# Patient Record
Sex: Female | Born: 1950 | Race: White | Hispanic: No | Smoking: Former smoker
Health system: Southern US, Community
[De-identification: ages and names within clinical notes are randomized; demographics above are authoritative.]

## PROBLEM LIST (undated history)

## (undated) DIAGNOSIS — I951 Orthostatic hypotension: Secondary | ICD-10-CM

## (undated) DIAGNOSIS — F32A Depression, unspecified: Secondary | ICD-10-CM

## (undated) DIAGNOSIS — E785 Hyperlipidemia, unspecified: Secondary | ICD-10-CM

## (undated) DIAGNOSIS — M810 Age-related osteoporosis without current pathological fracture: Secondary | ICD-10-CM

## (undated) DIAGNOSIS — K219 Gastro-esophageal reflux disease without esophagitis: Secondary | ICD-10-CM

## (undated) DIAGNOSIS — I700 Atherosclerosis of aorta: Secondary | ICD-10-CM

## (undated) DIAGNOSIS — D649 Anemia, unspecified: Secondary | ICD-10-CM

## (undated) DIAGNOSIS — F329 Major depressive disorder, single episode, unspecified: Secondary | ICD-10-CM

## (undated) DIAGNOSIS — I779 Disorder of arteries and arterioles, unspecified: Secondary | ICD-10-CM

## (undated) DIAGNOSIS — I10 Essential (primary) hypertension: Secondary | ICD-10-CM

## (undated) DIAGNOSIS — F419 Anxiety disorder, unspecified: Secondary | ICD-10-CM

## (undated) HISTORY — DX: Disorder of arteries and arterioles, unspecified: I77.9

## (undated) HISTORY — DX: Major depressive disorder, single episode, unspecified: F32.9

## (undated) HISTORY — DX: Anxiety disorder, unspecified: F41.9

## (undated) HISTORY — DX: Gastro-esophageal reflux disease without esophagitis: K21.9

## (undated) HISTORY — DX: Essential (primary) hypertension: I10

## (undated) HISTORY — PX: FOOT SURGERY: SHX648

## (undated) HISTORY — PX: CHOLECYSTECTOMY: SHX55

## (undated) HISTORY — DX: Age-related osteoporosis without current pathological fracture: M81.0

## (undated) HISTORY — DX: Atherosclerosis of aorta: I70.0

## (undated) HISTORY — PX: ABDOMINAL HYSTERECTOMY: SHX81

## (undated) HISTORY — DX: Anemia, unspecified: D64.9

## (undated) HISTORY — DX: Hyperlipidemia, unspecified: E78.5

## (undated) HISTORY — DX: Depression, unspecified: F32.A

## (undated) HISTORY — PX: BACK SURGERY: SHX140

## (undated) HISTORY — DX: Orthostatic hypotension: I95.1

---

## 1980-12-20 HISTORY — PX: TRACHEOSTOMY TUBE PLACEMENT: SHX814

## 1998-02-17 ENCOUNTER — Encounter: Admission: RE | Admit: 1998-02-17 | Discharge: 1998-05-18 | Payer: Self-pay | Admitting: Podiatry

## 1998-03-20 ENCOUNTER — Encounter: Admission: RE | Admit: 1998-03-20 | Discharge: 1998-06-18 | Payer: Self-pay | Admitting: Podiatry

## 1999-11-24 ENCOUNTER — Encounter: Payer: Self-pay | Admitting: Gynecology

## 1999-11-24 ENCOUNTER — Encounter: Admission: RE | Admit: 1999-11-24 | Discharge: 1999-11-24 | Payer: Self-pay | Admitting: Gynecology

## 2000-10-18 ENCOUNTER — Other Ambulatory Visit: Admission: RE | Admit: 2000-10-18 | Discharge: 2000-10-18 | Payer: Self-pay | Admitting: Gynecology

## 2001-01-06 ENCOUNTER — Encounter: Admission: RE | Admit: 2001-01-06 | Discharge: 2001-03-09 | Payer: Self-pay | Admitting: Family Medicine

## 2001-10-09 ENCOUNTER — Encounter: Admission: RE | Admit: 2001-10-09 | Discharge: 2001-10-09 | Payer: Self-pay | Admitting: Gynecology

## 2001-10-09 ENCOUNTER — Encounter: Payer: Self-pay | Admitting: Gynecology

## 2001-12-01 ENCOUNTER — Other Ambulatory Visit: Admission: RE | Admit: 2001-12-01 | Discharge: 2001-12-01 | Payer: Self-pay | Admitting: Gynecology

## 2003-05-08 ENCOUNTER — Other Ambulatory Visit: Admission: RE | Admit: 2003-05-08 | Discharge: 2003-05-08 | Payer: Self-pay | Admitting: Gynecology

## 2004-05-20 ENCOUNTER — Other Ambulatory Visit: Admission: RE | Admit: 2004-05-20 | Discharge: 2004-05-20 | Payer: Self-pay | Admitting: Gynecology

## 2004-10-22 ENCOUNTER — Ambulatory Visit: Payer: Self-pay | Admitting: Family Medicine

## 2005-03-31 ENCOUNTER — Ambulatory Visit: Payer: Self-pay | Admitting: Family Medicine

## 2005-04-28 ENCOUNTER — Ambulatory Visit: Payer: Self-pay | Admitting: Family Medicine

## 2005-04-29 ENCOUNTER — Ambulatory Visit (HOSPITAL_COMMUNITY): Admission: RE | Admit: 2005-04-29 | Discharge: 2005-04-29 | Payer: Self-pay | Admitting: Family Medicine

## 2005-05-12 ENCOUNTER — Ambulatory Visit (HOSPITAL_COMMUNITY): Admission: RE | Admit: 2005-05-12 | Discharge: 2005-05-12 | Payer: Self-pay | Admitting: Neurosurgery

## 2005-06-14 ENCOUNTER — Ambulatory Visit: Payer: Self-pay | Admitting: Family Medicine

## 2005-06-24 ENCOUNTER — Inpatient Hospital Stay (HOSPITAL_COMMUNITY): Admission: RE | Admit: 2005-06-24 | Discharge: 2005-06-25 | Payer: Self-pay | Admitting: Neurosurgery

## 2005-10-05 ENCOUNTER — Other Ambulatory Visit: Admission: RE | Admit: 2005-10-05 | Discharge: 2005-10-05 | Payer: Self-pay | Admitting: Gynecology

## 2006-05-30 ENCOUNTER — Ambulatory Visit: Payer: Self-pay | Admitting: Family Medicine

## 2006-08-04 ENCOUNTER — Ambulatory Visit: Payer: Self-pay | Admitting: Family Medicine

## 2006-12-05 ENCOUNTER — Ambulatory Visit: Payer: Self-pay | Admitting: Family Medicine

## 2007-02-10 ENCOUNTER — Other Ambulatory Visit: Admission: RE | Admit: 2007-02-10 | Discharge: 2007-02-10 | Payer: Self-pay | Admitting: Gynecology

## 2010-05-26 ENCOUNTER — Encounter: Admission: RE | Admit: 2010-05-26 | Discharge: 2010-05-26 | Payer: Self-pay | Admitting: Gynecology

## 2010-07-30 ENCOUNTER — Ambulatory Visit (HOSPITAL_BASED_OUTPATIENT_CLINIC_OR_DEPARTMENT_OTHER): Admission: RE | Admit: 2010-07-30 | Discharge: 2010-07-31 | Payer: Self-pay | Admitting: Gynecology

## 2011-03-05 LAB — POCT I-STAT 4, (NA,K, GLUC, HGB,HCT)
Glucose, Bld: 94 mg/dL (ref 70–99)
HCT: 44 % (ref 36.0–46.0)
Hemoglobin: 15 g/dL (ref 12.0–15.0)
Potassium: 3.8 mEq/L (ref 3.5–5.1)
Sodium: 142 mEq/L (ref 135–145)

## 2011-05-07 NOTE — Op Note (Signed)
NAMETONANTZIN, MIMNAUGH               ACCOUNT NO.:  [id number]   MEDICAL RECORD NO.:  [id number]          PATIENT TYPE:  INP   LOCATION:  2899                         FACILITY:  [facility]   PHYSICIAN:  Clydene Fake, M.D.  DATE OF BIRTH:  02-Jan-1951   DATE OF PROCEDURE:  06/24/2005  DATE OF DISCHARGE:                                 OPERATIVE REPORT   PREOPERATIVE DIAGNOSIS:  Herniated nucleus pulposus and spondylosis at C5-6  and C6-7, with left-sided radiculopathy.   POSTOPERATIVE DIAGNOSIS:  Herniated nucleus pulposus and spondylosis at C5-6  and C6-7, with left-sided radiculopathy.   PROCEDURE:  Anterior cervical decompression and diskectomy with fusion at C5-  6 and C6-7 with LifeNet allograft bone at C6-7, a PEEK interbody spacer at  C5-6 with autograft and DBX putty, Eagle anterior cervical plate.   SURGEON:  Clydene Fake, M.D.   ASSISTANT:  Stefani Dama, M.D.   General endotracheal tube anesthesia.   ESTIMATED BLOOD LOSS:  Minimal.   BLOOD GIVEN:  None.   DRAINS:  None.   COMPLICATIONS:  None.   REASON FOR PROCEDURE:  The patient is a 60 year old woman who has been  having neck and left arm pain and numbness.  An MRI was done showing  spondylitic changes with disk bulging and spurs causing foraminal narrowing  and root compression on the left side at both C5-6 and C6-7.  Patient  brought in for decompression and fusion.   PROCEDURE IN DETAIL:  The patient was brought in the operating room and  general anesthesia induced.  The patient was placed in 10 pounds halter  traction and prepped and draped in a sterile fashion.  The site of incision  was made from the midline to the anterior border of the sternocleidomastoid  muscle on the left side, the neck incision taken down to the platysma and  hemostasis obtained with Bovie cauterization.  The platysma was incised with  the Bovie and blunt dissection was taken from the anterior cervical fascia  to the anterior  cervical spine.  A needle was placed in the interspace.  An  x-ray was obtained showing this was the C5-6 interspace.  The disk space was  incised with a 15 blade.  Partial diskectomy was performed with pituitary  rongeurs, and the longus colli muscle was reflected laterally on each side  using the Bovie.  The self-retaining retractor was placed so we could see  the C5-6 and C6-7 interspaces.  The disk space was then again incised with a  15 blade and diskectomy performed with pituitary rongeurs and adenoid  curettes.  Distraction pins were placed in the C5 and C7 interspaces and  distracted.  The diskectomy was then continued with pituitary rongeurs and  curettes.  The microscope was brought for microdissection at this point.  The 1 and then 2 mm Kerrison punches were used to remove posterior disk and  ligament and osteophytes, decompressing the central canal, and then  performing bilaterally foraminotomies both at C5-6 and C6-7.  When we were  finished, we had good central decompression and lateral decompression with  the nerve roots well-decompressed and the nerve root going up the foramen  without any compression.  Hemostasis obtained with Gelfoam and thrombin.  We  measured the height of the interspaces to be 6 mm at C5-6, 5 mm at C6-7.  We  roughed up the cartilaginous end plate with curettes and then tapped in a 5  mm LifeNet allograft bone into the C6-7 space, counter sinking about a  millimeter and checking posterior to the graft, showing there was plenty of  room between bone and dura.  The __________ did not have a 6 mm LifeNet bone  graft, so an [employer] PEEK interbody spacer was used, 6 mm size.  It was  packed with autograft bone that was harvested locally along with DBX putty.  The spacer was then tapped into the interspace at C5-6.  We checked the  posterior graft.  There was plenty of room between dura and cage.  The  distraction pins were removed, hemostasis obtained with  Gelfoam and  thrombin.  The weight was removed from the traction and the interbody  spacers, bone and cage were checked and they were firmly in place.  An Eagle  anterior cervical plate was placed over the anterior cervical spine and two  screws placed into C5, two into C6, two into C7, and these were tightened  down.  Retractors were removed, hemostasis obtained with Gelfoam and  thrombin and bipolar cauterization.  Gelfoam was irrigated out.  We had good  hemostasis, and then platysma was closed with 3-0 Vicryl interrupted suture,  the subcutaneous tissue closed with the same, the skin closed with Benzoin  and Steri-Strips.  Dressing was placed.  The patient was awoken from  anesthesia, was placed in a soft collar and transferred to the recovery  room.  Prior to closing, a lateral x-ray was taken showing good position of  interbody spacers, plate and screws at the C5-6 and C6-7 levels.       JRH/MEDQ  D:  06/24/2005  T:  06/24/2005  Job:  [id number]

## 2011-06-21 ENCOUNTER — Other Ambulatory Visit: Payer: Self-pay | Admitting: Gynecology

## 2011-06-21 DIAGNOSIS — Z1231 Encounter for screening mammogram for malignant neoplasm of breast: Secondary | ICD-10-CM

## 2011-06-25 ENCOUNTER — Ambulatory Visit: Payer: Self-pay

## 2011-07-01 ENCOUNTER — Ambulatory Visit
Admission: RE | Admit: 2011-07-01 | Discharge: 2011-07-01 | Disposition: A | Discharge status: Home / Self Care | Payer: PRIVATE HEALTH INSURANCE | Source: Ambulatory Visit | Attending: Gynecology | Admitting: Gynecology

## 2011-07-01 DIAGNOSIS — Z1231 Encounter for screening mammogram for malignant neoplasm of breast: Secondary | ICD-10-CM

## 2011-07-06 ENCOUNTER — Other Ambulatory Visit: Payer: Self-pay | Admitting: Gynecology

## 2011-07-06 DIAGNOSIS — R928 Other abnormal and inconclusive findings on diagnostic imaging of breast: Secondary | ICD-10-CM

## 2011-07-12 ENCOUNTER — Ambulatory Visit
Admission: RE | Admit: 2011-07-12 | Discharge: 2011-07-12 | Disposition: A | Discharge status: Home / Self Care | Payer: PRIVATE HEALTH INSURANCE | Source: Ambulatory Visit | Attending: Gynecology | Admitting: Gynecology

## 2011-07-12 DIAGNOSIS — R928 Other abnormal and inconclusive findings on diagnostic imaging of breast: Secondary | ICD-10-CM

## 2013-09-28 ENCOUNTER — Other Ambulatory Visit: Payer: Self-pay

## 2013-09-28 ENCOUNTER — Other Ambulatory Visit: Payer: Self-pay | Admitting: Obstetrics and Gynecology

## 2013-09-28 DIAGNOSIS — Z1231 Encounter for screening mammogram for malignant neoplasm of breast: Secondary | ICD-10-CM

## 2013-10-05 ENCOUNTER — Ambulatory Visit
Admission: RE | Admit: 2013-10-05 | Discharge: 2013-10-05 | Disposition: A | Discharge status: Home / Self Care | Payer: 59 | Source: Ambulatory Visit | Attending: Obstetrics and Gynecology | Admitting: Obstetrics and Gynecology

## 2013-10-05 DIAGNOSIS — Z1231 Encounter for screening mammogram for malignant neoplasm of breast: Secondary | ICD-10-CM

## 2014-03-18 ENCOUNTER — Ambulatory Visit (INDEPENDENT_AMBULATORY_CARE_PROVIDER_SITE_OTHER): Payer: No Typology Code available for payment source | Admitting: Family Medicine

## 2014-03-18 ENCOUNTER — Telehealth: Payer: Self-pay | Admitting: Nurse Practitioner

## 2014-03-18 ENCOUNTER — Encounter (INDEPENDENT_AMBULATORY_CARE_PROVIDER_SITE_OTHER): Payer: Self-pay

## 2014-03-18 ENCOUNTER — Encounter: Payer: Self-pay | Admitting: Family Medicine

## 2014-03-18 ENCOUNTER — Encounter: Payer: Self-pay | Admitting: *Deleted

## 2014-03-18 ENCOUNTER — Ambulatory Visit (INDEPENDENT_AMBULATORY_CARE_PROVIDER_SITE_OTHER): Payer: No Typology Code available for payment source

## 2014-03-18 VITALS — BP 149/84 | HR 58 | Temp 98.1°F | Ht 66.5 in | Wt 166.0 lb

## 2014-03-18 DIAGNOSIS — R062 Wheezing: Secondary | ICD-10-CM

## 2014-03-18 DIAGNOSIS — R059 Cough, unspecified: Secondary | ICD-10-CM

## 2014-03-18 DIAGNOSIS — I10 Essential (primary) hypertension: Secondary | ICD-10-CM | POA: Insufficient documentation

## 2014-03-18 DIAGNOSIS — R05 Cough: Secondary | ICD-10-CM

## 2014-03-18 DIAGNOSIS — J209 Acute bronchitis, unspecified: Secondary | ICD-10-CM

## 2014-03-18 DIAGNOSIS — F17200 Nicotine dependence, unspecified, uncomplicated: Secondary | ICD-10-CM

## 2014-03-18 DIAGNOSIS — Z720 Tobacco use: Secondary | ICD-10-CM

## 2014-03-18 DIAGNOSIS — F4323 Adjustment disorder with mixed anxiety and depressed mood: Secondary | ICD-10-CM | POA: Insufficient documentation

## 2014-03-18 LAB — POCT CBC
Granulocyte percent: 66.8 %G (ref 37–80)
HCT, POC: 41.7 % (ref 37.7–47.9)
Hemoglobin: 13.3 g/dL (ref 12.2–16.2)
Lymph, poc: 2.1 (ref 0.6–3.4)
MCH, POC: 28.9 pg (ref 27–31.2)
MCHC: 31.8 g/dL (ref 31.8–35.4)
MCV: 90.7 fL (ref 80–97)
MPV: 7.7 fL (ref 0–99.8)
POC Granulocyte: 5.3 (ref 2–6.9)
POC LYMPH PERCENT: 27.2 %L (ref 10–50)
Platelet Count, POC: 290 10*3/uL (ref 142–424)
RBC: 4.6 M/uL (ref 4.04–5.48)
RDW, POC: 15.1 %
WBC: 7.9 10*3/uL (ref 4.6–10.2)

## 2014-03-18 LAB — POCT INFLUENZA A/B
Influenza A, POC: NEGATIVE
Influenza B, POC: NEGATIVE

## 2014-03-18 LAB — POCT RAPID STREP A (OFFICE): Rapid Strep A Screen: NEGATIVE

## 2014-03-18 MED ORDER — HYDROCOD POLST-CHLORPHEN POLST 10-8 MG/5ML PO LQCR: 5.0000 mL | Freq: Every evening | ORAL | Status: DC | PRN

## 2014-03-18 MED ORDER — ALBUTEROL SULFATE HFA 108 (90 BASE) MCG/ACT IN AERS: 2.0000 | INHALATION_SPRAY | Freq: Four times a day (QID) | RESPIRATORY_TRACT | Status: DC | PRN

## 2014-03-18 MED ORDER — METHYLPREDNISOLONE ACETATE 80 MG/ML IJ SUSP
60.0000 mg | Freq: Once | INTRAMUSCULAR | Status: AC
  Administered 2014-03-18: 60 mg via INTRAMUSCULAR

## 2014-03-18 MED ORDER — AZITHROMYCIN 250 MG PO TABS: ORAL_TABLET | ORAL | Status: DC

## 2014-03-18 MED ORDER — PREDNISONE 10 MG PO TABS
ORAL_TABLET | ORAL | Status: DC
Start: 2014-03-18 — End: 2014-04-11

## 2014-03-18 NOTE — Patient Instructions (Signed)
Drink plenty of fluids Take Tylenol for aches pains and fever Use Mucinex maximum strength, over-the-counter, blue and white in color, with a large glass of water twice daily Reserve codeine cough medicine for bedtime use only Take other medications as directed We will schedule you for a visit with a clinical pharmacist for smoking cessation We will call you with the results of the chest x-ray result as soon as possible

## 2014-03-18 NOTE — Progress Notes (Signed)
Subjective:    Patient ID: Jody Wilson, female    DOB: 02-28-1951, 63 y.o.   MRN: [id number]  HPI Patient here today for cough, congestion, and body aches.       There are no active problems to display for this patient.  Outpatient Encounter Prescriptions as of 03/18/2014  Medication Sig  . ALPRAZolam (XANAX) 0.5 MG tablet Take 0.5 mg by mouth 2 (two) times daily as needed for anxiety.  Marland Kitchen olmesartan-hydrochlorothiazide (BENICAR HCT) 40-25 MG per tablet Take 1 tablet by mouth daily.  Marland Kitchen oxybutynin (DITROPAN-XL) 5 MG 24 hr tablet Take 5 mg by mouth at bedtime.  . sertraline (ZOLOFT) 50 MG tablet Take 50 mg by mouth daily.    Review of Systems  Constitutional: Positive for fever (mostly last week) and chills.  HENT: Positive for congestion. Sore throat: burning throat and chest.   Eyes: Negative.   Respiratory: Positive for cough, chest tightness and shortness of breath.   Cardiovascular: Negative.   Gastrointestinal: Positive for diarrhea.  Endocrine: Negative.   Genitourinary: Negative.   Musculoskeletal: Positive for myalgias.  Skin: Negative.   Allergic/Immunologic: Negative.   Neurological: Positive for light-headedness and headaches.  Hematological: Negative.   Psychiatric/Behavioral: Negative.        Objective:   Physical Exam  Nursing note and vitals reviewed. Constitutional: She is oriented to person, place, and time. She appears well-developed and well-nourished. No distress.  HENT:  Head: Normocephalic and atraumatic.  Right Ear: External ear normal.  Left Ear: External ear normal.  Nose: Nose normal.  Mouth/Throat: Oropharynx is clear and moist.  Eyes: Conjunctivae and EOM are normal. Pupils are equal, round, and reactive to light. Right eye exhibits no discharge. Left eye exhibits no discharge. No scleral icterus.  Neck: Normal range of motion. Neck supple. No thyromegaly present.  Cardiovascular: Normal rate, regular rhythm, normal heart sounds and  intact distal pulses.  Exam reveals no gallop and no friction rub.   No murmur heard. Pulmonary/Chest: Effort normal. No respiratory distress. She has wheezes. She has no rales. She exhibits no tenderness.  Increased coughing with inhalation. Wheezes and rhonchi bilaterally  Abdominal: Soft. Bowel sounds are normal. She exhibits no mass. There is tenderness (slight epigastric tenderness). There is no rebound and no guarding.  Musculoskeletal: Normal range of motion. She exhibits no edema and no tenderness.  Lymphadenopathy:    She has no cervical adenopathy.  Neurological: She is alert and oriented to person, place, and time.  Skin: Skin is warm and dry. No rash noted.  Psychiatric: She has a normal mood and affect. Her behavior is normal. Judgment and thought content normal.   BP 149/84  Pulse 58  Temp(Src) 98.1 F (36.7 C) (Oral)  Ht 5' 6.5" (1.689 m)  Wt 166 lb (75.297 kg)  BMI 26.39 kg/m2 Results for orders placed in visit on 03/18/14  POCT CBC      Result Value Ref Range   WBC 7.9  4.6 - 10.2 K/uL   Lymph, poc 2.1  0.6 - 3.4   POC LYMPH PERCENT 27.2  10 - 50 %L   MID (cbc)    0 - 0.9   POC MID %    0 - 12 %M   POC Granulocyte 5.3  2 - 6.9   Granulocyte percent 66.8  37 - 80 %G   RBC 4.6  4.04 - 5.48 M/uL   Hemoglobin 13.3  12.2 - 16.2 g/dL   HCT, POC 41.7  37.7 -  47.9 %   MCV 90.7  80 - 97 fL   MCH, POC 28.9  27 - 31.2 pg   MCHC 31.8  31.8 - 35.4 g/dL   RDW, POC 15.1     Platelet Count, POC 290.0  142 - 424 K/uL   MPV 7.7  0 - 99.8 fL  POCT INFLUENZA A/B      Result Value Ref Range   Influenza A, POC Negative     Influenza B, POC Negative    POCT RAPID STREP A (OFFICE)      Result Value Ref Range   Rapid Strep A Screen Negative  Negative   [facility] reading (PRIMARY) by  Dr. Brunilda Payor x-ray--no active disease                                          Assessment & Plan:  1. Cough - POCT CBC - DG Chest 2 View; Future - POCT Influenza A/B - POCT rapid strep  A - albuterol (PROVENTIL HFA;VENTOLIN HFA) 108 (90 BASE) MCG/ACT inhaler; Inhale 2 puffs into the lungs every 6 (six) hours as needed for wheezing or shortness of breath.  Dispense: 1 Inhaler; Refill: 2 - chlorpheniramine-HYDROcodone (TUSSIONEX PENNKINETIC ER) 10-8 MG/5ML LQCR; Take 5 mLs by mouth at bedtime as needed for cough.  Dispense: 120 mL; Refill: 0 - predniSONE (DELTASONE) 10 MG tablet; 1 tablet 4 times a day for 2 days,  1 tablet 3 times a day for 2 days,  1 tablet 2 times a day for 2 days, 1 tablet daily for 2 days  Dispense: 20 tablet; Refill: 0 - methylPREDNISolone acetate (DEPO-MEDROL) injection 60 mg; Inject 0.75 mLs (60 mg total) into the muscle once.  2. Adjustment disorder with mixed anxiety and depressed mood  3. Hypertension  4. Acute bronchitis - azithromycin (ZITHROMAX) 250 MG tablet; 2 tabs the first day then one daily for infection until completed  Dispense: 6 tablet; Refill: 0 - predniSONE (DELTASONE) 10 MG tablet; 1 tablet 4 times a day for 2 days,  1 tablet 3 times a day for 2 days,  1 tablet 2 times a day for 2 days, 1 tablet daily for 2 days  Dispense: 20 tablet; Refill: 0  5. Wheezing - albuterol (PROVENTIL HFA;VENTOLIN HFA) 108 (90 BASE) MCG/ACT inhaler; Inhale 2 puffs into the lungs every 6 (six) hours as needed for wheezing or shortness of breath.  Dispense: 1 Inhaler; Refill: 2 - predniSONE (DELTASONE) 10 MG tablet; 1 tablet 4 times a day for 2 days,  1 tablet 3 times a day for 2 days,  1 tablet 2 times a day for 2 days, 1 tablet daily for 2 days  Dispense: 20 tablet; Refill: 0 - methylPREDNISolone acetate (DEPO-MEDROL) injection 60 mg; Inject 0.75 mLs (60 mg total) into the muscle once.  6. Nicotine abuse -Appointment with the clinical pharmacist  Patient was told to remain out of work for at least the next 3 days in order to start feeling better. Patient Instructions  Drink plenty of fluids Take Tylenol for aches pains and fever Use Mucinex  maximum strength, over-the-counter, blue and white in color, with a large glass of water twice daily Reserve codeine cough medicine for bedtime use only Take other medications as directed We will schedule you for a visit with a clinical pharmacist for smoking cessation We will call you with the results of the  chest x-ray result as soon as possible   Arrie Senate MD

## 2014-03-18 NOTE — Telephone Encounter (Signed)
appt at 10:30 with moore

## 2014-03-30 ENCOUNTER — Encounter: Payer: Self-pay | Admitting: Nurse Practitioner

## 2014-03-30 ENCOUNTER — Ambulatory Visit (INDEPENDENT_AMBULATORY_CARE_PROVIDER_SITE_OTHER): Payer: No Typology Code available for payment source | Admitting: Nurse Practitioner

## 2014-03-30 VITALS — BP 141/76 | HR 79 | Temp 98.4°F | Ht 66.5 in | Wt 166.2 lb

## 2014-03-30 DIAGNOSIS — R300 Dysuria: Secondary | ICD-10-CM

## 2014-03-30 DIAGNOSIS — R309 Painful micturition, unspecified: Secondary | ICD-10-CM

## 2014-03-30 DIAGNOSIS — N390 Urinary tract infection, site not specified: Secondary | ICD-10-CM

## 2014-03-30 DIAGNOSIS — B370 Candidal stomatitis: Secondary | ICD-10-CM

## 2014-03-30 LAB — POCT URINALYSIS DIPSTICK
Bilirubin, UA: NEGATIVE
Glucose, UA: NEGATIVE
Ketones, UA: NEGATIVE
Nitrite, UA: NEGATIVE
Spec Grav, UA: 1.02
Urobilinogen, UA: NEGATIVE
pH, UA: 6

## 2014-03-30 LAB — POCT UA - MICROSCOPIC ONLY
Casts, Ur, LPF, POC: NEGATIVE
Crystals, Ur, HPF, POC: NEGATIVE
Yeast, UA: NEGATIVE

## 2014-03-30 MED ORDER — NITROFURANTOIN MONOHYD MACRO 100 MG PO CAPS: 100.0000 mg | ORAL_CAPSULE | Freq: Two times a day (BID) | ORAL | Status: DC

## 2014-03-30 MED ORDER — NYSTATIN 100000 UNIT/ML MT SUSP: 5.0000 mL | Freq: Four times a day (QID) | OROMUCOSAL | Status: DC

## 2014-03-30 NOTE — Patient Instructions (Signed)
Thrush, Adult  Thrush, also called oral candidiasis, is a fungal infection that develops in the mouth and throat and on the tongue. It causes white patches to form on the mouth and tongue. Thrush is most common in older adults, but it can occur at any age.  Many cases of thrush are mild, but this infection can also be more serious. Thrush can be a recurring problem for people who have chronic illnesses or who take medicines that limit the body's ability to fight infection. Because these people have difficulty fighting infections, the fungus that causes thrush can spread throughout the body. This can cause life-threatening blood or organ infections. CAUSES  Thrush is usually caused by a yeast called Candida albicans. This fungus is normally present in small amounts in the mouth and on other mucous membranes. It usually causes no harm. However, when conditions are present that allow the fungus to grow uncontrolled, it invades surrounding tissues and becomes an infection. Less often, other Candida species can also lead to thrush.  RISK FACTORS Thrush is more likely to develop in the following people:  People with an impaired ability to fight infection (weakened immune system).   Older adults.   People with HIV.   People with diabetes.   People with dry mouth (xerostomia).   Pregnant women.   People with poor dental care, especially those who have false teeth.   People who use antibiotic medicines.  SIGNS AND SYMPTOMS  Thrush can be a mild infection that causes no symptoms. If symptoms develop, they may include:   A burning feeling in the mouth and throat. This can occur at the start of a thrush infection.   White patches that adhere to the mouth and tongue. The tissue around the patches may be red, raw, and painful. If rubbed (during tooth brushing, for example), the patches and the tissue of the mouth may bleed easily.   A bad taste in the mouth or difficulty tasting foods.    Cottony feeling in the mouth.   Pain during eating and swallowing. DIAGNOSIS  Your health care provider can usually diagnose thrush by looking in your mouth and asking you questions about your health.  TREATMENT  Medicines that help prevent the growth of fungi (antifungals) are the standard treatment for thrush. These medicines are either applied directly to the affected area (topical) or swallowed (oral). The treatment will depend on the severity of the condition.  Mild Thrush Mild cases of thrush may clear up with the use of an antifungal mouth rinse or lozenges. Treatment usually lasts about 14 days.  Moderate to Severe Thrush  More severe thrush infections that have spread to the esophagus are treated with an oral antifungal medicine. A topical antifungal medicine may also be used.   For some severe infections, a treatment period longer than 14 days may be needed.   Oral antifungal medicines are almost never used during pregnancy because the fetus may be harmed. However, if a pregnant woman has a rare, severe thrush infection that has spread to her blood, oral antifungal medicines may be used. In this case, the risk of harm to the mother and fetus from the severe thrush infection may be greater than the risk posed by the use of antifungal medicines.  Persistent or Recurrent Thrush For cases of thrush that do not go away or keep coming back, treatment may involve the following:   Treatment may be needed twice as long as the symptoms last.   Treatment will   include both oral and topical antifungal medicines.   People with weakened immune systems can take an antifungal medicine on a continuous basis to prevent thrush infections.  It is important to treat conditions that make you more likely to get thrush, such as diabetes or HIV.  HOME CARE INSTRUCTIONS   Only take over-the-counter or prescription medicine as directed by your health care provider. Talk to your health care  provider about an over-the-counter medicine called gentian violet, which kills bacteria and fungi.   Eat plain, unflavored yogurt as directed by your health care provider. Check the label to make sure the yogurt contains live cultures. This yogurt can help healthy bacteria grow in the mouth that can stop the growth of the fungus that causes thrush.   Try these measures to help reduce the discomfort of thrush:   Drink cold liquids such as water or iced tea.   Try flavored ice treats or frozen juices.   Eat foods that are easy to swallow, such as gelatin, ice cream, or custard.   If the patches in your mouth are painful, try drinking from a straw.   Rinse your mouth several times a day with a warm saltwater rinse. You can make the saltwater mixture with 1 tsp (6 g) of salt in 8 fl oz (0.2 L) of warm water.   If you wear dentures, remove the dentures before going to bed, brush them vigorously, and soak them in a cleaning solution as directed by your health care provider.   Women who are breastfeeding should clean their nipples with an antifungal medicine as directed by their health care provider. Dry the nipples after breastfeeding. Applying lanolin-containing body lotion may help relieve nipple soreness.  SEEK MEDICAL CARE IF:  Your symptoms are getting worse or are not improving within 7 days of starting treatment.   You have symptoms of spreading infection, such as white patches on the skin outside of the mouth.   You are nursing and you have redness, burning, or pain in the nipples that is not relieved with treatment.  MAKE SURE YOU:  Understand these instructions.  Will watch your condition.  Will get help right away if you are not doing well or get worse. Document Released: 08/31/2004 Document Revised: 09/26/2013 Document Reviewed: 07/09/2013 [employer] Patient Information 2014 [employer].  

## 2014-03-30 NOTE — Progress Notes (Signed)
   Subjective:    Patient ID: Jody Wilson, female    DOB: [birth year], 63 y.o.   MRN: [id number]  HPI Patient in c/o dysuria and urinary frequency with scant amounts- started 2 days ago. * Patient was treated for bronchitis with steroid and antibiotic and over the last week she has a raw tongue with metallic taste in mouth.    Review of Systems  Constitutional: Negative for fever.  HENT: Negative.   Respiratory: Negative.   Cardiovascular: Negative.   Genitourinary: Positive for dysuria, urgency and frequency.  Neurological: Negative.   Psychiatric/Behavioral: Negative.   All other systems reviewed and are negative.      Objective:   Physical Exam  Constitutional: She is oriented to person, place, and time. She appears well-developed and well-nourished.  HENT:  White film on tongue  Eyes: Pupils are equal, round, and reactive to light.  Neck: Normal range of motion. Neck supple.  Cardiovascular: Normal rate and normal heart sounds.   Pulmonary/Chest: Effort normal and breath sounds normal.  Abdominal: Soft. There is tenderness (suprapubic pain on palpation).  Genitourinary:  NO CVA tenderness  Neurological: She is alert and oriented to person, place, and time.  Skin: Skin is warm and dry.  Psychiatric: She has a normal mood and affect. Her behavior is normal. Judgment and thought content normal.   BP 141/76  Pulse 79  Temp(Src) 98.4 F (36.9 C) (Oral)  Ht 5' 6.5" (1.689 m)  Wt 166 lb 3.2 oz (75.388 kg)  BMI 26.43 kg/m2        Assessment & Plan:   1. Painful urination   2. UTI (urinary tract infection)   3. Oral pharyngeal candidiasis    Orders Placed This Encounter  Procedures  . Urine culture  . POCT urinalysis dipstick  . POCT UA - Microscopic Only    Meds ordered this encounter  Medications  . nitrofurantoin, macrocrystal-monohydrate, (MACROBID) 100 MG capsule    Sig: Take 1 capsule (100 mg total) by mouth 2 (two) times daily.    Dispense:  14  capsule    Refill:  0    Order Specific Question:  Supervising Provider    Answer:  Chipper Herb [[id number]]  . nystatin (MYCOSTATIN) 100000 UNIT/ML suspension    Sig: Take 5 mLs (500,000 Units total) by mouth 4 (four) times daily.    Dispense:  120 mL    Refill:  0    Order Specific Question:  Supervising Provider    Answer:  Chipper Herb [[id number]]   Force fluids AZO over the counter X2 days RTO prn Culture pending Eat yogurt  Mary-Margaret Hassell Done, FNP

## 2014-04-02 ENCOUNTER — Telehealth: Payer: Self-pay | Admitting: Nurse Practitioner

## 2014-04-03 LAB — URINE CULTURE

## 2014-04-03 NOTE — Telephone Encounter (Signed)
Culture was sent out on Monday 04/01/14 and it will take several days before the results are available.  Patient made aware that we will call when the results are available.  She asked that we leave the results on her voicemail if she isn't available.

## 2014-04-04 ENCOUNTER — Telehealth: Payer: Self-pay | Admitting: *Deleted

## 2014-04-04 ENCOUNTER — Telehealth: Payer: Self-pay | Admitting: Nurse Practitioner

## 2014-04-04 NOTE — Telephone Encounter (Signed)
Message copied by Shelbie Ammons on Thu Apr 04, 2014 10:11 AM ------      Message from: Chevis Pretty      Created: Wed Apr 03, 2014  3:06 PM       Patient given macrobid- good coverage      Proper hygiene!! ------

## 2014-04-04 NOTE — Telephone Encounter (Signed)
Left details on vm.  Call with question.

## 2014-04-04 NOTE — Telephone Encounter (Signed)
Pt aware and verbalized understanding.  

## 2014-04-10 ENCOUNTER — Telehealth: Payer: Self-pay | Admitting: Nurse Practitioner

## 2014-04-10 NOTE — Telephone Encounter (Signed)
Finished Macrobid on 04/06/14.  She developed diffuse rash on 04/08/14. Minimal pruritis.  On 4/21 she had difficulty urinating first thing in the morning and noticed gross hematuria the first two episodes. She also had lower abd pressure. It has since resolved.   I wouldn't think the rash would be a reaction to the Macrobid since it started 3 days after finishing the medication. Appt scheduled for evaluation tomorrow afternoon. Patient agreed to plan.

## 2014-04-11 ENCOUNTER — Ambulatory Visit (INDEPENDENT_AMBULATORY_CARE_PROVIDER_SITE_OTHER): Payer: No Typology Code available for payment source | Admitting: Nurse Practitioner

## 2014-04-11 ENCOUNTER — Encounter: Payer: Self-pay | Admitting: Nurse Practitioner

## 2014-04-11 VITALS — BP 140/78 | HR 68 | Temp 97.9°F | Ht 66.5 in | Wt 165.0 lb

## 2014-04-11 DIAGNOSIS — L500 Allergic urticaria: Secondary | ICD-10-CM

## 2014-04-11 DIAGNOSIS — R350 Frequency of micturition: Secondary | ICD-10-CM

## 2014-04-11 DIAGNOSIS — I10 Essential (primary) hypertension: Secondary | ICD-10-CM

## 2014-04-11 LAB — POCT UA - MICROSCOPIC ONLY
Bacteria, U Microscopic: NEGATIVE
Casts, Ur, LPF, POC: NEGATIVE
Crystals, Ur, HPF, POC: NEGATIVE
Mucus, UA: NEGATIVE
RBC, urine, microscopic: NEGATIVE
Yeast, UA: NEGATIVE

## 2014-04-11 LAB — POCT URINALYSIS DIPSTICK
Bilirubin, UA: NEGATIVE
Blood, UA: NEGATIVE
Glucose, UA: NEGATIVE
Ketones, UA: NEGATIVE
Leukocytes, UA: NEGATIVE
Nitrite, UA: NEGATIVE
Protein, UA: NEGATIVE
Spec Grav, UA: 1.015
Urobilinogen, UA: NEGATIVE
pH, UA: 6

## 2014-04-11 MED ORDER — NYSTATIN 100000 UNIT/ML MT SUSP: 5.0000 mL | Freq: Four times a day (QID) | OROMUCOSAL | Status: DC

## 2014-04-11 MED ORDER — METHYLPREDNISOLONE ACETATE 80 MG/ML IJ SUSP
80.0000 mg | Freq: Once | INTRAMUSCULAR | Status: AC
  Administered 2014-04-11: 80 mg via INTRAMUSCULAR

## 2014-04-11 MED ORDER — OLMESARTAN MEDOXOMIL-HCTZ 20-12.5 MG PO TABS: 1.0000 | ORAL_TABLET | Freq: Every day | ORAL | Status: DC

## 2014-04-11 NOTE — Progress Notes (Signed)
   Subjective:    Patient ID: Jody Wilson, female    DOB: Aug 30, 1951, 63 y.o.   MRN: [id number]  HPI Patient said that she was passing blood in her urine on Tuesday with some pressure- she was on antibiotic over 1 week ago for UTI- now symptoms are back.  * Tongue feels like it has a film on it. * GYN started her on hyzaar because insurance will not pay for benicar- since starting hyzaar she is breaking out in a rash and feet swelling.    Review of Systems  Constitutional: Negative.   HENT: Negative.   Respiratory: Negative.   Cardiovascular: Negative.   Gastrointestinal: Negative.   Genitourinary: Positive for dysuria, frequency and hematuria.  Neurological: Negative.   Hematological: Negative.   Psychiatric/Behavioral: Negative.   All other systems reviewed and are negative.      Objective:   Physical Exam  Constitutional: She appears well-developed and well-nourished.  HENT:  White film on tongue  Cardiovascular: Normal rate, regular rhythm and normal heart sounds.   Pulmonary/Chest: Effort normal and breath sounds normal.  Skin:  Fine erythematous rash on bil legs and feet with slight swelling of feet.  Psychiatric: She has a normal mood and affect. Her behavior is normal. Judgment and thought content normal.   Results for orders placed in visit on 04/11/14  POCT URINALYSIS DIPSTICK      Result Value Ref Range   Color, UA yellow     Clarity, UA cloudy     Glucose, UA neg     Bilirubin, UA neg     Ketones, UA neg     Spec Grav, UA 1.015     Blood, UA neg     pH, UA 6.0     Protein, UA neg     Urobilinogen, UA negative     Nitrite, UA neg     Leukocytes, UA Negative    POCT UA - MICROSCOPIC ONLY      Result Value Ref Range   WBC, Ur, HPF, POC 15-20     RBC, urine, microscopic neg     Bacteria, U Microscopic neg     Mucus, UA neg     Epithelial cells, urine per micros occ     Crystals, Ur, HPF, POC neg     Casts, Ur, LPF, POC neg     Yeast, UA neg             Assessment & Plan:  1. Frequent urination Urine clear - POCT urinalysis dipstick - POCT UA - Microscopic Only  2. Hypertension benicar 20.12.5 1 po qd- stay on that until samples get close to running out- then may try diovan.  3. Allergic urticaria Benadryl if itching  - methylPREDNISolone acetate (DEPO-MEDROL) injection 80 mg; Inject 1 mL (80 mg total) into the muscle once.  4. Thrush Nystatin swich an swallow  Mary-Margaret Hassell Done, FNP

## 2014-04-11 NOTE — Patient Instructions (Signed)
Thrush, Adult  Thrush, also called oral candidiasis, is a fungal infection that develops in the mouth and throat and on the tongue. It causes white patches to form on the mouth and tongue. Thrush is most common in older adults, but it can occur at any age.  Many cases of thrush are mild, but this infection can also be more serious. Thrush can be a recurring problem for people who have chronic illnesses or who take medicines that limit the body's ability to fight infection. Because these people have difficulty fighting infections, the fungus that causes thrush can spread throughout the body. This can cause life-threatening blood or organ infections. CAUSES  Thrush is usually caused by a yeast called Candida albicans. This fungus is normally present in small amounts in the mouth and on other mucous membranes. It usually causes no harm. However, when conditions are present that allow the fungus to grow uncontrolled, it invades surrounding tissues and becomes an infection. Less often, other Candida species can also lead to thrush.  RISK FACTORS Thrush is more likely to develop in the following people:  People with an impaired ability to fight infection (weakened immune system).   Older adults.   People with HIV.   People with diabetes.   People with dry mouth (xerostomia).   Pregnant women.   People with poor dental care, especially those who have false teeth.   People who use antibiotic medicines.  SIGNS AND SYMPTOMS  Thrush can be a mild infection that causes no symptoms. If symptoms develop, they may include:   A burning feeling in the mouth and throat. This can occur at the start of a thrush infection.   White patches that adhere to the mouth and tongue. The tissue around the patches may be red, raw, and painful. If rubbed (during tooth brushing, for example), the patches and the tissue of the mouth may bleed easily.   A bad taste in the mouth or difficulty tasting foods.    Cottony feeling in the mouth.   Pain during eating and swallowing. DIAGNOSIS  Your health care provider can usually diagnose thrush by looking in your mouth and asking you questions about your health.  TREATMENT  Medicines that help prevent the growth of fungi (antifungals) are the standard treatment for thrush. These medicines are either applied directly to the affected area (topical) or swallowed (oral). The treatment will depend on the severity of the condition.  Mild Thrush Mild cases of thrush may clear up with the use of an antifungal mouth rinse or lozenges. Treatment usually lasts about 14 days.  Moderate to Severe Thrush  More severe thrush infections that have spread to the esophagus are treated with an oral antifungal medicine. A topical antifungal medicine may also be used.   For some severe infections, a treatment period longer than 14 days may be needed.   Oral antifungal medicines are almost never used during pregnancy because the fetus may be harmed. However, if a pregnant woman has a rare, severe thrush infection that has spread to her blood, oral antifungal medicines may be used. In this case, the risk of harm to the mother and fetus from the severe thrush infection may be greater than the risk posed by the use of antifungal medicines.  Persistent or Recurrent Thrush For cases of thrush that do not go away or keep coming back, treatment may involve the following:   Treatment may be needed twice as long as the symptoms last.   Treatment will   include both oral and topical antifungal medicines.   People with weakened immune systems can take an antifungal medicine on a continuous basis to prevent thrush infections.  It is important to treat conditions that make you more likely to get thrush, such as diabetes or HIV.  HOME CARE INSTRUCTIONS   Only take over-the-counter or prescription medicine as directed by your health care provider. Talk to your health care  provider about an over-the-counter medicine called gentian violet, which kills bacteria and fungi.   Eat plain, unflavored yogurt as directed by your health care provider. Check the label to make sure the yogurt contains live cultures. This yogurt can help healthy bacteria grow in the mouth that can stop the growth of the fungus that causes thrush.   Try these measures to help reduce the discomfort of thrush:   Drink cold liquids such as water or iced tea.   Try flavored ice treats or frozen juices.   Eat foods that are easy to swallow, such as gelatin, ice cream, or custard.   If the patches in your mouth are painful, try drinking from a straw.   Rinse your mouth several times a day with a warm saltwater rinse. You can make the saltwater mixture with 1 tsp (6 g) of salt in 8 fl oz (0.2 L) of warm water.   If you wear dentures, remove the dentures before going to bed, brush them vigorously, and soak them in a cleaning solution as directed by your health care provider.   Women who are breastfeeding should clean their nipples with an antifungal medicine as directed by their health care provider. Dry the nipples after breastfeeding. Applying lanolin-containing body lotion may help relieve nipple soreness.  SEEK MEDICAL CARE IF:  Your symptoms are getting worse or are not improving within 7 days of starting treatment.   You have symptoms of spreading infection, such as white patches on the skin outside of the mouth.   You are nursing and you have redness, burning, or pain in the nipples that is not relieved with treatment.  MAKE SURE YOU:  Understand these instructions.  Will watch your condition.  Will get help right away if you are not doing well or get worse. Document Released: 08/31/2004 Document Revised: 09/26/2013 Document Reviewed: 07/09/2013 [employer] Patient Information 2014 [employer].  

## 2014-04-18 ENCOUNTER — Ambulatory Visit: Payer: No Typology Code available for payment source

## 2014-10-15 ENCOUNTER — Telehealth: Payer: Self-pay | Admitting: Nurse Practitioner

## 2014-10-15 NOTE — Telephone Encounter (Signed)
Appointment given for Thursday with Ronnald Collum

## 2014-10-17 ENCOUNTER — Ambulatory Visit (INDEPENDENT_AMBULATORY_CARE_PROVIDER_SITE_OTHER): Payer: No Typology Code available for payment source | Admitting: Nurse Practitioner

## 2014-10-17 ENCOUNTER — Encounter (INDEPENDENT_AMBULATORY_CARE_PROVIDER_SITE_OTHER): Payer: Self-pay

## 2014-10-17 ENCOUNTER — Encounter: Payer: Self-pay | Admitting: Nurse Practitioner

## 2014-10-17 VITALS — BP 173/86 | HR 63 | Temp 99.1°F | Ht 66.5 in | Wt 168.6 lb

## 2014-10-17 DIAGNOSIS — I10 Essential (primary) hypertension: Secondary | ICD-10-CM

## 2014-10-17 DIAGNOSIS — F4323 Adjustment disorder with mixed anxiety and depressed mood: Secondary | ICD-10-CM

## 2014-10-17 MED ORDER — OLMESARTAN MEDOXOMIL-HCTZ 20-12.5 MG PO TABS: 1.0000 | ORAL_TABLET | Freq: Every day | ORAL | Status: DC

## 2014-10-17 NOTE — Progress Notes (Signed)
   Subjective:    Patient ID: Jody Wilson, female    DOB: [birth year], 63 y.o.   MRN: [id number]  Patient is here for chronic visit follow up. She reports she has not taken her benicar for over 2 weeks.   Hypertension This is a chronic problem. The current episode started more than 1 year ago. There are no associated agents to hypertension. Risk factors for coronary artery disease include post-menopausal state and smoking/tobacco exposure. Past treatments include diuretics and angiotensin blockers. The current treatment provides mild (She has stop taking her medicine. ) improvement. There are no compliance problems.   ANXIETY:  Taking xanax and zoloft and  is doing well.  GERD: Taking omeprazole OTC.   Review of Systems  All other systems reviewed and are negative.      Objective:   Physical Exam  Constitutional: She is oriented to person, place, and time. She appears well-developed and well-nourished.  HENT:  Head: Normocephalic.  Eyes: Pupils are equal, round, and reactive to light.  Neck: Normal range of motion.  Cardiovascular: Normal rate.   Pulmonary/Chest: Effort normal and breath sounds normal.  Musculoskeletal: Normal range of motion.  Neurological: She is alert and oriented to person, place, and time.  Skin: Skin is warm.  Psychiatric: She has a normal mood and affect. Her behavior is normal.    BP 173/86  Pulse 63  Temp(Src) 99.1 F (37.3 C) (Oral)  Ht 5' 6.5" (1.689 m)  Wt 168 lb 9.6 oz (76.476 kg)  BMI 26.81 kg/m2       Assessment & Plan:  1. Essential hypertension Low Na+ diet - NMR, lipoprofile - CMP14+EGFR - olmesartan-hydrochlorothiazide (BENICAR HCT) 20-12.5 MG per tablet; Take 1 tablet by mouth daily.  Dispense: 30 tablet; Refill: 5  2. Adjustment disorder with mixed anxiety and depressed mood Doing well on zoloft and xanax    Labs pending Health maintenance reviewed Diet and exercise encouraged Continue all meds Follow up  In 3 months    [place], FNP

## 2014-10-17 NOTE — Patient Instructions (Signed)
Stress and Stress Management Stress is a normal reaction to life events. It is what you feel when life demands more than you are used to or more than you can handle. Some stress can be useful. For example, the stress reaction can help you catch the last bus of the day, study for a test, or meet a deadline at work. But stress that occurs too often or for too long can cause problems. It can affect your emotional health and interfere with relationships and normal daily activities. Too much stress can weaken your immune system and increase your risk for physical illness. If you already have a medical problem, stress can make it worse. CAUSES  All sorts of life events may cause stress. An event that causes stress for one person may not be stressful for another person. Major life events commonly cause stress. These may be positive or negative. Examples include losing your job, moving into a new home, getting married, having a baby, or losing a loved one. Less obvious life events may also cause stress, especially if they occur day after day or in combination. Examples include working long hours, driving in traffic, caring for children, being in debt, or being in a difficult relationship. SIGNS AND SYMPTOMS Stress may cause emotional symptoms including, the following:  Anxiety. This is feeling worried, afraid, on edge, overwhelmed, or out of control.  Anger. This is feeling irritated or impatient.  Depression. This is feeling sad, down, helpless, or guilty.  Difficulty focusing, remembering, or making decisions. Stress may cause physical symptoms, including the following:   Aches and pains. These may affect your head, neck, back, stomach, or other areas of your body.  Tight muscles or clenched jaw.  Low energy or trouble sleeping. Stress may cause unhealthy behaviors, including the following:   Eating to feel better (overeating) or skipping meals.  Sleeping too little, too much, or both.  Working  too much or putting off tasks (procrastination).  Smoking, drinking alcohol, or using drugs to feel better. DIAGNOSIS  Stress is diagnosed through an assessment by your health care provider. Your health care provider will ask questions about your symptoms and any stressful life events.Your health care provider will also ask about your medical history and may order blood tests or other tests. Certain medical conditions and medicine can cause physical symptoms similar to stress. Mental illness can cause emotional symptoms and unhealthy behaviors similar to stress. Your health care provider may refer you to a mental health professional for further evaluation.  TREATMENT  Stress management is the recommended treatment for stress.The goals of stress management are reducing stressful life events and coping with stress in healthy ways.  Techniques for reducing stressful life events include the following:  Stress identification. Self-monitor for stress and identify what causes stress for you. These skills may help you to avoid some stressful events.  Time management. Set your priorities, keep a calendar of events, and learn to say "no." These tools can help you avoid making too many commitments. Techniques for coping with stress include the following:  Rethinking the problem. Try to think realistically about stressful events rather than ignoring them or overreacting. Try to find the positives in a stressful situation rather than focusing on the negatives.  Exercise. Physical exercise can release both physical and emotional tension. The key is to find a form of exercise you enjoy and do it regularly.  Relaxation techniques. These relax the body and mind. Examples include yoga, meditation, tai chi, biofeedback, deep  breathing, progressive muscle relaxation, listening to music, being out in nature, journaling, and other hobbies. Again, the key is to find one or more that you enjoy and can do  regularly.  Healthy lifestyle. Eat a balanced diet, get plenty of sleep, and do not smoke. Avoid using alcohol or drugs to relax.  Strong support network. Spend time with family, friends, or other people you enjoy being around.Express your feelings and talk things over with someone you trust. Counseling or talktherapy with a mental health professional may be helpful if you are having difficulty managing stress on your own. Medicine is typically not recommended for the treatment of stress.Talk to your health care provider if you think you need medicine for symptoms of stress. HOME CARE INSTRUCTIONS  Keep all follow-up visits as directed by your health care provider.  Take all medicines as directed by your health care provider. SEEK MEDICAL CARE IF:  Your symptoms get worse or you start having new symptoms.  You feel overwhelmed by your problems and can no longer manage them on your own. SEEK IMMEDIATE MEDICAL CARE IF:  You feel like hurting yourself or someone else. Document Released: 06/01/2001 Document Revised: 04/22/2014 Document Reviewed: 07/31/2013 Exit[employer] Patient Information 2015 [employer]. This information is not intended to replace advice given to you by your health care provider. Make sure you discuss any questions you have with your health care provider.  

## 2014-10-18 LAB — NMR, LIPOPROFILE
Cholesterol: 211 mg/dL — ABNORMAL HIGH (ref 100–199)
HDL Cholesterol by NMR: 47 mg/dL (ref >39)
HDL Particle Number: 30.2 umol/L — ABNORMAL LOW (ref >=30.5)
LDL Particle Number: 1689 nmol/L — ABNORMAL HIGH (ref <1000)
LDL Size: 20.8 nm (ref >20.5)
LDL-C: 125 mg/dL — ABNORMAL HIGH (ref 0–99)
LP-IR Score: 46 — ABNORMAL HIGH (ref <=45)
Small LDL Particle Number: 707 nmol/L — ABNORMAL HIGH (ref <=527)
Triglycerides by NMR: 193 mg/dL — ABNORMAL HIGH (ref 0–149)

## 2014-10-18 LAB — CMP14+EGFR
ALT: 11 IU/L (ref 0–32)
AST: 14 IU/L (ref 0–40)
Albumin/Globulin Ratio: 1.6 (ref 1.1–2.5)
Albumin: 4.4 g/dL (ref 3.6–4.8)
Alkaline Phosphatase: 103 IU/L (ref 39–117)
BUN/Creatinine Ratio: 29 — ABNORMAL HIGH (ref 11–26)
BUN: 20 mg/dL (ref 8–27)
CO2: 25 mmol/L (ref 18–29)
Calcium: 9.7 mg/dL (ref 8.7–10.3)
Chloride: 100 mmol/L (ref 97–108)
Creatinine, Ser: 0.69 mg/dL (ref 0.57–1.00)
GFR calc Af Amer: 108 mL/min/{1.73_m2} (ref >59)
GFR calc non Af Amer: 94 mL/min/{1.73_m2} (ref >59)
Globulin, Total: 2.7 g/dL (ref 1.5–4.5)
Glucose: 96 mg/dL (ref 65–99)
Potassium: 4.1 mmol/L (ref 3.5–5.2)
Sodium: 140 mmol/L (ref 134–144)
Total Bilirubin: 0.4 mg/dL (ref 0.0–1.2)
Total Protein: 7.1 g/dL (ref 6.0–8.5)

## 2014-10-21 ENCOUNTER — Other Ambulatory Visit: Payer: Self-pay | Admitting: Nurse Practitioner

## 2014-10-21 DIAGNOSIS — I10 Essential (primary) hypertension: Secondary | ICD-10-CM

## 2014-10-21 MED ORDER — OLMESARTAN MEDOXOMIL-HCTZ 20-12.5 MG PO TABS: 1.0000 | ORAL_TABLET | Freq: Every day | ORAL | Status: DC

## 2014-10-21 MED ORDER — ATORVASTATIN CALCIUM 40 MG PO TABS: 40.0000 mg | ORAL_TABLET | Freq: Every day | ORAL | Status: DC

## 2014-11-26 ENCOUNTER — Other Ambulatory Visit (HOSPITAL_COMMUNITY): Payer: Self-pay | Admitting: Obstetrics and Gynecology

## 2014-11-26 DIAGNOSIS — Z87891 Personal history of nicotine dependence: Secondary | ICD-10-CM

## 2014-11-27 ENCOUNTER — Ambulatory Visit (HOSPITAL_COMMUNITY)
Admission: RE | Admit: 2014-11-27 | Discharge: 2014-11-27 | Disposition: A | Discharge status: Home / Self Care | Payer: No Typology Code available for payment source | Source: Ambulatory Visit | Attending: Obstetrics and Gynecology | Admitting: Obstetrics and Gynecology

## 2014-12-19 ENCOUNTER — Telehealth: Payer: Self-pay | Admitting: Nurse Practitioner

## 2014-12-19 ENCOUNTER — Other Ambulatory Visit: Payer: Self-pay | Admitting: *Deleted

## 2014-12-19 DIAGNOSIS — I10 Essential (primary) hypertension: Secondary | ICD-10-CM

## 2014-12-19 MED ORDER — OLMESARTAN MEDOXOMIL-HCTZ 20-12.5 MG PO TABS: 1.0000 | ORAL_TABLET | Freq: Every day | ORAL | Status: DC

## 2014-12-19 NOTE — Telephone Encounter (Signed)
Nurse found samples for her. She will call back next week when mmm is here.

## 2015-01-02 ENCOUNTER — Telehealth: Payer: Self-pay | Admitting: Nurse Practitioner

## 2015-01-02 NOTE — Telephone Encounter (Signed)
Stp she wanted appt with MMM, offered earlier appt with Dietrich Pates but pt declined stating she only wants to see MMM. Pt given appt with MMM 1/21 at 2:45.

## 2015-01-09 ENCOUNTER — Encounter: Payer: Self-pay | Admitting: Nurse Practitioner

## 2015-01-09 ENCOUNTER — Ambulatory Visit (INDEPENDENT_AMBULATORY_CARE_PROVIDER_SITE_OTHER): Payer: No Typology Code available for payment source | Admitting: Nurse Practitioner

## 2015-01-09 VITALS — BP 128/76 | HR 69 | Temp 97.3°F | Ht 66.0 in | Wt 165.0 lb

## 2015-01-09 DIAGNOSIS — B370 Candidal stomatitis: Secondary | ICD-10-CM

## 2015-01-09 MED ORDER — OLMESARTAN MEDOXOMIL 40 MG PO TABS: 40.0000 mg | ORAL_TABLET | Freq: Every day | ORAL | Status: DC

## 2015-01-09 MED ORDER — MAGIC MOUTHWASH: 5.0000 mL | Freq: Four times a day (QID) | ORAL | Status: DC

## 2015-01-09 NOTE — Patient Instructions (Signed)
Thrush, Adult  Thrush, also called oral candidiasis, is a fungal infection that develops in the mouth and throat and on the tongue. It causes white patches to form on the mouth and tongue. Thrush is most common in older adults, but it can occur at any age.  Many cases of thrush are mild, but this infection can also be more serious. Thrush can be a recurring problem for people who have chronic illnesses or who take medicines that limit the body's ability to fight infection. Because these people have difficulty fighting infections, the fungus that causes thrush can spread throughout the body. This can cause life-threatening blood or organ infections. CAUSES  Thrush is usually caused by a yeast called Candida albicans. This fungus is normally present in small amounts in the mouth and on other mucous membranes. It usually causes no harm. However, when conditions are present that allow the fungus to grow uncontrolled, it invades surrounding tissues and becomes an infection. Less often, other Candida species can also lead to thrush.  RISK FACTORS Thrush is more likely to develop in the following people:  People with an impaired ability to fight infection (weakened immune system).   Older adults.   People with HIV.   People with diabetes.   People with dry mouth (xerostomia).   Pregnant women.   People with poor dental care, especially those who have false teeth.   People who use antibiotic medicines.  SIGNS AND SYMPTOMS  Thrush can be a mild infection that causes no symptoms. If symptoms develop, they may include:   A burning feeling in the mouth and throat. This can occur at the start of a thrush infection.   White patches that adhere to the mouth and tongue. The tissue around the patches may be red, raw, and painful. If rubbed (during tooth brushing, for example), the patches and the tissue of the mouth may bleed easily.   A bad taste in the mouth or difficulty tasting foods.    Cottony feeling in the mouth.   Pain during eating and swallowing. DIAGNOSIS  Your health care provider can usually diagnose thrush by looking in your mouth and asking you questions about your health.  TREATMENT  Medicines that help prevent the growth of fungi (antifungals) are the standard treatment for thrush. These medicines are either applied directly to the affected area (topical) or swallowed (oral). The treatment will depend on the severity of the condition.  Mild Thrush Mild cases of thrush may clear up with the use of an antifungal mouth rinse or lozenges. Treatment usually lasts about 14 days.  Moderate to Severe Thrush  More severe thrush infections that have spread to the esophagus are treated with an oral antifungal medicine. A topical antifungal medicine may also be used.   For some severe infections, a treatment period longer than 14 days may be needed.   Oral antifungal medicines are almost never used during pregnancy because the fetus may be harmed. However, if a pregnant woman has a rare, severe thrush infection that has spread to her blood, oral antifungal medicines may be used. In this case, the risk of harm to the mother and fetus from the severe thrush infection may be greater than the risk posed by the use of antifungal medicines.  Persistent or Recurrent Thrush For cases of thrush that do not go away or keep coming back, treatment may involve the following:   Treatment may be needed twice as long as the symptoms last.   Treatment will   include both oral and topical antifungal medicines.   People with weakened immune systems can take an antifungal medicine on a continuous basis to prevent thrush infections.  It is important to treat conditions that make you more likely to get thrush, such as diabetes or HIV.  HOME CARE INSTRUCTIONS   Only take over-the-counter or prescription medicine as directed by your health care provider. Talk to your health care  provider about an over-the-counter medicine called gentian violet, which kills bacteria and fungi.   Eat plain, unflavored yogurt as directed by your health care provider. Check the label to make sure the yogurt contains live cultures. This yogurt can help healthy bacteria grow in the mouth that can stop the growth of the fungus that causes thrush.   Try these measures to help reduce the discomfort of thrush:   Drink cold liquids such as water or iced tea.   Try flavored ice treats or frozen juices.   Eat foods that are easy to swallow, such as gelatin, ice cream, or custard.   If the patches in your mouth are painful, try drinking from a straw.   Rinse your mouth several times a day with a warm saltwater rinse. You can make the saltwater mixture with 1 tsp (6 g) of salt in 8 fl oz (0.2 L) of warm water.   If you wear dentures, remove the dentures before going to bed, brush them vigorously, and soak them in a cleaning solution as directed by your health care provider.   Women who are breastfeeding should clean their nipples with an antifungal medicine as directed by their health care provider. Dry the nipples after breastfeeding. Applying lanolin-containing body lotion may help relieve nipple soreness.  SEEK MEDICAL CARE IF:  Your symptoms are getting worse or are not improving within 7 days of starting treatment.   You have symptoms of spreading infection, such as white patches on the skin outside of the mouth.   You are nursing and you have redness, burning, or pain in the nipples that is not relieved with treatment.  MAKE SURE YOU:  Understand these instructions.  Will watch your condition.  Will get help right away if you are not doing well or get worse. Document Released: 08/31/2004 Document Revised: 09/26/2013 Document Reviewed: 07/09/2013 ExitCare Patient Information 2015 [employer]. This information is not intended to replace advice given to you by your  health care provider. Make sure you discuss any questions you have with your health care provider.  

## 2015-01-09 NOTE — Progress Notes (Signed)
   Subjective:    Patient ID: Jody Wilson, female    DOB: 16-Sep-1951, 64 y.o.   MRN: [id number]  HPI Patient in c/o dry nouth with a white coating on tongue- Metallic taste in mouth al the time- started about 1 month ago but has gotten worse the last week. Mouth feels like it is burning sometimes .    Review of Systems  Constitutional: Negative.   HENT: Negative.   Respiratory: Negative.   Cardiovascular: Negative.   Genitourinary: Negative.   Neurological: Negative.   Psychiatric/Behavioral: Negative.   All other systems reviewed and are negative.      Objective:   Physical Exam  Constitutional: She is oriented to person, place, and time. She appears well-developed and well-nourished.  HENT:  White flim on tongue and post oral pharynx  Cardiovascular: Normal rate, regular rhythm and normal heart sounds.   Pulmonary/Chest: Effort normal and breath sounds normal.  Neurological: She is alert and oriented to person, place, and time.  Skin: Skin is warm and dry.  Psychiatric: She has a normal mood and affect. Her behavior is normal. Judgment and thought content normal.   BP 128/76 mmHg  Pulse 69  Temp(Src) 97.3 F (36.3 C) (Oral)  Ht 5\' 6"  (1.676 m)  Wt 165 lb (74.844 kg)  BMI 26.64 kg/m2        Assessment & Plan:   1. Oral pharyngeal candidiasis    Meds ordered this encounter  Medications  . olmesartan (BENICAR) 40 MG tablet    Sig: Take 1 tablet (40 mg total) by mouth daily.    Dispense:  30 tablet    Refill:  5    Order Specific Question:  Supervising Provider    Answer:  Chipper Herb [[id number]]  . Alum & Mag Hydroxide-Simeth (MAGIC MOUTHWASH) SOLN    Sig: Take 5 mLs by mouth 4 (four) times daily.    Dispense:  250 mL    Refill:  0    Order Specific Question:  Supervising Provider    Answer:  Chipper Herb [[id number]]   Keep dentures clean Follow up in 1 week if no improvement  Mary-Margaret Hassell Done, FNP

## 2015-01-21 ENCOUNTER — Telehealth: Payer: Self-pay | Admitting: *Deleted

## 2015-01-21 MED ORDER — TELMISARTAN 80 MG PO TABS: 80.0000 mg | ORAL_TABLET | Freq: Every day | ORAL | Status: DC

## 2015-01-21 NOTE — Telephone Encounter (Signed)
MM ins co says Jody Wilson has to take in addition  To losartan(which she has taken) she must also take one of these candesartan, telmisartan ,irbesartan before they will cover benicar.

## 2015-01-21 NOTE — Telephone Encounter (Signed)
Let patient know must try telmarsartin before insurance will pay for benicar

## 2015-01-22 ENCOUNTER — Telehealth: Payer: Self-pay | Admitting: *Deleted

## 2015-01-22 NOTE — Telephone Encounter (Signed)
Pt notified of RX change Verbalizes understanding 

## 2015-01-22 NOTE — Telephone Encounter (Signed)
Mm, I talked with Jody Wilson and told her of the medicine change and she said she had an allergic reaction to the losartan that her Gyn put  Her on, she said she saw you back in oct for this. She has 7 days of samples left, i told her we would be back in touch before her pills run out,  Thanks for your help

## 2015-01-22 NOTE — Telephone Encounter (Signed)
Med changed to is in the same class- benicar is in that class also

## 2015-01-22 NOTE — Telephone Encounter (Signed)
Please contact patient

## 2015-01-29 LAB — HM MAMMOGRAPHY: HM Mammogram: NEGATIVE

## 2015-02-07 NOTE — Telephone Encounter (Signed)
Talked with Pam is having swelling in hands and feet on telmesartan will try to stay on until comes for her appt with MM on 02-19-15 if it gets worse before then she will let us know.

## 2015-02-07 NOTE — Telephone Encounter (Signed)
ok 

## 2015-02-19 ENCOUNTER — Ambulatory Visit (INDEPENDENT_AMBULATORY_CARE_PROVIDER_SITE_OTHER): Payer: No Typology Code available for payment source | Admitting: Nurse Practitioner

## 2015-02-19 ENCOUNTER — Encounter: Payer: Self-pay | Admitting: Nurse Practitioner

## 2015-02-19 VITALS — BP 160/86 | HR 58 | Temp 97.1°F | Ht 66.0 in | Wt 165.0 lb

## 2015-02-19 DIAGNOSIS — R5382 Chronic fatigue, unspecified: Secondary | ICD-10-CM | POA: Diagnosis not present

## 2015-02-19 DIAGNOSIS — I10 Essential (primary) hypertension: Secondary | ICD-10-CM

## 2015-02-19 DIAGNOSIS — E785 Hyperlipidemia, unspecified: Secondary | ICD-10-CM | POA: Insufficient documentation

## 2015-02-19 DIAGNOSIS — F4323 Adjustment disorder with mixed anxiety and depressed mood: Secondary | ICD-10-CM | POA: Diagnosis not present

## 2015-02-19 MED ORDER — OLMESARTAN MEDOXOMIL 40 MG PO TABS: 40.0000 mg | ORAL_TABLET | Freq: Every day | ORAL | Status: DC

## 2015-02-19 MED ORDER — HYDROCHLOROTHIAZIDE 12.5 MG PO CAPS
12.5000 mg | ORAL_CAPSULE | Freq: Every day | ORAL | Status: DC
Start: 2015-02-19 — End: 2015-10-03

## 2015-02-19 NOTE — Progress Notes (Signed)
   Subjective:    Patient ID: Jody Wilson, female    DOB: [birth year], 64 y.o.   MRN: [id number]  Patient is here for chronic visit follow up. She reports she has not taken her benicar for over 2 weeks.   Hypertension This is a chronic problem. The current episode started more than 1 year ago. The problem has been waxing and waning since onset. The problem is resistant. Pertinent negatives include no headaches, orthopnea, palpitations or peripheral edema. Risk factors for coronary artery disease include dyslipidemia and post-menopausal state. Past treatments include calcium channel blockers. The current treatment provides moderate improvement. Compliance problems include diet and exercise.   ANXIETY:  Taking xanax and zoloft and  is doing well.  GERD: Taking omeprazole OTC.   Review of Systems  Cardiovascular: Negative for palpitations and orthopnea.  Neurological: Negative for headaches.  All other systems reviewed and are negative.      Objective:   Physical Exam  Constitutional: She is oriented to person, place, and time. She appears well-developed and well-nourished.  HENT:  Head: Normocephalic.  Eyes: Pupils are equal, round, and reactive to light.  Neck: Normal range of motion.  Cardiovascular: Normal rate.   Pulmonary/Chest: Effort normal and breath sounds normal.  Musculoskeletal: Normal range of motion.  Neurological: She is alert and oriented to person, place, and time.  Skin: Skin is warm.  Psychiatric: She has a normal mood and affect. Her behavior is normal.    BP 160/86 mmHg  Pulse 58  Temp(Src) 97.1 F (36.2 C) (Oral)  Ht $R'5\' 6"'ZC$  (1.676 m)  Wt 165 lb (74.844 kg)  BMI 26.64 kg/m2        Assessment & Plan:   1. Essential hypertension Do not add salt to diet Keep diary of blood pressures at home - olmesartan (BENICAR) 40 MG tablet; Take 1 tablet (40 mg total) by mouth daily.  Dispense: 28 tablet; Refill: 0 - hydrochlorothiazide (MICROZIDE) 12.5 MG  capsule; Take 1 capsule (12.5 mg total) by mouth daily.  Dispense: 30 capsule; Refill: 5 - CMP14+EGFR  2. Adjustment disorder with mixed anxiety and depressed mood Continue zoloft an dxanax  3. Hyperlipidemia with target LDL less than 100 Low fat diet - NMR, lipoprofile  4. Chronic fatigue - Anemia Profile B    Labs pending Health maintenance reviewed Diet and exercise encouraged Continue all meds Follow up  In 3 months   [place], FNP

## 2015-02-19 NOTE — Patient Instructions (Signed)
Stress and Stress Management Stress is a normal reaction to life events. It is what you feel when life demands more than you are used to or more than you can handle. Some stress can be useful. For example, the stress reaction can help you catch the last bus of the day, study for a test, or meet a deadline at work. But stress that occurs too often or for too long can cause problems. It can affect your emotional health and interfere with relationships and normal daily activities. Too much stress can weaken your immune system and increase your risk for physical illness. If you already have a medical problem, stress can make it worse. CAUSES  All sorts of life events may cause stress. An event that causes stress for one person may not be stressful for another person. Major life events commonly cause stress. These may be positive or negative. Examples include losing your job, moving into a new home, getting married, having a baby, or losing a loved one. Less obvious life events may also cause stress, especially if they occur day after day or in combination. Examples include working long hours, driving in traffic, caring for children, being in debt, or being in a difficult relationship. SIGNS AND SYMPTOMS Stress may cause emotional symptoms including, the following:  Anxiety. This is feeling worried, afraid, on edge, overwhelmed, or out of control.  Anger. This is feeling irritated or impatient.  Depression. This is feeling sad, down, helpless, or guilty.  Difficulty focusing, remembering, or making decisions. Stress may cause physical symptoms, including the following:   Aches and pains. These may affect your head, neck, back, stomach, or other areas of your body.  Tight muscles or clenched jaw.  Low energy or trouble sleeping. Stress may cause unhealthy behaviors, including the following:   Eating to feel better (overeating) or skipping meals.  Sleeping too little, too much, or both.  Working  too much or putting off tasks (procrastination).  Smoking, drinking alcohol, or using drugs to feel better. DIAGNOSIS  Stress is diagnosed through an assessment by your health care provider. Your health care provider will ask questions about your symptoms and any stressful life events.Your health care provider will also ask about your medical history and may order blood tests or other tests. Certain medical conditions and medicine can cause physical symptoms similar to stress. Mental illness can cause emotional symptoms and unhealthy behaviors similar to stress. Your health care provider may refer you to a mental health professional for further evaluation.  TREATMENT  Stress management is the recommended treatment for stress.The goals of stress management are reducing stressful life events and coping with stress in healthy ways.  Techniques for reducing stressful life events include the following:  Stress identification. Self-monitor for stress and identify what causes stress for you. These skills may help you to avoid some stressful events.  Time management. Set your priorities, keep a calendar of events, and learn to say "no." These tools can help you avoid making too many commitments. Techniques for coping with stress include the following:  Rethinking the problem. Try to think realistically about stressful events rather than ignoring them or overreacting. Try to find the positives in a stressful situation rather than focusing on the negatives.  Exercise. Physical exercise can release both physical and emotional tension. The key is to find a form of exercise you enjoy and do it regularly.  Relaxation techniques. These relax the body and mind. Examples include yoga, meditation, tai chi, biofeedback, deep  breathing, progressive muscle relaxation, listening to music, being out in nature, journaling, and other hobbies. Again, the key is to find one or more that you enjoy and can do  regularly.  Healthy lifestyle. Eat a balanced diet, get plenty of sleep, and do not smoke. Avoid using alcohol or drugs to relax.  Strong support network. Spend time with family, friends, or other people you enjoy being around.Express your feelings and talk things over with someone you trust. Counseling or talktherapy with a mental health professional may be helpful if you are having difficulty managing stress on your own. Medicine is typically not recommended for the treatment of stress.Talk to your health care provider if you think you need medicine for symptoms of stress. HOME CARE INSTRUCTIONS  Keep all follow-up visits as directed by your health care provider.  Take all medicines as directed by your health care provider. SEEK MEDICAL CARE IF:  Your symptoms get worse or you start having new symptoms.  You feel overwhelmed by your problems and can no longer manage them on your own. SEEK IMMEDIATE MEDICAL CARE IF:  You feel like hurting yourself or someone else. Document Released: 06/01/2001 Document Revised: 04/22/2014 Document Reviewed: 07/31/2013 Exit[employer] Patient Information 2015 [employer]. This information is not intended to replace advice given to you by your health care provider. Make sure you discuss any questions you have with your health care provider.  

## 2015-04-29 ENCOUNTER — Ambulatory Visit (INDEPENDENT_AMBULATORY_CARE_PROVIDER_SITE_OTHER): Payer: No Typology Code available for payment source | Admitting: Nurse Practitioner

## 2015-04-29 ENCOUNTER — Encounter: Payer: Self-pay | Admitting: Nurse Practitioner

## 2015-04-29 VITALS — BP 128/75 | HR 71 | Temp 97.6°F | Ht 66.0 in | Wt 163.0 lb

## 2015-04-29 DIAGNOSIS — B370 Candidal stomatitis: Secondary | ICD-10-CM

## 2015-04-29 MED ORDER — MICONAZOLE 50 MG BU TABS
1.0000 | ORAL_TABLET | Freq: Two times a day (BID) | BUCCAL | Status: DC
Start: 2015-04-29 — End: 2015-09-05

## 2015-04-29 NOTE — Progress Notes (Signed)
   Subjective:    Patient ID: Jody Wilson, female    DOB: July 07, 1951, 64 y.o.   MRN: [id number]  HPI Patient in today c/o film in mouth. Started about yesterday- mouth feels raw.    Review of Systems  Constitutional: Negative.   HENT: Negative.   Respiratory: Negative.   Cardiovascular: Negative.   Genitourinary: Negative.   Neurological: Negative.   All other systems reviewed and are negative.      Objective:   Physical Exam  Constitutional: She is oriented to person, place, and time. She appears well-developed and well-nourished.  HENT:  White coating on tongue and buccal mucosa.  Cardiovascular: Normal rate, regular rhythm and normal heart sounds.   Pulmonary/Chest: Effort normal and breath sounds normal.  Abdominal: Soft. Bowel sounds are normal.  Neurological: She is alert and oriented to person, place, and time.  Skin: Skin is warm and dry.  Psychiatric: She has a normal mood and affect. Her behavior is normal. Judgment and thought content normal.   BP 128/75 mmHg  Pulse 71  Temp(Src) 97.6 F (36.4 C) (Oral)  Ht 5\' 6"  (1.676 m)  Wt 163 lb (73.936 kg)  BMI 26.32 kg/m2        Assessment & Plan:   1. Thrush    Meds ordered this encounter  Medications  . Miconazole (ORAVIG) 50 MG TABS    Sig: Place 1 tablet (50 mg total) inside cheek 2 (two) times daily.    Dispense:  14 tablet    Refill:  1    Order Specific Question:  Supervising Provider    Answer:  Chipper Herb [[id number]]   Rinse mouth out daily RTO prn  Mary-Margaret Hassell Done, FNP

## 2015-04-29 NOTE — Patient Instructions (Signed)
Thrush, Adult  Thrush is an infection that can happen on the mouth, throat, tongue, or other areas. It causes white patches to form on the mouth and tongue. HOME CARE  Only take medicine as told by your doctor. You may be given medicine to swallow or to apply right on the area.  Eat plain yogurt that contains live cultures (check the label).  Rinse your mouth many times a day with a warm saltwater rinse. To make the rinse, mix 1 teaspoon (6 g) of salt in 8 ounces (0.2 L) of warm water. To reduce pain:  Drink cold liquids such as water or iced tea.  Eat frozen ice pops or frozen juices.  Eat foods that are easy to swallow, such as gelatin or ice cream.  Drink from a straw if the patches are painful. If you are breastfeeding:  Clean your nipples with an antifungal medicine.  Dry your nipples after breastfeeding.  Use an ointment called lanolin to help relieve nipple soreness. If you wear dentures:  Take out your dentures before going to bed.  Brush them thoroughly.  Soak them in a denture cleaner. GET HELP IF:   Your problems are getting worse.  Your problems are not improving within 7 days of starting treatment.  Your infection is spreading. This may show as white patches on the skin outside of your mouth.  You are nursing and have redness and pain in the nipples. MAKE SURE YOU:  Understand these instructions.  Will watch your condition.  Will get help right away if you are not doing well or get worse. Document Released: 03/02/2010 Document Revised: 09/26/2013 Document Reviewed: 07/09/2013 Exit[employer] Patient Information 2015 [employer]. This information is not intended to replace advice given to you by your health care provider. Make sure you discuss any questions you have with your health care provider.  

## 2015-05-15 ENCOUNTER — Telehealth: Payer: Self-pay | Admitting: Nurse Practitioner

## 2015-05-15 NOTE — Telephone Encounter (Signed)
Give benicar samples until i can figure out what to do.

## 2015-05-15 NOTE — Telephone Encounter (Signed)
Please review and advise.

## 2015-05-16 NOTE — Telephone Encounter (Signed)
We do not have any samples.  Please advise. 

## 2015-05-17 NOTE — Telephone Encounter (Signed)
Patient called again on Saturday asking what she can do about meds. She has enough to last one week. We don't have any samples. Please advise

## 2015-05-20 ENCOUNTER — Telehealth: Payer: Self-pay

## 2015-05-20 NOTE — Telephone Encounter (Signed)
Jody Wilson she cannot tolerate others in class- this is the only one that works for her- ace causes a cough. What can we do.

## 2015-05-20 NOTE — Telephone Encounter (Signed)
She has failed losartan and irbesartan

## 2015-05-20 NOTE — Telephone Encounter (Signed)
Didn't know about the ARB's only that ACE's gave her a cough  So now I will have to do an appeal letter

## 2015-05-20 NOTE — Telephone Encounter (Signed)
Insurance denied Jody Wilson stating that following criteria has to be met: Non Formulary ARB's are covered (Jody Wilson) after documented failure of TWO ARB's including losartan, telmisartan, candesartan or irbesartan

## 2015-05-27 ENCOUNTER — Telehealth: Payer: Self-pay

## 2015-05-27 NOTE — Telephone Encounter (Signed)
Insurance approved appeal for Benicar from 05/23/15 to 05/22/18 Authorization # [id number]

## 2015-07-29 ENCOUNTER — Ambulatory Visit (INDEPENDENT_AMBULATORY_CARE_PROVIDER_SITE_OTHER): Payer: No Typology Code available for payment source

## 2015-07-29 ENCOUNTER — Ambulatory Visit (INDEPENDENT_AMBULATORY_CARE_PROVIDER_SITE_OTHER): Payer: No Typology Code available for payment source | Admitting: Nurse Practitioner

## 2015-07-29 ENCOUNTER — Encounter: Payer: Self-pay | Admitting: Nurse Practitioner

## 2015-07-29 VITALS — BP 127/80 | HR 67 | Temp 97.4°F | Ht 66.0 in | Wt 165.0 lb

## 2015-07-29 DIAGNOSIS — R0781 Pleurodynia: Secondary | ICD-10-CM

## 2015-07-29 DIAGNOSIS — K219 Gastro-esophageal reflux disease without esophagitis: Secondary | ICD-10-CM

## 2015-07-29 DIAGNOSIS — S20212A Contusion of left front wall of thorax, initial encounter: Secondary | ICD-10-CM

## 2015-07-29 MED ORDER — HYDROCODONE-ACETAMINOPHEN 5-325 MG PO TABS: 1.0000 | ORAL_TABLET | Freq: Four times a day (QID) | ORAL | Status: DC | PRN

## 2015-07-29 MED ORDER — PANTOPRAZOLE SODIUM 40 MG PO TBEC: 40.0000 mg | DELAYED_RELEASE_TABLET | Freq: Every day | ORAL | Status: DC

## 2015-07-29 NOTE — Patient Instructions (Signed)
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
## 2015-09-05 ENCOUNTER — Ambulatory Visit (INDEPENDENT_AMBULATORY_CARE_PROVIDER_SITE_OTHER): Payer: No Typology Code available for payment source | Admitting: Family Medicine

## 2015-09-05 ENCOUNTER — Encounter (INDEPENDENT_AMBULATORY_CARE_PROVIDER_SITE_OTHER): Payer: Self-pay

## 2015-09-05 ENCOUNTER — Encounter: Payer: Self-pay | Admitting: Family Medicine

## 2015-09-05 DIAGNOSIS — S161XXA Strain of muscle, fascia and tendon at neck level, initial encounter: Secondary | ICD-10-CM | POA: Diagnosis not present

## 2015-09-05 MED ORDER — TIZANIDINE HCL 4 MG PO TABS: 4.0000 mg | ORAL_TABLET | Freq: Four times a day (QID) | ORAL | Status: DC | PRN
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
## 2015-09-05 NOTE — Patient Instructions (Signed)

## 2015-09-29 ENCOUNTER — Ambulatory Visit (INDEPENDENT_AMBULATORY_CARE_PROVIDER_SITE_OTHER): Payer: No Typology Code available for payment source | Admitting: Family Medicine

## 2015-09-29 ENCOUNTER — Encounter: Payer: Self-pay | Admitting: Family Medicine

## 2015-09-29 VITALS — BP 164/90 | HR 67 | Temp 97.4°F | Ht 66.0 in | Wt 169.6 lb

## 2015-09-29 DIAGNOSIS — M542 Cervicalgia: Secondary | ICD-10-CM | POA: Diagnosis not present

## 2015-09-29 DIAGNOSIS — M25519 Pain in unspecified shoulder: Secondary | ICD-10-CM

## 2015-09-29 DIAGNOSIS — S134XXA Sprain of ligaments of cervical spine, initial encounter: Secondary | ICD-10-CM | POA: Insufficient documentation

## 2015-09-29 DIAGNOSIS — S134XXD Sprain of ligaments of cervical spine, subsequent encounter: Secondary | ICD-10-CM

## 2015-09-29 MED ORDER — MELOXICAM 7.5 MG PO TABS: 7.5000 mg | ORAL_TABLET | Freq: Every day | ORAL | Status: DC

## 2015-09-29 MED ORDER — TIZANIDINE HCL 4 MG PO TABS: 4.0000 mg | ORAL_TABLET | Freq: Four times a day (QID) | ORAL | Status: DC | PRN
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
## 2015-10-03 ENCOUNTER — Other Ambulatory Visit: Payer: Self-pay | Admitting: Nurse Practitioner

## 2015-11-26 ENCOUNTER — Encounter: Payer: Self-pay | Admitting: Family Medicine

## 2015-11-26 ENCOUNTER — Ambulatory Visit (INDEPENDENT_AMBULATORY_CARE_PROVIDER_SITE_OTHER): Payer: 59 | Admitting: Family Medicine

## 2015-11-26 VITALS — BP 148/85 | HR 66 | Temp 98.5°F | Ht 66.0 in | Wt 166.0 lb

## 2015-11-26 DIAGNOSIS — F4323 Adjustment disorder with mixed anxiety and depressed mood: Secondary | ICD-10-CM

## 2015-11-26 DIAGNOSIS — K219 Gastro-esophageal reflux disease without esophagitis: Secondary | ICD-10-CM | POA: Diagnosis not present

## 2015-11-26 DIAGNOSIS — I10 Essential (primary) hypertension: Secondary | ICD-10-CM

## 2015-11-26 DIAGNOSIS — S134XXD Sprain of ligaments of cervical spine, subsequent encounter: Secondary | ICD-10-CM | POA: Diagnosis not present

## 2015-11-26 DIAGNOSIS — E785 Hyperlipidemia, unspecified: Secondary | ICD-10-CM

## 2015-11-26 MED ORDER — ALPRAZOLAM 0.5 MG PO TABS: 0.5000 mg | ORAL_TABLET | ORAL | Status: DC | PRN

## 2015-11-26 MED ORDER — HYDROCHLOROTHIAZIDE 12.5 MG PO CAPS: 12.5000 mg | ORAL_CAPSULE | Freq: Every day | ORAL | Status: DC

## 2015-11-26 MED ORDER — PANTOPRAZOLE SODIUM 40 MG PO TBEC: 40.0000 mg | DELAYED_RELEASE_TABLET | Freq: Every day | ORAL | Status: DC

## 2015-11-26 MED ORDER — SERTRALINE HCL 50 MG PO TABS: 50.0000 mg | ORAL_TABLET | Freq: Every day | ORAL | Status: DC

## 2015-11-26 MED ORDER — MELOXICAM 7.5 MG PO TABS: 7.5000 mg | ORAL_TABLET | Freq: Every day | ORAL | Status: DC

## 2015-11-26 MED ORDER — OLMESARTAN MEDOXOMIL 40 MG PO TABS: 40.0000 mg | ORAL_TABLET | Freq: Every day | ORAL | Status: DC

## 2015-11-26 MED ORDER — TIZANIDINE HCL 4 MG PO TABS: 4.0000 mg | ORAL_TABLET | Freq: Four times a day (QID) | ORAL | Status: DC | PRN
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
## 2015-11-28 ENCOUNTER — Ambulatory Visit: Payer: No Typology Code available for payment source | Admitting: Family Medicine

## 2015-11-29 ENCOUNTER — Other Ambulatory Visit: Payer: 59

## 2015-11-29 DIAGNOSIS — F4323 Adjustment disorder with mixed anxiety and depressed mood: Secondary | ICD-10-CM

## 2015-11-29 DIAGNOSIS — E785 Hyperlipidemia, unspecified: Secondary | ICD-10-CM

## 2015-11-29 DIAGNOSIS — I10 Essential (primary) hypertension: Secondary | ICD-10-CM

## 2015-11-30 LAB — CMP14+EGFR
ALT: 8 IU/L (ref 0–32)
AST: 14 IU/L (ref 0–40)
Albumin/Globulin Ratio: 1.5 (ref 1.1–2.5)
Albumin: 3.9 g/dL (ref 3.6–4.8)
Alkaline Phosphatase: 113 IU/L (ref 39–117)
BUN/Creatinine Ratio: 27 — ABNORMAL HIGH (ref 11–26)
BUN: 16 mg/dL (ref 8–27)
Bilirubin Total: 0.3 mg/dL (ref 0.0–1.2)
CO2: 25 mmol/L (ref 18–29)
Calcium: 9.3 mg/dL (ref 8.7–10.3)
Chloride: 106 mmol/L (ref 97–106)
Creatinine, Ser: 0.59 mg/dL (ref 0.57–1.00)
GFR calc Af Amer: 113 mL/min/{1.73_m2} (ref >59)
GFR calc non Af Amer: 98 mL/min/{1.73_m2} (ref >59)
Globulin, Total: 2.6 g/dL (ref 1.5–4.5)
Glucose: 89 mg/dL (ref 65–99)
Potassium: 4.8 mmol/L (ref 3.5–5.2)
Sodium: 142 mmol/L (ref 136–144)
Total Protein: 6.5 g/dL (ref 6.0–8.5)

## 2015-11-30 LAB — LIPID PANEL
Chol/HDL Ratio: 4.9 ratio units — ABNORMAL HIGH (ref 0.0–4.4)
Cholesterol, Total: 190 mg/dL (ref 100–199)
HDL: 39 mg/dL — ABNORMAL LOW (ref >39)
LDL Calculated: 112 mg/dL — ABNORMAL HIGH (ref 0–99)
Triglycerides: 195 mg/dL — ABNORMAL HIGH (ref 0–149)
VLDL Cholesterol Cal: 39 mg/dL (ref 5–40)

## 2015-11-30 LAB — TSH: TSH: 0.495 u[IU]/mL (ref 0.450–4.500)

## 2015-12-01 ENCOUNTER — Telehealth: Payer: Self-pay | Admitting: Family Medicine

## 2015-12-01 NOTE — Telephone Encounter (Signed)
Patient notified of results.

## 2015-12-31 ENCOUNTER — Ambulatory Visit (INDEPENDENT_AMBULATORY_CARE_PROVIDER_SITE_OTHER): Payer: 59 | Admitting: Family Medicine

## 2015-12-31 ENCOUNTER — Encounter: Payer: Self-pay | Admitting: Family Medicine

## 2015-12-31 VITALS — BP 127/86 | HR 82 | Temp 97.7°F | Ht 66.0 in | Wt 166.2 lb

## 2015-12-31 DIAGNOSIS — S134XXD Sprain of ligaments of cervical spine, subsequent encounter: Secondary | ICD-10-CM
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
## 2016-01-02 ENCOUNTER — Other Ambulatory Visit: Payer: Self-pay | Admitting: Nurse Practitioner

## 2016-01-28 ENCOUNTER — Telehealth: Payer: Self-pay | Admitting: Family Medicine

## 2016-01-28 NOTE — Telephone Encounter (Signed)
GOES [facility]IC[facility]

## 2016-02-06 ENCOUNTER — Encounter: Payer: Self-pay | Admitting: *Deleted

## 2016-03-11 ENCOUNTER — Ambulatory Visit (INDEPENDENT_AMBULATORY_CARE_PROVIDER_SITE_OTHER): Payer: 59 | Admitting: Family Medicine

## 2016-03-11 ENCOUNTER — Encounter: Payer: Self-pay | Admitting: Family Medicine

## 2016-03-11 VITALS — BP 172/87 | HR 60 | Temp 98.2°F | Ht 66.0 in | Wt 163.0 lb

## 2016-03-11 DIAGNOSIS — K219 Gastro-esophageal reflux disease without esophagitis: Secondary | ICD-10-CM | POA: Diagnosis not present

## 2016-03-11 DIAGNOSIS — B370 Candidal stomatitis: Secondary | ICD-10-CM | POA: Diagnosis not present

## 2016-03-11 DIAGNOSIS — J441 Chronic obstructive pulmonary disease with (acute) exacerbation: Secondary | ICD-10-CM | POA: Insufficient documentation

## 2016-03-11 MED ORDER — ESOMEPRAZOLE MAGNESIUM 40 MG PO CPDR: 40.0000 mg | DELAYED_RELEASE_CAPSULE | Freq: Every day | ORAL | Status: DC

## 2016-03-11 MED ORDER — AZITHROMYCIN 250 MG PO TABS: ORAL_TABLET | ORAL | Status: DC

## 2016-03-11 MED ORDER — FLUCONAZOLE 150 MG PO TABS: 150.0000 mg | ORAL_TABLET | Freq: Every day | ORAL | Status: DC

## 2016-03-11 MED ORDER — PREDNISONE 20 MG PO TABS: ORAL_TABLET | ORAL | Status: DC

## 2016-03-14 NOTE — Progress Notes (Signed)
BP 172/87 mmHg  Pulse 60  Temp(Src) 98.2 F (36.8 C) (Oral)  Ht 5\' 6"  (1.676 m)  Wt 163 lb (73.936 kg)  BMI 26.32 kg/m2   Subjective:    Patient ID: Jody Wilson, female    DOB: [birth year], 65 y.o.   MRN: [id number]  HPI: Jody Wilson is a 65 y.o. female presenting on 03/11/2016 for Thrush; Muscle Pain; and Shortness of Breath   HPI Mouth sores Patient has been having mouth sores for the past few days and then she has had thrush. She does use oral steroids inhaled and tries to make sure she rinses after every time but has gotten thrush before. She has some sores on the back of her tongue and white thick discharge especially in her Norvasc that she wears at work.  Shortness of breath and muscle aches Patient has been having shortness of breath and muscle aches that have been increasing over the past couple days. She denies any fevers or chills. She has been having increasing shortness of breath. She continues to smoke and says she is going to try to quit but is not ready just yet.'s also been having increasing cough and increasing heartburn. She feels like the omeprazole is not working as well like to switch back to something else. Acid is causing indigestion and then she has a metallic taste in her mouth and sometimes food gets stuck isn't going down her throat.  Relevant past medical, surgical, family and social history reviewed and updated as indicated. Interim medical history since our last visit reviewed. Allergies and medications reviewed and updated.  Review of Systems  Constitutional: Negative for fever and chills.  HENT: Positive for congestion, mouth sores, postnasal drip, rhinorrhea, sinus pressure, sneezing and sore throat. Negative for ear discharge and ear pain.   Eyes: Negative for pain, redness and visual disturbance.  Respiratory: Positive for cough, shortness of breath and wheezing. Negative for chest tightness.   Cardiovascular: Negative for chest pain and leg  swelling.  Genitourinary: Negative for dysuria and difficulty urinating.  Musculoskeletal: Positive for myalgias. Negative for back pain and gait problem.  Skin: Negative for rash.  Neurological: Negative for light-headedness and headaches.  Psychiatric/Behavioral: Negative for behavioral problems and agitation.  All other systems reviewed and are negative.   Per HPI unless specifically indicated above     Medication List       This list is accurate as of: 03/11/16 11:59 PM.  Always use your most recent med list.               albuterol 108 (90 Base) MCG/ACT inhaler  Commonly known as:  PROVENTIL HFA;VENTOLIN HFA  Inhale 2 puffs into the lungs every 6 (six) hours as needed for wheezing or shortness of breath.     ALPRAZolam 0.5 MG tablet  Commonly known as:  XANAX  Take 1 tablet (0.5 mg total) by mouth as needed for anxiety.     atorvastatin 40 MG tablet  Commonly known as:  LIPITOR  Take 1 tablet (40 mg total) by mouth daily.     azithromycin 250 MG tablet  Commonly known as:  ZITHROMAX  Take 2 the first day and then one each day after.     esomeprazole 40 MG capsule  Commonly known as:  NEXIUM  Take 1 capsule (40 mg total) by mouth daily.     fluconazole 150 MG tablet  Commonly known as:  DIFLUCAN  Take 1 tablet (150 mg total) by  mouth daily.     hydrochlorothiazide 12.5 MG capsule  Commonly known as:  MICROZIDE  Take 1 capsule (12.5 mg total) by mouth daily.     meloxicam 7.5 MG tablet  Commonly known as:  MOBIC  Take 1 tablet (7.5 mg total) by mouth daily.     olmesartan 40 MG tablet  Commonly known as:  BENICAR  Take 1 tablet (40 mg total) by mouth daily.     BENICAR 40 MG tablet  Generic drug:  olmesartan  TAKE 1 TABLET DAILY     oxybutynin 5 MG 24 hr tablet  Commonly known as:  DITROPAN-XL  Take 5 mg by mouth at bedtime.     pantoprazole 40 MG tablet  Commonly known as:  PROTONIX  Take 1 tablet (40 mg total) by mouth daily.     predniSONE 20  MG tablet  Commonly known as:  DELTASONE  2 po at same time daily for 5 days     sertraline 50 MG tablet  Commonly known as:  ZOLOFT  Take 1 tablet (50 mg total) by mouth daily.     tiZANidine 4 MG tablet  Commonly known as:  ZANAFLEX  Take 1 tablet (4 mg total) by mouth every 6 (six) hours as needed for muscle spasms.           Objective:    BP 172/87 mmHg  Pulse 60  Temp(Src) 98.2 F (36.8 C) (Oral)  Ht 5\' 6"  (1.676 m)  Wt 163 lb (73.936 kg)  BMI 26.32 kg/m2  Wt Readings from Last 3 Encounters:  03/11/16 163 lb (73.936 kg)  12/31/15 166 lb 3.2 oz (75.388 kg)  11/26/15 166 lb (75.297 kg)    Physical Exam  Constitutional: She is oriented to person, place, and time. She appears well-developed and well-nourished. No distress.  HENT:  Right Ear: Tympanic membrane, external ear and ear canal normal.  Left Ear: Tympanic membrane, external ear and ear canal normal.  Nose: Mucosal edema and rhinorrhea present. No epistaxis. Right sinus exhibits no maxillary sinus tenderness and no frontal sinus tenderness. Left sinus exhibits no maxillary sinus tenderness and no frontal sinus tenderness.  Mouth/Throat: Uvula is midline and mucous membranes are normal. Posterior oropharyngeal edema and posterior oropharyngeal erythema present. No oropharyngeal exudate or tonsillar abscesses.  Patient has inflamed papilla on the posterior tongue. She also has a small amount of white discharge on the lateral aspects of her mouth.  Eyes: Conjunctivae and EOM are normal.  Neck: Neck supple. No thyromegaly present.  Cardiovascular: Normal rate, regular rhythm, normal heart sounds and intact distal pulses.   No murmur heard. Pulmonary/Chest: Effort normal and breath sounds normal. No respiratory distress. She has no wheezes.  Musculoskeletal: Normal range of motion. She exhibits no edema or tenderness.  Lymphadenopathy:    She has no cervical adenopathy.  Neurological: She is alert and oriented to  person, place, and time. Coordination normal.  Skin: Skin is warm and dry. No rash noted. She is not diaphoretic.  Psychiatric: She has a normal mood and affect. Her behavior is normal.  Vitals reviewed.   Results for orders placed or performed in visit on 02/06/16  [facility] MAMMOGRAPHY  Result Value Ref Range   HM Mammogram negative       Assessment & Plan:   Problem List Items Addressed This Visit      Respiratory   COPD exacerbation ([place])   Relevant Medications   azithromycin (ZITHROMAX) 250 MG tablet   predniSONE (DELTASONE)  20 MG tablet     Digestive   GERD (gastroesophageal reflux disease)   Relevant Medications   esomeprazole (NEXIUM) 40 MG capsule    Other Visit Diagnoses    Thrush, oral    -  Primary    Relevant Medications    azithromycin (ZITHROMAX) 250 MG tablet    fluconazole (DIFLUCAN) 150 MG tablet        Follow up plan: Return if symptoms worsen or fail to improve.  Counseling provided for all of the vaccine components No orders of the defined types were placed in this encounter.    [name], MD [facility] 03/11/2016, 4:12 PM

## 2016-04-05 ENCOUNTER — Encounter: Payer: Self-pay | Admitting: Family Medicine

## 2016-04-05 ENCOUNTER — Ambulatory Visit (INDEPENDENT_AMBULATORY_CARE_PROVIDER_SITE_OTHER): Payer: 59 | Admitting: Family Medicine

## 2016-04-05 VITALS — BP 172/91 | HR 67 | Temp 98.3°F | Ht 66.0 in | Wt 167.8 lb

## 2016-04-05 DIAGNOSIS — M791 Myalgia, unspecified site: Secondary | ICD-10-CM

## 2016-04-05 MED ORDER — PREDNISONE 20 MG PO TABS: ORAL_TABLET | ORAL | Status: DC

## 2016-04-05 NOTE — Progress Notes (Signed)
BP 172/91 mmHg  Pulse 67  Temp(Src) 98.3 F (36.8 C) (Oral)  Ht 5\' 6"  (1.676 m)  Wt 167 lb 12.8 oz (76.114 kg)  BMI 27.10 kg/m2   Subjective:    Patient ID: Jody Wilson, female    DOB: [birth year], 65 y.o.   MRN: [id number]  HPI: DONIS AMADO is a 65 y.o. female presenting on 04/05/2016 for Elbow Pain   HPI Muscle pain Patient has bilateral upper extremity muscular pain. The pain is similar to her lower leg cramping that she was having before but the cramping in her lower legs resolved after the prednisone and now it's up in her arms. She even feels like she has lost some grip strength in her upper arms as well. She denies any fevers or chills or rashes. She denies any shortness of breath or wheezing. She denies any neck pain or pain with neck movement.  Relevant past medical, surgical, family and social history reviewed and updated as indicated. Interim medical history since our last visit reviewed. Allergies and medications reviewed and updated.  Review of Systems  Constitutional: Negative for fever and chills.  HENT: Negative for congestion, ear discharge and ear pain.   Eyes: Negative for redness and visual disturbance.  Respiratory: Negative for chest tightness and shortness of breath.   Cardiovascular: Negative for chest pain and leg swelling.  Genitourinary: Negative for dysuria and difficulty urinating.  Musculoskeletal: Positive for myalgias. Negative for back pain, joint swelling, arthralgias, gait problem, neck pain and neck stiffness.  Skin: Negative for color change and rash.  Neurological: Negative for light-headedness and headaches.  Psychiatric/Behavioral: Negative for behavioral problems and agitation.  All other systems reviewed and are negative.   Per HPI unless specifically indicated above     Medication List       This list is accurate as of: 04/05/16  1:13 PM.  Always use your most recent med list.               albuterol 108 (90 Base)  MCG/ACT inhaler  Commonly known as:  PROVENTIL HFA;VENTOLIN HFA  Inhale 2 puffs into the lungs every 6 (six) hours as needed for wheezing or shortness of breath.     esomeprazole 40 MG capsule  Commonly known as:  NEXIUM  Take 1 capsule (40 mg total) by mouth daily.     hydrochlorothiazide 12.5 MG capsule  Commonly known as:  MICROZIDE  Take 1 capsule (12.5 mg total) by mouth daily.     olmesartan 40 MG tablet  Commonly known as:  BENICAR  Take 1 tablet (40 mg total) by mouth daily.     BENICAR 40 MG tablet  Generic drug:  olmesartan  TAKE 1 TABLET DAILY     pantoprazole 40 MG tablet  Commonly known as:  PROTONIX  Take 1 tablet (40 mg total) by mouth daily.     predniSONE 20 MG tablet  Commonly known as:  DELTASONE  2 po at same time daily for 5 days     sertraline 50 MG tablet  Commonly known as:  ZOLOFT  Take 1 tablet (50 mg total) by mouth daily.     tiZANidine 4 MG tablet  Commonly known as:  ZANAFLEX  Take 1 tablet (4 mg total) by mouth every 6 (six) hours as needed for muscle spasms.           Objective:    BP 172/91 mmHg  Pulse 67  Temp(Src) 98.3 F (36.8 C) (Oral)  Ht 5\' 6"  (1.676 m)  Wt 167 lb 12.8 oz (76.114 kg)  BMI 27.10 kg/m2  Wt Readings from Last 3 Encounters:  04/05/16 167 lb 12.8 oz (76.114 kg)  03/11/16 163 lb (73.936 kg)  12/31/15 166 lb 3.2 oz (75.388 kg)    Physical Exam  Constitutional: She is oriented to person, place, and time. She appears well-developed and well-nourished. No distress.  Eyes: Conjunctivae and EOM are normal. Pupils are equal, round, and reactive to light.  Neck: Neck supple. No thyromegaly present.  Cardiovascular: Normal rate, regular rhythm, normal heart sounds and intact distal pulses.   No murmur heard. Pulmonary/Chest: Effort normal and breath sounds normal. No respiratory distress. She has no wheezes.  Musculoskeletal: Normal range of motion. She exhibits no edema or tenderness.  Pain in the muscles from the  subscapularis all of the way way down her arms bilaterally. No pain with movement on passive range of motion of joints. No weakness noted on exam but patient has subjective grip strength loss.  Lymphadenopathy:    She has no cervical adenopathy.  Neurological: She is alert and oriented to person, place, and time. Coordination normal.  Skin: Skin is warm and dry. No rash noted. She is not diaphoretic. No erythema.  Psychiatric: She has a normal mood and affect. Her behavior is normal.  Nursing note and vitals reviewed.     Assessment & Plan:   Problem List Items Addressed This Visit    None    Visit Diagnoses    Myalgia    -  Primary    Bilateral upper extremity myalgias, will check labs and give muscle relaxer    Relevant Medications    predniSONE (DELTASONE) 20 MG tablet    Other Relevant Orders    CK    ANA    C-reactive protein    Sedimentation rate    TSH    CBC with Differential/Platelet       Follow up plan: Return if symptoms worsen or fail to improve.  Counseling provided for all of the vaccine components Orders Placed This Encounter  Procedures  . CK  . ANA  . C-reactive protein  . Sedimentation rate  . TSH  . CBC with Differential/Platelet    [name], MD [facility] 04/05/2016, 1:13 PM

## 2016-04-06 LAB — CBC WITH DIFFERENTIAL/PLATELET
Basophils Absolute: 0 10*3/uL (ref 0.0–0.2)
Basos: 0 %
EOS (ABSOLUTE): 0.2 10*3/uL (ref 0.0–0.4)
Eos: 3 %
Hematocrit: 36.4 % (ref 34.0–46.6)
Hemoglobin: 11.8 g/dL (ref 11.1–15.9)
Immature Grans (Abs): 0 10*3/uL (ref 0.0–0.1)
Immature Granulocytes: 0 %
Lymphocytes Absolute: 2.6 10*3/uL (ref 0.7–3.1)
Lymphs: 33 %
MCH: 26.6 pg (ref 26.6–33.0)
MCHC: 32.4 g/dL (ref 31.5–35.7)
MCV: 82 fL (ref 79–97)
Monocytes Absolute: 0.6 10*3/uL (ref 0.1–0.9)
Monocytes: 8 %
Neutrophils Absolute: 4.5 10*3/uL (ref 1.4–7.0)
Neutrophils: 56 %
Platelets: 510 10*3/uL — ABNORMAL HIGH (ref 150–379)
RBC: 4.44 x10E6/uL (ref 3.77–5.28)
RDW: 16.4 % — ABNORMAL HIGH (ref 12.3–15.4)
WBC: 8 10*3/uL (ref 3.4–10.8)

## 2016-04-06 LAB — C-REACTIVE PROTEIN: CRP: 0.9 mg/L (ref 0.0–4.9)

## 2016-04-06 LAB — CK: Total CK: 86 U/L (ref 24–173)

## 2016-04-06 LAB — TSH: TSH: 1.4 u[IU]/mL (ref 0.450–4.500)

## 2016-04-06 LAB — ANA: Anti Nuclear Antibody(ANA): NEGATIVE

## 2016-04-06 LAB — SEDIMENTATION RATE: Sed Rate: 16 mm/hr (ref 0–40)

## 2016-04-15 ENCOUNTER — Encounter: Payer: Self-pay | Admitting: Family Medicine

## 2016-04-20 ENCOUNTER — Ambulatory Visit (INDEPENDENT_AMBULATORY_CARE_PROVIDER_SITE_OTHER): Payer: 59

## 2016-04-20 ENCOUNTER — Encounter: Payer: Self-pay | Admitting: Family Medicine

## 2016-04-20 ENCOUNTER — Ambulatory Visit (INDEPENDENT_AMBULATORY_CARE_PROVIDER_SITE_OTHER): Payer: 59 | Admitting: Family Medicine

## 2016-04-20 VITALS — BP 161/85 | HR 63 | Temp 98.7°F | Ht 66.0 in | Wt 168.8 lb

## 2016-04-20 DIAGNOSIS — M542 Cervicalgia: Secondary | ICD-10-CM | POA: Diagnosis not present

## 2016-04-20 DIAGNOSIS — M792 Neuralgia and neuritis, unspecified: Secondary | ICD-10-CM

## 2016-04-20 DIAGNOSIS — M791 Myalgia, unspecified site: Secondary | ICD-10-CM

## 2016-04-20 MED ORDER — PREDNISONE 20 MG PO TABS: ORAL_TABLET | ORAL | Status: DC

## 2016-04-20 NOTE — Progress Notes (Signed)
BP 161/85 mmHg  Pulse 63  Temp(Src) 98.7 F (37.1 C) (Oral)  Ht 5\' 6"  (1.676 m)  Wt 168 lb 12.8 oz (76.567 kg)  BMI 27.26 kg/m2   Subjective:    Patient ID: Jody Wilson, female    DOB: May 19, 1951, 65 y.o.   MRN: [id number]  HPI: Jody Wilson is a 65 y.o. female presenting on 04/20/2016 for Bilateral upper extremity pain   HPI Arm cramps and pain Patient has been having bilateral arm cramps and pain in both arms that improved with the prednisone and Flexeril and then came back quickly right after. She says she started to have some more cramping in both arms and some weakness in grip strength and in her arms. She denies any tingling or numbness in either arm. She has had previously cervical hardware placed and has not seen an orthopedic for checkup on that in quite some years as this was a while ago. She is also having muscle cramps and spasming in both arms especially near her biceps and forearm. She denies any fevers or chills. She denies any rashes anywhere.  Relevant past medical, surgical, family and social history reviewed and updated as indicated. Interim medical history since our last visit reviewed. Allergies and medications reviewed and updated.  Review of Systems  Constitutional: Negative for fever and chills.  HENT: Negative for congestion, ear discharge and ear pain.   Eyes: Negative for redness and visual disturbance.  Respiratory: Negative for chest tightness and shortness of breath.   Cardiovascular: Negative for chest pain and leg swelling.  Genitourinary: Negative for dysuria and difficulty urinating.  Musculoskeletal: Positive for myalgias and neck pain. Negative for back pain, joint swelling, arthralgias, gait problem and neck stiffness.  Skin: Negative for color change and rash.  Neurological: Positive for weakness. Negative for dizziness, light-headedness, numbness and headaches.  Psychiatric/Behavioral: Negative for behavioral problems and agitation.    All other systems reviewed and are negative.   Per HPI unless specifically indicated above     Medication List       This list is accurate as of: 04/20/16 11:22 AM.  Always use your most recent med list.               albuterol 108 (90 Base) MCG/ACT inhaler  Commonly known as:  PROVENTIL HFA;VENTOLIN HFA  Inhale 2 puffs into the lungs every 6 (six) hours as needed for wheezing or shortness of breath.     esomeprazole 40 MG capsule  Commonly known as:  NEXIUM  Take 1 capsule (40 mg total) by mouth daily.     hydrochlorothiazide 12.5 MG capsule  Commonly known as:  MICROZIDE  Take 1 capsule (12.5 mg total) by mouth daily.     olmesartan 40 MG tablet  Commonly known as:  BENICAR  Take 1 tablet (40 mg total) by mouth daily.     BENICAR 40 MG tablet  Generic drug:  olmesartan  TAKE 1 TABLET DAILY     pantoprazole 40 MG tablet  Commonly known as:  PROTONIX  Take 1 tablet (40 mg total) by mouth daily.     predniSONE 20 MG tablet  Commonly known as:  DELTASONE  2 po at same time daily for 5 days     sertraline 50 MG tablet  Commonly known as:  ZOLOFT  Take 1 tablet (50 mg total) by mouth daily.           Objective:    BP 161/85 mmHg  Pulse 63  Temp(Src) 98.7 F (37.1 C) (Oral)  Ht 5\' 6"  (1.676 m)  Wt 168 lb 12.8 oz (76.567 kg)  BMI 27.26 kg/m2  Wt Readings from Last 3 Encounters:  04/20/16 168 lb 12.8 oz (76.567 kg)  04/05/16 167 lb 12.8 oz (76.114 kg)  03/11/16 163 lb (73.936 kg)    Physical Exam  Constitutional: She is oriented to person, place, and time. She appears well-developed and well-nourished. No distress.  Eyes: Conjunctivae and EOM are normal. Pupils are equal, round, and reactive to light.  Neck: Neck supple. No thyromegaly present.  Cardiovascular: Normal rate, regular rhythm, normal heart sounds and intact distal pulses.   No murmur heard. Pulmonary/Chest: Effort normal and breath sounds normal. No respiratory distress. She has no  wheezes.  Musculoskeletal: Normal range of motion. She exhibits no edema.       Cervical back: She exhibits tenderness (Tenderness and cervical spine causes shooting pains going down both arms but worse on left on palpation.) and spasm. She exhibits normal range of motion, no bony tenderness, no deformity and no laceration.  Lymphadenopathy:    She has no cervical adenopathy.  Neurological: She is alert and oriented to person, place, and time. She displays no atrophy and no tremor. No cranial nerve deficit or sensory deficit. She exhibits normal muscle tone. Coordination normal.  4/5 grip strength bilaterally, 5 out of 5 strength in all other muscle groups.  Skin: Skin is warm and dry. No rash noted. She is not diaphoretic.  Psychiatric: She has a normal mood and affect. Her behavior is normal.  Nursing note and vitals reviewed.   Cervical spine x-ray: Await final read by radiologist    Assessment & Plan:   Problem List Items Addressed This Visit    None    Visit Diagnoses    Cervical pain (neck)    -  Primary    Relevant Orders    Ambulatory referral to Orthopedic Surgery    DG Cervical Spine Complete    Radicular pain in right arm        Relevant Orders    Ambulatory referral to Orthopedic Surgery    DG Cervical Spine Complete    Radicular pain in left arm        Relevant Orders    Ambulatory referral to Orthopedic Surgery    DG Cervical Spine Complete    Myalgia        Relevant Orders    Ambulatory referral to Rheumatology        Follow up plan: Return if symptoms worsen or fail to improve.  Counseling provided for all of the vaccine components Orders Placed This Encounter  Procedures  . DG Cervical Spine Complete  . Ambulatory referral to Orthopedic Surgery  . Ambulatory referral to [facility] Shelisa Fern, MD [facility] 04/20/2016, 11:22 AM

## 2016-04-20 NOTE — Progress Notes (Signed)
Patient aware.

## 2016-05-07 ENCOUNTER — Other Ambulatory Visit: Payer: Self-pay | Admitting: Rehabilitation

## 2016-05-07 DIAGNOSIS — M4802 Spinal stenosis, cervical region: Secondary | ICD-10-CM

## 2016-05-08 ENCOUNTER — Ambulatory Visit
Admission: RE | Admit: 2016-05-08 | Discharge: 2016-05-08 | Disposition: A | Discharge status: Home / Self Care | Payer: 59 | Source: Ambulatory Visit | Attending: Rehabilitation | Admitting: Rehabilitation

## 2016-05-08 DIAGNOSIS — M4802 Spinal stenosis, cervical region: Secondary | ICD-10-CM

## 2016-05-08 MED ORDER — GADOBENATE DIMEGLUMINE 529 MG/ML IV SOLN
15.0000 mL | Freq: Once | INTRAVENOUS | Status: AC | PRN
  Administered 2016-05-08: 15 mL via INTRAVENOUS

## 2016-05-12 ENCOUNTER — Other Ambulatory Visit: Payer: Self-pay | Admitting: Rehabilitation

## 2016-05-12 DIAGNOSIS — M4802 Spinal stenosis, cervical region: Secondary | ICD-10-CM

## 2016-06-16 ENCOUNTER — Other Ambulatory Visit: Payer: Self-pay | Admitting: Family Medicine

## 2016-09-16 ENCOUNTER — Ambulatory Visit (INDEPENDENT_AMBULATORY_CARE_PROVIDER_SITE_OTHER): Payer: 59 | Admitting: Family Medicine

## 2016-09-16 ENCOUNTER — Encounter: Payer: Self-pay | Admitting: Family Medicine

## 2016-09-16 VITALS — BP 127/69 | HR 66 | Temp 98.9°F | Ht 66.0 in | Wt 162.1 lb

## 2016-09-16 DIAGNOSIS — E785 Hyperlipidemia, unspecified: Secondary | ICD-10-CM | POA: Diagnosis not present

## 2016-09-16 DIAGNOSIS — K219 Gastro-esophageal reflux disease without esophagitis: Secondary | ICD-10-CM | POA: Diagnosis not present

## 2016-09-16 DIAGNOSIS — B001 Herpesviral vesicular dermatitis: Secondary | ICD-10-CM | POA: Diagnosis not present

## 2016-09-16 DIAGNOSIS — N6459 Other signs and symptoms in breast: Secondary | ICD-10-CM | POA: Diagnosis not present

## 2016-09-16 DIAGNOSIS — I10 Essential (primary) hypertension: Secondary | ICD-10-CM

## 2016-09-16 DIAGNOSIS — F4323 Adjustment disorder with mixed anxiety and depressed mood: Secondary | ICD-10-CM

## 2016-09-16 MED ORDER — VALACYCLOVIR HCL 1 G PO TABS: 1000.0000 mg | ORAL_TABLET | Freq: Two times a day (BID) | ORAL | 2 refills | Status: DC

## 2016-09-16 MED ORDER — OLMESARTAN MEDOXOMIL 40 MG PO TABS: 40.0000 mg | ORAL_TABLET | Freq: Every day | ORAL | 5 refills | Status: DC

## 2016-09-16 MED ORDER — PANTOPRAZOLE SODIUM 40 MG PO TBEC: 40.0000 mg | DELAYED_RELEASE_TABLET | Freq: Every day | ORAL | 6 refills | Status: DC

## 2016-09-16 MED ORDER — HYDROCHLOROTHIAZIDE 12.5 MG PO CAPS: ORAL_CAPSULE | ORAL | 5 refills | Status: DC

## 2016-09-16 MED ORDER — ALPRAZOLAM 0.5 MG PO TABS: 0.5000 mg | ORAL_TABLET | Freq: Two times a day (BID) | ORAL | 0 refills | Status: DC | PRN

## 2016-09-16 MED ORDER — SERTRALINE HCL 50 MG PO TABS: 50.0000 mg | ORAL_TABLET | Freq: Every day | ORAL | 6 refills | Status: DC

## 2016-09-16 NOTE — Progress Notes (Signed)
BP 127/69   Pulse 66   Temp 98.9 F (37.2 C) (Oral)   Ht 5' 6"  (1.676 m)   Wt 162 lb 2 oz (73.5 kg)   BMI 26.17 kg/m    Subjective:    Patient ID: Jody Wilson, female    DOB: [birth year], 65 y.o.   MRN: [id number]  HPI: TEJASVI BRISSETT is a 65 y.o. female presenting on 09/16/2016 for Hyperlipidemia (6 month followup); Hypertension; and Mouth Lesions (patient would like to be prescribed Valtrex for outbreaks)   HPI  Hypertension recheck Patient is coming in for hypertension recheck today. Her blood pressure is 127/69. She is currently on hydrochlorothiazide and olmesartan. Patient denies headaches, blurred vision, chest pains, shortness of breath, or weakness. Denies any side effects from medication and is content with current medication.   Hyperlipidemia Patient is coming in for recheck of her cholesterol today. She is currently diet controlled for this. She will come back and do the labs fasting as she is not fasting today.  Inverted nipples Patient comes in today with complaints of new inverted nipples. She said initially started on her right nipple about 3 or 4 months ago and then it started on her left nipple. She did have a mammogram earlier this year about 6 or 7 months ago before she started developing a inverted nipples. She has not noticed any mass or discharge out of her nipples. She has not noticed any redness or warmth. She has not had any fevers or chills.  Medication refill for GERD and anxiety, patient is doing well on these medications and just needs a refill of them.  Relevant past medical, surgical, family and social history reviewed and updated as indicated. Interim medical history since our last visit reviewed. Allergies and medications reviewed and updated.  Review of Systems  Constitutional: Negative for chills and fever.  HENT: Negative for congestion, ear discharge and ear pain.   Eyes: Negative for redness and visual disturbance.  Respiratory:  Negative for chest tightness and shortness of breath.   Cardiovascular: Negative for chest pain and leg swelling.  Genitourinary: Negative for difficulty urinating and dysuria.  Musculoskeletal: Negative for back pain and gait problem.  Skin: Negative for rash.  Neurological: Negative for light-headedness and headaches.  Psychiatric/Behavioral: Negative for agitation and behavioral problems.  All other systems reviewed and are negative.   Per HPI unless specifically indicated above     Medication List       Accurate as of 09/16/16  4:11 PM. Always use your most recent med list.          albuterol 108 (90 Base) MCG/ACT inhaler Commonly known as:  PROVENTIL HFA;VENTOLIN HFA Inhale 2 puffs into the lungs every 6 (six) hours as needed for wheezing or shortness of breath.   ALPRAZolam 0.5 MG tablet Commonly known as:  XANAX Take 1 tablet (0.5 mg total) by mouth 2 (two) times daily as needed for anxiety.   hydrochlorothiazide 12.5 MG capsule Commonly known as:  MICROZIDE TAKE (1) CAPSULE DAILY   olmesartan 40 MG tablet Commonly known as:  BENICAR Take 1 tablet (40 mg total) by mouth daily.   pantoprazole 40 MG tablet Commonly known as:  PROTONIX Take 1 tablet (40 mg total) by mouth daily.   sertraline 50 MG tablet Commonly known as:  ZOLOFT Take 1 tablet (50 mg total) by mouth daily.   valACYclovir 1000 MG tablet Commonly known as:  VALTREX Take 1 tablet (1,000 mg total) by mouth  2 (two) times daily.          Objective:    BP 127/69   Pulse 66   Temp 98.9 F (37.2 C) (Oral)   Ht 5' 6"  (1.676 m)   Wt 162 lb 2 oz (73.5 kg)   BMI 26.17 kg/m   Wt Readings from Last 3 Encounters:  09/16/16 162 lb 2 oz (73.5 kg)  04/20/16 168 lb 12.8 oz (76.6 kg)  04/05/16 167 lb 12.8 oz (76.1 kg)    Physical Exam  Constitutional: She is oriented to person, place, and time. She appears well-developed and well-nourished. No distress.  Eyes: Conjunctivae are normal.    Cardiovascular: Normal rate, regular rhythm, normal heart sounds and intact distal pulses.   No murmur heard. Pulmonary/Chest: Effort normal and breath sounds normal. No respiratory distress. She has no wheezes. She exhibits no mass, no tenderness and no deformity. Right breast exhibits inverted nipple. Right breast exhibits no mass, no nipple discharge, no skin change and no tenderness. Left breast exhibits inverted nipple. Left breast exhibits no mass, no nipple discharge, no skin change and no tenderness. Breasts are symmetrical.  Musculoskeletal: Normal range of motion. She exhibits no edema or tenderness.  Neurological: She is alert and oriented to person, place, and time. Coordination normal.  Skin: Skin is warm and dry. No rash noted. She is not diaphoretic.  Psychiatric: She has a normal mood and affect. Her behavior is normal.  Nursing note and vitals reviewed.     Assessment & Plan:   Problem List Items Addressed This Visit      Cardiovascular and Mediastinum   Hypertension - Primary   Relevant Medications   olmesartan (BENICAR) 40 MG tablet   hydrochlorothiazide (MICROZIDE) 12.5 MG capsule   Other Relevant Orders   CMP14+EGFR     Digestive   GERD (gastroesophageal reflux disease)   Relevant Medications   pantoprazole (PROTONIX) 40 MG tablet     Other   Adjustment disorder with mixed anxiety and depressed mood   Relevant Medications   sertraline (ZOLOFT) 50 MG tablet   Hyperlipidemia with target LDL less than 100   Relevant Medications   olmesartan (BENICAR) 40 MG tablet   hydrochlorothiazide (MICROZIDE) 12.5 MG capsule   Other Relevant Orders   Lipid panel    Other Visit Diagnoses    Inverted nipple       Already had a mammogram this year, inverted nipples are new, we'll schedule for another mammogram   Relevant Orders   MM Digital Diagnostic Bilat   Cold sore       Relevant Medications   valACYclovir (VALTREX) 1000 MG tablet       Follow up plan: Return  in about 3 months (around 12/16/2016), or if symptoms worsen or fail to improve, for Recheck hypertension.  Counseling provided for all of the vaccine components Orders Placed This Encounter  Procedures  . MM Digital Diagnostic Bilat  . CMP14+EGFR  . Lipid panel    [name], MD [facility] 09/16/2016, 4:11 PM

## 2016-10-02 ENCOUNTER — Other Ambulatory Visit (INDEPENDENT_AMBULATORY_CARE_PROVIDER_SITE_OTHER): Payer: 59

## 2016-10-02 DIAGNOSIS — I10 Essential (primary) hypertension: Secondary | ICD-10-CM

## 2016-10-02 DIAGNOSIS — E785 Hyperlipidemia, unspecified: Secondary | ICD-10-CM

## 2016-10-03 LAB — CMP14+EGFR
ALT: 10 IU/L (ref 0–32)
AST: 12 IU/L (ref 0–40)
Albumin/Globulin Ratio: 1.6 (ref 1.2–2.2)
Albumin: 4.1 g/dL (ref 3.6–4.8)
Alkaline Phosphatase: 114 IU/L (ref 39–117)
BUN/Creatinine Ratio: 20 (ref 12–28)
BUN: 15 mg/dL (ref 8–27)
Bilirubin Total: 0.4 mg/dL (ref 0.0–1.2)
CO2: 26 mmol/L (ref 18–29)
Calcium: 9.4 mg/dL (ref 8.7–10.3)
Chloride: 105 mmol/L (ref 96–106)
Creatinine, Ser: 0.75 mg/dL (ref 0.57–1.00)
GFR calc Af Amer: 97 mL/min/{1.73_m2} (ref >59)
GFR calc non Af Amer: 85 mL/min/{1.73_m2} (ref >59)
Globulin, Total: 2.6 g/dL (ref 1.5–4.5)
Glucose: 106 mg/dL — ABNORMAL HIGH (ref 65–99)
Potassium: 4.6 mmol/L (ref 3.5–5.2)
Sodium: 145 mmol/L — ABNORMAL HIGH (ref 134–144)
Total Protein: 6.7 g/dL (ref 6.0–8.5)

## 2016-10-03 LAB — LIPID PANEL
Chol/HDL Ratio: 4.2 ratio units (ref 0.0–4.4)
Cholesterol, Total: 191 mg/dL (ref 100–199)
HDL: 45 mg/dL (ref >39)
LDL Calculated: 106 mg/dL — ABNORMAL HIGH (ref 0–99)
Triglycerides: 202 mg/dL — ABNORMAL HIGH (ref 0–149)
VLDL Cholesterol Cal: 40 mg/dL (ref 5–40)

## 2016-10-06 ENCOUNTER — Telehealth: Payer: Self-pay

## 2016-10-06 DIAGNOSIS — Z1231 Encounter for screening mammogram for malignant neoplasm of breast: Secondary | ICD-10-CM

## 2016-10-06 NOTE — Telephone Encounter (Signed)
-----   Message from Worthy Rancher, MD sent at 10/06/2016 10:09 AM EDT ----- Regarding: FW: Breast Imaging Orders Contact: [phone] Jan can you order these for her    ----- Message ----- From: Sandre Kitty Sent: 10/05/2016   2:50 PM To: Fransisca Kaufmann Dettinger, MD Subject: Breast Imaging Orders                          [facility] performs all diagnostic mammograms with TOMO (3D), and all breast u/s as LTD.   Please update the orders in Epic to reflect the following:  [id number] Bil Diag Tomo Mammogram  [id number]  Lt Breast LTD U/S  [id number]  Rt Breast LTD U/S   Once the orders have been updated, the patient will be contacted.   Please call with any questions.  Thank you.

## 2016-11-12 ENCOUNTER — Ambulatory Visit (INDEPENDENT_AMBULATORY_CARE_PROVIDER_SITE_OTHER): Payer: 59 | Admitting: Family Medicine

## 2016-11-12 ENCOUNTER — Other Ambulatory Visit: Payer: Self-pay | Admitting: Family Medicine

## 2016-11-12 ENCOUNTER — Encounter: Payer: Self-pay | Admitting: Family Medicine

## 2016-11-12 ENCOUNTER — Ambulatory Visit (INDEPENDENT_AMBULATORY_CARE_PROVIDER_SITE_OTHER): Payer: 59

## 2016-11-12 VITALS — BP 189/87 | HR 70 | Temp 98.0°F | Ht 67.0 in | Wt 161.0 lb

## 2016-11-12 DIAGNOSIS — M4850XA Collapsed vertebra, not elsewhere classified, site unspecified, initial encounter for fracture: Secondary | ICD-10-CM | POA: Diagnosis not present

## 2016-11-12 DIAGNOSIS — M545 Low back pain, unspecified: Secondary | ICD-10-CM

## 2016-11-12 DIAGNOSIS — IMO0001 Reserved for inherently not codable concepts without codable children: Secondary | ICD-10-CM

## 2016-11-12 NOTE — Progress Notes (Signed)
BP (!) 189/87   Pulse 70   Temp 98 F (36.7 C) (Oral)   Ht 5\' 7"  (1.702 m)   Wt 161 lb (73 kg)   BMI 25.22 kg/m    Subjective:    Patient ID: Jody Wilson, female    DOB: 12-18-1951, 65 y.o.   MRN: [id number]  HPI: [name] is a 65 y.o. female presenting on 11/12/2016 for Back Pain   HPI Back pain Patient has back pain starting in the lower thoracic going down to the right side of her lower back. This is been going on for the past week. She does not report call a fall or any specific incident that brought this on. She had a colleague prescribe her some Vicodin and steroids and she has been using muscle relaxers without much success. She normally works on her feet all day and she just cannot stand right now. She denies any numbness or weakness in either leg or any pain radiating down either leg. No loss of bowel or bladder  Relevant past medical, surgical, family and social history reviewed and updated as indicated. Interim medical history since our last visit reviewed. Allergies and medications reviewed and updated.  Review of Systems  Constitutional: Negative for chills and fever.  HENT: Negative for congestion, ear discharge and ear pain.   Eyes: Negative for redness and visual disturbance.  Respiratory: Negative for chest tightness and shortness of breath.   Cardiovascular: Negative for chest pain and leg swelling.  Genitourinary: Negative for difficulty urinating and dysuria.  Musculoskeletal: Positive for back pain and myalgias. Negative for gait problem.  Skin: Negative for color change and rash.  Neurological: Negative for light-headedness and headaches.  Psychiatric/Behavioral: Negative for agitation and behavioral problems.  All other systems reviewed and are negative.   Per HPI unless specifically indicated above      Objective:    BP (!) 189/87   Pulse 70   Temp 98 F (36.7 C) (Oral)   Ht 5\' 7"  (1.702 m)   Wt 161 lb (73 kg)   BMI 25.22 kg/m     Wt Readings from Last 3 Encounters:  11/12/16 161 lb (73 kg)  09/16/16 162 lb 2 oz (73.5 kg)  04/20/16 168 lb 12.8 oz (76.6 kg)    Physical Exam  Constitutional: She is oriented to person, place, and time. She appears well-developed and well-nourished.  Eyes: Conjunctivae are normal.  Musculoskeletal: Normal range of motion. She exhibits no edema.       Lumbar back: She exhibits tenderness and bony tenderness. She exhibits normal range of motion, no swelling and normal pulse.       Back:  Neurological: She is alert and oriented to person, place, and time. Coordination normal.  Skin: Skin is warm and dry. No rash noted. She is not diaphoretic.  Psychiatric: She has a normal mood and affect. Her behavior is normal.  Nursing note and vitals reviewed.   Lumbar x-ray: 1. Mild compression fracture deformity of the T11 vertebral body, of uncertain age, but new compared to a lateral chest x-ray of 03/18/2014. Any pain at the thoracolumbar junction region? Consider CT or MRI for further characterization. 2. No acute findings within the lumbar spine. 3. Levoscoliosis. 4. Degenerative changes of the lumbar spine, as described above. 5. Aortic atherosclerosis. These results will be called to the ordering clinician or representative by the Radiologist Assistant, and communication documented in the PACS or zVision Dashboard    Assessment & Plan:  Problem List Items Addressed This Visit    None    Visit Diagnoses    Vertebral compression fracture, initial encounter ([place])    -  Primary   T11 compression fracture, pain coincides, patient wants to wait a week for insurance to change after the first of the month, CT scan and referral to radiology    Acute midline low back pain without sciatica           Follow up plan: Return if symptoms worsen or fail to improve.  Counseling provided for all of the vaccine components No orders of the defined types were placed in this  encounter.   [name], MD [facility] 11/12/2016, 6:13 PM

## 2016-11-17 ENCOUNTER — Telehealth: Payer: Self-pay

## 2016-11-17 ENCOUNTER — Telehealth: Payer: Self-pay | Admitting: Family Medicine

## 2016-11-17 ENCOUNTER — Other Ambulatory Visit: Payer: Self-pay

## 2016-11-17 MED ORDER — HYDROCODONE-ACETAMINOPHEN 5-325 MG PO TABS: 1.0000 | ORAL_TABLET | Freq: Four times a day (QID) | ORAL | 0 refills | Status: DC | PRN

## 2016-11-17 MED ORDER — TIZANIDINE HCL 4 MG PO TABS: 4.0000 mg | ORAL_TABLET | Freq: Four times a day (QID) | ORAL | 0 refills | Status: DC | PRN

## 2016-11-17 NOTE — Telephone Encounter (Signed)
Patient aware, script is ready. 

## 2016-11-17 NOTE — Telephone Encounter (Signed)
Ahead and give her 30 of the hydrocodone and a refill of the Zanaflex

## 2016-11-17 NOTE — Telephone Encounter (Signed)
Based on where it is I think the MRI would be a thoracic MRI or a thoracolumbar MRI, [facility] may be able to help with this

## 2016-11-17 NOTE — Telephone Encounter (Signed)
Patient would like for [place] to go ahead and schedule an MRI for her after January 1st

## 2016-11-17 NOTE — Telephone Encounter (Signed)
Patient seen Jody Wilson 11/24 for a vertebral compression fracture. Patient states that she told Dr. Warrick Parisian that she had left over zanaflex 4mg  and hydrocodone 5-325 and was told if she ran out to let him know and he would fill the prescriptions. Patient states that she only has one pill left. Please advise and send back to the pools.

## 2016-11-19 ENCOUNTER — Ambulatory Visit (INDEPENDENT_AMBULATORY_CARE_PROVIDER_SITE_OTHER): Payer: Medicare Other | Admitting: Family Medicine

## 2016-11-19 ENCOUNTER — Encounter: Payer: Self-pay | Admitting: Family Medicine

## 2016-11-19 VITALS — BP 176/90 | HR 65 | Temp 97.5°F | Ht 67.0 in | Wt 163.4 lb

## 2016-11-19 DIAGNOSIS — M4850XS Collapsed vertebra, not elsewhere classified, site unspecified, sequela of fracture: Secondary | ICD-10-CM

## 2016-11-19 DIAGNOSIS — IMO0001 Reserved for inherently not codable concepts without codable children: Secondary | ICD-10-CM

## 2016-11-19 MED ORDER — ALPRAZOLAM 0.5 MG PO TABS: 0.5000 mg | ORAL_TABLET | Freq: Two times a day (BID) | ORAL | 2 refills | Status: DC | PRN

## 2016-11-19 NOTE — Progress Notes (Signed)
BP (!) 181/93   Pulse 65   Temp 97.5 F (36.4 C) (Oral)   Ht 5\' 7"  (1.702 m)   Wt 163 lb 6 oz (74.1 kg)   BMI 25.59 kg/m    Subjective:    Patient ID: Jody Wilson, female    DOB: 25-Aug-1951, 65 y.o.   MRN: [id number]  HPI: Jody Wilson is a 65 y.o. female presenting on 11/19/2016 for Referral for MRI (wants to have at [facility] on [facility]); Back Pain (pain in back is no better); and Edema in feet and ankles   HPI Back pain Patient is coming in for revisit on her back pain because it has not improved. On her last visit they found that she had a compression fracture, likely acute and she has continued to have significant pain despite medications. She has changed her insurance as of today and wants to go ahead and do MRI and whatever else needs to be done. She denies any numbness or weakness in either leg. She has been less mobile than she usually is and has started to get some swelling in her right leg but she does get up every hour to 2 hours because she can't stay in one place. She denies any pain in that leg but just the swelling.  Relevant past medical, surgical, family and social history reviewed and updated as indicated. Interim medical history since our last visit reviewed. Allergies and medications reviewed and updated.  Review of Systems  Constitutional: Negative for chills and fever.  HENT: Negative for congestion, ear discharge and ear pain.   Eyes: Negative for redness and visual disturbance.  Respiratory: Negative for chest tightness and shortness of breath.   Cardiovascular: Positive for leg swelling. Negative for chest pain.  Genitourinary: Negative for difficulty urinating and dysuria.  Musculoskeletal: Positive for back pain. Negative for gait problem.  Skin: Negative for rash.  Neurological: Negative for light-headedness and headaches.  Psychiatric/Behavioral: Negative for agitation and behavioral problems.  All other systems reviewed and  are negative.   Per HPI unless specifically indicated above     Objective:    BP (!) 181/93   Pulse 65   Temp 97.5 F (36.4 C) (Oral)   Ht 5\' 7"  (1.702 m)   Wt 163 lb 6 oz (74.1 kg)   BMI 25.59 kg/m   Wt Readings from Last 3 Encounters:  11/19/16 163 lb 6 oz (74.1 kg)  11/12/16 161 lb (73 kg)  09/16/16 162 lb 2 oz (73.5 kg)    Physical Exam  Constitutional: She is oriented to person, place, and time. She appears well-developed and well-nourished. No distress.  Eyes: Conjunctivae are normal.  Musculoskeletal: Normal range of motion. She exhibits edema (Trace right lower extremity edema) and tenderness (Midline low back tenderness).  Neurological: She is alert and oriented to person, place, and time. Coordination normal.  Skin: Skin is warm and dry. No rash noted. She is not diaphoretic.  Psychiatric: She has a normal mood and affect. Her behavior is normal.  Nursing note and vitals reviewed.     Assessment & Plan:   Problem List Items Addressed This Visit    None    Visit Diagnoses    Vertebral compression fracture, sequela    -  Primary   Got her insurance change and is ready to go do MRI.   Relevant Orders   MR Thoracic Spine Wo Contrast   Ambulatory referral to Interventional Radiology  Follow up plan: Return if symptoms worsen or fail to improve.  Counseling provided for all of the vaccine components Orders Placed This Encounter  Procedures  . MR Thoracic Spine Wo Contrast  . Ambulatory referral to Interventional Radiology    [name], MD [facility] 11/19/2016, 11:57 AM

## 2016-11-24 ENCOUNTER — Ambulatory Visit
Admission: RE | Admit: 2016-11-24 | Discharge: 2016-11-24 | Disposition: A | Discharge status: Home / Self Care | Payer: Medicare Other | Source: Ambulatory Visit | Attending: Family Medicine | Admitting: Family Medicine

## 2016-11-24 DIAGNOSIS — S22088A Other fracture of T11-T12 vertebra, initial encounter for closed fracture: Secondary | ICD-10-CM | POA: Diagnosis not present

## 2016-11-24 DIAGNOSIS — M4850XS Collapsed vertebra, not elsewhere classified, site unspecified, sequela of fracture: Principal | ICD-10-CM

## 2016-11-24 DIAGNOSIS — IMO0001 Reserved for inherently not codable concepts without codable children: Secondary | ICD-10-CM

## 2016-11-26 ENCOUNTER — Other Ambulatory Visit: Payer: Self-pay

## 2016-11-26 MED ORDER — TIZANIDINE HCL 4 MG PO TABS: 4.0000 mg | ORAL_TABLET | Freq: Four times a day (QID) | ORAL | 0 refills | Status: DC | PRN

## 2016-11-26 MED ORDER — HYDROCODONE-ACETAMINOPHEN 5-325 MG PO TABS: 1.0000 | ORAL_TABLET | Freq: Four times a day (QID) | ORAL | 0 refills | Status: DC | PRN

## 2016-11-26 NOTE — Telephone Encounter (Signed)
Dr D - I called the pt and she states she also needs the Zanaflex refilled as well. - it will go to [facility] - thx

## 2016-11-26 NOTE — Telephone Encounter (Signed)
Pt states she is out of pain medicine and would like a refill.  Her MRI is being reviewed by Dr. Estanislado Pandy and she is waiting on an appointment date.time for kyphoplasty

## 2016-11-30 ENCOUNTER — Telehealth: Payer: Self-pay | Admitting: Family Medicine

## 2016-11-30 ENCOUNTER — Other Ambulatory Visit: Payer: Self-pay | Admitting: Family Medicine

## 2016-11-30 DIAGNOSIS — N6453 Retraction of nipple: Secondary | ICD-10-CM

## 2016-11-30 NOTE — Telephone Encounter (Signed)
Please review and advise.

## 2016-11-30 NOTE — Telephone Encounter (Signed)
Spoke with patient and she does not have an appointment yet, unsure of when it will be.  Note given extending leave and placed at front for pick up.

## 2016-11-30 NOTE — Telephone Encounter (Signed)
Yes go ahead and do the note until she can get in with them.

## 2016-12-02 ENCOUNTER — Telehealth: Payer: Self-pay | Admitting: Family Medicine

## 2016-12-03 NOTE — Telephone Encounter (Signed)
Spoke with pt. She is aware that she will be hearing from Interventional Radiology at [facility].They are waiting on Dr. Estanislado Pandy to review MRI

## 2016-12-06 ENCOUNTER — Other Ambulatory Visit (HOSPITAL_COMMUNITY): Payer: Self-pay | Admitting: Interventional Radiology

## 2016-12-06 DIAGNOSIS — S22000A Wedge compression fracture of unspecified thoracic vertebra, initial encounter for closed fracture: Secondary | ICD-10-CM

## 2016-12-06 IMAGING — CR DG CERVICAL SPINE COMPLETE 4+V
5 series · 5 of 5 positions shown · non-contrast
Comparison: None.

CLINICAL DATA: Bilateral arm pain

EXAM:
CERVICAL SPINE - COMPLETE 4+ VIEW

[view not recorded (1 of 5)]
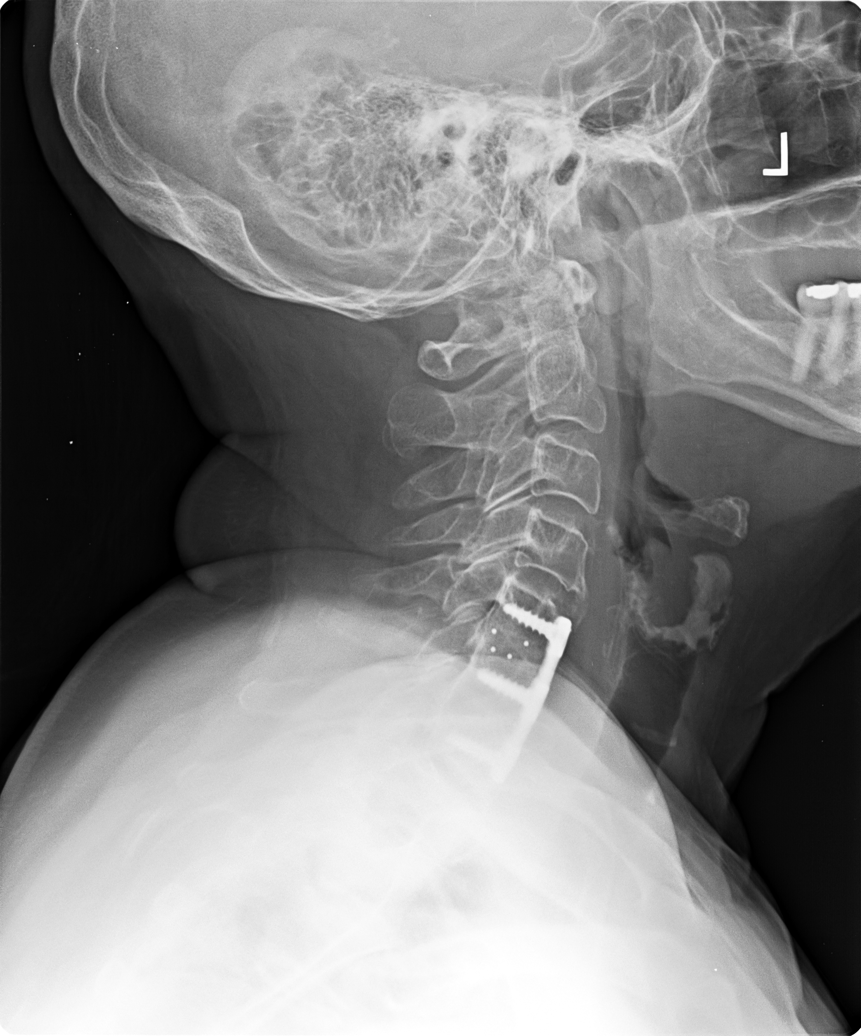

[view not recorded (2 of 5)]
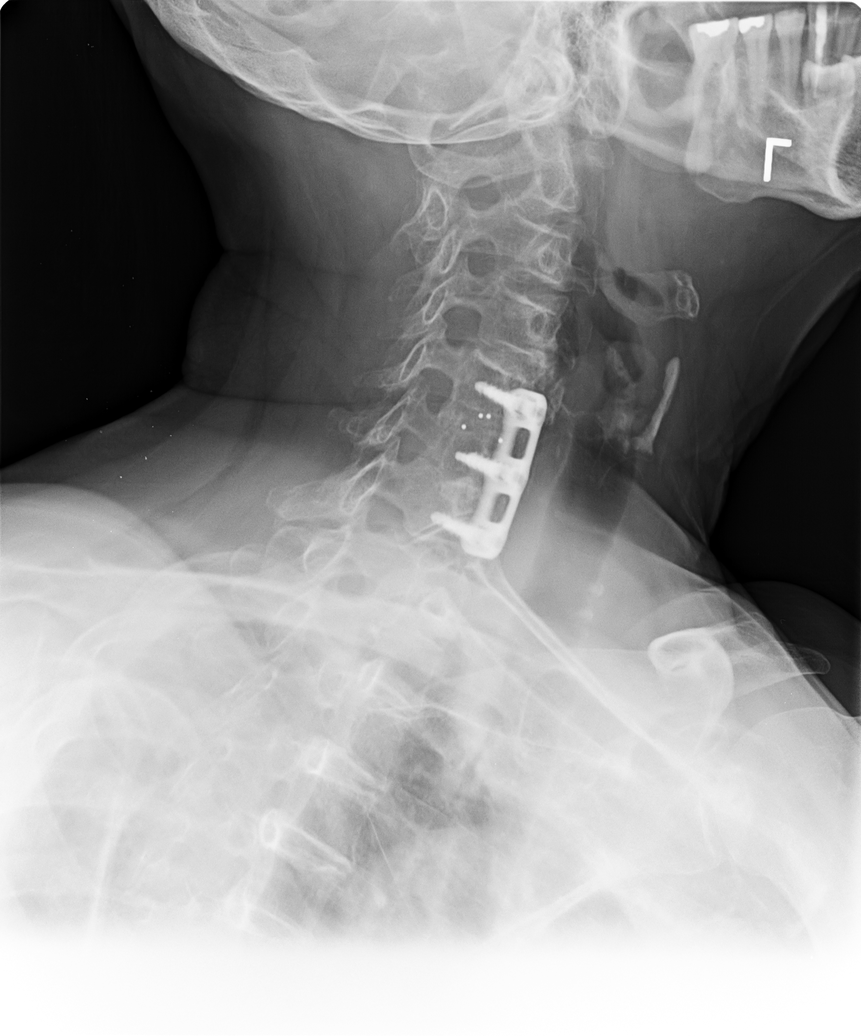

[view not recorded (3 of 5)]
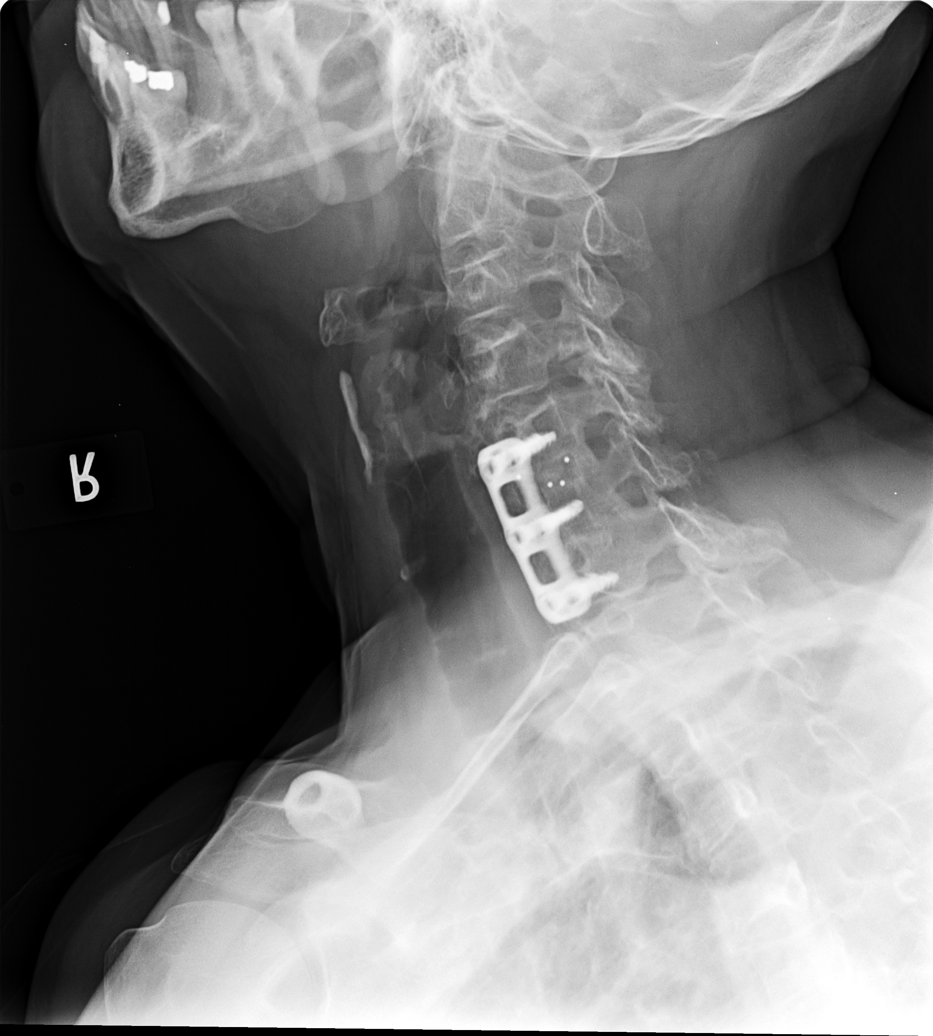

[view not recorded (4 of 5)]
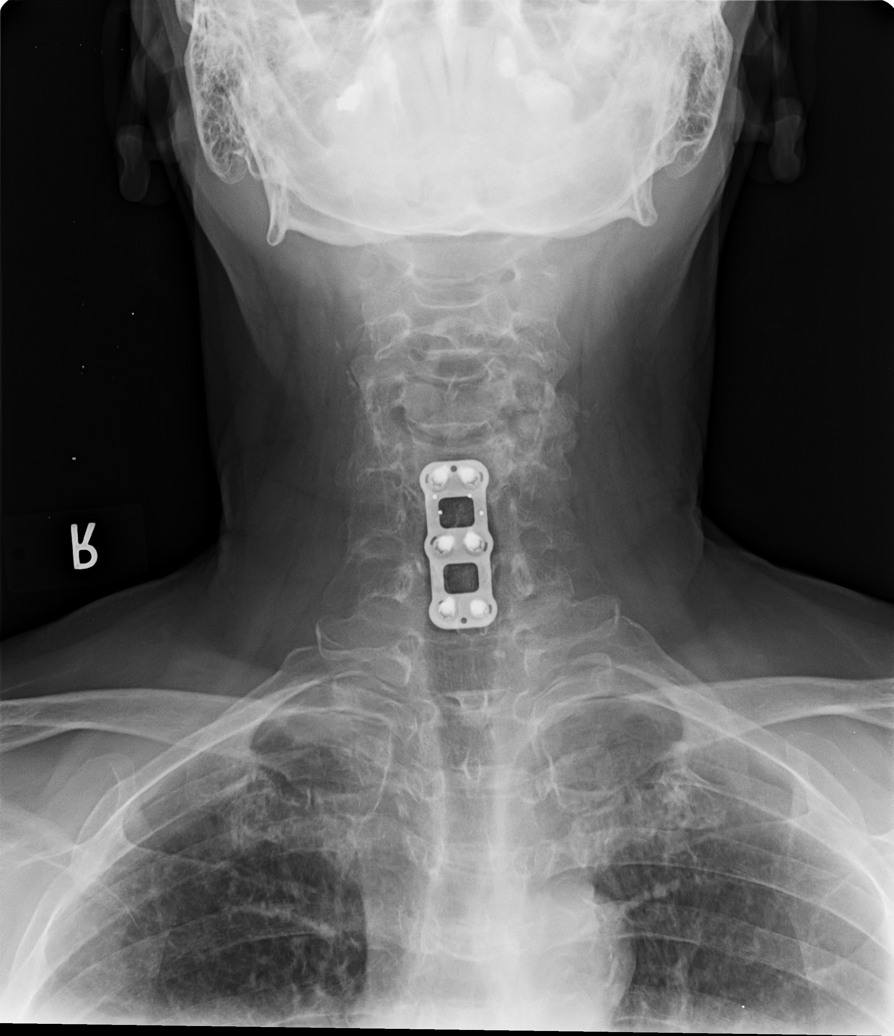

[view not recorded (5 of 5)]
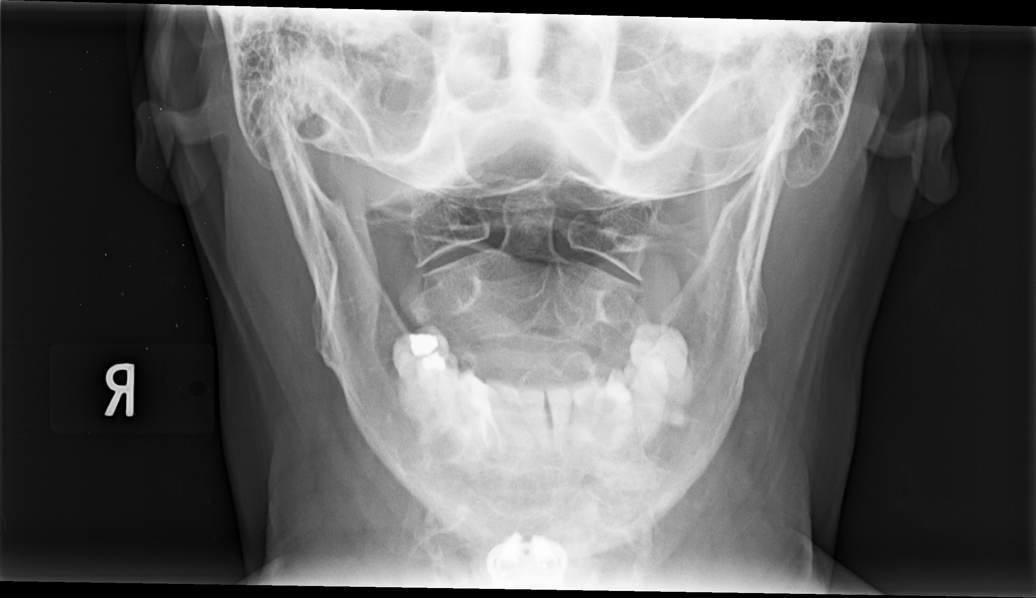

[5 of 5 positions shown; findings below may reference images not displayed]

FINDINGS: Prior cervical fusion is noted from C5-C7. The neural foramina are
widely patent on the right. Narrowing is noted on the left at C4-5
and C5-6. Facet hypertrophic changes are noted. No abnormality of
the odontoid is seen. No soft tissue changes are seen. Osteophytic
changes are noted at C4-5 anteriorly.
IMPRESSION: Degenerative changes and postsurgical changes. No acute abnormality
noted.

## 2016-12-07 ENCOUNTER — Other Ambulatory Visit: Payer: Self-pay | Admitting: Radiology

## 2016-12-08 ENCOUNTER — Other Ambulatory Visit: Payer: Self-pay | Admitting: Family Medicine

## 2016-12-08 ENCOUNTER — Ambulatory Visit
Admission: RE | Admit: 2016-12-08 | Discharge: 2016-12-08 | Disposition: A | Discharge status: Home / Self Care | Payer: Medicare Other | Source: Ambulatory Visit | Attending: Family Medicine | Admitting: Family Medicine

## 2016-12-08 ENCOUNTER — Other Ambulatory Visit: Payer: Self-pay | Admitting: Radiology

## 2016-12-08 DIAGNOSIS — N6489 Other specified disorders of breast: Secondary | ICD-10-CM | POA: Diagnosis not present

## 2016-12-08 DIAGNOSIS — R922 Inconclusive mammogram: Secondary | ICD-10-CM | POA: Diagnosis not present

## 2016-12-08 DIAGNOSIS — N6453 Retraction of nipple: Secondary | ICD-10-CM

## 2016-12-09 ENCOUNTER — Ambulatory Visit (HOSPITAL_COMMUNITY)
Admission: RE | Admit: 2016-12-09 | Discharge: 2016-12-09 | Disposition: A | Discharge status: Home / Self Care | Payer: Medicare Other | Source: Ambulatory Visit | Attending: Interventional Radiology | Admitting: Interventional Radiology

## 2016-12-09 DIAGNOSIS — S22000A Wedge compression fracture of unspecified thoracic vertebra, initial encounter for closed fracture: Secondary | ICD-10-CM

## 2016-12-09 DIAGNOSIS — M545 Low back pain: Secondary | ICD-10-CM | POA: Diagnosis not present

## 2016-12-09 DIAGNOSIS — M4854XA Collapsed vertebra, not elsewhere classified, thoracic region, initial encounter for fracture: Secondary | ICD-10-CM | POA: Diagnosis not present

## 2016-12-09 HISTORY — PX: IR GENERIC HISTORICAL: IMG1180011

## 2016-12-10 ENCOUNTER — Encounter (HOSPITAL_COMMUNITY): Payer: Self-pay | Admitting: Interventional Radiology

## 2016-12-10 ENCOUNTER — Ambulatory Visit (HOSPITAL_COMMUNITY)
Admission: RE | Admit: 2016-12-10 | Discharge: 2016-12-10 | Disposition: A | Discharge status: Home / Self Care | Payer: Medicare Other | Source: Ambulatory Visit | Attending: Interventional Radiology | Admitting: Interventional Radiology

## 2016-12-10 DIAGNOSIS — I10 Essential (primary) hypertension: Secondary | ICD-10-CM | POA: Insufficient documentation

## 2016-12-10 DIAGNOSIS — M545 Low back pain: Secondary | ICD-10-CM | POA: Diagnosis not present

## 2016-12-10 DIAGNOSIS — S22000A Wedge compression fracture of unspecified thoracic vertebra, initial encounter for closed fracture: Secondary | ICD-10-CM

## 2016-12-10 DIAGNOSIS — M4854XA Collapsed vertebra, not elsewhere classified, thoracic region, initial encounter for fracture: Secondary | ICD-10-CM | POA: Insufficient documentation

## 2016-12-10 DIAGNOSIS — F329 Major depressive disorder, single episode, unspecified: Secondary | ICD-10-CM | POA: Diagnosis not present

## 2016-12-10 HISTORY — PX: IR GENERIC HISTORICAL: IMG1180011

## 2016-12-10 LAB — BASIC METABOLIC PANEL
Anion gap: 9 (ref 5–15)
BUN: 18 mg/dL (ref 6–20)
CO2: 26 mmol/L (ref 22–32)
Calcium: 9.2 mg/dL (ref 8.9–10.3)
Chloride: 107 mmol/L (ref 101–111)
Creatinine, Ser: 0.77 mg/dL (ref 0.44–1.00)
GFR calc Af Amer: >60 mL/min (ref >60)
GFR calc non Af Amer: >60 mL/min (ref >60)
Glucose, Bld: 107 mg/dL — ABNORMAL HIGH (ref 65–99)
Potassium: 3.1 mmol/L — ABNORMAL LOW (ref 3.5–5.1)
Sodium: 142 mmol/L (ref 135–145)

## 2016-12-10 LAB — CBC
HCT: 33.4 % — ABNORMAL LOW (ref 36.0–46.0)
Hemoglobin: 10.1 g/dL — ABNORMAL LOW (ref 12.0–15.0)
MCH: 25.1 pg — ABNORMAL LOW (ref 26.0–34.0)
MCHC: 30.2 g/dL (ref 30.0–36.0)
MCV: 82.9 fL (ref 78.0–100.0)
Platelets: 488 10*3/uL — ABNORMAL HIGH (ref 150–400)
RBC: 4.03 MIL/uL (ref 3.87–5.11)
RDW: 15.8 % — ABNORMAL HIGH (ref 11.5–15.5)
WBC: 6.9 10*3/uL (ref 4.0–10.5)

## 2016-12-10 LAB — PROTIME-INR
INR: 1.02
Prothrombin Time: 13.4 seconds (ref 11.4–15.2)

## 2016-12-10 LAB — APTT: aPTT: 27 seconds (ref 24–36)

## 2016-12-10 MED ORDER — TOBRAMYCIN SULFATE 1.2 G IJ SOLR
INTRAMUSCULAR | Status: AC | PRN
  Administered 2016-12-10: 1.2 g via TOPICAL

## 2016-12-10 MED ORDER — POTASSIUM CHLORIDE CRYS ER 20 MEQ PO TBCR
EXTENDED_RELEASE_TABLET | ORAL | Status: AC
  Filled 2016-12-10: qty 2

## 2016-12-10 MED ORDER — POTASSIUM CHLORIDE CRYS ER 20 MEQ PO TBCR
40.0000 meq | EXTENDED_RELEASE_TABLET | Freq: Once | ORAL | Status: AC
  Administered 2016-12-10: 40 meq via ORAL

## 2016-12-10 MED ORDER — FENTANYL CITRATE (PF) 100 MCG/2ML IJ SOLN
INTRAMUSCULAR | Status: AC | PRN
  Administered 2016-12-10 (×3): 25 ug via INTRAVENOUS

## 2016-12-10 MED ORDER — MIDAZOLAM HCL 2 MG/2ML IJ SOLN
INTRAMUSCULAR | Status: AC | PRN
  Administered 2016-12-10 (×2): 1 mg via INTRAVENOUS

## 2016-12-10 MED ORDER — SODIUM CHLORIDE 0.9 % IV SOLN: INTRAVENOUS | Status: DC

## 2016-12-10 MED ORDER — BUPIVACAINE HCL 0.25 % IJ SOLN
INTRAMUSCULAR | Status: AC | PRN
  Administered 2016-12-10: 15 mL

## 2016-12-10 MED ORDER — IOPAMIDOL (ISOVUE-300) INJECTION 61%
INTRAVENOUS | Status: AC
  Administered 2016-12-10: 10 mL
  Filled 2016-12-10: qty 50

## 2016-12-10 MED ORDER — MIDAZOLAM HCL 2 MG/2ML IJ SOLN
INTRAMUSCULAR | Status: AC
  Filled 2016-12-10: qty 4

## 2016-12-10 MED ORDER — HYDROCODONE-ACETAMINOPHEN 5-325 MG PO TABS
1.0000 | ORAL_TABLET | ORAL | Status: DC | PRN
  Administered 2016-12-10: 1 via ORAL

## 2016-12-10 MED ORDER — CEFAZOLIN SODIUM-DEXTROSE 2-4 GM/100ML-% IV SOLN
2.0000 g | INTRAVENOUS | Status: AC
  Administered 2016-12-10: 2 g via INTRAVENOUS

## 2016-12-10 MED ORDER — HYDROMORPHONE HCL 1 MG/ML IJ SOLN
INTRAMUSCULAR | Status: AC
  Filled 2016-12-10: qty 1

## 2016-12-10 MED ORDER — CEFAZOLIN SODIUM-DEXTROSE 2-4 GM/100ML-% IV SOLN
INTRAVENOUS | Status: AC
  Filled 2016-12-10: qty 100

## 2016-12-10 MED ORDER — BUPIVACAINE HCL (PF) 0.25 % IJ SOLN
INTRAMUSCULAR | Status: AC
  Filled 2016-12-10: qty 30

## 2016-12-10 MED ORDER — TOBRAMYCIN SULFATE 1.2 G IJ SOLR
INTRAMUSCULAR | Status: AC
  Filled 2016-12-10: qty 1.2

## 2016-12-10 MED ORDER — FENTANYL CITRATE (PF) 100 MCG/2ML IJ SOLN
INTRAMUSCULAR | Status: AC
  Filled 2016-12-10: qty 4

## 2016-12-10 MED ORDER — SODIUM CHLORIDE 0.9 % IV SOLN: INTRAVENOUS | Status: AC

## 2016-12-10 MED ORDER — SODIUM CHLORIDE 0.9 % IV SOLN
INTRAVENOUS | Status: AC | PRN
  Administered 2016-12-10: 10 mL/h via INTRAVENOUS

## 2016-12-10 MED ORDER — HYDROCODONE-ACETAMINOPHEN 5-325 MG PO TABS
ORAL_TABLET | ORAL | Status: AC
  Filled 2016-12-10: qty 1

## 2016-12-10 NOTE — Sedation Documentation (Signed)
Called to give report. Nurse unavailable. Will call back 

## 2016-12-10 NOTE — Discharge Instructions (Signed)
KYPHOPLASTY/VERTEBROPLASTY DISCHARGE INSTRUCTIONS  Medications: (check all that apply)     Resume all home medications as before procedure.                        Continue your pain medications as prescribed as needed.  Over the next 3-5 days, decrease your pain medication as tolerated.  Over the counter medications (i.e. Tylenol, ibuprofen, and aleve) may be substituted once severe/moderate pain symptoms have subsided.   Wound Care: - Bandages may be removed the day following your procedure.  You may get your incision wet once bandages are removed.  Bandaids may be used to cover the incisions until scab formation.  Topical ointments are optional.  - If you develop a fever greater than 101 degrees, have increased skin redness at the incision sites or pus-like oozing from incisions occurring within 1 week of the procedure, contact radiology at [phone] or [phone].  - Ice pack to back for 15-20 minutes 2-3 time per day for first 2-3 days post procedure.  The ice will expedite muscle healing and help with the pain from the incisions.   Activity: - Bedrest today with limited activity for 24 hours post procedure.  - No driving for 48 hours.  - Increase your activity as tolerated after bedrest (with assistance if necessary).  - Refrain from any strenuous activity or heavy lifting (greater than 10 lbs.).   Follow up: - Contact radiology at [phone] or [phone] if any questions/concerns.  - A physician assistant from radiology will contact you in approximately 1 week.  - If a biopsy was performed at the time of your procedure, your referring physician should receive the results in usually 2-3 days.        1.No stooping,bending,lifting more than 10 lbs for 2 weeks 2.Use walker to ambulate for 2 weeks. 3.RTC in 2 weeks.PRN

## 2016-12-10 NOTE — H&P (Signed)
Referring Physician(s): Dettinger,J  Supervising Physician: Luanne Bras  Patient Status:  [facility] OP  Chief Complaint:  Back pain, T11 fracture  Subjective: Pt familiar to Jody Wilson service from consultation with Dr. Dr. Estanislado Pandy on 12/09/16 regarding treatment options for her newly diagnosed T11 compression fracture. See consultation note for additional details. She was deemed an appropriate candidate for T11 vertebral body augmentation and presents today for the procedure. She denies any recent chills, fevers , dysuria, hematuria, polyuria or bowel or bladder dysfunction. She does have mid to low back pain. Past Medical History:  Diagnosis Date  . Depression   . Hypertension    Past Surgical History:  Procedure Laterality Date  . ABDOMINAL HYSTERECTOMY    . CHOLECYSTECTOMY    . FOOT SURGERY     7 in the past  . TRACHEOSTOMY TUBE PLACEMENT  1982      Allergies: Tetracyclines & related  Medications: Prior to Admission medications   Medication Sig Start Date End Date Taking? Authorizing Provider  ALPRAZolam Duanne Moron) 0.5 MG tablet Take 1 tablet (0.5 mg total) by mouth 2 (two) times daily as needed for anxiety. 11/19/16  Yes Fransisca Kaufmann Dettinger, MD  hydrochlorothiazide (MICROZIDE) 12.5 MG capsule TAKE (1) CAPSULE DAILY 09/16/16  Yes Fransisca Kaufmann Dettinger, MD  HYDROcodone-acetaminophen (NORCO) 5-325 MG tablet Take 1 tablet by mouth every 6 (six) hours as needed for moderate pain. 11/26/16  Yes Fransisca Kaufmann Dettinger, MD  L-Lysine 500 MG TABS Take 1 tablet by mouth daily.   Yes Historical Provider, MD  olmesartan (BENICAR) 40 MG tablet Take 1 tablet (40 mg total) by mouth daily. Patient taking differently: Take 20 mg by mouth daily.  09/16/16  Yes Fransisca Kaufmann Dettinger, MD  pantoprazole (PROTONIX) 40 MG tablet Take 1 tablet (40 mg total) by mouth daily. 09/16/16  Yes Fransisca Kaufmann Dettinger, MD  sertraline (ZOLOFT) 50 MG tablet Take 1 tablet (50 mg total) by mouth daily. 09/16/16  Yes Fransisca Kaufmann  Dettinger, MD  tiZANidine (ZANAFLEX) 4 MG tablet Take 1 tablet (4 mg total) by mouth every 6 (six) hours as needed for muscle spasms. 11/26/16  Yes Fransisca Kaufmann Dettinger, MD  albuterol (PROVENTIL HFA;VENTOLIN HFA) 108 (90 BASE) MCG/ACT inhaler Inhale 2 puffs into the lungs every 6 (six) hours as needed for wheezing or shortness of breath. 03/18/14   Chipper Herb, MD  valACYclovir (VALTREX) 1000 MG tablet Take 1 tablet (1,000 mg total) by mouth 2 (two) times daily. Patient taking differently: Take 1,000 mg by mouth 2 (two) times daily as needed (Cold sores).  09/16/16   Fransisca Kaufmann Dettinger, MD     Vital Signs: BP (!) 167/85   Pulse 62   Temp 98 F (36.7 C) (Oral)   Resp 18   Ht 5\' 6"  (1.676 m)   Wt 160 lb (72.6 kg)   SpO2 96%   BMI 25.82 kg/m   Physical Exam awake, alert. Chest clear to auscultation bilaterally. Heart with regular rate and rhythm. Abdomen soft, positive bowel sounds, nontender. Mild to moderate paravertebral tenderness at T11 region with radiation to left of midline. Lower extremities with no edema.  Imaging: US Breast Ltd Uni Left Inc Axilla  Result Date: 12/08/2016 CLINICAL DATA:  Bilateral nipple inversion, permanent on the right and intermittent on the left, noted by the patient in the last few months. EXAM: 2D DIGITAL DIAGNOSTIC BILATERAL MAMMOGRAM WITH CAD AND ADJUNCT TOMO ULTRASOUND BILATERAL BREAST COMPARISON:  Previous exam(s). ACR Breast Density Category c: The breast  tissue is heterogeneously dense, which may obscure small masses. FINDINGS: Mammographically, there are no suspicious masses, areas of architectural distortion or microcalcifications in either breast. Complete nipple inversion is seen on the right and partial nipple inversion is seen on the left. Mammographic images were processed with CAD. On physical exam, no suspicious masses of found. Targeted ultrasound is performed, showing no suspicious masses or shadowing lesions. There is a left breast 2 o'clock 5  cm from the nipple benign-appearing cyst measuring 0.8 x 0.5 x 1.0 cm. IMPRESSION: No suspicious masses seen mammographically or sonographically. However, there is a notable nipple inversion, particularly of the right breast. Therefore, further evaluation with breast MRI is recommended. RECOMMENDATION: Contrast-enhanced breast MRI for a bilateral nipple inversion. I have discussed the findings and recommendations with the patient. Results were also provided in writing at the conclusion of the visit. If applicable, a reminder letter will be sent to the patient regarding the next appointment. BI-RADS CATEGORY  0: Incomplete. Need additional imaging evaluation and/or prior mammograms for comparison. Electronically Signed   By: Fidela Salisbury M.D.   On: 12/08/2016 11:21   US Breast Ltd Uni Right Inc Axilla  Result Date: 12/08/2016 CLINICAL DATA:  Bilateral nipple inversion, permanent on the right and intermittent on the left, noted by the patient in the last few months. EXAM: 2D DIGITAL DIAGNOSTIC BILATERAL MAMMOGRAM WITH CAD AND ADJUNCT TOMO ULTRASOUND BILATERAL BREAST COMPARISON:  Previous exam(s). ACR Breast Density Category c: The breast tissue is heterogeneously dense, which may obscure small masses. FINDINGS: Mammographically, there are no suspicious masses, areas of architectural distortion or microcalcifications in either breast. Complete nipple inversion is seen on the right and partial nipple inversion is seen on the left. Mammographic images were processed with CAD. On physical exam, no suspicious masses of found. Targeted ultrasound is performed, showing no suspicious masses or shadowing lesions. There is a left breast 2 o'clock 5 cm from the nipple benign-appearing cyst measuring 0.8 x 0.5 x 1.0 cm. IMPRESSION: No suspicious masses seen mammographically or sonographically. However, there is a notable nipple inversion, particularly of the right breast. Therefore, further evaluation with breast MRI  is recommended. RECOMMENDATION: Contrast-enhanced breast MRI for a bilateral nipple inversion. I have discussed the findings and recommendations with the patient. Results were also provided in writing at the conclusion of the visit. If applicable, a reminder letter will be sent to the patient regarding the next appointment. BI-RADS CATEGORY  0: Incomplete. Need additional imaging evaluation and/or prior mammograms for comparison. Electronically Signed   By: Fidela Salisbury M.D.   On: 12/08/2016 11:21   Mm Diag Breast Tomo Bilateral  Result Date: 12/08/2016 CLINICAL DATA:  Bilateral nipple inversion, permanent on the right and intermittent on the left, noted by the patient in the last few months. EXAM: 2D DIGITAL DIAGNOSTIC BILATERAL MAMMOGRAM WITH CAD AND ADJUNCT TOMO ULTRASOUND BILATERAL BREAST COMPARISON:  Previous exam(s). ACR Breast Density Category c: The breast tissue is heterogeneously dense, which may obscure small masses. FINDINGS: Mammographically, there are no suspicious masses, areas of architectural distortion or microcalcifications in either breast. Complete nipple inversion is seen on the right and partial nipple inversion is seen on the left. Mammographic images were processed with CAD. On physical exam, no suspicious masses of found. Targeted ultrasound is performed, showing no suspicious masses or shadowing lesions. There is a left breast 2 o'clock 5 cm from the nipple benign-appearing cyst measuring 0.8 x 0.5 x 1.0 cm. IMPRESSION: No suspicious masses seen mammographically or sonographically.  However, there is a notable nipple inversion, particularly of the right breast. Therefore, further evaluation with breast MRI is recommended. RECOMMENDATION: Contrast-enhanced breast MRI for a bilateral nipple inversion. I have discussed the findings and recommendations with the patient. Results were also provided in writing at the conclusion of the visit. If applicable, a reminder letter will be  sent to the patient regarding the next appointment. BI-RADS CATEGORY  0: Incomplete. Need additional imaging evaluation and/or prior mammograms for comparison. Electronically Signed   By: Fidela Salisbury M.D.   On: 12/08/2016 11:21    Labs:  CBC:  Recent Labs  04/05/16 1312 12/10/16 0644  WBC 8.0 6.9  HGB  --  10.1*  HCT 36.4 33.4*  PLT 510* 488*    COAGS:  Recent Labs  12/10/16 0644  INR 1.02  APTT 27    BMP:  Recent Labs  10/02/16 0937 12/10/16 0644  NA 145* 142  K 4.6 3.1*  CL 105 107  CO2 26 26  GLUCOSE 106* 107*  BUN 15 18  CALCIUM 9.4 9.2  CREATININE 0.75 0.77  GFRNONAA 85 >60  GFRAA 97 >60    LIVER FUNCTION TESTS:  Recent Labs  10/02/16 0937  BILITOT 0.4  AST 12  ALT 10  ALKPHOS 114  PROT 6.7  ALBUMIN 4.1    Assessment and Plan: Patient with history of symptomatic T11 compression fracture. Seen recently in consultation by Dr. Dr. Estanislado Pandy and deemed an appropriate candidate for T 11 vertebral body augmentation. She presents today for the procedure.Risks and benefits discussed with the patient including, but not limited to education regarding the natural healing process of compression fractures without intervention, bleeding, infection, cement migration which may cause spinal cord damage, paralysis, pulmonary embolism or even death. All of the patient's questions were answered, patient is agreeable to proceed. Consent signed and in chart. K today 3.1- will give oral dose of KDur.      Electronically Signed: D. Rowe Robert 12/10/2016, 7:57 AM   I spent a total of 25 minutes at the the patient's bedside AND on the patient's hospital floor or unit, greater than 50% of which was counseling/coordinating care for T11 vertebral body augmentation

## 2016-12-10 NOTE — Procedures (Signed)
S/P T 11 VP 

## 2016-12-11 ENCOUNTER — Encounter (HOSPITAL_COMMUNITY): Payer: Self-pay | Admitting: Interventional Radiology

## 2016-12-14 ENCOUNTER — Telehealth (HOSPITAL_COMMUNITY): Payer: Self-pay

## 2016-12-14 NOTE — Telephone Encounter (Signed)
Pt does not want to schedule f/u at this time.  She will call if things change. She feels sore but not in any pain. AW

## 2016-12-18 ENCOUNTER — Other Ambulatory Visit: Payer: Medicare Other

## 2016-12-23 ENCOUNTER — Telehealth (HOSPITAL_COMMUNITY): Payer: Self-pay

## 2016-12-23 NOTE — Telephone Encounter (Signed)
Returned pt's phone call. AW 

## 2016-12-25 ENCOUNTER — Ambulatory Visit
Admission: RE | Admit: 2016-12-25 | Discharge: 2016-12-25 | Disposition: A | Discharge status: Home / Self Care | Payer: Medicare Other | Source: Ambulatory Visit | Attending: Family Medicine | Admitting: Family Medicine

## 2016-12-25 DIAGNOSIS — N6453 Retraction of nipple: Secondary | ICD-10-CM

## 2016-12-25 DIAGNOSIS — N6489 Other specified disorders of breast: Secondary | ICD-10-CM | POA: Diagnosis not present

## 2016-12-25 MED ORDER — GADOBENATE DIMEGLUMINE 529 MG/ML IV SOLN
15.0000 mL | Freq: Once | INTRAVENOUS | Status: AC | PRN
  Administered 2016-12-25: 14 mL via INTRAVENOUS

## 2016-12-28 ENCOUNTER — Other Ambulatory Visit: Payer: Self-pay | Admitting: *Deleted

## 2016-12-28 DIAGNOSIS — N6459 Other signs and symptoms in breast: Secondary | ICD-10-CM

## 2016-12-29 DIAGNOSIS — S22080A Wedge compression fracture of T11-T12 vertebra, initial encounter for closed fracture: Secondary | ICD-10-CM | POA: Diagnosis not present

## 2016-12-29 DIAGNOSIS — Z6827 Body mass index (BMI) 27.0-27.9, adult: Secondary | ICD-10-CM | POA: Diagnosis not present

## 2016-12-29 DIAGNOSIS — Z4689 Encounter for fitting and adjustment of other specified devices: Secondary | ICD-10-CM | POA: Diagnosis not present

## 2016-12-29 DIAGNOSIS — M549 Dorsalgia, unspecified: Secondary | ICD-10-CM | POA: Diagnosis not present

## 2017-01-07 ENCOUNTER — Encounter: Payer: Self-pay | Admitting: Family Medicine

## 2017-01-07 ENCOUNTER — Ambulatory Visit (INDEPENDENT_AMBULATORY_CARE_PROVIDER_SITE_OTHER): Payer: Medicare Other | Admitting: Family Medicine

## 2017-01-07 VITALS — BP 136/83 | HR 66 | Temp 98.0°F | Ht 67.0 in | Wt 156.4 lb

## 2017-01-07 DIAGNOSIS — J441 Chronic obstructive pulmonary disease with (acute) exacerbation: Secondary | ICD-10-CM

## 2017-01-07 DIAGNOSIS — M4850XS Collapsed vertebra, not elsewhere classified, site unspecified, sequela of fracture: Secondary | ICD-10-CM

## 2017-01-07 DIAGNOSIS — IMO0001 Reserved for inherently not codable concepts without codable children: Secondary | ICD-10-CM

## 2017-01-07 MED ORDER — PREDNISONE 20 MG PO TABS: ORAL_TABLET | ORAL | 0 refills | Status: DC

## 2017-01-07 MED ORDER — BACLOFEN 10 MG PO TABS: 10.0000 mg | ORAL_TABLET | Freq: Three times a day (TID) | ORAL | 2 refills | Status: DC

## 2017-01-07 MED ORDER — ALBUTEROL SULFATE HFA 108 (90 BASE) MCG/ACT IN AERS: 2.0000 | INHALATION_SPRAY | Freq: Four times a day (QID) | RESPIRATORY_TRACT | 2 refills | Status: DC | PRN

## 2017-01-07 MED ORDER — AZITHROMYCIN 250 MG PO TABS: ORAL_TABLET | ORAL | 0 refills | Status: DC

## 2017-01-07 NOTE — Progress Notes (Signed)
BP 136/83   Pulse 66   Temp 98 F (36.7 C) (Oral)   Ht 5\' 7"  (1.702 m)   Wt 156 lb 6.4 oz (70.9 kg)   BMI 24.50 kg/m    Subjective:    Patient ID: Jody Wilson, female    DOB: 02-17-1951, 66 y.o.   MRN: [id number]  HPI: Jody Wilson is a 66 y.o. female presenting on 01/07/2017 for Back Pain (zanaflex wonders if it is causing facial tremors that she is having when she lays down at night, ibuprofen & zanaflex only, no pain meds); Breast Cancer (inverted nipples); Dizziness; Shortness of Breath (for the last week); and Cough (all night long, last day has been productive, no known fever, chills & sweats)   HPI Back pain recheck. Patient is coming in for back pain recheck. She is currently using ibuprofen and Zanaflex and has been doing mostly well on that except for that she feels the Zanaflex is causing her to have facial tremors when she lays down at night and she has twitches in that side of her face. She is wondering whether there is another option that she could try relevant and Zanaflex that she feels like it is causing her to have some side effects. She also understands that the facial twitching could be from anxiety that she's been having especially when she has been dealing with breast cancer possibility from inverted nipples. She has also had a lot of increased stress from other health issues that she's been dealing with as well.  Cough and wheezing and sinus pressure and dizziness Patient has been having a cough and congestion and wheezing and dizziness has been going on for the past week. She also has sinus pressure and sinus headache. She denies any shortness of breath but has had the wheezing going on. Her cough has been mostly dry and nonproductive.  Relevant past medical, surgical, family and social history reviewed and updated as indicated. Interim medical history since our last visit reviewed. Allergies and medications reviewed and updated.  Review of Systems    Constitutional: Negative for chills and fever.  HENT: Positive for congestion, postnasal drip, rhinorrhea, sinus pressure, sneezing and sore throat. Negative for ear discharge and ear pain.   Eyes: Negative for pain, redness and visual disturbance.  Respiratory: Positive for cough and wheezing. Negative for chest tightness and shortness of breath.   Cardiovascular: Negative for chest pain and leg swelling.  Genitourinary: Negative for difficulty urinating and dysuria.  Musculoskeletal: Positive for back pain and myalgias. Negative for gait problem.  Skin: Negative for rash.  Neurological: Negative for light-headedness and headaches.  Psychiatric/Behavioral: Negative for agitation and behavioral problems.  All other systems reviewed and are negative.   Per HPI unless specifically indicated above     Objective:    BP 136/83   Pulse 66   Temp 98 F (36.7 C) (Oral)   Ht 5\' 7"  (1.702 m)   Wt 156 lb 6.4 oz (70.9 kg)   BMI 24.50 kg/m   Wt Readings from Last 3 Encounters:  01/07/17 156 lb 6.4 oz (70.9 kg)  12/10/16 160 lb (72.6 kg)  11/19/16 163 lb 6 oz (74.1 kg)    Physical Exam  Constitutional: She is oriented to person, place, and time. She appears well-developed and well-nourished. No distress.  HENT:  Right Ear: Tympanic membrane, external ear and ear canal normal.  Left Ear: Tympanic membrane, external ear and ear canal normal.  Nose: Mucosal edema and rhinorrhea  present. No epistaxis. Right sinus exhibits no maxillary sinus tenderness and no frontal sinus tenderness. Left sinus exhibits no maxillary sinus tenderness and no frontal sinus tenderness.  Mouth/Throat: Uvula is midline and mucous membranes are normal. Posterior oropharyngeal edema and posterior oropharyngeal erythema present. No oropharyngeal exudate or tonsillar abscesses.  Eyes: Conjunctivae and EOM are normal.  Neck: Neck supple. No thyromegaly present.  Cardiovascular: Normal rate, regular rhythm, normal heart  sounds and intact distal pulses.   No murmur heard. Pulmonary/Chest: Effort normal and breath sounds normal. No respiratory distress. She has no wheezes. She has no rales.  Musculoskeletal: Normal range of motion. She exhibits tenderness (Low back bilateral tenderness, negative straight leg raise bilaterally). She exhibits no edema.  Neurological: She is alert and oriented to person, place, and time. Coordination normal.  Skin: Skin is warm and dry. No rash noted. She is not diaphoretic.  Psychiatric: She has a normal mood and affect. Her behavior is normal.  Nursing note and vitals reviewed.     Assessment & Plan:   Problem List Items Addressed This Visit      Respiratory   COPD exacerbation ([place])   Relevant Medications   predniSONE (DELTASONE) 20 MG tablet   azithromycin (ZITHROMAX) 250 MG tablet   albuterol (PROVENTIL HFA;VENTOLIN HFA) 108 (90 Base) MCG/ACT inhaler   Other Relevant Orders   DG Bone Density    Other Visit Diagnoses    Vertebral compression fracture, sequela    -  Primary   Relevant Medications   baclofen (LIORESAL) 10 MG tablet       Follow up plan: Return in about 3 months (around 04/07/2017), or if symptoms worsen or fail to improve, for Patient needs an appointment with [facility] for osteoporosis after DEXA scan.  Counseling provided for all of the vaccine components Orders Placed This Encounter  Procedures  . DG Bone Density    [name], MD [facility] 01/07/2017, 10:48 AM

## 2017-01-12 DIAGNOSIS — N6459 Other signs and symptoms in breast: Secondary | ICD-10-CM | POA: Diagnosis not present

## 2017-01-26 DIAGNOSIS — M546 Pain in thoracic spine: Secondary | ICD-10-CM | POA: Diagnosis not present

## 2017-01-26 DIAGNOSIS — Z6825 Body mass index (BMI) 25.0-25.9, adult: Secondary | ICD-10-CM | POA: Diagnosis not present

## 2017-01-26 DIAGNOSIS — S22080A Wedge compression fracture of T11-T12 vertebra, initial encounter for closed fracture: Secondary | ICD-10-CM | POA: Diagnosis not present

## 2017-01-31 ENCOUNTER — Ambulatory Visit (INDEPENDENT_AMBULATORY_CARE_PROVIDER_SITE_OTHER): Payer: Medicare Other

## 2017-01-31 DIAGNOSIS — J441 Chronic obstructive pulmonary disease with (acute) exacerbation: Secondary | ICD-10-CM

## 2017-01-31 DIAGNOSIS — Z780 Asymptomatic menopausal state: Secondary | ICD-10-CM | POA: Diagnosis not present

## 2017-02-01 ENCOUNTER — Ambulatory Visit: Payer: Medicare Other | Attending: Orthopaedic Surgery | Admitting: Physical Therapy

## 2017-02-01 DIAGNOSIS — M6281 Muscle weakness (generalized): Secondary | ICD-10-CM | POA: Diagnosis not present

## 2017-02-01 DIAGNOSIS — M546 Pain in thoracic spine: Secondary | ICD-10-CM | POA: Insufficient documentation

## 2017-02-01 NOTE — Therapy (Signed)
[facility] [place], [place], [address] Phone: [phone]   Fax:  [phone]  Physical Therapy Evaluation  Patient Details  Name: Jody Wilson MRN: [id number] Date of Birth: 1951-07-18 Referring Provider: Rennis Harding MD  Encounter Date: 02/01/2017      PT End of Session - 02/01/17 1527    Visit Number 1   Number of Visits 8   Date for PT Re-Evaluation 03/15/17   PT Start Time [id number]   PT Stop Time 1536   PT Time Calculation (min) 55 min   Activity Tolerance Patient tolerated treatment well   Behavior During Therapy [facility] for tasks assessed/performed      Past Medical History:  Diagnosis Date  . Depression   . Hypertension     Past Surgical History:  Procedure Laterality Date  . ABDOMINAL HYSTERECTOMY    . CHOLECYSTECTOMY    . FOOT SURGERY     7 in the past  . IR GENERIC HISTORICAL  12/09/2016   IR RADIOLOGIST EVAL & MGMT 12/09/2016 [facility]INTER[facility]  . IR GENERIC HISTORICAL  12/10/2016   IR VERTEBROPLASTY CERV/THOR BX INC UNI/BIL INC/INJECT/IMAGING 12/10/2016 [facility]  . TRACHEOSTOMY TUBE PLACEMENT  1982    There were no vitals filed for this visit.       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 On Dec 1st patient had xray which showed compression fx. 12/10/16 she had vertebralplasty. She had bone density test 01/31/17 and showed -2.9 which is osteoporotic. Patient states she can do some chores and then has to put on brace. This feels muscular.  When she overdoes it it feels like her back is going to break.   Pertinent History Osteoporosis, COPD, HTN   How long can you sit comfortably? 1.5 hours   How long can you walk comfortably? 20 min   Diagnostic tests xrays   Patient Stated Goals to get back to work   Currently in Pain? Yes   Pain Score 5    Pain Location Back   Pain Orientation Mid   Pain Descriptors / Indicators  Aching;Dull   Pain Type Acute pain   Pain Onset More than a month ago   Pain Frequency Constant   Aggravating Factors  prolonged activity   Pain Relieving Factors rest   Effect of Pain on Daily Activities sleeps only 3-4 hours intermittently            [facility] PT Assessment - 02/01/17 0001      Assessment   Medical Diagnosis T11/12 compression fx   Referring Provider Rennis Harding MD   Onset Date/Surgical Date 11/05/17   Next MD Visit 02/14/17     Precautions   Precautions None   Required Braces or Orthoses --  weaning from back  brace     Balance Screen   Has the patient fallen in the past 6 months No   Has the patient had a decrease in activity level because of a fear of falling?  No   Is the patient reluctant to leave their home because of a fear of falling?  No     Prior Function   Level of Independence Independent   Vocation Full time employment   Vocation Requirements standing and retracting with arms     Observation/Other Assessments   Focus on Therapeutic Outcomes (FOTO)  60% limited     Posture/Postural Control   Posture  Comments forward head rounded shoulders, increased thoracic kyphosis, decreased lumbar lordosisi     ROM / Strength   AROM / PROM / Strength Strength;AROM     AROM   Overall AROM Comments lumbar ROM WFL; pain with end range ext     Strength   Overall Strength Comments B shoulders flex/abd/ext 5/5 no pain; hip flex R 4/5, L 5/5; B knee ext/flex 5/5     Flexibility   Soft Tissue Assessment /Muscle Length yes   Hamstrings B   Quadriceps R   Piriformis R   Quadratus Lumborum R                   [facility] Adult PT Treatment/Exercise - 02/01/17 0001      Exercises   Exercises Lumbar     Lumbar Exercises: Prone   Other Prone Lumbar Exercises pelvic press series; knee bends unilateral and B; SLR; mule kick all x 5 ea   Other Prone Lumbar Exercises chin tuck with head shoulder raise x 5     Modalities   Modalities Electrical  Stimulation;Moist Heat     Moist Heat Therapy   Number Minutes Moist Heat 15 Minutes   Moist Heat Location Other (comment)  thoracic spine     Electrical Stimulation   Electrical Stimulation Location T11/12   Electrical Stimulation Action premod   Electrical Stimulation Parameters 80-150 Hz x 15 min   Electrical Stimulation Goals Pain                PT Education - 02/01/17 1527    Education provided Yes   Education Details HEP   Person(s) Educated Patient   Methods Explanation;Demonstration;Handout   Comprehension Verbalized understanding;Returned demonstration             PT Long Term Goals - 02/01/17 1536      PT LONG TERM GOAL #1   Title I with HEP   Time 6   Period Weeks   Status New     PT LONG TERM GOAL #2   Title Patient able to peform ADLS with <= [id number] pain in the back.   Time 6   Period Weeks   Status New     PT LONG TERM GOAL #3   Title Patient able to sleep without waking from pain.   Time 6   Period Weeks   Status New     PT LONG TERM GOAL #4   Title Patient to demo 4+/5 R hip flexion to ease ADLS.   Time 6   Period Weeks   Status New               Plan - 02/01/17 1527    Clinical Impression Statement Patient presents s/p T11/12 compression fx in Nov 2017 and vertebroplasty 12/10/16. She was diagnosed with OP 01/31/17. She is wearing a back brace which she has worn for a month and is in the process of weaning herself from. 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 She has muscle tightness in the RLE and strength deficits as well.    Rehab Potential Excellent   PT Frequency 2x / week   PT Duration 6 weeks   PT Treatment/Interventions ADLs/Self Care Home Management;Electrical Stimulation;Moist Heat;Therapeutic activities;Neuromuscular re-education;Patient/family education;Manual techniques;Dry needling   PT Next Visit Plan Review HEP; gentle STW to lumbar/low thoracic paraspinals  and R QL. Issue stretches for RLE (sktc, piriformis, quad); progress prone thoracic ext  exercises as tolerated. Core strengthening; modalities for pain prn.   PT Home Exercise Plan pelvic press series including cerv retraction; bridge.   Consulted and Agree with Plan of Care Patient      Patient will benefit from skilled therapeutic intervention in order to improve the following deficits and impairments:  Pain, Postural dysfunction, Impaired flexibility, Decreased strength  Visit Diagnosis: Pain in thoracic spine - Plan: PT plan of care cert/re-cert  Muscle weakness (generalized) - Plan: PT plan of care cert/re-cert      G-Codes - [id number]    Functional Assessment Tool Used FOTO 60% LIMITED   Functional Limitation Other PT primary   Other PT Primary Current Status [id number]) At least 60 percent but less than 80 percent impaired, limited or restricted   Other PT Primary Goal Status [id number]) At least 40 percent but less than 60 percent impaired, limited or restricted       Problem List Patient Active Problem List   Diagnosis Date Noted  . GERD (gastroesophageal reflux disease) 03/11/2016  . COPD exacerbation ([place]) 03/11/2016  . Hyperlipidemia with target LDL less than 100 02/19/2015  . Adjustment disorder with mixed anxiety and depressed mood 03/18/2014  . Hypertension 03/18/2014   Jeancarlos Marchena PT 02/01/2017, 3:47 PM  [facility] [address] [place], [place], [address] Phone: [phone]   Fax:  [phone]  Name: ZEPHYRA GRISMORE MRN: [id number] Date of Birth: 11/24/1951

## 2017-02-01 NOTE — Patient Instructions (Signed)
Pelvic Press  tewstubg   Place hands under belly between navel and pubic bone, palms up. Feel pressure on hands. Increase pressure on hands by pressing pelvis down. This is NOT a pelvic tilt. Hold __5_ seconds. Relax. Repeat _10__ times. Once a day.  KNEE: Flexion - Prone   Hold pelvic press. Bend knee. Raise heel toward buttocks. Repeat on opposite leg. Do not raise hips. _10__ reps per set. When this is mastered, pull both heels up at same time, x 10 reps.  Once a day   Hip Extension (Prone)  Hold pelvic press  Lift left leg _3___ inches from floor, keeping knee locked. Repeat __10__ times per set. Do _1___ sets per session. Do _10___ sessions per day.  HIP: Extension / KNEE: Flexion - Prone    Hold pelvic press. Bend knee, squeeze glutes. Raise leg up  10___ reps per set, _2__ sets per day, _5__ days per week  Axial Extension- Upper body sequence * always start with pelvic press    Lie on stomach with forehead resting on floor and arms at sides. Tuck chin in and raise head and shoulders from floor without bending it up or down. Repeat ___10_ times per set. Do __1__ sets per session. Do _1___ sessions per day.  Progression:  Arms at side Arms in T shape Arms in W shape  Arms in M shape Arms in Y shape    [employer] small of back into mat, maintain pelvic tilt, roll up one vertebrae at a time. Focus on engaging posterior hip muscles. Hold for _5-10 seconds. Repeat _20_-30__ times. 1-2 times per day.  Madelyn Flavors, PT 02/01/17 3:15 PM. [facility] [place], [place], [address] Phone: [phone]   Fax:  [phone]

## 2017-02-04 ENCOUNTER — Ambulatory Visit: Payer: Medicare Other | Admitting: Physical Therapy

## 2017-02-08 ENCOUNTER — Ambulatory Visit (INDEPENDENT_AMBULATORY_CARE_PROVIDER_SITE_OTHER): Payer: Medicare Other | Admitting: Pharmacist

## 2017-02-08 ENCOUNTER — Ambulatory Visit: Payer: Medicare Other | Admitting: Physical Therapy

## 2017-02-08 VITALS — Ht 65.0 in | Wt 153.5 lb

## 2017-02-08 DIAGNOSIS — M8000XG Age-related osteoporosis with current pathological fracture, unspecified site, subsequent encounter for fracture with delayed healing: Secondary | ICD-10-CM | POA: Diagnosis not present

## 2017-02-08 DIAGNOSIS — M6281 Muscle weakness (generalized): Secondary | ICD-10-CM

## 2017-02-08 DIAGNOSIS — M546 Pain in thoracic spine: Secondary | ICD-10-CM | POA: Diagnosis not present

## 2017-02-08 DIAGNOSIS — M810 Age-related osteoporosis without current pathological fracture: Secondary | ICD-10-CM | POA: Diagnosis not present

## 2017-02-08 NOTE — Patient Instructions (Signed)
Calcium & Vitamin D: The Facts  Why is calcium and vitamin D consumption important? Calcium: . Most Americans do not consume adequate amounts of calcium! Calcium is required for proper muscle function, nerve communication, bone support, and many other functions in the body.  . The body uses bones as a source of calcium. Bones 'remodel' themselves continuously - the body constantly breaks bone down to release calcium and rebuilds bones by replacing calcium in the bone later.  . As we get older, the rate of bone breakdown occurs faster than bone rebuilding which could lead to osteopenia, osteoporosis, and possible fractures.   Vitamin D: . People naturally make vitamin D in the body when sunlight hits the skin and triggers a process that leads to vitamin D production. This natural vitamin D production requires about 10-15 minutes of sun exposure on the hands, arms, and face at least 2-3 times per week. However, due to decreased sun exposure and the use of sunscreen, most people will need to get additional vitamin D from foods or supplements. Your doctor can measure your body's vitamin D level through a simple blood test to determine your daily vitamin D needs.  . Vitamin D is used to help the body absorb calcium, maintain bone health, help the immune system, and reduce inflammation. It also plays a role in muscle performance, balance and risk of falling.  . Vitamin D deficiency can lead to osteomalacia or softening of the bones, bone pain, and muscle weakness.   The recommended daily allowance of Calcium and Vitamin D varies for different age groups. Age group Calcium (mg) Vitamin D (IU)  Females and Males: Age 55-50 1000 mg 600 IU  Females: Age 28- 86 1200 mg 600 IU  Males: Age 30-70 1000 mg 600 IU  Females and Males: Age 54+ 1200 mg 800 IU  Pregnant/lactating Females age 82-50 1000 mg 600 IU   How much Calcium do you get in your diet? Calcium Intake # of servings per day  Total calcium (mg)   Skim milk, 2% milk (1 cup) _________ x 300 mg   Yogurt (1 small container) _________ x 200 mg   Cheese (1oz) _________ x 200 mg   Cottage Cheese (1 cup)             ________ x 150 mg   Almond milk (1 cup) _________ x 450 mg   Fortified Orange Juice (1 cup) _________ x 300 mg   Broccoli or spinach ( 1 cup) _________ x 100 mg   Salmon (3 oz) _________ x 150 mg    Almonds (1/4 cup) _______ x 90 mg      How do we get Calcium and Vitamin D in our diet? Calcium: . Obtaining calcium from the diet is the most preferred way to reach the recommended daily goal. If this goal is not reached through diet, calcium supplements are available.  . Calcium is found in many foods including: dairy products, dark leafy vegetables (like broccoli, kale, and spinach), fish, and fortified products like juices and cereals.  . The food label will have a %DV (percent daily value) listed showing the amount of calcium per serving. To determine the total mg per serving, simply replace the % with zero (0).  For example, Almond Breeze almond milk contains 45% DV of calcium or 4107m per 1 cup.  . You can increase the amount of calcium in your diet by using more calcium products in your daily meals. Use yogurt and fruit to  make smoothies or use yogurt to top baked potatoes or make whipped potatoes. Sprinkle low fat cheese onto salads or into egg white omelets. You can even add non-fat dry milk powder (300mg calcium per 1/3 cup) to hot cereals, meat loaf, soups, or potatoes.  . Calcium supplements come in many forms including tablets, chewables, and gummies. Be sure to read the label to determine the correct number of tablets per serving and whether or not to take the supplement with food.  . Calcium carbonate products (Oscal, Caltrate, and Viactiv) are generally better absorbed when taken with food while calcium citrate products like Citracal can be taken with or without food.  . The body can only absorb about 600 mg of calcium  at one time. It is recommended to take calcium supplements in small amounts several times per day.  However, taking it all at once is better than not taking it at all. . Increasing your intake of calcium is essential for bone health, but may also lead to some side effects like constipation, increased gas, bloating or abdominal cramping. To help reduce these side effects, start with 1 tablet per day and slowly increase your intake of the supplement to the recommended doses. It is also recommended that you drink plenty of water each day. Vitamin D: . Very few foods naturally contain vitamin D. However, it is found in saltwater fish (like tuna, salmon and mackerel), beef liver, egg yolks, cheese and vitamin D fortified foods (like yogurt, cereals, orange juice and milk) . The amount of vitamin D in each food or product is listed as %DV on the product label. To determine the total amount of vitamin D per serving, drop the % sign and multiply the number by 4. For example, 1 cup of Almond Breeze almond milk contains 25% DV vitamin D or 100 IU per serving (25 x 4 =100). . Vitamin D is also found in multivitamins and supplements and may be listed as ergocalciferol (vitamin D2) or cholecalciferol (vitamin D3). Each of these forms of vitamin D are equivalent and the daily recommended intake will vary based on your age and the vitamin D levels in your body. Follow your doctor's recommendation for vitamin D intake.                    Exercise for Strong Bones  Exercise is important to build and maintain strong bones / bone density.  There are 2 types of exercises that are important to building and maintaining strong bones:  Weight- bearing and muscle-stregthening.  Weight-bearing Exercises  These exercises include activities that make you move against gravity while staying upright. Weight-bearing exercises can be high-impact or low-impact.  High-impact weight-bearing exercises help build bones and keep them  strong. If you have broken a bone due to osteoporosis or are at risk of breaking a bone, you may need to avoid high-impact exercises. If you're not sure, you should check with your healthcare provider.  Examples of high-impact weight-bearing exercises are: Dancing  Doing high-impact aerobics  Hiking  Jogging/running  Jumping Rope  Stair climbing  Tennis  Low-impact weight-bearing exercises can also help keep bones strong and are a safe alternative if you cannot do high-impact exercises.   Examples of low-impact weight-bearing exercises are: Using elliptical training machines  Doing low-impact aerobics  Using stair-step machines  Fast walking on a treadmill or outside   Muscle-Strengthening Exercises These exercises include activities where you move your body, a weight or some other resistance   against gravity. They are also known as resistance exercises and include: Lifting weights  Using elastic exercise bands  Using weight machines  Lifting your own body weight  Functional movements, such as standing and rising up on your toes  Yoga and Pilates can also improve strength, balance and flexibility. However, certain positions may not be safe for people with osteoporosis or those at increased risk of broken bones. For example, exercises that have you bend forward may increase the chance of breaking a bone in the spine.   Non-Impact Exercises There are other types of exercises that can help prevent falls.  Non-impact exercises can help you to improve balance, posture and how well you move in everyday activities. Some of these exercises include: Balance exercises that strengthen your legs and test your balance, such as Tai Chi, can decrease your risk of falls.  Posture exercises that improve your posture and reduce rounded or "sloping" shoulders can help you decrease the chance of breaking a bone, especially in the spine.  Functional exercises that improve how well you move can help you  with everyday activities and decrease your chance of falling and breaking a bone. For example, if you have trouble getting up from a chair or climbing stairs, you should do these activities as exercises.   **A physical therapist can teach you balance, posture and functional exercises. He/she can also help you learn which exercises are safe and appropriate for you.  [facility] has a physical therapy office in [place] in front of our office and referrals can be made for assessments and treatment as needed and strength and balance training.  If you would like to have an assessment with [facility] and our physical therapy team please let a nurse or provider know.   Fall Prevention in the Home Falls can cause injuries and can affect people from all age groups. There are many simple things that you can do to make your home safe and to help prevent falls. What can I do on the outside of my home?  Regularly repair the edges of walkways and driveways and fix any cracks.  Remove high doorway thresholds.  Trim any shrubbery on the main path into your home.  Use bright outdoor lighting.  Clear walkways of debris and clutter, including tools and rocks.  Regularly check that handrails are securely fastened and in good repair. Both sides of any steps should have handrails.  Install guardrails along the edges of any raised decks or porches.  Have leaves, snow, and ice cleared regularly.  Use sand or salt on walkways during winter months.  In the garage, clean up any spills right away, including grease or oil spills. What can I do in the bathroom?  Use night lights.  Install grab bars by the toilet and in the tub and shower. Do not use towel bars as grab bars.  Use non-skid mats or decals on the floor of the tub or shower.  If you need to sit down while you are in the shower, use a plastic, non-slip stool.  Keep the floor dry. Immediately clean up any water that spills on the floor.  Remove soap  buildup in the tub or shower on a regular basis.  Attach bath mats securely with double-sided non-slip rug tape.  Remove throw rugs and other tripping hazards from the floor. What can I do in the bedroom?  Use night lights.  Make sure that a bedside light is easy to reach.  Do not use  oversized bedding that drapes onto the floor.  Have a firm chair that has side arms to use for getting dressed.  Remove throw rugs and other tripping hazards from the floor. What can I do in the kitchen?  Clean up any spills right away.  Avoid walking on wet floors.  Place frequently used items in easy-to-reach places.  If you need to reach for something above you, use a sturdy step stool that has a grab bar.  Keep electrical cables out of the way.  Do not use floor polish or wax that makes floors slippery. If you have to use wax, make sure that it is non-skid floor wax.  Remove throw rugs and other tripping hazards from the floor. What can I do in the stairways?  Do not leave any items on the stairs.  Make sure that there are handrails on both sides of the stairs. Fix handrails that are broken or loose. Make sure that handrails are as long as the stairways.  Check any carpeting to make sure that it is firmly attached to the stairs. Fix any carpet that is loose or worn.  Avoid having throw rugs at the top or bottom of stairways, or secure the rugs with carpet tape to prevent them from moving.  Make sure that you have a light switch at the top of the stairs and the bottom of the stairs. If you do not have them, have them installed. What are some other fall prevention tips?  Wear closed-toe shoes that fit well and support your feet. Wear shoes that have rubber soles or low heels.  When you use a stepladder, make sure that it is completely opened and that the sides are firmly locked. Have someone hold the ladder while you are using it. Do not climb a closed stepladder.  Add color or contrast  paint or tape to grab bars and handrails in your home. Place contrasting color strips on the first and last steps.  Use mobility aids as needed, such as canes, walkers, scooters, and crutches.  Turn on lights if it is dark. Replace any light bulbs that burn out.  Set up furniture so that there are clear paths. Keep the furniture in the same spot.  Fix any uneven floor surfaces.  Choose a carpet design that does not hide the edge of steps of a stairway.  Be aware of any and all pets.  Review your medicines with your healthcare provider. Some medicines can cause dizziness or changes in blood pressure, which increase your risk of falling. Talk with your health care provider about other ways that you can decrease your risk of falls. This may include working with a physical therapist or trainer to improve your strength, balance, and endurance. This information is not intended to replace advice given to you by your health care provider. Make sure you discuss any questions you have with your health care provider. Document Released: 11/26/2002 Document Revised: 05/04/2016 Document Reviewed: 01/10/2015 Elsevier Interactive Patient Education  2017 Elsevier Inc.  

## 2017-02-08 NOTE — Patient Instructions (Signed)
Knee to Chest (Flexion)    Pull knee toward chest. Feel stretch in lower back or buttock area. Breathing deeply, Hold __30__ seconds. Repeat with other knee. Repeat __3__ times. Do _1-2___ sessions per day.  http://gt2.exer.us/226   Copyright  VHI. All rights reserved.   Piriformis Stretch    Lying on back, pull right knee toward opposite shoulder. Hold __30__ seconds.  Repeat with other leg. Repeat _3___ times. Do _1-2___ sessions per day.  http://gt2.exer.us/258   Copyright  VHI. All rights reserved.   KNEE: Quadriceps - Prone    Place strap around ankle. Bring ankle toward buttocks. Press hip into surface. Hold _30__ seconds.  Repeat with other leg.  __3_ reps per set, _1-2__ sets per day, _7__ days per week   Copyright  VHI. All rights reserved.

## 2017-02-08 NOTE — Progress Notes (Signed)
    HPI: Jody Wilson is here today to review DEXA from 01/31/2017.  This is her second DEXA but first was not performed at our office and previous DEXA is not avaialable for comparison.   Jody Wilson was diagnosed with vertebral fracture 11/19/2016.  She then has vertebraplasty 12/10/2016.   Pain continued and she is seeing Dr Ree Edman and receiving physical therapy.          Med(s) for Osteoporosis/Osteopenia:  None currently Med(s) previously tried for Osteoporosis/Osteopenia:  Tried both oral fosamax and actonel.  Experienced shoulder and chest pain with both                                                             PMH: Age at menopause:  66yo / surgical Hysterectomy?  Yes Oophorectomy?  Yes HRT? Yes - Former.  Type/duration:  patient cannot recall Steroid Use?  No Thyroid med?  No History of cancer?  No History of digestive disorders (ie Crohn's)?  No Current or previous eating disorders?  No Last Vitamin D Result:  Not checked in last year Last GFR Result:  Over 60 (12/10/2016)   FH/SH: Family history of osteoporosis?  No Parent with history of hip fracture?  No Family history of breast cancer?  No Exercise?  Yes just started physical therapy Smoking?  Yes - not ready to stop Alcohol?  No    Calcium Assessment Calcium Intake  # of servings/day  Calcium mg  Milk (8 oz) 0  x  300  = 0  Yogurt (4 oz) 0 x  200 = 0  Cheese (1 oz) 1 x  200 = 200mg   Other Calcium sources   250mg   Ca supplement 0 = 0   Estimated calcium intake per day 450mg     DEXA Results Date of Test T-Score for AP Spine L2-L4 T-Score for Total Right Hip  01/31/2017 -1.6  (not reliable measure due to degenerative changes in spine per radiologist) -2.9                 Assessment: osteoporosis  Recommendations: 1.  Discussed 3 treatment options given her medical history and medication history - Reclast, Forteo or Prolia.  Patient to have labs will then make decision. 2.  recommend calcium  1200mg  daily through supplementation or diet.  3.  recommend weight bearing exercise - 30 minutes at least 4 days per week.   4.  Counseled and educated about fall risk and prevention.  Orders Placed This Encounter  Procedures  . PTH, Intact and Calcium  . VITAMIN D 25 Hydroxy (Vit-D Deficiency, Fractures)  . Phosphorus  . Magnesium  . Calcium  . Specimen status report     Recheck DEXA:  2 years  Time spent counseling patient:  40 minutes

## 2017-02-08 NOTE — Therapy (Signed)
[facility] [place], [place], [address] Phone: [phone]   Fax:  [phone]  Physical Therapy Treatment  Patient Details  Name: Jody Wilson MRN: [id number] Date of Birth: 1951-04-11 Referring Provider: Rennis Harding MD  Encounter Date: 02/08/2017      PT End of Session - 02/08/17 1208    Visit Number 2   Number of Visits 8   Date for PT Re-Evaluation 03/15/17   PT Start Time 1115   PT Stop Time 1212   PT Time Calculation (min) 57 min   Activity Tolerance Patient tolerated treatment well   Behavior During Therapy [facility] for tasks assessed/performed      Past Medical History:  Diagnosis Date  . Depression   . Hypertension     Past Surgical History:  Procedure Laterality Date  . ABDOMINAL HYSTERECTOMY    . CHOLECYSTECTOMY    . FOOT SURGERY     7 in the past  . IR GENERIC HISTORICAL  12/09/2016   IR RADIOLOGIST EVAL & MGMT 12/09/2016 [facility]  . IR GENERIC HISTORICAL  12/10/2016   IR VERTEBROPLASTY CERV/THOR BX INC UNI/BIL INC/INJECT/IMAGING 12/10/2016 [facility]  . TRACHEOSTOMY TUBE PLACEMENT  1982    There were no vitals filed for this visit.      Subjective Assessment - 02/08/17 1118    Subjective back has been sore from doing exercises.  otherwise doing well.   Pertinent History Osteoporosis, COPD, HTN   Patient Stated Goals to get back to work   Currently in Pain? Yes   Pain Score 4    Pain Location Back   Pain Orientation Mid   Pain Descriptors / Indicators Aching;Dull;Sore   Pain Type Acute pain;Chronic pain   Pain Onset More than a month ago   Pain Frequency Constant   Aggravating Factors  prolonged activity   Pain Relieving Factors rest                         [facility] Adult PT Treatment/Exercise - 02/08/17 1121      Lumbar Exercises: Stretches   Single Knee to Chest Stretch 3 reps;30 seconds   Single Knee to Chest Stretch Limitations bil   Quad Stretch 3 reps;30  seconds   Quad Stretch Limitations bil; prone with strap   Piriformis Stretch 3 reps;30 seconds   Piriformis Stretch Limitations bil     Lumbar Exercises: Aerobic   Stationary Bike NuStep L5 x 10 min     Lumbar Exercises: Supine   Bridge 10 reps;5 seconds     Modalities   Modalities Electrical Stimulation;Moist Heat     Moist Heat Therapy   Number Minutes Moist Heat 15 Minutes   Moist Heat Location Other (comment)  thoracic spine     Electrical Stimulation   Electrical Stimulation Location T11/12   Electrical Stimulation Action pre mod   Electrical Stimulation Parameters to tolerance x 15 min   Electrical Stimulation Goals Pain                PT Education - 02/08/17 1207    Education provided Yes   Education Details added stretching to HEP   Person(s) Educated Patient   Methods Explanation;Demonstration;Handout   Comprehension Returned demonstration;Verbalized understanding;Need further instruction             PT Long Term Goals - 02/01/17 1536      PT LONG TERM GOAL #1   Title I with HEP  Time 6   Period Weeks   Status New     PT LONG TERM GOAL #2   Title Patient able to peform ADLS with <= [id number] pain in the back.   Time 6   Period Weeks   Status New     PT LONG TERM GOAL #3   Title Patient able to sleep without waking from pain.   Time 6   Period Weeks   Status New     PT LONG TERM GOAL #4   Title Patient to demo 4+/5 R hip flexion to ease ADLS.   Time 6   Period Weeks   Status New               Plan - 02/08/17 1209    Clinical Impression Statement Pt tolerated stretching exercises well, needing some increased time for instruction.  Will continue to benefit from PT to maximize function.   PT Treatment/Interventions ADLs/Self Care Home Management;Electrical Stimulation;Moist Heat;Therapeutic activities;Neuromuscular re-education;Patient/family education;Manual techniques;Dry needling   PT Next Visit Plan Review HEP; gentle STW  to lumbar/low thoracic paraspinals and R QL. progress prone thoracic ext exercises as tolerated. Core strengthening; modalities for pain prn.   Consulted and Agree with Plan of Care Patient      Patient will benefit from skilled therapeutic intervention in order to improve the following deficits and impairments:  Pain, Postural dysfunction, Impaired flexibility, Decreased strength  Visit Diagnosis: Pain in thoracic spine  Muscle weakness (generalized)     Problem List Patient Active Problem List   Diagnosis Date Noted  . GERD (gastroesophageal reflux disease) 03/11/2016  . COPD exacerbation ([place]) 03/11/2016  . Hyperlipidemia with target LDL less than 100 02/19/2015  . Adjustment disorder with mixed anxiety and depressed mood 03/18/2014  . Hypertension 03/18/2014      Laureen Abrahams, PT, DPT 02/08/17 12:11 PM   [facility] [address] [place], [place], [address] Phone: [phone]   Fax:  [phone]  Name: BAILY KNOPS MRN: [id number] Date of Birth: 1951-05-20

## 2017-02-09 ENCOUNTER — Encounter: Payer: Self-pay | Admitting: Pharmacist

## 2017-02-09 DIAGNOSIS — M810 Age-related osteoporosis without current pathological fracture: Secondary | ICD-10-CM | POA: Insufficient documentation

## 2017-02-09 DIAGNOSIS — Z8781 Personal history of (healed) traumatic fracture: Secondary | ICD-10-CM | POA: Insufficient documentation

## 2017-02-09 LAB — VITAMIN D 25 HYDROXY (VIT D DEFICIENCY, FRACTURES): Vit D, 25-Hydroxy: 18 ng/mL — ABNORMAL LOW (ref 30.0–100.0)

## 2017-02-09 LAB — MAGNESIUM: Magnesium: 1.9 mg/dL (ref 1.6–2.3)

## 2017-02-09 LAB — SPECIMEN STATUS REPORT

## 2017-02-09 LAB — PTH, INTACT AND CALCIUM: PTH: 27 pg/mL (ref 15–65)

## 2017-02-09 LAB — PHOSPHORUS: Phosphorus: 3.6 mg/dL (ref 2.5–4.5)

## 2017-02-09 LAB — CALCIUM: Calcium: 9.7 mg/dL (ref 8.7–10.3)

## 2017-02-11 ENCOUNTER — Ambulatory Visit: Payer: Medicare Other | Admitting: Physical Therapy

## 2017-02-11 DIAGNOSIS — M6281 Muscle weakness (generalized): Secondary | ICD-10-CM

## 2017-02-11 DIAGNOSIS — M546 Pain in thoracic spine: Secondary | ICD-10-CM

## 2017-02-11 NOTE — Therapy (Signed)
[facility] [place], [place], [address] Phone: [phone]   Fax:  [phone]  Physical Therapy Treatment  Patient Details  Name: Jody Wilson MRN: [id number] Date of Birth: 01-14-1951 Referring Provider: Rennis Harding MD  Encounter Date: 02/11/2017      PT End of Session - 02/11/17 0807    Visit Number 3   Number of Visits 8   Date for PT Re-Evaluation 03/15/17   PT Start Time 0730   PT Stop Time 0820   PT Time Calculation (min) 50 min   Activity Tolerance Patient tolerated treatment well   Behavior During Therapy [facility] for tasks assessed/performed      Past Medical History:  Diagnosis Date  . Depression   . Hypertension     Past Surgical History:  Procedure Laterality Date  . ABDOMINAL HYSTERECTOMY    . CHOLECYSTECTOMY    . FOOT SURGERY     7 in the past  . IR GENERIC HISTORICAL  12/09/2016   IR RADIOLOGIST EVAL & MGMT 12/09/2016 [facility]  . IR GENERIC HISTORICAL  12/10/2016   IR VERTEBROPLASTY CERV/THOR BX INC UNI/BIL INC/INJECT/IMAGING 12/10/2016 [facility]  . TRACHEOSTOMY TUBE PLACEMENT  1982    There were no vitals filed for this visit.      Subjective Assessment - 02/11/17 0734    Subjective had a back ache last night, had to take 800mg  Ibuprofen and muscle relaxer.  feels like she's doing more during the day but still having same pain at night (intensity is the same)   Pertinent History Osteoporosis, COPD, HTN   Patient Stated Goals to get back to work   Currently in Pain? Yes   Pain Score 2   last night 5/10   Pain Location Back   Pain Orientation Mid   Pain Descriptors / Indicators Aching;Dull;Sore   Pain Type Acute pain;Chronic pain   Pain Onset More than a month ago   Pain Frequency Constant   Aggravating Factors  prolonged activity   Pain Relieving Factors rest                         [facility] Adult PT Treatment/Exercise - 02/11/17 0739      Lumbar Exercises:  Stretches   Passive Hamstring Stretch 3 reps;30 seconds   Passive Hamstring Stretch Limitations bil, supine with strap   Single Knee to Chest Stretch 3 reps;30 seconds   Single Knee to Chest Stretch Limitations bil   Piriformis Stretch 3 reps;30 seconds   Piriformis Stretch Limitations bil     Lumbar Exercises: Aerobic   Stationary Bike NuStep L6 x 10 min     Lumbar Exercises: Standing   Scapular Retraction Both;20 reps;Theraband   Theraband Level (Scapular Retraction) Level 2 (Red)   Scapular Retraction Limitations 5 sec hold   Shoulder Extension Both;20 reps;Theraband   Theraband Level (Shoulder Extension) Level 2 (Red)   Shoulder Extension Limitations 5 sec hold     Modalities   Modalities Electrical Stimulation;Moist Heat     Moist Heat Therapy   Number Minutes Moist Heat 15 Minutes   Moist Heat Location Other (comment)  thoracic spine     Electrical Stimulation   Electrical Stimulation Location T11/12   Electrical Stimulation Action premod   Electrical Stimulation Parameters to tolerance   Electrical Stimulation Goals Pain                     PT Long  Term Goals - 02/01/17 1536      PT LONG TERM GOAL #1   Title I with HEP   Time 6   Period Weeks   Status New     PT LONG TERM GOAL #2   Title Patient able to peform ADLS with <= [id number] pain in the back.   Time 6   Period Weeks   Status New     PT LONG TERM GOAL #3   Title Patient able to sleep without waking from pain.   Time 6   Period Weeks   Status New     PT LONG TERM GOAL #4   Title Patient to demo 4+/5 R hip flexion to ease ADLS.   Time 6   Period Weeks   Status New               Plan - 02/11/17 [id number]    Clinical Impression Statement Pt continues to tolerate sessions well without increase in pain.  Reports continued pain in evenings but not getting worse and increasing activity at home.  Will continue to benefit from PT to maximize function.   PT Treatment/Interventions  ADLs/Self Care Home Management;Electrical Stimulation;Moist Heat;Therapeutic activities;Neuromuscular re-education;Patient/family education;Manual techniques;Dry needling   PT Next Visit Plan Review HEP; gentle STW to lumbar/low thoracic paraspinals and R QL. progress prone thoracic ext exercises as tolerated. Core strengthening; modalities for pain prn.   Consulted and Agree with Plan of Care Patient      Patient will benefit from skilled therapeutic intervention in order to improve the following deficits and impairments:  Pain, Postural dysfunction, Impaired flexibility, Decreased strength  Visit Diagnosis: Pain in thoracic spine  Muscle weakness (generalized)     Problem List Patient Active Problem List   Diagnosis Date Noted  . Osteoporosis 02/09/2017  . History of vertebral fracture 02/09/2017  . GERD (gastroesophageal reflux disease) 03/11/2016  . COPD exacerbation ([place]) 03/11/2016  . Hyperlipidemia with target LDL less than 100 02/19/2015  . Adjustment disorder with mixed anxiety and depressed mood 03/18/2014  . Hypertension 03/18/2014      Laureen Abrahams, PT, DPT 02/11/17 8:10 AM    [facility] [address] [place], [place], [address] Phone: [phone]   Fax:  [phone]  Name: Jody Wilson MRN: [id number] Date of Birth: 08-27-1951

## 2017-02-14 DIAGNOSIS — S22080A Wedge compression fracture of T11-T12 vertebra, initial encounter for closed fracture: Secondary | ICD-10-CM | POA: Diagnosis not present

## 2017-02-14 DIAGNOSIS — M546 Pain in thoracic spine: Secondary | ICD-10-CM | POA: Diagnosis not present

## 2017-02-15 ENCOUNTER — Other Ambulatory Visit: Payer: Self-pay | Admitting: Pharmacist

## 2017-02-15 ENCOUNTER — Encounter: Payer: Self-pay | Admitting: Physical Therapy

## 2017-02-15 ENCOUNTER — Ambulatory Visit: Payer: Medicare Other | Admitting: Physical Therapy

## 2017-02-15 DIAGNOSIS — M6281 Muscle weakness (generalized): Secondary | ICD-10-CM | POA: Diagnosis not present

## 2017-02-15 DIAGNOSIS — M546 Pain in thoracic spine: Secondary | ICD-10-CM | POA: Diagnosis not present

## 2017-02-15 MED ORDER — TERIPARATIDE (RECOMBINANT) 600 MCG/2.4ML ~~LOC~~ SOLN: 20.0000 ug | Freq: Every day | SUBCUTANEOUS | 2 refills | Status: DC

## 2017-02-15 MED ORDER — VITAMIN D (ERGOCALCIFEROL) 1.25 MG (50000 UNIT) PO CAPS: 50000.0000 [IU] | ORAL_CAPSULE | ORAL | 0 refills | Status: DC

## 2017-02-15 NOTE — Patient Instructions (Signed)
   Hold each stretch 30 seconds perform 3 times on each side alternating sides for comfort.

## 2017-02-15 NOTE — Therapy (Signed)
[facility] [place], [place], [address] Phone: [phone]   Fax:  [phone]  Physical Therapy Treatment  Patient Details  Name: [name] MRN: [id number] Date of Birth: [birth year] Referring Provider: Rennis Harding MD  Encounter Date: 02/15/2017      PT End of Session - 02/15/17 1240    Visit Number 4   Number of Visits 8   Date for PT Re-Evaluation 03/15/17   PT Start Time 1230   PT Stop Time 1300   PT Time Calculation (min) 30 min   Activity Tolerance Patient tolerated treatment well   Behavior During Therapy [facility] for tasks assessed/performed      Past Medical History:  Diagnosis Date  . Depression   . Hypertension     Past Surgical History:  Procedure Laterality Date  . ABDOMINAL HYSTERECTOMY    . CHOLECYSTECTOMY    . FOOT SURGERY     7 in the past  . IR GENERIC HISTORICAL  12/09/2016   IR RADIOLOGIST EVAL & MGMT 12/09/2016 [facility]  . IR GENERIC HISTORICAL  12/10/2016   IR VERTEBROPLASTY CERV/THOR BX INC UNI/BIL INC/INJECT/IMAGING 12/10/2016 [facility]  . TRACHEOSTOMY TUBE PLACEMENT  1982    There were no vitals filed for this visit.      Subjective Assessment - 02/15/17 1234    Subjective Pt reporting she has been taking care of her husband with gout. Since her husband's illness she has been doing all the grocery shopping and now her back pain is 8/10.    Pertinent History Osteoporosis, COPD, HTN   How long can you sit comfortably? 1.5 hours   How long can you walk comfortably? 20 min   Diagnostic tests xrays   Patient Stated Goals to get back to work   Currently in Pain? Yes   Pain Score 8    Pain Location Back   Pain Orientation Mid;Lower   Pain Descriptors / Indicators Aching   Pain Type Acute pain   Pain Onset More than a month ago   Pain Frequency Constant   Aggravating Factors  prolonged activity   Pain Relieving Factors rest, heat pad   Effect of Pain on Daily Activities  Difficutly with household chores, ADL's and difficutly sleeping at night                          [facility] Adult PT Treatment/Exercise - 02/15/17 0001      Lumbar Exercises: Stretches   Passive Hamstring Stretch 3 reps;30 seconds   Passive Hamstring Stretch Limitations bil, supine with strap   Single Knee to Chest Stretch 3 reps;30 seconds   Single Knee to Chest Stretch Limitations bilateral   Lower Trunk Rotation 3 reps;20 seconds   Piriformis Stretch 3 reps;30 seconds   Piriformis Stretch Limitations bilateral     Lumbar Exercises: Aerobic   Stationary Bike Nustep L5 x 10 minutes     Lumbar Exercises: Supine   Bridge 10 reps;5 seconds     Modalities   Modalities Electrical Stimulation     Moist Heat Therapy   Number Minutes Moist Heat 15 Minutes   Moist Heat Location Lumbar Spine;Other (comment)     Emergency planning/management officer Action premod   Electrical Stimulation Parameters to tolerance   Electrical Stimulation Goals Pain                PT Education -  02/15/17 1238    Education provided Yes   Education Details Sleeping positions and posture   Person(s) Educated Patient   Methods Explanation   Comprehension Verbalized understanding;Returned demonstration             PT Long Term Goals - 02/15/17 1417      PT LONG TERM GOAL #1   Title I with HEP   Time 6   Period Weeks   Status On-going     PT LONG TERM GOAL #2   Title Patient able to peform ADLS with <= [id number] pain in the back.   Time 6   Period Weeks   Status New     PT LONG TERM GOAL #3   Title Patient able to sleep without waking from pain.   Time 6   Period Weeks   Status New     PT LONG TERM GOAL #4   Title Patient to demo 4+/5 R hip flexion to ease ADLS.   Time 6   Period Weeks   Status New               Plan - 02/15/17 1413    Clinical Impression Statement Pt continues to toleate session  well without increasing pain during session. We discussed proper positioning during sleep to prevent trunk rotation using pillows for support. Also discussed posture correction and reviewed gentle stretching and core strengthening. Skilled PT needed to address pt's below impairments and progress functional mobility.    Rehab Potential Excellent   PT Frequency 2x / week   PT Duration 6 weeks   PT Next Visit Plan Print copy of piriformis stretch for pt and review, progress prone thoracic ext exercises as tolerated,  Core strengthening; modalities for pain prn.   PT Home Exercise Plan pelvic press series including cerv retraction; bridge piriformis stretch   Consulted and Agree with Plan of Care Patient      Patient will benefit from skilled therapeutic intervention in order to improve the following deficits and impairments:  Pain, Postural dysfunction, Impaired flexibility, Decreased strength, Decreased activity tolerance, Difficulty walking, Impaired perceived functional ability  Visit Diagnosis: Pain in thoracic spine  Muscle weakness (generalized)     Problem List Patient Active Problem List   Diagnosis Date Noted  . Osteoporosis 02/09/2017  . History of vertebral fracture 02/09/2017  . GERD (gastroesophageal reflux disease) 03/11/2016  . COPD exacerbation ([place]) 03/11/2016  . Hyperlipidemia with target LDL less than 100 02/19/2015  . Adjustment disorder with mixed anxiety and depressed mood 03/18/2014  . Hypertension 03/18/2014    Oretha Caprice, MPT 02/15/2017, 2:24 PM  [facility] [place], [place], [address] Phone: [phone]   Fax:  [phone]  Name: Jody Wilson MRN: [id number] Date of Birth: 1951/11/18

## 2017-02-17 ENCOUNTER — Ambulatory Visit (INDEPENDENT_AMBULATORY_CARE_PROVIDER_SITE_OTHER): Payer: Medicare Other | Admitting: Pharmacist

## 2017-02-17 ENCOUNTER — Encounter: Payer: Self-pay | Admitting: Physical Therapy

## 2017-02-17 ENCOUNTER — Ambulatory Visit: Payer: Medicare Other | Attending: Orthopaedic Surgery | Admitting: Physical Therapy

## 2017-02-17 DIAGNOSIS — M6281 Muscle weakness (generalized): Secondary | ICD-10-CM | POA: Diagnosis not present

## 2017-02-17 DIAGNOSIS — M8000XG Age-related osteoporosis with current pathological fracture, unspecified site, subsequent encounter for fracture with delayed healing: Secondary | ICD-10-CM | POA: Diagnosis not present

## 2017-02-17 DIAGNOSIS — M546 Pain in thoracic spine: Secondary | ICD-10-CM

## 2017-02-17 DIAGNOSIS — M4850XS Collapsed vertebra, not elsewhere classified, site unspecified, sequela of fracture: Secondary | ICD-10-CM | POA: Diagnosis not present

## 2017-02-17 DIAGNOSIS — IMO0001 Reserved for inherently not codable concepts without codable children: Secondary | ICD-10-CM

## 2017-02-17 MED ORDER — PEN NEEDLES 32G X 4 MM MISC: 1.0000 | Freq: Every day | 1 refills | Status: DC

## 2017-02-17 NOTE — Progress Notes (Signed)
Patient ID: Jody Wilson, female   DOB: 09-25-1951, 66 y.o.   MRN: [id number]     HPI: Jody Wilson is here today for education on how to administer Forteo.  She did check with her pharmacy and cost of Forteo was $1000/month.   Patient has history os vertebral fracture and vertebraplasty         Med(s) for Osteoporosis/Osteopenia:  None currently Med(s) previously tried for Osteoporosis/Osteopenia:  Tried both oral fosamax and actonel.  Experienced shoulder and chest pain with both                                                              Last Vitamin D Result: 18.0 (02/20/218) - started vitamin D supplement this week  Last GFR Result:  Over 60 (12/10/2016) PTH was WNL 02/08/2017 Calcium and magnesium and phosphorus were also all WNL    DEXA Results Date of Test T-Score for AP Spine L2-L4 T-Score for Total Right Hip  01/31/2017 -1.6  (not reliable measure due to degenerative changes in spine per radiologist) -2.9                 Assessment: osteoporosis  Recommendations: 1.  Start Forteo - 53mcg SQ daily.  Patient is taught how to inject, site selections and proper storage.       Patient is given #3 months of samples.  She was also given a discount card that can be used for up to $9000.  This should allow for patient to use Forteo for at least 1 year.  We might be able to repeat samples and use card again for second year. 2.  continue calcium 1200mg  daily through supplementation or diet. Also continue weekly vitamin D - will recheck level in 3 months    Recheck DEXA:  1 year  Time spent counseling patient:  20 minutes

## 2017-02-17 NOTE — Therapy (Signed)
[facility] [place], [place], [address] Phone: [phone]   Fax:  [phone]  Physical Therapy Treatment  Patient Details  Name: Jody Wilson MRN: [id number] Date of Birth: 06-05-1951 Referring Provider: Rennis Harding MD  Encounter Date: 02/17/2017      PT End of Session - 02/17/17 0810    Visit Number 5   Number of Visits 12  12 per MD order   Date for PT Re-Evaluation 03/15/17   PT Start Time 0733   PT Stop Time 0828   PT Time Calculation (min) 55 min   Activity Tolerance Patient tolerated treatment well   Behavior During Therapy [facility] for tasks assessed/performed      Past Medical History:  Diagnosis Date  . Depression   . Hypertension     Past Surgical History:  Procedure Laterality Date  . ABDOMINAL HYSTERECTOMY    . CHOLECYSTECTOMY    . FOOT SURGERY     7 in the past  . IR GENERIC HISTORICAL  12/09/2016   IR RADIOLOGIST EVAL & MGMT 12/09/2016 [facility]  . IR GENERIC HISTORICAL  12/10/2016   IR VERTEBROPLASTY CERV/THOR BX INC UNI/BIL INC/INJECT/IMAGING 12/10/2016 [facility]  . TRACHEOSTOMY TUBE PLACEMENT  1982    There were no vitals filed for this visit.      Subjective Assessment - 02/17/17 0736    Subjective Patient reported doing better overall and has really improved after last that therapist recommended sleeping with pillows between knees for comfort.    Pertinent History Osteoporosis, COPD, HTN   How long can you sit comfortably? 1.5 hours   How long can you walk comfortably? 20 min   Diagnostic tests xrays   Patient Stated Goals to get back to work   Currently in Pain? Yes   Pain Score 3    Pain Location Back   Pain Orientation Mid;Lower   Pain Descriptors / Indicators Aching   Pain Type Acute pain   Pain Onset More than a month ago   Pain Frequency Constant   Aggravating Factors  prolong activity   Pain Relieving Factors at rest and heat                          [facility] Adult PT Treatment/Exercise - 02/17/17 0001      Self-Care   Self-Care Lifting;ADL's;Posture   ADL's all techniques with home and work   Lifting correct technique   Posture all positions     Exercises   Exercises Knee/Hip;Lumbar     Lumbar Exercises: Aerobic   Stationary Bike Nustep L5 x 10 minutes     Lumbar Exercises: Standing   Scapular Retraction Both;20 reps;Theraband   Theraband Level (Scapular Retraction) Other (comment)  pink XTS   Shoulder Extension Both;20 reps;Theraband   Theraband Level (Shoulder Extension) Other (comment)  pink XTS   Other Standing Lumbar Exercises wall push ups 2x10     Lumbar Exercises: Supine   Bridge Other (comment)  2x10 with green t-band for hip abd hold   Straight Leg Raise 10 reps;3 seconds  draw in holds     Knee/Hip Exercises: Standing   Lateral Step Up 2 sets;10 reps;Step Height: 6"   Forward Step Up 2 sets;Both;10 reps;Step Height: 6"   Rocker Board 3 minutes     Moist Heat Therapy   Number Minutes Moist Heat 15 Minutes   Moist Heat Location Lumbar Spine;Other (comment)  thoracic  Product manager Parameters 80-150hz  x28min   Electrical Stimulation Goals Pain                     PT Long Term Goals - 02/17/17 0811      PT LONG TERM GOAL #1   Title I with HEP   Time 6   Period Weeks   Status On-going     PT LONG TERM GOAL #2   Title Patient able to peform ADLS with <= [id number] pain in the back.   Time 6   Period Weeks   Status On-going     PT LONG TERM GOAL #3   Title Patient able to sleep without waking from pain.   Time 6   Period Weeks   Status On-going     PT LONG TERM GOAL #4   Title Patient to demo 4+/5 R hip flexion to ease ADLS.   Time 6   Period Weeks   Status On-going               Plan - 02/17/17 [id number]    Clinical  Impression Statement Patient tolerated treatment very well today and has reported less pain overall. Educated patient on posture awareness techniques in all positions and with work/home activities to protect back and prevent injury also educated patient on osteoporosis and posture and ADL's /overall protection and techniques to avoid further injury. Patient able to progress overall strengthening and core activation exercises with good technique. Patient current goals ongoing due to pain deficts.    Rehab Potential Excellent   PT Frequency 2x / week   PT Duration 6 weeks   PT Treatment/Interventions ADLs/Self Care Home Management;Electrical Stimulation;Moist Heat;Therapeutic activities;Neuromuscular re-education;Patient/family education;Manual techniques;Dry needling   PT Next Visit Plan cont with POC per MPT to progress prone thoracic ext exercises as tolerated,  Core strengthening; modalities for pain prn.   Consulted and Agree with Plan of Care Patient      Patient will benefit from skilled therapeutic intervention in order to improve the following deficits and impairments:  Pain, Postural dysfunction, Impaired flexibility, Decreased strength, Decreased activity tolerance, Difficulty walking, Impaired perceived functional ability  Visit Diagnosis: Pain in thoracic spine  Muscle weakness (generalized)     Problem List Patient Active Problem List   Diagnosis Date Noted  . Osteoporosis 02/09/2017  . History of vertebral fracture 02/09/2017  . GERD (gastroesophageal reflux disease) 03/11/2016  . COPD exacerbation ([place]) 03/11/2016  . Hyperlipidemia with target LDL less than 100 02/19/2015  . Adjustment disorder with mixed anxiety and depressed mood 03/18/2014  . Hypertension 03/18/2014    Phillips Climes, PTA 02/17/2017, 8:32 AM  [facility] [place], [place], [address] Phone: [phone]   Fax:  [phone]  Name:  Jody Wilson MRN: [id number] Date of Birth: December 31, 1950

## 2017-02-22 ENCOUNTER — Ambulatory Visit: Payer: Medicare Other | Admitting: *Deleted

## 2017-02-22 DIAGNOSIS — M546 Pain in thoracic spine: Secondary | ICD-10-CM

## 2017-02-22 DIAGNOSIS — M6281 Muscle weakness (generalized): Secondary | ICD-10-CM | POA: Diagnosis not present

## 2017-02-22 NOTE — Therapy (Signed)
[facility] [place], [place], [address] Phone: [phone]   Fax:  [phone]  Physical Therapy Treatment  Patient Details  Name: Jody Wilson MRN: [id number] Date of Birth: 1951/05/03 Referring Provider: Rennis Harding MD  Encounter Date: 02/22/2017      PT End of Session - 02/22/17 0823    Visit Number 6   Number of Visits 12   Date for PT Re-Evaluation 03/15/17   PT Start Time 0815   PT Stop Time 0904   PT Time Calculation (min) 49 min      Past Medical History:  Diagnosis Date  . Depression   . Hypertension     Past Surgical History:  Procedure Laterality Date  . ABDOMINAL HYSTERECTOMY    . CHOLECYSTECTOMY    . FOOT SURGERY     7 in the past  . IR GENERIC HISTORICAL  12/09/2016   IR RADIOLOGIST EVAL & MGMT 12/09/2016 [facility]  . IR GENERIC HISTORICAL  12/10/2016   IR VERTEBROPLASTY CERV/THOR BX INC UNI/BIL INC/INJECT/IMAGING 12/10/2016 [facility]  . TRACHEOSTOMY TUBE PLACEMENT  1982    There were no vitals filed for this visit.      Subjective Assessment - 02/22/17 0817    Subjective Patient reported doing better overall and has really improved after last that therapist recommended sleeping with pillows between knees for comfort.  {Pillows are helping   Pertinent History Osteoporosis, COPD, HTN   How long can you sit comfortably? 1.5 hours   How long can you walk comfortably? 20 min   Diagnostic tests xrays   Patient Stated Goals to get back to work   Currently in Pain? Yes   Pain Score 1    Pain Location Back   Pain Orientation Mid;Lower   Pain Descriptors / Indicators Aching   Pain Type Acute pain   Pain Onset More than a month ago                         [facility] Adult PT Treatment/Exercise - 02/22/17 0001      Exercises   Exercises Knee/Hip;Lumbar     Lumbar Exercises: Aerobic   UBE (Upper Arm Bike) 120 RPMs x 5 mins     Lumbar Exercises: Standing   Scapular  Retraction Both;20 reps;Theraband   Theraband Level (Scapular Retraction) Other (comment)  pink XTS   Shoulder Extension Both;20 reps;Theraband   Theraband Level (Shoulder Extension) Other (comment)  pink XTS   Other Standing Lumbar Exercises wall push ups 2x10     Lumbar Exercises: Supine   Bridge Other (comment)  2x10 with green t-band for hip abd hold   Straight Leg Raise 10 reps;3 seconds  draw in holds     Knee/Hip Exercises: Standing   Lateral Step Up 2 sets;10 reps;Step Height: 6"   Forward Step Up 2 sets;Both;10 reps;Step Height: 6"   Rocker Board 3 minutes     Modalities   Modalities Electrical Stimulation     Moist Heat Therapy   Number Minutes Moist Heat 15 Minutes   Moist Heat Location Lumbar Spine;Other (comment)     Electrical Stimulation   Electrical Stimulation Location T11/12 premod 80-150 hz x 15 mins   Electrical Stimulation Goals Pain                     PT Long Term Goals - 02/17/17 0811      PT LONG TERM GOAL #1  Title I with HEP   Time 6   Period Weeks   Status On-going     PT LONG TERM GOAL #2   Title Patient able to peform ADLS with <= [id number] pain in the back.   Time 6   Period Weeks   Status On-going     PT LONG TERM GOAL #3   Title Patient able to sleep without waking from pain.   Time 6   Period Weeks   Status On-going     PT LONG TERM GOAL #4   Title Patient to demo 4+/5 R hip flexion to ease ADLS.   Time 6   Period Weeks   Status On-going               Plan - 02/22/17 [id number]    Clinical Impression Statement Pt arrived to clinic today doing better with less pain in mid- back. She was able to complete all therex today with minimal discomfort, but prone Exs were put on hold due to increased back pain. Pt feels that her posture corrections have really helped with ADLs   Rehab Potential Excellent   PT Frequency 2x / week   PT Duration 6 weeks   PT Treatment/Interventions ADLs/Self Care Home  Management;Electrical Stimulation;Moist Heat;Therapeutic activities;Neuromuscular re-education;Patient/family education;Manual techniques;Dry needling   PT Next Visit Plan cont with POC per MPT to progress prone thoracic ext exercises as tolerated,  Core strengthening; modalities for pain prn.   PT Home Exercise Plan pelvic press series including cerv retraction; bridge piriformis stretch   Consulted and Agree with Plan of Care Patient      Patient will benefit from skilled therapeutic intervention in order to improve the following deficits and impairments:  Pain, Postural dysfunction, Impaired flexibility, Decreased strength, Decreased activity tolerance, Difficulty walking, Impaired perceived functional ability  Visit Diagnosis: Pain in thoracic spine  Muscle weakness (generalized)     Problem List Patient Active Problem List   Diagnosis Date Noted  . Osteoporosis 02/09/2017  . History of vertebral fracture 02/09/2017  . GERD (gastroesophageal reflux disease) 03/11/2016  . COPD exacerbation ([place]) 03/11/2016  . Hyperlipidemia with target LDL less than 100 02/19/2015  . Adjustment disorder with mixed anxiety and depressed mood 03/18/2014  . Hypertension 03/18/2014    Jody Wilson,CHRIS, PTA 02/22/2017, 9:12 AM  [facility] [place], [place], [address] Phone: [phone]   Fax:  [phone]  Name: Jody Wilson MRN: [id number] Date of Birth: 06/05/1951

## 2017-02-24 ENCOUNTER — Encounter: Payer: Self-pay | Admitting: Physical Therapy

## 2017-02-24 ENCOUNTER — Ambulatory Visit: Payer: Medicare Other | Admitting: Physical Therapy

## 2017-02-24 DIAGNOSIS — M546 Pain in thoracic spine: Secondary | ICD-10-CM | POA: Diagnosis not present

## 2017-02-24 DIAGNOSIS — M6281 Muscle weakness (generalized): Secondary | ICD-10-CM | POA: Diagnosis not present

## 2017-02-24 NOTE — Therapy (Signed)
[facility] [place], [place], [address] Phone: [phone]   Fax:  [phone]  Physical Therapy Treatment  Patient Details  Name: Jody Wilson MRN: [id number] Date of Birth: [birth year] Referring Provider: Rennis Harding MD  Encounter Date: 02/24/2017      PT End of Session - 02/24/17 1255    Visit Number 7   Number of Visits 12   Date for PT Re-Evaluation 03/15/17   PT Start Time 1229   PT Stop Time 1329   PT Time Calculation (min) 60 min   Activity Tolerance Patient tolerated treatment well   Behavior During Therapy [facility] for tasks assessed/performed      Past Medical History:  Diagnosis Date  . Depression   . Hypertension     Past Surgical History:  Procedure Laterality Date  . ABDOMINAL HYSTERECTOMY    . CHOLECYSTECTOMY    . FOOT SURGERY     7 in the past  . IR GENERIC HISTORICAL  12/09/2016   IR RADIOLOGIST EVAL & MGMT 12/09/2016 [facility]  . IR GENERIC HISTORICAL  12/10/2016   IR VERTEBROPLASTY CERV/THOR BX INC UNI/BIL INC/INJECT/IMAGING 12/10/2016 [facility]  . TRACHEOSTOMY TUBE PLACEMENT  1982    There were no vitals filed for this visit.      Subjective Assessment - 02/24/17 1248    Subjective Patient feels 85%-95% better   Pertinent History Osteoporosis, COPD, HTN   How long can you sit comfortably? 1.5 hours   How long can you walk comfortably? 20 min   Diagnostic tests xrays   Patient Stated Goals to get back to work   Currently in Pain? Yes   Pain Score 1    Pain Orientation Mid;Lower   Pain Descriptors / Indicators Aching   Pain Type Acute pain   Pain Onset More than a month ago   Pain Frequency Constant   Aggravating Factors  prolong activity   Pain Relieving Factors at rest                         [facility] Adult PT Treatment/Exercise - 02/24/17 0001      Lumbar Exercises: Aerobic   Stationary Bike Nustep L5 x 10 minutes   UBE (Upper Arm Bike) 120 RPMs x 5  mins     Lumbar Exercises: Standing   Scapular Retraction Both;20 reps;Theraband   Theraband Level (Scapular Retraction) Other (comment)  pink XTS   Shoulder Extension Both;20 reps;Theraband   Theraband Level (Shoulder Extension) Other (comment)  pink XTS   Other Standing Lumbar Exercises 3# hip flexion with knee bend /ext x10 each     Lumbar Exercises: Supine   Bridge 20 reps  with green t-band for hip abd     Knee/Hip Exercises: Standing   Lateral Step Up 10 reps;Step Height: 6";3 sets   Forward Step Up Both;10 reps;Step Height: 6";3 sets     Moist Heat Therapy   Number Minutes Moist Heat 15 Minutes   Moist Heat Location Lumbar Spine;Other (comment)     Electrical Stimulation   Electrical Stimulation Location T11/12 premod 80-150 hz x 15 mins   Electrical Stimulation Goals Pain                     PT Long Term Goals - 02/24/17 1257      PT LONG TERM GOAL #1   Title I with HEP   Time 6   Period Weeks  Status Achieved     PT LONG TERM GOAL #2   Title Patient able to peform ADLS with <= 2-7/61 pain in the back.   Time 6   Period Weeks   Status Achieved  1/10 pain with ADL's 02/24/17     PT LONG TERM GOAL #3   Title Patient able to sleep without waking from pain.   Time 6   Period Weeks   Status Achieved     PT LONG TERM GOAL #4   Title Patient to demo 4+/5 R hip flexion to ease ADLS.   Time 6   Period Weeks   Status On-going               Plan - 02/24/17 1319    Clinical Impression Statement Patient arrived today and has reported feeling little pain and much improvement overall. Patient is able to sleep undisturbed and is able to perform ADL's with no more than 1/10 pain. Patient is independent with HEP's and knows when to rest and good posture technique. Patient has met LTG's #1, #2 and #3 today, only goal remaining due to slight strength defict in Right LE.   Rehab Potential Excellent   PT Frequency 2x / week   PT Duration 6 weeks    PT Treatment/Interventions ADLs/Self Care Home Management;Electrical Stimulation;Moist Heat;Therapeutic activities;Neuromuscular re-education;Patient/family education;Manual techniques;Dry needling   PT Next Visit Plan cont with POC per MPT to progress prone thoracic ext exercises as tolerated,  Core strengthening; modalities for pain prn.   Consulted and Agree with Plan of Care Patient      Patient will benefit from skilled therapeutic intervention in order to improve the following deficits and impairments:  Pain, Postural dysfunction, Impaired flexibility, Decreased strength, Decreased activity tolerance, Difficulty walking, Impaired perceived functional ability  Visit Diagnosis: Pain in thoracic spine  Muscle weakness (generalized)     Problem List Patient Active Problem List   Diagnosis Date Noted  . Osteoporosis 02/09/2017  . History of vertebral fracture 02/09/2017  . GERD (gastroesophageal reflux disease) 03/11/2016  . COPD exacerbation ([place]) 03/11/2016  . Hyperlipidemia with target LDL less than 100 02/19/2015  . Adjustment disorder with mixed anxiety and depressed mood 03/18/2014  . Hypertension 03/18/2014    Jody Wilson, PTA 02/24/2017, 1:30 PM  [facility] [address] [place], [place], [address] Phone: [phone]   Fax:  [phone]  Name: Jody Wilson MRN: [id number] Date of Birth: 1951/03/15

## 2017-02-28 ENCOUNTER — Encounter: Payer: Medicare Other | Admitting: Physical Therapy

## 2017-03-02 ENCOUNTER — Encounter: Payer: Medicare Other | Admitting: Physical Therapy

## 2017-03-02 ENCOUNTER — Other Ambulatory Visit: Payer: Self-pay | Admitting: Family Medicine

## 2017-03-04 ENCOUNTER — Ambulatory Visit: Payer: Medicare Other | Admitting: *Deleted

## 2017-03-04 DIAGNOSIS — M6281 Muscle weakness (generalized): Secondary | ICD-10-CM

## 2017-03-04 DIAGNOSIS — M546 Pain in thoracic spine: Secondary | ICD-10-CM | POA: Diagnosis not present

## 2017-03-04 NOTE — Therapy (Signed)
[facility] [place], [place], [address] Phone: [phone]   Fax:  [phone]  Physical Therapy Treatment  Patient Details  Name: Jody Wilson MRN: [id number] Date of Birth: 10/08/1951 Referring Provider: Rennis Harding MD  Encounter Date: 03/04/2017      PT End of Session - 03/04/17 0821    Visit Number 8   Number of Visits 12   Date for PT Re-Evaluation 03/15/17   PT Start Time 0815   PT Stop Time 0907   PT Time Calculation (min) 52 min      Past Medical History:  Diagnosis Date  . Depression   . Hypertension     Past Surgical History:  Procedure Laterality Date  . ABDOMINAL HYSTERECTOMY    . CHOLECYSTECTOMY    . FOOT SURGERY     7 in the past  . IR GENERIC HISTORICAL  12/09/2016   IR RADIOLOGIST EVAL & MGMT 12/09/2016 [facility]  . IR GENERIC HISTORICAL  12/10/2016   IR VERTEBROPLASTY CERV/THOR BX INC UNI/BIL INC/INJECT/IMAGING 12/10/2016 [facility]  . TRACHEOSTOMY TUBE PLACEMENT  1982    There were no vitals filed for this visit.      Subjective Assessment - 03/04/17 0826    Subjective Patient feels 85%-95% better   Pertinent History Osteoporosis, COPD, HTN   How long can you sit comfortably? 1.5 hours   How long can you walk comfortably? 20 min   Diagnostic tests xrays   Patient Stated Goals to get back to work   Currently in Pain? Yes   Pain Location Back   Pain Orientation Mid;Lower   Pain Descriptors / Indicators Aching   Pain Type Acute pain   Pain Onset More than a month ago   Pain Frequency Constant                         [facility] Adult PT Treatment/Exercise - 03/04/17 0001      Lumbar Exercises: Aerobic   Stationary Bike Nustep L5 x 10 minutes   UBE (Upper Arm Bike) 120 RPMs x 5 mins     Lumbar Exercises: Standing   Scapular Retraction Both;20 reps;Theraband   Theraband Level (Scapular Retraction) Other (comment)  pink XTS   Shoulder Extension Both;20  reps;Theraband   Theraband Level (Shoulder Extension) Other (comment)  pink XTS     Knee/Hip Exercises: Standing   Hip Abduction Stengthening;Both;3 sets;10 reps   Lateral Step Up 10 reps;Step Height: 6";3 sets   Forward Step Up Both;10 reps;Step Height: 6";3 sets   Step Down 2 sets;10 reps     Modalities   Modalities Electrical Stimulation     Moist Heat Therapy   Number Minutes Moist Heat 15 Minutes   Moist Heat Location Lumbar Spine;Other (comment)     Electrical Stimulation   Electrical Stimulation Location T11/12 premod 80-150 hz x 15 mins   Electrical Stimulation Goals Pain                     PT Long Term Goals - 02/24/17 1257      PT LONG TERM GOAL #1   Title I with HEP   Time 6   Period Weeks   Status Achieved     PT LONG TERM GOAL #2   Title Patient able to peform ADLS with <= 1-7/40 pain in the back.   Time 6   Period Weeks   Status Achieved  1/10 pain with  ADL's 02/24/17     PT LONG TERM GOAL #3   Title Patient able to sleep without waking from pain.   Time 6   Period Weeks   Status Achieved     PT LONG TERM GOAL #4   Title Patient to demo 4+/5 R hip flexion to ease ADLS.   Time 6   Period Weeks   Status On-going               Plan - 03/04/17 7331    Clinical Impression Statement Pt arrived today feeling better still with low pain levels and reports ADLs are getting easier and she doesn't have to lay on the heating pad after performing house work.  She has met LTG's 1,2, and 3. LTG for strength is ongoing due to RT LE strength deficit   Rehab Potential Excellent   PT Frequency 2x / week   PT Duration 6 weeks   PT Treatment/Interventions ADLs/Self Care Home Management;Electrical Stimulation;Moist Heat;Therapeutic activities;Neuromuscular re-education;Patient/family education;Manual techniques;Dry needling   PT Next Visit Plan cont with POC per MPT to progress prone thoracic ext exercises as tolerated,  Core strengthening;  modalities for pain prn.   PT Home Exercise Plan pelvic press series including cerv retraction; bridge piriformis stretch   Consulted and Agree with Plan of Care Patient      Patient will benefit from skilled therapeutic intervention in order to improve the following deficits and impairments:  Pain, Postural dysfunction, Impaired flexibility, Decreased strength, Decreased activity tolerance, Difficulty walking, Impaired perceived functional ability  Visit Diagnosis: Pain in thoracic spine  Muscle weakness (generalized)     Problem List Patient Active Problem List   Diagnosis Date Noted  . Osteoporosis 02/09/2017  . History of vertebral fracture 02/09/2017  . GERD (gastroesophageal reflux disease) 03/11/2016  . COPD exacerbation ([place]) 03/11/2016  . Hyperlipidemia with target LDL less than 100 02/19/2015  . Adjustment disorder with mixed anxiety and depressed mood 03/18/2014  . Hypertension 03/18/2014    Braxton Weisbecker,CHRIS, PTA 03/04/2017, 9:12 AM  [facility] [place], [place], [address] Phone: [phone]   Fax:  [phone]  Name: ALANIS CLIFT MRN: [id number] Date of Birth: 03/20/1951

## 2017-03-08 ENCOUNTER — Ambulatory Visit: Payer: Medicare Other | Admitting: *Deleted

## 2017-03-08 DIAGNOSIS — M546 Pain in thoracic spine: Secondary | ICD-10-CM

## 2017-03-08 DIAGNOSIS — M6281 Muscle weakness (generalized): Secondary | ICD-10-CM

## 2017-03-08 NOTE — Therapy (Signed)
[facility] [place], [place], [address] Phone: [phone]   Fax:  [phone]  Physical Therapy Treatment  Patient Details  Name: Jody Wilson MRN: [id number] Date of Birth: [birth year] Referring Provider: Rennis Harding MD  Encounter Date: 03/08/2017      PT End of Session - 03/08/17 0841    Visit Number 9   Number of Visits 12   Date for PT Re-Evaluation 03/15/17   PT Start Time 0815   PT Stop Time 0906   PT Time Calculation (min) 51 min      Past Medical History:  Diagnosis Date  . Depression   . Hypertension     Past Surgical History:  Procedure Laterality Date  . ABDOMINAL HYSTERECTOMY    . CHOLECYSTECTOMY    . FOOT SURGERY     7 in the past  . IR GENERIC HISTORICAL  12/09/2016   IR RADIOLOGIST EVAL & MGMT 12/09/2016 [facility]  . IR GENERIC HISTORICAL  12/10/2016   IR VERTEBROPLASTY CERV/THOR BX INC UNI/BIL INC/INJECT/IMAGING 12/10/2016 [facility]  . TRACHEOSTOMY TUBE PLACEMENT  1982    There were no vitals filed for this visit.      Subjective Assessment - 03/08/17 0829    Subjective Had a flare-up over the weekend from doing housework   Pertinent History Osteoporosis, COPD, HTN   How long can you sit comfortably? 1.5 hours   How long can you walk comfortably? 20 min   Patient Stated Goals to get back to work   Currently in Pain? Yes   Pain Score 2    Pain Location Back   Pain Orientation Mid   Pain Descriptors / Indicators Aching   Pain Type Acute pain   Pain Onset More than a month ago   Pain Frequency Constant                         [facility] Adult PT Treatment/Exercise - 03/08/17 0001      Lumbar Exercises: Aerobic   Stationary Bike Nustep L5 x 10 minutes   UBE (Upper Arm Bike) 120 RPMs x 5 mins     Lumbar Exercises: Standing   Scapular Retraction Both;20 reps;Theraband   Theraband Level (Scapular Retraction) Other (comment)  pink XTS   Shoulder Extension  Both;20 reps;Theraband   Theraband Level (Shoulder Extension) Other (comment)  pink XTS     Knee/Hip Exercises: Standing   Lateral Step Up 10 reps;Step Height: 6";3 sets   Forward Step Up Both;10 reps;Step Height: 6";3 sets   Step Down 2 sets;10 reps   Rocker Board 3 minutes     Modalities   Modalities Electrical Stimulation     Moist Heat Therapy   Number Minutes Moist Heat 15 Minutes   Moist Heat Location Lumbar Spine;Other (comment)     Electrical Stimulation   Electrical Stimulation Location T11/12 premod 80-150 hz x 15 mins   Electrical Stimulation Goals Pain                     PT Long Term Goals - 02/24/17 1257      PT LONG TERM GOAL #1   Title I with HEP   Time 6   Period Weeks   Status Achieved     PT LONG TERM GOAL #2   Title Patient able to peform ADLS with <= 1-5/40 pain in the back.   Time 6   Period Weeks   Status  Achieved  1/10 pain with ADL's 02/24/17     PT LONG TERM GOAL #3   Title Patient able to sleep without waking from pain.   Time 6   Period Weeks   Status Achieved     PT LONG TERM GOAL #4   Title Patient to demo 4+/5 R hip flexion to ease ADLS.   Time 6   Period Weeks   Status On-going               Plan - 03/08/17 0847    Clinical Impression Statement Pt  arrived today doing ok, but had a flare-up over the weekend in LBP after performing house work and attending two funerals. She did well with therex today with minimal increase in pain. Normal response with modalities.   Rehab Potential Excellent   PT Frequency 2x / week   PT Duration 6 weeks   PT Treatment/Interventions ADLs/Self Care Home Management;Electrical Stimulation;Moist Heat;Therapeutic activities;Neuromuscular re-education;Patient/family education;Manual techniques;Dry needling   PT Next Visit Plan cont with POC per MPT to progress prone thoracic ext exercises as tolerated,  Core strengthening; modalities for pain prn.   PT Home Exercise Plan pelvic press  series including cerv retraction; bridge piriformis stretch   Consulted and Agree with Plan of Care Patient      Patient will benefit from skilled therapeutic intervention in order to improve the following deficits and impairments:  Pain, Postural dysfunction, Impaired flexibility, Decreased strength, Decreased activity tolerance, Difficulty walking, Impaired perceived functional ability  Visit Diagnosis: Pain in thoracic spine  Muscle weakness (generalized)     Problem List Patient Active Problem List   Diagnosis Date Noted  . Osteoporosis 02/09/2017  . History of vertebral fracture 02/09/2017  . GERD (gastroesophageal reflux disease) 03/11/2016  . COPD exacerbation ([place]) 03/11/2016  . Hyperlipidemia with target LDL less than 100 02/19/2015  . Adjustment disorder with mixed anxiety and depressed mood 03/18/2014  . Hypertension 03/18/2014    Naimah Yingst,CHRIS, PTA 03/08/2017, 9:16 AM  [facility] [address] [place], [place], [address] Phone: [phone]   Fax:  [phone]  Name: RITA VIALPANDO MRN: [id number] Date of Birth: 1951/02/17

## 2017-03-10 ENCOUNTER — Ambulatory Visit: Payer: Medicare Other | Admitting: *Deleted

## 2017-03-10 DIAGNOSIS — M6281 Muscle weakness (generalized): Secondary | ICD-10-CM

## 2017-03-10 DIAGNOSIS — M546 Pain in thoracic spine: Secondary | ICD-10-CM | POA: Diagnosis not present

## 2017-03-10 NOTE — Therapy (Addendum)
[facility] [place], [place], [address] Phone: [phone]   Fax:  [phone]  Physical Therapy Treatment  Patient Details  Name: Jody Wilson MRN: [id number] Date of Birth: Nov 28, 1951 Referring Provider: Rennis Harding MD  Encounter Date: 03/10/2017      PT End of Session - 03/10/17 0830    Visit Number 10   Number of Visits 12   Date for PT Re-Evaluation 03/15/17   PT Start Time 0818   PT Stop Time 0912   PT Time Calculation (min) 54 min      Past Medical History:  Diagnosis Date  . Depression   . Hypertension     Past Surgical History:  Procedure Laterality Date  . ABDOMINAL HYSTERECTOMY    . CHOLECYSTECTOMY    . FOOT SURGERY     7 in the past  . IR GENERIC HISTORICAL  12/09/2016   IR RADIOLOGIST EVAL & MGMT 12/09/2016 [facility]  . IR GENERIC HISTORICAL  12/10/2016   IR VERTEBROPLASTY CERV/THOR BX INC UNI/BIL INC/INJECT/IMAGING 12/10/2016 [facility]  . TRACHEOSTOMY TUBE PLACEMENT  1982    There were no vitals filed for this visit.      Subjective Assessment - 03/10/17 0828    Subjective Doing better now. 1/10 pain. 90% better than 1st visit   Pertinent History Osteoporosis, COPD, HTN   How long can you sit comfortably? 4-6 hrs   How long can you walk comfortably? 1hr   Diagnostic tests xrays   Patient Stated Goals to get back to work   Currently in Pain? Yes   Pain Score 1    Pain Location Back   Pain Orientation Mid                         [facility] Adult PT Treatment/Exercise - 03/10/17 0001      Lumbar Exercises: Aerobic   Stationary Bike Nustep L5 x 15 minutes   UBE (Upper Arm Bike) 120 RPMs x 5 mins     Lumbar Exercises: Standing   Scapular Retraction Both;20 reps;Theraband   Theraband Level (Scapular Retraction) Other (comment)  pink XTS   Shoulder Extension Both;20 reps;Theraband   Theraband Level (Shoulder Extension) Other (comment)  pink XTS     Knee/Hip  Exercises: Standing   Lateral Step Up 10 reps;Step Height: 6";3 sets   Forward Step Up Both;10 reps;Step Height: 6";3 sets   Rocker Board 3 minutes     Modalities   Modalities Electrical Stimulation     Moist Heat Therapy   Number Minutes Moist Heat 15 Minutes   Moist Heat Location Lumbar Spine;Other (comment)     Electrical Stimulation   Electrical Stimulation Location T11/12 premod 80-150 hz x 15 mins   Electrical Stimulation Goals Pain                     PT Long Term Goals - 02/24/17 1257      PT LONG TERM GOAL #1   Title I with HEP   Time 6   Period Weeks   Status Achieved     PT LONG TERM GOAL #2   Title Patient able to peform ADLS with <= 1-3/24 pain in the back.   Time 6   Period Weeks   Status Achieved  1/10 pain with ADL's 02/24/17     PT LONG TERM GOAL #3   Title Patient able to sleep without waking from pain.  Time 6   Period Weeks   Status Achieved     PT LONG TERM GOAL #4   Title Patient to demo 4+/5 R hip flexion to ease ADLS.   Time 6   Period Weeks   Status On-going               Plan - 2017-04-09 0830    Clinical Impression Statement Pt arrived to clinic today feeling better with 1/10 LBP. Overall feels 90% better. FOTO 44%  limitation is . Pt was able to perform all strengthening and postural act.'s with minimal pain increase .   Rehab Potential Excellent   PT Frequency 2x / week   PT Duration 6 weeks   PT Treatment/Interventions ADLs/Self Care Home Management;Electrical Stimulation;Moist Heat;Therapeutic activities;Neuromuscular re-education;Patient/family education;Manual techniques;Dry needling   PT Next Visit Plan cont with POC per MPT to progress prone thoracic ext exercises as tolerated,  Core strengthening; modalities for pain prn.   PT Home Exercise Plan pelvic press series including cerv retraction; bridge piriformis stretch   Consulted and Agree with Plan of Care Patient      Patient will benefit from skilled  therapeutic intervention in order to improve the following deficits and impairments:  Pain, Postural dysfunction, Impaired flexibility, Decreased strength, Decreased activity tolerance, Difficulty walking, Impaired perceived functional ability  Visit Diagnosis: Pain in thoracic spine  Muscle weakness (generalized)       G-Codes - 09-Apr-2017 1540    Functional Assessment Tool Used (Outpatient Only) FOTO 4% LIMITED...10th visit.   Functional Limitation Other PT primary   Other PT Primary Current Status (K7425) At least 40 percent but less than 60 percent impaired, limited or restricted   Other PT Primary Goal Status (Z5638) At least 40 percent but less than 60 percent impaired, limited or restricted      Problem List Patient Active Problem List   Diagnosis Date Noted  . Osteoporosis 02/09/2017  . History of vertebral fracture 02/09/2017  . GERD (gastroesophageal reflux disease) 03/11/2016  . COPD exacerbation ([place]) 03/11/2016  . Hyperlipidemia with target LDL less than 100 02/19/2015  . Adjustment disorder with mixed anxiety and depressed mood 03/18/2014  . Hypertension 03/18/2014    APPLEGATE, [place], PTA April 09, 2017, 3:41 PM [name] MPT [facility] [address] [place], [place], [address] Phone: [phone]   Fax:  [phone]  Name: Jody Wilson MRN: [id number] Date of Birth: 06/01/1951

## 2017-03-11 ENCOUNTER — Encounter: Payer: Medicare Other | Admitting: *Deleted

## 2017-03-14 DIAGNOSIS — M546 Pain in thoracic spine: Secondary | ICD-10-CM | POA: Diagnosis not present

## 2017-03-14 DIAGNOSIS — S22080A Wedge compression fracture of T11-T12 vertebra, initial encounter for closed fracture: Secondary | ICD-10-CM | POA: Diagnosis not present

## 2017-03-15 ENCOUNTER — Ambulatory Visit: Payer: Medicare Other | Admitting: Physical Therapy

## 2017-03-15 ENCOUNTER — Encounter: Payer: Self-pay | Admitting: Physical Therapy

## 2017-03-15 DIAGNOSIS — M6281 Muscle weakness (generalized): Secondary | ICD-10-CM

## 2017-03-15 DIAGNOSIS — M546 Pain in thoracic spine: Secondary | ICD-10-CM | POA: Diagnosis not present

## 2017-03-15 NOTE — Therapy (Addendum)
[facility] [place], [place], [address] Phone: [phone]   Fax:  [phone]  Physical Therapy Treatment  Patient Details  Name: Jody Wilson MRN: [id number] Date of Birth: 1951-10-25 Referring Provider: Rennis Harding MD  Encounter Date: 03/15/2017      PT End of Session - 03/15/17 1441    Visit Number 11   Number of Visits 12   Date for PT Re-Evaluation 03/15/17   PT Start Time 1436   PT Stop Time 1517   PT Time Calculation (min) 41 min   Activity Tolerance Patient tolerated treatment well   Behavior During Therapy [facility] for tasks assessed/performed      Past Medical History:  Diagnosis Date  . Depression   . Hypertension     Past Surgical History:  Procedure Laterality Date  . ABDOMINAL HYSTERECTOMY    . CHOLECYSTECTOMY    . FOOT SURGERY     7 in the past  . IR GENERIC HISTORICAL  12/09/2016   IR RADIOLOGIST EVAL & MGMT 12/09/2016 [facility]INTER[facility]  . IR GENERIC HISTORICAL  12/10/2016   IR VERTEBROPLASTY CERV/THOR BX INC UNI/BIL INC/INJECT/IMAGING 12/10/2016 [facility]  . TRACHEOSTOMY TUBE PLACEMENT  1982    There were no vitals filed for this visit.      Subjective Assessment - 03/15/17 1436    Subjective Reports that she is having a bad day and did a lot of driving yesterday. Reports that she is to return to work part time 03/22/2017 and was warned that she may have some back aches and may need to take muscle relaxers for alittle bit after work.   Pertinent History Osteoporosis, COPD, HTN   How long can you sit comfortably? 4-6 hrs   How long can you walk comfortably? 1hr   Diagnostic tests xrays   Patient Stated Goals to get back to work   Currently in Pain? Yes   Pain Score 3    Pain Location Back   Pain Orientation Right;Mid   Pain Type Acute pain   Pain Onset More than a month ago            [facility] PT Assessment - 03/15/17 0001      Assessment   Medical Diagnosis T11/12 compression fx    Onset Date/Surgical Date 11/05/17   Next MD Visit PRN     Precautions   Precautions None                     [facility] Adult PT Treatment/Exercise - 03/15/17 0001      Lumbar Exercises: Aerobic   Stationary Bike Nustep L5 x 16 minutes   UBE (Upper Arm Bike) 120 RPMs x 5 mins     Lumbar Exercises: Standing   Scapular Retraction Both;20 reps;Theraband   Scapular Retraction Limitations B split stance with pink XTS   Other Standing Lumbar Exercises B shoulder horizontal abduction red theraband with core activation x20 reps     Lumbar Exercises: Supine   Bent Knee Raise 15 reps;3 seconds   Bridge 20 reps;3 seconds   Bridge Limitations with red theraband across pelvic for resistance   Straight Leg Raise 15 reps;2 seconds     Knee/Hip Exercises: Standing   Lateral Step Up Both;3 sets;10 reps;Hand Hold: 2;Step Height: 6"   Forward Step Up Both;3 sets;10 reps;Hand Hold: 2;Step Height: 6"                     PT  Long Term Goals - 02/24/17 1257      PT LONG TERM GOAL #1   Title I with HEP   Time 6   Period Weeks   Status Achieved     PT LONG TERM GOAL #2   Title Patient able to peform ADLS with <= 1-6/57 pain in the back.   Time 6   Period Weeks   Status Achieved  1/10 pain with ADL's 02/24/17     PT LONG TERM GOAL #3   Title Patient able to sleep without waking from pain.   Time 6   Period Weeks   Status Achieved     PT LONG TERM GOAL #4   Title Patient to demo 4+/5 R hip flexion to ease ADLS.   Time 6   Period Weeks   Status On-going               Plan - 03/15/17 1518    Clinical Impression Statement Patient arrived to treatment with 3/10 pain but reported following warm up that pain was reducing and she felt better. Patient guided through spine stabilization and core strengthening exercises with VCs throughout treatment for core activation. Patient denied modalities today and had no complaints with any increased resistance. Patient had  no negetive complaints following today's treatment.   Rehab Potential Excellent   PT Frequency 2x / week   PT Duration 6 weeks   PT Treatment/Interventions ADLs/Self Care Home Management;Electrical Stimulation;Moist Heat;Therapeutic activities;Neuromuscular re-education;Patient/family education;Manual techniques;Dry needling   PT Next Visit Plan D/C summary required next treatment. Patient to RTW 03/22/2017.   PT Home Exercise Plan pelvic press series including cerv retraction; bridge piriformis stretch   Consulted and Agree with Plan of Care Patient      Patient will benefit from skilled therapeutic intervention in order to improve the following deficits and impairments:  Pain, Postural dysfunction, Impaired flexibility, Decreased strength, Decreased activity tolerance, Difficulty walking, Impaired perceived functional ability  Visit Diagnosis: Pain in thoracic spine  Muscle weakness (generalized)     Problem List Patient Active Problem List   Diagnosis Date Noted  . Osteoporosis 02/09/2017  . History of vertebral fracture 02/09/2017  . GERD (gastroesophageal reflux disease) 03/11/2016  . COPD exacerbation ([place]) 03/11/2016  . Hyperlipidemia with target LDL less than 100 02/19/2015  . Adjustment disorder with mixed anxiety and depressed mood 03/18/2014  . Hypertension 03/18/2014    Wynelle Fanny, PTA 03/15/2017, 3:22 PM  [facility] [address] [place], [place], [address] Phone: [phone]   Fax:  [phone]  Name: Jody Wilson MRN: [id number] Date of Birth: 22-Feb-1951  PHYSICAL THERAPY DISCHARGE SUMMARY  Visits from Start of Care: 11.  Current functional level related to goals / functional outcomes: See above.   Remaining deficits: Goals essentially met.   Education / Equipment: HEP. Plan: Patient agrees to discharge.  Patient goals were met. Patient is being discharged due to being pleased with the current  functional level.  ?????         [name] MPT

## 2017-03-17 ENCOUNTER — Encounter: Payer: Medicare Other | Admitting: *Deleted

## 2017-04-01 ENCOUNTER — Encounter: Payer: Self-pay | Admitting: Family Medicine

## 2017-04-01 ENCOUNTER — Ambulatory Visit (INDEPENDENT_AMBULATORY_CARE_PROVIDER_SITE_OTHER): Payer: Medicare Other | Admitting: Family Medicine

## 2017-04-01 VITALS — BP 121/78 | HR 69 | Temp 99.6°F | Ht 65.0 in | Wt 151.0 lb

## 2017-04-01 DIAGNOSIS — H60332 Swimmer's ear, left ear: Secondary | ICD-10-CM

## 2017-04-01 DIAGNOSIS — R252 Cramp and spasm: Secondary | ICD-10-CM

## 2017-04-01 MED ORDER — NEOMYCIN-POLYMYXIN-HC 3.5-10000-1 OT SOLN: 3.0000 [drp] | Freq: Four times a day (QID) | OTIC | 0 refills | Status: DC

## 2017-04-01 MED ORDER — FLUOCINOLONE ACETONIDE 0.01 % OT OIL: 5.0000 [drp] | TOPICAL_OIL | Freq: Two times a day (BID) | OTIC | 2 refills | Status: DC

## 2017-04-01 NOTE — Progress Notes (Signed)
BP 121/78   Pulse 69   Temp 99.6 F (37.6 C) (Oral)   Ht 5\' 5"  (1.651 m)   Wt 151 lb (68.5 kg)   BMI 25.13 kg/m    Subjective:    Patient ID: Jody Wilson, female    DOB: August 30, 1951, 66 y.o.   MRN: [id number]  HPI: Jody Wilson is a 66 y.o. female presenting on 04/01/2017 for Left ear pain (swelling behind ear, draining; began 2 weeks ago)   HPI Left ear pain and drainage and swelling Patient has been having left ear pain and drainage has been going on for the past 2 weeks. 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 She is using topical steroid will drop in her ear but she ran out of and has not been using it for some time. She does have some ear pain and some dizziness associated with it. She denies any fevers or chills. She does have some congestion but she likely attributes that to the seasonal allergies that she gets frequently. The drainage has been thicker and more crusty and she's finding on her pill at night. The pain is mild to moderate in the ear but she does feel swollen behind the ear as well.  Relevant past medical, surgical, family and social history reviewed and updated as indicated. Interim medical history since our last visit reviewed. Allergies and medications reviewed and updated.  Review of Systems  Constitutional: Negative for chills and fever.  HENT: Positive for congestion, ear discharge and ear pain. Negative for rhinorrhea, sinus pain, sinus pressure and sneezing.   Eyes: Negative for redness and visual disturbance.  Respiratory: Negative for cough, chest tightness and shortness of breath.   Cardiovascular: Negative for chest pain and leg swelling.  Genitourinary: Negative for difficulty urinating and dysuria.  Musculoskeletal: Negative for back pain and gait problem.  Skin: Negative for rash.  Neurological: Negative for light-headedness and headaches.  Psychiatric/Behavioral:  Negative for agitation and behavioral problems.  All other systems reviewed and are negative.   Per HPI unless specifically indicated above        Objective:    BP 121/78   Pulse 69   Temp 99.6 F (37.6 C) (Oral)   Ht 5\' 5"  (1.651 m)   Wt 151 lb (68.5 kg)   BMI 25.13 kg/m   Wt Readings from Last 3 Encounters:  04/01/17 151 lb (68.5 kg)  02/08/17 153 lb 8 oz (69.6 kg)  01/07/17 156 lb 6.4 oz (70.9 kg)    Physical Exam  Constitutional: She is oriented to person, place, and time. She appears well-developed and well-nourished. No distress.  HENT:  Right Ear: Hearing, tympanic membrane and ear canal normal.  Left Ear: Hearing and tympanic membrane normal. There is drainage, swelling and tenderness. Tympanic membrane is not injected. No hemotympanum.  Nose: Nose normal.  Mouth/Throat: Uvula is midline, oropharynx is clear and moist and mucous membranes are normal.  Eyes: Conjunctivae are normal.  Cardiovascular: Normal rate, regular rhythm, normal heart sounds and intact distal pulses.   No murmur heard. Pulmonary/Chest: Effort normal and breath sounds normal. No respiratory distress. She has no wheezes. She has no rales.  Musculoskeletal: Normal range of motion.  Neurological: She is alert and oriented to person, place, and time. Coordination normal.  Skin: Skin is warm and dry. No rash noted. She is not diaphoretic.  Psychiatric: She has a normal mood and  affect. Her behavior is normal.  Nursing note and vitals reviewed.     Assessment & Plan:   Problem List Items Addressed This Visit    None    Visit Diagnoses    Acute swimmer's ear of left side    -  Primary   Relevant Medications   neomycin-polymyxin-hydrocortisone (CORTISPORIN) otic solution   Fluocinolone Acetonide 0.01 % OIL       Follow up plan: Return if symptoms worsen or fail to improve.  Counseling provided for all of the vaccine components No orders of the defined types were placed in this  encounter.   [name], MD [facility] 04/01/2017, 10:56 AM

## 2017-04-05 ENCOUNTER — Telehealth: Payer: Self-pay

## 2017-04-06 MED ORDER — DEXAMETHASONE SODIUM PHOSPHATE 0.1 % OP SOLN: OPHTHALMIC | 0 refills | Status: DC

## 2017-04-06 NOTE — Telephone Encounter (Signed)
I do not see any dexamethasone otic drops, I do not see them in the computer or looking him up online. If we can call the pharmacy and they have these then I would be more than happy to send them. Patient also says that she used to be on betamethasone eardrops but I could not find those in our system either. We likely need to call the pharmacy and see what type of steroidal eardrops they have

## 2017-04-06 NOTE — Telephone Encounter (Signed)
[facility] to see which ear drops to prescribe patient.  Per pharmacist patient can use dexamthasone eye drop in the ear.  Sent new Rx to [facility] per Dr. Warrick Parisian.   Left message to let patient know ear drops have been sent to the pharmacy and to call the office with any questions or concerns.

## 2017-05-06 ENCOUNTER — Other Ambulatory Visit: Payer: Self-pay | Admitting: Family Medicine

## 2017-05-06 DIAGNOSIS — I10 Essential (primary) hypertension: Secondary | ICD-10-CM

## 2017-05-12 ENCOUNTER — Telehealth: Payer: Self-pay | Admitting: Family Medicine

## 2017-05-13 NOTE — Addendum Note (Signed)
Addended by: Cherre Robins B on: 05/13/2017 12:00 PM   Modules accepted: Orders

## 2017-05-13 NOTE — Telephone Encounter (Signed)
Patient was unable to use copay card due to Medicare coverage.  I am going to leave 2 more months of samples for Forteo and then will switch to bisphosphonate.  Patient also states that she is having leg pain from time to time - somewhat like cramping but different.  She is to come in to have BMET / calcium checked.

## 2017-05-21 ENCOUNTER — Other Ambulatory Visit: Payer: Medicare Other

## 2017-05-21 DIAGNOSIS — R252 Cramp and spasm: Secondary | ICD-10-CM | POA: Diagnosis not present

## 2017-05-22 LAB — BMP8+EGFR
BUN/Creatinine Ratio: 24 (ref 12–28)
BUN: 21 mg/dL (ref 8–27)
CO2: 22 mmol/L (ref 18–29)
Calcium: 9.3 mg/dL (ref 8.7–10.3)
Chloride: 104 mmol/L (ref 96–106)
Creatinine, Ser: 0.87 mg/dL (ref 0.57–1.00)
GFR calc Af Amer: 81 mL/min/{1.73_m2} (ref >59)
GFR calc non Af Amer: 70 mL/min/{1.73_m2} (ref >59)
Glucose: 78 mg/dL (ref 65–99)
Potassium: 4.8 mmol/L (ref 3.5–5.2)
Sodium: 141 mmol/L (ref 134–144)

## 2017-06-16 DIAGNOSIS — Z6825 Body mass index (BMI) 25.0-25.9, adult: Secondary | ICD-10-CM | POA: Diagnosis not present

## 2017-06-16 DIAGNOSIS — Z01419 Encounter for gynecological examination (general) (routine) without abnormal findings: Secondary | ICD-10-CM | POA: Diagnosis not present

## 2017-06-24 ENCOUNTER — Other Ambulatory Visit: Payer: Self-pay | Admitting: Family Medicine

## 2017-06-24 DIAGNOSIS — F4323 Adjustment disorder with mixed anxiety and depressed mood: Secondary | ICD-10-CM

## 2017-07-14 ENCOUNTER — Ambulatory Visit (INDEPENDENT_AMBULATORY_CARE_PROVIDER_SITE_OTHER): Payer: Medicare Other | Admitting: Family Medicine

## 2017-07-14 ENCOUNTER — Encounter: Payer: Self-pay | Admitting: Family Medicine

## 2017-07-14 ENCOUNTER — Other Ambulatory Visit: Payer: Self-pay | Admitting: Surgery

## 2017-07-14 DIAGNOSIS — B001 Herpesviral vesicular dermatitis: Secondary | ICD-10-CM

## 2017-07-14 DIAGNOSIS — F4323 Adjustment disorder with mixed anxiety and depressed mood: Secondary | ICD-10-CM

## 2017-07-14 DIAGNOSIS — I10 Essential (primary) hypertension: Secondary | ICD-10-CM | POA: Diagnosis not present

## 2017-07-14 DIAGNOSIS — K219 Gastro-esophageal reflux disease without esophagitis: Secondary | ICD-10-CM | POA: Diagnosis not present

## 2017-07-14 DIAGNOSIS — N6459 Other signs and symptoms in breast: Secondary | ICD-10-CM

## 2017-07-14 MED ORDER — CLOBETASOL PROPIONATE 0.05 % EX LIQD: Freq: Two times a day (BID) | CUTANEOUS | 11 refills | Status: DC

## 2017-07-14 MED ORDER — ALPRAZOLAM 0.5 MG PO TABS: 0.5000 mg | ORAL_TABLET | Freq: Two times a day (BID) | ORAL | 3 refills | Status: DC | PRN

## 2017-07-14 MED ORDER — SERTRALINE HCL 50 MG PO TABS: 50.0000 mg | ORAL_TABLET | Freq: Every day | ORAL | 5 refills | Status: DC

## 2017-07-14 MED ORDER — VALACYCLOVIR HCL 1 G PO TABS: 1000.0000 mg | ORAL_TABLET | Freq: Two times a day (BID) | ORAL | 3 refills | Status: DC

## 2017-07-14 MED ORDER — PANTOPRAZOLE SODIUM 40 MG PO TBEC
40.0000 mg | DELAYED_RELEASE_TABLET | Freq: Every day | ORAL | 6 refills | Status: DC
Start: 2017-07-14 — End: 2018-05-18

## 2017-07-14 NOTE — Progress Notes (Signed)
BP 109/61   Pulse 71   Temp 98.4 F (36.9 C) (Oral)   Ht 5' 5"  (1.651 m)   Wt 149 lb (67.6 kg)   BMI 24.79 kg/m    Subjective:    Patient ID: Jody Wilson, female    DOB: 09/10/1951, 66 y.o.   MRN: [id number]  HPI: [name] is a 65 y.o. female presenting on 07/14/2017 for Hyperlipidemia (6 mo followup); Hypertension; and Medication refills (Xanax, Hctz and Zanaflex)   HPI Hypertension Patient is currently on Hydrochlorothiazide 12.5 mg and Benicar 20 mg, and their blood pressure today is 109/61. Patient denies any lightheadedness or dizziness. Patient denies headaches, blurred vision, chest pains, shortness of breath, or weakness. Denies any side effects from medication and is content with current medication.   Anxiety and depression and adjustment Patient is coming in for recheck of anxiety and depression and says she is doing very well on her current medication. She uses Zoloft and occasional alprazolam. She said she only uses it every 2-3 days when her anxiety builds up significantly but when she does use that she uses a couple of the pills during that day. She denies any suicidal ideations about herself. She is quite happy with where she is at with her medications for this.  Cold sore medication refill Patient needs a medication refill for cold sore that she has had in past. She does not have one currently does not have any issues currently.  GERD recheck Patient is currently taking protonix 40 mg and says she is doing very well on that and denies any issues or symptoms from acid reflux. She denies any blood in her stool. She denies any heartburn. Just needs a refill today.  Relevant past medical, surgical, family and social history reviewed and updated as indicated. Interim medical history since our last visit reviewed. Allergies and medications reviewed and updated.  Review of Systems  Constitutional: Negative for chills and fever.  Eyes: Negative for visual  disturbance.  Respiratory: Negative for chest tightness and shortness of breath.   Cardiovascular: Negative for chest pain and leg swelling.  Gastrointestinal: Negative for abdominal pain, blood in stool, constipation, diarrhea, nausea and vomiting.  Musculoskeletal: Negative for back pain and gait problem.  Skin: Negative for rash.  Neurological: Negative for light-headedness and headaches.  Psychiatric/Behavioral: Positive for dysphoric mood. Negative for agitation, behavioral problems, decreased concentration, self-injury, sleep disturbance and suicidal ideas. The patient is nervous/anxious.   All other systems reviewed and are negative.   Per HPI unless specifically indicated above        Objective:    BP 109/61   Pulse 71   Temp 98.4 F (36.9 C) (Oral)   Ht 5' 5"  (1.651 m)   Wt 149 lb (67.6 kg)   BMI 24.79 kg/m   Wt Readings from Last 3 Encounters:  07/14/17 149 lb (67.6 kg)  04/01/17 151 lb (68.5 kg)  02/08/17 153 lb 8 oz (69.6 kg)    Physical Exam  Constitutional: She is oriented to person, place, and time. She appears well-developed and well-nourished. No distress.  Eyes: Conjunctivae are normal.  Neck: Neck supple. No thyromegaly present.  Cardiovascular: Normal rate, regular rhythm, normal heart sounds and intact distal pulses.   No murmur heard. Pulmonary/Chest: Effort normal and breath sounds normal. No respiratory distress. She has no wheezes. She has no rales.  Abdominal: Soft. Bowel sounds are normal. She exhibits no distension. There is no tenderness. There is no rebound.  Musculoskeletal: Normal range of motion. She exhibits no edema or tenderness.  Lymphadenopathy:    She has no cervical adenopathy.  Neurological: She is alert and oriented to person, place, and time. Coordination normal.  Skin: Skin is warm and dry. No rash noted. She is not diaphoretic.  Psychiatric: She has a normal mood and affect. Her behavior is normal.  Nursing note and vitals  reviewed.   Results for orders placed or performed in visit on 05/21/17  [facility]  Result Value Ref Range   Glucose 78 65 - 99 mg/dL   BUN 21 8 - 27 mg/dL   Creatinine, Ser 0.87 0.57 - 1.00 mg/dL   GFR calc non Af Amer 70 >59 mL/min/1.73   GFR calc Af Amer 81 >59 mL/min/1.73   BUN/Creatinine Ratio 24 12 - 28   Sodium 141 134 - 144 mmol/L   Potassium 4.8 3.5 - 5.2 mmol/L   Chloride 104 96 - 106 mmol/L   CO2 22 18 - 29 mmol/L   Calcium 9.3 8.7 - 10.3 mg/dL      Assessment & Plan:   Problem List Items Addressed This Visit      Cardiovascular and Mediastinum   Hypertension     Digestive   GERD (gastroesophageal reflux disease)   Relevant Medications   pantoprazole (PROTONIX) 40 MG tablet     Other   Adjustment disorder with mixed anxiety and depressed mood   Relevant Medications   sertraline (ZOLOFT) 50 MG tablet   ALPRAZolam (XANAX) 0.5 MG tablet    Other Visit Diagnoses    Cold sore       Relevant Medications   valACYclovir (VALTREX) 1000 MG tablet       Follow up plan: Return in about 6 months (around 01/14/2018), or if symptoms worsen or fail to improve, for Fasting labs and recheck hypertension and anxiety.  Counseling provided for all of the vaccine components No orders of the defined types were placed in this encounter.   Jody Pina, MD [facility] 07/14/2017, 5:32 PM

## 2017-07-15 ENCOUNTER — Telehealth: Payer: Self-pay

## 2017-07-15 NOTE — Telephone Encounter (Signed)
Insurance denied prior auth for Clobetasol   Need to try Mometasone topical cream triamcinolone acetonide topical cream and fluocinolone topical cream

## 2017-07-18 MED ORDER — CLOBETASOL PROPIONATE 0.05 % EX SOLN: 1.0000 "application " | Freq: Two times a day (BID) | CUTANEOUS | 3 refills | Status: DC

## 2017-07-18 NOTE — Telephone Encounter (Signed)
I don't know what to do necessarily about this, patient was looking for a topical liquid steroid that we could use as drops in the ear. Maybe we should give the pharmacy a call and see if there is anything that they carry that would be covered for her

## 2017-07-18 NOTE — Addendum Note (Signed)
Addended by: Marin Olp on: 07/18/2017 12:03 PM   Modules accepted: Orders

## 2017-07-18 NOTE — Telephone Encounter (Signed)
RX changed to lotion per pharmacy recommendation Okayed per Dr Warrick Parisian

## 2017-08-06 ENCOUNTER — Encounter: Payer: Self-pay | Admitting: Family Medicine

## 2017-08-06 ENCOUNTER — Ambulatory Visit (INDEPENDENT_AMBULATORY_CARE_PROVIDER_SITE_OTHER): Payer: Medicare Other | Admitting: Family Medicine

## 2017-08-06 VITALS — BP 139/81 | HR 61 | Temp 98.6°F | Ht 65.0 in | Wt 147.1 lb

## 2017-08-06 DIAGNOSIS — T148XXA Other injury of unspecified body region, initial encounter: Secondary | ICD-10-CM

## 2017-08-06 DIAGNOSIS — R5383 Other fatigue: Secondary | ICD-10-CM

## 2017-08-06 DIAGNOSIS — R233 Spontaneous ecchymoses: Secondary | ICD-10-CM

## 2017-08-06 DIAGNOSIS — R6889 Other general symptoms and signs: Secondary | ICD-10-CM | POA: Diagnosis not present

## 2017-08-06 NOTE — Progress Notes (Signed)
BP 139/81   Pulse 61   Temp 98.6 F (37 C) (Oral)   Ht 5\' 5"  (1.651 m)   Wt 147 lb 2 oz (66.7 kg)   BMI 24.48 kg/m    Subjective:    Patient ID: Jody Wilson, female    DOB: [birth year], 66 y.o.   MRN: [id number]  HPI: Jody Wilson is a 66 y.o. female presenting on 08/06/2017 for Fatigue, weight loss, easily bruising, pain in bones in legs (would like labwork today; 16 pound weight loss since December, symptoms began several months ago but seem to be worsening)   HPI Decreased energy and weight loss and bruising Patient is coming in with complaints of decreased energy and weight loss and bruising that has been increasing over the past 3 or 4 months. She also complains of a 16 pound weight loss since December and she has not been trying to lose weight. She does admit that she is a smoker and has been smoking but otherwise keeps up on cancer screening and has not had any signs of cervical issues or colon issues. She has not had a chest x-ray or CT chest anytime recently. She does complain of easy bruising and fatigue and weight loss that has been going on over this past time as well. She says a lot of this started since she started treatment for osteoporosis with the Forteo. She does complain of hair loss and troubles with feeling hot most of the time. She denies any cough or congestion or shortness of breath or wheezing or abdominal complaints or dysuria or bowel complaints. The bruising that she has is mostly on her lower legs but she does not remember hitting them anywhere that would've caused the bruising.  Relevant past medical, surgical, family and social history reviewed and updated as indicated. Interim medical history since our last visit reviewed. Allergies and medications reviewed and updated.  Review of Systems  Constitutional: Positive for fatigue and unexpected weight change. Negative for activity change, appetite change, chills and fever.  HENT: Negative for congestion,  ear discharge, ear pain, sinus pressure and sore throat.   Eyes: Negative for redness and visual disturbance.  Respiratory: Negative for chest tightness, shortness of breath and wheezing.   Cardiovascular: Negative for chest pain, palpitations and leg swelling.  Gastrointestinal: Negative for abdominal pain, constipation, diarrhea, nausea and vomiting.  Genitourinary: Negative for difficulty urinating and dysuria.  Musculoskeletal: Negative for back pain and gait problem.  Skin: Negative for color change and rash.  Neurological: Negative for dizziness, light-headedness and headaches.  Psychiatric/Behavioral: Negative for agitation and behavioral problems.  All other systems reviewed and are negative.   Per HPI unless specifically indicated above    Objective:    BP 139/81   Pulse 61   Temp 98.6 F (37 C) (Oral)   Ht 5\' 5"  (1.651 m)   Wt 147 lb 2 oz (66.7 kg)   BMI 24.48 kg/m   Wt Readings from Last 3 Encounters:  08/06/17 147 lb 2 oz (66.7 kg)  07/14/17 149 lb (67.6 kg)  04/01/17 151 lb (68.5 kg)    Physical Exam  Constitutional: She is oriented to person, place, and time. She appears well-developed and well-nourished. No distress.  HENT:  Nose: Nose normal.  Mouth/Throat: Oropharynx is clear and moist.  Eyes: Conjunctivae are normal.  Neck: Neck supple. No thyromegaly present.  Cardiovascular: Normal rate, regular rhythm, normal heart sounds and intact distal pulses.   No murmur heard. Pulmonary/Chest: Effort  normal and breath sounds normal. No respiratory distress. She has no wheezes. She has no rales.  Abdominal: Soft. Bowel sounds are normal. She exhibits no distension. There is no tenderness. There is no rebound and no guarding.  Musculoskeletal: Normal range of motion. She exhibits no edema or tenderness.  Lymphadenopathy:    She has no cervical adenopathy.  Neurological: She is alert and oriented to person, place, and time. Coordination normal.  Skin: Skin is warm  and dry. Bruising (Faint bruises spread on lower legs) noted. No rash noted. She is not diaphoretic.  Psychiatric: She has a normal mood and affect. Her behavior is normal. Thought content normal.  Nursing note and vitals reviewed.     Assessment & Plan:   Problem List Items Addressed This Visit    None    Visit Diagnoses    Decreased energy    -  Primary   Relevant Orders   Anemia Profile B   Thyroid Panel With TSH   Bruising       Relevant Orders   Anemia Profile B   Thyroid Panel With TSH       Follow up plan: Return in about 4 weeks (around 09/03/2017), or if symptoms worsen or fail to improve, for Recheck energy and weight loss, consider chest x-ray.  Counseling provided for all of the vaccine components Orders Placed This Encounter  Procedures  . Anemia Profile B  . Thyroid Panel With TSH    [name], MD [facility] 08/06/2017, 10:12 AM

## 2017-08-07 LAB — ANEMIA PROFILE B
Basophils Absolute: 0 10*3/uL (ref 0.0–0.2)
Basos: 1 %
EOS (ABSOLUTE): 0.1 10*3/uL (ref 0.0–0.4)
Eos: 1 %
Ferritin: 7 ng/mL — ABNORMAL LOW (ref 15–150)
Folate: 3.1 ng/mL (ref >3.0)
Hematocrit: 32.9 % — ABNORMAL LOW (ref 34.0–46.6)
Hemoglobin: 9.6 g/dL — ABNORMAL LOW (ref 11.1–15.9)
Immature Grans (Abs): 0 10*3/uL (ref 0.0–0.1)
Immature Granulocytes: 0 %
Iron Saturation: 6 % — CL (ref 15–55)
Iron: 26 ug/dL — ABNORMAL LOW (ref 27–139)
Lymphocytes Absolute: 1.8 10*3/uL (ref 0.7–3.1)
Lymphs: 26 %
MCH: 23.7 pg — ABNORMAL LOW (ref 26.6–33.0)
MCHC: 29.2 g/dL — ABNORMAL LOW (ref 31.5–35.7)
MCV: 81 fL (ref 79–97)
Monocytes Absolute: 0.5 10*3/uL (ref 0.1–0.9)
Monocytes: 7 %
Neutrophils Absolute: 4.5 10*3/uL (ref 1.4–7.0)
Neutrophils: 65 %
Platelets: 393 10*3/uL — ABNORMAL HIGH (ref 150–379)
RBC: 4.05 x10E6/uL (ref 3.77–5.28)
RDW: 17.4 % — ABNORMAL HIGH (ref 12.3–15.4)
Retic Ct Pct: 1.4 % (ref 0.6–2.6)
Total Iron Binding Capacity: 465 ug/dL — ABNORMAL HIGH (ref 250–450)
UIBC: 439 ug/dL — ABNORMAL HIGH (ref 118–369)
Vitamin B-12: 204 pg/mL — ABNORMAL LOW (ref 232–1245)
WBC: 6.9 10*3/uL (ref 3.4–10.8)

## 2017-08-07 LAB — THYROID PANEL WITH TSH
Free Thyroxine Index: 2.1 (ref 1.2–4.9)
T3 Uptake Ratio: 33 % (ref 24–39)
T4, Total: 6.4 ug/dL (ref 4.5–12.0)
TSH: 0.826 u[IU]/mL (ref 0.450–4.500)

## 2017-08-08 ENCOUNTER — Other Ambulatory Visit: Payer: Self-pay | Admitting: *Deleted

## 2017-08-08 MED ORDER — FERROUS SULFATE 325 (65 FE) MG PO TABS: 325.0000 mg | ORAL_TABLET | Freq: Every day | ORAL | 3 refills | Status: DC

## 2017-10-31 ENCOUNTER — Other Ambulatory Visit: Payer: Self-pay | Admitting: Family Medicine

## 2017-10-31 DIAGNOSIS — I10 Essential (primary) hypertension: Secondary | ICD-10-CM

## 2017-12-12 ENCOUNTER — Other Ambulatory Visit: Payer: Self-pay | Admitting: Surgery

## 2017-12-12 DIAGNOSIS — N6459 Other signs and symptoms in breast: Secondary | ICD-10-CM

## 2017-12-15 ENCOUNTER — Inpatient Hospital Stay: Admission: RE | Admit: 2017-12-15 | Payer: Medicare Other | Source: Ambulatory Visit

## 2017-12-15 ENCOUNTER — Other Ambulatory Visit: Payer: Medicare Other

## 2017-12-17 ENCOUNTER — Other Ambulatory Visit: Payer: Self-pay | Admitting: Family Medicine

## 2017-12-17 DIAGNOSIS — B001 Herpesviral vesicular dermatitis: Secondary | ICD-10-CM

## 2017-12-27 ENCOUNTER — Ambulatory Visit: Payer: Medicare Other | Admitting: Family Medicine

## 2018-01-09 DIAGNOSIS — H2513 Age-related nuclear cataract, bilateral: Secondary | ICD-10-CM | POA: Diagnosis not present

## 2018-01-09 DIAGNOSIS — H43811 Vitreous degeneration, right eye: Secondary | ICD-10-CM | POA: Diagnosis not present

## 2018-01-09 DIAGNOSIS — H52223 Regular astigmatism, bilateral: Secondary | ICD-10-CM | POA: Diagnosis not present

## 2018-01-09 DIAGNOSIS — H524 Presbyopia: Secondary | ICD-10-CM | POA: Diagnosis not present

## 2018-02-13 ENCOUNTER — Other Ambulatory Visit: Payer: Self-pay | Admitting: Family Medicine

## 2018-02-13 DIAGNOSIS — I10 Essential (primary) hypertension: Secondary | ICD-10-CM

## 2018-03-07 ENCOUNTER — Telehealth: Payer: Self-pay | Admitting: Family Medicine

## 2018-03-07 NOTE — Telephone Encounter (Signed)
ERROR

## 2018-03-15 ENCOUNTER — Encounter: Payer: Self-pay | Admitting: Family Medicine

## 2018-03-15 ENCOUNTER — Ambulatory Visit (INDEPENDENT_AMBULATORY_CARE_PROVIDER_SITE_OTHER): Payer: Medicare Other | Admitting: Family Medicine

## 2018-03-15 VITALS — BP 133/88 | HR 62 | Temp 97.6°F | Ht 65.0 in | Wt 149.4 lb

## 2018-03-15 DIAGNOSIS — B001 Herpesviral vesicular dermatitis: Secondary | ICD-10-CM | POA: Diagnosis not present

## 2018-03-15 DIAGNOSIS — Z23 Encounter for immunization: Secondary | ICD-10-CM | POA: Diagnosis not present

## 2018-03-15 DIAGNOSIS — I10 Essential (primary) hypertension: Secondary | ICD-10-CM | POA: Diagnosis not present

## 2018-03-15 DIAGNOSIS — E785 Hyperlipidemia, unspecified: Secondary | ICD-10-CM | POA: Diagnosis not present

## 2018-03-15 DIAGNOSIS — Z716 Tobacco abuse counseling: Secondary | ICD-10-CM

## 2018-03-15 DIAGNOSIS — F4323 Adjustment disorder with mixed anxiety and depressed mood: Secondary | ICD-10-CM

## 2018-03-15 DIAGNOSIS — K219 Gastro-esophageal reflux disease without esophagitis: Secondary | ICD-10-CM | POA: Diagnosis not present

## 2018-03-15 MED ORDER — TERIPARATIDE (RECOMBINANT) 600 MCG/2.4ML ~~LOC~~ SOLN: 20.0000 ug | Freq: Every day | SUBCUTANEOUS | 11 refills | Status: DC

## 2018-03-15 MED ORDER — CLOBETASOL PROPIONATE 0.05 % EX SOLN: 1.0000 "application " | Freq: Two times a day (BID) | CUTANEOUS | 3 refills | Status: DC

## 2018-03-15 MED ORDER — ALPRAZOLAM 0.5 MG PO TABS: 0.5000 mg | ORAL_TABLET | Freq: Two times a day (BID) | ORAL | 3 refills | Status: DC | PRN

## 2018-03-15 MED ORDER — CITALOPRAM HYDROBROMIDE 20 MG PO TABS: 20.0000 mg | ORAL_TABLET | Freq: Every day | ORAL | 1 refills | Status: DC

## 2018-03-15 MED ORDER — VALACYCLOVIR HCL 1 G PO TABS: 1000.0000 mg | ORAL_TABLET | Freq: Two times a day (BID) | ORAL | 3 refills | Status: DC

## 2018-03-15 NOTE — Progress Notes (Signed)
BP 133/88   Pulse 62   Temp 97.6 F (36.4 C) (Oral)   Ht _0  (1.651 m)   Wt 149 lb 6 oz (67.8 kg)   BMI 24.86 kg/m    Subjective:    Patient ID: Jody Wilson, female    DOB: 08/19/1951, 67 y.o.   MRN: [id number]  HPI: Jody Wilson is a 67 y.o. female presenting on 03/15/2018 for Hypertension; Hyperlipidemia; Tremors (ongoing for years, happens sporadically, thinks it may possibly be nerves, has been on Zoloft for 24 years); and Medication Refill (Xanax)   HPI Hyperlipidemia Patient is coming in for recheck of his hyperlipidemia. The patient is currently taking no medication currently but we are just monitoring. They deny any issues with myalgias or history of liver damage from it. They deny any focal numbness or weakness or chest pain.   Hypertension Patient is currently on hydrochlorothiazide and Benicar, and their blood pressure today is 133/88. Patient denies any lightheadedness or dizziness. Patient denies headaches, blurred vision, chest pains, shortness of breath, or weakness. Denies any side effects from medication and is content with current medication.   GERD Patient is currently on Protonix.  She denies any major symptoms or abdominal pain or belching or burping. She denies any blood in her stool or lightheadedness or dizziness.   Refill for cold sore medication, she does not currently have one  Anxiety and depression and adjustment disorder Patient is coming in for recheck on anxiety and depression and adjustment disorder.  She feels like the Zoloft is just not working for her anymore and would like to try something different.  She said the Zoloft did not work great for her and is just not currently working.  She denies any suicidal ideations or thoughts of hurting himself but she is having more anxiety and feeling more depressed than she was previously Depression screen [facility] 2/9 03/15/2018 08/06/2017 07/14/2017 04/01/2017 01/07/2017  Decreased Interest 0 0 0 0 2    Down, Depressed, Hopeless 1 0 0 0 2  PHQ - 2 Score 1 0 0 0 4  Altered sleeping - - - - -  Tired, decreased energy - - - - -  Change in appetite - - - - -  Feeling bad or failure about yourself  - - - - -  Trouble concentrating - - - - -  Moving slowly or fidgety/restless - - - - -  Suicidal thoughts - - - - -  PHQ-9 Score - - - - -     Relevant past medical, surgical, family and social history reviewed and updated as indicated. Interim medical history since our last visit reviewed. Allergies and medications reviewed and updated.  Review of Systems  Constitutional: Negative for chills and fever.  Eyes: Negative for visual disturbance.  Respiratory: Negative for chest tightness and shortness of breath.   Cardiovascular: Negative for chest pain and leg swelling.  Genitourinary: Negative for difficulty urinating and dysuria.  Musculoskeletal: Negative for back pain and gait problem.  Skin: Negative for rash.  Neurological: Negative for light-headedness and headaches.  Psychiatric/Behavioral: Positive for dysphoric mood. Negative for agitation, behavioral problems, sleep disturbance and suicidal ideas. The patient is nervous/anxious.   All other systems reviewed and are negative.   Per HPI unless specifically indicated above   Allergies as of 03/15/2018      Reactions   Tetracyclines & Related Itching, Swelling      Medication List  Accurate as of 03/15/18  9:51 AM. Always use your most recent med list.          albuterol 108 (90 Base) MCG/ACT inhaler Commonly known as:  PROVENTIL HFA;VENTOLIN HFA Inhale 2 puffs into the lungs every 6 (six) hours as needed for wheezing or shortness of breath.   ALPRAZolam 0.5 MG tablet Commonly known as:  XANAX Take 1 tablet (0.5 mg total) by mouth 2 (two) times daily as needed for anxiety.   citalopram 20 MG tablet Commonly known as:  CELEXA Take 1 tablet (20 mg total) by mouth daily.   clobetasol 0.05 % external  solution Commonly known as:  TEMOVATE Apply 1 application topically 2 (two) times daily.   ferrous sulfate 325 (65 FE) MG tablet Take 1 tablet (325 mg total) by mouth daily with breakfast.   hydrochlorothiazide 12.5 MG capsule Commonly known as:  MICROZIDE TAKE (1) CAPSULE DAILY   L-Lysine 500 MG Tabs Take 1 tablet by mouth daily.   olmesartan 40 MG tablet Commonly known as:  BENICAR TAKE 1 TABLET DAILY   pantoprazole 40 MG tablet Commonly known as:  PROTONIX Take 1 tablet (40 mg total) by mouth daily.   Teriparatide (Recombinant) 600 MCG/2.4ML Soln Inject 0.08 mLs (20 mcg total) into the skin daily.   tiZANidine 4 MG capsule Commonly known as:  ZANAFLEX Take 4 mg by mouth 3 (three) times daily.   valACYclovir 1000 MG tablet Commonly known as:  VALTREX Take 1 tablet (1,000 mg total) by mouth 2 (two) times daily.          Objective:    BP 133/88   Pulse 62   Temp 97.6 F (36.4 C) (Oral)   Ht _0  (1.651 m)   Wt 149 lb 6 oz (67.8 kg)   BMI 24.86 kg/m   Wt Readings from Last 3 Encounters:  03/15/18 149 lb 6 oz (67.8 kg)  08/06/17 147 lb 2 oz (66.7 kg)  07/14/17 149 lb (67.6 kg)    Physical Exam  Constitutional: She is oriented to person, place, and time. She appears well-developed and well-nourished. No distress.  Eyes: Conjunctivae are normal.  Neck: Neck supple. No thyromegaly present.  Cardiovascular: Normal rate, regular rhythm, normal heart sounds and intact distal pulses.  No murmur heard. Pulmonary/Chest: Effort normal and breath sounds normal. No respiratory distress. She has no wheezes. She has no rales.  Musculoskeletal: Normal range of motion. She exhibits no edema.  Lymphadenopathy:    She has no cervical adenopathy.  Neurological: She is alert and oriented to person, place, and time. Coordination normal.  Skin: Skin is warm and dry. No rash noted. She is not diaphoretic.  Psychiatric: Her behavior is normal. Her mood appears anxious. She  exhibits a depressed mood. She expresses no suicidal ideation. She expresses no suicidal plans.  Nursing note and vitals reviewed.       Assessment & Plan:   Problem List Items Addressed This Visit      Cardiovascular and Mediastinum   Hypertension - Primary   Relevant Orders   CMP14+EGFR     Digestive   GERD (gastroesophageal reflux disease)   Relevant Orders   CBC with Differential/Platelet     Other   Adjustment disorder with mixed anxiety and depressed mood   Relevant Medications   ALPRAZolam (XANAX) 0.5 MG tablet   citalopram (CELEXA) 20 MG tablet   Hyperlipidemia with target LDL less than 100   Relevant Orders   Lipid panel  Other Visit Diagnoses    Cold sore       Relevant Medications   valACYclovir (VALTREX) 1000 MG tablet   Tobacco abuse counseling           Follow up plan: Return in about 1 month (around 04/12/2018), or if symptoms worsen or fail to improve, for Anxiety recheck.  Counseling provided for all of the vaccine components Orders Placed This Encounter  Procedures  . Pneumococcal conjugate vaccine 13-valent IM  . CBC with Differential/Platelet  . CMP14+EGFR  . Lipid panel    [name], MD [facility] 03/15/2018, 9:51 AM

## 2018-03-22 ENCOUNTER — Ambulatory Visit: Payer: Medicare Other | Admitting: *Deleted

## 2018-03-29 ENCOUNTER — Ambulatory Visit: Payer: Medicare Other | Admitting: *Deleted

## 2018-03-31 ENCOUNTER — Ambulatory Visit (INDEPENDENT_AMBULATORY_CARE_PROVIDER_SITE_OTHER): Payer: Medicare Other | Admitting: Family Medicine

## 2018-03-31 ENCOUNTER — Encounter: Payer: Self-pay | Admitting: Family Medicine

## 2018-03-31 DIAGNOSIS — J441 Chronic obstructive pulmonary disease with (acute) exacerbation: Secondary | ICD-10-CM | POA: Diagnosis not present

## 2018-03-31 MED ORDER — CEFDINIR 300 MG PO CAPS: 300.0000 mg | ORAL_CAPSULE | Freq: Two times a day (BID) | ORAL | 0 refills | Status: DC

## 2018-03-31 MED ORDER — HYDROCODONE-HOMATROPINE 5-1.5 MG/5ML PO SYRP: 5.0000 mL | ORAL_SOLUTION | Freq: Four times a day (QID) | ORAL | 0 refills | Status: DC | PRN

## 2018-03-31 MED ORDER — ALBUTEROL SULFATE HFA 108 (90 BASE) MCG/ACT IN AERS
2.0000 | INHALATION_SPRAY | Freq: Four times a day (QID) | RESPIRATORY_TRACT | 2 refills | Status: DC | PRN
Start: 2018-03-31 — End: 2020-06-24

## 2018-03-31 MED ORDER — METHYLPREDNISOLONE ACETATE 80 MG/ML IJ SUSP
80.0000 mg | Freq: Once | INTRAMUSCULAR | Status: AC
  Administered 2018-03-31: 80 mg via INTRAMUSCULAR

## 2018-03-31 MED ORDER — PREDNISONE 20 MG PO TABS: ORAL_TABLET | ORAL | 0 refills | Status: DC

## 2018-03-31 NOTE — Addendum Note (Signed)
Addended by: [name] on: 03/31/2018 11:02 AM   Modules accepted: Orders

## 2018-03-31 NOTE — Progress Notes (Signed)
BP 132/84   Pulse 78   Temp 99.4 F (37.4 C) (Oral)   Ht 5\' 5"  (1.651 m)   Wt 146 lb (66.2 kg)   BMI 24.30 kg/m    Subjective:    Patient ID: Jody Wilson, female    DOB: 31-Dec-1950, 67 y.o.   MRN: [id number]  HPI: Jody Wilson is a 67 y.o. female presenting on 03/31/2018 for Nasal and chest congestion, cough, pain in back when coughin (x 2 days, out of albuterol inhaler)   HPI Cough and chest congestion and wheezing Patient comes in today complaining of cough and chest congestion and wheezing that is been going on for the past 2 days.  He does state that she quit smoking 1 week ago and this has really been bothering her over the past couple days.  She denies any fevers or chills but has felt short of breath and chest tightness going along with this.  She has been using her albuterol inhaler but she has been out over the past few days.  Relevant past medical, surgical, family and social history reviewed and updated as indicated. Interim medical history since our last visit reviewed. Allergies and medications reviewed and updated.  Review of Systems  Constitutional: Negative for chills and fever.  HENT: Positive for congestion, postnasal drip, rhinorrhea, sinus pressure, sneezing and sore throat. Negative for ear discharge and ear pain.   Eyes: Negative for pain, redness and visual disturbance.  Respiratory: Positive for cough, chest tightness, shortness of breath and wheezing.   Cardiovascular: Negative for chest pain and leg swelling.  Genitourinary: Negative for difficulty urinating and dysuria.  Musculoskeletal: Negative for back pain and gait problem.  Skin: Negative for rash.  Neurological: Negative for light-headedness and headaches.  Psychiatric/Behavioral: Negative for agitation and behavioral problems.  All other systems reviewed and are negative.   Per HPI unless specifically indicated above   Allergies as of 03/31/2018      Reactions   Tetracyclines &  Related Itching, Swelling      Medication List        Accurate as of 03/31/18 10:41 AM. Always use your most recent med list.          albuterol 108 (90 Base) MCG/ACT inhaler Commonly known as:  PROVENTIL HFA;VENTOLIN HFA Inhale 2 puffs into the lungs every 6 (six) hours as needed for wheezing or shortness of breath.   ALPRAZolam 0.5 MG tablet Commonly known as:  XANAX Take 1 tablet (0.5 mg total) by mouth 2 (two) times daily as needed for anxiety.   cefdinir 300 MG capsule Commonly known as:  OMNICEF Take 1 capsule (300 mg total) by mouth 2 (two) times daily. 1 po BID   citalopram 20 MG tablet Commonly known as:  CELEXA Take 1 tablet (20 mg total) by mouth daily.   clobetasol 0.05 % external solution Commonly known as:  TEMOVATE Apply 1 application topically 2 (two) times daily.   ferrous sulfate 325 (65 FE) MG tablet Take 1 tablet (325 mg total) by mouth daily with breakfast.   hydrochlorothiazide 12.5 MG capsule Commonly known as:  MICROZIDE TAKE (1) CAPSULE DAILY   L-Lysine 500 MG Tabs Take 1 tablet by mouth daily.   olmesartan 40 MG tablet Commonly known as:  BENICAR TAKE 1 TABLET DAILY   pantoprazole 40 MG tablet Commonly known as:  PROTONIX Take 1 tablet (40 mg total) by mouth daily.   predniSONE 20 MG tablet Commonly known as:  DELTASONE 2  po at same time daily for 5 days   tiZANidine 4 MG capsule Commonly known as:  ZANAFLEX Take 4 mg by mouth 3 (three) times daily.   valACYclovir 1000 MG tablet Commonly known as:  VALTREX Take 1 tablet (1,000 mg total) by mouth 2 (two) times daily.          Objective:    BP 132/84   Pulse 78   Temp 99.4 F (37.4 C) (Oral)   Ht 5\' 5"  (1.651 m)   Wt 146 lb (66.2 kg)   BMI 24.30 kg/m   Wt Readings from Last 3 Encounters:  03/31/18 146 lb (66.2 kg)  03/15/18 149 lb 6 oz (67.8 kg)  08/06/17 147 lb 2 oz (66.7 kg)    Physical Exam  Constitutional: She is oriented to person, place, and time. She  appears well-developed and well-nourished. No distress.  HENT:  Right Ear: Tympanic membrane, external ear and ear canal normal.  Left Ear: Tympanic membrane, external ear and ear canal normal.  Nose: Mucosal edema and rhinorrhea present. No epistaxis. Right sinus exhibits no maxillary sinus tenderness and no frontal sinus tenderness. Left sinus exhibits no maxillary sinus tenderness and no frontal sinus tenderness.  Mouth/Throat: Uvula is midline and mucous membranes are normal. Posterior oropharyngeal edema and posterior oropharyngeal erythema present. No oropharyngeal exudate or tonsillar abscesses.  Eyes: Conjunctivae and EOM are normal.  Cardiovascular: Normal rate, regular rhythm, normal heart sounds and intact distal pulses.  No murmur heard. Pulmonary/Chest: Effort normal. No respiratory distress. She has wheezes.  Musculoskeletal: Normal range of motion. She exhibits no edema or tenderness.  Neurological: She is alert and oriented to person, place, and time. Coordination normal.  Skin: Skin is warm and dry. No rash noted. She is not diaphoretic.  Psychiatric: She has a normal mood and affect. Her behavior is normal.  Vitals reviewed.       Assessment & Plan:   Problem List Items Addressed This Visit      Respiratory   COPD exacerbation ([place])   Relevant Medications   methylPREDNISolone acetate (DEPO-MEDROL) injection 80 mg (Start on 03/31/2018 10:45 AM)   cefdinir (OMNICEF) 300 MG capsule   predniSONE (DELTASONE) 20 MG tablet   albuterol (PROVENTIL HFA;VENTOLIN HFA) 108 (90 Base) MCG/ACT inhaler       Follow up plan: Return if symptoms worsen or fail to improve.  Counseling provided for all of the vaccine components No orders of the defined types were placed in this encounter.   [name], MD [facility] 03/31/2018, 10:41 AM

## 2018-04-03 ENCOUNTER — Ambulatory Visit: Payer: Medicare Other | Admitting: *Deleted

## 2018-04-14 ENCOUNTER — Other Ambulatory Visit: Payer: Self-pay | Admitting: Family Medicine

## 2018-04-14 DIAGNOSIS — I10 Essential (primary) hypertension: Secondary | ICD-10-CM

## 2018-04-20 ENCOUNTER — Ambulatory Visit: Payer: Medicare Other | Admitting: Family Medicine

## 2018-05-04 DIAGNOSIS — R601 Generalized edema: Secondary | ICD-10-CM | POA: Diagnosis not present

## 2018-05-04 DIAGNOSIS — M79672 Pain in left foot: Secondary | ICD-10-CM | POA: Diagnosis not present

## 2018-05-04 DIAGNOSIS — S92315A Nondisplaced fracture of first metatarsal bone, left foot, initial encounter for closed fracture: Secondary | ICD-10-CM | POA: Diagnosis not present

## 2018-05-11 DIAGNOSIS — S92315D Nondisplaced fracture of first metatarsal bone, left foot, subsequent encounter for fracture with routine healing: Secondary | ICD-10-CM | POA: Diagnosis not present

## 2018-05-17 DIAGNOSIS — S93325A Dislocation of tarsometatarsal joint of left foot, initial encounter: Secondary | ICD-10-CM | POA: Diagnosis not present

## 2018-05-17 DIAGNOSIS — S92312A Displaced fracture of first metatarsal bone, left foot, initial encounter for closed fracture: Secondary | ICD-10-CM | POA: Diagnosis not present

## 2018-05-17 DIAGNOSIS — M7742 Metatarsalgia, left foot: Secondary | ICD-10-CM | POA: Diagnosis not present

## 2018-05-18 ENCOUNTER — Encounter: Payer: Self-pay | Admitting: Family Medicine

## 2018-05-18 ENCOUNTER — Ambulatory Visit (INDEPENDENT_AMBULATORY_CARE_PROVIDER_SITE_OTHER): Payer: Medicare Other | Admitting: Family Medicine

## 2018-05-18 VITALS — BP 135/80 | HR 61 | Temp 98.3°F | Ht 65.0 in | Wt 140.0 lb

## 2018-05-18 DIAGNOSIS — R6889 Other general symptoms and signs: Secondary | ICD-10-CM | POA: Diagnosis not present

## 2018-05-18 DIAGNOSIS — K219 Gastro-esophageal reflux disease without esophagitis: Secondary | ICD-10-CM | POA: Diagnosis not present

## 2018-05-18 DIAGNOSIS — D6489 Other specified anemias: Secondary | ICD-10-CM | POA: Diagnosis not present

## 2018-05-18 DIAGNOSIS — B001 Herpesviral vesicular dermatitis: Secondary | ICD-10-CM

## 2018-05-18 DIAGNOSIS — E785 Hyperlipidemia, unspecified: Secondary | ICD-10-CM | POA: Diagnosis not present

## 2018-05-18 DIAGNOSIS — R799 Abnormal finding of blood chemistry, unspecified: Secondary | ICD-10-CM | POA: Diagnosis not present

## 2018-05-18 DIAGNOSIS — I10 Essential (primary) hypertension: Secondary | ICD-10-CM | POA: Diagnosis not present

## 2018-05-18 DIAGNOSIS — Z1159 Encounter for screening for other viral diseases: Secondary | ICD-10-CM

## 2018-05-18 DIAGNOSIS — Z13220 Encounter for screening for lipoid disorders: Secondary | ICD-10-CM

## 2018-05-18 DIAGNOSIS — F4323 Adjustment disorder with mixed anxiety and depressed mood: Secondary | ICD-10-CM

## 2018-05-18 DIAGNOSIS — D649 Anemia, unspecified: Secondary | ICD-10-CM | POA: Insufficient documentation

## 2018-05-18 MED ORDER — PANTOPRAZOLE SODIUM 40 MG PO TBEC: 40.0000 mg | DELAYED_RELEASE_TABLET | Freq: Every day | ORAL | 6 refills | Status: DC

## 2018-05-18 MED ORDER — VALACYCLOVIR HCL 1 G PO TABS: 1000.0000 mg | ORAL_TABLET | Freq: Two times a day (BID) | ORAL | 3 refills | Status: DC

## 2018-05-18 MED ORDER — CITALOPRAM HYDROBROMIDE 40 MG PO TABS
40.0000 mg | ORAL_TABLET | Freq: Every day | ORAL | 6 refills | Status: DC
Start: 2018-05-18 — End: 2018-11-24

## 2018-05-18 NOTE — Progress Notes (Signed)
BP 135/80   Pulse 61   Temp 98.3 F (36.8 C) (Oral)   Ht 5' 5"  (1.651 m)   Wt 140 lb (63.5 kg)   BMI 23.30 kg/m    Subjective:    Patient ID: Jody Wilson, female    DOB: 03-Oct-1951, 67 y.o.   MRN: [id number]  HPI: Jody Wilson is a 67 y.o. female presenting on 05/18/2018 for One month followup since starting Celexa   HPI Adjustment disorder and depression recheck Patient is coming in for adjustment disorder and depression recheck today.  She has been taking Celexa 20 mg which was a change from her previous Zoloft and feels like it is helping but may be just not enough.  She denies any major side effects from it and would like to try and increase it.  She denies any suicidal ideations or thoughts of hurting herself.  She is a little down emotionally right now because she just fractured her foot but other than that she feels like she is doing very well. Depression screen [facility] 2/9 05/18/2018 03/31/2018 03/15/2018 08/06/2017 07/14/2017  Decreased Interest 0 0 0 0 0  Down, Depressed, Hopeless 0 0 1 0 0  PHQ - 2 Score 0 0 1 0 0  Altered sleeping - - - - -  Tired, decreased energy - - - - -  Change in appetite - - - - -  Feeling bad or failure about yourself  - - - - -  Trouble concentrating - - - - -  Moving slowly or fidgety/restless - - - - -  Suicidal thoughts - - - - -  PHQ-9 Score - - - - -    GERD Patient is currently on Protonix.  She denies any major symptoms or abdominal pain or belching or burping. She denies any blood in her stool or lightheadedness or dizziness.   Patient had anemia since her last surgery last year, we will recheck it today.  Her hemoglobin then was 9.6.  She denies any lightheadedness or dizziness or issues with it.  Patient wants a refill for medications for her cold sores  Relevant past medical, surgical, family and social history reviewed and updated as indicated. Interim medical history since our last visit reviewed. Allergies and medications  reviewed and updated.  Review of Systems  Constitutional: Negative for chills and fever.  Eyes: Negative for visual disturbance.  Respiratory: Negative for chest tightness and shortness of breath.   Cardiovascular: Negative for chest pain and leg swelling.  Musculoskeletal: Negative for back pain and gait problem.  Skin: Negative for rash.  Neurological: Negative for light-headedness and headaches.  Psychiatric/Behavioral: Positive for dysphoric mood. Negative for agitation, behavioral problems, self-injury, sleep disturbance and suicidal ideas. The patient is nervous/anxious.   All other systems reviewed and are negative.   Per HPI unless specifically indicated above   Allergies as of 05/18/2018      Reactions   Tetracyclines & Related Itching, Swelling      Medication List        Accurate as of 05/18/18  8:44 AM. Always use your most recent med list.          albuterol 108 (90 Base) MCG/ACT inhaler Commonly known as:  PROVENTIL HFA;VENTOLIN HFA Inhale 2 puffs into the lungs every 6 (six) hours as needed for wheezing or shortness of breath.   ALPRAZolam 0.5 MG tablet Commonly known as:  XANAX Take 1 tablet (0.5 mg total) by mouth 2 (two) times  daily as needed for anxiety.   cefdinir 300 MG capsule Commonly known as:  OMNICEF Take 1 capsule (300 mg total) by mouth 2 (two) times daily. 1 po BID   citalopram 40 MG tablet Commonly known as:  CELEXA Take 1 tablet (40 mg total) by mouth daily.   clobetasol 0.05 % external solution Commonly known as:  TEMOVATE Apply 1 application topically 2 (two) times daily.   hydrochlorothiazide 12.5 MG capsule Commonly known as:  MICROZIDE TAKE (1) CAPSULE DAILY   L-Lysine 500 MG Tabs Take 1 tablet by mouth daily.   olmesartan 40 MG tablet Commonly known as:  BENICAR TAKE 1 TABLET DAILY   pantoprazole 40 MG tablet Commonly known as:  PROTONIX Take 1 tablet (40 mg total) by mouth daily.   predniSONE 20 MG tablet Commonly  known as:  DELTASONE 2 po at same time daily for 5 days   tiZANidine 4 MG capsule Commonly known as:  ZANAFLEX Take 4 mg by mouth 3 (three) times daily.   valACYclovir 1000 MG tablet Commonly known as:  VALTREX Take 1 tablet (1,000 mg total) by mouth 2 (two) times daily.          Objective:    BP 135/80   Pulse 61   Temp 98.3 F (36.8 C) (Oral)   Ht 5' 5"  (1.651 m)   Wt 140 lb (63.5 kg)   BMI 23.30 kg/m   Wt Readings from Last 3 Encounters:  05/18/18 140 lb (63.5 kg)  03/31/18 146 lb (66.2 kg)  03/15/18 149 lb 6 oz (67.8 kg)    Physical Exam  Constitutional: She is oriented to person, place, and time. She appears well-developed and well-nourished. No distress.  Eyes: Conjunctivae are normal.  Cardiovascular: Normal rate, regular rhythm, normal heart sounds and intact distal pulses.  No murmur heard. Pulmonary/Chest: Effort normal and breath sounds normal. No respiratory distress. She has no wheezes.  Musculoskeletal: Normal range of motion. She exhibits no edema.  Neurological: She is alert and oriented to person, place, and time. Coordination normal.  Skin: Skin is warm and dry. No rash noted. She is not diaphoretic.  Psychiatric: She has a normal mood and affect. Her behavior is normal.  Nursing note and vitals reviewed.      Assessment & Plan:   Problem List Items Addressed This Visit      Digestive   GERD (gastroesophageal reflux disease)   Relevant Medications   pantoprazole (PROTONIX) 40 MG tablet   Other Relevant Orders   Anemia Profile B   CMP14+EGFR     Other   Adjustment disorder with mixed anxiety and depressed mood - Primary   Relevant Medications   citalopram (CELEXA) 40 MG tablet   Anemia   Relevant Orders   Anemia Profile B    Other Visit Diagnoses    Cold sore       Relevant Medications   valACYclovir (VALTREX) 1000 MG tablet   Lipid screening       Relevant Orders   Lipid panel   Need for hepatitis C screening test        Relevant Orders   Hepatitis C antibody       Follow up plan: Return in about 6 months (around 11/18/2018), or if symptoms worsen or fail to improve, for Recheck anxiety and anemia and GERD.  Counseling provided for all of the vaccine components Orders Placed This Encounter  Procedures  . Anemia Profile B  . Lipid panel  . CMP14+EGFR  .  Hepatitis C antibody    [name], MD [facility] 05/18/2018, 8:44 AM

## 2018-05-19 LAB — ANEMIA PROFILE B
Basophils Absolute: 0 10*3/uL (ref 0.0–0.2)
Basos: 0 %
EOS (ABSOLUTE): 0.2 10*3/uL (ref 0.0–0.4)
Eos: 3 %
Ferritin: 24 ng/mL (ref 15–150)
Folate: 7.4 ng/mL (ref >3.0)
Hematocrit: 45.1 % (ref 34.0–46.6)
Hemoglobin: 14.7 g/dL (ref 11.1–15.9)
Immature Grans (Abs): 0 10*3/uL (ref 0.0–0.1)
Immature Granulocytes: 0 %
Iron Saturation: 37 % (ref 15–55)
Iron: 131 ug/dL (ref 27–139)
Lymphocytes Absolute: 2.1 10*3/uL (ref 0.7–3.1)
Lymphs: 35 %
MCH: 33.1 pg — ABNORMAL HIGH (ref 26.6–33.0)
MCHC: 32.6 g/dL (ref 31.5–35.7)
MCV: 102 fL — ABNORMAL HIGH (ref 79–97)
Monocytes Absolute: 0.5 10*3/uL (ref 0.1–0.9)
Monocytes: 8 %
Neutrophils Absolute: 3.2 10*3/uL (ref 1.4–7.0)
Neutrophils: 54 %
Platelets: 307 10*3/uL (ref 150–450)
RBC: 4.44 x10E6/uL (ref 3.77–5.28)
RDW: 15 % (ref 12.3–15.4)
Retic Ct Pct: 1.4 % (ref 0.6–2.6)
Total Iron Binding Capacity: 350 ug/dL (ref 250–450)
UIBC: 219 ug/dL (ref 118–369)
Vitamin B-12: 225 pg/mL — ABNORMAL LOW (ref 232–1245)
WBC: 5.9 10*3/uL (ref 3.4–10.8)

## 2018-05-19 LAB — HEPATITIS C ANTIBODY: Hep C Virus Ab: <0.1 s/co ratio (ref 0.0–0.9)

## 2018-05-19 LAB — LIPID PANEL
Chol/HDL Ratio: 2.9 ratio (ref 0.0–4.4)
Cholesterol, Total: 203 mg/dL — ABNORMAL HIGH (ref 100–199)
HDL: 70 mg/dL (ref >39)
LDL Calculated: 110 mg/dL — ABNORMAL HIGH (ref 0–99)
Triglycerides: 115 mg/dL (ref 0–149)
VLDL Cholesterol Cal: 23 mg/dL (ref 5–40)

## 2018-05-19 LAB — CMP14+EGFR
ALT: 22 IU/L (ref 0–32)
AST: 18 IU/L (ref 0–40)
Albumin/Globulin Ratio: 1.7 (ref 1.2–2.2)
Albumin: 4.2 g/dL (ref 3.6–4.8)
Alkaline Phosphatase: 103 IU/L (ref 39–117)
BUN/Creatinine Ratio: 27 (ref 12–28)
BUN: 21 mg/dL (ref 8–27)
Bilirubin Total: 0.6 mg/dL (ref 0.0–1.2)
CO2: 25 mmol/L (ref 20–29)
Calcium: 9.7 mg/dL (ref 8.7–10.3)
Chloride: 99 mmol/L (ref 96–106)
Creatinine, Ser: 0.78 mg/dL (ref 0.57–1.00)
GFR calc Af Amer: 92 mL/min/{1.73_m2} (ref >59)
GFR calc non Af Amer: 79 mL/min/{1.73_m2} (ref >59)
Globulin, Total: 2.5 g/dL (ref 1.5–4.5)
Glucose: 90 mg/dL (ref 65–99)
Potassium: 4.5 mmol/L (ref 3.5–5.2)
Sodium: 139 mmol/L (ref 134–144)
Total Protein: 6.7 g/dL (ref 6.0–8.5)

## 2018-05-22 DIAGNOSIS — S93325A Dislocation of tarsometatarsal joint of left foot, initial encounter: Secondary | ICD-10-CM | POA: Diagnosis not present

## 2018-05-22 DIAGNOSIS — S92315A Nondisplaced fracture of first metatarsal bone, left foot, initial encounter for closed fracture: Secondary | ICD-10-CM | POA: Diagnosis not present

## 2018-05-23 DIAGNOSIS — S92315D Nondisplaced fracture of first metatarsal bone, left foot, subsequent encounter for fracture with routine healing: Secondary | ICD-10-CM | POA: Diagnosis not present

## 2018-05-23 DIAGNOSIS — M810 Age-related osteoporosis without current pathological fracture: Secondary | ICD-10-CM | POA: Diagnosis not present

## 2018-06-17 ENCOUNTER — Other Ambulatory Visit: Payer: Self-pay | Admitting: Family Medicine

## 2018-06-17 DIAGNOSIS — I10 Essential (primary) hypertension: Secondary | ICD-10-CM

## 2018-06-26 DIAGNOSIS — S92315D Nondisplaced fracture of first metatarsal bone, left foot, subsequent encounter for fracture with routine healing: Secondary | ICD-10-CM | POA: Diagnosis not present

## 2018-07-19 DIAGNOSIS — S92315D Nondisplaced fracture of first metatarsal bone, left foot, subsequent encounter for fracture with routine healing: Secondary | ICD-10-CM | POA: Diagnosis not present

## 2018-07-26 ENCOUNTER — Telehealth: Payer: Self-pay | Admitting: Family Medicine

## 2018-07-26 ENCOUNTER — Ambulatory Visit (INDEPENDENT_AMBULATORY_CARE_PROVIDER_SITE_OTHER): Payer: Medicare Other | Admitting: Family Medicine

## 2018-07-26 ENCOUNTER — Telehealth: Payer: Self-pay

## 2018-07-26 ENCOUNTER — Encounter: Payer: Self-pay | Admitting: Family Medicine

## 2018-07-26 ENCOUNTER — Ambulatory Visit (INDEPENDENT_AMBULATORY_CARE_PROVIDER_SITE_OTHER): Payer: Medicare Other

## 2018-07-26 VITALS — BP 123/73 | HR 66 | Temp 97.6°F | Ht 65.0 in | Wt 147.0 lb

## 2018-07-26 DIAGNOSIS — M8000XD Age-related osteoporosis with current pathological fracture, unspecified site, subsequent encounter for fracture with routine healing: Secondary | ICD-10-CM

## 2018-07-26 DIAGNOSIS — M25521 Pain in right elbow: Secondary | ICD-10-CM

## 2018-07-26 DIAGNOSIS — W19XXXA Unspecified fall, initial encounter: Secondary | ICD-10-CM

## 2018-07-26 DIAGNOSIS — M79601 Pain in right arm: Secondary | ICD-10-CM | POA: Diagnosis not present

## 2018-07-26 DIAGNOSIS — M25531 Pain in right wrist: Secondary | ICD-10-CM

## 2018-07-26 DIAGNOSIS — F4323 Adjustment disorder with mixed anxiety and depressed mood: Secondary | ICD-10-CM | POA: Diagnosis not present

## 2018-07-26 DIAGNOSIS — S60211A Contusion of right wrist, initial encounter: Secondary | ICD-10-CM | POA: Diagnosis not present

## 2018-07-26 DIAGNOSIS — S52124A Nondisplaced fracture of head of right radius, initial encounter for closed fracture: Secondary | ICD-10-CM

## 2018-07-26 DIAGNOSIS — S59901A Unspecified injury of right elbow, initial encounter: Secondary | ICD-10-CM | POA: Diagnosis not present

## 2018-07-26 MED ORDER — DENOSUMAB 60 MG/ML ~~LOC~~ SOSY: 60.0000 mg | PREFILLED_SYRINGE | SUBCUTANEOUS | 0 refills | Status: DC

## 2018-07-26 MED ORDER — TIZANIDINE HCL 4 MG PO CAPS: 4.0000 mg | ORAL_CAPSULE | Freq: Three times a day (TID) | ORAL | 2 refills | Status: DC

## 2018-07-26 MED ORDER — ALPRAZOLAM 0.5 MG PO TABS: 0.5000 mg | ORAL_TABLET | Freq: Two times a day (BID) | ORAL | 3 refills | Status: DC | PRN

## 2018-07-26 NOTE — Telephone Encounter (Signed)
Addendum by radiologist was read as possible fracture, instructed patient to get a sling and did orthopedic referral.

## 2018-07-26 NOTE — Telephone Encounter (Signed)
FYI- Starting patient on Prollia.

## 2018-07-26 NOTE — Progress Notes (Signed)
BP 123/73   Pulse 66   Temp 97.6 F (36.4 C) (Oral)   Ht 5\' 5"  (1.651 m)   Wt 147 lb (66.7 kg)   BMI 24.46 kg/m    Subjective:    Patient ID: Jody Wilson, female    DOB: 1951-05-07, 67 y.o.   MRN: [id number]  HPI: Jody Wilson is a 67 y.o. female presenting on 07/26/2018 for Arm Pain (Patient states she had a fall Monday and is c/o right arm pain- wrist to elbow ) and Anxiety (requesting refill of xanax)   HPI Fall and right arm Patient comes in complaining of a fall and landed on her right wrist and elbow, she is having significant pain in her right forearm and swelling there as well.  She said she fell 2 days ago and it has continued to hurt and swell more over the past couple days.  She denies any fevers or chills or shortness of breath or wheezing.  She has had the diagnosis of osteoporosis and has had fractures related to that previously and that is what concerned her most today.  She denies any loss of range of motion or loss of grip strength or sensation but just has a lot of swelling and pain  Anxiety and depression and mood disorder recheck Patient is currently on Celexa and uses alprazolam as needed.  She is coming in today for refill of alprazolam.  She says she is doing well about the Lexa and alprazolam.  She is a little bit down from where she is because she is dealing with some health issues but aside from that she is doing well. Depression screen [facility] 2/9 07/26/2018 05/18/2018 03/31/2018 03/15/2018 08/06/2017  Decreased Interest 1 0 0 0 0  Down, Depressed, Hopeless 2 0 0 1 0  PHQ - 2 Score 3 0 0 1 0  Altered sleeping 3 - - - -  Tired, decreased energy 3 - - - -  Change in appetite 3 - - - -  Feeling bad or failure about yourself  1 - - - -  Trouble concentrating 0 - - - -  Moving slowly or fidgety/restless 0 - - - -  Suicidal thoughts 0 - - - -  PHQ-9 Score 13 - - - -     Relevant past medical, surgical, family and social history reviewed and updated as  indicated. Interim medical history since our last visit reviewed. Allergies and medications reviewed and updated.  Review of Systems  Constitutional: Negative for chills and fever.  Eyes: Negative for visual disturbance.  Respiratory: Negative for chest tightness and shortness of breath.   Cardiovascular: Negative for chest pain and leg swelling.  Musculoskeletal: Positive for arthralgias and joint swelling. Negative for back pain and gait problem.  Skin: Negative for rash.  Neurological: Negative for light-headedness and headaches.  Psychiatric/Behavioral: Positive for dysphoric mood. Negative for agitation, behavioral problems, self-injury, sleep disturbance and suicidal ideas. The patient is nervous/anxious.   All other systems reviewed and are negative.   Per HPI unless specifically indicated above   Allergies as of 07/26/2018      Reactions   Tetracyclines & Related Itching, Swelling      Medication List        Accurate as of 07/26/18  1:31 PM. Always use your most recent med list.          albuterol 108 (90 Base) MCG/ACT inhaler Commonly known as:  PROVENTIL HFA;VENTOLIN HFA Inhale 2 puffs into  the lungs every 6 (six) hours as needed for wheezing or shortness of breath.   ALPRAZolam 0.5 MG tablet Commonly known as:  XANAX Take 1 tablet (0.5 mg total) by mouth 2 (two) times daily as needed for anxiety.   calcium-vitamin D 500-200 MG-UNIT tablet Commonly known as:  OSCAL WITH D Take 1 tablet by mouth.   citalopram 40 MG tablet Commonly known as:  CELEXA Take 1 tablet (40 mg total) by mouth daily.   clobetasol 0.05 % external solution Commonly known as:  TEMOVATE Apply 1 application topically 2 (two) times daily.   denosumab 60 MG/ML Sosy injection Commonly known as:  PROLIA Inject 60 mg into the skin every 6 (six) months.   hydrochlorothiazide 12.5 MG capsule Commonly known as:  MICROZIDE TAKE (1) CAPSULE DAILY   L-Lysine 500 MG Tabs Take 1 tablet by mouth  daily.   olmesartan 40 MG tablet Commonly known as:  BENICAR TAKE 1 TABLET DAILY   pantoprazole 40 MG tablet Commonly known as:  PROTONIX Take 1 tablet (40 mg total) by mouth daily.   tiZANidine 4 MG capsule Commonly known as:  ZANAFLEX Take 4 mg by mouth 3 (three) times daily.   valACYclovir 1000 MG tablet Commonly known as:  VALTREX Take 1 tablet (1,000 mg total) by mouth 2 (two) times daily.          Objective:    BP 123/73   Pulse 66   Temp 97.6 F (36.4 C) (Oral)   Ht 5\' 5"  (1.651 m)   Wt 147 lb (66.7 kg)   BMI 24.46 kg/m   Wt Readings from Last 3 Encounters:  07/26/18 147 lb (66.7 kg)  05/18/18 140 lb (63.5 kg)  03/31/18 146 lb (66.2 kg)    Physical Exam  Constitutional: She is oriented to person, place, and time. She appears well-developed and well-nourished. No distress.  Eyes: Conjunctivae are normal.  Neck: Neck supple. No thyromegaly present.  Cardiovascular: Normal rate, regular rhythm, normal heart sounds and intact distal pulses.  No murmur heard. Pulmonary/Chest: Effort normal and breath sounds normal. No respiratory distress. She has no wheezes.  Musculoskeletal: Normal range of motion. She exhibits no edema.       Right elbow: She exhibits swelling. She exhibits normal range of motion and no effusion. Tenderness found. Radial head tenderness noted.  Lymphadenopathy:    She has no cervical adenopathy.  Neurological: She is alert and oriented to person, place, and time. Coordination normal.  Skin: Skin is warm and dry. No rash noted. She is not diaphoretic.  Psychiatric: She has a normal mood and affect. Her behavior is normal.  Nursing note and vitals reviewed.   Radiologist addended x-ray that showed possible radial head fracture, will place patient in a sling and refer to orthopedic    Assessment & Plan:   Problem List Items Addressed This Visit      Musculoskeletal and Integument   Osteoporosis   Relevant Medications   calcium-vitamin  D (OSCAL WITH D) 500-200 MG-UNIT tablet   denosumab (PROLIA) 60 MG/ML SOSY injection     Other   Adjustment disorder with mixed anxiety and depressed mood   Relevant Medications   ALPRAZolam (XANAX) 0.5 MG tablet    Other Visit Diagnoses    Fall, initial encounter    -  Primary   Relevant Orders   DG Elbow 2 Views Right (Completed)   DG Wrist Complete Right (Completed)       Follow up plan: Return  in about 3 months (around 10/26/2018), or if symptoms worsen or fail to improve, for Anxiety recheck.  Counseling provided for all of the vaccine components Orders Placed This Encounter  Procedures  . DG Elbow 2 Views Right  . DG Wrist Complete Right    [name], MD [facility] 07/26/2018, 1:31 PM

## 2018-07-26 NOTE — Telephone Encounter (Signed)
Patient aware and has came in for sling

## 2018-07-27 ENCOUNTER — Telehealth: Payer: Self-pay | Admitting: *Deleted

## 2018-07-27 NOTE — Telephone Encounter (Signed)
prolia was sent in to pharmacy before insurance verification was completed.  The cost to the patient at the pharmacy was $1040.  Insurance verification done today and received instant reply.  No cost to patient if purchased as "buy and bill" and administered in the office.  Patient notified. She has a current fracture and is seeing ortho on Tuesday. She will see what they recommend as far as timeframe for starting Prolia and give [place] a call back.

## 2018-07-27 NOTE — Telephone Encounter (Signed)
New encounter started with Prolia information.

## 2018-07-31 DIAGNOSIS — M25521 Pain in right elbow: Secondary | ICD-10-CM | POA: Diagnosis not present

## 2018-07-31 DIAGNOSIS — Z720 Tobacco use: Secondary | ICD-10-CM | POA: Diagnosis not present

## 2018-08-01 NOTE — Telephone Encounter (Signed)
Patient saw ortho today and does not have a fracture. She is ok to go ahead and start prolia. Scheduled her for tomorrow morning and put her name on one of the stock boxes.

## 2018-08-02 ENCOUNTER — Ambulatory Visit (INDEPENDENT_AMBULATORY_CARE_PROVIDER_SITE_OTHER): Payer: Medicare Other | Admitting: *Deleted

## 2018-08-02 ENCOUNTER — Other Ambulatory Visit: Payer: Self-pay | Admitting: Family Medicine

## 2018-08-02 DIAGNOSIS — M8000XD Age-related osteoporosis with current pathological fracture, unspecified site, subsequent encounter for fracture with routine healing: Secondary | ICD-10-CM

## 2018-08-02 MED ORDER — HYDROCODONE-ACETAMINOPHEN 5-325 MG PO TABS: 1.0000 | ORAL_TABLET | Freq: Four times a day (QID) | ORAL | 0 refills | Status: DC | PRN

## 2018-08-02 MED ORDER — DENOSUMAB 60 MG/ML ~~LOC~~ SOSY
60.0000 mg | PREFILLED_SYRINGE | Freq: Once | SUBCUTANEOUS | Status: AC
Start: 2018-08-02 — End: 2018-08-02
  Administered 2018-08-02: 60 mg via SUBCUTANEOUS

## 2018-08-02 NOTE — Progress Notes (Signed)
Pt given Prolia inj Tolerated well Pt had recent fracture Cleared by Dr Creig Hines to get inj Buy and bill

## 2018-08-02 NOTE — Progress Notes (Signed)
Attempted to resend [employer]

## 2018-08-02 NOTE — Progress Notes (Signed)
Sent in a little bit of pain management for her right arm pain

## 2018-08-02 NOTE — Patient Instructions (Signed)

## 2018-08-29 ENCOUNTER — Other Ambulatory Visit: Payer: Self-pay | Admitting: Family Medicine

## 2018-08-29 DIAGNOSIS — I10 Essential (primary) hypertension: Secondary | ICD-10-CM

## 2018-09-25 DIAGNOSIS — Z01419 Encounter for gynecological examination (general) (routine) without abnormal findings: Secondary | ICD-10-CM | POA: Diagnosis not present

## 2018-09-25 DIAGNOSIS — Z6825 Body mass index (BMI) 25.0-25.9, adult: Secondary | ICD-10-CM | POA: Diagnosis not present

## 2018-09-25 DIAGNOSIS — Z1231 Encounter for screening mammogram for malignant neoplasm of breast: Secondary | ICD-10-CM | POA: Diagnosis not present

## 2018-10-03 ENCOUNTER — Other Ambulatory Visit (HOSPITAL_COMMUNITY): Payer: Self-pay | Admitting: Obstetrics and Gynecology

## 2018-10-03 DIAGNOSIS — Z87891 Personal history of nicotine dependence: Secondary | ICD-10-CM

## 2018-10-04 ENCOUNTER — Other Ambulatory Visit (HOSPITAL_COMMUNITY): Payer: Self-pay | Admitting: Obstetrics and Gynecology

## 2018-10-04 DIAGNOSIS — F17210 Nicotine dependence, cigarettes, uncomplicated: Secondary | ICD-10-CM

## 2018-10-04 DIAGNOSIS — F17200 Nicotine dependence, unspecified, uncomplicated: Secondary | ICD-10-CM

## 2018-10-24 ENCOUNTER — Other Ambulatory Visit: Payer: Self-pay | Admitting: Family Medicine

## 2018-10-24 DIAGNOSIS — I10 Essential (primary) hypertension: Secondary | ICD-10-CM

## 2018-11-21 ENCOUNTER — Other Ambulatory Visit: Payer: Self-pay | Admitting: Family Medicine

## 2018-11-21 DIAGNOSIS — I10 Essential (primary) hypertension: Secondary | ICD-10-CM

## 2018-11-22 MED ORDER — HYDROCHLOROTHIAZIDE 12.5 MG PO CAPS: 12.5000 mg | ORAL_CAPSULE | Freq: Every day | ORAL | 0 refills | Status: DC

## 2018-11-22 NOTE — Telephone Encounter (Signed)
Dettinger. NTBS. 30 days given 10/25/18 

## 2018-11-22 NOTE — Addendum Note (Signed)
Addended by: Marylin Crosby on: 11/22/2018 10:28 AM   Modules accepted: Orders

## 2018-11-22 NOTE — Telephone Encounter (Signed)
Pt scheduled appt 11/24/18 with Dr Warrick Parisian and will run out of medicine tomorrow. Rx for 30 days sent to pharmacy and advised pt she would get further refills at appt on Friday with Dr Dettinger.

## 2018-11-24 ENCOUNTER — Telehealth: Payer: Self-pay | Admitting: Family Medicine

## 2018-11-24 ENCOUNTER — Ambulatory Visit: Payer: Medicare Other

## 2018-11-24 ENCOUNTER — Ambulatory Visit (INDEPENDENT_AMBULATORY_CARE_PROVIDER_SITE_OTHER): Payer: Medicare Other | Admitting: Family Medicine

## 2018-11-24 ENCOUNTER — Encounter: Payer: Self-pay | Admitting: Family Medicine

## 2018-11-24 VITALS — BP 124/73 | HR 61 | Temp 97.1°F | Ht 65.0 in | Wt 145.4 lb

## 2018-11-24 DIAGNOSIS — F4323 Adjustment disorder with mixed anxiety and depressed mood: Secondary | ICD-10-CM | POA: Diagnosis not present

## 2018-11-24 DIAGNOSIS — I10 Essential (primary) hypertension: Secondary | ICD-10-CM

## 2018-11-24 DIAGNOSIS — K219 Gastro-esophageal reflux disease without esophagitis: Secondary | ICD-10-CM

## 2018-11-24 DIAGNOSIS — R5383 Other fatigue: Secondary | ICD-10-CM

## 2018-11-24 DIAGNOSIS — B001 Herpesviral vesicular dermatitis: Secondary | ICD-10-CM

## 2018-11-24 MED ORDER — MUPIROCIN 2 % EX OINT: 1.0000 "application " | TOPICAL_OINTMENT | Freq: Two times a day (BID) | CUTANEOUS | 0 refills | Status: DC

## 2018-11-24 MED ORDER — CITALOPRAM HYDROBROMIDE 40 MG PO TABS: 40.0000 mg | ORAL_TABLET | Freq: Every day | ORAL | 1 refills | Status: DC

## 2018-11-24 MED ORDER — VALACYCLOVIR HCL 1 G PO TABS: 1000.0000 mg | ORAL_TABLET | Freq: Two times a day (BID) | ORAL | 3 refills | Status: DC

## 2018-11-24 MED ORDER — PANTOPRAZOLE SODIUM 40 MG PO TBEC: 40.0000 mg | DELAYED_RELEASE_TABLET | Freq: Every day | ORAL | 3 refills | Status: DC

## 2018-11-24 MED ORDER — ALPRAZOLAM 0.5 MG PO TABS: 0.5000 mg | ORAL_TABLET | Freq: Two times a day (BID) | ORAL | 3 refills | Status: DC | PRN

## 2018-11-24 MED ORDER — OLMESARTAN MEDOXOMIL 40 MG PO TABS: 40.0000 mg | ORAL_TABLET | Freq: Every day | ORAL | 3 refills | Status: DC

## 2018-11-24 MED ORDER — MIRTAZAPINE 15 MG PO TABS: 15.0000 mg | ORAL_TABLET | Freq: Every day | ORAL | 1 refills | Status: DC

## 2018-11-24 MED ORDER — HYDROCHLOROTHIAZIDE 12.5 MG PO CAPS: 12.5000 mg | ORAL_CAPSULE | Freq: Every day | ORAL | 3 refills | Status: DC

## 2018-11-24 NOTE — Progress Notes (Signed)
BP 124/73   Pulse 61   Temp (!) 97.1 F (36.2 C) (Oral)   Ht 5' 5"  (1.651 m)   Wt 145 lb 6.4 oz (66 kg)   BMI 24.20 kg/m    Subjective:    Patient ID: Jody Wilson, female    DOB: Dec 02, 1951, 67 y.o.   MRN: [id number]  HPI: Jody Wilson is a 67 y.o. female presenting on 11/24/2018 for Hypertension (6 month follow up); Hyperlipidemia; and Fatigue (Patient states she has no energy since she got her prolia shot Aug 14th. Also states that she is easy to bruise now and has not been eating the same.)   HPI Fatigue Patient says she has been having some fatigue and difficulty sleeping but has been doing better recently.  She thinks it may possibly be because of the Prolia she got for the first time on August 14 of 2019 4 months ago.  She also feels like she has been having difficulty sleeping since that time and she may have had some stomach upset and this more since that time and she also thinks that she may be more easy to bruise now than what she was before and that she does not have the appetite that she had before.  Anxiety and depression Patient is coming in for recheck of anxiety and depression.  She is currently on Celexa and alprazolam and Remeron and says they are working well for her.  Patient denies any suicidal ideations or thoughts of hurting herself. Depression screen [facility] 2/9 11/30/2018 11/24/2018 07/26/2018 05/18/2018 03/31/2018  Decreased Interest 1 0 1 0 0  Down, Depressed, Hopeless 0 0 2 0 0  PHQ - 2 Score 1 0 3 0 0  Altered sleeping - - 3 - -  Tired, decreased energy - - 3 - -  Change in appetite - - 3 - -  Feeling bad or failure about yourself  - - 1 - -  Trouble concentrating - - 0 - -  Moving slowly or fidgety/restless - - 0 - -  Suicidal thoughts - - 0 - -  PHQ-9 Score - - 13 - -     Hypertension Patient is currently on olmesartan and hydrochlorothiazide, and their blood pressure today is 124/73. Patient denies any lightheadedness or dizziness. Patient denies  headaches, blurred vision, chest pains, shortness of breath, or weakness. Denies any side effects from medication and is content with current medication.   GERD Patient is currently on Protonix.  She denies any major symptoms or abdominal pain or belching or burping. She denies any blood in her stool or lightheadedness or dizziness.   Refill medication for cold sores  Relevant past medical, surgical, family and social history reviewed and updated as indicated. Interim medical history since our last visit reviewed. Allergies and medications reviewed and updated.  Review of Systems  Constitutional: Positive for appetite change and fatigue. Negative for chills and fever.  HENT: Negative for congestion, ear discharge and ear pain.   Eyes: Negative for redness and visual disturbance.  Respiratory: Negative for chest tightness and shortness of breath.   Cardiovascular: Negative for chest pain and leg swelling.  Gastrointestinal: Negative for abdominal pain, constipation, diarrhea, nausea and vomiting.  Genitourinary: Negative for difficulty urinating and dysuria.  Musculoskeletal: Negative for back pain and gait problem.  Skin: Negative for rash.  Neurological: Negative for light-headedness and headaches.  Psychiatric/Behavioral: Positive for sleep disturbance. Negative for agitation, behavioral problems, decreased concentration, dysphoric mood, self-injury and suicidal  ideas. The patient is not nervous/anxious.   All other systems reviewed and are negative.   Per HPI unless specifically indicated above   Allergies as of 11/24/2018      Reactions   Tetracyclines & Related Itching, Swelling      Medication List        Accurate as of 11/24/18  9:05 AM. Always use your most recent med list.          albuterol 108 (90 Base) MCG/ACT inhaler Commonly known as:  PROVENTIL HFA;VENTOLIN HFA Inhale 2 puffs into the lungs every 6 (six) hours as needed for wheezing or shortness of breath.     ALPRAZolam 0.5 MG tablet Commonly known as:  XANAX Take 1 tablet (0.5 mg total) by mouth 2 (two) times daily as needed for anxiety.   calcium-vitamin D 500-200 MG-UNIT tablet Commonly known as:  OSCAL WITH D Take 1 tablet by mouth.   citalopram 40 MG tablet Commonly known as:  CELEXA Take 1 tablet (40 mg total) by mouth daily.   clobetasol 0.05 % external solution Commonly known as:  TEMOVATE Apply 1 application topically 2 (two) times daily.   denosumab 60 MG/ML Sosy injection Commonly known as:  PROLIA Inject 60 mg into the skin every 6 (six) months.   hydrochlorothiazide 12.5 MG capsule Commonly known as:  MICROZIDE Take 1 capsule (12.5 mg total) by mouth daily.   HYDROcodone-acetaminophen 5-325 MG tablet Commonly known as:  NORCO/VICODIN Take 1 tablet by mouth every 6 (six) hours as needed for moderate pain.   L-Lysine 500 MG Tabs Take 1 tablet by mouth daily.   mirtazapine 15 MG tablet Commonly known as:  REMERON Take 1 tablet (15 mg total) by mouth at bedtime.   olmesartan 40 MG tablet Commonly known as:  BENICAR Take 1 tablet (40 mg total) by mouth daily.   pantoprazole 40 MG tablet Commonly known as:  PROTONIX Take 1 tablet (40 mg total) by mouth daily.   tiZANidine 4 MG capsule Commonly known as:  ZANAFLEX Take 1 capsule (4 mg total) by mouth 3 (three) times daily.   valACYclovir 1000 MG tablet Commonly known as:  VALTREX Take 1 tablet (1,000 mg total) by mouth 2 (two) times daily.          Objective:    BP 124/73   Pulse 61   Temp (!) 97.1 F (36.2 C) (Oral)   Ht 5' 5"  (1.651 m)   Wt 145 lb 6.4 oz (66 kg)   BMI 24.20 kg/m   Wt Readings from Last 3 Encounters:  11/24/18 145 lb 6.4 oz (66 kg)  07/26/18 147 lb (66.7 kg)  05/18/18 140 lb (63.5 kg)    Physical Exam Vitals signs and nursing note reviewed.  Constitutional:      General: She is not in acute distress.    Appearance: She is well-developed. She is not diaphoretic.  HENT:      Mouth/Throat:     Mouth: Mucous membranes are moist.     Pharynx: Oropharynx is clear.  Eyes:     Extraocular Movements: Extraocular movements intact.     Conjunctiva/sclera: Conjunctivae normal.     Pupils: Pupils are equal, round, and reactive to light.  Cardiovascular:     Rate and Rhythm: Normal rate and regular rhythm.     Heart sounds: Normal heart sounds. No murmur.  Pulmonary:     Effort: Pulmonary effort is normal. No respiratory distress.     Breath sounds: Normal breath  sounds. No wheezing.  Musculoskeletal: Normal range of motion.        General: No tenderness.  Skin:    General: Skin is warm and dry.     Findings: No rash.  Neurological:     Mental Status: She is alert and oriented to person, place, and time.     Cranial Nerves: No cranial nerve deficit.     Sensory: No sensory deficit.     Motor: No weakness.     Coordination: Coordination normal.     Gait: Gait normal.  Psychiatric:        Behavior: Behavior normal.         Assessment & Plan:   Problem List Items Addressed This Visit      Cardiovascular and Mediastinum   Hypertension   Relevant Medications   hydrochlorothiazide (MICROZIDE) 12.5 MG capsule   olmesartan (BENICAR) 40 MG tablet   Other Relevant Orders   CMP14+EGFR (Completed)     Digestive   GERD (gastroesophageal reflux disease)   Relevant Medications   pantoprazole (PROTONIX) 40 MG tablet   Other Relevant Orders   CBC with Differential/Platelet (Completed)   TSH (Completed)     Other   Adjustment disorder with mixed anxiety and depressed mood - Primary   Relevant Medications   citalopram (CELEXA) 40 MG tablet   ALPRAZolam (XANAX) 0.5 MG tablet   mirtazapine (REMERON) 15 MG tablet   Other Relevant Orders   CBC with Differential/Platelet (Completed)   TSH (Completed)    Other Visit Diagnoses    Cold sore       Relevant Medications   valACYclovir (VALTREX) 1000 MG tablet   Other fatigue       Relevant Orders   CBC with  Differential/Platelet (Completed)   TSH (Completed)    Will check blood work and continue anxiety medication.  Continue olmesartan and hydrochlorothiazide for blood pressure.  Check blood work for fatigue but consider sleep study in the future  Follow up plan: Return in about 3 months (around 02/23/2019), or if symptoms worsen or fail to improve, for Anxiety depression and hypertension recheck.  Counseling provided for all of the vaccine components Orders Placed This Encounter  Procedures  . CMP14+EGFR  . CBC with Differential/Platelet  . TSH    [name], MD [facility] 11/24/2018, 9:05 AM

## 2018-11-24 NOTE — Telephone Encounter (Signed)
I sent in [place] for her

## 2018-11-24 NOTE — Telephone Encounter (Signed)
Pt aware.

## 2018-11-25 LAB — TSH: TSH: 0.676 u[IU]/mL (ref 0.450–4.500)

## 2018-11-25 LAB — CMP14+EGFR
ALT: 35 IU/L — ABNORMAL HIGH (ref 0–32)
AST: 42 IU/L — ABNORMAL HIGH (ref 0–40)
Albumin/Globulin Ratio: 1.5 (ref 1.2–2.2)
Albumin: 4.1 g/dL (ref 3.6–4.8)
Alkaline Phosphatase: 73 IU/L (ref 39–117)
BUN/Creatinine Ratio: 19 (ref 12–28)
BUN: 15 mg/dL (ref 8–27)
Bilirubin Total: 0.4 mg/dL (ref 0.0–1.2)
CO2: 24 mmol/L (ref 20–29)
Calcium: 9.2 mg/dL (ref 8.7–10.3)
Chloride: 100 mmol/L (ref 96–106)
Creatinine, Ser: 0.78 mg/dL (ref 0.57–1.00)
GFR calc Af Amer: 92 mL/min/{1.73_m2} (ref >59)
GFR calc non Af Amer: 79 mL/min/{1.73_m2} (ref >59)
Globulin, Total: 2.7 g/dL (ref 1.5–4.5)
Glucose: 96 mg/dL (ref 65–99)
Potassium: 4.2 mmol/L (ref 3.5–5.2)
Sodium: 140 mmol/L (ref 134–144)
Total Protein: 6.8 g/dL (ref 6.0–8.5)

## 2018-11-25 LAB — CBC WITH DIFFERENTIAL/PLATELET
Basophils Absolute: 0 10*3/uL (ref 0.0–0.2)
Basos: 1 %
EOS (ABSOLUTE): 0.1 10*3/uL (ref 0.0–0.4)
Eos: 2 %
Hematocrit: 42.3 % (ref 34.0–46.6)
Hemoglobin: 14.9 g/dL (ref 11.1–15.9)
Immature Grans (Abs): 0 10*3/uL (ref 0.0–0.1)
Immature Granulocytes: 0 %
Lymphocytes Absolute: 2 10*3/uL (ref 0.7–3.1)
Lymphs: 28 %
MCH: 34.4 pg — ABNORMAL HIGH (ref 26.6–33.0)
MCHC: 35.2 g/dL (ref 31.5–35.7)
MCV: 98 fL — ABNORMAL HIGH (ref 79–97)
Monocytes Absolute: 0.6 10*3/uL (ref 0.1–0.9)
Monocytes: 8 %
Neutrophils Absolute: 4.4 10*3/uL (ref 1.4–7.0)
Neutrophils: 61 %
Platelets: 277 10*3/uL (ref 150–450)
RBC: 4.33 x10E6/uL (ref 3.77–5.28)
RDW: 14 % (ref 12.3–15.4)
WBC: 7.2 10*3/uL (ref 3.4–10.8)

## 2018-11-30 ENCOUNTER — Encounter: Payer: Self-pay | Admitting: *Deleted

## 2018-11-30 ENCOUNTER — Ambulatory Visit (INDEPENDENT_AMBULATORY_CARE_PROVIDER_SITE_OTHER): Payer: Medicare Other | Admitting: *Deleted

## 2018-11-30 VITALS — BP 150/93 | HR 68 | Ht 65.0 in | Wt 145.0 lb

## 2018-11-30 DIAGNOSIS — Z23 Encounter for immunization: Secondary | ICD-10-CM

## 2018-11-30 DIAGNOSIS — Z0000 Encounter for general adult medical examination without abnormal findings: Secondary | ICD-10-CM

## 2018-11-30 NOTE — Patient Instructions (Addendum)
Please work on your goal of having 3 meals per day.  Try to include lean protein, vegetables, fruits and whole grains.  If you do not feel hungry between breakfast and supper- try having a snack like fruit and nuts, protein shake, or peanut butter crackers.   Please review the information given on Advance Directives.  If you complete the paperwork please bring a copy to our office to be filed in your chart.  Please continue to move carefully to avoid falls.   Thank you for coming in for your Annual Wellness Visit today!!   Preventive Care 65 Years and Older, Female Preventive care refers to lifestyle choices and visits with your health care provider that can promote health and wellness. What does preventive care include?  A yearly physical exam. This is also called an annual well check.  Dental exams once or twice a year.  Routine eye exams. Ask your health care provider how often you should have your eyes checked.  Personal lifestyle choices, including: ? Daily care of your teeth and gums. ? Regular physical activity. ? Eating a healthy diet. ? Avoiding tobacco and drug use. ? Limiting alcohol use. ? Practicing safe sex. ? Taking low-dose aspirin every day. ? Taking vitamin and mineral supplements as recommended by your health care provider. What happens during an annual well check? The services and screenings done by your health care provider during your annual well check will depend on your age, overall health, lifestyle risk factors, and family history of disease. Counseling Your health care provider may ask you questions about your:  Alcohol use.  Tobacco use.  Drug use.  Emotional well-being.  Home and relationship well-being.  Sexual activity.  Eating habits.  History of falls.  Memory and ability to understand (cognition).  Work and work Statistician.  Reproductive health.  Screening You may have the following tests or measurements:  Height, weight,  and BMI.  Blood pressure.  Lipid and cholesterol levels. These may be checked every 5 years, or more frequently if you are over 40 years old.  Skin check.  Lung cancer screening. You may have this screening every year starting at age 86 if you have a 30-pack-year history of smoking and currently smoke or have quit within the past 15 years.  Fecal occult blood test (FOBT) of the stool. You may have this test every year starting at age 9.  Flexible sigmoidoscopy or colonoscopy. You may have a sigmoidoscopy every 5 years or a colonoscopy every 10 years starting at age 60.  Hepatitis C blood test.  Hepatitis B blood test.  Sexually transmitted disease (STD) testing.  Diabetes screening. This is done by checking your blood sugar (glucose) after you have not eaten for a while (fasting). You may have this done every 1-3 years.  Bone density scan. This is done to screen for osteoporosis. You may have this done starting at age 49.  Mammogram. This may be done every 1-2 years. Talk to your health care provider about how often you should have regular mammograms.  Talk with your health care provider about your test results, treatment options, and if necessary, the need for more tests. Vaccines Your health care provider may recommend certain vaccines, such as:  Influenza vaccine. This is recommended every year.  Tetanus, diphtheria, and acellular pertussis (Tdap, Td) vaccine. You may need a Td booster every 10 years.  Varicella vaccine. You may need this if you have not been vaccinated.  Zoster vaccine. You may need  this after age 74.  Measles, mumps, and rubella (MMR) vaccine. You may need at least one dose of MMR if you were born in 1957 or later. You may also need a second dose.  Pneumococcal 13-valent conjugate (PCV13) vaccine. One dose is recommended after age 60.  Pneumococcal polysaccharide (PPSV23) vaccine. One dose is recommended after age 24.  Meningococcal vaccine. You may  need this if you have certain conditions.  Hepatitis A vaccine. You may need this if you have certain conditions or if you travel or work in places where you may be exposed to hepatitis A.  Hepatitis B vaccine. You may need this if you have certain conditions or if you travel or work in places where you may be exposed to hepatitis B.  Haemophilus influenzae type b (Hib) vaccine. You may need this if you have certain conditions.  Talk to your health care provider about which screenings and vaccines you need and how often you need them. This information is not intended to replace advice given to you by your health care provider. Make sure you discuss any questions you have with your health care provider. Document Released: 01/02/2016 Document Revised: 08/25/2016 Document Reviewed: 10/07/2015 Elsevier Interactive Patient Education  2018 [place] in the Home Falls can cause injuries. They can happen to people of all ages. There are many things you can do to make your home safe and to help prevent falls. What can I do on the outside of my home?  Regularly fix the edges of walkways and driveways and fix any cracks.  Remove anything that might make you trip as you walk through a door, such as a raised step or threshold.  Trim any bushes or trees on the path to your home.  Use bright outdoor lighting.  Clear any walking paths of anything that might make someone trip, such as rocks or tools.  Regularly check to see if handrails are loose or broken. Make sure that both sides of any steps have handrails.  Any raised decks and porches should have guardrails on the edges.  Have any leaves, snow, or ice cleared regularly.  Use sand or salt on walking paths during winter.  Clean up any spills in your garage right away. This includes oil or grease spills. What can I do in the bathroom?  Use night lights.  Install grab bars by the toilet and in the tub and shower. Do  not use towel bars as grab bars.  Use non-skid mats or decals in the tub or shower.  If you need to sit down in the shower, use a plastic, non-slip stool.  Keep the floor dry. Clean up any water that spills on the floor as soon as it happens.  Remove soap buildup in the tub or shower regularly.  Attach bath mats securely with double-sided non-slip rug tape.  Do not have throw rugs and other things on the floor that can make you trip. What can I do in the bedroom?  Use night lights.  Make sure that you have a light by your bed that is easy to reach.  Do not use any sheets or blankets that are too big for your bed. They should not hang down onto the floor.  Have a firm chair that has side arms. You can use this for support while you get dressed.  Do not have throw rugs and other things on the floor that can make you trip. What can I do  in the kitchen?  Clean up any spills right away.  Avoid walking on wet floors.  Keep items that you use a lot in easy-to-reach places.  If you need to reach something above you, use a strong step stool that has a grab bar.  Keep electrical cords out of the way.  Do not use floor polish or wax that makes floors slippery. If you must use wax, use non-skid floor wax.  Do not have throw rugs and other things on the floor that can make you trip. What can I do with my stairs?  Do not leave any items on the stairs.  Make sure that there are handrails on both sides of the stairs and use them. Fix handrails that are broken or loose. Make sure that handrails are as long as the stairways.  Check any carpeting to make sure that it is firmly attached to the stairs. Fix any carpet that is loose or worn.  Avoid having throw rugs at the top or bottom of the stairs. If you do have throw rugs, attach them to the floor with carpet tape.  Make sure that you have a light switch at the top of the stairs and the bottom of the stairs. If you do not have them,  ask someone to add them for you. What else can I do to help prevent falls?  Wear shoes that: ? Do not have high heels. ? Have rubber bottoms. ? Are comfortable and fit you well. ? Are closed at the toe. Do not wear sandals.  If you use a stepladder: ? Make sure that it is fully opened. Do not climb a closed stepladder. ? Make sure that both sides of the stepladder are locked into place. ? Ask someone to hold it for you, if possible.  Clearly mark and make sure that you can see: ? Any grab bars or handrails. ? First and last steps. ? Where the edge of each step is.  Use tools that help you move around (mobility aids) if they are needed. These include: ? Canes. ? Walkers. ? Scooters. ? Crutches.  Turn on the lights when you go into a dark area. Replace any light bulbs as soon as they burn out.  Set up your furniture so you have a clear path. Avoid moving your furniture around.  If any of your floors are uneven, fix them.  If there are any pets around you, be aware of where they are.  Review your medicines with your doctor. Some medicines can make you feel dizzy. This can increase your chance of falling. Ask your doctor what other things that you can do to help prevent falls. This information is not intended to replace advice given to you by your health care provider. Make sure you discuss any questions you have with your health care provider. Document Released: 10/02/2009 Document Revised: 05/13/2016 Document Reviewed: 01/10/2015 Elsevier Interactive Patient Education  [employer].

## 2018-11-30 NOTE — Progress Notes (Addendum)
Subjective:   Jody Wilson is a 67 y.o. female who presents for an Initial Medicare Annual Wellness Visit.  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  She continues to work as needed, usually about 4 days per month.  She enjoys going to church, making jewelry, reading, and exercising.  She lives at home with her husband.  She has 2 adult sons, and 2 grandsons.  Jody Wilson feels her health is unchanged from last year overall.  She reports no ER visits, hospitalizations, or surgeries in the past year.  She did experience a fall 04/2018 which resulted in a fracture of her left foot, which did not require surgery.  Review of Systems     All systems negative today  Cardiac Risk Factors include: advanced age (>11men, >56 women);dyslipidemia;hypertension;smoking/ tobacco exposure     Objective:    Today's Vitals   11/30/18 1514 11/30/18 1518  BP: (!) 153/83 (!) 150/93  Pulse: 72 68  Weight: 145 lb (65.8 kg) 145 lb (65.8 kg)  Height: 5\' 5"  (1.651 m) 5\' 5"  (1.651 m)  PainSc: 0-No pain 0-No pain   Body mass index is 24.13 kg/m.  Advanced Directives 11/30/2018 02/15/2017 02/01/2017 12/10/2016  Does Patient Have a Medical Advance Directive? No No No No  Would patient like information on creating a medical advance directive? Yes (MAU/Ambulatory/Procedural Areas - Information given) No - Patient declined No - Patient declined No - Patient declined    Current Medications (verified) Outpatient Encounter Medications as of 11/30/2018  Medication Sig  . albuterol (PROVENTIL HFA;VENTOLIN HFA) 108 (90 Base) MCG/ACT inhaler Inhale 2 puffs into the lungs every 6 (six) hours as needed for wheezing or shortness of breath.  . ALPRAZolam (XANAX) 0.5 MG tablet Take 1 tablet (0.5 mg total) by mouth 2 (two) times daily as needed for anxiety.  . calcium-vitamin D (OSCAL WITH D) 500-200 MG-UNIT tablet Take 1 tablet by mouth 2 (two) times daily.   . citalopram (CELEXA) 40 MG tablet Take 1 tablet (40 mg  total) by mouth daily.  . clobetasol (TEMOVATE) 0.05 % external solution Apply 1 application topically 2 (two) times daily. (Patient taking differently: Apply 1 application topically 2 (two) times daily as needed. )  . denosumab (PROLIA) 60 MG/ML SOSY injection Inject 60 mg into the skin every 6 (six) months.  . hydrochlorothiazide (MICROZIDE) 12.5 MG capsule Take 1 capsule (12.5 mg total) by mouth daily.  Marland Kitchen L-Lysine 500 MG TABS Take 1 tablet by mouth daily.  . mirtazapine (REMERON) 15 MG tablet Take 1 tablet (15 mg total) by mouth at bedtime.  . mupirocin ointment (BACTROBAN) 2 % Place 1 application into the nose 2 (two) times daily. (Patient taking differently: Place 1 application into the nose 2 (two) times daily as needed. )  . olmesartan (BENICAR) 40 MG tablet Take 1 tablet (40 mg total) by mouth daily.  . pantoprazole (PROTONIX) 40 MG tablet Take 1 tablet (40 mg total) by mouth daily.  . valACYclovir (VALTREX) 1000 MG tablet Take 1 tablet (1,000 mg total) by mouth 2 (two) times daily. (Patient taking differently: Take 1,000 mg by mouth 2 (two) times daily as needed. )  . [DISCONTINUED] HYDROcodone-acetaminophen (NORCO) 5-325 MG tablet Take 1 tablet by mouth every 6 (six) hours as needed for moderate pain.  Marland Kitchen tiZANidine (ZANAFLEX) 4 MG capsule Take 1 capsule (4 mg total) by mouth 3 (three) times daily. (Patient not taking: Reported on 11/30/2018)   No facility-administered encounter medications on file as  of 11/30/2018.     Allergies (verified) Tetracyclines & related   History: Past Medical History:  Diagnosis Date  . Depression   . Hypertension    Past Surgical History:  Procedure Laterality Date  . ABDOMINAL HYSTERECTOMY    . CHOLECYSTECTOMY    . FOOT SURGERY     7 in the past  . IR GENERIC HISTORICAL  12/09/2016   IR RADIOLOGIST EVAL & MGMT 12/09/2016 [facility]  . IR GENERIC HISTORICAL  12/10/2016   IR VERTEBROPLASTY CERV/THOR BX INC UNI/BIL INC/INJECT/IMAGING  12/10/2016 [facility]  . TRACHEOSTOMY TUBE PLACEMENT  1982   Family History  Problem Relation Age of Onset  . Heart disease Mother   . Hypertension Mother   . Hyperlipidemia Mother   . Diabetes Mother   . Congestive Heart Failure Mother   . Hypertension Son   . Other Son 27       Kienbock's Disease  . Liver disease Son 19       Gilbert's Disease   Social History   Socioeconomic History  . Marital status: Married    Spouse name: Not on file  . Number of children: Not on file  . Years of education: Not on file  . Highest education level: Not on file  Occupational History  . Not on file  Social Needs  . Financial resource strain: Not very hard  . Food insecurity:    Worry: Never true    Inability: Never true  . Transportation needs:    Medical: No    Non-medical: No  Tobacco Use  . Smoking status: Current Every Day Smoker    Packs/day: 0.25    Types: Cigarettes  . Smokeless tobacco: Never Used  Substance and Sexual Activity  . Alcohol use: Yes    Comment: occassionally   . Drug use: No  . Sexual activity: Not on file  Lifestyle  . Physical activity:    Days per week: 2 days    Minutes per session: 20 min  . Stress: Only a little  Relationships  . Social connections:    Talks on phone: More than three times a week    Gets together: More than three times a week    Attends religious service: More than 4 times per year    Active member of club or organization: No    Attends meetings of clubs or organizations: Never    Relationship status: Married  Other Topics Concern  . Not on file  Social History Narrative  . Not on file    Tobacco Counseling Patient not ready to quit smoking   Clinical Intake:     Pain Score: 0-No pain                  Activities of Daily Living In your present state of health, do you have any difficulty performing the following activities: 11/30/2018  Hearing? N  Vision? N  Difficulty concentrating or making  decisions? Y  Comment Short term memory has decreased   Walking or climbing stairs? N  Dressing or bathing? N  Doing errands, shopping? N  Preparing Food and eating ? N  Using the Toilet? N  In the past six months, have you accidently leaked urine? N  Do you have problems with loss of bowel control? N  Managing your Medications? N  Managing your Finances? N  Housekeeping or managing your Housekeeping? N  Some recent data might be hidden     Immunizations and Health  Maintenance Immunization History  Administered Date(s) Administered  . Influenza, High Dose Seasonal PF 11/30/2018  . Pneumococcal Conjugate-13 03/15/2018   There are no preventive care reminders to display for this patient.  Patient Care Team: Dettinger, Fransisca Kaufmann, MD as PCP - General (Family Medicine) Steffanie Rainwater, DPM as Consulting Physician (Podiatry) Arvella Nigh, MD as Consulting Physician (Obstetrics and Gynecology)       Assessment:   This is a routine wellness examination for Trachelle.  Hearing/Vision screen No hearing deficit noted. Patient goes to [facility] for eye exams yearly.  Last check was 12/2017.  States she sees well with her glasses.  Dietary issues and exercise activities discussed:  Patient states she usually eats 2 meals per day.  For breakfast she has eggs, toast, and grits, and for supper she has a protein source and various side dishes.  She states her appetite has been decreased over the past few months.  Sometimes she only feels like eating one meal per day.  Advised patient to continue to try to make healthy choices including vegetables, fruits, lean proteins, and whole grains as often as possible.  Encouraged her to try to eat 3 times per day even if they are small meals.  Patient states she has access to all the food she needs.  Current Exercise Habits: Home exercise routine, Type of exercise: walking, Time (Minutes): 20, Frequency (Times/Week): 2, Weekly Exercise (Minutes/Week): 40,  Intensity: Moderate, Exercise limited by: orthopedic condition(s)  Goals    . Have 3 meals a day     Try to include lean protein, vegetables, fruits and whole grains.  If you do not feel hungry between breakfast and supper- try having a snack like fruit and nuts, protein shake, or peanut butter crackers.       Depression Screen PHQ 2/9 Scores 11/30/2018 11/24/2018 07/26/2018 05/18/2018 03/31/2018 03/15/2018 08/06/2017  PHQ - 2 Score 1 0 3 0 0 1 0  PHQ- 9 Score - - 13 - - - -  Exception Documentation - - - - - - -    Fall Risk Fall Risk  11/30/2018 11/24/2018 07/26/2018 05/18/2018 03/15/2018  Falls in the past year? 1 0 Yes Yes No  Number falls in past yr: 1 - 2 or more 1 -  Injury with Fall? 1 - Yes Yes -  Comment - - - fractured foot -  Risk for fall due to : History of fall(s);Impaired balance/gait - - - -  Follow up Education provided;Falls prevention discussed - - - -    Is the patient's home free of loose throw rugs in walkways, pet beds, electrical cords, etc?   yes      Grab bars in the bathroom? no      Handrails on the stairs?   yes      Adequate lighting?   yes    Cognitive Function: MMSE - Mini Mental State Exam 11/30/2018  Orientation to time 5  Orientation to Place 5  Registration 3  Attention/ Calculation 5  Recall 3  Language- name 2 objects 2  Language- repeat 1  Language- follow 3 step command 3  Language- read & follow direction 1  Write a sentence 1  Copy design 1  Total score 30        Screening Tests Health Maintenance  Topic Date Due  . TETANUS/TDAP  12/26/2018 (Originally 12/19/1970)  . INFLUENZA VACCINE  04/09/2019 (Originally 07/20/2018)  . PNA vac Low Risk Adult (2 of 2 - PPSV23)  03/16/2019  . MAMMOGRAM  09/25/2020  . COLONOSCOPY  02/18/2024  . DEXA SCAN  Completed  . Hepatitis C Screening  Completed   Tdap declined today High dose flu vaccine given today.  Qualifies for Shingles Vaccine? Yes, declined today  Cancer Screenings: Lung: Low  Dose CT Chest recommended if Age 21-80 years, 30 pack-year currently smoking OR have quit w/in 15years. Patient does qualify. Breast: Up to date on Mammogram? Yes   Up to date of Bone Density/Dexa? Yes Colorectal: up to date  Additional Screenings:  Hepatitis C Screening:  Completed 05/18/2018     Plan:    Work on your goal of having 3 meals per day.  Try to include lean protein, vegetables, fruits and whole grains.  If you do not feel hungry between breakfast and supper- try having a snack like fruit and nuts, protein shake, or peanut butter crackers.  Review the information given on Advance Directives.  If you complete the paperwork please bring a copy to our office to be filed in your chart. Continue to move carefully to avoid falls.   I have personally reviewed and noted the following in the patient's chart:   . Medical and social history . Use of alcohol, tobacco or illicit drugs  . Current medications and supplements . Functional ability and status . Nutritional status . Physical activity . Advanced directives . List of other physicians . Hospitalizations, surgeries, and ER visits in previous 12 months . Vitals . Screenings to include cognitive, depression, and falls . Referrals and appointments  In addition, I have reviewed and discussed with patient certain preventive protocols, quality metrics, and best practice recommendations. A written personalized care plan for preventive services as well as general preventive health recommendations were provided to patient.     Quintara Bost M, RN   11/30/2018   I have reviewed and agree with the above AWV documentation.   Evelina Dun, FNP

## 2019-01-09 ENCOUNTER — Other Ambulatory Visit (HOSPITAL_COMMUNITY): Payer: Self-pay | Admitting: Obstetrics and Gynecology

## 2019-01-09 ENCOUNTER — Other Ambulatory Visit: Payer: Self-pay | Admitting: Obstetrics and Gynecology

## 2019-01-09 DIAGNOSIS — Z87891 Personal history of nicotine dependence: Secondary | ICD-10-CM

## 2019-01-16 DIAGNOSIS — M79672 Pain in left foot: Secondary | ICD-10-CM | POA: Diagnosis not present

## 2019-01-16 DIAGNOSIS — R601 Generalized edema: Secondary | ICD-10-CM | POA: Diagnosis not present

## 2019-01-16 DIAGNOSIS — S92215A Nondisplaced fracture of cuboid bone of left foot, initial encounter for closed fracture: Secondary | ICD-10-CM | POA: Diagnosis not present

## 2019-01-26 ENCOUNTER — Ambulatory Visit (HOSPITAL_COMMUNITY): Payer: Medicare Other

## 2019-01-26 ENCOUNTER — Encounter (HOSPITAL_COMMUNITY): Payer: Self-pay

## 2019-02-06 DIAGNOSIS — S92215D Nondisplaced fracture of cuboid bone of left foot, subsequent encounter for fracture with routine healing: Secondary | ICD-10-CM | POA: Diagnosis not present

## 2019-02-13 ENCOUNTER — Telehealth: Payer: Self-pay | Admitting: Family Medicine

## 2019-02-13 NOTE — Telephone Encounter (Signed)
Patient aware ok to wait until 3/11

## 2019-02-13 NOTE — Telephone Encounter (Signed)
PT is due for Prolia and she is wanting to know if it is okay for her to wait until her 3/11 apt with Dr Dettinger to get it?

## 2019-02-28 ENCOUNTER — Ambulatory Visit: Payer: Medicare Other | Admitting: Family Medicine

## 2019-03-27 ENCOUNTER — Other Ambulatory Visit: Payer: Self-pay

## 2019-03-27 ENCOUNTER — Ambulatory Visit (INDEPENDENT_AMBULATORY_CARE_PROVIDER_SITE_OTHER): Payer: Medicare Other | Admitting: Family Medicine

## 2019-03-27 ENCOUNTER — Encounter: Payer: Self-pay | Admitting: Family Medicine

## 2019-03-27 ENCOUNTER — Telehealth: Payer: Self-pay | Admitting: *Deleted

## 2019-03-27 DIAGNOSIS — H60312 Diffuse otitis externa, left ear: Secondary | ICD-10-CM | POA: Diagnosis not present

## 2019-03-27 MED ORDER — NEOMYCIN-POLYMYXIN-HC 1 % OT SOLN: 3.0000 [drp] | Freq: Four times a day (QID) | OTIC | 0 refills | Status: AC

## 2019-03-27 MED ORDER — CIPROFLOXACIN-DEXAMETHASONE 0.3-0.1 % OT SUSP: 4.0000 [drp] | Freq: Two times a day (BID) | OTIC | 0 refills | Status: DC

## 2019-03-27 NOTE — Telephone Encounter (Signed)
Sent in cortisporin drops.

## 2019-03-27 NOTE — Telephone Encounter (Signed)
Patient aware.

## 2019-03-27 NOTE — Progress Notes (Signed)
Virtual Visit via telephone Note Due to COVID-19, visit is conducted virtually and was requested by patient.  I connected with Jody Wilson on 03/27/19 at 1115 by telephone and verified that I am speaking with the correct person using two identifiers. Jody Wilson is currently located at home and family is currently with them during visit. The provider, Monia Pouch, FNP is located in their office at time of visit.  I discussed the limitations, risks, security and privacy concerns of performing an evaluation and management service by telephone and the availability of in person appointments. I also discussed with the patient that there may be a patient responsible charge related to this service. The patient expressed understanding and agreed to proceed.  Subjective:  Patient ID: Jody Wilson, female    DOB: [birth year], 68 y.o.   MRN: [id number]  Chief Complaint:  Ear Pain (left ear)   HPI: Jody Wilson is a 68 y.o. female presenting on 03/27/2019 for Ear Pain (left ear)   Pt reports left ear pain with pruritis, drainage, and swelling. She reports this started yesterday and is getting worse. States her ear feels sore and is sore to touch. States it is crusting due to the clear drainage. Pt denies fever, chills, or hearing difficulties. No blood or purulent drainage from ear. No headaches, weakness, or confusion. No other associated symptoms.    Relevant past medical, surgical, family, and social history reviewed and updated as indicated.  Allergies and medications reviewed and updated.   Past Medical History:  Diagnosis Date  . Depression   . Hypertension     Past Surgical History:  Procedure Laterality Date  . ABDOMINAL HYSTERECTOMY    . CHOLECYSTECTOMY    . FOOT SURGERY     7 in the past  . IR GENERIC HISTORICAL  12/09/2016   IR RADIOLOGIST EVAL & MGMT 12/09/2016 [facility]  . IR GENERIC HISTORICAL  12/10/2016   IR VERTEBROPLASTY CERV/THOR BX INC UNI/BIL  INC/INJECT/IMAGING 12/10/2016 [facility]  . TRACHEOSTOMY TUBE PLACEMENT  1982    Social History   Socioeconomic History  . Marital status: Married    Spouse name: Not on file  . Number of children: Not on file  . Years of education: Not on file  . Highest education level: Not on file  Occupational History  . Not on file  Social Needs  . Financial resource strain: Not very hard  . Food insecurity:    Worry: Never true    Inability: Never true  . Transportation needs:    Medical: No    Non-medical: No  Tobacco Use  . Smoking status: Current Every Day Smoker    Packs/day: 0.25    Types: Cigarettes  . Smokeless tobacco: Never Used  Substance and Sexual Activity  . Alcohol use: Yes    Comment: occassionally   . Drug use: No  . Sexual activity: Not on file  Lifestyle  . Physical activity:    Days per week: 2 days    Minutes per session: 20 min  . Stress: Only a little  Relationships  . Social connections:    Talks on phone: More than three times a week    Gets together: More than three times a week    Attends religious service: More than 4 times per year    Active member of club or organization: No    Attends meetings of clubs or organizations: Never    Relationship status: Married  . Intimate  partner violence:    Fear of current or ex partner: No    Emotionally abused: No    Physically abused: No    Forced sexual activity: No  Other Topics Concern  . Not on file  Social History Narrative  . Not on file    Outpatient Encounter Medications as of 03/27/2019  Medication Sig  . albuterol (PROVENTIL HFA;VENTOLIN HFA) 108 (90 Base) MCG/ACT inhaler Inhale 2 puffs into the lungs every 6 (six) hours as needed for wheezing or shortness of breath.  . ALPRAZolam (XANAX) 0.5 MG tablet Take 1 tablet (0.5 mg total) by mouth 2 (two) times daily as needed for anxiety.  . calcium-vitamin D (OSCAL WITH D) 500-200 MG-UNIT tablet Take 1 tablet by mouth 2 (two) times daily.   .  ciprofloxacin-dexamethasone (CIPRODEX) OTIC suspension Place 4 drops into the left ear 2 (two) times daily for 7 days.  . citalopram (CELEXA) 40 MG tablet Take 1 tablet (40 mg total) by mouth daily.  . clobetasol (TEMOVATE) 0.05 % external solution Apply 1 application topically 2 (two) times daily. (Patient taking differently: Apply 1 application topically 2 (two) times daily as needed. )  . denosumab (PROLIA) 60 MG/ML SOSY injection Inject 60 mg into the skin every 6 (six) months.  . hydrochlorothiazide (MICROZIDE) 12.5 MG capsule Take 1 capsule (12.5 mg total) by mouth daily.  Marland Kitchen L-Lysine 500 MG TABS Take 1 tablet by mouth daily.  . mirtazapine (REMERON) 15 MG tablet Take 1 tablet (15 mg total) by mouth at bedtime.  . mupirocin ointment (BACTROBAN) 2 % Place 1 application into the nose 2 (two) times daily. (Patient taking differently: Place 1 application into the nose 2 (two) times daily as needed. )  . olmesartan (BENICAR) 40 MG tablet Take 1 tablet (40 mg total) by mouth daily.  . pantoprazole (PROTONIX) 40 MG tablet Take 1 tablet (40 mg total) by mouth daily.  Marland Kitchen tiZANidine (ZANAFLEX) 4 MG capsule Take 1 capsule (4 mg total) by mouth 3 (three) times daily. (Patient not taking: Reported on 11/30/2018)  . valACYclovir (VALTREX) 1000 MG tablet Take 1 tablet (1,000 mg total) by mouth 2 (two) times daily. (Patient taking differently: Take 1,000 mg by mouth 2 (two) times daily as needed. )   No facility-administered encounter medications on file as of 03/27/2019.     Allergies  Allergen Reactions  . Tetracyclines & Related Itching and Swelling    Review of Systems  Constitutional: Negative for chills, fatigue and fever.  HENT: Positive for ear discharge and ear pain. Negative for congestion, facial swelling, hearing loss, postnasal drip, rhinorrhea, sinus pressure, sinus pain, sneezing, sore throat, tinnitus, trouble swallowing and voice change.   Respiratory: Negative for cough and shortness of  breath.   Cardiovascular: Negative for chest pain and palpitations.  Musculoskeletal: Negative for arthralgias and myalgias.  Neurological: Negative for dizziness, syncope, weakness, light-headedness and headaches.  Psychiatric/Behavioral: Negative for confusion.  All other systems reviewed and are negative.        Observations/Objective: No vital signs or physical exam, this was a telephone or virtual health encounter.  Pt alert and oriented, answers all questions appropriately, and able to speak in full sentences.    Assessment and Plan: Zonia was seen today for ear pain.  Diagnoses and all orders for this visit:  Acute diffuse otitis externa of left ear Symptomatic care discussed. Avoid getting water in the ear. Medications as prescribed. Report any new or worsening symptoms.  -  ciprofloxacin-dexamethasone (CIPRODEX) OTIC suspension; Place 4 drops into the left ear 2 (two) times daily for 7 days.     Follow Up Instructions: Return if symptoms worsen or fail to improve.    I discussed the assessment and treatment plan with the patient. The patient was provided an opportunity to ask questions and all were answered. The patient agreed with the plan and demonstrated an understanding of the instructions.   The patient was advised to call back or seek an in-person evaluation if the symptoms worsen or if the condition fails to improve as anticipated.  The above assessment and management plan was discussed with the patient. The patient verbalized understanding of and has agreed to the management plan. Patient is aware to call the clinic if symptoms persist or worsen. Patient is aware when to return to the clinic for a follow-up visit. Patient educated on when it is appropriate to go to the emergency department.    I provided 15 minutes of non-face-to-face time during this encounter. The call started at 1115. The call ended at 1130.   Monia Pouch, FNP-C [facility] [address] [place], [place] [address] [phone]

## 2019-03-27 NOTE — Telephone Encounter (Signed)
Fax from [facility] suspension Pt has a high deductible & copay, medication is $263.54 Please advise on alternative

## 2019-03-27 NOTE — Addendum Note (Signed)
Addended by: Baruch Gouty on: 03/27/2019 03:39 PM   Modules accepted: Orders

## 2019-04-24 ENCOUNTER — Telehealth: Payer: Self-pay | Admitting: Family Medicine

## 2019-04-24 NOTE — Telephone Encounter (Signed)
[name], do you have a order or med for this pt? Also - she is overdue for DEXA - and I thought these were being postponed.Marland Kitchen

## 2019-04-24 NOTE — Telephone Encounter (Signed)
Patient can have Prolia injection now.  I will call to schedule.  Dexa scans are being postponed until further notice per Carlon.  I will advise patient to check on getting dexa scan when she comes for her appointment with Dr. Warrick Parisian on 05/30/2019.

## 2019-04-26 ENCOUNTER — Other Ambulatory Visit: Payer: Self-pay

## 2019-04-26 ENCOUNTER — Telehealth: Payer: Self-pay | Admitting: *Deleted

## 2019-04-26 ENCOUNTER — Ambulatory Visit (INDEPENDENT_AMBULATORY_CARE_PROVIDER_SITE_OTHER): Payer: Medicare Other | Admitting: *Deleted

## 2019-04-26 DIAGNOSIS — M8000XD Age-related osteoporosis with current pathological fracture, unspecified site, subsequent encounter for fracture with routine healing: Secondary | ICD-10-CM

## 2019-04-26 MED ORDER — DENOSUMAB 60 MG/ML ~~LOC~~ SOSY
60.0000 mg | PREFILLED_SYRINGE | SUBCUTANEOUS | Status: DC
  Administered 2019-04-26 – 2021-01-08 (×3): 60 mg via SUBCUTANEOUS

## 2019-04-26 NOTE — Progress Notes (Signed)
Pt given Prolia inj Buy and bill Pt tolerated well

## 2019-04-26 NOTE — Telephone Encounter (Signed)
PROLIA: Summary of Benefits Interpretation  Apr 26, 2019     Purchase Information  Last purchase location: [employer] and [employer] . Primary: [employer] scientist of Benefits   . Estimated Cost to Patient: $0 after $198 deductible met . Prior authorization required: No   . Physician Purchase Covered: Yes    Scheduled 04/26/2019  Jody Wilson M   04/26/2019 [place] [phone]

## 2019-05-29 ENCOUNTER — Other Ambulatory Visit: Payer: Self-pay

## 2019-05-30 ENCOUNTER — Encounter: Payer: Self-pay | Admitting: Family Medicine

## 2019-05-30 ENCOUNTER — Ambulatory Visit (INDEPENDENT_AMBULATORY_CARE_PROVIDER_SITE_OTHER): Payer: Medicare Other | Admitting: Family Medicine

## 2019-05-30 VITALS — BP 130/80 | HR 66 | Temp 97.7°F | Ht 65.0 in | Wt 161.4 lb

## 2019-05-30 DIAGNOSIS — F4323 Adjustment disorder with mixed anxiety and depressed mood: Secondary | ICD-10-CM | POA: Diagnosis not present

## 2019-05-30 DIAGNOSIS — Z716 Tobacco abuse counseling: Secondary | ICD-10-CM

## 2019-05-30 DIAGNOSIS — I10 Essential (primary) hypertension: Secondary | ICD-10-CM

## 2019-05-30 DIAGNOSIS — E785 Hyperlipidemia, unspecified: Secondary | ICD-10-CM | POA: Diagnosis not present

## 2019-05-30 DIAGNOSIS — K219 Gastro-esophageal reflux disease without esophagitis: Secondary | ICD-10-CM | POA: Diagnosis not present

## 2019-05-30 DIAGNOSIS — Z23 Encounter for immunization: Secondary | ICD-10-CM | POA: Diagnosis not present

## 2019-05-30 LAB — LIPID PANEL

## 2019-05-30 MED ORDER — CITALOPRAM HYDROBROMIDE 40 MG PO TABS: 40.0000 mg | ORAL_TABLET | Freq: Every day | ORAL | 3 refills | Status: DC

## 2019-05-30 MED ORDER — MIRTAZAPINE 15 MG PO TABS: 15.0000 mg | ORAL_TABLET | Freq: Every day | ORAL | 3 refills | Status: DC

## 2019-05-30 MED ORDER — OLMESARTAN MEDOXOMIL 40 MG PO TABS: 40.0000 mg | ORAL_TABLET | Freq: Every day | ORAL | 3 refills | Status: DC

## 2019-05-30 MED ORDER — ALPRAZOLAM 0.5 MG PO TABS: 0.5000 mg | ORAL_TABLET | Freq: Two times a day (BID) | ORAL | 2 refills | Status: DC | PRN

## 2019-05-30 NOTE — Addendum Note (Signed)
Addended by: Karle Plumber on: 05/30/2019 09:29 AM   Modules accepted: Orders

## 2019-05-30 NOTE — Progress Notes (Signed)
BP 130/80   Pulse 66   Temp 97.7 F (36.5 C) (Oral)   Ht _0  (1.651 m)   Wt 161 lb 6.4 oz (73.2 kg)   BMI 26.86 kg/m    Subjective:   Patient ID: Jody Wilson, female    DOB: Jan 13, 1951, 68 y.o.   MRN: [id number]  HPI: Jody Wilson is a 68 y.o. female presenting on 05/30/2019 for Hypertension (check up of chronic medical conditions)   HPI Hypertension Patient is currently on olmesartan and hydrochlorothiazide, and their blood pressure today is 130/81. Patient denies any lightheadedness or dizziness. Patient denies headaches, blurred vision, chest pains, shortness of breath, or weakness. Denies any side effects from medication and is content with current medication.   Patient is currently a smoker and is trying to quit and is down to 4 cigarettes.  Hyperlipidemia Patient is coming in for recheck of his hyperlipidemia. The patient is currently taking no medication currently because she has been intolerant previously. They deny any issues with myalgias or history of liver damage from it. They deny any focal numbness or weakness or chest pain.   GERD Patient is currently on pantoprazole.  She denies any major symptoms or abdominal pain or belching or burping. She denies any blood in her stool or lightheadedness or dizziness.   Anxiety and adjustment disorder, Patient says her anxiety is doing very well and she currently uses Celexa and Xanax.  She does not use the Xanax consistently and refill it every time and does not need it every day but just uses it as needed.  PdMP was reviewed and there are no red flags.  Patient also uses the Remeron for sleep and will have her continue on this.  Patient does say that she still getting heat related issues concerning menopause and she is hot all the time and her husband is cold all the time so they are always fighting with the temperature.  Due to the smoking she is not a good candidate for hormone replacement therapy at this point.   Relevant past medical, surgical, family and social history reviewed and updated as indicated. Interim medical history since our last visit reviewed. Allergies and medications reviewed and updated.  Review of Systems  Constitutional: Negative for chills and fever.  Eyes: Negative for visual disturbance.  Respiratory: Negative for cough, chest tightness, shortness of breath and wheezing.   Cardiovascular: Negative for chest pain and leg swelling.  Musculoskeletal: Negative for back pain and gait problem.  Skin: Negative for rash.  Neurological: Negative for dizziness, light-headedness and headaches.  Psychiatric/Behavioral: Negative for agitation and behavioral problems.  All other systems reviewed and are negative.   Per HPI unless specifically indicated above   Allergies as of 05/30/2019      Reactions   Tetracyclines & Related Itching, Swelling      Medication List       Accurate as of May 30, 2019  8:52 AM. If you have any questions, ask your nurse or doctor.        albuterol 108 (90 Base) MCG/ACT inhaler Commonly known as:  VENTOLIN HFA Inhale 2 puffs into the lungs every 6 (six) hours as needed for wheezing or shortness of breath.   ALPRAZolam 0.5 MG tablet Commonly known as:  XANAX Take 1 tablet (0.5 mg total) by mouth 2 (two) times daily as needed for anxiety.   calcium-vitamin D 500-200 MG-UNIT tablet Commonly known as:  OSCAL WITH D Take 1 tablet by mouth  2 (two) times daily.   citalopram 40 MG tablet Commonly known as:  CELEXA Take 1 tablet (40 mg total) by mouth daily.   clobetasol 0.05 % external solution Commonly known as:  TEMOVATE Apply 1 application topically 2 (two) times daily. What changed:    when to take this  reasons to take this   denosumab 60 MG/ML Sosy injection Commonly known as:  PROLIA Inject 60 mg into the skin every 6 (six) months.   hydrochlorothiazide 12.5 MG capsule Commonly known as:  MICROZIDE Take 1 capsule (12.5 mg  total) by mouth daily.   L-Lysine 500 MG Tabs Take 1 tablet by mouth daily.   mirtazapine 15 MG tablet Commonly known as:  Remeron Take 1 tablet (15 mg total) by mouth at bedtime.   mupirocin ointment 2 % Commonly known as:  Bactroban Place 1 application into the nose 2 (two) times daily. What changed:    when to take this  reasons to take this   olmesartan 40 MG tablet Commonly known as:  BENICAR Take 1 tablet (40 mg total) by mouth daily.   pantoprazole 40 MG tablet Commonly known as:  PROTONIX Take 1 tablet (40 mg total) by mouth daily.   tiZANidine 4 MG capsule Commonly known as:  ZANAFLEX Take 1 capsule (4 mg total) by mouth 3 (three) times daily.   valACYclovir 1000 MG tablet Commonly known as:  VALTREX Take 1 tablet (1,000 mg total) by mouth 2 (two) times daily. What changed:    when to take this  reasons to take this        Objective:   BP 130/80   Pulse 66   Temp 97.7 F (36.5 C) (Oral)   Ht _0  (1.651 m)   Wt 161 lb 6.4 oz (73.2 kg)   BMI 26.86 kg/m   Wt Readings from Last 3 Encounters:  05/30/19 161 lb 6.4 oz (73.2 kg)  11/30/18 145 lb (65.8 kg)  11/24/18 145 lb 6.4 oz (66 kg)    Physical Exam Vitals signs and nursing note reviewed.  Constitutional:      General: She is not in acute distress.    Appearance: She is well-developed. She is not diaphoretic.  Eyes:     Conjunctiva/sclera: Conjunctivae normal.  Cardiovascular:     Rate and Rhythm: Normal rate and regular rhythm.     Heart sounds: Normal heart sounds. No murmur.  Pulmonary:     Effort: Pulmonary effort is normal. No respiratory distress.     Breath sounds: Normal breath sounds. No wheezing.  Musculoskeletal: Normal range of motion.        General: No tenderness.  Skin:    General: Skin is warm and dry.     Findings: No rash.  Neurological:     Mental Status: She is alert and oriented to person, place, and time.     Coordination: Coordination normal.  Psychiatric:         Behavior: Behavior normal.       Assessment & Plan:   Problem List Items Addressed This Visit      Cardiovascular and Mediastinum   Hypertension   Relevant Medications   olmesartan (BENICAR) 40 MG tablet   Other Relevant Orders   CMP14+EGFR     Digestive   GERD (gastroesophageal reflux disease)   Relevant Orders   CBC with Differential/Platelet     Other   Adjustment disorder with mixed anxiety and depressed mood   Relevant Medications   citalopram (  CELEXA) 40 MG tablet   mirtazapine (REMERON) 15 MG tablet   Hyperlipidemia with target LDL less than 100 - Primary   Relevant Medications   olmesartan (BENICAR) 40 MG tablet   Other Relevant Orders   Lipid panel    Other Visit Diagnoses    Encounter for tobacco use cessation counseling          Continue current medication, recommended dietary and lifestyle changes and also recommended Follow up plan: Return in about 3 months (around 08/30/2019), or if symptoms worsen or fail to improve, for Hypertension and anxiety and cholesterol.  Counseling provided for all of the vaccine components No orders of the defined types were placed in this encounter.   [name], MD [facility] 05/30/2019, 8:52 AM

## 2019-05-31 ENCOUNTER — Other Ambulatory Visit: Payer: Self-pay | Admitting: *Deleted

## 2019-05-31 DIAGNOSIS — R7989 Other specified abnormal findings of blood chemistry: Secondary | ICD-10-CM

## 2019-05-31 LAB — CMP14+EGFR
ALT: 45 IU/L — ABNORMAL HIGH (ref 0–32)
AST: 64 IU/L — ABNORMAL HIGH (ref 0–40)
Albumin/Globulin Ratio: 1.5 (ref 1.2–2.2)
Albumin: 4 g/dL (ref 3.8–4.8)
Alkaline Phosphatase: 78 IU/L (ref 39–117)
BUN/Creatinine Ratio: 21 (ref 12–28)
BUN: 18 mg/dL (ref 8–27)
Bilirubin Total: 0.4 mg/dL (ref 0.0–1.2)
CO2: 22 mmol/L (ref 20–29)
Calcium: 9.4 mg/dL (ref 8.7–10.3)
Chloride: 104 mmol/L (ref 96–106)
Creatinine, Ser: 0.84 mg/dL (ref 0.57–1.00)
GFR calc Af Amer: 83 mL/min/{1.73_m2} (ref >59)
GFR calc non Af Amer: 72 mL/min/{1.73_m2} (ref >59)
Globulin, Total: 2.7 g/dL (ref 1.5–4.5)
Glucose: 101 mg/dL — ABNORMAL HIGH (ref 65–99)
Potassium: 4.1 mmol/L (ref 3.5–5.2)
Sodium: 141 mmol/L (ref 134–144)
Total Protein: 6.7 g/dL (ref 6.0–8.5)

## 2019-05-31 LAB — CBC WITH DIFFERENTIAL/PLATELET
Basophils Absolute: 0 10*3/uL (ref 0.0–0.2)
Basos: 1 %
EOS (ABSOLUTE): 0.1 10*3/uL (ref 0.0–0.4)
Eos: 2 %
Hematocrit: 36.4 % (ref 34.0–46.6)
Hemoglobin: 12.3 g/dL (ref 11.1–15.9)
Immature Grans (Abs): 0 10*3/uL (ref 0.0–0.1)
Immature Granulocytes: 0 %
Lymphocytes Absolute: 1.7 10*3/uL (ref 0.7–3.1)
Lymphs: 27 %
MCH: 33.6 pg — ABNORMAL HIGH (ref 26.6–33.0)
MCHC: 33.8 g/dL (ref 31.5–35.7)
MCV: 100 fL — ABNORMAL HIGH (ref 79–97)
Monocytes Absolute: 0.5 10*3/uL (ref 0.1–0.9)
Monocytes: 9 %
Neutrophils Absolute: 3.9 10*3/uL (ref 1.4–7.0)
Neutrophils: 61 %
Platelets: 297 10*3/uL (ref 150–450)
RBC: 3.66 x10E6/uL — ABNORMAL LOW (ref 3.77–5.28)
RDW: 14.3 % (ref 11.7–15.4)
WBC: 6.3 10*3/uL (ref 3.4–10.8)

## 2019-05-31 LAB — LIPID PANEL
Chol/HDL Ratio: 4 ratio (ref 0.0–4.4)
Cholesterol, Total: 207 mg/dL — ABNORMAL HIGH (ref 100–199)
HDL: 52 mg/dL (ref >39)
LDL Calculated: 113 mg/dL — ABNORMAL HIGH (ref 0–99)
Triglycerides: 210 mg/dL — ABNORMAL HIGH (ref 0–149)
VLDL Cholesterol Cal: 42 mg/dL — ABNORMAL HIGH (ref 5–40)

## 2019-06-15 ENCOUNTER — Other Ambulatory Visit: Payer: Self-pay

## 2019-06-15 ENCOUNTER — Other Ambulatory Visit: Payer: Medicare Other

## 2019-06-15 DIAGNOSIS — R7989 Other specified abnormal findings of blood chemistry: Secondary | ICD-10-CM

## 2019-06-15 DIAGNOSIS — R945 Abnormal results of liver function studies: Secondary | ICD-10-CM | POA: Diagnosis not present

## 2019-06-16 LAB — CMP14+EGFR
ALT: 36 IU/L — ABNORMAL HIGH (ref 0–32)
AST: 40 IU/L (ref 0–40)
Albumin/Globulin Ratio: 1.4 (ref 1.2–2.2)
Albumin: 3.9 g/dL (ref 3.8–4.8)
Alkaline Phosphatase: 76 IU/L (ref 39–117)
BUN/Creatinine Ratio: 20 (ref 12–28)
BUN: 17 mg/dL (ref 8–27)
Bilirubin Total: 0.4 mg/dL (ref 0.0–1.2)
CO2: 19 mmol/L — ABNORMAL LOW (ref 20–29)
Calcium: 9.1 mg/dL (ref 8.7–10.3)
Chloride: 99 mmol/L (ref 96–106)
Creatinine, Ser: 0.84 mg/dL (ref 0.57–1.00)
GFR calc Af Amer: 83 mL/min/{1.73_m2} (ref >59)
GFR calc non Af Amer: 72 mL/min/{1.73_m2} (ref >59)
Globulin, Total: 2.8 g/dL (ref 1.5–4.5)
Glucose: 101 mg/dL — ABNORMAL HIGH (ref 65–99)
Potassium: 4.3 mmol/L (ref 3.5–5.2)
Sodium: 136 mmol/L (ref 134–144)
Total Protein: 6.7 g/dL (ref 6.0–8.5)

## 2019-06-16 LAB — HEPATITIS PANEL, ACUTE
Hep A IgM: NEGATIVE
Hep B C IgM: NEGATIVE
Hep C Virus Ab: 0.1 s/co ratio (ref 0.0–0.9)
Hepatitis B Surface Ag: NEGATIVE

## 2019-09-06 ENCOUNTER — Other Ambulatory Visit: Payer: Self-pay

## 2019-09-07 ENCOUNTER — Encounter: Payer: Self-pay | Admitting: Family Medicine

## 2019-09-07 ENCOUNTER — Ambulatory Visit (INDEPENDENT_AMBULATORY_CARE_PROVIDER_SITE_OTHER): Payer: Medicare Other | Admitting: Family Medicine

## 2019-09-07 VITALS — BP 125/78 | HR 70 | Temp 97.6°F | Ht 65.0 in | Wt 164.4 lb

## 2019-09-07 DIAGNOSIS — Z79891 Long term (current) use of opiate analgesic: Secondary | ICD-10-CM | POA: Diagnosis not present

## 2019-09-07 DIAGNOSIS — I10 Essential (primary) hypertension: Secondary | ICD-10-CM

## 2019-09-07 DIAGNOSIS — K219 Gastro-esophageal reflux disease without esophagitis: Secondary | ICD-10-CM | POA: Diagnosis not present

## 2019-09-07 DIAGNOSIS — F5089 Other specified eating disorder: Secondary | ICD-10-CM

## 2019-09-07 DIAGNOSIS — E785 Hyperlipidemia, unspecified: Secondary | ICD-10-CM

## 2019-09-07 DIAGNOSIS — R6889 Other general symptoms and signs: Secondary | ICD-10-CM | POA: Diagnosis not present

## 2019-09-07 DIAGNOSIS — R7989 Other specified abnormal findings of blood chemistry: Secondary | ICD-10-CM

## 2019-09-07 DIAGNOSIS — F4323 Adjustment disorder with mixed anxiety and depressed mood: Secondary | ICD-10-CM

## 2019-09-07 DIAGNOSIS — G514 Facial myokymia: Secondary | ICD-10-CM | POA: Diagnosis not present

## 2019-09-07 DIAGNOSIS — D509 Iron deficiency anemia, unspecified: Secondary | ICD-10-CM | POA: Diagnosis not present

## 2019-09-07 DIAGNOSIS — R945 Abnormal results of liver function studies: Secondary | ICD-10-CM

## 2019-09-07 MED ORDER — ALPRAZOLAM 0.5 MG PO TABS: 0.5000 mg | ORAL_TABLET | Freq: Two times a day (BID) | ORAL | 2 refills | Status: DC | PRN

## 2019-09-07 NOTE — Progress Notes (Signed)
BP 125/78   Pulse 70   Temp 97.6 F (36.4 C) (Temporal)   Ht 5' 5" (1.651 m)   Wt 164 lb 6.4 oz (74.6 kg)   SpO2 96%   BMI 27.36 kg/m    Subjective:   Patient ID: Jody Wilson, female    DOB: 1951-01-15, 68 y.o.   MRN: [id number]  HPI: Jody Wilson is a 68 y.o. female presenting on 09/07/2019 for Medical Management of Chronic Issues (three month recheck)   HPI Hypertension Patient is currently on Benicar and hydrochlorothiazide, and their blood pressure today is 125/78. Patient denies any lightheadedness or dizziness. Patient denies headaches, blurred vision, chest pains, shortness of breath, or weakness. Denies any side effects from medication and is content with current medication.   Hyperlipidemia Patient is coming in for recheck of his hyperlipidemia. The patient is currently taking no medication currently we are monitoring, her liver function has been elevated so that is why you have been resistant towards starting something right now. They deny any issues with myalgias or history of liver damage from it. They deny any focal numbness or weakness or chest pain.   GERD Patient is currently on pantoprazole.  She denies any major symptoms or abdominal pain or belching or burping. She denies any blood in her stool or lightheadedness or dizziness.   Anxiety depression Patient is coming in to discuss anxiety and depression and she feels like it is doing okay with the citalopram and the alprazolam.  She uses the alprazolam twice daily 0.5 mg as needed. Current rx-alprazolam 0.5 mg twice daily as needed # meds rx-60 Effectiveness of current meds-seems to be working well although she is getting some facial twitching and she does not know if it is due to the citalopram or something else, will go see a neurologist.  She says it is worse at night and it does not happen every day and only happen sometimes. Adverse reactions form meds-none  Pill count performed-No Last drug screen  -today ( high risk q59m moderate risk q632mlow risk yearly ) Urine drug screen today- Yes Was the NCErnesteviewed-yes  If yes were their any concerning findings? -None  No flowsheet data found.   Pain contract signed on: 09/07/2019  Relevant past medical, surgical, family and social history reviewed and updated as indicated. Interim medical history since our last visit reviewed. Allergies and medications reviewed and updated.  Review of Systems  Constitutional: Negative for chills and fever.  Eyes: Negative for visual disturbance.  Respiratory: Negative for chest tightness and shortness of breath.   Cardiovascular: Negative for chest pain and leg swelling.  Genitourinary: Negative for difficulty urinating and dysuria.  Musculoskeletal: Negative for back pain and gait problem.  Skin: Negative for rash.  Neurological: Positive for tremors. Negative for dizziness, speech difficulty, weakness, light-headedness, numbness and headaches.  Psychiatric/Behavioral: Negative for agitation and behavioral problems.  All other systems reviewed and are negative.   Per HPI unless specifically indicated above   Allergies as of 09/07/2019      Reactions   Tetracyclines & Related Itching, Swelling      Medication List       Accurate as of September 07, 2019  8:31 AM. If you have any questions, ask your nurse or doctor.        albuterol 108 (90 Base) MCG/ACT inhaler Commonly known as: VENTOLIN HFA Inhale 2 puffs into the lungs every 6 (six) hours as needed for wheezing or shortness of breath.  ALPRAZolam 0.5 MG tablet Commonly known as: XANAX Take 1 tablet (0.5 mg total) by mouth 2 (two) times daily as needed for anxiety.   calcium-vitamin D 500-200 MG-UNIT tablet Commonly known as: OSCAL WITH D Take 1 tablet by mouth 2 (two) times daily.   citalopram 40 MG tablet Commonly known as: CELEXA Take 1 tablet (40 mg total) by mouth daily.   clobetasol 0.05 % external solution Commonly  known as: TEMOVATE Apply 1 application topically 2 (two) times daily. What changed:   when to take this  reasons to take this   denosumab 60 MG/ML Sosy injection Commonly known as: PROLIA Inject 60 mg into the skin every 6 (six) months.   hydrochlorothiazide 12.5 MG capsule Commonly known as: MICROZIDE Take 1 capsule (12.5 mg total) by mouth daily.   L-Lysine 500 MG Tabs Take 1 tablet by mouth daily.   mirtazapine 15 MG tablet Commonly known as: Remeron Take 1 tablet (15 mg total) by mouth at bedtime.   mupirocin ointment 2 % Commonly known as: Bactroban Place 1 application into the nose 2 (two) times daily. What changed:   when to take this  reasons to take this   olmesartan 40 MG tablet Commonly known as: BENICAR Take 1 tablet (40 mg total) by mouth daily.   pantoprazole 40 MG tablet Commonly known as: PROTONIX Take 1 tablet (40 mg total) by mouth daily.   tiZANidine 4 MG capsule Commonly known as: ZANAFLEX Take 1 capsule (4 mg total) by mouth 3 (three) times daily.   valACYclovir 1000 MG tablet Commonly known as: VALTREX Take 1 tablet (1,000 mg total) by mouth 2 (two) times daily. What changed:   when to take this  reasons to take this        Objective:   BP 125/78   Pulse 70   Temp 97.6 F (36.4 C) (Temporal)   Ht 5' 5" (1.651 m)   Wt 164 lb 6.4 oz (74.6 kg)   SpO2 96%   BMI 27.36 kg/m   Wt Readings from Last 3 Encounters:  09/07/19 164 lb 6.4 oz (74.6 kg)  05/30/19 161 lb 6.4 oz (73.2 kg)  11/30/18 145 lb (65.8 kg)    Physical Exam Vitals signs and nursing note reviewed.  Constitutional:      General: She is not in acute distress.    Appearance: She is well-developed. She is not diaphoretic.  Eyes:     Conjunctiva/sclera: Conjunctivae normal.  Cardiovascular:     Rate and Rhythm: Normal rate and regular rhythm.     Heart sounds: Normal heart sounds. No murmur.  Pulmonary:     Effort: Pulmonary effort is normal. No respiratory  distress.     Breath sounds: Normal breath sounds. No wheezing.  Musculoskeletal: Normal range of motion.        General: No tenderness.  Skin:    General: Skin is warm and dry.     Findings: No rash.  Neurological:     Mental Status: She is alert and oriented to person, place, and time.     Cranial Nerves: No cranial nerve deficit or facial asymmetry.     Sensory: No sensory deficit.     Motor: No weakness or tremor (No tremor noted on exam).     Coordination: Coordination normal.  Psychiatric:        Behavior: Behavior normal.       Assessment & Plan:   Problem List Items Addressed This Visit  Cardiovascular and Mediastinum   Hypertension   Relevant Orders   CMP14+EGFR     Digestive   GERD (gastroesophageal reflux disease) - Primary   Relevant Orders   Anemia Profile B     Other   Adjustment disorder with mixed anxiety and depressed mood   Relevant Medications   ALPRAZolam (XANAX) 0.5 MG tablet   Other Relevant Orders   ToxASSURE Select 13 (MW), Urine   Hyperlipidemia with target LDL less than 100   Elevated liver function tests   Relevant Orders   CMP14+EGFR    Other Visit Diagnoses    Pica       Relevant Orders   Anemia Profile B   Facial twitching       Relevant Orders   Ambulatory referral to Neurology      Continue alprazolam, continue other medications, no changes today, will check blood work, she is been feeling Pacis will check for anemia.  With her facial tremor we will do neurology referral, she says she has been doing research and was concerned about tardive dyskinesia and she is not on medications currently that it typically cause that, the Remeron is new when she was having it before the Remeron and the citalopram is not 1 that is known culprit typically to cause it. Follow up plan: Return in about 3 months (around 12/07/2019), or if symptoms worsen or fail to improve, for Hypertension and anxiety and depression.  Counseling provided for  all of the vaccine components No orders of the defined types were placed in this encounter.   [name], MD [facility] 09/07/2019, 8:31 AM

## 2019-09-08 LAB — CMP14+EGFR
ALT: 33 IU/L — ABNORMAL HIGH (ref 0–32)
AST: 37 IU/L (ref 0–40)
Albumin/Globulin Ratio: 1.4 (ref 1.2–2.2)
Albumin: 3.8 g/dL (ref 3.8–4.8)
Alkaline Phosphatase: 87 IU/L (ref 39–117)
BUN/Creatinine Ratio: 15 (ref 12–28)
BUN: 13 mg/dL (ref 8–27)
Bilirubin Total: 0.3 mg/dL (ref 0.0–1.2)
CO2: 26 mmol/L (ref 20–29)
Calcium: 8.9 mg/dL (ref 8.7–10.3)
Chloride: 98 mmol/L (ref 96–106)
Creatinine, Ser: 0.84 mg/dL (ref 0.57–1.00)
GFR calc Af Amer: 83 mL/min/{1.73_m2} (ref >59)
GFR calc non Af Amer: 72 mL/min/{1.73_m2} (ref >59)
Globulin, Total: 2.8 g/dL (ref 1.5–4.5)
Glucose: 106 mg/dL — ABNORMAL HIGH (ref 65–99)
Potassium: 4.1 mmol/L (ref 3.5–5.2)
Sodium: 137 mmol/L (ref 134–144)
Total Protein: 6.6 g/dL (ref 6.0–8.5)

## 2019-09-08 LAB — ANEMIA PROFILE B
Basophils Absolute: 0.1 10*3/uL (ref 0.0–0.2)
Basos: 1 %
EOS (ABSOLUTE): 0.1 10*3/uL (ref 0.0–0.4)
Eos: 1 %
Ferritin: 13 ng/mL — ABNORMAL LOW (ref 15–150)
Folate: 3.4 ng/mL (ref >3.0)
Hematocrit: 35.8 % (ref 34.0–46.6)
Hemoglobin: 11.6 g/dL (ref 11.1–15.9)
Immature Grans (Abs): 0 10*3/uL (ref 0.0–0.1)
Immature Granulocytes: 0 %
Iron Saturation: 6 % — CL (ref 15–55)
Iron: 27 ug/dL (ref 27–139)
Lymphocytes Absolute: 1.7 10*3/uL (ref 0.7–3.1)
Lymphs: 22 %
MCH: 29.5 pg (ref 26.6–33.0)
MCHC: 32.4 g/dL (ref 31.5–35.7)
MCV: 91 fL (ref 79–97)
Monocytes Absolute: 0.7 10*3/uL (ref 0.1–0.9)
Monocytes: 9 %
Neutrophils Absolute: 5 10*3/uL (ref 1.4–7.0)
Neutrophils: 67 %
Platelets: 395 10*3/uL (ref 150–450)
RBC: 3.93 x10E6/uL (ref 3.77–5.28)
RDW: 14.3 % (ref 11.7–15.4)
Retic Ct Pct: 2 % (ref 0.6–2.6)
Total Iron Binding Capacity: 461 ug/dL — ABNORMAL HIGH (ref 250–450)
UIBC: 434 ug/dL — ABNORMAL HIGH (ref 118–369)
Vitamin B-12: 182 pg/mL — ABNORMAL LOW (ref 232–1245)
WBC: 7.6 10*3/uL (ref 3.4–10.8)

## 2019-09-10 ENCOUNTER — Telehealth: Payer: Self-pay | Admitting: Family Medicine

## 2019-09-10 ENCOUNTER — Other Ambulatory Visit: Payer: Self-pay | Admitting: *Deleted

## 2019-09-10 DIAGNOSIS — E611 Iron deficiency: Secondary | ICD-10-CM

## 2019-09-10 NOTE — Telephone Encounter (Signed)
Aware of results and referral placed.

## 2019-09-12 LAB — TOXASSURE SELECT 13 (MW), URINE

## 2019-09-19 ENCOUNTER — Ambulatory Visit (HOSPITAL_COMMUNITY): Payer: Medicare Other | Admitting: Hematology

## 2019-09-24 ENCOUNTER — Encounter (HOSPITAL_COMMUNITY): Payer: Self-pay

## 2019-09-25 ENCOUNTER — Inpatient Hospital Stay (HOSPITAL_COMMUNITY): Payer: Medicare Other | Attending: Hematology | Admitting: Hematology

## 2019-09-25 ENCOUNTER — Inpatient Hospital Stay (HOSPITAL_COMMUNITY): Payer: Medicare Other

## 2019-09-25 ENCOUNTER — Encounter (HOSPITAL_COMMUNITY): Payer: Self-pay | Admitting: Hematology

## 2019-09-25 ENCOUNTER — Other Ambulatory Visit (HOSPITAL_COMMUNITY): Payer: Self-pay

## 2019-09-25 ENCOUNTER — Other Ambulatory Visit: Payer: Self-pay

## 2019-09-25 VITALS — BP 148/70 | HR 86 | Temp 97.7°F | Resp 16 | Ht 65.0 in | Wt 167.2 lb

## 2019-09-25 DIAGNOSIS — Z79899 Other long term (current) drug therapy: Secondary | ICD-10-CM | POA: Insufficient documentation

## 2019-09-25 DIAGNOSIS — M810 Age-related osteoporosis without current pathological fracture: Secondary | ICD-10-CM | POA: Diagnosis not present

## 2019-09-25 DIAGNOSIS — I10 Essential (primary) hypertension: Secondary | ICD-10-CM | POA: Diagnosis not present

## 2019-09-25 DIAGNOSIS — Z832 Family history of diseases of the blood and blood-forming organs and certain disorders involving the immune mechanism: Secondary | ICD-10-CM | POA: Diagnosis not present

## 2019-09-25 DIAGNOSIS — R195 Other fecal abnormalities: Secondary | ICD-10-CM

## 2019-09-25 DIAGNOSIS — F17210 Nicotine dependence, cigarettes, uncomplicated: Secondary | ICD-10-CM | POA: Diagnosis not present

## 2019-09-25 DIAGNOSIS — E538 Deficiency of other specified B group vitamins: Secondary | ICD-10-CM

## 2019-09-25 DIAGNOSIS — Z803 Family history of malignant neoplasm of breast: Secondary | ICD-10-CM

## 2019-09-25 DIAGNOSIS — Z23 Encounter for immunization: Secondary | ICD-10-CM | POA: Diagnosis not present

## 2019-09-25 DIAGNOSIS — R140 Abdominal distension (gaseous): Secondary | ICD-10-CM | POA: Diagnosis not present

## 2019-09-25 DIAGNOSIS — Z800 Family history of malignant neoplasm of digestive organs: Secondary | ICD-10-CM | POA: Diagnosis not present

## 2019-09-25 DIAGNOSIS — Z8249 Family history of ischemic heart disease and other diseases of the circulatory system: Secondary | ICD-10-CM | POA: Diagnosis not present

## 2019-09-25 DIAGNOSIS — Z83438 Family history of other disorder of lipoprotein metabolism and other lipidemia: Secondary | ICD-10-CM | POA: Diagnosis not present

## 2019-09-25 DIAGNOSIS — D509 Iron deficiency anemia, unspecified: Secondary | ICD-10-CM | POA: Diagnosis not present

## 2019-09-25 DIAGNOSIS — Z809 Family history of malignant neoplasm, unspecified: Secondary | ICD-10-CM

## 2019-09-25 DIAGNOSIS — Z833 Family history of diabetes mellitus: Secondary | ICD-10-CM | POA: Diagnosis not present

## 2019-09-25 HISTORY — DX: Deficiency of other specified B group vitamins: E53.8

## 2019-09-25 LAB — RETICULOCYTES
Immature Retic Fract: 29.8 % — ABNORMAL HIGH (ref 2.3–15.9)
RBC.: 3.92 MIL/uL (ref 3.87–5.11)
Retic Count, Absolute: 89.8 10*3/uL (ref 19.0–186.0)
Retic Ct Pct: 2.3 % (ref 0.4–3.1)

## 2019-09-25 LAB — LACTATE DEHYDROGENASE: LDH: 184 U/L (ref 98–192)

## 2019-09-25 LAB — CBC WITH DIFFERENTIAL/PLATELET
Abs Immature Granulocytes: 0.03 10*3/uL (ref 0.00–0.07)
Basophils Absolute: 0 10*3/uL (ref 0.0–0.1)
Basophils Relative: 0 %
Eosinophils Absolute: 0.2 10*3/uL (ref 0.0–0.5)
Eosinophils Relative: 2 %
HCT: 37.4 % (ref 36.0–46.0)
Hemoglobin: 11.4 g/dL — ABNORMAL LOW (ref 12.0–15.0)
Immature Granulocytes: 0 %
Lymphocytes Relative: 28 %
Lymphs Abs: 2.3 10*3/uL (ref 0.7–4.0)
MCH: 29.1 pg (ref 26.0–34.0)
MCHC: 30.5 g/dL (ref 30.0–36.0)
MCV: 95.4 fL (ref 80.0–100.0)
Monocytes Absolute: 0.7 10*3/uL (ref 0.1–1.0)
Monocytes Relative: 8 %
Neutro Abs: 5.2 10*3/uL (ref 1.7–7.7)
Neutrophils Relative %: 62 %
Platelets: 344 10*3/uL (ref 150–400)
RBC: 3.92 MIL/uL (ref 3.87–5.11)
RDW: 15.9 % — ABNORMAL HIGH (ref 11.5–15.5)
WBC: 8.4 10*3/uL (ref 4.0–10.5)
nRBC: 0 % (ref 0.0–0.2)

## 2019-09-25 LAB — TSH: TSH: 0.884 u[IU]/mL (ref 0.350–4.500)

## 2019-09-25 MED ORDER — INFLUENZA VAC A&B SA ADJ QUAD 0.5 ML IM PRSY
0.5000 mL | PREFILLED_SYRINGE | Freq: Once | INTRAMUSCULAR | Status: AC
  Administered 2019-09-25: 0.5 mL via INTRAMUSCULAR
  Filled 2019-09-25: qty 0.5

## 2019-09-25 MED ORDER — CYANOCOBALAMIN 1000 MCG/ML IJ SOLN
1000.0000 ug | Freq: Once | INTRAMUSCULAR | Status: AC
  Administered 2019-09-25: 1000 ug via INTRAMUSCULAR

## 2019-09-25 MED ORDER — CYANOCOBALAMIN 1000 MCG/ML IJ SOLN
INTRAMUSCULAR | Status: AC
  Filled 2019-09-25: qty 1

## 2019-09-25 NOTE — Progress Notes (Signed)
CONSULT NOTE  Patient Care Team: Dettinger, Fransisca Kaufmann, MD as PCP - General (Family Medicine) Steffanie Rainwater, DPM as Consulting Physician (Podiatry) Arvella Nigh, MD as Consulting Physician (Obstetrics and Gynecology)  CHIEF COMPLAINTS/PURPOSE OF CONSULTATION:  Iron deficiency state.  HISTORY OF PRESENTING ILLNESS:  Jody Wilson 68 y.o. female is seen in consultation today for further work-up and management of iron deficiency state.  Labs done on 09/07/2019 showed hemoglobin 11.6, MCV 91.  Ferritin was 13 and percent saturation 6.  Folic acid was 3.4.  B12 was 182.  Denies any bleeding per rectum.  She had couple of black stools in the last 2 months.  Colonoscopy was reportedly done 5 to 6 years ago at [facility] gastroenterology in [place].  She was told to have a normal colonoscopy and follow-up in 10 years.  She did report receiving 1 unit of blood transfusion in 1995 when she had a hysterectomy.  Denies any previous history of parenteral iron therapy.  She reportedly took iron tablet for 3 to 4 months in 2019.  Denies any fevers or night sweats.  Has about 3 pound weight gain in the last 6 months.  Denies any tingling or numbness in extremities.  Denies any dyspnea on exertion, chest pain on exertion, presyncopal episodes or palpitations.  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  Denies any over-the-counter NSAID ingestion.  Denies any bleeding including nosebleeds, hematuria or hematochezia.  She is currently retired.  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  Prior to that she worked in [employer] fund[employer] for 26 years.  She is a current active smoker, smokes half pack per day for the past 40 years.  Family history significant for maternal aunt with cancer.  Maternal grandmother had colon cancer.  Maternal niece had breast cancer.  She lives at home with her husband and is able to do all her ADLs IADLs.    MEDICAL HISTORY:  Past Medical History:  Diagnosis Date  . Depression   . Hypertension      SURGICAL HISTORY: Past Surgical History:  Procedure Laterality Date  . ABDOMINAL HYSTERECTOMY    . BACK SURGERY    . CHOLECYSTECTOMY    . FOOT SURGERY     7 in the past  . IR GENERIC HISTORICAL  12/09/2016   IR RADIOLOGIST EVAL & MGMT 12/09/2016 [facility]  . IR GENERIC HISTORICAL  12/10/2016   IR VERTEBROPLASTY CERV/THOR BX INC UNI/BIL INC/INJECT/IMAGING 12/10/2016 [facility]  . TRACHEOSTOMY TUBE PLACEMENT  1982    SOCIAL HISTORY: Social History   Socioeconomic History  . Marital status: Married    Spouse name: Not on file  . Number of children: 3  . Years of education: Not on file  . Highest education level: Not on file  Occupational History  . Occupation: retired  Scientific laboratory technician  . Financial resource strain: Not very hard  . Food insecurity    Worry: Never true    Inability: Never true  . Transportation needs    Medical: No    Non-medical: No  Tobacco Use  . Smoking status: Current Every Day Smoker    Packs/day: 0.25    Types: Cigarettes  . Smokeless tobacco: Never Used  Substance and Sexual Activity  . Alcohol use: Yes    Comment: occassionally   . Drug use: No  . Sexual activity: Not on file  Lifestyle  . Physical activity    Days per week: 2 days    Minutes per session:  20 min  . Stress: Only a little  Relationships  . Social connections    Talks on phone: More than three times a week    Gets together: More than three times a week    Attends religious service: More than 4 times per year    Active member of club or organization: No    Attends meetings of clubs or organizations: Never    Relationship status: Married  . Intimate partner violence    Fear of current or ex partner: No    Emotionally abused: No    Physically abused: No    Forced sexual activity: No  Other Topics Concern  . Not on file  Social History Narrative  . Not on file    FAMILY HISTORY: Family History  Problem Relation Age of Onset  . Heart disease Mother   .  Hypertension Mother   . Hyperlipidemia Mother   . Diabetes Mother   . Congestive Heart Failure Mother   . Hypertension Son   . Other Son 70       Kienbock's Disease  . Liver disease Son 82       Gilbert's Disease  . Aneurysm Paternal Uncle   . Heart attack Maternal Grandmother   . Heart attack Maternal Grandfather   . Lupus Half-Sister     ALLERGIES:  is allergic to tetracyclines & related.  MEDICATIONS:  Current Outpatient Medications  Medication Sig Dispense Refill  . albuterol (PROVENTIL HFA;VENTOLIN HFA) 108 (90 Base) MCG/ACT inhaler Inhale 2 puffs into the lungs every 6 (six) hours as needed for wheezing or shortness of breath. (Patient not taking: Reported on 09/24/2019) 1 Inhaler 2  . ALPRAZolam (XANAX) 0.5 MG tablet Take 1 tablet (0.5 mg total) by mouth 2 (two) times daily as needed for anxiety. (Patient not taking: Reported on 09/24/2019) 60 tablet 2  . calcium-vitamin D (OSCAL WITH D) 500-200 MG-UNIT tablet Take 1 tablet by mouth 2 (two) times daily.     . citalopram (CELEXA) 40 MG tablet Take 1 tablet (40 mg total) by mouth daily. 90 tablet 3  . clobetasol (TEMOVATE) 0.05 % external solution Apply 1 application topically 2 (two) times daily. (Patient not taking: Reported on 09/24/2019) 50 mL 3  . denosumab (PROLIA) 60 MG/ML SOSY injection Inject 60 mg into the skin every 6 (six) months. 2 mL 0  . hydrochlorothiazide (MICROZIDE) 12.5 MG capsule Take 1 capsule (12.5 mg total) by mouth daily. 90 capsule 3  . mirtazapine (REMERON) 15 MG tablet Take 1 tablet (15 mg total) by mouth at bedtime. 90 tablet 3  . mupirocin ointment (BACTROBAN) 2 % Place 1 application into the nose 2 (two) times daily. (Patient not taking: Reported on 09/24/2019) 22 g 0  . olmesartan (BENICAR) 40 MG tablet Take 1 tablet (40 mg total) by mouth daily. 90 tablet 3  . pantoprazole (PROTONIX) 40 MG tablet Take 1 tablet (40 mg total) by mouth daily. 90 tablet 3  . tiZANidine (ZANAFLEX) 4 MG capsule Take 1 capsule  (4 mg total) by mouth 3 (three) times daily. (Patient not taking: Reported on 09/24/2019) 90 capsule 2  . valACYclovir (VALTREX) 1000 MG tablet Take 1 tablet (1,000 mg total) by mouth 2 (two) times daily. (Patient not taking: Reported on 09/24/2019) 20 tablet 3   Current Facility-Administered Medications  Medication Dose Route Frequency Provider Last Rate Last Dose  . denosumab (PROLIA) injection 60 mg  60 mg Subcutaneous Q6 months Dettinger, Fransisca Kaufmann, MD   [phone]  mg at 04/26/19 1444    REVIEW OF SYSTEMS:   Constitutional: Denies fevers, chills or abnormal night sweats Eyes: Denies blurriness of vision, double vision or watery eyes Ears, nose, mouth, throat, and face: Denies mucositis or sore throat Respiratory: Denies cough, dyspnea or wheezes Cardiovascular: Denies palpitation, chest discomfort or lower extremity swelling Gastrointestinal: Bowel movement after eating present.  Abdominal distention positive. Skin: Denies abnormal skin rashes Lymphatics: Denies new lymphadenopathy or easy bruising Neurological:Denies numbness, tingling or new weaknesses Behavioral/Psych: Mood is stable, no new changes  All other systems were reviewed with the patient and are negative.  PHYSICAL EXAMINATION: ECOG PERFORMANCE STATUS: 1 - Symptomatic but completely ambulatory  Vitals:   09/25/19 1300  BP: (!) 148/70  Pulse: 86  Resp: 16  Temp: 97.7 F (36.5 C)  SpO2: 99%   Filed Weights   09/25/19 1300  Weight: 167 lb 4 oz (75.9 kg)    GENERAL:alert, no distress and comfortable SKIN: skin color, texture, turgor are normal, no rashes or significant lesions EYES: normal, conjunctiva are pink and non-injected, sclera clear OROPHARYNX:no exudate, no erythema and lips, buccal mucosa, and tongue normal  NECK: supple, thyroid normal size, non-tender, without nodularity LYMPH:  no palpable lymphadenopathy in the cervical, axillary or inguinal LUNGS: clear to auscultation and percussion with normal  breathing effort HEART: regular rate & rhythm and no murmurs and no lower extremity edema ABDOMEN:abdomen soft, non-tender and normal bowel sounds Musculoskeletal:no cyanosis of digits and no clubbing  PSYCH: alert & oriented x 3 with fluent speech NEURO: no focal motor/sensory deficits  LABORATORY DATA:  I have reviewed the data as listed Recent Results (from the past 2160 hour(s))  CMP14+EGFR     Status: Abnormal   Collection Time: 09/07/19  9:03 AM  Result Value Ref Range   Glucose 106 (H) 65 - 99 mg/dL   BUN 13 8 - 27 mg/dL   Creatinine, Ser 0.84 0.57 - 1.00 mg/dL   GFR calc non Af Amer 72 >59 mL/min/1.73   GFR calc Af Amer 83 >59 mL/min/1.73   BUN/Creatinine Ratio 15 12 - 28   Sodium 137 134 - 144 mmol/L   Potassium 4.1 3.5 - 5.2 mmol/L   Chloride 98 96 - 106 mmol/L   CO2 26 20 - 29 mmol/L   Calcium 8.9 8.7 - 10.3 mg/dL   Total Protein 6.6 6.0 - 8.5 g/dL   Albumin 3.8 3.8 - 4.8 g/dL   Globulin, Total 2.8 1.5 - 4.5 g/dL   Albumin/Globulin Ratio 1.4 1.2 - 2.2   Bilirubin Total 0.3 0.0 - 1.2 mg/dL   Alkaline Phosphatase 87 39 - 117 IU/L   AST 37 0 - 40 IU/L   ALT 33 (H) 0 - 32 IU/L  ToxASSURE Select 13 (MW), Urine     Status: None   Collection Time: 09/07/19  9:03 AM  Result Value Ref Range   Summary Note     Comment: ==================================================================== ToxASSURE Select 13 (MW) ==================================================================== Test                             Result       Flag       Units Drug Present and Declared for Prescription Verification   Alprazolam                     64           EXPECTED   ng/mg creat  Alpha-hydroxyalprazolam        157          EXPECTED   ng/mg creat    Source of alprazolam is a scheduled prescription medication. Alpha-    hydroxyalprazolam is an expected metabolite of alprazolam. ==================================================================== Test                      Result    Flag    Units      Ref Range   Creatinine              167              mg/dL      >=20 ==================================================================== Declared Medications:  The flagging and interpretation on this report are based on the  following declared medications.  Unexpected results may arise from  inaccuracies i n the declared medications.  **Note: The testing scope of this panel includes these medications:  Alprazolam ==================================================================== For clinical consultation, please call [phone]. ====================================================================   Anemia Profile B     Status: Abnormal   Collection Time: 09/07/19  9:03 AM  Result Value Ref Range   Total Iron Binding Capacity 461 (H) 250 - 450 ug/dL   UIBC 434 (H) 118 - 369 ug/dL   Iron 27 27 - 139 ug/dL   Iron Saturation 6 (LL) 15 - 55 %   Ferritin 13 (L) 15 - 150 ng/mL   Vitamin B-12 182 (L) 232 - 1,245 pg/mL   Folate 3.4 >3.0 ng/mL    Comment: A serum folate concentration of less than 3.1 ng/mL is considered to represent clinical deficiency.    WBC 7.6 3.4 - 10.8 x10E3/uL   RBC 3.93 3.77 - 5.28 x10E6/uL   Hemoglobin 11.6 11.1 - 15.9 g/dL   Hematocrit 35.8 34.0 - 46.6 %   MCV 91 79 - 97 fL   MCH 29.5 26.6 - 33.0 pg   MCHC 32.4 31.5 - 35.7 g/dL   RDW 14.3 11.7 - 15.4 %   Platelets 395 150 - 450 x10E3/uL   Neutrophils 67 Not Estab. %   Lymphs 22 Not Estab. %   Monocytes 9 Not Estab. %   Eos 1 Not Estab. %   Basos 1 Not Estab. %   Neutrophils Absolute 5.0 1.4 - 7.0 x10E3/uL   Lymphocytes Absolute 1.7 0.7 - 3.1 x10E3/uL   Monocytes Absolute 0.7 0.1 - 0.9 x10E3/uL   EOS (ABSOLUTE) 0.1 0.0 - 0.4 x10E3/uL   Basophils Absolute 0.1 0.0 - 0.2 x10E3/uL   Immature Granulocytes 0 Not Estab. %   Immature Grans (Abs) 0.0 0.0 - 0.1 x10E3/uL   Retic Ct Pct 2.0 0.6 - 2.6 %    RADIOGRAPHIC STUDIES: I have personally reviewed the radiological images as listed and  agreed with the findings in the report.  ASSESSMENT & PLAN:  Iron deficiency anemia 1.  Normocytic anemia: - CBC on 09/07/2019 shows hemoglobin 11.6, MCV 91.  Her baseline hemoglobin is in the range of 14. - Ferritin is 13 with percent saturation of 6.  Folic acid was 3.4 and B12 was 182. -Patient reported having been on iron pills for at least 3 to 4 months last year.  Denies any bleeding per rectum.  Had 2 black stools in the last 2 months. -Reports severe decrease in energy.  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 -Colonoscopy was reportedly 5 to 6 years ago done at [facility] gastroenterology in [place].  She was told that it was normal and to  have repeat colonoscopy in 10 years. - She reported receiving 1 unit of blood transfusion when she had a hysterectomy 1995. - Because of her severely low energy levels and low ferritin, I have recommended parenteral iron therapy for quicker response. -We discussed the side effects in detail including rare chance of anaphylactic reactions.  We will schedule her for Feraheme weekly x2. - She will also receive vitamin B12 injection today.  She was told to take vitamin B12 tablet 1 mg daily.  We will check LDH, reticulocyte count, methylmalonic acid, copper and SPEP today. -She will be seen back in 6 weeks with repeat CBC, ferritin, iron panel, and B12.  2.  Osteoporosis: - She has used Forteo in the past.  She is currently receiving Prolia injections every 6 months. -DEXA scan on 01/31/2017 shows T score of -2.9.  3.  Abdominal bloating and mucus in stools: -She reports decreased appetite and abdominal distention after eating.  She did not lose any weight.  She gained about 3 pounds in the last few months. -She also reports having a bowel movement after each meal, usually mucousy, seen in the last 6 months. - If her symptoms does not improve after repletion of iron, will consider GI evaluation.  4.  Tobacco abuse: -She is current active smoker, smokes half pack per day for  40 years.     All questions were answered. The patient knows to call the clinic with any problems, questions or concerns.      Derek Jack, MD 09/25/19 2:27 PM

## 2019-09-25 NOTE — Patient Instructions (Addendum)
[place] at [facility] Discharge Instructions  You were seen today by Dr. Delton [place]ombes. He went over your history, family history and how you've been feeling lately. He will have blood drawn today. He will see you back in 6 weeks for follow up.   Thank you for choosing [place] at [facility] to provide your oncology and hematology care.  To afford each patient quality time with our provider, please arrive at least 15 minutes before your scheduled appointment time.   If you have a lab appointment with the [facility] please come in thru the  Main Entrance and check in at the main information desk  You need to re-schedule your appointment should you arrive 10 or more minutes late.  We strive to give you quality time with our providers, and arriving late affects you and other patients whose appointments are after yours.  Also, if you no show three or more times for appointments you may be dismissed from the clinic at the providers discretion.     Again, thank you for choosing [facility]  Our hope is that these requests will decrease the amount of time that you wait before being seen by our physicians.       _____________________________________________________________  Should you have questions after your visit to [facility], please contact our office at [phone] between the hours of 8:00 a.m. and 4:30 p.m.  Voicemails left after 4:00 p.m. will not be returned until the following business day.  For prescription refill requests, have your pharmacy contact our office and allow 72 hours.    Cancer Center Support Programs:   > Cancer Support Group  2nd Tuesday of the month 1pm-2pm, Journey Room

## 2019-09-25 NOTE — Assessment & Plan Note (Addendum)
1.  Normocytic anemia: - CBC on 09/07/2019 shows hemoglobin 11.6, MCV 91.  Her baseline hemoglobin is in the range of 14. - Ferritin is 13 with percent saturation of 6.  Folic acid was 3.4 and B12 was 182. -Patient reported having been on iron pills for at least 3 to 4 months last year.  Denies any bleeding per rectum.  Had 2 black stools in the last 2 months. -Reports severe decrease in energy.  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 -Colonoscopy was reportedly 5 to 6 years ago done at [facility] gastroenterology in [place].  She was told that it was normal and to have repeat colonoscopy in 10 years. - She reported receiving 1 unit of blood transfusion when she had a hysterectomy 1995. - Because of her severely low energy levels and low ferritin, I have recommended parenteral iron therapy for quicker response. -We discussed the side effects in detail including rare chance of anaphylactic reactions.  We will schedule her for Feraheme weekly x2. - She will also receive vitamin B12 injection today.  She was told to take vitamin B12 tablet 1 mg daily.  We will check LDH, reticulocyte count, methylmalonic acid, copper and SPEP today. -She will be seen back in 6 weeks with repeat CBC, ferritin, iron panel, and B12.  2.  Osteoporosis: - She has used Forteo in the past.  She is currently receiving Prolia injections every 6 months. -DEXA scan on 01/31/2017 shows T score of -2.9.  3.  Abdominal bloating and mucus in stools: -She reports decreased appetite and abdominal distention after eating.  She did not lose any weight.  She gained about 3 pounds in the last few months. -She also reports having a bowel movement after each meal, usually mucousy, seen in the last 6 months. - If her symptoms does not improve after repletion of iron, will consider GI evaluation.  4.  Tobacco abuse: -She is current active smoker, smokes half pack per day for 40 years.

## 2019-09-26 ENCOUNTER — Encounter: Payer: Self-pay | Admitting: Neurology

## 2019-09-26 ENCOUNTER — Ambulatory Visit (INDEPENDENT_AMBULATORY_CARE_PROVIDER_SITE_OTHER): Payer: Medicare Other | Admitting: Neurology

## 2019-09-26 DIAGNOSIS — G253 Myoclonus: Secondary | ICD-10-CM | POA: Diagnosis not present

## 2019-09-26 LAB — PROTEIN ELECTROPHORESIS, SERUM
A/G Ratio: 1 (ref 0.7–1.7)
Albumin ELP: 3.4 g/dL (ref 2.9–4.4)
Alpha-1-Globulin: 0.2 g/dL (ref 0.0–0.4)
Alpha-2-Globulin: 0.9 g/dL (ref 0.4–1.0)
Beta Globulin: 1 g/dL (ref 0.7–1.3)
Gamma Globulin: 1.3 g/dL (ref 0.4–1.8)
Globulin, Total: 3.4 g/dL (ref 2.2–3.9)
Total Protein ELP: 6.8 g/dL (ref 6.0–8.5)

## 2019-09-26 NOTE — Progress Notes (Signed)
Reason for visit: Facial twitching  Referring physician: Dr. Harolyn Rutherford is a 68 y.o. female  History of present illness:  Jody Wilson is a 68 year old right-handed white female with a 10 to 15-year history of intermittent episodes of jerks of the jaw and face that may occur mainly at nighttime.  In the past, she has noted severe exacerbations of her symptoms while taking Wellbutrin and Zoloft combination.  She has had a baseline frequency in the past of an episode or 2 every month or so, but recently they have become much more frequent, occurring 3-4 times at night.  She has noted that lack of sleep or stress may make the episodes worse.  She currently is on a combination of mirtazapine and Celexa, she cut back on the Celexa dose from 40 mg at day to 20 mg a day because the higher dose of Celexa worsens her symptoms.  She recently was found to have an iron deficiency, she has been craving ice recently.  She also has B12 deficiency, she got her first B12 injection yesterday.  She will be getting IV iron through hematology in the near future.  She will take Benadryl at night which seems to help.  At times, the jaw jerks are severe enough that she may bite her inner cheeks.  Several years ago when using Zoloft and Wellbutrin combination, the jerks also affected arms and legs, this is not the case currently.  The patient rarely has jerks during the daytime.  She is sent to this office for an evaluation.  She reports no numbness or weakness of the face, arms, legs.  She does note some mild gait instability, but she has not had any falls.  She drinks an occasional tea, she may drink 1 Dr. Malachi Wilson a day.  Past Medical History:  Diagnosis Date  . Depression   . Hypertension     Past Surgical History:  Procedure Laterality Date  . ABDOMINAL HYSTERECTOMY    . BACK SURGERY    . CHOLECYSTECTOMY    . FOOT SURGERY     7 in the past  . IR GENERIC HISTORICAL  12/09/2016   IR RADIOLOGIST  EVAL & MGMT 12/09/2016 [facility]  . IR GENERIC HISTORICAL  12/10/2016   IR VERTEBROPLASTY CERV/THOR BX INC UNI/BIL INC/INJECT/IMAGING 12/10/2016 [facility]  . TRACHEOSTOMY TUBE PLACEMENT  1982    Family History  Problem Relation Age of Onset  . Heart disease Mother   . Hypertension Mother   . Hyperlipidemia Mother   . Diabetes Mother   . Congestive Heart Failure Mother   . Hypertension Son   . Other Son 53       Kienbock's Disease  . Liver disease Son 88       Gilbert's Disease  . Aneurysm Paternal Uncle   . Heart attack Maternal Grandmother   . Heart attack Maternal Grandfather   . Lupus Half-Sister     Social history:  reports that she has been smoking cigarettes. She has been smoking about 0.25 packs per day. She has never used smokeless tobacco. She reports current alcohol use. She reports that she does not use drugs.  Medications:  Prior to Admission medications   Medication Sig Start Date End Date Taking? Authorizing Provider  albuterol (PROVENTIL HFA;VENTOLIN HFA) 108 (90 Base) MCG/ACT inhaler Inhale 2 puffs into the lungs every 6 (six) hours as needed for wheezing or shortness of breath. 03/31/18  Yes Dettinger, Fransisca Kaufmann, MD  ALPRAZolam (XANAX) 0.5 MG tablet Take 1 tablet (0.5 mg total) by mouth 2 (two) times daily as needed for anxiety. 09/07/19  Yes Dettinger, Fransisca Kaufmann, MD  calcium-vitamin D (OSCAL WITH D) 500-200 MG-UNIT tablet Take 1 tablet by mouth 2 (two) times daily.    Yes [provider]  citalopram (CELEXA) 40 MG tablet Take 1 tablet (40 mg total) by mouth daily. Patient taking differently: Take 40 mg by mouth daily. 1/2 tab per day 05/30/19  Yes Dettinger, Fransisca Kaufmann, MD  clobetasol (TEMOVATE) 0.05 % external solution Apply 1 application topically 2 (two) times daily. 03/15/18  Yes Dettinger, Fransisca Kaufmann, MD  denosumab (PROLIA) 60 MG/ML SOSY injection Inject 60 mg into the skin every 6 (six) months. 07/26/18  Yes Dettinger, Fransisca Kaufmann, MD  hydrochlorothiazide  (MICROZIDE) 12.5 MG capsule Take 1 capsule (12.5 mg total) by mouth daily. 11/24/18  Yes Dettinger, Fransisca Kaufmann, MD  mirtazapine (REMERON) 15 MG tablet Take 1 tablet (15 mg total) by mouth at bedtime. 05/30/19  Yes Dettinger, Fransisca Kaufmann, MD  mupirocin ointment (BACTROBAN) 2 % Place 1 application into the nose 2 (two) times daily. 11/24/18  Yes Dettinger, Fransisca Kaufmann, MD  olmesartan (BENICAR) 40 MG tablet Take 1 tablet (40 mg total) by mouth daily. Patient taking differently: Take 40 mg by mouth daily. 1/2 tab daily 05/30/19  Yes Dettinger, Fransisca Kaufmann, MD  pantoprazole (PROTONIX) 40 MG tablet Take 1 tablet (40 mg total) by mouth daily. 11/24/18  Yes Dettinger, Fransisca Kaufmann, MD  tiZANidine (ZANAFLEX) 4 MG capsule Take 1 capsule (4 mg total) by mouth 3 (three) times daily. 07/26/18  Yes Dettinger, Fransisca Kaufmann, MD  valACYclovir (VALTREX) 1000 MG tablet Take 1 tablet (1,000 mg total) by mouth 2 (two) times daily. 11/24/18  Yes Dettinger, Fransisca Kaufmann, MD      Allergies  Allergen Reactions  . Tetracyclines & Related Itching and Swelling    ROS:  Out of a complete 14 system review of symptoms, the patient complains only of the following symptoms, and all other reviewed systems are negative.  Facial jerks Craving ice  Blood pressure (!) 159/84, pulse 67, temperature (!) 97.1 F (36.2 C), temperature source Temporal, height 5\' 5"  (1.651 m), weight 167 lb 5 oz (75.9 kg).  Physical Exam  General: The patient is alert and cooperative at the time of the examination.  Eyes: Pupils are equal, round, and reactive to light. Discs are flat bilaterally.  Neck: The neck is supple, no carotid bruits are noted.  Respiratory: The respiratory examination is clear.  Cardiovascular: The cardiovascular examination reveals a regular rate and rhythm, no obvious murmurs or rubs are noted.  Skin: Extremities are without significant edema.  Neurologic Exam  Mental status: The patient is alert and oriented x 3 at the time of the  examination. The patient has apparent normal recent and remote memory, with an apparently normal attention span and concentration ability.  Cranial nerves: Facial symmetry is present. There is good sensation of the face to pinprick and soft touch bilaterally. The strength of the facial muscles and the muscles to head turning and shoulder shrug are normal bilaterally. Speech is well enunciated, no aphasia or dysarthria is noted. Extraocular movements are full. Visual fields are full. The tongue is midline, and the patient has symmetric elevation of the soft palate. No obvious hearing deficits are noted.  Motor: The motor testing reveals 5 over 5 strength of all 4 extremities. Good symmetric motor tone is noted throughout.  Sensory:  Sensory testing is intact to pinprick, soft touch, vibration sensation, and position sense on all 4 extremities. No evidence of extinction is noted.  Coordination: Cerebellar testing reveals good finger-nose-finger and heel-to-shin bilaterally.  Gait and station: Gait is normal. Tandem gait is normal. Romberg is negative. No drift is seen.  Reflexes: Deep tendon reflexes are symmetric and normal bilaterally. Toes are downgoing bilaterally.   Assessment/Plan:  1.  Facial jerks, nocturnal  2.  Iron deficiency  3.  Vitamin B12 deficiency  The patient has clearly noted an association with the SSRI antidepressant medications and her clinical symptoms of facial jerking, these medications can promote periodic limb movements at night, but the patient is having unusual episodes of jaw jerking, and she may even bite the inner portion of her cheeks.  The patient will be taken off of mirtazapine for now, it is possible that the treatment with IV iron may improve her symptoms.  If she is no better in the next 6 weeks or so, she may contact our office, otherwise she will follow-up here in 4 months.  Jody Alexanders MD 09/26/2019 11:03 AM  [facility] [address] [place], [place] [address]  Phone [phone] Fax [phone]

## 2019-09-26 NOTE — Patient Instructions (Signed)
Stop the mirtazepine. We will follow up after the treatment with IV iron and vitamin b12.

## 2019-09-27 ENCOUNTER — Ambulatory Visit (HOSPITAL_COMMUNITY): Payer: Medicare Other

## 2019-09-27 LAB — COPPER, SERUM: Copper: 129 ug/dL (ref 72–166)

## 2019-09-28 ENCOUNTER — Other Ambulatory Visit: Payer: Self-pay

## 2019-09-28 ENCOUNTER — Encounter (HOSPITAL_COMMUNITY): Payer: Self-pay

## 2019-09-28 ENCOUNTER — Inpatient Hospital Stay (HOSPITAL_COMMUNITY): Payer: Medicare Other

## 2019-09-28 VITALS — BP 147/60 | HR 64 | Temp 96.8°F | Resp 18

## 2019-09-28 DIAGNOSIS — R140 Abdominal distension (gaseous): Secondary | ICD-10-CM | POA: Diagnosis not present

## 2019-09-28 DIAGNOSIS — D509 Iron deficiency anemia, unspecified: Secondary | ICD-10-CM

## 2019-09-28 DIAGNOSIS — I10 Essential (primary) hypertension: Secondary | ICD-10-CM | POA: Diagnosis not present

## 2019-09-28 DIAGNOSIS — Z23 Encounter for immunization: Secondary | ICD-10-CM | POA: Diagnosis not present

## 2019-09-28 DIAGNOSIS — E538 Deficiency of other specified B group vitamins: Secondary | ICD-10-CM | POA: Diagnosis not present

## 2019-09-28 DIAGNOSIS — M810 Age-related osteoporosis without current pathological fracture: Secondary | ICD-10-CM | POA: Diagnosis not present

## 2019-09-28 MED ORDER — SODIUM CHLORIDE 0.9 % IV SOLN
510.0000 mg | Freq: Once | INTRAVENOUS | Status: AC
  Administered 2019-09-28: 510 mg via INTRAVENOUS
  Filled 2019-09-28: qty 510

## 2019-09-28 MED ORDER — SODIUM CHLORIDE 0.9 % IV SOLN
Freq: Once | INTRAVENOUS | Status: AC
  Administered 2019-09-28: 09:00:00 via INTRAVENOUS

## 2019-09-28 NOTE — Patient Instructions (Signed)
[facility] at [facility] Discharge Instructions  Received Feraheme infusion today. Follow-up as scheduled. Call clinic for any questions or concerns   Thank you for choosing [facility] at [facility] to provide your oncology and hematology care.  To afford each patient quality time with our provider, please arrive at least 15 minutes before your scheduled appointment time.   If you have a lab appointment with the Cancer Center please come in thru the Main Entrance and check in at the main information desk.  You need to re-schedule your appointment should you arrive 10 or more minutes late.  We strive to give you quality time with our providers, and arriving late affects you and other patients whose appointments are after yours.  Also, if you no show three or more times for appointments you may be dismissed from the clinic at the providers discretion.     Again, thank you for choosing [facility].  Our hope is that these requests will decrease the amount of time that you wait before being seen by our physicians.       _____________________________________________________________  Should you have questions after your visit to [facility], please contact our office at [phone] between the hours of 8:00 a.m. and 4:30 p.m.  Voicemails left after 4:00 p.m. will not be returned until the following business day.  For prescription refill requests, have your pharmacy contact our office and allow 72 hours.    Due to Covid, you will need to wear a mask upon entering the hospital. If you do not have a mask, a mask will be given to you at the Main Entrance upon arrival. For doctor visits, patients may have 1 support person with them. For treatment visits, patients can not have anyone with them due to social distancing guidelines and our immunocompromised population.     

## 2019-09-28 NOTE — Progress Notes (Signed)
[name] tolerated Feraheme infusion well without complaints or incident. VSS upon discharge. Pt discharged self ambulatory in satisfactory condition

## 2019-10-05 ENCOUNTER — Encounter (HOSPITAL_COMMUNITY): Payer: Self-pay

## 2019-10-05 ENCOUNTER — Inpatient Hospital Stay (HOSPITAL_COMMUNITY): Payer: Medicare Other

## 2019-10-05 ENCOUNTER — Other Ambulatory Visit: Payer: Self-pay

## 2019-10-05 VITALS — BP 142/76 | HR 58 | Temp 96.8°F | Resp 18

## 2019-10-05 DIAGNOSIS — E538 Deficiency of other specified B group vitamins: Secondary | ICD-10-CM | POA: Diagnosis not present

## 2019-10-05 DIAGNOSIS — M810 Age-related osteoporosis without current pathological fracture: Secondary | ICD-10-CM | POA: Diagnosis not present

## 2019-10-05 DIAGNOSIS — D509 Iron deficiency anemia, unspecified: Secondary | ICD-10-CM | POA: Diagnosis not present

## 2019-10-05 DIAGNOSIS — I10 Essential (primary) hypertension: Secondary | ICD-10-CM | POA: Diagnosis not present

## 2019-10-05 DIAGNOSIS — R140 Abdominal distension (gaseous): Secondary | ICD-10-CM | POA: Diagnosis not present

## 2019-10-05 DIAGNOSIS — Z23 Encounter for immunization: Secondary | ICD-10-CM | POA: Diagnosis not present

## 2019-10-05 LAB — METHYLMALONIC ACID, SERUM: Methylmalonic Acid, Quantitative: 244 nmol/L (ref 0–378)

## 2019-10-05 MED ORDER — SODIUM CHLORIDE 0.9 % IV SOLN
Freq: Once | INTRAVENOUS | Status: AC
  Administered 2019-10-05: 11:00:00 via INTRAVENOUS

## 2019-10-05 MED ORDER — SODIUM CHLORIDE 0.9 % IV SOLN
510.0000 mg | Freq: Once | INTRAVENOUS | Status: AC
  Administered 2019-10-05: 11:00:00 510 mg via INTRAVENOUS
  Filled 2019-10-05: qty 510

## 2019-10-05 NOTE — Patient Instructions (Signed)
[facility] at [facility]  Discharge Instructions:   _______________________________________________________________  Thank you for choosing [facility] at [facility] to provide your oncology and hematology care.  To afford each patient quality time with our providers, please arrive at least 15 minutes before your scheduled appointment.  You need to re-schedule your appointment if you arrive 10 or more minutes late.  We strive to give you quality time with our providers, and arriving late affects you and other patients whose appointments are after yours.  Also, if you no show three or more times for appointments you may be dismissed from the clinic.  Again, thank you for choosing [facility] at [facility]. Our hope is that these requests will allow you access to exceptional care and in a timely manner. _______________________________________________________________  If you have questions after your visit, please contact our office at [phone] between the hours of 8:30 a.m. and 5:00 p.m. Voicemails left after 4:30 p.m. will not be returned until the following business day. _______________________________________________________________  For prescription refill requests, have your pharmacy contact our office. _______________________________________________________________  Recommendations made by the consultant and any test results will be sent to your referring physician. _______________________________________________________________ 

## 2019-10-05 NOTE — Progress Notes (Signed)
Pt presents today for Feraheme infusion. Pt has no complaints of any changes since the last visit. MAR reviewed and updated. VSS.   Feraheme tx #2 given today per MD orders. Tolerated infusion without adverse affects. Vital signs stable. No complaints at this time. Discharged from clinic ambulatory. F/U with [facility] as scheduled.

## 2019-10-16 ENCOUNTER — Ambulatory Visit (HOSPITAL_COMMUNITY): Payer: Medicare Other | Admitting: Hematology

## 2019-10-29 ENCOUNTER — Other Ambulatory Visit (HOSPITAL_COMMUNITY): Payer: Self-pay | Admitting: *Deleted

## 2019-10-29 DIAGNOSIS — E538 Deficiency of other specified B group vitamins: Secondary | ICD-10-CM

## 2019-10-29 DIAGNOSIS — D509 Iron deficiency anemia, unspecified: Secondary | ICD-10-CM

## 2019-10-30 ENCOUNTER — Other Ambulatory Visit: Payer: Self-pay

## 2019-10-30 ENCOUNTER — Inpatient Hospital Stay (HOSPITAL_COMMUNITY): Payer: Medicare Other | Attending: Hematology

## 2019-10-30 DIAGNOSIS — Z79899 Other long term (current) drug therapy: Secondary | ICD-10-CM | POA: Diagnosis not present

## 2019-10-30 DIAGNOSIS — Z833 Family history of diabetes mellitus: Secondary | ICD-10-CM | POA: Diagnosis not present

## 2019-10-30 DIAGNOSIS — R253 Fasciculation: Secondary | ICD-10-CM | POA: Diagnosis not present

## 2019-10-30 DIAGNOSIS — D509 Iron deficiency anemia, unspecified: Secondary | ICD-10-CM

## 2019-10-30 DIAGNOSIS — F17210 Nicotine dependence, cigarettes, uncomplicated: Secondary | ICD-10-CM | POA: Diagnosis not present

## 2019-10-30 DIAGNOSIS — G479 Sleep disorder, unspecified: Secondary | ICD-10-CM | POA: Insufficient documentation

## 2019-10-30 DIAGNOSIS — Z8379 Family history of other diseases of the digestive system: Secondary | ICD-10-CM | POA: Diagnosis not present

## 2019-10-30 DIAGNOSIS — R5383 Other fatigue: Secondary | ICD-10-CM | POA: Diagnosis not present

## 2019-10-30 DIAGNOSIS — Z832 Family history of diseases of the blood and blood-forming organs and certain disorders involving the immune mechanism: Secondary | ICD-10-CM | POA: Insufficient documentation

## 2019-10-30 DIAGNOSIS — Z83438 Family history of other disorder of lipoprotein metabolism and other lipidemia: Secondary | ICD-10-CM | POA: Diagnosis not present

## 2019-10-30 DIAGNOSIS — E538 Deficiency of other specified B group vitamins: Secondary | ICD-10-CM | POA: Diagnosis not present

## 2019-10-30 DIAGNOSIS — Z8249 Family history of ischemic heart disease and other diseases of the circulatory system: Secondary | ICD-10-CM | POA: Diagnosis not present

## 2019-10-30 DIAGNOSIS — M810 Age-related osteoporosis without current pathological fracture: Secondary | ICD-10-CM | POA: Insufficient documentation

## 2019-10-30 DIAGNOSIS — I10 Essential (primary) hypertension: Secondary | ICD-10-CM | POA: Insufficient documentation

## 2019-10-30 DIAGNOSIS — R519 Headache, unspecified: Secondary | ICD-10-CM | POA: Diagnosis not present

## 2019-10-30 LAB — IRON AND TIBC
Iron: 175 ug/dL — ABNORMAL HIGH (ref 28–170)
Saturation Ratios: 44 % — ABNORMAL HIGH (ref 10.4–31.8)
TIBC: 401 ug/dL (ref 250–450)
UIBC: 226 ug/dL

## 2019-10-30 LAB — VITAMIN B12: Vitamin B-12: 487 pg/mL (ref 180–914)

## 2019-10-30 LAB — CBC WITH DIFFERENTIAL/PLATELET
Abs Immature Granulocytes: 0.02 10*3/uL (ref 0.00–0.07)
Basophils Absolute: 0 10*3/uL (ref 0.0–0.1)
Basophils Relative: 0 %
Eosinophils Absolute: 0.1 10*3/uL (ref 0.0–0.5)
Eosinophils Relative: 2 %
HCT: 48.9 % — ABNORMAL HIGH (ref 36.0–46.0)
Hemoglobin: 15.8 g/dL — ABNORMAL HIGH (ref 12.0–15.0)
Immature Granulocytes: 0 %
Lymphocytes Relative: 21 %
Lymphs Abs: 1.4 10*3/uL (ref 0.7–4.0)
MCH: 33.1 pg (ref 26.0–34.0)
MCHC: 32.3 g/dL (ref 30.0–36.0)
MCV: 102.3 fL — ABNORMAL HIGH (ref 80.0–100.0)
Monocytes Absolute: 0.6 10*3/uL (ref 0.1–1.0)
Monocytes Relative: 8 %
Neutro Abs: 4.7 10*3/uL (ref 1.7–7.7)
Neutrophils Relative %: 69 %
Platelets: 277 10*3/uL (ref 150–400)
RBC: 4.78 MIL/uL (ref 3.87–5.11)
RDW: 20.3 % — ABNORMAL HIGH (ref 11.5–15.5)
WBC: 6.8 10*3/uL (ref 4.0–10.5)
nRBC: 0 % (ref 0.0–0.2)

## 2019-10-30 LAB — FOLATE: Folate: 6.1 ng/mL (ref >5.9)

## 2019-10-30 LAB — FERRITIN: Ferritin: 72 ng/mL (ref 11–307)

## 2019-11-06 ENCOUNTER — Inpatient Hospital Stay (HOSPITAL_BASED_OUTPATIENT_CLINIC_OR_DEPARTMENT_OTHER): Payer: Medicare Other | Admitting: Hematology

## 2019-11-06 ENCOUNTER — Encounter (HOSPITAL_COMMUNITY): Payer: Self-pay | Admitting: Hematology

## 2019-11-06 ENCOUNTER — Other Ambulatory Visit: Payer: Self-pay

## 2019-11-06 VITALS — BP 149/86 | HR 85 | Temp 97.1°F | Resp 20 | Wt 160.8 lb

## 2019-11-06 DIAGNOSIS — D509 Iron deficiency anemia, unspecified: Secondary | ICD-10-CM

## 2019-11-06 DIAGNOSIS — R5383 Other fatigue: Secondary | ICD-10-CM | POA: Diagnosis not present

## 2019-11-06 DIAGNOSIS — R253 Fasciculation: Secondary | ICD-10-CM | POA: Diagnosis not present

## 2019-11-06 DIAGNOSIS — E538 Deficiency of other specified B group vitamins: Secondary | ICD-10-CM | POA: Diagnosis not present

## 2019-11-06 DIAGNOSIS — R519 Headache, unspecified: Secondary | ICD-10-CM | POA: Diagnosis not present

## 2019-11-06 DIAGNOSIS — M810 Age-related osteoporosis without current pathological fracture: Secondary | ICD-10-CM | POA: Diagnosis not present

## 2019-11-06 NOTE — Assessment & Plan Note (Signed)
1.  Normocytic anemia: -CBC on 09/07/2019 shows hemoglobin 11.6, MCV 91, ferritin 13, percent saturation of 6, folic acid 3.4, [id number]. -Colonoscopy reportedly 5 to 6 years ago at [facility] gastroenterology in [place].  She was told that it was normal and to have repeat colonoscopy in 10 years. -She received Feraheme on 09/28/2019 and 10/05/2019.  Denied any major improvement in the energy levels. -SPEP was negative.  We reviewed repeat blood work which showed hemoglobin 15.8.  MCV is 102.3.  Ferritin improved to 72 and percent saturation 44.  2.  Folic acid deficiency: -She will continue folic acid 1 mg tablet daily.  Latest folic acid is 6.1, up from 3.4 previously.  3.  Vitamin B12 deficiency: -B12 level is 487 up from 182.  She will continue B12 1 mg daily.  4.  Osteoporosis: -She has used Forteo in the past.  She is currently receiving Prolia injections every 6 months. -DEXA scan on 01/31/2017 shows T score of -2.9.  5.  Tobacco abuse: -She is current active smoker, smokes half pack per day for 40 years.

## 2019-11-06 NOTE — Patient Instructions (Addendum)
[facility] at [facility] Discharge Instructions  You were seen today by Dr. Katragadda. He went over your recent lab results. He will see you back in 4 months for labs and follow up.   Thank you for choosing [facility] at [facility] to provide your oncology and hematology care.  To afford each patient quality time with our provider, please arrive at least 15 minutes before your scheduled appointment time.   If you have a lab appointment with the Cancer Center please come in thru the  Main Entrance and check in at the main information desk  You need to re-schedule your appointment should you arrive 10 or more minutes late.  We strive to give you quality time with our providers, and arriving late affects you and other patients whose appointments are after yours.  Also, if you no show three or more times for appointments you may be dismissed from the clinic at the providers discretion.     Again, thank you for choosing [facility].  Our hope is that these requests will decrease the amount of time that you wait before being seen by our physicians.       _____________________________________________________________  Should you have questions after your visit to [facility], please contact our office at [phone] between the hours of 8:00 a.m. and 4:30 p.m.  Voicemails left after 4:00 p.m. will not be returned until the following business day.  For prescription refill requests, have your pharmacy contact our office and allow 72 hours.    Cancer Center Support Programs:   > Cancer Support Group  2nd Tuesday of the month 1pm-2pm, Journey Room    

## 2019-11-06 NOTE — Progress Notes (Deleted)
CONSULT NOTE  Patient Care Team: Dettinger, Fransisca Kaufmann, MD as PCP - General (Family Medicine) Steffanie Rainwater, DPM as Consulting Physician (Podiatry) Arvella Nigh, MD as Consulting Physician (Obstetrics and Gynecology)  CHIEF COMPLAINTS/PURPOSE OF CONSULTATION:  ***  HISTORY OF PRESENTING ILLNESS:  Jody Wilson 68 y.o. female is here because of ***  ***She was found to have abnormal CBC from *** ***She denies recent chest pain on exertion, shortness of breath on minimal exertion, pre-syncopal episodes, or palpitations. ***She had not noticed any recent bleeding such as epistaxis, hematuria or hematochezia ***The patient denies over the counter NSAID ingestion. She is not *** on antiplatelets agents. Her last colonoscopy was *** ***She had no prior history or diagnosis of cancer. Her age appropriate screening programs are up-to-date. ***She denies any pica and eats a variety of diet. ***She never donated blood or received blood transfusion ***The patient was prescribed oral iron supplements and she takes ***  MEDICAL HISTORY:  Past Medical History:  Diagnosis Date  . Depression   . Hypertension     SURGICAL HISTORY: Past Surgical History:  Procedure Laterality Date  . ABDOMINAL HYSTERECTOMY    . BACK SURGERY    . CHOLECYSTECTOMY    . FOOT SURGERY     7 in the past  . IR GENERIC HISTORICAL  12/09/2016   IR RADIOLOGIST EVAL & MGMT 12/09/2016 [facility]  . IR GENERIC HISTORICAL  12/10/2016   IR VERTEBROPLASTY CERV/THOR BX INC UNI/BIL INC/INJECT/IMAGING 12/10/2016 [facility]  . TRACHEOSTOMY TUBE PLACEMENT  1982    SOCIAL HISTORY: Social History   Socioeconomic History  . Marital status: Married    Spouse name: Not on file  . Number of children: 3  . Years of education: Not on file  . Highest education level: Not on file  Occupational History  . Occupation: retired  Scientific laboratory technician  . Financial resource strain: Not very hard  . Food insecurity    Worry: Never  true    Inability: Never true  . Transportation needs    Medical: No    Non-medical: No  Tobacco Use  . Smoking status: Current Every Day Smoker    Packs/day: 0.25    Types: Cigarettes  . Smokeless tobacco: Never Used  Substance and Sexual Activity  . Alcohol use: Yes    Comment: occassionally   . Drug use: No  . Sexual activity: Not on file  Lifestyle  . Physical activity    Days per week: 2 days    Minutes per session: 20 min  . Stress: Only a little  Relationships  . Social connections    Talks on phone: More than three times a week    Gets together: More than three times a week    Attends religious service: More than 4 times per year    Active member of club or organization: No    Attends meetings of clubs or organizations: Never    Relationship status: Married  . Intimate partner violence    Fear of current or ex partner: No    Emotionally abused: No    Physically abused: No    Forced sexual activity: No  Other Topics Concern  . Not on file  Social History Narrative  . Not on file    FAMILY HISTORY: Family History  Problem Relation Age of Onset  . Heart disease Mother   . Hypertension Mother   . Hyperlipidemia Mother   . Diabetes Mother   . Congestive Heart Failure  Mother   . Hypertension Son   . Other Son 4       Kienbock's Disease  . Liver disease Son 27       Gilbert's Disease  . Aneurysm Paternal Uncle   . Heart attack Maternal Grandmother   . Heart attack Maternal Grandfather   . Lupus Half-Sister     ALLERGIES:  is allergic to tetracyclines & related.  MEDICATIONS:  Current Outpatient Medications  Medication Sig Dispense Refill  . ALPRAZolam (XANAX) 0.5 MG tablet Take 1 tablet (0.5 mg total) by mouth 2 (two) times daily as needed for anxiety. 60 tablet 2  . calcium-vitamin D (OSCAL WITH D) 500-200 MG-UNIT tablet Take 1 tablet by mouth 2 (two) times daily.     . citalopram (CELEXA) 40 MG tablet Take 1 tablet (40 mg total) by mouth daily.  (Patient taking differently: Take 40 mg by mouth daily. 1/2 tab per day) 90 tablet 3  . denosumab (PROLIA) 60 MG/ML SOSY injection Inject 60 mg into the skin every 6 (six) months. 2 mL 0  . hydrochlorothiazide (MICROZIDE) 12.5 MG capsule Take 1 capsule (12.5 mg total) by mouth daily. 90 capsule 3  . olmesartan (BENICAR) 40 MG tablet Take 1 tablet (40 mg total) by mouth daily. (Patient taking differently: Take 40 mg by mouth daily. 1/2 tab daily) 90 tablet 3  . pantoprazole (PROTONIX) 40 MG tablet Take 1 tablet (40 mg total) by mouth daily. 90 tablet 3  . albuterol (PROVENTIL HFA;VENTOLIN HFA) 108 (90 Base) MCG/ACT inhaler Inhale 2 puffs into the lungs every 6 (six) hours as needed for wheezing or shortness of breath. (Patient not taking: Reported on 11/06/2019) 1 Inhaler 2  . clobetasol (TEMOVATE) 0.05 % external solution Apply 1 application topically 2 (two) times daily. (Patient not taking: Reported on 11/06/2019) 50 mL 3  . mupirocin ointment (BACTROBAN) 2 % Place 1 application into the nose 2 (two) times daily. (Patient not taking: Reported on 11/06/2019) 22 g 0  . tiZANidine (ZANAFLEX) 4 MG capsule Take 1 capsule (4 mg total) by mouth 3 (three) times daily. (Patient not taking: Reported on 11/06/2019) 90 capsule 2  . valACYclovir (VALTREX) 1000 MG tablet Take 1 tablet (1,000 mg total) by mouth 2 (two) times daily. (Patient not taking: Reported on 11/06/2019) 20 tablet 3   Current Facility-Administered Medications  Medication Dose Route Frequency Provider Last Rate Last Dose  . denosumab (PROLIA) injection 60 mg  60 mg Subcutaneous Q6 months Dettinger, Fransisca Kaufmann, MD   60 mg at 04/26/19 1444    REVIEW OF SYSTEMS:   Constitutional: Denies fevers, chills or abnormal night sweats Eyes: Denies blurriness of vision, double vision or watery eyes Ears, nose, mouth, throat, and face: Denies mucositis or sore throat Respiratory: Denies cough, dyspnea or wheezes Cardiovascular: Denies palpitation, chest  discomfort or lower extremity swelling Gastrointestinal:  Denies nausea, heartburn or change in bowel habits Skin: Denies abnormal skin rashes Lymphatics: Denies new lymphadenopathy or easy bruising Neurological:Denies numbness, tingling or new weaknesses Behavioral/Psych: Mood is stable, no new changes  All other systems were reviewed with the patient and are negative.  PHYSICAL EXAMINATION: ECOG PERFORMANCE STATUS: {CHL ONC ECOG XB:[id number]}  Vitals:   11/06/19 1450  BP: (!) 149/86  Pulse: 85  Resp: 20  Temp: (!) 97.1 F (36.2 C)  SpO2: 95%   Filed Weights   11/06/19 1450  Weight: 160 lb 12.8 oz (72.9 kg)    GENERAL:alert, no distress and comfortable SKIN: skin color, texture,  turgor are normal, no rashes or significant lesions EYES: normal, conjunctiva are pink and non-injected, sclera clear OROPHARYNX:no exudate, no erythema and lips, buccal mucosa, and tongue normal  NECK: supple, thyroid normal size, non-tender, without nodularity LYMPH:  no palpable lymphadenopathy in the cervical, axillary or inguinal LUNGS: clear to auscultation and percussion with normal breathing effort HEART: regular rate & rhythm and no murmurs and no lower extremity edema ABDOMEN:abdomen soft, non-tender and normal bowel sounds Musculoskeletal:no cyanosis of digits and no clubbing  PSYCH: alert & oriented x 3 with fluent speech NEURO: no focal motor/sensory deficits  LABORATORY DATA:  I have reviewed the data as listed Recent Results (from the past 2160 hour(s))  CMP14+EGFR     Status: Abnormal   Collection Time: 09/07/19  9:03 AM  Result Value Ref Range   Glucose 106 (H) 65 - 99 mg/dL   BUN 13 8 - 27 mg/dL   Creatinine, Ser 0.84 0.57 - 1.00 mg/dL   GFR calc non Af Amer 72 >59 mL/min/1.73   GFR calc Af Amer 83 >59 mL/min/1.73   BUN/Creatinine Ratio 15 12 - 28   Sodium 137 134 - 144 mmol/L   Potassium 4.1 3.5 - 5.2 mmol/L   Chloride 98 96 - 106 mmol/L   CO2 26 20 - 29 mmol/L    Calcium 8.9 8.7 - 10.3 mg/dL   Total Protein 6.6 6.0 - 8.5 g/dL   Albumin 3.8 3.8 - 4.8 g/dL   Globulin, Total 2.8 1.5 - 4.5 g/dL   Albumin/Globulin Ratio 1.4 1.2 - 2.2   Bilirubin Total 0.3 0.0 - 1.2 mg/dL   Alkaline Phosphatase 87 39 - 117 IU/L   AST 37 0 - 40 IU/L   ALT 33 (H) 0 - 32 IU/L  ToxASSURE Select 13 (MW), Urine     Status: None   Collection Time: 09/07/19  9:03 AM  Result Value Ref Range   Summary Note     Comment: ==================================================================== ToxASSURE Select 13 (MW) ==================================================================== Test                             Result       Flag       Units Drug Present and Declared for Prescription Verification   Alprazolam                     64           EXPECTED   ng/mg creat   Alpha-hydroxyalprazolam        157          EXPECTED   ng/mg creat    Source of alprazolam is a scheduled prescription medication. Alpha-    hydroxyalprazolam is an expected metabolite of alprazolam. ==================================================================== Test                      Result    Flag   Units      Ref Range   Creatinine              167              mg/dL      >=20 ==================================================================== Declared Medications:  The flagging and interpretation on this report are based on the  following declared medications.  Unexpected results may arise from  inaccuracies i n the declared medications.  **Note: The testing scope of this panel includes these medications:  Alprazolam ====================================================================  For clinical consultation, please call [phone]. ====================================================================   Anemia Profile B     Status: Abnormal   Collection Time: 09/07/19  9:03 AM  Result Value Ref Range   Total Iron Binding Capacity 461 (H) 250 - 450 ug/dL   UIBC 434 (H) 118 - 369 ug/dL    Iron 27 27 - 139 ug/dL   Iron Saturation 6 (LL) 15 - 55 %   Ferritin 13 (L) 15 - 150 ng/mL   Vitamin B-12 182 (L) 232 - 1,245 pg/mL   Folate 3.4 >3.0 ng/mL    Comment: A serum folate concentration of less than 3.1 ng/mL is considered to represent clinical deficiency.    WBC 7.6 3.4 - 10.8 x10E3/uL   RBC 3.93 3.77 - 5.28 x10E6/uL   Hemoglobin 11.6 11.1 - 15.9 g/dL   Hematocrit 35.8 34.0 - 46.6 %   MCV 91 79 - 97 fL   MCH 29.5 26.6 - 33.0 pg   MCHC 32.4 31.5 - 35.7 g/dL   RDW 14.3 11.7 - 15.4 %   Platelets 395 150 - 450 x10E3/uL   Neutrophils 67 Not Estab. %   Lymphs 22 Not Estab. %   Monocytes 9 Not Estab. %   Eos 1 Not Estab. %   Basos 1 Not Estab. %   Neutrophils Absolute 5.0 1.4 - 7.0 x10E3/uL   Lymphocytes Absolute 1.7 0.7 - 3.1 x10E3/uL   Monocytes Absolute 0.7 0.1 - 0.9 x10E3/uL   EOS (ABSOLUTE) 0.1 0.0 - 0.4 x10E3/uL   Basophils Absolute 0.1 0.0 - 0.2 x10E3/uL   Immature Granulocytes 0 Not Estab. %   Immature Grans (Abs) 0.0 0.0 - 0.1 x10E3/uL   Retic Ct Pct 2.0 0.6 - 2.6 %  Methylmalonic acid, serum     Status: None   Collection Time: 09/25/19  2:42 PM  Result Value Ref Range   Methylmalonic Acid, Quantitative 244 0 - 378 nmol/L   Disclaimer: Comment     Comment: (NOTE) This test was developed and its performance characteristics determined by [facility]. It has not been cleared or approved by the Food and Drug Administration. Performed At: [facility] [place], [place] [id number] Rush Farmer MD [id number]   Protein electrophoresis, serum     Status: None   Collection Time: 09/25/19  2:42 PM  Result Value Ref Range   Total Protein ELP 6.8 6.0 - 8.5 g/dL   Albumin ELP 3.4 2.9 - 4.4 g/dL   Alpha-1-Globulin 0.2 0.0 - 0.4 g/dL   Alpha-2-Globulin 0.9 0.4 - 1.0 g/dL   Beta Globulin 1.0 0.7 - 1.3 g/dL   Gamma Globulin 1.3 0.4 - 1.8 g/dL   M-Spike, % Not Observed Not Observed g/dL   SPE Interp. Comment     Comment: (NOTE) The SPE pattern  appears unremarkable. Evidence of monoclonal protein is not apparent. Performed At: [facility] [place], [place] [id number] Rush Farmer MD FB:[id number]    Comment Comment     Comment: (NOTE) Protein electrophoresis scan will follow via computer, mail, or courier delivery.    Globulin, Total 3.4 2.2 - 3.9 g/dL   A/G Ratio 1.0 0.7 - 1.7  Copper, serum     Status: None   Collection Time: 09/25/19  2:42 PM  Result Value Ref Range   Copper 129 72 - 166 ug/dL    Comment: (NOTE) This test was developed and its performance characteristics determined by [facility]. It has not been cleared or approved by the Food  and Drug Administration.                                Detection Limit = 5 Performed At: [facility] [place], [place] [id number] Rush Farmer MD [id number]   Reticulocytes     Status: Abnormal   Collection Time: 09/25/19  2:42 PM  Result Value Ref Range   Retic Ct Pct 2.3 0.4 - 3.1 %   RBC. 3.92 3.87 - 5.11 MIL/uL   Retic Count, Absolute 89.8 19.0 - 186.0 K/uL   Immature Retic Fract 29.8 (H) 2.3 - 15.9 %    Comment: Performed at [facility], [address], [place], [place] [address]  Lactate dehydrogenase     Status: None   Collection Time: 09/25/19  2:42 PM  Result Value Ref Range   LDH 184 98 - 192 U/L    Comment: Performed at [facility], [address] [place], [place] [address]  CBC with Differential/Platelet     Status: Abnormal   Collection Time: 09/25/19  2:42 PM  Result Value Ref Range   WBC 8.4 4.0 - 10.5 K/uL   RBC 3.92 3.87 - 5.11 MIL/uL   Hemoglobin 11.4 (L) 12.0 - 15.0 g/dL   HCT 37.4 36.0 - 46.0 %   MCV 95.4 80.0 - 100.0 fL   MCH 29.1 26.0 - 34.0 pg   MCHC 30.5 30.0 - 36.0 g/dL   RDW 15.9 (H) 11.5 - 15.5 %   Platelets 344 150 - 400 K/uL   nRBC 0.0 0.0 - 0.2 %   Neutrophils Relative % 62 %   Neutro Abs 5.2 1.7 - 7.7 K/uL   Lymphocytes Relative 28 %   Lymphs Abs 2.3 0.7 - 4.0 K/uL   Monocytes  Relative 8 %   Monocytes Absolute 0.7 0.1 - 1.0 K/uL   Eosinophils Relative 2 %   Eosinophils Absolute 0.2 0.0 - 0.5 K/uL   Basophils Relative 0 %   Basophils Absolute 0.0 0.0 - 0.1 K/uL   Immature Granulocytes 0 %   Abs Immature Granulocytes 0.03 0.00 - 0.07 K/uL    Comment: Performed at [facility], [address], [place], [place] [address]  TSH     Status: None   Collection Time: 09/25/19  2:42 PM  Result Value Ref Range   TSH 0.884 0.350 - 4.500 uIU/mL    Comment: Performed by a 3rd Generation assay with a functional sensitivity of <=0.01 uIU/mL. Performed at [facility], [address] [place], [place] [address]   CBC with Differential     Status: Abnormal   Collection Time: 10/30/19 10:08 AM  Result Value Ref Range   WBC 6.8 4.0 - 10.5 K/uL   RBC 4.78 3.87 - 5.11 MIL/uL   Hemoglobin 15.8 (H) 12.0 - 15.0 g/dL   HCT 48.9 (H) 36.0 - 46.0 %   MCV 102.3 (H) 80.0 - 100.0 fL   MCH 33.1 26.0 - 34.0 pg   MCHC 32.3 30.0 - 36.0 g/dL   RDW 20.3 (H) 11.5 - 15.5 %   Platelets 277 150 - 400 K/uL   nRBC 0.0 0.0 - 0.2 %   Neutrophils Relative % 69 %   Neutro Abs 4.7 1.7 - 7.7 K/uL   Lymphocytes Relative 21 %   Lymphs Abs 1.4 0.7 - 4.0 K/uL   Monocytes Relative 8 %   Monocytes Absolute 0.6 0.1 - 1.0 K/uL   Eosinophils  Relative 2 %   Eosinophils Absolute 0.1 0.0 - 0.5 K/uL   Basophils Relative 0 %   Basophils Absolute 0.0 0.0 - 0.1 K/uL   Immature Granulocytes 0 %   Abs Immature Granulocytes 0.02 0.00 - 0.07 K/uL    Comment: Performed at [facility], [address], [place], [place] [address]  Iron and TIBC     Status: Abnormal   Collection Time: 10/30/19 10:08 AM  Result Value Ref Range   Iron 175 (H) 28 - 170 ug/dL   TIBC 401 250 - 450 ug/dL   Saturation Ratios 44 (H) 10.4 - 31.8 %   UIBC 226 ug/dL    Comment: Performed at [facility], [address], [place], [place] [address]  Ferritin     Status: None   Collection Time: 10/30/19 10:08 AM  Result Value Ref Range    Ferritin 72 11 - 307 ng/mL    Comment: Performed at [facility], [address], [place], [place] [address]  Vitamin B12     Status: None   Collection Time: 10/30/19 10:08 AM  Result Value Ref Range   Vitamin B-12 487 180 - 914 pg/mL    Comment: (NOTE) This assay is not validated for testing neonatal or myeloproliferative syndrome specimens for Vitamin B12 levels. Performed at [facility], [address], [place], [place] [address]   Folate     Status: None   Collection Time: 10/30/19 10:08 AM  Result Value Ref Range   Folate 6.1 >5.9 ng/mL    Comment: Performed at [facility], [address] [place], [place]  [address]    RADIOGRAPHIC STUDIES: I have personally reviewed the radiological images as listed and agreed with the findings in the report. No results found.  ASSESSMENT & PLAN:  No problem-specific Assessment & Plan notes found for this encounter.     All questions were answered. The patient knows to call the clinic with any problems, questions or concerns. I spent {CHL ONC TIME VISIT - CNGFR:[id number]} counseling the patient face to face. The total time spent in the appointment was {CHL ONC TIME VISIT - CQFJU:[id number]} and more than 50% was on counseling.     Donia Ast, LPN 31/42/76 7:01 PM

## 2019-11-06 NOTE — Progress Notes (Signed)
[name], [place] [address]   CLINIC:  Medical Oncology/Hematology  PCP:  Dettinger, Fransisca Kaufmann, MD [place] [address] [phone]   REASON FOR VISIT:  Follow-up for normocytic anemia.  CURRENT THERAPY: Intermittent Feraheme, daily 123456 and folic acid supplements.   INTERVAL HISTORY:  Jody Wilson 68 y.o. female seen for follow-up of normocytic anemia.  She received Feraheme infusions on 09/28/2019 and 10/05/2019.  She is also taking B12 1 mg daily and folic acid 1 mg daily.  She did not see any major improvement in her energy levels.  She continues to have twitching in her jaw at nighttime and was evaluated by neurology.  Remeron was reportedly discontinued.  Denies any bleeding per rectum or melena.  Chronic headaches have been stable.    REVIEW OF SYSTEMS:  Review of Systems  Constitutional: Positive for fatigue.  Neurological: Positive for headaches.  Psychiatric/Behavioral: Positive for sleep disturbance.  All other systems reviewed and are negative.    PAST MEDICAL/SURGICAL HISTORY:  Past Medical History:  Diagnosis Date  . Depression   . Hypertension    Past Surgical History:  Procedure Laterality Date  . ABDOMINAL HYSTERECTOMY    . BACK SURGERY    . CHOLECYSTECTOMY    . FOOT SURGERY     7 in the past  . IR GENERIC HISTORICAL  12/09/2016   IR RADIOLOGIST EVAL & MGMT 12/09/2016 [facility]  . IR GENERIC HISTORICAL  12/10/2016   IR VERTEBROPLASTY CERV/THOR BX INC UNI/BIL INC/INJECT/IMAGING 12/10/2016 [facility]  . TRACHEOSTOMY TUBE PLACEMENT  1982     SOCIAL HISTORY:  Social History   Socioeconomic History  . Marital status: Married    Spouse name: Not on file  . Number of children: 3  . Years of education: Not on file  . Highest education level: Not on file  Occupational History  . Occupation: retired  Scientific laboratory technician  . Financial resource strain: Not very hard  . Food insecurity    Worry:  Never true    Inability: Never true  . Transportation needs    Medical: No    Non-medical: No  Tobacco Use  . Smoking status: Current Every Day Smoker    Packs/day: 0.25    Types: Cigarettes  . Smokeless tobacco: Never Used  Substance and Sexual Activity  . Alcohol use: Yes    Comment: occassionally   . Drug use: No  . Sexual activity: Not on file  Lifestyle  . Physical activity    Days per week: 2 days    Minutes per session: 20 min  . Stress: Only a little  Relationships  . Social connections    Talks on phone: More than three times a week    Gets together: More than three times a week    Attends religious service: More than 4 times per year    Active member of club or organization: No    Attends meetings of clubs or organizations: Never    Relationship status: Married  . Intimate partner violence    Fear of current or ex partner: No    Emotionally abused: No    Physically abused: No    Forced sexual activity: No  Other Topics Concern  . Not on file  Social History Narrative  . Not on file    FAMILY HISTORY:  Family History  Problem Relation Age of Onset  . Heart disease Mother   . Hypertension Mother   .  Hyperlipidemia Mother   . Diabetes Mother   . Congestive Heart Failure Mother   . Hypertension Son   . Other Son 69       Kienbock's Disease  . Liver disease Son 17       Gilbert's Disease  . Aneurysm Paternal Uncle   . Heart attack Maternal Grandmother   . Heart attack Maternal Grandfather   . Lupus Half-Sister     CURRENT MEDICATIONS:  Outpatient Encounter Medications as of 11/06/2019  Medication Sig  . ALPRAZolam (XANAX) 0.5 MG tablet Take 1 tablet (0.5 mg total) by mouth 2 (two) times daily as needed for anxiety.  . calcium-vitamin D (OSCAL WITH D) 500-200 MG-UNIT tablet Take 1 tablet by mouth 2 (two) times daily.   . citalopram (CELEXA) 40 MG tablet Take 1 tablet (40 mg total) by mouth daily. (Patient taking differently: Take 40 mg by mouth  daily. 1/2 tab per day)  . denosumab (PROLIA) 60 MG/ML SOSY injection Inject 60 mg into the skin every 6 (six) months.  . hydrochlorothiazide (MICROZIDE) 12.5 MG capsule Take 1 capsule (12.5 mg total) by mouth daily.  Marland Kitchen olmesartan (BENICAR) 40 MG tablet Take 1 tablet (40 mg total) by mouth daily. (Patient taking differently: Take 40 mg by mouth daily. 1/2 tab daily)  . pantoprazole (PROTONIX) 40 MG tablet Take 1 tablet (40 mg total) by mouth daily.  Marland Kitchen albuterol (PROVENTIL HFA;VENTOLIN HFA) 108 (90 Base) MCG/ACT inhaler Inhale 2 puffs into the lungs every 6 (six) hours as needed for wheezing or shortness of breath. (Patient not taking: Reported on 11/06/2019)  . clobetasol (TEMOVATE) 0.05 % external solution Apply 1 application topically 2 (two) times daily. (Patient not taking: Reported on 11/06/2019)  . mupirocin ointment (BACTROBAN) 2 % Place 1 application into the nose 2 (two) times daily. (Patient not taking: Reported on 11/06/2019)  . tiZANidine (ZANAFLEX) 4 MG capsule Take 1 capsule (4 mg total) by mouth 3 (three) times daily. (Patient not taking: Reported on 11/06/2019)  . valACYclovir (VALTREX) 1000 MG tablet Take 1 tablet (1,000 mg total) by mouth 2 (two) times daily. (Patient not taking: Reported on 11/06/2019)   Facility-Administered Encounter Medications as of 11/06/2019  Medication  . denosumab (PROLIA) injection 60 mg    ALLERGIES:  Allergies  Allergen Reactions  . Tetracyclines & Related Itching and Swelling     PHYSICAL EXAM:  ECOG Performance status: 1  Vitals:   11/06/19 1450  BP: (!) 149/86  Pulse: 85  Resp: 20  Temp: (!) 97.1 F (36.2 C)  SpO2: 95%   Filed Weights   11/06/19 1450  Weight: 160 lb 12.8 oz (72.9 kg)    Physical Exam Vitals signs reviewed.  Constitutional:      Appearance: Normal appearance.  Cardiovascular:     Rate and Rhythm: Normal rate and regular rhythm.     Heart sounds: Normal heart sounds.  Pulmonary:     Effort: Pulmonary  effort is normal.     Breath sounds: Normal breath sounds.  Abdominal:     General: There is no distension.     Palpations: Abdomen is soft. There is no mass.  Skin:    General: Skin is warm.  Neurological:     General: No focal deficit present.     Mental Status: She is alert and oriented to person, place, and time.  Psychiatric:        Mood and Affect: Mood normal.      LABORATORY DATA:  I have  reviewed the labs as listed.  CBC    Component Value Date/Time   WBC 6.8 10/30/2019 1008   RBC 4.78 10/30/2019 1008   HGB 15.8 (H) 10/30/2019 1008   HGB 11.6 09/07/2019 0903   HCT 48.9 (H) 10/30/2019 1008   HCT 35.8 09/07/2019 0903   PLT 277 10/30/2019 1008   PLT 395 09/07/2019 0903   MCV 102.3 (H) 10/30/2019 1008   MCV 91 09/07/2019 0903   MCH 33.1 10/30/2019 1008   MCHC 32.3 10/30/2019 1008   RDW 20.3 (H) 10/30/2019 1008   RDW 14.3 09/07/2019 0903   LYMPHSABS 1.4 10/30/2019 1008   LYMPHSABS 1.7 09/07/2019 0903   MONOABS 0.6 10/30/2019 1008   EOSABS 0.1 10/30/2019 1008   EOSABS 0.1 09/07/2019 0903   BASOSABS 0.0 10/30/2019 1008   BASOSABS 0.1 09/07/2019 0903   CMP Latest Ref Rng & Units 09/07/2019 06/15/2019 05/30/2019  Glucose 65 - 99 mg/dL 106(H) 101(H) 101(H)  BUN 8 - 27 mg/dL 13 17 18   Creatinine 0.57 - 1.00 mg/dL 0.84 0.84 0.84  Sodium 134 - 144 mmol/L 137 136 141  Potassium 3.5 - 5.2 mmol/L 4.1 4.3 4.1  Chloride 96 - 106 mmol/L 98 99 104  CO2 20 - 29 mmol/L 26 19(L) 22  Calcium 8.7 - 10.3 mg/dL 8.9 9.1 9.4  Total Protein 6.0 - 8.5 g/dL 6.6 6.7 6.7  Total Bilirubin 0.0 - 1.2 mg/dL 0.3 0.4 0.4  Alkaline Phos 39 - 117 IU/L 87 76 78  AST 0 - 40 IU/L 37 40 64(H)  ALT 0 - 32 IU/L 33(H) 36(H) 45(H)       DIAGNOSTIC IMAGING:  I have independently reviewed the scans and discussed with the patient.    ASSESSMENT & PLAN:   Iron deficiency anemia 1.  Normocytic anemia: -CBC on 09/07/2019 shows hemoglobin 11.6, MCV 91, ferritin 13, percent saturation of 6, folic  acid 3.4, [id number]. -Colonoscopy reportedly 5 to 6 years ago at [facility] gastroenterology in [place].  She was told that it was normal and to have repeat colonoscopy in 10 years. -She received Feraheme on 09/28/2019 and 10/05/2019.  Denied any major improvement in the energy levels. -SPEP was negative.  We reviewed repeat blood work which showed hemoglobin 15.8.  MCV is 102.3.  Ferritin improved to 72 and percent saturation 44.  2.  Folic acid deficiency: -She will continue folic acid 1 mg tablet daily.  Latest folic acid is 6.1, up from 3.4 previously.  3.  Vitamin B12 deficiency: -B12 level is 487 up from 182.  She will continue B12 1 mg daily.  4.  Osteoporosis: -She has used Forteo in the past.  She is currently receiving Prolia injections every 6 months. -DEXA scan on 01/31/2017 shows T score of -2.9.  5.  Tobacco abuse: -She is current active smoker, smokes half pack per day for 40 years.      Orders placed this encounter:  Orders Placed This Encounter  Procedures  . CBC with Differential/Platelet  . Comprehensive metabolic panel  . Iron and TIBC  . Ferritin  . Vitamin B12  . Folate      Derek Jack, MD [place] [phone]

## 2019-11-08 ENCOUNTER — Telehealth: Payer: Self-pay | Admitting: Family Medicine

## 2019-11-08 NOTE — Telephone Encounter (Signed)
Patient's blood pressure readings have been elevated the last two days.  156/112 and 176/108.  Advised patient that she should be seen and appointment scheduled with Jody Wilson on 11/09/2019 at 10:05 am.

## 2019-11-09 ENCOUNTER — Ambulatory Visit (INDEPENDENT_AMBULATORY_CARE_PROVIDER_SITE_OTHER): Payer: Medicare Other | Admitting: Family Medicine

## 2019-11-09 ENCOUNTER — Encounter: Payer: Self-pay | Admitting: Family Medicine

## 2019-11-09 ENCOUNTER — Other Ambulatory Visit: Payer: Self-pay

## 2019-11-09 DIAGNOSIS — I10 Essential (primary) hypertension: Secondary | ICD-10-CM

## 2019-11-09 MED ORDER — HYDROCHLOROTHIAZIDE 25 MG PO TABS: 25.0000 mg | ORAL_TABLET | Freq: Every day | ORAL | 1 refills | Status: DC

## 2019-11-09 MED ORDER — OLMESARTAN MEDOXOMIL 40 MG PO TABS: 40.0000 mg | ORAL_TABLET | Freq: Every day | ORAL | 1 refills | Status: DC

## 2019-11-09 NOTE — Progress Notes (Signed)
Assessment & Plan:  1. Essential hypertension - Patient to continue olmesartan 40 mg once daily.  I have increased hydrochlorothiazide from 12.5 mg to 25 mg once daily.  Encouraged to increase exercise and decrease salt intake.  Education provided on the DASH diet.  Advised that if her BP comes down she can follow-up with PCP as scheduled.  If she remains elevated she needs to come back in and have medications adjusted. - olmesartan (BENICAR) 40 MG tablet; Take 1 tablet (40 mg total) by mouth daily.  Dispense: 90 tablet; Refill: 1 - hydrochlorothiazide (HYDRODIURIL) 25 MG tablet; Take 1 tablet (25 mg total) by mouth daily.  Dispense: 90 tablet; Refill: 1   Follow up plan: Return as scheduled.  Hendricks Limes, MSN, APRN, FNP-C [facility]  Subjective:   Patient ID: Jody Wilson, female    DOB: [birth year], 68 y.o.   MRN: [id number]  HPI: Jody Wilson is a 68 y.o. female presenting on 11/09/2019 for Hypertension  Patient reports elevated BP readings over the past couple of days: 156/112, 176/108, 172/100 (last night around 2 AM), and 177/118 (this morning before medication).  Patient started checking her BP when her husband's blood pressure was found to be elevated and she bought him a cuff to start monitoring his.  She states she has been going through a lot this month.  She was referred to hematologist due to low iron and vitamin B12 levels.  She has had 2 iron infusions and a B12 shot.  She she has not been feeling well and has no energy.  She takes both of her blood pressure medications in the morning around 930.  She was previously taking olmesartan 20 mg once daily but since she has noticed her blood pressure has been high she has been taking 40 mg once daily.  She also takes hydrochlorothiazide 12.5 mg daily.  She exercises when she can and she tries not to eat a lot of salt.   ROS: Negative unless specifically indicated above in HPI.   Relevant past medical  history reviewed and updated as indicated.   Allergies and medications reviewed and updated.   Current Outpatient Medications:  .  albuterol (PROVENTIL HFA;VENTOLIN HFA) 108 (90 Base) MCG/ACT inhaler, Inhale 2 puffs into the lungs every 6 (six) hours as needed for wheezing or shortness of breath., Disp: 1 Inhaler, Rfl: 2 .  ALPRAZolam (XANAX) 0.5 MG tablet, Take 1 tablet (0.5 mg total) by mouth 2 (two) times daily as needed for anxiety., Disp: 60 tablet, Rfl: 2 .  calcium-vitamin D (OSCAL WITH D) 500-200 MG-UNIT tablet, Take 1 tablet by mouth 2 (two) times daily. , Disp: , Rfl:  .  citalopram (CELEXA) 40 MG tablet, Take 1 tablet (40 mg total) by mouth daily. (Patient taking differently: Take 40 mg by mouth daily. 1/2 tab per day), Disp: 90 tablet, Rfl: 3 .  clobetasol (TEMOVATE) 0.05 % external solution, Apply 1 application topically 2 (two) times daily., Disp: 50 mL, Rfl: 3 .  denosumab (PROLIA) 60 MG/ML SOSY injection, Inject 60 mg into the skin every 6 (six) months., Disp: 2 mL, Rfl: 0 .  mupirocin ointment (BACTROBAN) 2 %, Place 1 application into the nose 2 (two) times daily., Disp: 22 g, Rfl: 0 .  olmesartan (BENICAR) 40 MG tablet, Take 1 tablet (40 mg total) by mouth daily., Disp: 90 tablet, Rfl: 1 .  pantoprazole (PROTONIX) 40 MG tablet, Take 1 tablet (40 mg total) by mouth daily., Disp:  90 tablet, Rfl: 3 .  tiZANidine (ZANAFLEX) 4 MG capsule, Take 1 capsule (4 mg total) by mouth 3 (three) times daily., Disp: 90 capsule, Rfl: 2 .  valACYclovir (VALTREX) 1000 MG tablet, Take 1 tablet (1,000 mg total) by mouth 2 (two) times daily., Disp: 20 tablet, Rfl: 3 .  hydrochlorothiazide (HYDRODIURIL) 25 MG tablet, Take 1 tablet (25 mg total) by mouth daily., Disp: 90 tablet, Rfl: 1  Current Facility-Administered Medications:  .  denosumab (PROLIA) injection 60 mg, 60 mg, Subcutaneous, Q6 months, Dettinger, Fransisca Kaufmann, MD, 60 mg at 04/26/19 1444  Allergies  Allergen Reactions  . Tetracyclines &  Related Itching and Swelling    Objective:   BP (!) 200/96   Pulse 75   Temp (!) 97.1 F (36.2 C) (Temporal)   Ht 5\' 5"  (1.651 m)   Wt 163 lb 3.2 oz (74 kg)   SpO2 97%   BMI 27.16 kg/m    Physical Exam Vitals signs reviewed.  Constitutional:      General: She is not in acute distress.    Appearance: Normal appearance. She is overweight. She is not ill-appearing, toxic-appearing or diaphoretic.  HENT:     Head: Normocephalic and atraumatic.  Eyes:     General: No scleral icterus.       Right eye: No discharge.        Left eye: No discharge.     Conjunctiva/sclera: Conjunctivae normal.  Neck:     Musculoskeletal: Normal range of motion.  Cardiovascular:     Rate and Rhythm: Normal rate and regular rhythm.     Heart sounds: Normal heart sounds. No murmur. No friction rub. No gallop.   Pulmonary:     Effort: Pulmonary effort is normal. No respiratory distress.     Breath sounds: Normal breath sounds. No stridor. No wheezing, rhonchi or rales.  Musculoskeletal: Normal range of motion.  Skin:    General: Skin is warm and dry.     Capillary Refill: Capillary refill takes less than 2 seconds.  Neurological:     General: No focal deficit present.     Mental Status: She is alert and oriented to person, place, and time. Mental status is at baseline.  Psychiatric:        Mood and Affect: Mood normal.        Behavior: Behavior normal.        Thought Content: Thought content normal.        Judgment: Judgment normal.

## 2019-11-09 NOTE — Patient Instructions (Signed)

## 2019-11-13 ENCOUNTER — Ambulatory Visit: Payer: Medicare Other

## 2019-11-14 ENCOUNTER — Ambulatory Visit (INDEPENDENT_AMBULATORY_CARE_PROVIDER_SITE_OTHER): Payer: Medicare Other

## 2019-11-14 ENCOUNTER — Other Ambulatory Visit: Payer: Self-pay

## 2019-11-14 DIAGNOSIS — M8000XD Age-related osteoporosis with current pathological fracture, unspecified site, subsequent encounter for fracture with routine healing: Secondary | ICD-10-CM

## 2019-11-14 DIAGNOSIS — M810 Age-related osteoporosis without current pathological fracture: Secondary | ICD-10-CM | POA: Diagnosis not present

## 2019-11-14 MED ORDER — DENOSUMAB 60 MG/ML ~~LOC~~ SOSY
60.0000 mg | PREFILLED_SYRINGE | Freq: Once | SUBCUTANEOUS | Status: AC
  Administered 2020-06-13: 60 mg via SUBCUTANEOUS

## 2019-11-14 NOTE — Progress Notes (Signed)
Prolia injection given to right arm Patient tolerated well Buy & bill 

## 2019-12-04 ENCOUNTER — Ambulatory Visit (INDEPENDENT_AMBULATORY_CARE_PROVIDER_SITE_OTHER): Payer: Medicare Other | Admitting: *Deleted

## 2019-12-04 DIAGNOSIS — Z0000 Encounter for general adult medical examination without abnormal findings: Secondary | ICD-10-CM

## 2019-12-04 NOTE — Progress Notes (Signed)
MEDICARE ANNUAL WELLNESS VISIT  12/04/2019  Telephone Visit Disclaimer This Medicare AWV was conducted by telephone due to national recommendations for restrictions regarding the COVID-19 Pandemic (e.g. social distancing).  I verified, using two identifiers, that I am speaking with Jody Wilson or their authorized healthcare agent. I discussed the limitations, risks, security, and privacy concerns of performing an evaluation and management service by telephone and the potential availability of an in-person appointment in the future. The patient expressed understanding and agreed to proceed.   Subjective:  Jody Wilson is a 68 y.o. female patient of Dettinger, Fransisca Kaufmann, MD who had a Medicare Annual Wellness Visit today via telephone. Jody Wilson is Retired and lives with her spouse. She  has two adult sons and two grand sons.      She reports that she is socially active and does interact with friends/family regularly.      She is moderately physically active and enjoys reading and making bead jewelry in her past time.  Patient Care Team: Dettinger, Fransisca Kaufmann, MD as PCP - General (Family Medicine) Steffanie Rainwater, DPM as Consulting Physician (Podiatry) Arvella Nigh, MD as Consulting Physician (Obstetrics and Gynecology) Melvenia Needles., DPM (Podiatry)  Advanced Directives 11/06/2019 10/05/2019 09/28/2019 09/24/2019 11/30/2018 02/15/2017 02/01/2017  Does Patient Have a Medical Advance Directive? No - No No No No No  Would patient like information on creating a medical advance directive? Yes (MAU/Ambulatory/Procedural Areas - Information given) No - Patient declined No - Patient declined No - Patient declined Yes (MAU/Ambulatory/Procedural Areas - Information given) No - Patient declined No - Patient declined    Hospital Utilization Over the Past 12 Months: # of hospitalizations or ER visits: 0 # of surgeries: 0  Review of Systems    Patient reports that her overall health is worse compared  to last year.  No complaints at this time.  Patient Reported Readings (BP, Pulse, CBG, Weight, etc) none  Pain Assessment Pain : No/denies pain     Current Medications & Allergies (verified) Allergies as of 12/04/2019      Reactions   Tetracyclines & Related Itching, Swelling      Medication List       Accurate as of December 04, 2019  3:50 PM. If you have any questions, ask your nurse or doctor.        albuterol 108 (90 Base) MCG/ACT inhaler Commonly known as: VENTOLIN HFA Inhale 2 puffs into the lungs every 6 (six) hours as needed for wheezing or shortness of breath.   ALPRAZolam 0.5 MG tablet Commonly known as: XANAX Take 1 tablet (0.5 mg total) by mouth 2 (two) times daily as needed for anxiety.   calcium-vitamin D 500-200 MG-UNIT tablet Commonly known as: OSCAL WITH D Take 1 tablet by mouth 2 (two) times daily.   citalopram 40 MG tablet Commonly known as: CELEXA Take 1 tablet (40 mg total) by mouth daily. What changed: additional instructions   clobetasol 0.05 % external solution Commonly known as: TEMOVATE Apply 1 application topically 2 (two) times daily.   denosumab 60 MG/ML Sosy injection Commonly known as: PROLIA Inject 60 mg into the skin every 6 (six) months.   hydrochlorothiazide 25 MG tablet Commonly known as: HYDRODIURIL Take 1 tablet (25 mg total) by mouth daily.   mupirocin ointment 2 % Commonly known as: Bactroban Place 1 application into the nose 2 (two) times daily.   olmesartan 40 MG tablet Commonly known as: BENICAR Take 1 tablet (40 mg  total) by mouth daily.   pantoprazole 40 MG tablet Commonly known as: PROTONIX Take 1 tablet (40 mg total) by mouth daily.   tiZANidine 4 MG capsule Commonly known as: ZANAFLEX Take 1 capsule (4 mg total) by mouth 3 (three) times daily.   valACYclovir 1000 MG tablet Commonly known as: VALTREX Take 1 tablet (1,000 mg total) by mouth 2 (two) times daily.   WOMENS 50+ MULTI VITAMIN/MIN  PO Take by mouth.       History (reviewed): Past Medical History:  Diagnosis Date  . Anemia   . Anxiety   . Depression   . GERD (gastroesophageal reflux disease)   . Hyperlipidemia   . Hypertension   . Osteoporosis    Past Surgical History:  Procedure Laterality Date  . ABDOMINAL HYSTERECTOMY    . BACK SURGERY    . CHOLECYSTECTOMY    . FOOT SURGERY     7 in the past  . IR GENERIC HISTORICAL  12/09/2016   IR RADIOLOGIST EVAL & MGMT 12/09/2016 [facility]  . IR GENERIC HISTORICAL  12/10/2016   IR VERTEBROPLASTY CERV/THOR BX INC UNI/BIL INC/INJECT/IMAGING 12/10/2016 [facility]  . TRACHEOSTOMY TUBE PLACEMENT  1982   Family History  Problem Relation Age of Onset  . Heart disease Mother   . Hypertension Mother   . Hyperlipidemia Mother   . Diabetes Mother   . Congestive Heart Failure Mother   . Hypertension Son   . Other Son 30       Kienbock's Disease  . Liver disease Son 44       Gilbert's Disease  . Aneurysm Paternal Uncle   . Heart attack Maternal Grandmother   . Heart attack Maternal Grandfather   . Lupus Half-Sister    Social History   Socioeconomic History  . Marital status: Married    Spouse name: Donnie  . Number of children: 3  . Years of education: 48  . Highest education level: Associate degree: occupational, Hotel manager, or vocational program  Occupational History  . 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  Tobacco Use  . Smoking status: Current Every Day Smoker    Packs/day: 0.25    Types: Cigarettes  . Smokeless tobacco: Never Used  . Tobacco comment: Trying to cut on number of cigarettes she smokes.  Substance and Sexual Activity  . Alcohol use: Yes    Comment: occassionally   . Drug use: No  . Sexual activity: Not on file  Other Topics Concern  . Not on file  Social History Narrative   Lives with her husband, [name], and has two grown sons and two grand sons.   She  visits family and has a once weekly outing with a friend for socializing.  She enjoys making beaded jewelry, reading and walking..   Social Determinants of Health   Financial Resource Strain: Low Risk   . Difficulty of Paying Living Expenses: Not hard at all  Food Insecurity: No Food Insecurity  . Worried About Charity fundraiser in the Last Year: Never true  . Ran Out of Food in the Last Year: Never true  Transportation Needs: No Transportation Needs  . Lack of Transportation (Medical): No  . Lack of Transportation (Non-Medical): No  Physical Activity: Sufficiently Active  . Days of Exercise per Week: 5 days  . Minutes of Exercise per Session: 60 min  Stress:   . Feeling of Stress : Not on file  Social Connections: Not Isolated  . Frequency of Communication with Friends and Family: Twice a week  . Frequency of Social Gatherings with Friends and Family: Once a week  . Attends Religious Services: More than 4 times per year  . Active Member of Clubs or Organizations: Yes  . Attends Archivist Meetings: More than 4 times per year  . Marital Status: Married    Activities of Daily Living In your present state of health, do you have any difficulty performing the following activities: 12/04/2019  Hearing? N  Vision? N  Difficulty concentrating or making decisions? N  Walking or climbing stairs? N  Dressing or bathing? N  Doing errands, shopping? N  Preparing Food and eating ? N  Using the Toilet? N  In the past six months, have you accidently leaked urine? Y  Comment Only if coughing.  Do you have problems with loss of bowel control? N  Managing your Medications? N  Managing your Finances? N  Housekeeping or managing your Housekeeping? N  Some recent data might be hidden    Patient Education/ Literacy How often do you need to have someone help you when you read instructions, pamphlets, or other written materials from your doctor or pharmacy?: 1 - Never What is the  last grade level you completed in school?: two years college  Exercise Current Exercise Habits: Home exercise routine, Type of exercise: walking, Time (Minutes): 60, Frequency (Times/Week): 5, Weekly Exercise (Minutes/Week): 300, Intensity: Moderate, Exercise limited by: None identified  Diet Patient reports consuming 1 meals a day and 2 snack(s) a day Patient reports that her primary diet is: Regular Patient reports that she does have regular access to food.   Depression Screen PHQ 2/9 Scores 12/04/2019 11/09/2019 09/07/2019 05/30/2019 11/30/2018 11/24/2018 07/26/2018  PHQ - 2 Score 0 0 0 0 1 0 3  PHQ- 9 Score - - - - - - 13  Exception Documentation - - - - - - -     Fall Risk Fall Risk  12/04/2019 11/09/2019 09/07/2019 11/30/2018 11/24/2018  Falls in the past year? 0 0 0 1 0  Number falls in past yr: - - - 1 -  Injury with Fall? - - - 1 -  Comment - - - - -  Risk for fall due to : - - - History of fall(s);Impaired balance/gait -  Follow up Falls prevention discussed - - Education provided;Falls prevention discussed -     Objective:  Jody Wilson seemed alert and oriented and she participated appropriately during our telephone visit.  Blood Pressure Weight BMI  BP Readings from Last 3 Encounters:  11/09/19 (!) 200/96  11/06/19 (!) 149/86  10/05/19 (!) 142/76   Wt Readings from Last 3 Encounters:  11/09/19 163 lb 3.2 oz (74 kg)  11/06/19 160 lb 12.8 oz (72.9 kg)  09/26/19 167 lb 5 oz (75.9 kg)   BMI Readings from Last 1 Encounters:  11/09/19 27.16 kg/m    *Unable to obtain current vital signs, weight, and BMI due to telephone visit type  Hearing/Vision  . Melinna did not seem to have difficulty with hearing/understanding during the telephone conversation . Reports that she has a formal eye exam by an eye care professional regularly and will schedule for the coming year. . Reports that she has not had a formal hearing evaluation within the past year *Unable to fully assess  hearing and vision during telephone visit type  Cognitive Function: 6CIT Screen 12/04/2019  What Year? 0  points  What month? 0 points  What time? 0 points  Count back from 20 0 points  Months in reverse 0 points  Repeat phrase 0 points  Total Score 0   (Normal:0-7, Significant for Dysfunction: >8)  Normal Cognitive Function Screening: Yes   Immunization & Health Maintenance Record Immunization History  Administered Date(s) Administered  . Fluad Quad(high Dose 65+) 09/25/2019  . Influenza, High Dose Seasonal PF 11/30/2018  . Pneumococcal Conjugate-13 03/15/2018  . Pneumococcal Polysaccharide-23 05/30/2019    Health Maintenance  Topic Date Due  . TETANUS/TDAP  05/29/2020 (Originally [birth year]19/1970)  . MAMMOGRAM  09/25/2020  . COLONOSCOPY  02/18/2024  . INFLUENZA VACCINE  Completed  . DEXA SCAN  Completed  . Hepatitis C Screening  Completed  . PNA vac Low Risk Adult  Completed       Assessment  This is a routine wellness examination for Jody Wilson.  Health Maintenance: Due or Overdue There are no preventive care reminders to display for this patient.  Jody Wilson does not need a referral for Community Assistance: Care Management:   no Social Work:    no Prescription Assistance:  no Nutrition/Diabetes Education:  no   Plan:  Personalized Goals Goals Addressed            This Visit's Progress   . DIET - INCREASE WATER INTAKE      . DIET - REDUCE SALT INTAKE TO 2 GRAMS PER DAY OR LESS      . Quit Smoking        Personalized Health Maintenance & Screening Recommendations  Td vaccine, Shingrix vacine, Dexascan, HIV screen, Hep C screen  Lung Cancer Screening Recommended: yes (Low Dose CT Chest recommended if Age 55-80 years, 30 pack-year currently smoking OR have quit w/in past 15 years) Hepatitis C Screening recommended: yes HIV Screening recommended: yes  Advanced Directives: Written information was mailed, per patient's request.  Referrals &  Orders No orders of the defined types were placed in this encounter.   Follow-up Plan . Follow-up with Dettinger, Fransisca Kaufmann, MD as planned . Scheduled 01-04-2020 with pcp. . Scheduled with Dr. Radene Knee , [facility] for gyn check.   I have personally reviewed and noted the following in the patient's chart:   . Medical and social history . Use of alcohol, tobacco or illicit drugs  . Current medications and supplements . Functional ability and status . Nutritional status . Physical activity . Advanced directives . List of other physicians . Hospitalizations, surgeries, and ER visits in previous 12 months . Vitals . Screenings to include cognitive, depression, and falls . Referrals and appointments  In addition, I have reviewed and discussed with Jody Wilson certain preventive protocols, quality metrics, and best practice recommendations. A written personalized care plan for preventive services as well as general preventive health recommendations is available and can be mailed to the patient at her request.      Johnell Comings LPN [id number]

## 2019-12-04 NOTE — Patient Instructions (Signed)
Preventive Care 68 Years and Older, Female Preventive care refers to lifestyle choices and visits with your health care provider that can promote health and wellness. This includes:  A yearly physical exam. This is also called an annual well check.  Regular dental and eye exams.  Immunizations.  Screening for certain conditions.  Healthy lifestyle choices, such as diet and exercise. What can I expect for my preventive care visit? Physical exam Your health care provider will check:  Height and weight. These may be used to calculate body mass index (BMI), which is a measurement that tells if you are at a healthy weight.  Heart rate and blood pressure.  Your skin for abnormal spots. Counseling Your health care provider may ask you questions about:  Alcohol, tobacco, and drug use.  Emotional well-being.  Home and relationship well-being.  Sexual activity.  Eating habits.  History of falls.  Memory and ability to understand (cognition).  Work and work Statistician.  Pregnancy and menstrual history. What immunizations do I need?  Influenza (flu) vaccine  This is recommended every year. Tetanus, diphtheria, and pertussis (Tdap) vaccine  You may need a Td booster every 10 years. Varicella (chickenpox) vaccine  You may need this vaccine if you have not already been vaccinated. Zoster (shingles) vaccine  You may need this after age 33. Pneumococcal conjugate (PCV13) vaccine  One dose is recommended after age 33. Pneumococcal polysaccharide (PPSV23) vaccine  One dose is recommended after age 72. Measles, mumps, and rubella (MMR) vaccine  You may need at least one dose of MMR if you were born in 1957 or later. You may also need a second dose. Meningococcal conjugate (MenACWY) vaccine  You may need this if you have certain conditions. Hepatitis A vaccine  You may need this if you have certain conditions or if you travel or work in places where you may be exposed  to hepatitis A. Hepatitis B vaccine  You may need this if you have certain conditions or if you travel or work in places where you may be exposed to hepatitis B. Haemophilus influenzae type b (Hib) vaccine  You may need this if you have certain conditions. You may receive vaccines as individual doses or as more than one vaccine together in one shot (combination vaccines). Talk with your health care provider about the risks and benefits of combination vaccines. What tests do I need? Blood tests  Lipid and cholesterol levels. These may be checked every 5 years, or more frequently depending on your overall health.  Hepatitis C test.  Hepatitis B test. Screening  Lung cancer screening. You may have this screening every year starting at age 39 if you have a 30-pack-year history of smoking and currently smoke or have quit within the past 15 years.  Colorectal cancer screening. All adults should have this screening starting at age 36 and continuing until age 15. Your health care provider may recommend screening at age 23 if you are at increased risk. You will have tests every 1-10 years, depending on your results and the type of screening test.  Diabetes screening. This is done by checking your blood sugar (glucose) after you have not eaten for a while (fasting). You may have this done every 1-3 years.  Mammogram. This may be done every 1-2 years. Talk with your health care provider about how often you should have regular mammograms.  BRCA-related cancer screening. This may be done if you have a family history of breast, ovarian, tubal, or peritoneal cancers.  Other tests  Sexually transmitted disease (STD) testing.  Bone density scan. This is done to screen for osteoporosis. You may have this done starting at age 76. Follow these instructions at home: Eating and drinking  Eat a diet that includes fresh fruits and vegetables, whole grains, lean protein, and low-fat dairy products. Limit  your intake of foods with high amounts of sugar, saturated fats, and salt.  Take vitamin and mineral supplements as recommended by your health care provider.  Do not drink alcohol if your health care provider tells you not to drink.  If you drink alcohol: ? Limit how much you have to 0-1 drink a day. ? Be aware of how much alcohol is in your drink. In the [place] one drink equals one 12 oz bottle of beer (355 mL), one 5 oz glass of wine (148 mL), or one 1 oz glass of hard liquor (44 mL). Lifestyle  Take daily care of your teeth and gums.  Stay active. Exercise for at least 30 minutes on 5 or more days each week.  Do not use any products that contain nicotine or tobacco, such as cigarettes, e-cigarettes, and chewing tobacco. If you need help quitting, ask your health care provider.  If you are sexually active, practice safe sex. Use a condom or other form of protection in order to prevent STIs (sexually transmitted infections).  Talk with your health care provider about taking a low-dose aspirin or statin. What's next?  Go to your health care provider once a year for a well check visit.  Ask your health care provider how often you should have your eyes and teeth checked.  Stay up to date on all vaccines. This information is not intended to replace advice given to you by your health care provider. Make sure you discuss any questions you have with your health care provider. Document Released: 01/02/2016 Document Revised: 11/30/2018 Document Reviewed: 11/30/2018 Elsevier Patient Education  2020 [employer].

## 2019-12-10 ENCOUNTER — Ambulatory Visit: Payer: Medicare Other | Admitting: Family Medicine

## 2019-12-24 ENCOUNTER — Encounter: Payer: Self-pay | Admitting: Family Medicine

## 2019-12-24 ENCOUNTER — Other Ambulatory Visit: Payer: Self-pay

## 2019-12-24 ENCOUNTER — Ambulatory Visit (INDEPENDENT_AMBULATORY_CARE_PROVIDER_SITE_OTHER): Payer: Medicare Other | Admitting: Family Medicine

## 2019-12-24 DIAGNOSIS — B001 Herpesviral vesicular dermatitis: Secondary | ICD-10-CM | POA: Diagnosis not present

## 2019-12-24 DIAGNOSIS — G8929 Other chronic pain: Secondary | ICD-10-CM

## 2019-12-24 DIAGNOSIS — R109 Unspecified abdominal pain: Secondary | ICD-10-CM

## 2019-12-24 MED ORDER — VALACYCLOVIR HCL 1 G PO TABS: 2000.0000 mg | ORAL_TABLET | Freq: Two times a day (BID) | ORAL | 10 refills | Status: DC

## 2019-12-24 NOTE — Progress Notes (Signed)
Subjective:    Patient ID: Jody Wilson, female    DOB: March 11, 1951, 69 y.o.   MRN: [id number]   HPI: Jody Wilson is a 69 y.o. female presenting for back in November BP 210/118. Saw Britney the next day. Meds increased. Benicar had been 40 mg, 1/2 daily. Since then BP has been fluctuating. Went down last  Night. Was 11/52. Been in that range several times. Feeling very weak. Appetite poor. Had iron infusions 3 months ago for anemia. Plans follow up with hematology this month.   Poor appetite. Loose bowels for three months. Initially it was watery with mucous for a month. Now loose. Going to bathroom right after every meal. Sore in the bottom of her stomach. Has been diagnosed with dumping.    Depression screen [facility] 2/9 12/04/2019 11/09/2019 09/07/2019 05/30/2019 11/30/2018  Decreased Interest 0 0 0 0 1  Down, Depressed, Hopeless 0 0 0 0 0  PHQ - 2 Score 0 0 0 0 1  Altered sleeping - - - - -  Tired, decreased energy - - - - -  Change in appetite - - - - -  Feeling bad or failure about yourself  - - - - -  Trouble concentrating - - - - -  Moving slowly or fidgety/restless - - - - -  Suicidal thoughts - - - - -  PHQ-9 Score - - - - -     Relevant past medical, surgical, family and social history reviewed and updated as indicated.  Interim medical history since our last visit reviewed. Allergies and medications reviewed and updated.  ROS:  Review of Systems  Constitutional: Positive for fatigue.  HENT: Negative for congestion and mouth sores.   Eyes: Negative for visual disturbance.  Respiratory: Negative for shortness of breath.   Cardiovascular: Negative for chest pain.  Gastrointestinal: Positive for abdominal pain, diarrhea and nausea. Negative for constipation and vomiting.  Genitourinary: Negative for difficulty urinating.  Musculoskeletal: Negative for arthralgias and myalgias.  Neurological: Negative for headaches.  Psychiatric/Behavioral: Negative for sleep  disturbance.     Social History   Tobacco Use  Smoking Status Current Every Day Smoker  . Packs/day: 0.25  . Types: Cigarettes  Smokeless Tobacco Never Used  Tobacco Comment   Trying to cut on number of cigarettes she smokes.       Objective:     Wt Readings from Last 3 Encounters:  11/09/19 163 lb 3.2 oz (74 kg)  11/06/19 160 lb 12.8 oz (72.9 kg)  09/26/19 167 lb 5 oz (75.9 kg)     Exam deferred. Pt. Harboring due to COVID 19. Phone visit performed.   Assessment & Plan:   1. Chronic abdominal pain   2. Cold sore     Meds ordered this encounter  Medications  . valACYclovir (VALTREX) 1000 MG tablet    Sig: Take 2 tablets (2,000 mg total) by mouth 2 (two) times daily. For 2 days    Dispense:  8 tablet    Refill:  10    No orders of the defined types were placed in this encounter.     Diagnoses and all orders for this visit:  Chronic abdominal pain  Cold sore  Other orders -     valACYclovir (VALTREX) 1000 MG tablet; Take 2 tablets (2,000 mg total) by mouth 2 (two) times daily. For 2 days    Virtual Visit via telephone Note  I discussed the limitations, risks, security and privacy concerns of  performing an evaluation and management service by telephone and the availability of in person appointments. The patient was identified with two identifiers. Pt.expressed understanding and agreed to proceed. Pt. Is at home. Dr. Livia Snellen is in his office.  Follow Up Instructions:   I discussed the assessment and treatment plan with the patient. The patient was provided an opportunity to ask questions and all were answered. The patient agreed with the plan and demonstrated an understanding of the instructions.   The patient was advised to call back or seek an in-person evaluation if the symptoms worsen or if the condition fails to improve as anticipated.   Total minutes including chart review and phone contact time: 18   Follow up plan: No follow-ups on  file.  Claretta Fraise, MD [place]

## 2020-01-04 ENCOUNTER — Encounter: Payer: Self-pay | Admitting: Family Medicine

## 2020-01-04 ENCOUNTER — Ambulatory Visit (INDEPENDENT_AMBULATORY_CARE_PROVIDER_SITE_OTHER): Payer: Medicare Other | Admitting: Family Medicine

## 2020-01-04 DIAGNOSIS — E785 Hyperlipidemia, unspecified: Secondary | ICD-10-CM | POA: Diagnosis not present

## 2020-01-04 DIAGNOSIS — K219 Gastro-esophageal reflux disease without esophagitis: Secondary | ICD-10-CM | POA: Diagnosis not present

## 2020-01-04 DIAGNOSIS — F4323 Adjustment disorder with mixed anxiety and depressed mood: Secondary | ICD-10-CM | POA: Diagnosis not present

## 2020-01-04 DIAGNOSIS — I10 Essential (primary) hypertension: Secondary | ICD-10-CM

## 2020-01-04 MED ORDER — ALPRAZOLAM 0.5 MG PO TABS: 0.5000 mg | ORAL_TABLET | Freq: Two times a day (BID) | ORAL | 2 refills | Status: DC | PRN

## 2020-01-04 MED ORDER — PANTOPRAZOLE SODIUM 40 MG PO TBEC: 40.0000 mg | DELAYED_RELEASE_TABLET | Freq: Every day | ORAL | 3 refills | Status: DC

## 2020-01-04 NOTE — Progress Notes (Signed)
Virtual Visit via telephone Note  I connected with Jody Wilson on 01/04/20 at 1010 by telephone and verified that I am speaking with the correct person using two identifiers. Jody Wilson is currently located at home and no other people are currently with her during visit. The provider, Fransisca Kaufmann Kaytie Ratcliffe, MD is located in their office at time of visit.  Call ended at 1027  I discussed the limitations, risks, security and privacy concerns of performing an evaluation and management service by telephone and the availability of in person appointments. I also discussed with the patient that there may be a patient responsible charge related to this service. The patient expressed understanding and agreed to proceed.   History and Present Illness: Hypertension Patient is currently on olmesartan 40, and their blood pressure today is 130/80. Patient denies any lightheadedness or dizziness. Patient denies headaches, blurred vision, chest pains, shortness of breath, or weakness. Denies any side effects from medication and is content with current medication.   Depression and anxiety Patient is taking alprazolam and citalopram and feels like things are going well. She denies any suicidal ideations Current rx- alprazolam 0.5 bid prn # meds rx- 60 Effectiveness of current meds-working well Adverse reactions form meds-none  Pill count performed-No Last drug screen - 09/18/19 ( high risk q5m, moderate risk q45m, low risk yearly ) Urine drug screen today- No Was the [facility] reviewed- yes  If yes were their any concerning findings? - none  No flowsheet data found.   Controlled substance contract signed on: 09/18/19   Patient is having sinus drainage for 6 weeks. She has a mild couhg and congestion.  She used to have wood stove and switched to all natural gas. She will purchase humidifier.  No diagnosis found.  Outpatient Encounter Medications as of 01/04/2020  Medication Sig  . albuterol  (PROVENTIL HFA;VENTOLIN HFA) 108 (90 Base) MCG/ACT inhaler Inhale 2 puffs into the lungs every 6 (six) hours as needed for wheezing or shortness of breath.  . ALPRAZolam (XANAX) 0.5 MG tablet Take 1 tablet (0.5 mg total) by mouth 2 (two) times daily as needed for anxiety.  . calcium-vitamin D (OSCAL WITH D) 500-200 MG-UNIT tablet Take 1 tablet by mouth 2 (two) times daily.   . citalopram (CELEXA) 40 MG tablet Take 1 tablet (40 mg total) by mouth daily. (Patient taking differently: Take 40 mg by mouth daily. 1/2 tab per day)  . clobetasol (TEMOVATE) 0.05 % external solution Apply 1 application topically 2 (two) times daily. (Patient not taking: Reported on 12/04/2019)  . denosumab (PROLIA) 60 MG/ML SOSY injection Inject 60 mg into the skin every 6 (six) months.  . hydrochlorothiazide (HYDRODIURIL) 25 MG tablet Take 1 tablet (25 mg total) by mouth daily.  . Multiple Vitamins-Minerals (WOMENS 50+ MULTI VITAMIN/MIN PO) Take by mouth.  . mupirocin ointment (BACTROBAN) 2 % Place 1 application into the nose 2 (two) times daily.  Marland Kitchen olmesartan (BENICAR) 40 MG tablet Take 1 tablet (40 mg total) by mouth daily.  . pantoprazole (PROTONIX) 40 MG tablet Take 1 tablet (40 mg total) by mouth daily.  Marland Kitchen tiZANidine (ZANAFLEX) 4 MG capsule Take 1 capsule (4 mg total) by mouth 3 (three) times daily.  . valACYclovir (VALTREX) 1000 MG tablet Take 2 tablets (2,000 mg total) by mouth 2 (two) times daily. For 2 days   Facility-Administered Encounter Medications as of 01/04/2020  Medication  . denosumab (PROLIA) injection 60 mg  . denosumab (PROLIA) injection 60 mg  Review of Systems  Constitutional: Negative for chills and fever.  Eyes: Negative for visual disturbance.  Respiratory: Negative for chest tightness and shortness of breath.   Cardiovascular: Negative for chest pain and leg swelling.  Musculoskeletal: Negative for back pain and gait problem.  Skin: Negative for rash.  Neurological: Positive for tremors.  Negative for light-headedness and headaches.  Psychiatric/Behavioral: Positive for dysphoric mood and sleep disturbance. Negative for agitation, behavioral problems, self-injury and suicidal ideas. The patient is nervous/anxious.   All other systems reviewed and are negative.   Observations/Objective: Patient sounds comfortable and in no acute distress  Assessment and Plan: Problem List Items Addressed This Visit      Cardiovascular and Mediastinum   Hypertension     Digestive   GERD (gastroesophageal reflux disease)   Relevant Medications   pantoprazole (PROTONIX) 40 MG tablet     Other   Adjustment disorder with mixed anxiety and depressed mood   Relevant Medications   ALPRAZolam (XANAX) 0.5 MG tablet   Hyperlipidemia with target LDL less than 100 - Primary      Continue current medication including alprazolam and citalopram, she has been seeing a neurologist helping with some of the twitching and motor issues.  She is sleeping at night for right now.  Her mood is doing good for right now as well. Follow up plan: Return in about 3 months (around 04/03/2020), or if symptoms worsen or fail to improve, for htn hld.     I discussed the assessment and treatment plan with the patient. The patient was provided an opportunity to ask questions and all were answered. The patient agreed with the plan and demonstrated an understanding of the instructions.   The patient was advised to call back or seek an in-person evaluation if the symptoms worsen or if the condition fails to improve as anticipated.  The above assessment and management plan was discussed with the patient. The patient verbalized understanding of and has agreed to the management plan. Patient is aware to call the clinic if symptoms persist or worsen. Patient is aware when to return to the clinic for a follow-up visit. Patient educated on when it is appropriate to go to the emergency department.    I provided 17 minutes of  non-face-to-face time during this encounter.    Worthy Rancher, MD

## 2020-01-07 ENCOUNTER — Telehealth: Payer: Self-pay | Admitting: Family Medicine

## 2020-01-07 DIAGNOSIS — I10 Essential (primary) hypertension: Secondary | ICD-10-CM

## 2020-01-07 MED ORDER — HYDROCHLOROTHIAZIDE 12.5 MG PO CAPS: 12.5000 mg | ORAL_CAPSULE | Freq: Every day | ORAL | 1 refills | Status: DC

## 2020-01-07 NOTE — Telephone Encounter (Signed)
Pt aware of provider feedback and voiced understanding. She states she needs rx for the HCTZ 12.5 sent to the pharmacy. Rx sent.

## 2020-01-07 NOTE — Telephone Encounter (Signed)
Pt states she started feeling dizzy and "not that great" Saturday morning and took her BP reading 168/101 this morning it was 181/104. She doesn't have any complaints at the moment except feeling like her "heart flutters" every once in a while. Pt is only taking Benicar 40mg  daily around 10 AM. She wants to know if she needs to go back on the HCTZ but maybe only 12.5mg  instead of 25mg . She took her BP while she was on the phone with me and the reading was 191/125 HR is 67.

## 2020-01-07 NOTE — Telephone Encounter (Signed)
Yes have her go ahead and try the hydrochlorothiazide 12.5 mg and see how it does for her.

## 2020-01-24 ENCOUNTER — Ambulatory Visit: Payer: Medicare Other | Admitting: Neurology

## 2020-02-21 ENCOUNTER — Ambulatory Visit (INDEPENDENT_AMBULATORY_CARE_PROVIDER_SITE_OTHER): Payer: Medicare Other | Admitting: Family Medicine

## 2020-02-21 ENCOUNTER — Encounter: Payer: Self-pay | Admitting: Family Medicine

## 2020-02-21 DIAGNOSIS — K047 Periapical abscess without sinus: Secondary | ICD-10-CM | POA: Diagnosis not present

## 2020-02-21 MED ORDER — AMOXICILLIN 500 MG PO CAPS: ORAL_CAPSULE | ORAL | 0 refills | Status: DC

## 2020-02-21 NOTE — Progress Notes (Signed)
Virtual Visit via Telephone Note  I connected with Jody Wilson on 02/21/20 at 12:42 PM by telephone and verified that I am speaking with the correct person using two identifiers. Jody Wilson is currently located at home and nobody is currently with her during this visit. The provider, Loman Brooklyn, FNP is located in their home at time of visit.  I discussed the limitations, risks, security and privacy concerns of performing an evaluation and management service by telephone and the availability of in person appointments. I also discussed with the patient that there may be a patient responsible charge related to this service. The patient expressed understanding and agreed to proceed.  Subjective: PCP: Dettinger, Fransisca Kaufmann, MD  Chief Complaint  Patient presents with  . Dental Pain   Patient reports her gums abscessed under an old root canal on the bottom right side.  States she had this root canal years ago and had to go back multiple times for them to go back in and get more of the root out.  States the last time she saw her dentist she was told that they definitely got it all out and that if it happened again she just needed to get some antibiotics.  He is now retired so she was unable to call him.  She does report swelling on that side of her face as well as pain.   ROS: Per HPI  Current Outpatient Medications:  .  albuterol (PROVENTIL HFA;VENTOLIN HFA) 108 (90 Base) MCG/ACT inhaler, Inhale 2 puffs into the lungs every 6 (six) hours as needed for wheezing or shortness of breath., Disp: 1 Inhaler, Rfl: 2 .  ALPRAZolam (XANAX) 0.5 MG tablet, Take 1 tablet (0.5 mg total) by mouth 2 (two) times daily as needed for anxiety., Disp: 60 tablet, Rfl: 2 .  calcium-vitamin D (OSCAL WITH D) 500-200 MG-UNIT tablet, Take 1 tablet by mouth 2 (two) times daily. , Disp: , Rfl:  .  citalopram (CELEXA) 40 MG tablet, Take 1 tablet (40 mg total) by mouth daily. (Patient taking differently: Take 40 mg  by mouth daily. 1/2 tab per day), Disp: 90 tablet, Rfl: 3 .  clobetasol (TEMOVATE) 0.05 % external solution, Apply 1 application topically 2 (two) times daily. (Patient not taking: Reported on 12/04/2019), Disp: 50 mL, Rfl: 3 .  denosumab (PROLIA) 60 MG/ML SOSY injection, Inject 60 mg into the skin every 6 (six) months., Disp: 2 mL, Rfl: 0 .  hydrochlorothiazide (MICROZIDE) 12.5 MG capsule, Take 1 capsule (12.5 mg total) by mouth daily., Disp: 30 capsule, Rfl: 1 .  Multiple Vitamins-Minerals (WOMENS 50+ MULTI VITAMIN/MIN PO), Take by mouth., Disp: , Rfl:  .  mupirocin ointment (BACTROBAN) 2 %, Place 1 application into the nose 2 (two) times daily., Disp: 22 g, Rfl: 0 .  olmesartan (BENICAR) 40 MG tablet, Take 1 tablet (40 mg total) by mouth daily., Disp: 90 tablet, Rfl: 1 .  pantoprazole (PROTONIX) 40 MG tablet, Take 1 tablet (40 mg total) by mouth daily., Disp: 90 tablet, Rfl: 3 .  tiZANidine (ZANAFLEX) 4 MG capsule, Take 1 capsule (4 mg total) by mouth 3 (three) times daily., Disp: 90 capsule, Rfl: 2 .  valACYclovir (VALTREX) 1000 MG tablet, Take 2 tablets (2,000 mg total) by mouth 2 (two) times daily. For 2 days, Disp: 8 tablet, Rfl: 10  Current Facility-Administered Medications:  .  denosumab (PROLIA) injection 60 mg, 60 mg, Subcutaneous, Q6 months, Dettinger, Fransisca Kaufmann, MD, 60 mg at 11/14/19 1553 .  denosumab (PROLIA) injection 60 mg, 60 mg, Subcutaneous, Once, Dettinger, Fransisca Kaufmann, MD  Allergies  Allergen Reactions  . Tetracyclines & Related Itching and Swelling   Past Medical History:  Diagnosis Date  . Anemia   . Anxiety   . Depression   . GERD (gastroesophageal reflux disease)   . Hyperlipidemia   . Hypertension   . Osteoporosis     Observations/Objective: A&O  No respiratory distress or wheezing audible over the phone Mood, judgement, and thought processes all WNL   Assessment and Plan: 1. Dental abscess - Education provided on dental abscesses.  Encouraged patient to  take Tylenol/ibuprofen for pain.  Also encouraged salt water gargles. - amoxicillin (AMOXIL) 500 MG capsule; Take 2 tablets (1,000 mg) by mouth once, then take 1 tablet (500 mg) every 8 hours x3 days.  Dispense: 11 capsule; Refill: 0  Follow Up Instructions:  I discussed the assessment and treatment plan with the patient. The patient was provided an opportunity to ask questions and all were answered. The patient agreed with the plan and demonstrated an understanding of the instructions.   The patient was advised to call back or seek an in-person evaluation if the symptoms worsen or if the condition fails to improve as anticipated.  The above assessment and management plan was discussed with the patient. The patient verbalized understanding of and has agreed to the management plan. Patient is aware to call the clinic if symptoms persist or worsen. Patient is aware when to return to the clinic for a follow-up visit. Patient educated on when it is appropriate to go to the emergency department.   Time call ended: 12:50 PM  I provided 10 minutes of non-face-to-face time during this encounter.  Hendricks Limes, MSN, APRN, FNP-C [facility] 02/21/20

## 2020-02-21 NOTE — Patient Instructions (Signed)
Dental Abscess  A dental abscess is an area of pus in or around a tooth. It comes from an infection. It can cause pain and other symptoms. Treatment will help with symptoms and prevent the infection from spreading. Follow these instructions at home: Medicines  Take over-the-counter and prescription medicines only as told by your dentist.  If you were prescribed an antibiotic medicine, take it as told by your dentist. Do not stop taking it even if you start to feel better.  If you were prescribed a gel that has numbing medicine in it, use it exactly as told.  Do not drive or use heavy machinery (like a lawn mower) while taking prescription pain medicine. General instructions  Rinse out your mouth often with salt water. ? To make salt water, dissolve -1 tsp of salt in 1 cup of warm water.  Eat a soft diet while your mouth is healing.  Drink enough fluid to keep your urine pale yellow.  Do not apply heat to the outside of your mouth.  Do not use any products that contain nicotine or tobacco. These include cigarettes and e-cigarettes. If you need help quitting, ask your doctor.  Keep all follow-up visits as told by your dentist. This is important. Prevent an abscess  Brush your teeth every morning and every night. Use fluoride toothpaste.  Floss your teeth each day.  Get dental cleanings as often as told by your dentist.  Think about getting dental sealant put on teeth that have deep holes (decay).  Drink water that has fluoride in it. ? Most tap water has fluoride. ? Check the label on bottled water to see if it has fluoride in it.  Drink water instead of sugary drinks.  Eat healthy meals and snacks.  Wear a mouth guard or face shield when you play sports. Contact a doctor if:  Your pain is worse, and medicine does not help. Get help right away if:  You have a fever or chills.  Your symptoms suddenly get worse.  You have a very bad headache.  You have problems  breathing or swallowing.  You have trouble opening your mouth.  You have swelling in your neck or close to your eye. Summary  A dental abscess is an area of pus in or around a tooth. It is caused by an infection.  Treatment will help with symptoms and prevent the infection from spreading.  Take over-the-counter and prescription medicines only as told by your dentist.  To prevent an abscess, take good care of your teeth. Brush your teeth every morning and night. Use floss every day.  Get dental cleanings as often as told by your dentist. This information is not intended to replace advice given to you by your health care provider. Make sure you discuss any questions you have with your health care provider. Document Revised: 03/28/2019 Document Reviewed: 08/08/2017 Elsevier Patient Education  2020 [employer]  

## 2020-02-25 DIAGNOSIS — Z23 Encounter for immunization: Secondary | ICD-10-CM | POA: Diagnosis not present

## 2020-03-04 ENCOUNTER — Inpatient Hospital Stay (HOSPITAL_COMMUNITY): Payer: Medicare Other | Attending: Hematology

## 2020-03-04 ENCOUNTER — Other Ambulatory Visit: Payer: Self-pay

## 2020-03-04 DIAGNOSIS — F419 Anxiety disorder, unspecified: Secondary | ICD-10-CM | POA: Insufficient documentation

## 2020-03-04 DIAGNOSIS — Z8249 Family history of ischemic heart disease and other diseases of the circulatory system: Secondary | ICD-10-CM | POA: Insufficient documentation

## 2020-03-04 DIAGNOSIS — K219 Gastro-esophageal reflux disease without esophagitis: Secondary | ICD-10-CM | POA: Insufficient documentation

## 2020-03-04 DIAGNOSIS — Z79899 Other long term (current) drug therapy: Secondary | ICD-10-CM | POA: Insufficient documentation

## 2020-03-04 DIAGNOSIS — Z832 Family history of diseases of the blood and blood-forming organs and certain disorders involving the immune mechanism: Secondary | ICD-10-CM | POA: Insufficient documentation

## 2020-03-04 DIAGNOSIS — D509 Iron deficiency anemia, unspecified: Secondary | ICD-10-CM | POA: Diagnosis not present

## 2020-03-04 DIAGNOSIS — K921 Melena: Secondary | ICD-10-CM | POA: Insufficient documentation

## 2020-03-04 DIAGNOSIS — E538 Deficiency of other specified B group vitamins: Secondary | ICD-10-CM | POA: Insufficient documentation

## 2020-03-04 DIAGNOSIS — Z8379 Family history of other diseases of the digestive system: Secondary | ICD-10-CM | POA: Diagnosis not present

## 2020-03-04 DIAGNOSIS — R197 Diarrhea, unspecified: Secondary | ICD-10-CM | POA: Insufficient documentation

## 2020-03-04 DIAGNOSIS — R7989 Other specified abnormal findings of blood chemistry: Secondary | ICD-10-CM | POA: Insufficient documentation

## 2020-03-04 DIAGNOSIS — Z83438 Family history of other disorder of lipoprotein metabolism and other lipidemia: Secondary | ICD-10-CM | POA: Insufficient documentation

## 2020-03-04 DIAGNOSIS — F17210 Nicotine dependence, cigarettes, uncomplicated: Secondary | ICD-10-CM | POA: Insufficient documentation

## 2020-03-04 DIAGNOSIS — M810 Age-related osteoporosis without current pathological fracture: Secondary | ICD-10-CM | POA: Insufficient documentation

## 2020-03-04 DIAGNOSIS — F329 Major depressive disorder, single episode, unspecified: Secondary | ICD-10-CM | POA: Diagnosis not present

## 2020-03-04 DIAGNOSIS — R634 Abnormal weight loss: Secondary | ICD-10-CM | POA: Diagnosis not present

## 2020-03-04 DIAGNOSIS — E785 Hyperlipidemia, unspecified: Secondary | ICD-10-CM | POA: Insufficient documentation

## 2020-03-04 DIAGNOSIS — R5383 Other fatigue: Secondary | ICD-10-CM | POA: Insufficient documentation

## 2020-03-04 DIAGNOSIS — I10 Essential (primary) hypertension: Secondary | ICD-10-CM | POA: Insufficient documentation

## 2020-03-04 LAB — COMPREHENSIVE METABOLIC PANEL
ALT: 46 U/L — ABNORMAL HIGH (ref 0–44)
AST: 50 U/L — ABNORMAL HIGH (ref 15–41)
Albumin: 4 g/dL (ref 3.5–5.0)
Alkaline Phosphatase: 68 U/L (ref 38–126)
Anion gap: 11 (ref 5–15)
BUN: 18 mg/dL (ref 8–23)
CO2: 30 mmol/L (ref 22–32)
Calcium: 9.2 mg/dL (ref 8.9–10.3)
Chloride: 99 mmol/L (ref 98–111)
Creatinine, Ser: 0.77 mg/dL (ref 0.44–1.00)
GFR calc Af Amer: >60 mL/min (ref >60)
GFR calc non Af Amer: >60 mL/min (ref >60)
Glucose, Bld: 107 mg/dL — ABNORMAL HIGH (ref 70–99)
Potassium: 3.7 mmol/L (ref 3.5–5.1)
Sodium: 140 mmol/L (ref 135–145)
Total Bilirubin: 0.9 mg/dL (ref 0.3–1.2)
Total Protein: 7.3 g/dL (ref 6.5–8.1)

## 2020-03-04 LAB — CBC WITH DIFFERENTIAL/PLATELET
Abs Immature Granulocytes: 0.02 10*3/uL (ref 0.00–0.07)
Basophils Absolute: 0 10*3/uL (ref 0.0–0.1)
Basophils Relative: 1 %
Eosinophils Absolute: 0.2 10*3/uL (ref 0.0–0.5)
Eosinophils Relative: 2 %
HCT: 46.5 % — ABNORMAL HIGH (ref 36.0–46.0)
Hemoglobin: 16 g/dL — ABNORMAL HIGH (ref 12.0–15.0)
Immature Granulocytes: 0 %
Lymphocytes Relative: 24 %
Lymphs Abs: 2 10*3/uL (ref 0.7–4.0)
MCH: 36.4 pg — ABNORMAL HIGH (ref 26.0–34.0)
MCHC: 34.4 g/dL (ref 30.0–36.0)
MCV: 105.7 fL — ABNORMAL HIGH (ref 80.0–100.0)
Monocytes Absolute: 0.6 10*3/uL (ref 0.1–1.0)
Monocytes Relative: 7 %
Neutro Abs: 5.6 10*3/uL (ref 1.7–7.7)
Neutrophils Relative %: 66 %
Platelets: 271 10*3/uL (ref 150–400)
RBC: 4.4 MIL/uL (ref 3.87–5.11)
RDW: 12.5 % (ref 11.5–15.5)
WBC: 8.5 10*3/uL (ref 4.0–10.5)
nRBC: 0 % (ref 0.0–0.2)

## 2020-03-04 LAB — FOLATE: Folate: 5.1 ng/mL — ABNORMAL LOW (ref >5.9)

## 2020-03-04 LAB — FERRITIN: Ferritin: 55 ng/mL (ref 11–307)

## 2020-03-04 LAB — IRON AND TIBC
Iron: 108 ug/dL (ref 28–170)
Saturation Ratios: 28 % (ref 10.4–31.8)
TIBC: 391 ug/dL (ref 250–450)
UIBC: 283 ug/dL

## 2020-03-04 LAB — VITAMIN B12: Vitamin B-12: 230 pg/mL (ref 180–914)

## 2020-03-07 ENCOUNTER — Other Ambulatory Visit: Payer: Self-pay | Admitting: Family Medicine

## 2020-03-07 DIAGNOSIS — I10 Essential (primary) hypertension: Secondary | ICD-10-CM

## 2020-03-11 ENCOUNTER — Inpatient Hospital Stay (HOSPITAL_BASED_OUTPATIENT_CLINIC_OR_DEPARTMENT_OTHER): Payer: Medicare Other | Admitting: Hematology

## 2020-03-11 ENCOUNTER — Other Ambulatory Visit: Payer: Self-pay

## 2020-03-11 ENCOUNTER — Encounter (HOSPITAL_COMMUNITY): Payer: Self-pay | Admitting: Hematology

## 2020-03-11 VITALS — BP 135/77 | HR 70 | Temp 96.8°F | Resp 16 | Wt 150.6 lb

## 2020-03-11 DIAGNOSIS — R197 Diarrhea, unspecified: Secondary | ICD-10-CM | POA: Diagnosis not present

## 2020-03-11 DIAGNOSIS — R7989 Other specified abnormal findings of blood chemistry: Secondary | ICD-10-CM | POA: Diagnosis not present

## 2020-03-11 DIAGNOSIS — K921 Melena: Secondary | ICD-10-CM | POA: Diagnosis not present

## 2020-03-11 DIAGNOSIS — E538 Deficiency of other specified B group vitamins: Secondary | ICD-10-CM

## 2020-03-11 DIAGNOSIS — D509 Iron deficiency anemia, unspecified: Secondary | ICD-10-CM

## 2020-03-11 DIAGNOSIS — R634 Abnormal weight loss: Secondary | ICD-10-CM | POA: Diagnosis not present

## 2020-03-11 NOTE — Patient Instructions (Signed)
[facility] at [facility] Discharge Instructions  You were seen today by Dr. Delton Coombes. He went over your recent lab results. Dr. Delton Coombes wants you to take Folic acid 123XX123 mcg or 1 mg daily and Vitamin B12 1000 mcg or 1 mg daily.  He will schedule you for Fereheme (iron infusion) one dose.   He will see you back in 4 months for labs and follow up.   Thank you for choosing [facility] at [facility] to provide your oncology and hematology care.  To afford each patient quality time with our provider, please arrive at least 15 minutes before your scheduled appointment time.   If you have a lab appointment with the [facility] please come in thru the  Main Entrance and check in at the main information desk  You need to re-schedule your appointment should you arrive 10 or more minutes late.  We strive to give you quality time with our providers, and arriving late affects you and other patients whose appointments are after yours.  Also, if you no show three or more times for appointments you may be dismissed from the clinic at the providers discretion.     Again, thank you for choosing [facility].  Our hope is that these requests will decrease the amount of time that you wait before being seen by our physicians.       _____________________________________________________________  Should you have questions after your visit to [facility], please contact our office at [phone] between the hours of 8:00 a.m. and 4:30 p.m.  Voicemails left after 4:00 p.m. will not be returned until the following business day.  For prescription refill requests, have your pharmacy contact our office and allow 72 hours.    Cancer Center Support Programs:   > Cancer Support Group  2nd Tuesday of the month 1pm-2pm, Journey Room

## 2020-03-11 NOTE — Progress Notes (Signed)
[place], [place] [address]   CLINIC:  Medical Oncology/Hematology  PCP:  Dettinger, Fransisca Kaufmann, MD [place] [address] [phone]   REASON FOR VISIT:  Follow-up for normocytic anemia.  CURRENT THERAPY: Intermittent Feraheme, daily [id number] and folic acid supplements.   INTERVAL HISTORY:  Ms. Inzunza 69 y.o. female seen for follow-up of normocytic anemia from iron deficiency, B12 deficiency and folic acid deficiency.  She reports about 10 pound weight loss.  She reports that food goes through after eating.  She has loose stools.  She also has mucus in the stool.  She had black stools x2.  Last 1 was 2 days ago.  Colonoscopy was more than 5 years ago.  Reports drinking about 2 mixed drinks per week.  Reports decrease in appetite and energy levels.    REVIEW OF SYSTEMS:  Review of Systems  Constitutional: Positive for fatigue.  All other systems reviewed and are negative.    PAST MEDICAL/SURGICAL HISTORY:  Past Medical History:  Diagnosis Date  . Anemia   . Anxiety   . Depression   . GERD (gastroesophageal reflux disease)   . Hyperlipidemia   . Hypertension   . Osteoporosis    Past Surgical History:  Procedure Laterality Date  . ABDOMINAL HYSTERECTOMY    . BACK SURGERY    . CHOLECYSTECTOMY    . FOOT SURGERY     7 in the past  . IR GENERIC HISTORICAL  12/09/2016   IR RADIOLOGIST EVAL & MGMT 12/09/2016 [facility]  . IR GENERIC HISTORICAL  12/10/2016   IR VERTEBROPLASTY CERV/THOR BX INC UNI/BIL INC/INJECT/IMAGING 12/10/2016 [facility]  . TRACHEOSTOMY TUBE PLACEMENT  1982     SOCIAL HISTORY:  Social History   Socioeconomic History  . Marital status: Married    Spouse name: Donnie  . Number of children: 3  . Years of education: 91  . Highest education level: Associate degree: occupational, Hotel manager, or vocational program  Occupational History  . 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  Tobacco Use  . Smoking status: Current Every Day Smoker    Packs/day: 0.25    Types: Cigarettes  . Smokeless tobacco: Never Used  . Tobacco comment: Trying to cut on number of cigarettes she smokes.  Substance and Sexual Activity  . Alcohol use: Yes    Comment: occassionally   . Drug use: No  . Sexual activity: Not on file  Other Topics Concern  . Not on file  Social History Narrative   Lives with her husband, [name], and has two grown sons and two grand sons.   She visits family and has a once weekly outing with a friend for socializing.  She enjoys making beaded jewelry, reading and walking..   Social Determinants of Health   Financial Resource Strain: Low Risk   . Difficulty of Paying Living Expenses: Not hard at all  Food Insecurity: No Food Insecurity  . Worried About Charity fundraiser in the Last Year: Never true  . Ran Out of Food in the Last Year: Never true  Transportation Needs: No Transportation Needs  . Lack of Transportation (Medical): No  . Lack of Transportation (Non-Medical): No  Physical Activity: Sufficiently Active  . Days of Exercise per Week: 5 days  . Minutes of Exercise per Session: 60 min  Stress:   . Feeling of Stress :  Social Connections: Not Isolated  . Frequency of Communication with Friends and Family: Twice a week  . Frequency of Social Gatherings with Friends and Family: Once a week  . Attends Religious Services: More than 4 times per year  . Active Member of Clubs or Organizations: Yes  . Attends Archivist Meetings: More than 4 times per year  . Marital Status: Married  Human resources officer Violence: Not At Risk  . Fear of Current or Ex-Partner: No  . Emotionally Abused: No  . Physically Abused: No  . Sexually Abused: No    FAMILY HISTORY:  Family History  Problem Relation Age of Onset  . Heart disease Mother   . Hypertension Mother   . Hyperlipidemia Mother   . Diabetes  Mother   . Congestive Heart Failure Mother   . Hypertension Son   . Other Son 30       Kienbock's Disease  . Liver disease Son 86       Gilbert's Disease  . Aneurysm Paternal Uncle   . Heart attack Maternal Grandmother   . Heart attack Maternal Grandfather   . Lupus Half-Sister     CURRENT MEDICATIONS:  Outpatient Encounter Medications as of 03/11/2020  Medication Sig  . albuterol (PROVENTIL HFA;VENTOLIN HFA) 108 (90 Base) MCG/ACT inhaler Inhale 2 puffs into the lungs every 6 (six) hours as needed for wheezing or shortness of breath.  . ALPRAZolam (XANAX) 0.5 MG tablet Take 1 tablet (0.5 mg total) by mouth 2 (two) times daily as needed for anxiety.  . calcium-vitamin D (OSCAL WITH D) 500-200 MG-UNIT tablet Take 1 tablet by mouth 2 (two) times daily.   . citalopram (CELEXA) 40 MG tablet Take 1 tablet (40 mg total) by mouth daily. (Patient taking differently: Take 40 mg by mouth daily. 1/2 tab per day)  . clobetasol (TEMOVATE) 0.05 % external solution Apply 1 application topically 2 (two) times daily.  Marland Kitchen denosumab (PROLIA) 60 MG/ML SOSY injection Inject 60 mg into the skin every 6 (six) months.  . hydrochlorothiazide (MICROZIDE) 12.5 MG capsule TAKE (1) CAPSULE DAILY  . Multiple Vitamins-Minerals (WOMENS 50+ MULTI VITAMIN/MIN PO) Take by mouth.  . olmesartan (BENICAR) 40 MG tablet Take 1 tablet (40 mg total) by mouth daily.  . pantoprazole (PROTONIX) 40 MG tablet Take 1 tablet (40 mg total) by mouth daily.  Marland Kitchen tiZANidine (ZANAFLEX) 4 MG capsule Take 1 capsule (4 mg total) by mouth 3 (three) times daily.  . valACYclovir (VALTREX) 1000 MG tablet Take 2 tablets (2,000 mg total) by mouth 2 (two) times daily. For 2 days  . [DISCONTINUED] amoxicillin (AMOXIL) 500 MG capsule Take 2 tablets (1,000 mg) by mouth once, then take 1 tablet (500 mg) every 8 hours x3 days.  . mupirocin ointment (BACTROBAN) 2 % Place 1 application into the nose 2 (two) times daily. (Patient not taking: Reported on  03/11/2020)   Facility-Administered Encounter Medications as of 03/11/2020  Medication  . denosumab (PROLIA) injection 60 mg  . denosumab (PROLIA) injection 60 mg    ALLERGIES:  Allergies  Allergen Reactions  . Tetracyclines & Related Itching and Swelling     PHYSICAL EXAM:  ECOG Performance status: 1  Vitals:   03/11/20 1100  BP: 135/77  Pulse: 70  Resp: 16  Temp: (!) 96.8 F (36 C)  SpO2: 99%   Filed Weights   03/11/20 1100  Weight: 150 lb 9 oz (68.3 kg)    Physical Exam Vitals reviewed.  Constitutional:  Appearance: Normal appearance.  Cardiovascular:     Rate and Rhythm: Normal rate and regular rhythm.     Heart sounds: Normal heart sounds.  Pulmonary:     Effort: Pulmonary effort is normal.     Breath sounds: Normal breath sounds.  Abdominal:     General: There is no distension.     Palpations: Abdomen is soft. There is no mass.  Skin:    General: Skin is warm.  Neurological:     General: No focal deficit present.     Mental Status: She is alert and oriented to person, place, and time.  Psychiatric:        Mood and Affect: Mood normal.      LABORATORY DATA:  I have reviewed the labs as listed.  CBC    Component Value Date/Time   WBC 8.5 03/04/2020 1005   RBC 4.40 03/04/2020 1005   HGB 16.0 (H) 03/04/2020 1005   HGB 11.6 09/07/2019 0903   HCT 46.5 (H) 03/04/2020 1005   HCT 35.8 09/07/2019 0903   PLT 271 03/04/2020 1005   PLT 395 09/07/2019 0903   MCV 105.7 (H) 03/04/2020 1005   MCV 91 09/07/2019 0903   MCH 36.4 (H) 03/04/2020 1005   MCHC 34.4 03/04/2020 1005   RDW 12.5 03/04/2020 1005   RDW 14.3 09/07/2019 0903   LYMPHSABS 2.0 03/04/2020 1005   LYMPHSABS 1.7 09/07/2019 0903   MONOABS 0.6 03/04/2020 1005   EOSABS 0.2 03/04/2020 1005   EOSABS 0.1 09/07/2019 0903   BASOSABS 0.0 03/04/2020 1005   BASOSABS 0.1 09/07/2019 0903   CMP Latest Ref Rng & Units 03/04/2020 09/07/2019 06/15/2019  Glucose 70 - 99 mg/dL 107(H) 106(H) 101(H)  BUN  8 - 23 mg/dL 18 13 17   Creatinine 0.44 - 1.00 mg/dL 0.77 0.84 0.84  Sodium 135 - 145 mmol/L 140 137 136  Potassium 3.5 - 5.1 mmol/L 3.7 4.1 4.3  Chloride 98 - 111 mmol/L 99 98 99  CO2 22 - 32 mmol/L 30 26 19(L)  Calcium 8.9 - 10.3 mg/dL 9.2 8.9 9.1  Total Protein 6.5 - 8.1 g/dL 7.3 6.6 6.7  Total Bilirubin 0.3 - 1.2 mg/dL 0.9 0.3 0.4  Alkaline Phos 38 - 126 U/L 68 87 76  AST 15 - 41 U/L 50(H) 37 40  ALT 0 - 44 U/L 46(H) 33(H) 36(H)       DIAGNOSTIC IMAGING:  I have reviewed her scans.    ASSESSMENT & PLAN:   Iron deficiency anemia 1.  Normocytic anemia: -Colonoscopy more than 5 years ago at [facility] gastroenterology in [place].  She was told to have a normal colonoscopy. -Last Feraheme infusion was on 10/05/2019. -We reviewed her labs.  CBC shows hemoglobin 16.  Ferritin is 55, decreased from 72 previously.  She is complaining of feeling very tired. -I have recommended 1 infusion of Feraheme.  I will reevaluate her in 4 months with labs.  2.  Vitamin B12 deficiency: -She is taking multivitamin with B12 in it.  Her B12 is low at 230. -I have suggested her to take B12 1 mg tablet daily.  3.  Folic acid deficiency: -Folic acid today is 5.1.  She is again taking multivitamin with folic acid in it.  I have suggested her to buy folic acid 1 mg tablet and take it daily.  4.  Osteoporosis: -DEXA scan on 01/31/2017 shows T score -2.9.  She is on Prolia every 6 months.  5.  Tobacco abuse: -She is current active  smoker.  Smokes half pack per day for 40 years.  We will talk about if she qualifies for low-dose screening CT scan.  6.  Elevated LFTs: -AST was 50.  ALT was 46.  She reports that she is drinking about 2 mixed drinks per week.  I have told her to cut back on it.  7.  Weight loss: -He lost about 10 pounds since last visit few months ago. -She reports that food goes through her and she gets loose stools.  She reported black stools x2.  Last 1 was 2 days ago. -I have told  her to reach out to [facility] gastroenterology.      Orders placed this encounter:  Orders Placed This Encounter  Procedures  . Vitamin B12  . Folate  . Ferritin  . Iron and TIBC  . CBC with Differential  . Comprehensive metabolic panel      Derek Jack, MD [place] [phone]

## 2020-03-11 NOTE — Assessment & Plan Note (Signed)
1.  Normocytic anemia: -Colonoscopy more than 5 years ago at [facility] gastroenterology in [place].  She was told to have a normal colonoscopy. -Last Feraheme infusion was on 10/05/2019. -We reviewed her labs.  CBC shows hemoglobin 16.  Ferritin is 55, decreased from 72 previously.  She is complaining of feeling very tired. -I have recommended 1 infusion of Feraheme.  I will reevaluate her in 4 months with labs.  2.  Vitamin B12 deficiency: -She is taking multivitamin with B12 in it.  Her B12 is low at 230. -I have suggested her to take B12 1 mg tablet daily.  3.  Folic acid deficiency: -Folic acid today is 5.1.  She is again taking multivitamin with folic acid in it.  I have suggested her to buy folic acid 1 mg tablet and take it daily.  4.  Osteoporosis: -DEXA scan on 01/31/2017 shows T score -2.9.  She is on Prolia every 6 months.  5.  Tobacco abuse: -She is current active smoker.  Smokes half pack per day for 40 years.  We will talk about if she qualifies for low-dose screening CT scan.  6.  Elevated LFTs: -AST was 50.  ALT was 46.  She reports that she is drinking about 2 mixed drinks per week.  I have told her to cut back on it.  7.  Weight loss: -He lost about 10 pounds since last visit few months ago. -She reports that food goes through her and she gets loose stools.  She reported black stools x2.  Last 1 was 2 days ago. -I have told her to reach out to [facility] gastroenterology.

## 2020-03-17 ENCOUNTER — Inpatient Hospital Stay (HOSPITAL_COMMUNITY): Payer: Medicare Other

## 2020-03-17 ENCOUNTER — Other Ambulatory Visit: Payer: Self-pay

## 2020-03-17 VITALS — BP 118/69 | HR 59 | Temp 96.9°F | Resp 16

## 2020-03-17 DIAGNOSIS — D509 Iron deficiency anemia, unspecified: Secondary | ICD-10-CM | POA: Diagnosis not present

## 2020-03-17 DIAGNOSIS — K921 Melena: Secondary | ICD-10-CM | POA: Diagnosis not present

## 2020-03-17 DIAGNOSIS — E538 Deficiency of other specified B group vitamins: Secondary | ICD-10-CM | POA: Diagnosis not present

## 2020-03-17 DIAGNOSIS — R197 Diarrhea, unspecified: Secondary | ICD-10-CM | POA: Diagnosis not present

## 2020-03-17 DIAGNOSIS — R7989 Other specified abnormal findings of blood chemistry: Secondary | ICD-10-CM | POA: Diagnosis not present

## 2020-03-17 DIAGNOSIS — R634 Abnormal weight loss: Secondary | ICD-10-CM | POA: Diagnosis not present

## 2020-03-17 MED ORDER — SODIUM CHLORIDE 0.9 % IV SOLN: INTRAVENOUS | Status: DC

## 2020-03-17 MED ORDER — SODIUM CHLORIDE 0.9 % IV SOLN
510.0000 mg | Freq: Once | INTRAVENOUS | Status: AC
  Administered 2020-03-17: 510 mg via INTRAVENOUS
  Filled 2020-03-17: qty 510

## 2020-03-17 NOTE — Patient Instructions (Signed)
[place] at [facility]  Discharge Instructions:  IV iron infusion received today. Follow up as scheduled. _______________________________________________________________  Thank you for choosing [place] at [facility] to provide your oncology and hematology care.  To afford each patient quality time with our providers, please arrive at least 15 minutes before your scheduled appointment.  You need to re-schedule your appointment if you arrive 10 or more minutes late.  We strive to give you quality time with our providers, and arriving late affects you and other patients whose appointments are after yours.  Also, if you no show three or more times for appointments you may be dismissed from the clinic.  Again, thank you for choosing [place] at [place] hope is that these requests will allow you access to exceptional care and in a timely manner. _______________________________________________________________  If you have questions after your visit, please contact our office at [phone] between the hours of 8:30 a.m. and 5:00 p.m. Voicemails left after 4:30 p.m. will not be returned until the following business day. _______________________________________________________________  For prescription refill requests, have your pharmacy contact our office. _______________________________________________________________  Recommendations made by the consultant and any test results will be sent to your referring physician. _______________________________________________________________

## 2020-03-17 NOTE — Progress Notes (Signed)
Jody Wilson presents today for IV iron infusion. Infusion tolerated without incident or complaint. See MAR for details. VSS prior to and post infusion. Discharged in satisfactory condition with follow up instructions.

## 2020-03-24 DIAGNOSIS — Z23 Encounter for immunization: Secondary | ICD-10-CM | POA: Diagnosis not present

## 2020-04-02 ENCOUNTER — Other Ambulatory Visit: Payer: Self-pay

## 2020-04-02 ENCOUNTER — Inpatient Hospital Stay (HOSPITAL_COMMUNITY): Payer: Medicare Other | Attending: Hematology

## 2020-04-02 DIAGNOSIS — R634 Abnormal weight loss: Secondary | ICD-10-CM | POA: Diagnosis not present

## 2020-04-02 DIAGNOSIS — Z833 Family history of diabetes mellitus: Secondary | ICD-10-CM | POA: Insufficient documentation

## 2020-04-02 DIAGNOSIS — R7989 Other specified abnormal findings of blood chemistry: Secondary | ICD-10-CM | POA: Insufficient documentation

## 2020-04-02 DIAGNOSIS — Z79899 Other long term (current) drug therapy: Secondary | ICD-10-CM | POA: Insufficient documentation

## 2020-04-02 DIAGNOSIS — M810 Age-related osteoporosis without current pathological fracture: Secondary | ICD-10-CM | POA: Insufficient documentation

## 2020-04-02 DIAGNOSIS — F17210 Nicotine dependence, cigarettes, uncomplicated: Secondary | ICD-10-CM | POA: Insufficient documentation

## 2020-04-02 DIAGNOSIS — Z8249 Family history of ischemic heart disease and other diseases of the circulatory system: Secondary | ICD-10-CM | POA: Insufficient documentation

## 2020-04-02 DIAGNOSIS — Z8349 Family history of other endocrine, nutritional and metabolic diseases: Secondary | ICD-10-CM | POA: Insufficient documentation

## 2020-04-02 DIAGNOSIS — E538 Deficiency of other specified B group vitamins: Secondary | ICD-10-CM | POA: Diagnosis not present

## 2020-04-02 DIAGNOSIS — I10 Essential (primary) hypertension: Secondary | ICD-10-CM | POA: Insufficient documentation

## 2020-04-02 DIAGNOSIS — R42 Dizziness and giddiness: Secondary | ICD-10-CM | POA: Diagnosis not present

## 2020-04-02 DIAGNOSIS — Z8379 Family history of other diseases of the digestive system: Secondary | ICD-10-CM | POA: Diagnosis not present

## 2020-04-02 DIAGNOSIS — K219 Gastro-esophageal reflux disease without esophagitis: Secondary | ICD-10-CM | POA: Diagnosis not present

## 2020-04-02 DIAGNOSIS — R200 Anesthesia of skin: Secondary | ICD-10-CM | POA: Diagnosis not present

## 2020-04-02 DIAGNOSIS — R197 Diarrhea, unspecified: Secondary | ICD-10-CM | POA: Insufficient documentation

## 2020-04-02 DIAGNOSIS — D509 Iron deficiency anemia, unspecified: Secondary | ICD-10-CM | POA: Insufficient documentation

## 2020-04-02 LAB — COMPREHENSIVE METABOLIC PANEL
ALT: 33 U/L (ref 0–44)
AST: 31 U/L (ref 15–41)
Albumin: 3.9 g/dL (ref 3.5–5.0)
Alkaline Phosphatase: 66 U/L (ref 38–126)
Anion gap: 12 (ref 5–15)
BUN: 15 mg/dL (ref 8–23)
CO2: 29 mmol/L (ref 22–32)
Calcium: 9 mg/dL (ref 8.9–10.3)
Chloride: 97 mmol/L — ABNORMAL LOW (ref 98–111)
Creatinine, Ser: 0.72 mg/dL (ref 0.44–1.00)
GFR calc Af Amer: >60 mL/min (ref >60)
GFR calc non Af Amer: >60 mL/min (ref >60)
Glucose, Bld: 106 mg/dL — ABNORMAL HIGH (ref 70–99)
Potassium: 3.5 mmol/L (ref 3.5–5.1)
Sodium: 138 mmol/L (ref 135–145)
Total Bilirubin: 0.9 mg/dL (ref 0.3–1.2)
Total Protein: 6.9 g/dL (ref 6.5–8.1)

## 2020-04-02 LAB — IRON AND TIBC
Iron: 157 ug/dL (ref 28–170)
Saturation Ratios: 50 % — ABNORMAL HIGH (ref 10.4–31.8)
TIBC: 315 ug/dL (ref 250–450)
UIBC: 158 ug/dL

## 2020-04-02 LAB — VITAMIN B12: Vitamin B-12: 242 pg/mL (ref 180–914)

## 2020-04-02 LAB — CBC WITH DIFFERENTIAL/PLATELET
Abs Immature Granulocytes: 0.01 10*3/uL (ref 0.00–0.07)
Basophils Absolute: 0 10*3/uL (ref 0.0–0.1)
Basophils Relative: 1 %
Eosinophils Absolute: 0.2 10*3/uL (ref 0.0–0.5)
Eosinophils Relative: 3 %
HCT: 49.1 % — ABNORMAL HIGH (ref 36.0–46.0)
Hemoglobin: 16.8 g/dL — ABNORMAL HIGH (ref 12.0–15.0)
Immature Granulocytes: 0 %
Lymphocytes Relative: 31 %
Lymphs Abs: 1.9 10*3/uL (ref 0.7–4.0)
MCH: 36.4 pg — ABNORMAL HIGH (ref 26.0–34.0)
MCHC: 34.2 g/dL (ref 30.0–36.0)
MCV: 106.3 fL — ABNORMAL HIGH (ref 80.0–100.0)
Monocytes Absolute: 0.5 10*3/uL (ref 0.1–1.0)
Monocytes Relative: 8 %
Neutro Abs: 3.5 10*3/uL (ref 1.7–7.7)
Neutrophils Relative %: 57 %
Platelets: 256 10*3/uL (ref 150–400)
RBC: 4.62 MIL/uL (ref 3.87–5.11)
RDW: 12.3 % (ref 11.5–15.5)
WBC: 6.1 10*3/uL (ref 4.0–10.5)
nRBC: 0 % (ref 0.0–0.2)

## 2020-04-02 LAB — FERRITIN: Ferritin: 284 ng/mL (ref 11–307)

## 2020-04-02 LAB — FOLATE: Folate: 44.9 ng/mL (ref >5.9)

## 2020-04-03 ENCOUNTER — Other Ambulatory Visit: Payer: Self-pay

## 2020-04-04 ENCOUNTER — Other Ambulatory Visit: Payer: Self-pay

## 2020-04-04 ENCOUNTER — Encounter: Payer: Self-pay | Admitting: Family Medicine

## 2020-04-04 ENCOUNTER — Ambulatory Visit (INDEPENDENT_AMBULATORY_CARE_PROVIDER_SITE_OTHER): Payer: Medicare Other | Admitting: Family Medicine

## 2020-04-04 VITALS — BP 117/68 | HR 62 | Temp 97.2°F | Ht 65.0 in | Wt 148.2 lb

## 2020-04-04 DIAGNOSIS — I10 Essential (primary) hypertension: Secondary | ICD-10-CM | POA: Diagnosis not present

## 2020-04-04 DIAGNOSIS — F4323 Adjustment disorder with mixed anxiety and depressed mood: Secondary | ICD-10-CM | POA: Diagnosis not present

## 2020-04-04 DIAGNOSIS — E785 Hyperlipidemia, unspecified: Secondary | ICD-10-CM

## 2020-04-04 DIAGNOSIS — K219 Gastro-esophageal reflux disease without esophagitis: Secondary | ICD-10-CM | POA: Diagnosis not present

## 2020-04-04 MED ORDER — HYDROCHLOROTHIAZIDE 12.5 MG PO CAPS: 12.5000 mg | ORAL_CAPSULE | Freq: Every day | ORAL | 3 refills | Status: DC

## 2020-04-04 NOTE — Progress Notes (Signed)
BP 117/68   Pulse 62   Temp (!) 97.2 F (36.2 C) (Temporal)   Ht 5\' 5"  (1.651 m)   Wt 148 lb 4 oz (67.2 kg)   BMI 24.67 kg/m    Subjective:   Patient ID: Jody Wilson, female    DOB: 11/12/1951, 69 y.o.   MRN: [id number]  HPI: JMIYAH BOGDANOWICZ is a 69 y.o. female presenting on 04/04/2020 for Medical Management of Chronic Issues   HPI Anxiety recheck Current rx-alprazolam 0.5 mg twice daily as needed # meds rx-60 Effectiveness of current meds-works well Adverse reactions form meds-none  Pill count performed-No Last drug screen -09/18/2019 ( high risk q11m, moderate risk q35m, low risk yearly ) Urine drug screen today- No Was the [facility] reviewed-yes  If yes were their any concerning findings? -None  No flowsheet data found.   Controlled substance contract signed on: 09/18/2019  GERD Patient is currently on pantoprazole.  Patient is having a lot of issues with indigestion and upset stomach and recurrent diarrhea, will refer to gastroenterology  Hyperlipidemia Patient is coming in for recheck of his hyperlipidemia. The patient is currently taking no medication currently we are monitoring his doing diet controlled, has been intolerant of statin previously. They deny any issues with myalgias or history of liver damage from it. They deny any focal numbness or weakness or chest pain.   Hypertension Patient is currently on olmesartan and hydrochlorothiazide, and their blood pressure today is 117/68. Patient denies any lightheadedness or dizziness. Patient denies headaches, blurred vision, chest pains, shortness of breath, or weakness. Denies any side effects from medication and is content with current medication.   Relevant past medical, surgical, family and social history reviewed and updated as indicated. Interim medical history since our last visit reviewed. Allergies and medications reviewed and updated.  Review of Systems  Constitutional: Negative for chills and fever.    HENT: Negative for ear pain.   Eyes: Negative for visual disturbance.  Respiratory: Negative for chest tightness and shortness of breath.   Cardiovascular: Negative for chest pain and leg swelling.  Gastrointestinal: Negative for abdominal pain.  Musculoskeletal: Negative for back pain and gait problem.  Skin: Negative for rash.  Neurological: Negative for light-headedness and headaches.  Psychiatric/Behavioral: Negative for agitation and behavioral problems.  All other systems reviewed and are negative.   Per HPI unless specifically indicated above   Allergies as of 04/04/2020      Reactions   Tetracyclines & Related Itching, Swelling      Medication List       Accurate as of April 04, 2020 10:31 AM. If you have any questions, ask your nurse or doctor.        albuterol 108 (90 Base) MCG/ACT inhaler Commonly known as: VENTOLIN HFA Inhale 2 puffs into the lungs every 6 (six) hours as needed for wheezing or shortness of breath.   ALPRAZolam 0.5 MG tablet Commonly known as: XANAX Take 1 tablet (0.5 mg total) by mouth 2 (two) times daily as needed for anxiety.   calcium-vitamin D 500-200 MG-UNIT tablet Commonly known as: OSCAL WITH D Take 1 tablet by mouth 2 (two) times daily.   citalopram 40 MG tablet Commonly known as: CELEXA Take 1 tablet (40 mg total) by mouth daily. What changed: additional instructions   clobetasol 0.05 % external solution Commonly known as: TEMOVATE Apply 1 application topically 2 (two) times daily.   denosumab 60 MG/ML Sosy injection Commonly known as: PROLIA Inject 60 mg into  the skin every 6 (six) months.   hydrochlorothiazide 12.5 MG capsule Commonly known as: MICROZIDE Take 1 capsule (12.5 mg total) by mouth daily. What changed: See the new instructions. Changed by: Fransisca Kaufmann Serai Tukes, MD   mupirocin ointment 2 % Commonly known as: Bactroban Place 1 application into the nose 2 (two) times daily.   olmesartan 40 MG  tablet Commonly known as: BENICAR Take 1 tablet (40 mg total) by mouth daily.   pantoprazole 40 MG tablet Commonly known as: PROTONIX Take 1 tablet (40 mg total) by mouth daily.   tiZANidine 4 MG capsule Commonly known as: ZANAFLEX Take 1 capsule (4 mg total) by mouth 3 (three) times daily.   valACYclovir 1000 MG tablet Commonly known as: VALTREX Take 2 tablets (2,000 mg total) by mouth 2 (two) times daily. For 2 days   WOMENS 50+ MULTI VITAMIN/MIN PO Take by mouth.        Objective:   BP 117/68   Pulse 62   Temp (!) 97.2 F (36.2 C) (Temporal)   Ht 5\' 5"  (1.651 m)   Wt 148 lb 4 oz (67.2 kg)   BMI 24.67 kg/m   Wt Readings from Last 3 Encounters:  04/04/20 148 lb 4 oz (67.2 kg)  03/11/20 150 lb 9 oz (68.3 kg)  11/09/19 163 lb 3.2 oz (74 kg)    Physical Exam Vitals and nursing note reviewed.  Constitutional:      General: She is not in acute distress.    Appearance: She is well-developed. She is not diaphoretic.  Eyes:     Conjunctiva/sclera: Conjunctivae normal.  Cardiovascular:     Rate and Rhythm: Normal rate and regular rhythm.     Heart sounds: Normal heart sounds. No murmur.  Pulmonary:     Effort: Pulmonary effort is normal. No respiratory distress.     Breath sounds: Normal breath sounds. No wheezing.  Abdominal:     General: Abdomen is flat. Bowel sounds are normal. There is no distension.     Tenderness: There is abdominal tenderness (Epigastric tenderness and right and left lower quadrant tenderness, mild).  Skin:    General: Skin is warm and dry.     Findings: No rash.  Neurological:     Mental Status: She is alert and oriented to person, place, and time.     Coordination: Coordination normal.  Psychiatric:        Behavior: Behavior normal.       Assessment & Plan:   Problem List Items Addressed This Visit      Cardiovascular and Mediastinum   Hypertension - Primary   Relevant Medications   hydrochlorothiazide (MICROZIDE) 12.5 MG  capsule     Digestive   GERD (gastroesophageal reflux disease)   Relevant Orders   Ambulatory referral to Gastroenterology     Other   Adjustment disorder with mixed anxiety and depressed mood   Hyperlipidemia with target LDL less than 100   Relevant Medications   hydrochlorothiazide (MICROZIDE) 12.5 MG capsule      Continue current medications, will refer to gastroenterology for abdominal issues Follow up plan: Return in about 3 months (around 07/04/2020), or if symptoms worsen or fail to improve, for GERD and cholesterol and anxiety.  Counseling provided for all of the vaccine components Orders Placed This Encounter  Procedures  . Ambulatory referral to Gastroenterology    [name], MD [facility] 04/04/2020, 10:31 AM

## 2020-04-09 ENCOUNTER — Inpatient Hospital Stay (HOSPITAL_BASED_OUTPATIENT_CLINIC_OR_DEPARTMENT_OTHER): Payer: Medicare Other | Admitting: Hematology

## 2020-04-09 ENCOUNTER — Other Ambulatory Visit: Payer: Self-pay

## 2020-04-09 ENCOUNTER — Encounter (HOSPITAL_COMMUNITY): Payer: Self-pay | Admitting: Hematology

## 2020-04-09 VITALS — BP 141/85 | HR 76 | Temp 96.9°F | Resp 18 | Wt 149.9 lb

## 2020-04-09 DIAGNOSIS — E538 Deficiency of other specified B group vitamins: Secondary | ICD-10-CM | POA: Diagnosis not present

## 2020-04-09 DIAGNOSIS — D509 Iron deficiency anemia, unspecified: Secondary | ICD-10-CM | POA: Diagnosis not present

## 2020-04-09 DIAGNOSIS — M810 Age-related osteoporosis without current pathological fracture: Secondary | ICD-10-CM | POA: Diagnosis not present

## 2020-04-09 DIAGNOSIS — R200 Anesthesia of skin: Secondary | ICD-10-CM | POA: Diagnosis not present

## 2020-04-09 DIAGNOSIS — R197 Diarrhea, unspecified: Secondary | ICD-10-CM | POA: Diagnosis not present

## 2020-04-09 DIAGNOSIS — R634 Abnormal weight loss: Secondary | ICD-10-CM | POA: Diagnosis not present

## 2020-04-09 NOTE — Assessment & Plan Note (Addendum)
1.  Normocytic anemia: -Last colonoscopy by Dr. Paulita Fujita to [facility] gastroenterology in [place] in 2015, small polyp removed. -She received Feraheme infusion on 03/17/2020. -We discussed labs from 04/02/2020.  Hemoglobin improved to 16.8.  Ferritin is 284.  Percent saturation is 50.  Creatinine is 0.72. -We will see her back in 3 months with repeat labs.  2.  Vitamin B12 deficiency: -B12 is normal.  She will continue B12 1 mg daily.  3.  Folic acid deficiency: -Folic acid is normal.  She will continue 1 mg tablet daily.  4.  Osteoporosis: -DEXA scan on 01/31/2017 showed T score of -2.9.  She is tolerating Prolia very well.  5.  Tobacco abuse: -She smoked 1 pack/day for 40 years.  I will refer for lung cancer screening program.  6.  Elevated LFTs: -Her LFTs which were elevated last time have normalized.  7.  Weight loss: -She lost about 17 pounds since October of last year. -She has been having loose/watery stools and has been unable to eat properly.  She has an appointment to see GI on 04/29/2020. -If GI evaluation does not reveal the cause of weight loss, will consider doing CT of the abdomen and pelvis.

## 2020-04-09 NOTE — Patient Instructions (Signed)
[place] at [facility] Discharge Instructions  You were seen today by Dr. Delton Coombes. He went over your recent lab results. He will refer you to our lung cancer screening program for a low dose CT. He will see you back in 3 months for labs and follow up.   Thank you for choosing [place] at [facility] to provide your oncology and hematology care.  To afford each patient quality time with our provider, please arrive at least 15 minutes before your scheduled appointment time.   If you have a lab appointment with the [facility] please come in thru the  Main Entrance and check in at the main information desk  You need to re-schedule your appointment should you arrive 10 or more minutes late.  We strive to give you quality time with our providers, and arriving late affects you and other patients whose appointments are after yours.  Also, if you no show three or more times for appointments you may be dismissed from the clinic at the providers discretion.     Again, thank you for choosing [facility].  Our hope is that these requests will decrease the amount of time that you wait before being seen by our physicians.       _____________________________________________________________  Should you have questions after your visit to [facility], please contact our office at [phone] between the hours of 8:00 a.m. and 4:30 p.m.  Voicemails left after 4:00 p.m. will not be returned until the following business day.  For prescription refill requests, have your pharmacy contact our office and allow 72 hours.    Cancer Center Support Programs:   > Cancer Support Group  2nd Tuesday of the month 1pm-2pm, Journey Room

## 2020-04-09 NOTE — Progress Notes (Signed)
Jody Wilson, Jody Wilson   CLINIC:  Medical Oncology/Hematology  PCP:  Jody Wilson, Jody Kaufmann, Jody Wilson Jody Wilson [phone]   REASON FOR VISIT:  Follow-up for normocytic anemia.  CURRENT THERAPY: Intermittent Feraheme, daily 123456 and folic acid supplements.   INTERVAL HISTORY:  Jody Wilson 69 y.o. female seen for follow-up of normocytic anemia, vitamin 123456 deficiency, folic acid deficiency, tobacco abuse and weight loss.  Reports appetite and energy levels have improved after her last Feraheme infusion on 03/17/2020.  They reported 50%.  Numbness in the fingertips and toes has been stable.  Patient has been unable to eat well and has intermittent diarrhea.  She reports food runs through her.  She lost 18 pounds in the last 7 months.    REVIEW OF SYSTEMS:  Review of Systems  Gastrointestinal: Positive for diarrhea.  Neurological: Positive for dizziness and numbness.  All other systems reviewed and are negative.    PAST MEDICAL/SURGICAL HISTORY:  Past Medical History:  Diagnosis Date  . Anemia   . Anxiety   . Depression   . GERD (gastroesophageal reflux disease)   . Hyperlipidemia   . Hypertension   . Osteoporosis    Past Surgical History:  Procedure Laterality Date  . ABDOMINAL HYSTERECTOMY    . BACK SURGERY    . CHOLECYSTECTOMY    . FOOT SURGERY     7 in the past  . IR GENERIC HISTORICAL  12/09/2016   IR RADIOLOGIST EVAL & MGMT 12/09/2016 [facility]  . IR GENERIC HISTORICAL  12/10/2016   IR VERTEBROPLASTY CERV/THOR BX INC UNI/BIL INC/INJECT/IMAGING 12/10/2016 [facility]  . TRACHEOSTOMY TUBE PLACEMENT  1982     SOCIAL HISTORY:  Social History   Socioeconomic History  . Marital status: Married    Spouse name: Donnie  . Number of children: 3  . Years of education: 15  . Highest education level: Associate degree: occupational, Hotel manager, or vocational program  Occupational History  .  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  Tobacco Use  . Smoking status: Current Every Day Smoker    Packs/day: 0.25    Types: Cigarettes  . Smokeless tobacco: Never Used  . Tobacco comment: Trying to cut on number of cigarettes she smokes.  Substance and Sexual Activity  . Alcohol use: Yes    Comment: occassionally   . Drug use: No  . Sexual activity: Not on file  Other Topics Concern  . Not on file  Social History Narrative   Lives with her husband, [name], and has two grown sons and two grand sons.   She visits family and has a once weekly outing with a friend for socializing.  She enjoys making beaded jewelry, reading and walking..   Social Determinants of Health   Financial Resource Strain: Low Risk   . Difficulty of Paying Living Expenses: Not hard at all  Food Insecurity: No Food Insecurity  . Worried About Charity fundraiser in the Last Year: Never true  . Ran Out of Food in the Last Year: Never true  Transportation Needs: No Transportation Needs  . Lack of Transportation (Medical): No  . Lack of Transportation (Non-Medical): No  Physical Activity: Sufficiently Active  . Days of Exercise per Week: 5 days  . Minutes of Exercise per Session: 60 min  Stress:   . Feeling of Stress :   Social  Connections: Not Isolated  . Frequency of Communication with Friends and Family: Twice a week  . Frequency of Social Gatherings with Friends and Family: Once a week  . Attends Religious Services: More than 4 times per year  . Active Member of Clubs or Organizations: Yes  . Attends Archivist Meetings: More than 4 times per year  . Marital Status: Married  Human resources officer Violence: Not At Risk  . Fear of Current or Ex-Partner: No  . Emotionally Abused: No  . Physically Abused: No  . Sexually Abused: No    FAMILY HISTORY:  Family History  Problem Relation Age of Onset  . Heart disease Mother   .  Hypertension Mother   . Hyperlipidemia Mother   . Diabetes Mother   . Congestive Heart Failure Mother   . Hypertension Son   . Other Son 13       Kienbock's Disease  . Liver disease Son 36       Gilbert's Disease  . Aneurysm Paternal Uncle   . Heart attack Maternal Grandmother   . Heart attack Maternal Grandfather   . Lupus Half-Sister     CURRENT MEDICATIONS:  Outpatient Encounter Medications as of 04/09/2020  Medication Sig  . ALPRAZolam (XANAX) 0.5 MG tablet Take 1 tablet (0.5 mg total) by mouth 2 (two) times daily as needed for anxiety.  . calcium-vitamin D (OSCAL WITH D) 500-200 MG-UNIT tablet Take 1 tablet by mouth 2 (two) times daily.   . citalopram (CELEXA) 40 MG tablet Take 1 tablet (40 mg total) by mouth daily. (Patient taking differently: Take 40 mg by mouth daily. 1/2 tab per day)  . clobetasol (TEMOVATE) 0.05 % external solution Apply 1 application topically 2 (two) times daily.  Marland Kitchen denosumab (PROLIA) 60 MG/ML SOSY injection Inject 60 mg into the skin every 6 (six) months.  . hydrochlorothiazide (MICROZIDE) 12.5 MG capsule Take 1 capsule (12.5 mg total) by mouth daily.  . Multiple Vitamins-Minerals (WOMENS 50+ MULTI VITAMIN/MIN PO) Take by mouth.  . mupirocin ointment (BACTROBAN) 2 % Place 1 application into the nose 2 (two) times daily.  Marland Kitchen olmesartan (BENICAR) 40 MG tablet Take 1 tablet (40 mg total) by mouth daily.  . pantoprazole (PROTONIX) 40 MG tablet Take 1 tablet (40 mg total) by mouth daily.  Marland Kitchen tiZANidine (ZANAFLEX) 4 MG capsule Take 1 capsule (4 mg total) by mouth 3 (three) times daily.  . valACYclovir (VALTREX) 1000 MG tablet Take 2 tablets (2,000 mg total) by mouth 2 (two) times daily. For 2 days  . albuterol (PROVENTIL HFA;VENTOLIN HFA) 108 (90 Base) MCG/ACT inhaler Inhale 2 puffs into the lungs every 6 (six) hours as needed for wheezing or shortness of breath. (Patient not taking: Reported on 04/09/2020)   Facility-Administered Encounter Medications as of  04/09/2020  Medication  . denosumab (PROLIA) injection 60 mg  . denosumab (PROLIA) injection 60 mg    ALLERGIES:  Allergies  Allergen Reactions  . Tetracyclines & Related Itching and Swelling     PHYSICAL EXAM:  ECOG Performance status: 1  Vitals:   04/09/20 1512  BP: (!) 141/85  Pulse: 76  Resp: 18  Temp: (!) 96.9 F (36.1 C)  SpO2: 97%   Filed Weights   04/09/20 1512  Weight: 149 lb 14.4 oz (68 kg)    Physical Exam Vitals reviewed.  Constitutional:      Appearance: Normal appearance.  Cardiovascular:     Rate and Rhythm: Normal rate and regular rhythm.  Heart sounds: Normal heart sounds.  Pulmonary:     Effort: Pulmonary effort is normal.     Breath sounds: Normal breath sounds.  Abdominal:     General: There is no distension.     Palpations: Abdomen is soft. There is no mass.  Skin:    General: Skin is warm.  Neurological:     General: No focal deficit present.     Mental Status: She is alert and oriented to person, place, and time.  Psychiatric:        Mood and Affect: Mood normal.      LABORATORY DATA:  I have reviewed the labs as listed.  CBC    Component Value Date/Time   WBC 6.1 04/02/2020 1027   RBC 4.62 04/02/2020 1027   HGB 16.8 (H) 04/02/2020 1027   HGB 11.6 09/07/2019 0903   HCT 49.1 (H) 04/02/2020 1027   HCT 35.8 09/07/2019 0903   PLT 256 04/02/2020 1027   PLT 395 09/07/2019 0903   MCV 106.3 (H) 04/02/2020 1027   MCV 91 09/07/2019 0903   MCH 36.4 (H) 04/02/2020 1027   MCHC 34.2 04/02/2020 1027   RDW 12.3 04/02/2020 1027   RDW 14.3 09/07/2019 0903   LYMPHSABS 1.9 04/02/2020 1027   LYMPHSABS 1.7 09/07/2019 0903   MONOABS 0.5 04/02/2020 1027   EOSABS 0.2 04/02/2020 1027   EOSABS 0.1 09/07/2019 0903   BASOSABS 0.0 04/02/2020 1027   BASOSABS 0.1 09/07/2019 0903   CMP Latest Ref Rng & Units 04/02/2020 03/04/2020 09/07/2019  Glucose 70 - 99 mg/dL 106(H) 107(H) 106(H)  BUN 8 - 23 mg/dL 15 18 13   Creatinine 0.44 - 1.00 mg/dL 0.72  0.77 0.84  Sodium 135 - 145 mmol/L 138 140 137  Potassium 3.5 - 5.1 mmol/L 3.5 3.7 4.1  Chloride 98 - 111 mmol/L 97(L) 99 98  CO2 22 - 32 mmol/L 29 30 26   Calcium 8.9 - 10.3 mg/dL 9.0 9.2 8.9  Total Protein 6.5 - 8.1 g/dL 6.9 7.3 6.6  Total Bilirubin 0.3 - 1.2 mg/dL 0.9 0.9 0.3  Alkaline Phos 38 - 126 U/L 66 68 87  AST 15 - 41 U/L 31 50(H) 37  ALT 0 - 44 U/L 33 46(H) 33(H)       DIAGNOSTIC IMAGING:  I have reviewed scans.    ASSESSMENT & PLAN:   Iron deficiency anemia 1.  Normocytic anemia: -Last colonoscopy by Dr. Paulita Fujita to [facility] in [place] in 2015, small polyp removed. -She received Feraheme infusion on 03/17/2020. -We discussed labs from 04/02/2020.  Hemoglobin improved to 16.8.  Ferritin is 284.  Percent saturation is 50.  Creatinine is 0.72. -We will see her back in 3 months with repeat labs.  2.  Vitamin B12 deficiency: -B12 is normal.  She will continue B12 1 mg daily.  3.  Folic acid deficiency: -Folic acid is normal.  She will continue 1 mg tablet daily.  4.  Osteoporosis: -DEXA scan on 01/31/2017 showed T score of -2.9.  She is tolerating Prolia very well.  5.  Tobacco abuse: -She smoked 1 pack/day for 40 years.  I will refer for lung cancer screening program.  6.  Elevated LFTs: -Her LFTs which were elevated last time have normalized.  7.  Weight loss: -She lost about 17 pounds since October of last year. -She has been having loose/watery stools and has been unable to eat properly.  She has an appointment to see GI on 04/29/2020. -If GI evaluation does not reveal  the cause of weight loss, will consider doing CT of the abdomen and pelvis.      Orders placed this encounter:  Orders Placed This Encounter  Procedures  . CBC with Differential/Platelet  . Comprehensive metabolic panel  . Iron and TIBC  . Ferritin  . Erythropoietin  . Vitamin B12      Derek Jack, [place] [phone]

## 2020-04-11 ENCOUNTER — Other Ambulatory Visit (HOSPITAL_COMMUNITY): Payer: Self-pay | Admitting: *Deleted

## 2020-04-11 ENCOUNTER — Encounter (HOSPITAL_COMMUNITY): Payer: Self-pay | Admitting: *Deleted

## 2020-04-11 DIAGNOSIS — Z87891 Personal history of nicotine dependence: Secondary | ICD-10-CM

## 2020-04-11 DIAGNOSIS — Z122 Encounter for screening for malignant neoplasm of respiratory organs: Secondary | ICD-10-CM

## 2020-04-11 NOTE — Progress Notes (Signed)
Received referral for initial lung cancer screening scan.  Contacted patient and obtained smoking history (started age 69, current smoker, 40 pack year) as well as answering questions related to the screening process.  Patient denies signs/symptoms of lung cancer such as weight loss or hemoptysis.  Patient denies comorbidity that would prevent curative treatment if lung cancer were to be found.  Patient is scheduled for shared decision making visit and CT scan on 04/25/2020 at 1010.

## 2020-04-11 NOTE — Progress Notes (Signed)
Orders placed for LDCT per Dr. Delton Coombes.

## 2020-04-12 ENCOUNTER — Other Ambulatory Visit: Payer: Self-pay | Admitting: Family Medicine

## 2020-04-12 DIAGNOSIS — F4323 Adjustment disorder with mixed anxiety and depressed mood: Secondary | ICD-10-CM

## 2020-04-14 MED ORDER — ALPRAZOLAM 0.5 MG PO TABS: 0.5000 mg | ORAL_TABLET | Freq: Two times a day (BID) | ORAL | 2 refills | Status: DC | PRN

## 2020-04-14 NOTE — Addendum Note (Signed)
Addended by: [name] on: 04/14/2020 09:48 AM   Modules accepted: Orders

## 2020-04-14 NOTE — Telephone Encounter (Signed)
I refilled the medication but for now on she needs to make sure that she ask for in a visit, will not be refilled outside of visits, as she is due close for refill in the future she needs to ask she for it in the visit.

## 2020-04-25 ENCOUNTER — Inpatient Hospital Stay (HOSPITAL_COMMUNITY): Payer: Medicare Other | Attending: Hematology | Admitting: Nurse Practitioner

## 2020-04-25 ENCOUNTER — Other Ambulatory Visit: Payer: Self-pay

## 2020-04-25 ENCOUNTER — Ambulatory Visit (HOSPITAL_COMMUNITY)
Admission: RE | Admit: 2020-04-25 | Discharge: 2020-04-25 | Disposition: A | Discharge status: Home / Self Care | Payer: Medicare Other | Source: Ambulatory Visit | Attending: Nurse Practitioner | Admitting: Nurse Practitioner

## 2020-04-25 DIAGNOSIS — Z122 Encounter for screening for malignant neoplasm of respiratory organs: Secondary | ICD-10-CM | POA: Diagnosis not present

## 2020-04-25 DIAGNOSIS — Z87891 Personal history of nicotine dependence: Secondary | ICD-10-CM | POA: Diagnosis not present

## 2020-04-25 DIAGNOSIS — Z720 Tobacco use: Secondary | ICD-10-CM

## 2020-04-25 DIAGNOSIS — F17210 Nicotine dependence, cigarettes, uncomplicated: Secondary | ICD-10-CM | POA: Diagnosis not present

## 2020-04-25 NOTE — Progress Notes (Signed)
[facility] NOTE  Patient Care Team: Dettinger, Fransisca Kaufmann, MD as PCP - General (Family Medicine) Steffanie Rainwater, DPM as Consulting Physician (Podiatry) Arvella Nigh, MD as Consulting Physician (Obstetrics and Gynecology) Mothershed, Mamie Laurel., DPM (Podiatry) Derek Jack, MD as Consulting Physician (Hematology)  CHIEF COMPLAINTS/PURPOSE OF CONSULTATION: Shared decision making visit for lung cancer screening  HISTORY OF PRESENTING ILLNESS:  Jody Wilson 69 y.o. female presents today for shared decision making visit for lung cancer screening.  She has a past medical history for anemia, depression, GERD, hyperlipidemia, and hypertension.  Patient states she started smoking at the age of 68.  At the most she smoked 2 packs/day.  She currently is still smoking under a pack a day.  She does report occasional cough without sputum production.  She denies any alcohol or illicit drug use.  She has a family history positive for a maternal niece with breast cancer and a maternal aunt with breast cancer.  MEDICAL HISTORY:  Past Medical History:  Diagnosis Date  . Anemia   . Anxiety   . Depression   . GERD (gastroesophageal reflux disease)   . Hyperlipidemia   . Hypertension   . Osteoporosis     SURGICAL HISTORY: Past Surgical History:  Procedure Laterality Date  . ABDOMINAL HYSTERECTOMY    . BACK SURGERY    . CHOLECYSTECTOMY    . FOOT SURGERY     7 in the past  . IR GENERIC HISTORICAL  12/09/2016   IR RADIOLOGIST EVAL & MGMT 12/09/2016 [facility]  . IR GENERIC HISTORICAL  12/10/2016   IR VERTEBROPLASTY CERV/THOR BX INC UNI/BIL INC/INJECT/IMAGING 12/10/2016 [facility]  . TRACHEOSTOMY TUBE PLACEMENT  1982    SOCIAL HISTORY: Social History   Socioeconomic History  . Marital status: Married    Spouse name: Donnie  . Number of children: 3  . Years of education: 11  . Highest education level: Associate degree: occupational, Hotel manager, or vocational  program  Occupational History  . 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  Tobacco Use  . Smoking status: Current Every Day Smoker    Packs/day: 0.25    Types: Cigarettes  . Smokeless tobacco: Never Used  . Tobacco comment: Trying to cut on number of cigarettes she smokes.  Substance and Sexual Activity  . Alcohol use: Yes    Comment: occassionally   . Drug use: No  . Sexual activity: Not on file  Other Topics Concern  . Not on file  Social History Narrative   Lives with her husband, [name], and has two grown sons and two grand sons.   She visits family and has a once weekly outing with a friend for socializing.  She enjoys making beaded jewelry, reading and walking..   Social Determinants of Health   Financial Resource Strain: Low Risk   . Difficulty of Paying Living Expenses: Not hard at all  Food Insecurity: No Food Insecurity  . Worried About Charity fundraiser in the Last Year: Never true  . Ran Out of Food in the Last Year: Never true  Transportation Needs: No Transportation Needs  . Lack of Transportation (Medical): No  . Lack of Transportation (Non-Medical): No  Physical Activity: Sufficiently Active  . Days of Exercise per Week: 5 days  . Minutes of Exercise per Session: 60 min  Stress:   . Feeling of Stress :   Social Connections: Not Isolated  .  Frequency of Communication with Friends and Family: Twice a week  . Frequency of Social Gatherings with Friends and Family: Once a week  . Attends Religious Services: More than 4 times per year  . Active Member of Clubs or Organizations: Yes  . Attends Archivist Meetings: More than 4 times per year  . Marital Status: Married  Human resources officer Violence: Not At Risk  . Fear of Current or Ex-Partner: No  . Emotionally Abused: No  . Physically Abused: No  . Sexually Abused: No    FAMILY HISTORY: Family History  Problem Relation Age of Onset   . Heart disease Mother   . Hypertension Mother   . Hyperlipidemia Mother   . Diabetes Mother   . Congestive Heart Failure Mother   . Hypertension Son   . Other Son 44       Kienbock's Disease  . Liver disease Son 25       Gilbert's Disease  . Aneurysm Paternal Uncle   . Heart attack Maternal Grandmother   . Heart attack Maternal Grandfather   . Lupus Half-Sister     ALLERGIES:  is allergic to tetracyclines & related.  MEDICATIONS:  Current Outpatient Medications  Medication Sig Dispense Refill  . albuterol (PROVENTIL HFA;VENTOLIN HFA) 108 (90 Base) MCG/ACT inhaler Inhale 2 puffs into the lungs every 6 (six) hours as needed for wheezing or shortness of breath. (Patient not taking: Reported on 04/09/2020) 1 Inhaler 2  . ALPRAZolam (XANAX) 0.5 MG tablet Take 1 tablet (0.5 mg total) by mouth 2 (two) times daily as needed for anxiety. 60 tablet 2  . calcium-vitamin D (OSCAL WITH D) 500-200 MG-UNIT tablet Take 1 tablet by mouth 2 (two) times daily.     . citalopram (CELEXA) 40 MG tablet Take 1 tablet (40 mg total) by mouth daily. (Patient taking differently: Take 40 mg by mouth daily. 1/2 tab per day) 90 tablet 3  . clobetasol (TEMOVATE) 0.05 % external solution Apply 1 application topically 2 (two) times daily. 50 mL 3  . denosumab (PROLIA) 60 MG/ML SOSY injection Inject 60 mg into the skin every 6 (six) months. 2 mL 0  . hydrochlorothiazide (MICROZIDE) 12.5 MG capsule Take 1 capsule (12.5 mg total) by mouth daily. 90 capsule 3  . Multiple Vitamins-Minerals (WOMENS 50+ MULTI VITAMIN/MIN PO) Take by mouth.    . mupirocin ointment (BACTROBAN) 2 % Place 1 application into the nose 2 (two) times daily. 22 g 0  . olmesartan (BENICAR) 40 MG tablet Take 1 tablet (40 mg total) by mouth daily. 90 tablet 1  . pantoprazole (PROTONIX) 40 MG tablet Take 1 tablet (40 mg total) by mouth daily. 90 tablet 3  . tiZANidine (ZANAFLEX) 4 MG capsule Take 1 capsule (4 mg total) by mouth 3 (three) times daily.  90 capsule 2  . valACYclovir (VALTREX) 1000 MG tablet Take 2 tablets (2,000 mg total) by mouth 2 (two) times daily. For 2 days 8 tablet 10   Current Facility-Administered Medications  Medication Dose Route Frequency Provider Last Rate Last Admin  . denosumab (PROLIA) injection 60 mg  60 mg Subcutaneous Q6 months Dettinger, Fransisca Kaufmann, MD   60 mg at 11/14/19 1553  . denosumab (PROLIA) injection 60 mg  60 mg Subcutaneous Once Dettinger, Fransisca Kaufmann, MD        LABORATORY DATA:  I have reviewed the data as listed Lab Results  Component Value Date   WBC 6.1 04/02/2020   HGB 16.8 (H) 04/02/2020  HCT 49.1 (H) 04/02/2020   MCV 106.3 (H) 04/02/2020   PLT 256 04/02/2020     Chemistry      Component Value Date/Time   NA 138 04/02/2020 1027   NA 137 09/07/2019 0903   K 3.5 04/02/2020 1027   CL 97 (L) 04/02/2020 1027   CO2 29 04/02/2020 1027   BUN 15 04/02/2020 1027   BUN 13 09/07/2019 0903   CREATININE 0.72 04/02/2020 1027      Component Value Date/Time   CALCIUM 9.0 04/02/2020 1027   ALKPHOS 66 04/02/2020 1027   AST 31 04/02/2020 1027   ALT 33 04/02/2020 1027   BILITOT 0.9 04/02/2020 1027   BILITOT 0.3 09/07/2019 [id number]       All questions were answered to patient's stated satisfaction. Encouraged patient to call with any new concerns or questions before his next visit to the cancer center and we can certain see him sooner, if needed.     ASSESSMENT & PLAN:  Tobacco abuse 1.  Tobacco abuse: -This patient meets criteria for low-dose CT lung cancer screening. -She is asymptomatic for any signs and symptoms of lung cancer. -This shared decision making visit discussion includes risk and benefits of screening, potential for follow-up, diagnostic testing for abnormal scans, potential for false positive test, over diagnosis, and discussion about total radiation exposure. -The patient stated willingness to undergo diagnostics and treatment as needed. -The patient was counseled on smoking  cessation to decrease risk of lung cancer, pulmonary disease, heart disease and stroke. -Patient was given resources card with information on receiving free nicotine replacement therapy and information about free smoking cessation classes. -Patient will present for LD CT scan today and follow-up with PCP.  No orders of the defined types were placed in this encounter.   All questions were answered. The patient knows to call the clinic with any problems, questions or concerns. I spent 10 minutes counseling the patient face to face. The total time spent in the appointment was 20 minutes and more than 50% was on counseling.     Glennie Isle, NP-C 04/25/2020 10:20 AM

## 2020-04-25 NOTE — Assessment & Plan Note (Signed)
1.  Tobacco abuse: -This patient meets criteria for low-dose CT lung cancer screening. -She is asymptomatic for any signs and symptoms of lung cancer. -This shared decision making visit discussion includes risk and benefits of screening, potential for follow-up, diagnostic testing for abnormal scans, potential for false positive test, over diagnosis, and discussion about total radiation exposure. -The patient stated willingness to undergo diagnostics and treatment as needed. -The patient was counseled on smoking cessation to decrease risk of lung cancer, pulmonary disease, heart disease and stroke. -Patient was given resources card with information on receiving free nicotine replacement therapy and information about free smoking cessation classes. -Patient will present for LD CT scan today and follow-up with PCP.

## 2020-04-25 NOTE — Patient Instructions (Signed)
You were seen today for your shared decision making visit and a low-dose CT scan for lung cancer screening.    

## 2020-04-28 ENCOUNTER — Other Ambulatory Visit (HOSPITAL_COMMUNITY): Payer: Self-pay | Admitting: *Deleted

## 2020-04-28 ENCOUNTER — Encounter (HOSPITAL_COMMUNITY): Payer: Self-pay | Admitting: *Deleted

## 2020-04-28 ENCOUNTER — Other Ambulatory Visit: Payer: Self-pay | Admitting: Hematology

## 2020-04-28 ENCOUNTER — Other Ambulatory Visit (HOSPITAL_COMMUNITY): Payer: Self-pay | Admitting: Hematology

## 2020-04-28 DIAGNOSIS — R9389 Abnormal findings on diagnostic imaging of other specified body structures: Secondary | ICD-10-CM

## 2020-04-28 NOTE — Progress Notes (Signed)
Ct lung result showed a heterogeneous low density within the central liver - needs MRI w/without contrast to further evaluate.  Dr. Raliegh Ip agreeable and wants MRI ordered. CT results given to Mercer and reviewed with her. She will get the scan ordered for patient.

## 2020-04-28 NOTE — Progress Notes (Signed)
Patient returned my call to review results of CT scan.  She was advised of the findings and agrees to the recommendations of further scans.  She was advised of the date and time of her appt.  I explained that I will mail her results to her with instructions for her MRI.  She verbalizes understanding.

## 2020-04-28 NOTE — Progress Notes (Signed)
I called patient to notify her of finding on lung screening CT this morning and to notify of the incidental findings requiring additional follow up.  She was driving and couldn't talk at this time.  I asked that she call me back as soon as she can to talk about the results. She verbalizes understanding.    I have sent a message to her PCP and advised of the findings.    IMPRESSION: Lung-RADS 1S, negative. Continue annual screening with low-dose chest CT without contrast in 12 months.  A Lung-RADS "S" modifier is used due to the presence of a potentially clinically significant finding, in this case, heterogeneous low density within the central liver, worrisome for infiltrating mass, although benign etiology such as hepatic steatosis could also have this appearance. MRI abdomen with/without contrast is suggested for further evaluation.  These results will be called to the ordering clinician or representative by the Radiologist Assistant, and communication documented in the PACS or [employer].  Aortic Atherosclerosis (ICD10-I70.0) and Emphysema (ICD10-J43.9).

## 2020-05-20 ENCOUNTER — Other Ambulatory Visit (HOSPITAL_COMMUNITY): Payer: Medicare Other

## 2020-05-26 DIAGNOSIS — D509 Iron deficiency anemia, unspecified: Secondary | ICD-10-CM | POA: Diagnosis not present

## 2020-05-26 DIAGNOSIS — R197 Diarrhea, unspecified: Secondary | ICD-10-CM | POA: Diagnosis not present

## 2020-05-26 DIAGNOSIS — R932 Abnormal findings on diagnostic imaging of liver and biliary tract: Secondary | ICD-10-CM | POA: Diagnosis not present

## 2020-05-26 DIAGNOSIS — K219 Gastro-esophageal reflux disease without esophagitis: Secondary | ICD-10-CM | POA: Diagnosis not present

## 2020-05-26 DIAGNOSIS — R634 Abnormal weight loss: Secondary | ICD-10-CM | POA: Diagnosis not present

## 2020-05-29 DIAGNOSIS — R197 Diarrhea, unspecified: Secondary | ICD-10-CM | POA: Diagnosis not present

## 2020-05-29 DIAGNOSIS — R634 Abnormal weight loss: Secondary | ICD-10-CM | POA: Diagnosis not present

## 2020-06-03 ENCOUNTER — Telehealth (HOSPITAL_COMMUNITY): Payer: Self-pay | Admitting: *Deleted

## 2020-06-03 ENCOUNTER — Ambulatory Visit (HOSPITAL_COMMUNITY)
Admission: RE | Admit: 2020-06-03 | Discharge: 2020-06-03 | Disposition: A | Discharge status: Home / Self Care | Payer: Medicare Other | Source: Ambulatory Visit | Attending: Hematology | Admitting: Hematology

## 2020-06-03 ENCOUNTER — Other Ambulatory Visit: Payer: Self-pay

## 2020-06-03 DIAGNOSIS — R9389 Abnormal findings on diagnostic imaging of other specified body structures: Secondary | ICD-10-CM | POA: Diagnosis not present

## 2020-06-03 MED ORDER — GADOBUTROL 1 MMOL/ML IV SOLN
7.0000 mL | Freq: Once | INTRAVENOUS | Status: AC | PRN
  Administered 2020-06-03: 7 mL via INTRAVENOUS

## 2020-06-03 NOTE — Telephone Encounter (Signed)
Call report from [facility] Radiology re:  MRI abd results.  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  Results reviewed with Cristela Felt, NP, who recommends referral to vascular surgeon to follow.  Pt contacted to inform her of this plan.  Referral made by A. Nance, Marketing executive, to [facility] in [place].

## 2020-06-06 ENCOUNTER — Telehealth: Payer: Self-pay | Admitting: Family Medicine

## 2020-06-10 NOTE — Telephone Encounter (Signed)
Pt scheduled for 06/13/20 at 9:30 with the Triage Nurse.

## 2020-06-10 NOTE — Telephone Encounter (Signed)
LMTCB to schedule her Prolia injection-she is due and can be scheduled with Triage whenever. Her Prolia injection is in the fridge in the lab without her name on it so it is "[place]"

## 2020-06-13 ENCOUNTER — Ambulatory Visit (INDEPENDENT_AMBULATORY_CARE_PROVIDER_SITE_OTHER): Payer: Medicare Other

## 2020-06-13 ENCOUNTER — Other Ambulatory Visit: Payer: Self-pay

## 2020-06-13 DIAGNOSIS — M810 Age-related osteoporosis without current pathological fracture: Secondary | ICD-10-CM | POA: Diagnosis not present

## 2020-06-13 DIAGNOSIS — M8000XD Age-related osteoporosis with current pathological fracture, unspecified site, subsequent encounter for fracture with routine healing: Secondary | ICD-10-CM

## 2020-06-13 NOTE — Progress Notes (Signed)
Prolia injection given to left arm. Patient tolerated well. Stanley

## 2020-06-21 ENCOUNTER — Other Ambulatory Visit: Payer: Self-pay | Admitting: Family Medicine

## 2020-06-21 DIAGNOSIS — I10 Essential (primary) hypertension: Secondary | ICD-10-CM

## 2020-06-22 ENCOUNTER — Other Ambulatory Visit: Payer: Self-pay

## 2020-06-22 ENCOUNTER — Emergency Department (HOSPITAL_COMMUNITY): Payer: Medicare Other

## 2020-06-22 ENCOUNTER — Inpatient Hospital Stay (HOSPITAL_COMMUNITY)
Admission: EM | Admit: 2020-06-22 | Discharge: 2020-06-24 | DRG: 312 | Disposition: A | Discharge status: Home / Self Care | Payer: Medicare Other | Attending: Internal Medicine | Admitting: Internal Medicine

## 2020-06-22 DIAGNOSIS — K449 Diaphragmatic hernia without obstruction or gangrene: Secondary | ICD-10-CM

## 2020-06-22 DIAGNOSIS — E785 Hyperlipidemia, unspecified: Secondary | ICD-10-CM | POA: Diagnosis present

## 2020-06-22 DIAGNOSIS — I700 Atherosclerosis of aorta: Secondary | ICD-10-CM

## 2020-06-22 DIAGNOSIS — F17210 Nicotine dependence, cigarettes, uncomplicated: Secondary | ICD-10-CM | POA: Diagnosis present

## 2020-06-22 DIAGNOSIS — I951 Orthostatic hypotension: Secondary | ICD-10-CM | POA: Diagnosis not present

## 2020-06-22 DIAGNOSIS — R0602 Shortness of breath: Secondary | ICD-10-CM | POA: Diagnosis not present

## 2020-06-22 DIAGNOSIS — Z8616 Personal history of COVID-19: Secondary | ICD-10-CM

## 2020-06-22 DIAGNOSIS — R55 Syncope and collapse: Secondary | ICD-10-CM

## 2020-06-22 DIAGNOSIS — I10 Essential (primary) hypertension: Secondary | ICD-10-CM | POA: Diagnosis present

## 2020-06-22 DIAGNOSIS — Z79899 Other long term (current) drug therapy: Secondary | ICD-10-CM

## 2020-06-22 DIAGNOSIS — Z8249 Family history of ischemic heart disease and other diseases of the circulatory system: Secondary | ICD-10-CM

## 2020-06-22 DIAGNOSIS — K760 Fatty (change of) liver, not elsewhere classified: Secondary | ICD-10-CM | POA: Diagnosis present

## 2020-06-22 DIAGNOSIS — K219 Gastro-esophageal reflux disease without esophagitis: Secondary | ICD-10-CM | POA: Diagnosis present

## 2020-06-22 DIAGNOSIS — F32A Depression, unspecified: Secondary | ICD-10-CM

## 2020-06-22 DIAGNOSIS — F329 Major depressive disorder, single episode, unspecified: Secondary | ICD-10-CM | POA: Diagnosis present

## 2020-06-22 DIAGNOSIS — E876 Hypokalemia: Secondary | ICD-10-CM | POA: Diagnosis present

## 2020-06-22 DIAGNOSIS — Z20822 Contact with and (suspected) exposure to covid-19: Secondary | ICD-10-CM | POA: Diagnosis not present

## 2020-06-22 DIAGNOSIS — F419 Anxiety disorder, unspecified: Secondary | ICD-10-CM

## 2020-06-22 DIAGNOSIS — M810 Age-related osteoporosis without current pathological fracture: Secondary | ICD-10-CM | POA: Diagnosis present

## 2020-06-22 DIAGNOSIS — Z833 Family history of diabetes mellitus: Secondary | ICD-10-CM

## 2020-06-22 DIAGNOSIS — R21 Rash and other nonspecific skin eruption: Secondary | ICD-10-CM | POA: Diagnosis not present

## 2020-06-22 DIAGNOSIS — Z881 Allergy status to other antibiotic agents status: Secondary | ICD-10-CM

## 2020-06-22 DIAGNOSIS — Z83438 Family history of other disorder of lipoprotein metabolism and other lipidemia: Secondary | ICD-10-CM

## 2020-06-22 LAB — URINALYSIS, COMPLETE (UACMP) WITH MICROSCOPIC
Bacteria, UA: NONE SEEN
Bilirubin Urine: NEGATIVE
Glucose, UA: NEGATIVE mg/dL
Hgb urine dipstick: NEGATIVE
Ketones, ur: NEGATIVE mg/dL
Leukocytes,Ua: NEGATIVE
Nitrite: NEGATIVE
Protein, ur: NEGATIVE mg/dL
Specific Gravity, Urine: 1.005 (ref 1.005–1.030)
pH: 6 (ref 5.0–8.0)

## 2020-06-22 LAB — CBC WITH DIFFERENTIAL/PLATELET
Abs Immature Granulocytes: 0.03 10*3/uL (ref 0.00–0.07)
Basophils Absolute: 0.1 10*3/uL (ref 0.0–0.1)
Basophils Relative: 1 %
Eosinophils Absolute: 0.1 10*3/uL (ref 0.0–0.5)
Eosinophils Relative: 2 %
HCT: 46.3 % — ABNORMAL HIGH (ref 36.0–46.0)
Hemoglobin: 16.3 g/dL — ABNORMAL HIGH (ref 12.0–15.0)
Immature Granulocytes: 0 %
Lymphocytes Relative: 37 %
Lymphs Abs: 2.9 10*3/uL (ref 0.7–4.0)
MCH: 36.2 pg — ABNORMAL HIGH (ref 26.0–34.0)
MCHC: 35.2 g/dL (ref 30.0–36.0)
MCV: 102.9 fL — ABNORMAL HIGH (ref 80.0–100.0)
Monocytes Absolute: 0.5 10*3/uL (ref 0.1–1.0)
Monocytes Relative: 6 %
Neutro Abs: 4.3 10*3/uL (ref 1.7–7.7)
Neutrophils Relative %: 54 %
Platelets: 261 10*3/uL (ref 150–400)
RBC: 4.5 MIL/uL (ref 3.87–5.11)
RDW: 11.8 % (ref 11.5–15.5)
WBC: 7.9 10*3/uL (ref 4.0–10.5)
nRBC: 0 % (ref 0.0–0.2)

## 2020-06-22 LAB — COMPREHENSIVE METABOLIC PANEL
ALT: 34 U/L (ref 0–44)
AST: 63 U/L — ABNORMAL HIGH (ref 15–41)
Albumin: 3.4 g/dL — ABNORMAL LOW (ref 3.5–5.0)
Alkaline Phosphatase: 70 U/L (ref 38–126)
Anion gap: 15 (ref 5–15)
BUN: 9 mg/dL (ref 8–23)
CO2: 22 mmol/L (ref 22–32)
Calcium: 8.7 mg/dL — ABNORMAL LOW (ref 8.9–10.3)
Chloride: 105 mmol/L (ref 98–111)
Creatinine, Ser: 0.57 mg/dL (ref 0.44–1.00)
GFR calc Af Amer: >60 mL/min (ref >60)
GFR calc non Af Amer: >60 mL/min (ref >60)
Glucose, Bld: 102 mg/dL — ABNORMAL HIGH (ref 70–99)
Potassium: 3.9 mmol/L (ref 3.5–5.1)
Sodium: 142 mmol/L (ref 135–145)
Total Bilirubin: 1.2 mg/dL (ref 0.3–1.2)
Total Protein: 6.3 g/dL — ABNORMAL LOW (ref 6.5–8.1)

## 2020-06-22 LAB — RAPID URINE DRUG SCREEN, HOSP PERFORMED
Amphetamines: NOT DETECTED
Barbiturates: NOT DETECTED
Benzodiazepines: POSITIVE — AB
Cocaine: NOT DETECTED
Opiates: NOT DETECTED
Tetrahydrocannabinol: NOT DETECTED

## 2020-06-22 LAB — CK: Total CK: 117 U/L (ref 38–234)

## 2020-06-22 LAB — CBG MONITORING, ED: Glucose-Capillary: 109 mg/dL — ABNORMAL HIGH (ref 70–99)

## 2020-06-22 LAB — TROPONIN I (HIGH SENSITIVITY): Troponin I (High Sensitivity): 3 ng/L (ref <18)

## 2020-06-22 LAB — ETHANOL: Alcohol, Ethyl (B): 180 mg/dL — ABNORMAL HIGH (ref <10)

## 2020-06-22 MED ORDER — SODIUM CHLORIDE 0.9 % IV BOLUS
1000.0000 mL | Freq: Once | INTRAVENOUS | Status: AC
  Administered 2020-06-22: 1000 mL via INTRAVENOUS

## 2020-06-22 NOTE — ED Triage Notes (Signed)
Pt BIB EMS from home. Pt's son witnessed pt have LOC, and led her to floor. EMS arrival pt was on floor, unresponsive. EMS reports pt had at least 8 syncopal episodes in route and at scene. When pt sits up, she continues to have LOC. EMS reported negative for orthostatics.

## 2020-06-22 NOTE — ED Provider Notes (Signed)
[facility] EMERGENCY DEPARTMENT Provider Note   CSN: [id number] Arrival date & time: 06/22/20  2129     History Chief Complaint  Patient presents with  . Loss of Consciousness    Jody Wilson is a 69 y.o. female with pertinent past medical history of hyperlipidemia, hypertension, anxiety, anemia, GERD that presents the emergency department today for LOC via EMS.  EMS states that prior to arrival patient was on the floor and unresponsive, unsure how long patient was unresponsive for.  Sons witnessed LOC.  After speaking to son on the phone, he states that patient passed out and hit her head on the floor.  States that she was in and out for 20 minutes, kept rolling her eyes and back.  Was unable to respond for 20 minutes.  Was not complaining of any pain before this.  Son denies any seizure-like activity, neglect, facial droop, slurred speech.  When speaking to patient she states that she felt slightly sweaty before she passed out, was not having any chest pain, palpitations, weakness, paresthesias, shortness of breath, back pain, abdominal pain, nausea, vomiting.  States that she was in normal health before this.  Patient states that she was cooking a large meal for July 4th dinner.  Was not able to eat it and passed out before.  States that she did have 1 mixed drinks today.  Denies any other alcohol or IV drug use.  HPI     Past Medical History:  Diagnosis Date  . Anemia   . Anxiety   . Depression   . GERD (gastroesophageal reflux disease)   . Hyperlipidemia   . Hypertension   . Osteoporosis     Patient Active Problem List   Diagnosis Date Noted  . Tobacco abuse 04/25/2020  . Involuntary muscle jerks while sleeping 09/26/2019  . B12 deficiency 09/25/2019  . Iron deficiency anemia 09/25/2019  . Elevated liver function tests 09/07/2019  . Anemia 05/18/2018  . Osteoporosis 02/09/2017  . History of vertebral fracture 02/09/2017  . GERD (gastroesophageal  reflux disease) 03/11/2016  . Hyperlipidemia with target LDL less than 100 02/19/2015  . Adjustment disorder with mixed anxiety and depressed mood 03/18/2014  . Hypertension 03/18/2014    Past Surgical History:  Procedure Laterality Date  . ABDOMINAL HYSTERECTOMY    . BACK SURGERY    . CHOLECYSTECTOMY    . FOOT SURGERY     7 in the past  . IR GENERIC HISTORICAL  12/09/2016   IR RADIOLOGIST EVAL & MGMT 12/09/2016 [facility]  . IR GENERIC HISTORICAL  12/10/2016   IR VERTEBROPLASTY CERV/THOR BX INC UNI/BIL INC/INJECT/IMAGING 12/10/2016 [facility]  . TRACHEOSTOMY TUBE PLACEMENT  1982     OB History   No obstetric history on file.     Family History  Problem Relation Age of Onset  . Heart disease Mother   . Hypertension Mother   . Hyperlipidemia Mother   . Diabetes Mother   . Congestive Heart Failure Mother   . Hypertension Son   . Other Son 21       Kienbock's Disease  . Liver disease Son 90       Gilbert's Disease  . Aneurysm Paternal Uncle   . Heart attack Maternal Grandmother   . Heart attack Maternal Grandfather   . Lupus Half-Sister     Social History   Tobacco Use  . Smoking status: Current Every Day Smoker    Packs/day: 0.25    Types: Cigarettes  .  Smokeless tobacco: Never Used  . Tobacco comment: Trying to cut on number of cigarettes she smokes.  Vaping Use  . Vaping Use: Never used  Substance Use Topics  . Alcohol use: Yes    Comment: occassionally   . Drug use: No    Home Medications Prior to Admission medications   Medication Sig Start Date End Date Taking? Authorizing Provider  albuterol (PROVENTIL HFA;VENTOLIN HFA) 108 (90 Base) MCG/ACT inhaler Inhale 2 puffs into the lungs every 6 (six) hours as needed for wheezing or shortness of breath. Patient not taking: Reported on 04/09/2020 03/31/18   Dettinger, Fransisca Kaufmann, MD  ALPRAZolam Duanne Moron) 0.5 MG tablet Take 1 tablet (0.5 mg total) by mouth 2 (two) times daily as needed for anxiety. 04/14/20    Dettinger, Fransisca Kaufmann, MD  calcium-vitamin D (OSCAL WITH D) 500-200 MG-UNIT tablet Take 1 tablet by mouth 2 (two) times daily.     [provider]  citalopram (CELEXA) 40 MG tablet Take 1 tablet (40 mg total) by mouth daily. Patient taking differently: Take 40 mg by mouth daily. 1/2 tab per day 05/30/19   Dettinger, Fransisca Kaufmann, MD  clobetasol (TEMOVATE) 0.05 % external solution Apply 1 application topically 2 (two) times daily. 03/15/18   Dettinger, Fransisca Kaufmann, MD  denosumab (PROLIA) 60 MG/ML SOSY injection Inject 60 mg into the skin every 6 (six) months. 07/26/18   Dettinger, Fransisca Kaufmann, MD  hydrochlorothiazide (MICROZIDE) 12.5 MG capsule Take 1 capsule (12.5 mg total) by mouth daily. 04/04/20   Dettinger, Fransisca Kaufmann, MD  Multiple Vitamins-Minerals (WOMENS 50+ [place] VITAMIN/MIN PO) Take by mouth.    [provider]  mupirocin ointment (BACTROBAN) 2 % Place 1 application into the nose 2 (two) times daily. 11/24/18   Dettinger, Fransisca Kaufmann, MD  olmesartan (BENICAR) 40 MG tablet Take 1 tablet (40 mg total) by mouth daily. 11/09/19   Loman Brooklyn, FNP  pantoprazole (PROTONIX) 40 MG tablet Take 1 tablet (40 mg total) by mouth daily. 01/04/20   Dettinger, Fransisca Kaufmann, MD  tiZANidine (ZANAFLEX) 4 MG capsule Take 1 capsule (4 mg total) by mouth 3 (three) times daily. 07/26/18   Dettinger, Fransisca Kaufmann, MD  valACYclovir (VALTREX) 1000 MG tablet Take 2 tablets (2,000 mg total) by mouth 2 (two) times daily. For 2 days 12/24/19   Claretta Fraise, MD    Allergies    Tetracyclines & related  Review of Systems   Review of Systems  Constitutional: Negative for chills, diaphoresis, fatigue and fever.  HENT: Negative for congestion, sore throat and trouble swallowing.   Eyes: Negative for pain and visual disturbance.  Respiratory: Negative for cough, shortness of breath and wheezing.   Cardiovascular: Negative for chest pain, palpitations and leg swelling.  Gastrointestinal: Negative for abdominal distention,  abdominal pain, diarrhea, nausea and vomiting.  Genitourinary: Negative for decreased urine volume, difficulty urinating, flank pain, hematuria, pelvic pain and urgency.  Musculoskeletal: Negative for back pain, neck pain and neck stiffness.  Skin: Negative for pallor.  Neurological: Positive for syncope. Negative for dizziness, speech difficulty, weakness and headaches.  Psychiatric/Behavioral: Negative for confusion, self-injury, sleep disturbance and suicidal ideas. The patient is not nervous/anxious.     Physical Exam Updated Vital Signs BP (!) 178/94   Pulse 64   Temp 98 F (36.7 C) (Oral)   Resp 14   Ht 5\' 5"  (1.651 m)   Wt 68 kg   SpO2 98%   BMI 24.95 kg/m   Physical Exam Constitutional:  General: She is not in acute distress.    Appearance: Normal appearance. She is not ill-appearing, toxic-appearing or diaphoretic.  HENT:     Mouth/Throat:     Mouth: Mucous membranes are moist.     Pharynx: Oropharynx is clear.  Eyes:     General: No scleral icterus.    Extraocular Movements: Extraocular movements intact.     Pupils: Pupils are equal, round, and reactive to light.  Cardiovascular:     Rate and Rhythm: Normal rate and regular rhythm.     Pulses: Normal pulses.     Heart sounds: Normal heart sounds.  Pulmonary:     Effort: Pulmonary effort is normal. No respiratory distress.     Breath sounds: Normal breath sounds. No stridor. No wheezing, rhonchi or rales.  Chest:     Chest wall: No tenderness.  Abdominal:     General: Abdomen is flat. There is no distension.     Palpations: Abdomen is soft.     Tenderness: There is no abdominal tenderness. There is no guarding or rebound.  Musculoskeletal:        General: No swelling or tenderness. Normal range of motion.     Cervical back: Normal range of motion and neck supple. No rigidity.     Right lower leg: No edema.     Left lower leg: No edema.  Skin:    General: Skin is warm and dry.     Capillary Refill:  Capillary refill takes less than 2 seconds.     Coloration: Skin is not pale.  Neurological:     Mental Status: She is alert.     Comments: Alert and oriented times 3. Clear speech. No facial droop. CNIII-XII grossly intact. Bilateral upper and lower extremities' sensation grossly intact. 5/5 symmetric strength with grip strength and with plantar and dorsi flexion bilaterally.    Psychiatric:        Mood and Affect: Mood normal.        Behavior: Behavior normal.     ED Results / Procedures / Treatments   Labs (all labs ordered are listed, but only abnormal results are displayed) Labs Reviewed  COMPREHENSIVE METABOLIC PANEL - Abnormal; Notable for the following components:      Result Value   Glucose, Bld 102 (*)    Calcium 8.7 (*)    Total Protein 6.3 (*)    Albumin 3.4 (*)    AST 63 (*)    All other components within normal limits  CBC WITH DIFFERENTIAL/PLATELET - Abnormal; Notable for the following components:   Hemoglobin 16.3 (*)    HCT 46.3 (*)    MCV 102.9 (*)    MCH 36.2 (*)    All other components within normal limits  ETHANOL - Abnormal; Notable for the following components:   Alcohol, Ethyl (B) 180 (*)    All other components within normal limits  CBG MONITORING, ED - Abnormal; Notable for the following components:   Glucose-Capillary 109 (*)    All other components within normal limits  SARS CORONAVIRUS 2 BY RT PCR (HOSPITAL ORDER, [facility])  URINALYSIS, COMPLETE (UACMP) WITH MICROSCOPIC  CK  RAPID URINE DRUG SCREEN, HOSP PERFORMED  TROPONIN I (HIGH SENSITIVITY)    EKG EKG Interpretation  Date/Time:  Sunday June 22 2020 21:38:51 EDT Ventricular Rate:  64 PR Interval:    QRS Duration: 86 QT Interval:  441 QTC Calculation: 455 R Axis:   74 Text Interpretation: Sinus rhythm No  significant change since last tracing Confirmed by Wandra Arthurs [id number]) on 06/22/2020 10:15:24 PM   Radiology CT HEAD WO CONTRAST  Result Date:  06/22/2020 CLINICAL DATA:  Found unresponsive with subsequent syncopal episodes EXAM: CT HEAD WITHOUT CONTRAST TECHNIQUE: Contiguous axial images were obtained from the base of the skull through the vertex without intravenous contrast. COMPARISON:  None. FINDINGS: Brain: No evidence of acute infarction, hemorrhage, hydrocephalus, extra-axial collection or mass lesion/mass effect. Vascular: No hyperdense vessel or unexpected calcification. Skull: Normal. Negative for fracture or focal lesion. Sinuses/Orbits: Mucosal thickening is noted within the ethmoid sinus likely of a chronic nature. Other: None IMPRESSION: No acute intracranial abnormality. Mild mucosal thickening in the sphenoid sinus. Electronically Signed   By: Inez Catalina M.D.   On: 06/22/2020 22:28   DG Chest Port 1 View  Result Date: 06/22/2020 CLINICAL DATA:  69 year old female with syncope. EXAM: PORTABLE CHEST 1 VIEW COMPARISON:  Chest radiograph dated 07/29/2015. Chest CT dated 04/25/2020. FINDINGS: Mild chronic interstitial coarsening. No focal consolidation, pleural effusion, or pneumothorax. The cardiac silhouette is within limits. Atherosclerotic calcification of the aorta. No acute osseous pathology. Cervical ACDF. IMPRESSION: No active cardiopulmonary disease. Electronically Signed   By: Anner Crete M.D.   On: 06/22/2020 22:15    Procedures Procedures (including critical care time)  Medications Ordered in ED Medications  sodium chloride 0.9 % bolus 1,000 mL (1,000 mLs Intravenous New Bag/Given 06/22/20 2251)    ED Course  I have reviewed the triage vital signs and the nursing notes.  Pertinent labs & imaging results that were available during my care of the patient were reviewed by me and considered in my medical decision making (see chart for details).    MDM Rules/Calculators/A&P                         BAYLYNN SHIFFLETT is a 69 y.o. female with pertinent past medical history of hyperlipidemia, hypertension, anxiety,  anemia, GERD that presents the emergency department today for LOC via EMS.  Patient had 20 minutes of loss of consciousness in and out at home.  Via EMS patient had 8 episodes of loss of consciousness.  While here in the ER 1 witnessed episode of loss of consciousness.  Will obtain normal labs, CT head, chest x-ray.  Patient not complaining of any pain.  Negative orthostatics.  EKG without any signs of ischemia.  Work-up acutely negative.  Will consult hospitalist at this time for admission due to unknown causes of syncopal episodes. 43 Dr. Sheppard Coil, hospitalist to admit the patient.   The patient appears reasonably stabilized for admission considering the current resources, flow, and capabilities available in the ED at this time, and I doubt any other [facility] requiring further screening and/or treatment in the ED prior to admission.  I discussed this case with my attending physician who cosigned this note including patient's presenting symptoms, physical exam, and planned diagnostics and interventions. Attending physician stated agreement with plan or made changes to plan which were implemented.   Attending physician assessed patient at bedside.  Final Clinical Impression(s) / ED Diagnoses Final diagnoses:  Syncope, unspecified syncope type    Rx / DC Orders ED Discharge Orders    None       Alfredia Client, PA-C 06/22/20 2340    Drenda Freeze, MD 06/24/20 [phone]

## 2020-06-22 NOTE — H&P (Signed)
History and Physical    Jody Wilson VQQ:[id number] DOB: June 25, 1951 DOA: 06/22/2020  PCP: Dettinger, Fransisca Kaufmann, MD Consultants:  none Patient coming from:  Home  Chief Complaint: Syncope  HPI: Jody Wilson is a 69 y.o. female with medical history significant of  Past Medical History:  Diagnosis Date  . Anemia   . Anxiety   . Depression   . GERD (gastroesophageal reflux disease)   . Hyperlipidemia   . Hypertension   . Osteoporosis    Reports was cooking today and doesn't remember anything until she was in the ambulance. Reports months of unsteadiness more so than usual with occasional lightheadedness, no recent worsening. Reports low appetite d/t hiatal hernia / GERD and weight loss over past 6+ mos. Denies head pain. Husband was home at the time of event but didn't see anything. Pt has synopsized in the ED trying to stand for orthostatics. Feeling normal now other than fatigued. Stressed lately due to husband's illness and she is primary caretaker   ED Course: workup w/ CBC, CMP, CXR, CT Head unrevealing. UDS pending. UA no UTI. CK WNL. Trope WNL. No anemia, elevated MCV in setting of known Hx B12 deficiency. Hypocalcemia corrected for hypoalbuminemia is WNL. AST mild elevated in setting of recent EtOH use.   Review of Systems: As per HPI; otherwise review of systems reviewed and negative.   Ambulatory Status: Ambulates without assistance  Past Medical History:  Diagnosis Date  . Anemia   . Anxiety   . Depression   . GERD (gastroesophageal reflux disease)   . Hyperlipidemia   . Hypertension   . Osteoporosis     Past Surgical History:  Procedure Laterality Date  . ABDOMINAL HYSTERECTOMY    . BACK SURGERY    . CHOLECYSTECTOMY    . FOOT SURGERY     7 in the past  . IR GENERIC HISTORICAL  12/09/2016   IR RADIOLOGIST EVAL & MGMT 12/09/2016 [facility]  . IR GENERIC HISTORICAL  12/10/2016   IR VERTEBROPLASTY CERV/THOR BX INC UNI/BIL INC/INJECT/IMAGING 12/10/2016  [facility]  . TRACHEOSTOMY TUBE PLACEMENT  1982    Social History   Socioeconomic History  . Marital status: Married    Spouse name: Donnie  . Number of children: 3  . Years of education: 60  . Highest education level: Associate degree: occupational, Hotel manager, or vocational program  Occupational History  . 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  Tobacco Use  . Smoking status: Current Every Day Smoker    Packs/day: 0.25    Types: Cigarettes  . Smokeless tobacco: Never Used  . Tobacco comment: Trying to cut on number of cigarettes she smokes.  Vaping Use  . Vaping Use: Never used  Substance and Sexual Activity  . Alcohol use: Yes    Comment: occassionally   . Drug use: No  . Sexual activity: Not on file  Other Topics Concern  . Not on file  Social History Narrative   Lives with her husband, Jody Wilson, and has two grown sons and two grand sons.   She visits family and has a once weekly outing with a friend for socializing.  She enjoys making beaded jewelry, reading and walking..   Social Determinants of Health   Financial Resource Strain: Low Risk   . Difficulty of Paying Living Expenses: Not hard at all  Food Insecurity: No Food Insecurity  . Worried About Charity fundraiser  in the Last Year: Never true  . Ran Out of Food in the Last Year: Never true  Transportation Needs: No Transportation Needs  . Lack of Transportation (Medical): No  . Lack of Transportation (Non-Medical): No  Physical Activity: Sufficiently Active  . Days of Exercise per Week: 5 days  . Minutes of Exercise per Session: 60 min  Stress:   . Feeling of Stress :   Social Connections: Socially Integrated  . Frequency of Communication with Friends and Family: Twice a week  . Frequency of Social Gatherings with Friends and Family: Once a week  . Attends Religious Services: More than 4 times per year  . Active Member of Clubs or  Organizations: Yes  . Attends Archivist Meetings: More than 4 times per year  . Marital Status: Married  Human resources officer Violence: Not At Risk  . Fear of Current or Ex-Partner: No  . Emotionally Abused: No  . Physically Abused: No  . Sexually Abused: No    Allergies  Allergen Reactions  . Tetracyclines & Related Itching and Swelling    Family History  Problem Relation Age of Onset  . Heart disease Mother   . Hypertension Mother   . Hyperlipidemia Mother   . Diabetes Mother   . Congestive Heart Failure Mother   . Hypertension Son   . Other Son 40       Kienbock's Disease  . Liver disease Son 38       Gilbert's Disease  . Aneurysm Paternal Uncle   . Heart attack Maternal Grandmother   . Heart attack Maternal Grandfather   . Lupus Half-Sister     Prior to Admission medications   Medication Sig Start Date End Date Taking? Authorizing Provider  albuterol (PROVENTIL HFA;VENTOLIN HFA) 108 (90 Base) MCG/ACT inhaler Inhale 2 puffs into the lungs every 6 (six) hours as needed for wheezing or shortness of breath. Patient not taking: Reported on 04/09/2020 03/31/18   Dettinger, Fransisca Kaufmann, MD  ALPRAZolam Duanne Moron) 0.5 MG tablet Take 1 tablet (0.5 mg total) by mouth 2 (two) times daily as needed for anxiety. 04/14/20   Dettinger, Fransisca Kaufmann, MD  calcium-vitamin D (OSCAL WITH D) 500-200 MG-UNIT tablet Take 1 tablet by mouth 2 (two) times daily.     [provider]  citalopram (CELEXA) 40 MG tablet Take 1 tablet (40 mg total) by mouth daily. Patient taking differently: Take 40 mg by mouth daily. 1/2 tab per day 05/30/19   Dettinger, Fransisca Kaufmann, MD  clobetasol (TEMOVATE) 0.05 % external solution Apply 1 application topically 2 (two) times daily. 03/15/18   Dettinger, Fransisca Kaufmann, MD  denosumab (PROLIA) 60 MG/ML SOSY injection Inject 60 mg into the skin every 6 (six) months. 07/26/18   Dettinger, Fransisca Kaufmann, MD  hydrochlorothiazide (MICROZIDE) 12.5 MG capsule Take 1 capsule (12.5 mg total)  by mouth daily. 04/04/20   Dettinger, Fransisca Kaufmann, MD  Multiple Vitamins-Minerals (WOMENS 50+ [place] VITAMIN/MIN PO) Take by mouth.    [provider]  mupirocin ointment (BACTROBAN) 2 % Place 1 application into the nose 2 (two) times daily. 11/24/18   Dettinger, Fransisca Kaufmann, MD  olmesartan (BENICAR) 40 MG tablet Take 1 tablet (40 mg total) by mouth daily. 11/09/19   Loman Brooklyn, FNP  pantoprazole (PROTONIX) 40 MG tablet Take 1 tablet (40 mg total) by mouth daily. 01/04/20   Dettinger, Fransisca Kaufmann, MD  tiZANidine (ZANAFLEX) 4 MG capsule Take 1 capsule (4 mg total) by mouth 3 (three) times daily.  07/26/18   Dettinger, Fransisca Kaufmann, MD  valACYclovir (VALTREX) 1000 MG tablet Take 2 tablets (2,000 mg total) by mouth 2 (two) times daily. For 2 days 12/24/19   Claretta Fraise, MD    Physical Exam: Vitals:   06/22/20 2137 06/22/20 2138 06/22/20 2302  BP:  (!) 148/75 (!) 178/94  Pulse:  62 64  Resp:  11 14  Temp:  98 F (36.7 C)   TempSrc:  Oral   SpO2:  97% 98%  Weight: 68 kg    Height: 5\' 5"  (1.651 m)       . General:  Appears calm and comfortable and is NAD . Eyes:  PERRL, EOMI, normal lids, iris . ENT:  grossly normal hearing, lips & tongue, mmm; appropriate dentition . Neck:  no LAD, masses or thyromegaly; no carotid bruits . Cardiovascular:  RRR, no m/r/g. No LE edema.  Marland Kitchen Respiratory:   CTA bilaterally with no wheezes/rales/rhonchi.  Normal respiratory effort. . Abdomen:  soft, NT, ND, NABS . Back:   normal alignment, no CVAT . Skin:  no rash or induration seen on limited exam . Musculoskeletal:  grossly normal tone BUE/BLE, good ROM, no bony abnormality . Lower extremity:  No LE edema.  Limited foot exam with no ulcerations.  2+ distal pulses. Marland Kitchen Psychiatric:  grossly normal mood and affect, speech fluent and appropriate, AOx3 . Neurologic:  CN 2-12 grossly intact, moves all extremities in coordinated fashion, sensation intact    Radiological Exams on Admission: CT HEAD WO  CONTRAST  Result Date: 06/22/2020 CLINICAL DATA:  Found unresponsive with subsequent syncopal episodes EXAM: CT HEAD WITHOUT CONTRAST TECHNIQUE: Contiguous axial images were obtained from the base of the skull through the vertex without intravenous contrast. COMPARISON:  None. FINDINGS: Brain: No evidence of acute infarction, hemorrhage, hydrocephalus, extra-axial collection or mass lesion/mass effect. Vascular: No hyperdense vessel or unexpected calcification. Skull: Normal. Negative for fracture or focal lesion. Sinuses/Orbits: Mucosal thickening is noted within the ethmoid sinus likely of a chronic nature. Other: None IMPRESSION: No acute intracranial abnormality. Mild mucosal thickening in the sphenoid sinus. Electronically Signed   By: Inez Catalina M.D.   On: 06/22/2020 22:28   DG Chest Port 1 View  Result Date: 06/22/2020 CLINICAL DATA:  69 year old female with syncope. EXAM: PORTABLE CHEST 1 VIEW COMPARISON:  Chest radiograph dated 07/29/2015. Chest CT dated 04/25/2020. FINDINGS: Mild chronic interstitial coarsening. No focal consolidation, pleural effusion, or pneumothorax. The cardiac silhouette is within limits. Atherosclerotic calcification of the aorta. No acute osseous pathology. Cervical ACDF. IMPRESSION: No active cardiopulmonary disease. Electronically Signed   By: Anner Crete M.D.   On: 06/22/2020 22:15    EKG: Independently reviewed.  NSR with rate 64;    Labs on Admission: I have personally reviewed the available labs and imaging studies at the time of the admission.  Pertinent labs:      Assessment/Plan Principal Problem:   Syncope Active Problems:   Hypertension   GERD (gastroesophageal reflux disease)   Anxiety and depression   Hiatal hernia   Thoracic aortic atherosclerosis (HCC)    NEUROLOGICAL:  Syncope CT WNL, will get MRI in case stroke. TIA/seizure possible. If MRI, Echo and Carotid US WNL would consider neuro consult. Pt passed out when standing up for  orthostatics. PT consult placed for balance/gait training especially given osteoporisi fracture risk  Hx anxiety/depression Hx periodic movement d/o previously following w/ neuology but hasn't been back there, they adjusted her psychiatric meds Continue home Citalopram  Hold benzodiazepine  Hold tizanidine    CARDIOVASCULAR:  HTN On imaging, concern for small atherosclerotic ulcer in thoracic aorta pt reports referral should have been made to vascular specialist, would follow this on discharge, consider inpatient consult if all other w/u non-revealing  Orthostatics as below, pt passed out while trying ot stand but BP at that moment was not measured, BP above goal on presentation and improved on later checks Will continue Olmesartan Low dose HCTZ at home, unlikely to be causing symptoms, will continue  Orthostatic VS for the past 24 hrs (Last 3 readings):  BP- Lying Pulse- Lying BP- Sitting Pulse- Sitting  06/22/20 2255 146/81 66 (!) 154/96 70   BP Readings from Last 3 Encounters:  06/22/20 (!) 178/94  04/09/20 (!) 141/85  04/04/20 117/68    GASTROINTESTINAL:  GERD Hiatal Hernia (see MRI and CT results) Continue home PPI Pt GI consult outpatient re: hiatal hernia, planning for EGD/colonoscopy later this month  Pt reports weight loss d/t not eating as much with GERD, albumin low, malnourished   MUSCULOSKELETAL/RHEUM Osteoporosis On Prolia Pseudo-hypocalcemia, corrects to WNL w/ low albumin, ignore   INFECTIOUS DISEASE Hx HSV Valacyclovir prn     Note: This patient has been tested and is negative for the novel coronavirus COVID-19.  DVT prophylaxis:  Lovenox Code Status Full - confirmed with patient Family Communication: none  Disposition Plan:  Home once clinically improved Consults called: none Admission status: inpatient    [place] [facility]   How to contact the [facility] Attending or Consulting provider [place] or covering provider during  after hours [place], for this patient?  1. Check the care team in [facility] and look for a) attending/consulting [facility] provider listed and b) the [facility] team listed 2. Log into [email or web address] and use [place]'s universal password to access. If you do not have the password, please contact the hospital operator. 3. Locate the [facility] provider you are looking for under [facility] and page to a number that you can be directly reached. 4. If you still have difficulty reaching the provider, please page the [facility] (Director on Call) for the Hospitalists listed on amion for assistance.   06/22/2020, 11:32 PM

## 2020-06-22 NOTE — ED Notes (Signed)
Pt lost consciousness while trying to stand during orthostatics.

## 2020-06-23 ENCOUNTER — Inpatient Hospital Stay (HOSPITAL_COMMUNITY): Payer: Medicare Other

## 2020-06-23 ENCOUNTER — Encounter (HOSPITAL_COMMUNITY): Payer: Self-pay | Admitting: Osteopathic Medicine

## 2020-06-23 DIAGNOSIS — I361 Nonrheumatic tricuspid (valve) insufficiency: Secondary | ICD-10-CM

## 2020-06-23 DIAGNOSIS — E785 Hyperlipidemia, unspecified: Secondary | ICD-10-CM | POA: Diagnosis present

## 2020-06-23 DIAGNOSIS — R55 Syncope and collapse: Secondary | ICD-10-CM | POA: Diagnosis not present

## 2020-06-23 DIAGNOSIS — F32A Depression, unspecified: Secondary | ICD-10-CM

## 2020-06-23 DIAGNOSIS — F329 Major depressive disorder, single episode, unspecified: Secondary | ICD-10-CM

## 2020-06-23 DIAGNOSIS — K449 Diaphragmatic hernia without obstruction or gangrene: Secondary | ICD-10-CM | POA: Diagnosis not present

## 2020-06-23 DIAGNOSIS — I700 Atherosclerosis of aorta: Secondary | ICD-10-CM | POA: Diagnosis not present

## 2020-06-23 DIAGNOSIS — K760 Fatty (change of) liver, not elsewhere classified: Secondary | ICD-10-CM | POA: Diagnosis present

## 2020-06-23 DIAGNOSIS — J3489 Other specified disorders of nose and nasal sinuses: Secondary | ICD-10-CM | POA: Diagnosis not present

## 2020-06-23 DIAGNOSIS — M810 Age-related osteoporosis without current pathological fracture: Secondary | ICD-10-CM | POA: Diagnosis present

## 2020-06-23 DIAGNOSIS — Z79899 Other long term (current) drug therapy: Secondary | ICD-10-CM | POA: Diagnosis not present

## 2020-06-23 DIAGNOSIS — Z8249 Family history of ischemic heart disease and other diseases of the circulatory system: Secondary | ICD-10-CM | POA: Diagnosis not present

## 2020-06-23 DIAGNOSIS — I10 Essential (primary) hypertension: Secondary | ICD-10-CM

## 2020-06-23 DIAGNOSIS — E876 Hypokalemia: Secondary | ICD-10-CM | POA: Diagnosis present

## 2020-06-23 DIAGNOSIS — K219 Gastro-esophageal reflux disease without esophagitis: Secondary | ICD-10-CM

## 2020-06-23 DIAGNOSIS — I6782 Cerebral ischemia: Secondary | ICD-10-CM | POA: Diagnosis not present

## 2020-06-23 DIAGNOSIS — F17210 Nicotine dependence, cigarettes, uncomplicated: Secondary | ICD-10-CM | POA: Diagnosis present

## 2020-06-23 DIAGNOSIS — I951 Orthostatic hypotension: Secondary | ICD-10-CM | POA: Diagnosis present

## 2020-06-23 DIAGNOSIS — Z83438 Family history of other disorder of lipoprotein metabolism and other lipidemia: Secondary | ICD-10-CM | POA: Diagnosis not present

## 2020-06-23 DIAGNOSIS — I340 Nonrheumatic mitral (valve) insufficiency: Secondary | ICD-10-CM | POA: Diagnosis not present

## 2020-06-23 DIAGNOSIS — F419 Anxiety disorder, unspecified: Secondary | ICD-10-CM

## 2020-06-23 DIAGNOSIS — R9082 White matter disease, unspecified: Secondary | ICD-10-CM | POA: Diagnosis not present

## 2020-06-23 DIAGNOSIS — Z8616 Personal history of COVID-19: Secondary | ICD-10-CM | POA: Diagnosis not present

## 2020-06-23 DIAGNOSIS — Z833 Family history of diabetes mellitus: Secondary | ICD-10-CM | POA: Diagnosis not present

## 2020-06-23 DIAGNOSIS — Z881 Allergy status to other antibiotic agents status: Secondary | ICD-10-CM | POA: Diagnosis not present

## 2020-06-23 LAB — ECHOCARDIOGRAM COMPLETE
Height: 65 in
Weight: 2305.6 oz

## 2020-06-23 LAB — COMPREHENSIVE METABOLIC PANEL
ALT: 37 U/L (ref 0–44)
AST: 51 U/L — ABNORMAL HIGH (ref 15–41)
Albumin: 3.4 g/dL — ABNORMAL LOW (ref 3.5–5.0)
Alkaline Phosphatase: 70 U/L (ref 38–126)
Anion gap: 13 (ref 5–15)
BUN: 8 mg/dL (ref 8–23)
CO2: 23 mmol/L (ref 22–32)
Calcium: 8.3 mg/dL — ABNORMAL LOW (ref 8.9–10.3)
Chloride: 106 mmol/L (ref 98–111)
Creatinine, Ser: 0.59 mg/dL (ref 0.44–1.00)
GFR calc Af Amer: >60 mL/min (ref >60)
GFR calc non Af Amer: >60 mL/min (ref >60)
Glucose, Bld: 91 mg/dL (ref 70–99)
Potassium: 3.3 mmol/L — ABNORMAL LOW (ref 3.5–5.1)
Sodium: 142 mmol/L (ref 135–145)
Total Bilirubin: 0.9 mg/dL (ref 0.3–1.2)
Total Protein: 6.2 g/dL — ABNORMAL LOW (ref 6.5–8.1)

## 2020-06-23 LAB — SARS CORONAVIRUS 2 BY RT PCR (HOSPITAL ORDER, PERFORMED IN ~~LOC~~ HOSPITAL LAB): SARS Coronavirus 2: NEGATIVE

## 2020-06-23 LAB — CBC
HCT: 46.9 % — ABNORMAL HIGH (ref 36.0–46.0)
Hemoglobin: 16.3 g/dL — ABNORMAL HIGH (ref 12.0–15.0)
MCH: 36.5 pg — ABNORMAL HIGH (ref 26.0–34.0)
MCHC: 34.8 g/dL (ref 30.0–36.0)
MCV: 104.9 fL — ABNORMAL HIGH (ref 80.0–100.0)
Platelets: 263 10*3/uL (ref 150–400)
RBC: 4.47 MIL/uL (ref 3.87–5.11)
RDW: 11.8 % (ref 11.5–15.5)
WBC: 6.4 10*3/uL (ref 4.0–10.5)
nRBC: 0 % (ref 0.0–0.2)

## 2020-06-23 LAB — HIV ANTIBODY (ROUTINE TESTING W REFLEX): HIV Screen 4th Generation wRfx: NONREACTIVE

## 2020-06-23 LAB — TROPONIN I (HIGH SENSITIVITY): Troponin I (High Sensitivity): 4 ng/L (ref <18)

## 2020-06-23 LAB — TSH: TSH: 1.61 u[IU]/mL (ref 0.350–4.500)

## 2020-06-23 MED ORDER — SODIUM CHLORIDE 0.9 % IV SOLN: INTRAVENOUS | Status: DC

## 2020-06-23 MED ORDER — CITALOPRAM HYDROBROMIDE 20 MG PO TABS
20.0000 mg | ORAL_TABLET | Freq: Every day | ORAL | Status: DC
  Administered 2020-06-23 – 2020-06-24 (×2): 20 mg via ORAL
  Filled 2020-06-23 (×2): qty 1

## 2020-06-23 MED ORDER — ACETAMINOPHEN 325 MG PO TABS
650.0000 mg | ORAL_TABLET | Freq: Four times a day (QID) | ORAL | Status: DC | PRN
  Administered 2020-06-23: 650 mg via ORAL
  Filled 2020-06-23: qty 2

## 2020-06-23 MED ORDER — DIPHENHYDRAMINE HCL 25 MG PO CAPS
25.0000 mg | ORAL_CAPSULE | Freq: Once | ORAL | Status: AC | PRN
  Administered 2020-06-23: 25 mg via ORAL
  Filled 2020-06-23: qty 1

## 2020-06-23 MED ORDER — SODIUM CHLORIDE 0.9% FLUSH
3.0000 mL | Freq: Two times a day (BID) | INTRAVENOUS | Status: DC
  Administered 2020-06-23: 3 mL via INTRAVENOUS

## 2020-06-23 MED ORDER — SODIUM CHLORIDE 0.9 % IV SOLN: 250.0000 mL | INTRAVENOUS | Status: DC | PRN

## 2020-06-23 MED ORDER — PANTOPRAZOLE SODIUM 40 MG PO TBEC
40.0000 mg | DELAYED_RELEASE_TABLET | Freq: Every day | ORAL | Status: DC
  Administered 2020-06-23 – 2020-06-24 (×2): 40 mg via ORAL
  Filled 2020-06-23 (×2): qty 1

## 2020-06-23 MED ORDER — LORAZEPAM 0.5 MG PO TABS
0.5000 mg | ORAL_TABLET | Freq: Three times a day (TID) | ORAL | Status: DC | PRN
  Administered 2020-06-23 – 2020-06-24 (×2): 0.5 mg via ORAL
  Filled 2020-06-23 (×2): qty 1

## 2020-06-23 MED ORDER — IRBESARTAN 150 MG PO TABS
300.0000 mg | ORAL_TABLET | Freq: Every day | ORAL | Status: DC
  Administered 2020-06-23 – 2020-06-24 (×2): 300 mg via ORAL
  Filled 2020-06-23 (×2): qty 2

## 2020-06-23 MED ORDER — SODIUM CHLORIDE 0.9% FLUSH: 3.0000 mL | INTRAVENOUS | Status: DC | PRN

## 2020-06-23 MED ORDER — POTASSIUM CHLORIDE CRYS ER 20 MEQ PO TBCR
40.0000 meq | EXTENDED_RELEASE_TABLET | Freq: Once | ORAL | Status: AC
  Administered 2020-06-23: 40 meq via ORAL
  Filled 2020-06-23: qty 2

## 2020-06-23 MED ORDER — ENSURE ENLIVE PO LIQD
237.0000 mL | Freq: Two times a day (BID) | ORAL | Status: DC
  Administered 2020-06-23: 237 mL via ORAL

## 2020-06-23 MED ORDER — HYDROCHLOROTHIAZIDE 12.5 MG PO CAPS
12.5000 mg | ORAL_CAPSULE | Freq: Every day | ORAL | Status: DC
  Administered 2020-06-23: 12.5 mg via ORAL
  Filled 2020-06-23: qty 1

## 2020-06-23 NOTE — Progress Notes (Signed)
  Echocardiogram 2D Echocardiogram has been performed.  Jody Wilson 06/23/2020, 11:21 AM

## 2020-06-23 NOTE — Progress Notes (Signed)
PROGRESS NOTE    Jody Wilson  FXT:[id number] DOB: 15-Feb-1951 DOA: 06/22/2020 PCP: Dettinger, Fransisca Kaufmann, MD    Chief Complaint  Patient presents with  . Loss of Consciousness    Brief Narrative: 69 year old lady with prior history of depression, hypertension, hyperlipidemia, GERD recently diagnosed liver steatosis, hiatal hernia presents to ED for syncope.  Assessment & Plan:   Principal Problem:   Syncope Active Problems:   Hypertension   GERD (gastroesophageal reflux disease)   Anxiety and depression   Hiatal hernia   Thoracic aortic atherosclerosis (HCC)   Syncope Probably secondary to orthostatic hypotension CT of the head does not show any acute intracranial abnormalities. MRI of the brain does not show any acute stroke showed mild white matter cerebral disease secondary to chronic ischemic disease. Carotid duplex does not show stenosis to explain her symptoms Echocardiogram showed LVEF of 60 to 65%. No regional wall motion abnormalities. Left ventricular diastolic parameters are indeterminate.     Small focal outpouching approximately 7 x 7 mm along the distal thoracic aorta may represent a penetrating atherosclerotic ulcer not associated with acute features and unchanged since previous chest CT.  Consider CT angiogram follow-up in 1 year with vascular surgery consultation to guide further follow-up.    Large hiatal hernia extending into the chest  On the CT chest: / GERD Recommend outpatient follow up with GI for EGD.  Continue with PPI.    Essential hypertension;  BP parameters are slightly on the high normal.  Since she was orthostatic in the ED, gently hydrate and repeat orthostatic vital signs today.    Anxiety and depression: Resume home meds.     Compression fracture at T11  PAIN control and PT evaluation.    Hypokalemia:  Replaced.    DVT prophylaxis: Lovenox Code Status: Full code Family Communication: (None at bedside Disposition:    Status is: Inpatient  Remains inpatient appropriate because:IV treatments appropriate due to intensity of illness or inability to take PO   Dispo: The patient is from: Home              Anticipated d/c is to: Home              Anticipated d/c date is: 1 day              Patient currently is not medically stable to d/c.       Consultants:   None  Procedures: Echocardiogram Carotid duplex  Antimicrobials: None   Subjective: Reports dizziness has improved does not feel lightheaded today  Objective: Vitals:   06/23/20 0204 06/23/20 0647 06/23/20 0758 06/23/20 1130  BP: (!) 176/96 (!) 150/77 (!) 149/93 (!) 146/85  Pulse: 60 65 65 60  Resp: 18 19 20 20   Temp: 98.3 F (36.8 C) 98.4 F (36.9 C) 98.3 F (36.8 C) 98.5 F (36.9 C)  TempSrc: Oral Oral Oral Oral  SpO2: 96% 93% 95% 94%  Weight:  65.4 kg    Height:        Intake/Output Summary (Last 24 hours) at 06/23/2020 1644 Last data filed at 06/23/2020 0300 Gross per 24 hour  Intake 1000 ml  Output --  Net 1000 ml   Filed Weights   06/22/20 2137 06/23/20 0647  Weight: 68 kg 65.4 kg    Examination:  General exam: Appears calm and comfortable  Respiratory system: Clear to auscultation. Respiratory effort normal. Cardiovascular system: S1 & S2 heard, RRR. No JVD,  No pedal edema. Gastrointestinal system: Abdomen is  nondistended, soft and nontender.  Normal bowel sounds heard. Central nervous system: Alert and oriented. No focal neurological deficits. Extremities: Symmetric 5 x 5 power. Skin: No rashes, lesions or ulcers Psychiatry:. Mood & affect appropriate.     Data Reviewed: I have personally reviewed following labs and imaging studies  CBC: Recent Labs  Lab 06/22/20 2207 06/23/20 0232  WBC 7.9 6.4  NEUTROABS 4.3  --   HGB 16.3* 16.3*  HCT 46.3* 46.9*  MCV 102.9* 104.9*  PLT 261 846    Basic Metabolic Panel: Recent Labs  Lab 06/22/20 2207 06/23/20 0232  NA 142 142  K 3.9 3.3*  CL 105  106  CO2 22 23  GLUCOSE 102* 91  BUN 9 8  CREATININE 0.57 0.59  CALCIUM 8.7* 8.3*    GFR: Estimated Creatinine Clearance: 60.6 mL/min (by C-G formula based on SCr of 0.59 mg/dL).  Liver Function Tests: Recent Labs  Lab 06/22/20 2207 06/23/20 0232  AST 63* 51*  ALT 34 37  ALKPHOS 70 70  BILITOT 1.2 0.9  PROT 6.3* 6.2*  ALBUMIN 3.4* 3.4*    CBG: Recent Labs  Lab 06/22/20 2217  GLUCAP 109*     Recent Results (from the past 240 hour(s))  SARS Coronavirus 2 by RT PCR (hospital order, performed in [facility] hospital lab) Nasopharyngeal Nasopharyngeal Swab     Status: None   Collection Time: 06/23/20 12:06 AM   Specimen: Nasopharyngeal Swab  Result Value Ref Range Status   SARS Coronavirus 2 NEGATIVE NEGATIVE Final    Comment: (NOTE) SARS-CoV-2 target nucleic acids are NOT DETECTED.  The SARS-CoV-2 RNA is generally detectable in upper and lower respiratory specimens during the acute phase of infection. The lowest concentration of SARS-CoV-2 viral copies this assay can detect is 250 copies / mL. A negative result does not preclude SARS-CoV-2 infection and should not be used as the sole basis for treatment or other patient management decisions.  A negative result may occur with improper specimen collection / handling, submission of specimen other than nasopharyngeal swab, presence of viral mutation(s) within the areas targeted by this assay, and inadequate number of viral copies (<250 copies / mL). A negative result must be combined with clinical observations, patient history, and epidemiological information.  Fact Sheet for Patients:   StrictlyIdeas.no  Fact Sheet for Healthcare Providers: BankingDealers.co.za  This test is not yet approved or  cleared by the [place] FDA and has been authorized for detection and/or diagnosis of SARS-CoV-2 by FDA under an Emergency Use Authorization (EUA).  This EUA will  remain in effect (meaning this test can be used) for the duration of the COVID-19 declaration under Section 564(b)(1) of the Act, 21 U.S.C. section 360bbb-3(b)(1), unless the authorization is terminated or revoked sooner.  Performed at [facility], [place] [address] [place], [place] [address]          Radiology Studies: CT HEAD WO CONTRAST  Result Date: 06/22/2020 CLINICAL DATA:  Found unresponsive with subsequent syncopal episodes EXAM: CT HEAD WITHOUT CONTRAST TECHNIQUE: Contiguous axial images were obtained from the base of the skull through the vertex without intravenous contrast. COMPARISON:  None. FINDINGS: Brain: No evidence of acute infarction, hemorrhage, hydrocephalus, extra-axial collection or mass lesion/mass effect. Vascular: No hyperdense vessel or unexpected calcification. Skull: Normal. Negative for fracture or focal lesion. Sinuses/Orbits: Mucosal thickening is noted within the ethmoid sinus likely of a chronic nature. Other: None IMPRESSION: No acute intracranial abnormality. Mild mucosal thickening in the sphenoid sinus.  Electronically Signed   By: Inez Catalina M.D.   On: 06/22/2020 22:28   MR BRAIN WO CONTRAST  Result Date: 06/23/2020 CLINICAL DATA:  Initial evaluation for acute syncope. EXAM: MRI HEAD WITHOUT CONTRAST TECHNIQUE: Multiplanar, multiecho pulse sequences of the brain and surrounding structures were obtained without intravenous contrast. COMPARISON:  Prior head CT from 06/22/2020. FINDINGS: Brain: Cerebral volume within normal limits for age. Scattered subcentimeter foci of T2/FLAIR hyperintensity noted within the periventricular, deep, and subcortical white matter both cerebral hemispheres, nonspecific, but most commonly related to chronic microvascular ischemic disease. Overall, appearance is mild for age. No evidence for acute or subacute infarct. Gray-white matter differentiation maintained. No encephalomalacia to suggest chronic cortical infarction. No  evidence for acute or chronic intracranial hemorrhage. No mass lesion, midline shift or mass effect. No hydrocephalus or extra-axial fluid collection. Pituitary gland suprasellar region normal. Midline structures intact. Vascular: Major intracranial vascular flow voids are maintained. Skull and upper cervical spine: Craniocervical junction within normal limits. Upper cervical spine normal. Bone marrow signal intensity diffusely heterogeneous without focal marrow replacing lesion. No scalp soft tissue abnormality. Sinuses/Orbits: Globes and orbital soft tissues within normal limits. Scattered mucosal thickening noted throughout the paranasal sinuses. No air-fluid levels to suggest acute sinusitis. No significant mastoid effusion. Inner ear structures grossly normal. Other: None. IMPRESSION: 1. No acute intracranial abnormality. 2. 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 Electronically Signed   By: Jeannine Boga M.D.   On: 06/23/2020 02:21   DG Chest Port 1 View  Result Date: 06/22/2020 CLINICAL DATA:  69 year old female with syncope. EXAM: PORTABLE CHEST 1 VIEW COMPARISON:  Chest radiograph dated 07/29/2015. Chest CT dated 04/25/2020. FINDINGS: Mild chronic interstitial coarsening. No focal consolidation, pleural effusion, or pneumothorax. The cardiac silhouette is within limits. Atherosclerotic calcification of the aorta. No acute osseous pathology. Cervical ACDF. IMPRESSION: No active cardiopulmonary disease. Electronically Signed   By: Anner Crete M.D.   On: 06/22/2020 22:15   ECHOCARDIOGRAM COMPLETE  Result Date: 06/23/2020    ECHOCARDIOGRAM REPORT   Patient Name:   Jody Wilson Date of Exam: 06/23/2020 Medical Rec #:  [id number]       Height:       65.0 in Accession #:    [id number]      Weight:       144.1 lb Date of Birth:  07/12/1951      BSA:          1.721 m Patient Age:    89 years        BP:           149/93  mmHg Patient Gender: F               HR:           65 bpm. Exam Location:  Inpatient Procedure: 2D Echo, Color Doppler and Cardiac Doppler Indications:    Syncope  History:        Patient has no prior history of Echocardiogram examinations.                 Risk Factors:Hypertension, Dyslipidemia and Current Smoker.  Sonographer:    Clayton Lefort RDCS (AE) Referring Phys: [id number] [place]  1. Left ventricular ejection fraction, by estimation, is 60 to 65%. The left ventricle has normal function. The left ventricle has no regional wall motion abnormalities. Left ventricular diastolic parameters are indeterminate.  2. Right ventricular systolic function is normal.  The right ventricular size is normal. There is normal pulmonary artery systolic pressure. The estimated right ventricular systolic pressure is 42.7 mmHg.  3. The mitral valve is grossly normal. Mild mitral valve regurgitation.  4. The aortic valve is tricuspid. Aortic valve regurgitation is not visualized. Mild to moderate aortic valve sclerosis/calcification is present, without any evidence of aortic stenosis. Aortic valve mean gradient measures 4.0 mmHg.  5. The inferior vena cava is normal in size with greater than 50% respiratory variability, suggesting right atrial pressure of 3 mmHg. FINDINGS  Left Ventricle: Left ventricular ejection fraction, by estimation, is 60 to 65%. The left ventricle has normal function. The left ventricle has no regional wall motion abnormalities. The left ventricular internal cavity size was normal in size. There is  borderline left ventricular hypertrophy. Left ventricular diastolic parameters are indeterminate. Right Ventricle: The right ventricular size is normal. No increase in right ventricular wall thickness. Right ventricular systolic function is normal. There is normal pulmonary artery systolic pressure. The tricuspid regurgitant velocity is 2.51 m/s, and  with an assumed right atrial pressure of 3  mmHg, the estimated right ventricular systolic pressure is 06.2 mmHg. Left Atrium: Left atrial size was normal in size. Right Atrium: Right atrial size was normal in size. Pericardium: There is no evidence of pericardial effusion. Presence of pericardial fat pad. Mitral Valve: The mitral valve is grossly normal. Mild mitral annular calcification. Mild mitral valve regurgitation. MV peak gradient, 4.4 mmHg. The mean mitral valve gradient is 2.0 mmHg. Tricuspid Valve: The tricuspid valve is grossly normal. Tricuspid valve regurgitation is mild. Aortic Valve: The aortic valve is tricuspid. Aortic valve regurgitation is not visualized. Mild to moderate aortic valve sclerosis/calcification is present, without any evidence of aortic stenosis. Mild to moderate aortic valve annular calcification. Aortic valve mean gradient measures 4.0 mmHg. Aortic valve peak gradient measures 8.2 mmHg. Aortic valve area, by VTI measures 1.95 cm. Pulmonic Valve: The pulmonic valve was not well visualized. Pulmonic valve regurgitation is trivial. Aorta: The aortic root is normal in size and structure. Venous: The inferior vena cava is normal in size with greater than 50% respiratory variability, suggesting right atrial pressure of 3 mmHg. IAS/Shunts: No atrial level shunt detected by color flow Doppler.  LEFT VENTRICLE PLAX 2D LVIDd:         4.10 cm  Diastology LVIDs:         2.60 cm  LV e' lateral:   6.85 cm/s LV PW:         0.90 cm  LV E/e' lateral: 13.5 LV IVS:        1.00 cm  LV e' medial:    7.51 cm/s LVOT diam:     1.90 cm  LV E/e' medial:  12.3 LV SV:         63 LV SV Index:   36 LVOT Area:     2.84 cm  RIGHT VENTRICLE             IVC RV Basal diam:  2.70 cm     IVC diam: 1.80 cm RV S prime:     12.30 cm/s TAPSE (M-mode): 2.5 cm LEFT ATRIUM             Index       RIGHT ATRIUM           Index LA diam:        2.30 cm 1.34 cm/m  RA Area:     15.10 cm LA Vol (A2C):  42.0 ml 24.41 ml/m RA Volume:   37.50 ml  21.79 ml/m LA Vol  (A4C):   36.5 ml 21.21 ml/m LA Biplane Vol: 41.2 ml 23.94 ml/m  AORTIC VALVE AV Area (Vmax):    1.90 cm AV Area (Vmean):   2.11 cm AV Area (VTI):     1.95 cm AV Vmax:           143.00 cm/s AV Vmean:          92.800 cm/s AV VTI:            0.322 m AV Peak Grad:      8.2 mmHg AV Mean Grad:      4.0 mmHg LVOT Vmax:         96.00 cm/s LVOT Vmean:        69.000 cm/s LVOT VTI:          0.221 m LVOT/AV VTI ratio: 0.69  AORTA Ao Root diam: 3.00 cm Ao Asc diam:  2.90 cm MITRAL VALVE                TRICUSPID VALVE MV Area (PHT): 3.77 cm     TR Peak grad:   25.2 mmHg MV Peak grad:  4.4 mmHg     TR Vmax:        251.00 cm/s MV Mean grad:  2.0 mmHg MV Vmax:       1.05 m/s     SHUNTS MV Vmean:      60.1 cm/s    Systemic VTI:  0.22 m MV Decel Time: 201 msec     Systemic Diam: 1.90 cm MV E velocity: 92.50 cm/s MV A velocity: 104.00 cm/s MV E/A ratio:  0.89 Rozann Lesches MD Electronically signed by Rozann Lesches MD Signature Date/Time: 06/23/2020/4:11:37 PM    Final    VAS US CAROTID  Result Date: 06/23/2020 Carotid Arterial Duplex Study Indications:       Syncope and atherosclerosis. Risk Factors:      Hypertension, hyperlipidemia. Comparison Study:  no prior Performing Technologist: Abram Sander RVS  Examination Guidelines: A complete evaluation includes B-mode imaging, spectral Doppler, color Doppler, and power Doppler as needed of all accessible portions of each vessel. Bilateral testing is considered an integral part of a complete examination. Limited examinations for reoccurring indications may be performed as noted.  Right Carotid Findings: +----------+--------+--------+--------+------------------+--------+           PSV cm/sEDV cm/sStenosisPlaque DescriptionComments +----------+--------+--------+--------+------------------+--------+ CCA Prox  56      12              heterogenous               +----------+--------+--------+--------+------------------+--------+ CCA Distal43      12               heterogenous               +----------+--------+--------+--------+------------------+--------+ ICA Prox  42      19      1-39%   heterogenous               +----------+--------+--------+--------+------------------+--------+ ICA Distal81      19                                         +----------+--------+--------+--------+------------------+--------+ ECA       58      12                                         +----------+--------+--------+--------+------------------+--------+ +----------+--------+-------+--------+-------------------+  PSV cm/sEDV cmsDescribeArm Pressure (mmHG) +----------+--------+-------+--------+-------------------+ Subclavian108                                        +----------+--------+-------+--------+-------------------+ +---------+--------+--+--------+--+---------+ VertebralPSV cm/s40EDV cm/s13Antegrade +---------+--------+--+--------+--+---------+  Left Carotid Findings: +----------+--------+--------+--------+------------------+--------+           PSV cm/sEDV cm/sStenosisPlaque DescriptionComments +----------+--------+--------+--------+------------------+--------+ CCA Prox  60      16              heterogenous               +----------+--------+--------+--------+------------------+--------+ CCA Distal57      19              heterogenous               +----------+--------+--------+--------+------------------+--------+ ICA Prox  52      16      1-39%   heterogenous               +----------+--------+--------+--------+------------------+--------+ ICA Distal66      19                                         +----------+--------+--------+--------+------------------+--------+ ECA       61      14                                         +----------+--------+--------+--------+------------------+--------+ +----------+--------+--------+--------+-------------------+           PSV cm/sEDV cm/sDescribeArm  Pressure (mmHG) +----------+--------+--------+--------+-------------------+ [id number]                                          +----------+--------+--------+--------+-------------------+ +---------+--------+--+--------+--+---------+ VertebralPSV cm/s52EDV cm/s12Antegrade +---------+--------+--+--------+--+---------+   Summary: Right Carotid: Velocities in the right ICA are consistent with a 1-39% stenosis. Left Carotid: Velocities in the left ICA are consistent with a 1-39% stenosis. Vertebrals: Bilateral vertebral arteries demonstrate antegrade flow. *See table(s) above for measurements and observations.  Electronically signed by Monica Martinez MD on 06/23/2020 at 10:59:51 AM.    Final         Scheduled Meds: . citalopram  20 mg Oral Daily  . feeding supplement (ENSURE ENLIVE)  237 mL Oral BID BM  . irbesartan  300 mg Oral Daily  . pantoprazole  40 mg Oral Daily  . sodium chloride flush  3 mL Intravenous Q12H  . sodium chloride flush  3 mL Intravenous Q12H   Continuous Infusions: . sodium chloride    . sodium chloride       LOS: 0 days        Hosie Poisson, MD [facility]   To contact the attending provider between 7A-7P or the covering provider during after hours 7P-7A, please log into the web site [email or web address] and access using universal [place] password for that web site. If you do not have the password, please call the hospital operator.  06/23/2020, 4:44 PM

## 2020-06-23 NOTE — Evaluation (Signed)
Physical Therapy Evaluation Patient Details Name: Jody Wilson MRN: [id number] DOB: Jul 24, 1951 Today's Date: 06/23/2020   History of Present Illness  69 y.o. female with pertinent past medical history of hyperlipidemia, hypertension, anxiety, anemia, GERD that presents the emergency department today for LOC via EMS.  Patient had 20 minutes of loss of consciousness in and out at home.  Via EMS patient had 8 episodes of loss of consciousness.  While here in the ER 1 witnessed episode of loss of consciousness. Negative for orthostatics. Admitted 06/22/20 for work up on syncopal episodes.   Clinical Impression  PTA pt living with husband in single story home with 1 step to enter. Pt reports independence in mobility, ADLs and iADLs, she provides care for her husband and her son who had a stroke in May. Pt is limited in safe mobility by decreased higher level balance with ambulation. Orthostatic vitals taken during session and pt with increase in BP with sitting up and standing, after 3 min standing BP started back down (see flowsheet). Pt is independent with bed mobility, and transfers and supervision for ambulation without AD however at end of ambulation pt with increased knee buckling and decreased balance. Pt able to self steady but needed to sit down. PT recommending HHPT to work on high level balance activities. PT will continue to follow acutely.    Follow Up Recommendations Home health PT;Supervision for mobility/OOB    Equipment Recommendations  None recommended by PT    Precautions / Restrictions Precautions Precautions: Fall Precaution Comments: multiple syncopal episodes Restrictions Weight Bearing Restrictions: No      Mobility  Bed Mobility Overal bed mobility: Independent                Transfers Overall transfer level: Independent               General transfer comment: good power up and self steadying   Ambulation/Gait Ambulation/Gait assistance: Supervision  Gait Distance (Feet): 50 Feet Assistive device: None Gait Pattern/deviations: Step-through pattern;Decreased stride length;Narrow base of support Gait velocity: slowed   General Gait Details: supervision for safety, pt walks on lateral edges of feet, pt reports she does that to keep her balance, knee buckling at end of ambulation, pt able to self steady.       Balance Overall balance assessment: Mild deficits observed, not formally tested                                           Pertinent Vitals/Pain Pain Assessment: 0-10 Pain Score: 3  Pain Location: headache Pain Descriptors / Indicators: Headache Pain Intervention(s): Limited activity within patient's tolerance;Monitored during session;Repositioned    Home Living Family/patient expects to be discharged to:: Private residence Living Arrangements: Spouse/significant other Available Help at Discharge: Family;Available 24 hours/day Type of Home: House Home Access: Stairs to enter   [employer] of Steps: 1 Home Layout: One level Home Equipment: Walker - 2 wheels;Hand held shower head;Grab bars - tub/shower      Prior Function Level of Independence: Independent         Comments: provides care for husband with OA, and son who had a stroke in May        Extremity/Trunk Assessment   Upper Extremity Assessment Upper Extremity Assessment: Overall WFL for tasks assessed    Lower Extremity Assessment Lower Extremity Assessment: Overall WFL for tasks assessed  Cervical / Trunk Assessment Cervical / Trunk Assessment: Normal  Communication   Communication: No difficulties  Cognition Arousal/Alertness: Awake/alert Behavior During Therapy: WFL for tasks assessed/performed Overall Cognitive Status: Within Functional Limits for tasks assessed                                 General Comments: pt reports she feels like her short term memory has been bad lately       General  Comments General comments (skin integrity, edema, etc.): Othostatic BP recorded, BP increased with positional change        Assessment/Plan    PT Assessment Patient needs continued PT services  PT Problem List Decreased balance;Decreased mobility;Decreased coordination       PT Treatment Interventions DME instruction;Gait training;Functional mobility training;Therapeutic activities;Therapeutic exercise;Balance training;Cognitive remediation;Patient/family education    PT Goals (Current goals can be found in the Care Plan section)  Acute Rehab PT Goals Patient Stated Goal: find out what is going on  PT Goal Formulation: With patient Time For Goal Achievement: 07/07/20 Potential to Achieve Goals: Good    Frequency Min 3X/week    AM-PAC PT "6 Clicks" Mobility  Outcome Measure Help needed turning from your back to your side while in a flat bed without using bedrails?: None Help needed moving from lying on your back to sitting on the side of a flat bed without using bedrails?: None Help needed moving to and from a bed to a chair (including a wheelchair)?: None Help needed standing up from a chair using your arms (e.g., wheelchair or bedside chair)?: None Help needed to walk in hospital room?: None Help needed climbing 3-5 steps with a railing? : None 6 Click Score: 24    End of Session   Activity Tolerance: Patient tolerated treatment well Patient left: in chair;with call bell/phone within reach;with chair alarm set Nurse Communication: Mobility status PT Visit Diagnosis: Unsteadiness on feet (R26.81);Other abnormalities of gait and mobility (R26.89);Difficulty in walking, not elsewhere classified (R26.2)    Time: 5427-0623 PT Time Calculation (min) (ACUTE ONLY): 31 min   Charges:   PT Evaluation $PT Eval Moderate Complexity: 1 Mod PT Treatments $Therapeutic Exercise: 8-22 mins        Cozy Veale B. Migdalia Dk PT, DPT Acute Rehabilitation Services Pager [phone]  Office [phone]   [place] 06/23/2020, 12:48 PM

## 2020-06-23 NOTE — Progress Notes (Signed)
Carotid duplex has been completed.   Preliminary results in CV Proc.   Abram Sander 06/23/2020 9:09 AM

## 2020-06-23 NOTE — Progress Notes (Signed)
Echo in room at this time being completed.

## 2020-06-23 NOTE — Plan of Care (Signed)

## 2020-06-23 NOTE — Progress Notes (Signed)
Patient is with PT at this time and orthostatic vitals are being completed at this time as well.  Patient still has a "twitch" in her face. Patient states she's had the twitch but sometimes it gets really bad but she's aware of her body doing this.  She said she isn't able to control them .

## 2020-06-24 ENCOUNTER — Telehealth: Payer: Self-pay | Admitting: *Deleted

## 2020-06-24 LAB — BASIC METABOLIC PANEL
Anion gap: 12 (ref 5–15)
BUN: 11 mg/dL (ref 8–23)
CO2: 24 mmol/L (ref 22–32)
Calcium: 8.7 mg/dL — ABNORMAL LOW (ref 8.9–10.3)
Chloride: 103 mmol/L (ref 98–111)
Creatinine, Ser: 0.6 mg/dL (ref 0.44–1.00)
GFR calc Af Amer: >60 mL/min (ref >60)
GFR calc non Af Amer: >60 mL/min (ref >60)
Glucose, Bld: 108 mg/dL — ABNORMAL HIGH (ref 70–99)
Potassium: 3.6 mmol/L (ref 3.5–5.1)
Sodium: 139 mmol/L (ref 135–145)

## 2020-06-24 MED ORDER — ENSURE ENLIVE PO LIQD: 237.0000 mL | Freq: Two times a day (BID) | ORAL | 12 refills | Status: DC

## 2020-06-24 MED ORDER — LORAZEPAM 0.5 MG PO TABS: 0.5000 mg | ORAL_TABLET | Freq: Three times a day (TID) | ORAL | 0 refills | Status: DC | PRN

## 2020-06-24 NOTE — TOC Transition Note (Signed)
Transition of Care (TOC) - CM/SW Discharge Note Marvetta Gibbons RN, BSN Transitions of Care Unit 4E- RN Case Manager See Treatment Team for direct phone # Cross Coverage for [place]    Patient Details  Name: LETANYA FROH MRN: [id number] Date of Birth: 1951-07-01  Transition of Care [facility]) CM/SW Contact:  Dawayne Patricia, RN Phone Number: 06/24/2020, 3:40 PM   Clinical Narrative:    Pt stable for transition home today, order placed for HHPT- however pt discharge prior to [facility] being able to speak with pt at bedside- call made to pt and msg left for pt to return call to discuss [facility] needs.  1540- received return call from pt- discussed [facility] PT order- and recommendations pt therapy here. Per pt she states that she does not feel like she will need HH therapy at this time and politely declines a referral for now. Explained pt can f/u with PCP should she change her mind - pt voiced understanding and states she already has an appointment to see her PCP in a few weeks.  No HH referral made at this time.   Final next level of care: [place] Barriers to Discharge: No Barriers Identified   Patient Goals and CMS Choice    Pt declined-     Discharge Placement               home        Discharge Plan and Services                 N/A         HH Arranged: PT, Refused HH          Social Determinants of Health (SDOH) Interventions     Readmission Risk Interventions No flowsheet data found.

## 2020-06-24 NOTE — Telephone Encounter (Signed)
TRANSITIONAL CARE MANAGEMENT TELEPHONE OUTREACH NOTE   Contact Date: 06/24/2020 Contacted By: Truett Mainland, LPN   DISCHARGE INFORMATION Date of Discharge:06/24/20 Discharge Facility: [facility] Discharge Diagnosis:Syncope  Outpatient Follow Up Recommendations (copied from discharge summary) Follow up with PCP with 1-2 weeks Follow up with Cardiologist- will call with appt    Jody Wilson is a female primary care patient of Dettinger, Fransisca Kaufmann, MD. An outgoing telephone call was made today and I spoke with Jody Wilson.  Ms. Brickner condition(s) and treatment(s) were discussed. An opportunity to ask questions was provided and all were answered or forwarded as appropriate.    ACTIVITIES OF DAILY LIVING  [name] lives with their spouse and she can perform ADLs independently. her primary caregiver is herself. she is able to depend on her primary caregiver(s) for consistent help. Transportation to appointments, to pick up medications, and to run errands is not a problem.  (Consider referral to [facility] if transportation or a consistent caregiver is a problem)   Fall Risk Fall Risk  04/04/2020 12/04/2019  Falls in the past year? 0 0  Number falls in past yr: - -  Injury with Fall? - -  Comment - -  Risk for fall due to : - -  Follow up - Falls prevention discussed    medium [place] Modifications/Assistive Devices Wheelchair: No Cane: No Ramp: No Bedside Toilet: No Hospital Bed:  No Other:    [place] she is not receiving home health Nursing, Physical therapy and Speech language pathology services.     MEDICATION RECONCILIATION  Jody Wilson has been able to pick-up all prescribed discharge medications from the pharmacy.   A post discharge medication reconciliation was performed and the complete medication list was reviewed with the patient/caregiver and is current as of 06/24/2020. Changes highlighted below.  Discontinued  Medications Alprazolam  Current Medication List Allergies as of 06/24/2020      Reactions   Tetracyclines & Related Itching, Swelling      Medication List       Accurate as of June 24, 2020  4:48 PM. If you have any questions, ask your nurse or doctor.        calcium-vitamin D 500-200 MG-UNIT tablet Commonly known as: OSCAL WITH D Take 1 tablet by mouth 2 (two) times daily.   citalopram 40 MG tablet Commonly known as: CELEXA Take 1 tablet (40 mg total) by mouth daily. What changed: how much to take   denosumab 60 MG/ML Sosy injection Commonly known as: PROLIA Inject 60 mg into the skin every 6 (six) months.   feeding supplement (ENSURE ENLIVE) Liqd Take 237 mLs by mouth 2 (two) times daily between meals.   hydrochlorothiazide 12.5 MG capsule Commonly known as: MICROZIDE Take 1 capsule (12.5 mg total) by mouth daily.   LORazepam 0.5 MG tablet Commonly known as: ATIVAN Take 1 tablet (0.5 mg total) by mouth every 8 (eight) hours as needed for anxiety.   olmesartan 40 MG tablet Commonly known as: BENICAR TAKE 1 TABLET DAILY   pantoprazole 40 MG tablet Commonly known as: PROTONIX Take 1 tablet (40 mg total) by mouth daily.   valACYclovir 1000 MG tablet Commonly known as: VALTREX Take 2 tablets (2,000 mg total) by mouth 2 (two) times daily. For 2 days What changed:   when to take this  reasons to take this  additional instructions   WOMENS 50+ MULTI VITAMIN/MIN PO Take 1 tablet  by mouth daily.        PATIENT EDUCATION & FOLLOW-UP PLAN  An appointment for Transitional Care Management is scheduled with Hendricks Limes, FNP  on 06/26/20 at 2:20pm  Take all medications as prescribed  Contact our office by calling [phone] if you have any questions or concerns

## 2020-06-24 NOTE — Progress Notes (Signed)
Initial Nutrition Assessment  DOCUMENTATION CODES:   Not applicable  INTERVENTION:   -Continue Ensure Enlive po BID, each supplement provides 350 kcal and 20 grams of protein  NUTRITION DIAGNOSIS:   Inadequate oral intake related to decreased appetite as evidenced by per patient/family report.  GOAL:   Patient will meet greater than or equal to 90% of their needs  MONITOR:   PO intake, Supplement acceptance, Labs, Weight trends, Skin, I & O's  REASON FOR ASSESSMENT:   Malnutrition Screening Tool    ASSESSMENT:   69 year old lady with prior history of depression, hypertension, hyperlipidemia, GERD recently diagnosed liver steatosis, hiatal hernia presents to ED for syncope.  Pt admitted with syncope likely related to orthostatic hypotension.  Reviewed I/O's: +1.2 L x 24 hours and +2.2 L since admission  Spoke with pt and son at bedside. Per pt and son, pt has experienced a decreased appetite since November 2020. Pt son reports that she only eats about one meal per day and he "has to force her to do it". Per pt, she has been consuming smaller portions secondary to diarrhea after eating- per her report, this occurs right after a meal. She explains that she has been seeing GI as outpatient and has a colonoscopy scheduled for later this month and hopes that this will shed some light into what is going on. Per her report, no food exacerbates diarrhea or improves it.   Pt eats around 3 times per day- Breakfast: pack of crackers; Lunch: sandwich; Dinner: meat, starch, and vegetable. Her appetite has improved since since admitted to the hospital, but is still experiencing symptoms of diarrhea after eating. Per her report, she consumed 100% of breakfast.   Per pt, UBW is around 160#, which she last weighed about 8 months ago. Reviewed wt hx; pt has experienced a 1.8% wt loss over the past 3 months, which is not significant for time frame. She reports feeling more fatigued, but no changes  in mobility.  Discussed importance of good meal and supplement intake to promote healing. Pt with no further questions and appreciative of RD visit. She is hopeful to be discharged home today.  Medications reviewed and include 0.9% sodium chloride infusion @ 75 ml/hr.   Labs reviewed: K: 3.3.   NUTRITION - FOCUSED PHYSICAL EXAM:    Most Recent Value  Orbital Region No depletion  Upper Arm Region No depletion  Thoracic and Lumbar Region No depletion  Buccal Region No depletion  Temple Region No depletion  Clavicle Bone Region No depletion  Clavicle and Acromion Bone Region No depletion  Scapular Bone Region No depletion  Dorsal Hand No depletion  Patellar Region No depletion  Anterior Thigh Region No depletion  Posterior Calf Region No depletion  Edema (RD Assessment) Mild  Hair Reviewed  Eyes Reviewed  Mouth Reviewed  Skin Reviewed  Nails Reviewed       Diet Order:   Diet Order            Diet - low sodium heart healthy           Diet Heart Room service appropriate? Yes; Fluid consistency: Thin  Diet effective now                 EDUCATION NEEDS:   Education needs have been addressed  Skin:  Skin Assessment: Reviewed RN Assessment  Last BM:  06/23/20  Height:   Ht Readings from Last 1 Encounters:  06/22/20 5\' 5"  (1.651 m)    Weight:  Wt Readings from Last 1 Encounters:  06/24/20 66 kg    Ideal Body Weight:     BMI:  Body mass index is 24.2 kg/m.  Estimated Nutritional Needs:   Kcal:  1800-2000  Protein:  100-115 grams  Fluid:  > 1.8 L    Loistine Chance, RD, LDN, [place] Registered Dietitian II Certified Diabetes Care and Education Specialist Please refer to [facility] for RD and/or RD on-call/weekend/after hours pager

## 2020-06-26 ENCOUNTER — Encounter: Payer: Self-pay | Admitting: Family Medicine

## 2020-06-26 ENCOUNTER — Other Ambulatory Visit: Payer: Self-pay

## 2020-06-26 ENCOUNTER — Ambulatory Visit (INDEPENDENT_AMBULATORY_CARE_PROVIDER_SITE_OTHER): Payer: Medicare Other | Admitting: Family Medicine

## 2020-06-26 VITALS — BP 134/85 | HR 91 | Temp 97.1°F | Ht 65.0 in | Wt 143.2 lb

## 2020-06-26 DIAGNOSIS — G514 Facial myokymia: Secondary | ICD-10-CM

## 2020-06-26 DIAGNOSIS — R55 Syncope and collapse: Secondary | ICD-10-CM | POA: Diagnosis not present

## 2020-06-26 MED ORDER — LORAZEPAM 0.5 MG PO TABS: 0.5000 mg | ORAL_TABLET | Freq: Three times a day (TID) | ORAL | 0 refills | Status: DC | PRN

## 2020-06-26 NOTE — Progress Notes (Signed)
Assessment & Plan:  1. Syncope, unspecified syncope type - Patient has not had any further episodes of syncope. She does not yet have her appointment scheduled with the cardiologist. We did discuss if she does not hear from them within a week from discharge she needs to call and make sure she gets her appointment scheduled.  2. Facial twitching - Discussed with patient that I cannot be the one to decide if she remains on Ativan, she will need to discuss this with her PCP. She does have an appointment with him later this month. I made sure she had enough to last her until that appointment.   Follow up plan: Return as scheduled.  Hendricks Limes, MSN, APRN, FNP-C [facility]  Subjective:   Patient ID: Jody Wilson, female    DOB: 27-Aug-1951, 69 y.o.   MRN: [id number]  HPI: Jody Wilson is a 69 y.o. female presenting on 06/26/2020 for Transitions Of Care [facility]- synocpe 06/22/20)  Patient was admitted to [facility] from 06/22/2020 - 06/24/2020 due to loss of consciousness. ER note reports patient had 9 episodes of loss of consciousness. She was admitted for observation but had no further episodes. Work-up was negative. She was discharged with instructions to follow-up with cardiology for a Holter monitor. She denies any episodes since hospital discharge. She does report that while she was in the hospital her Xanax was switched to Ativan which has resolved the uncontrollable twitching in her face. She is only taking 1/day.   ROS: Negative unless specifically indicated above in HPI.   Relevant past medical history reviewed and updated as indicated.   Allergies and medications reviewed and updated.   Current Outpatient Medications:  .  calcium-vitamin D (OSCAL WITH D) 500-200 MG-UNIT tablet, Take 1 tablet by mouth 2 (two) times daily. , Disp: , Rfl:  .  citalopram (CELEXA) 40 MG tablet, Take 1 tablet (40 mg total) by mouth daily. (Patient taking differently: Take 20 mg by  mouth daily. ), Disp: 90 tablet, Rfl: 3 .  denosumab (PROLIA) 60 MG/ML SOSY injection, Inject 60 mg into the skin every 6 (six) months., Disp: 2 mL, Rfl: 0 .  feeding supplement, ENSURE ENLIVE, (ENSURE ENLIVE) LIQD, Take 237 mLs by mouth 2 (two) times daily between meals., Disp: 237 mL, Rfl: 12 .  hydrochlorothiazide (MICROZIDE) 12.5 MG capsule, Take 1 capsule (12.5 mg total) by mouth daily., Disp: 90 capsule, Rfl: 3 .  LORazepam (ATIVAN) 0.5 MG tablet, Take 1 tablet (0.5 mg total) by mouth every 8 (eight) hours as needed for anxiety., Disp: 10 tablet, Rfl: 0 .  Multiple Vitamins-Minerals (WOMENS 50+ MULTI VITAMIN/MIN PO), Take 1 tablet by mouth daily. , Disp: , Rfl:  .  olmesartan (BENICAR) 40 MG tablet, TAKE 1 TABLET DAILY, Disp: 90 tablet, Rfl: 0 .  pantoprazole (PROTONIX) 40 MG tablet, Take 1 tablet (40 mg total) by mouth daily., Disp: 90 tablet, Rfl: 3 .  valACYclovir (VALTREX) 1000 MG tablet, Take 2 tablets (2,000 mg total) by mouth 2 (two) times daily. For 2 days (Patient taking differently: Take 2,000 mg by mouth 2 (two) times daily as needed (Fever blisters). ), Disp: 8 tablet, Rfl: 10  Current Facility-Administered Medications:  .  denosumab (PROLIA) injection 60 mg, 60 mg, Subcutaneous, Q6 months, Dettinger, Fransisca Kaufmann, MD, 60 mg at 11/14/19 1553  Allergies  Allergen Reactions  . Tetracyclines & Related Itching and Swelling    Objective:   BP 134/85   Pulse 91  Temp (!) 97.1 F (36.2 C) (Temporal)   Ht 5\' 5"  (1.651 m)   Wt 143 lb 3.2 oz (65 kg)   SpO2 96%   BMI 23.83 kg/m    Physical Exam Vitals reviewed.  Constitutional:      General: She is not in acute distress.    Appearance: Normal appearance. She is normal weight. She is not ill-appearing, toxic-appearing or diaphoretic.  HENT:     Head: Normocephalic and atraumatic.  Eyes:     General: No scleral icterus.       Right eye: No discharge.        Left eye: No discharge.     Conjunctiva/sclera: Conjunctivae  normal.  Cardiovascular:     Rate and Rhythm: Normal rate and regular rhythm.     Heart sounds: Normal heart sounds. No murmur heard.  No friction rub. No gallop.   Pulmonary:     Effort: Pulmonary effort is normal. No respiratory distress.     Breath sounds: Normal breath sounds. No stridor. No wheezing, rhonchi or rales.  Musculoskeletal:        General: Normal range of motion.     Cervical back: Normal range of motion.  Skin:    General: Skin is warm and dry.     Capillary Refill: Capillary refill takes less than 2 seconds.  Neurological:     General: No focal deficit present.     Mental Status: She is alert and oriented to person, place, and time. Mental status is at baseline.  Psychiatric:        Mood and Affect: Mood normal.        Behavior: Behavior normal.        Thought Content: Thought content normal.        Judgment: Judgment normal.

## 2020-06-30 NOTE — Discharge Summary (Signed)
Physician Discharge Summary  Jody Wilson GEX:[id number] DOB: [birth year] DOA: 06/22/2020  PCP: Dettinger, Fransisca Kaufmann, MD  Admit date: 06/22/2020 Discharge date: 06/24/2020  Admitted From: Home.  Disposition:  HOme.   Recommendations for Outpatient Follow-up:  1. Follow up with PCP in 1-2 weeks 2. Please obtain BMP/CBC in one week Please follow up with Holter Monitor Placement.    Discharge Condition: stable.  CODE STATUS: Full code,  Diet recommendation: Heart Healthy   Brief/Interim Summary: 69 year old lady with prior history of depression, hypertension, hyperlipidemia, GERD recently diagnosed liver steatosis, hiatal hernia presents to ED for syncope.  Discharge Diagnoses:  Principal Problem:   Syncope Active Problems:   Hypertension   GERD (gastroesophageal reflux disease)   Anxiety and depression   Hiatal hernia   Thoracic aortic atherosclerosis (HCC)  Syncope Probably secondary to orthostatic hypotension CT of the head does not show any acute intracranial abnormalities. MRI of the brain does not show any acute stroke showed mild white matter cerebral disease secondary to chronic ischemic disease. Carotid duplex does not show stenosis to explain her symptoms Echocardiogram showed LVEF of 60 to 65%. No regional wall motion abnormalities. Left ventricular diastolic parameters are indeterminate.  Recommend outpatient follow up with cardiology for holter monitor placement for further evaluation.     Small focal outpouching approximately 7 x 7 mm along the distal thoracic aorta may represent a penetrating atherosclerotic ulcer not associated with acute features and unchanged since previous chest CT.  Consider CT angiogram follow-up in 1 year with vascular surgery consultation to guide further follow-up.    Large hiatal hernia extending into the chest  On the CT chest: / GERD Recommend outpatient follow up with GI for EGD.  Continue with PPI.    Essential  hypertension;  BP parameters are slightly on the high normal.  Since she was orthostatic in the ED, gently hydrate and repeat orthostatic vital signs today wnl.    Anxiety and depression: Resume home meds.     Compression fracture at T11  PAIN control and PT evaluation.    Hypokalemia:  Replaced.   Discharge Instructions  Discharge Instructions    Diet - low sodium heart healthy   Complete by: As directed    Discharge instructions   Complete by: As directed    Please follow up with cardiology for Holter Monitor placement for evaluation of syncope.     Allergies as of 06/24/2020      Reactions   Tetracyclines & Related Itching, Swelling      Medication List    STOP taking these medications   albuterol 108 (90 Base) MCG/ACT inhaler Commonly known as: VENTOLIN HFA   ALPRAZolam 0.5 MG tablet Commonly known as: XANAX   clobetasol 0.05 % external solution Commonly known as: TEMOVATE   GOODYS BODY PAIN PO   mupirocin ointment 2 % Commonly known as: Bactroban   tiZANidine 4 MG capsule Commonly known as: ZANAFLEX     TAKE these medications   calcium-vitamin D 500-200 MG-UNIT tablet Commonly known as: OSCAL WITH D Take 1 tablet by mouth 2 (two) times daily.   citalopram 40 MG tablet Commonly known as: CELEXA Take 1 tablet (40 mg total) by mouth daily. What changed: how much to take   denosumab 60 MG/ML Sosy injection Commonly known as: PROLIA Inject 60 mg into the skin every 6 (six) months.   feeding supplement (ENSURE ENLIVE) Liqd Take 237 mLs by mouth 2 (two) times daily between meals.   hydrochlorothiazide 12.5  MG capsule Commonly known as: MICROZIDE Take 1 capsule (12.5 mg total) by mouth daily.   olmesartan 40 MG tablet Commonly known as: BENICAR TAKE 1 TABLET DAILY   pantoprazole 40 MG tablet Commonly known as: PROTONIX Take 1 tablet (40 mg total) by mouth daily.   valACYclovir 1000 MG tablet Commonly known as: VALTREX Take 2 tablets  (2,000 mg total) by mouth 2 (two) times daily. For 2 days What changed:   when to take this  reasons to take this  additional instructions   WOMENS 50+ MULTI VITAMIN/MIN PO Take 1 tablet by mouth daily.       Follow-up Information    Dettinger, Fransisca Kaufmann, MD. Schedule an appointment as soon as possible for a visit in 1 week(s).   Specialties: Family Medicine, Cardiology Contact information: [place] [address] [phone]        Rex Kras, DO Follow up in 1 week(s).   Specialties: Cardiology, Radiology, Vascular Surgery Why: for event monitor placement, for syncope evaluation.  Contact information: [place] [address] [phone]              Allergies  Allergen Reactions  . Tetracyclines & Related Itching and Swelling    Consultations:  None.   Procedures/Studies: CT HEAD WO CONTRAST  Result Date: 06/22/2020 CLINICAL DATA:  Found unresponsive with subsequent syncopal episodes EXAM: CT HEAD WITHOUT CONTRAST TECHNIQUE: Contiguous axial images were obtained from the base of the skull through the vertex without intravenous contrast. COMPARISON:  None. FINDINGS: Brain: No evidence of acute infarction, hemorrhage, hydrocephalus, extra-axial collection or mass lesion/mass effect. Vascular: No hyperdense vessel or unexpected calcification. Skull: Normal. Negative for fracture or focal lesion. Sinuses/Orbits: Mucosal thickening is noted within the ethmoid sinus likely of a chronic nature. Other: None IMPRESSION: No acute intracranial abnormality. Mild mucosal thickening in the sphenoid sinus. Electronically Signed   By: Inez Catalina M.D.   On: 06/22/2020 22:28   MR BRAIN WO CONTRAST  Result Date: 06/23/2020 CLINICAL DATA:  Initial evaluation for acute syncope. EXAM: MRI HEAD WITHOUT CONTRAST TECHNIQUE: Multiplanar, multiecho pulse sequences of the brain and surrounding structures were obtained without intravenous contrast.  COMPARISON:  Prior head CT from 06/22/2020. FINDINGS: Brain: Cerebral volume within normal limits for age. Scattered subcentimeter foci of T2/FLAIR hyperintensity noted within the periventricular, deep, and subcortical white matter both cerebral hemispheres, nonspecific, but most commonly related to chronic microvascular ischemic disease. Overall, appearance is mild for age. No evidence for acute or subacute infarct. Gray-white matter differentiation maintained. No encephalomalacia to suggest chronic cortical infarction. No evidence for acute or chronic intracranial hemorrhage. No mass lesion, midline shift or mass effect. No hydrocephalus or extra-axial fluid collection. Pituitary gland suprasellar region normal. Midline structures intact. Vascular: Major intracranial vascular flow voids are maintained. Skull and upper cervical spine: Craniocervical junction within normal limits. Upper cervical spine normal. Bone marrow signal intensity diffusely heterogeneous without focal marrow replacing lesion. No scalp soft tissue abnormality. Sinuses/Orbits: Globes and orbital soft tissues within normal limits. Scattered mucosal thickening noted throughout the paranasal sinuses. No air-fluid levels to suggest acute sinusitis. No significant mastoid effusion. Inner ear structures grossly normal. Other: None. IMPRESSION: 1. No acute intracranial abnormality. 2. 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 Electronically Signed   By: Jeannine Boga M.D.   On: 06/23/2020 02:21   MR Abdomen W Wo Contrast  Addendum Date: 06/03/2020   ADDENDUM  REPORT: 06/03/2020 12:46 ADDENDUM: These results will be called to the ordering clinician or representative by the Radiologist Assistant, and communication documented in the PACS or [employer]. Electronically Signed   By: Zetta Bills M.D.   On: 06/03/2020 12:46   Result Date: 06/03/2020 CLINICAL DATA:   Abnormal findings on low-dose chest CT in the liver. Also with 6 months of bloating EXAM: MRI ABDOMEN WITHOUT AND WITH CONTRAST TECHNIQUE: Multiplanar multisequence MR imaging of the abdomen was performed both before and after the administration of intravenous contrast. CONTRAST:  26mL GADAVIST GADOBUTROL 1 MMOL/ML IV SOLN COMPARISON:  CT chest from 04/25/2020 FINDINGS: Lower chest: No consolidation. No pleural effusion. Limited assessment of the lung bases. Hepatobiliary: Post cholecystectomy. No biliary ductal dilation. Geographic steatotic change in the area of concern with mild global hepatic steatosis, more pronounced in hepatic subsegment IV/VIII at the dome of the liver. Area with homogeneous enhancement matching that of surrounding liver. No focal, suspicious hepatic lesion. Portal vein is patent. Pancreas: Pancreas is normal without ductal dilation or inflammation. Spleen:  Spleen normal size without focal lesion. Adrenals/Urinary Tract: Adrenal glands are normal. Renal contours are smooth. No hydronephrosis. Symmetric renal enhancement. Stomach/Bowel: Large hiatal hernia extending into the chest as on the recent CT of the chest. Bowel is normal to the extent evaluated. Vascular/Lymphatic: Calcified atheromatous plaque of the abdominal aorta better evaluated on recent chest CT. Small focal outpouching along the aorta at the aortic hiatus may represent a penetrating atherosclerotic ulcer/ulcerated plaque, also seen on previous chest CT and not associated with signs of inflammation. Or other acute features such as intramural hematoma. No adenopathy in the abdomen. Other:  None. Musculoskeletal: Compression fracture at T11 better demonstrated on recent chest CT. IMPRESSION: 1. Area of concern on prior chest CT represents geographic fat intensification on a background of global mild hepatic steatosis. No suspicious lesion in the liver with lobular contours. Correlate with any clinical or laboratory evidence of  liver disease. 2. Small focal outpouching approximately 7 x 7 mm along the distal thoracic aorta may represent a penetrating atherosclerotic ulcer not associated with acute features and unchanged since previous chest CT. Consider CT angiogram follow-up in 1 year with vascular surgery consultation to guide further follow-up. 3. Large hiatal hernia extending into the chest as on the recent CT of the chest. 4. Compression fracture at T11 better demonstrated on recent chest CT. 5. Aortic atherosclerosis. Aortic Atherosclerosis (ICD10-I70.0). Electronically Signed: By: Zetta Bills M.D. On: 06/03/2020 12:41   DG Chest Port 1 View  Result Date: 06/22/2020 CLINICAL DATA:  69 year old female with syncope. EXAM: PORTABLE CHEST 1 VIEW COMPARISON:  Chest radiograph dated 07/29/2015. Chest CT dated 04/25/2020. FINDINGS: Mild chronic interstitial coarsening. No focal consolidation, pleural effusion, or pneumothorax. The cardiac silhouette is within limits. Atherosclerotic calcification of the aorta. No acute osseous pathology. Cervical ACDF. IMPRESSION: No active cardiopulmonary disease. Electronically Signed   By: Anner Crete M.D.   On: 06/22/2020 22:15   ECHOCARDIOGRAM COMPLETE  Result Date: 06/23/2020    ECHOCARDIOGRAM REPORT   Patient Name:   Jody Wilson Date of Exam: 06/23/2020 Medical Rec #:  [id number]       Height:       65.0 in Accession #:    [id number]      Weight:       144.1 lb Date of Birth:  1951-09-03      BSA:          1.721 m Patient Age:  68 years        BP:           149/93 mmHg Patient Gender: F               HR:           65 bpm. Exam Location:  Inpatient Procedure: 2D Echo, Color Doppler and Cardiac Doppler Indications:    Syncope  History:        Patient has no prior history of Echocardiogram examinations.                 Risk Factors:Hypertension, Dyslipidemia and Current Smoker.  Sonographer:    Clayton Lefort RDCS (AE) Referring Phys: [id number] [place]  1. Left  ventricular ejection fraction, by estimation, is 60 to 65%. The left ventricle has normal function. The left ventricle has no regional wall motion abnormalities. Left ventricular diastolic parameters are indeterminate.  2. Right ventricular systolic function is normal. The right ventricular size is normal. There is normal pulmonary artery systolic pressure. The estimated right ventricular systolic pressure is 01.7 mmHg.  3. The mitral valve is grossly normal. Mild mitral valve regurgitation.  4. The aortic valve is tricuspid. Aortic valve regurgitation is not visualized. Mild to moderate aortic valve sclerosis/calcification is present, without any evidence of aortic stenosis. Aortic valve mean gradient measures 4.0 mmHg.  5. The inferior vena cava is normal in size with greater than 50% respiratory variability, suggesting right atrial pressure of 3 mmHg. FINDINGS  Left Ventricle: Left ventricular ejection fraction, by estimation, is 60 to 65%. The left ventricle has normal function. The left ventricle has no regional wall motion abnormalities. The left ventricular internal cavity size was normal in size. There is  borderline left ventricular hypertrophy. Left ventricular diastolic parameters are indeterminate. Right Ventricle: The right ventricular size is normal. No increase in right ventricular wall thickness. Right ventricular systolic function is normal. There is normal pulmonary artery systolic pressure. The tricuspid regurgitant velocity is 2.51 m/s, and  with an assumed right atrial pressure of 3 mmHg, the estimated right ventricular systolic pressure is 79.3 mmHg. Left Atrium: Left atrial size was normal in size. Right Atrium: Right atrial size was normal in size. Pericardium: There is no evidence of pericardial effusion. Presence of pericardial fat pad. Mitral Valve: The mitral valve is grossly normal. Mild mitral annular calcification. Mild mitral valve regurgitation. MV peak gradient, 4.4 mmHg. The mean  mitral valve gradient is 2.0 mmHg. Tricuspid Valve: The tricuspid valve is grossly normal. Tricuspid valve regurgitation is mild. Aortic Valve: The aortic valve is tricuspid. Aortic valve regurgitation is not visualized. Mild to moderate aortic valve sclerosis/calcification is present, without any evidence of aortic stenosis. Mild to moderate aortic valve annular calcification. Aortic valve mean gradient measures 4.0 mmHg. Aortic valve peak gradient measures 8.2 mmHg. Aortic valve area, by VTI measures 1.95 cm. Pulmonic Valve: The pulmonic valve was not well visualized. Pulmonic valve regurgitation is trivial. Aorta: The aortic root is normal in size and structure. Venous: The inferior vena cava is normal in size with greater than 50% respiratory variability, suggesting right atrial pressure of 3 mmHg. IAS/Shunts: No atrial level shunt detected by color flow Doppler.  LEFT VENTRICLE PLAX 2D LVIDd:         4.10 cm  Diastology LVIDs:         2.60 cm  LV e' lateral:   6.85 cm/s LV PW:         0.90 cm  LV  E/e' lateral: 13.5 LV IVS:        1.00 cm  LV e' medial:    7.51 cm/s LVOT diam:     1.90 cm  LV E/e' medial:  12.3 LV SV:         63 LV SV Index:   36 LVOT Area:     2.84 cm  RIGHT VENTRICLE             IVC RV Basal diam:  2.70 cm     IVC diam: 1.80 cm RV S prime:     12.30 cm/s TAPSE (M-mode): 2.5 cm LEFT ATRIUM             Index       RIGHT ATRIUM           Index LA diam:        2.30 cm 1.34 cm/m  RA Area:     15.10 cm LA Vol (A2C):   42.0 ml 24.41 ml/m RA Volume:   37.50 ml  21.79 ml/m LA Vol (A4C):   36.5 ml 21.21 ml/m LA Biplane Vol: 41.2 ml 23.94 ml/m  AORTIC VALVE AV Area (Vmax):    1.90 cm AV Area (Vmean):   2.11 cm AV Area (VTI):     1.95 cm AV Vmax:           143.00 cm/s AV Vmean:          92.800 cm/s AV VTI:            0.322 m AV Peak Grad:      8.2 mmHg AV Mean Grad:      4.0 mmHg LVOT Vmax:         96.00 cm/s LVOT Vmean:        69.000 cm/s LVOT VTI:          0.221 m LVOT/AV VTI ratio: 0.69   AORTA Ao Root diam: 3.00 cm Ao Asc diam:  2.90 cm MITRAL VALVE                TRICUSPID VALVE MV Area (PHT): 3.77 cm     TR Peak grad:   25.2 mmHg MV Peak grad:  4.4 mmHg     TR Vmax:        251.00 cm/s MV Mean grad:  2.0 mmHg MV Vmax:       1.05 m/s     SHUNTS MV Vmean:      60.1 cm/s    Systemic VTI:  0.22 m MV Decel Time: 201 msec     Systemic Diam: 1.90 cm MV E velocity: 92.50 cm/s MV A velocity: 104.00 cm/s MV E/A ratio:  0.89 Rozann Lesches MD Electronically signed by Rozann Lesches MD Signature Date/Time: 06/23/2020/4:11:37 PM    Final    VAS US CAROTID  Result Date: 06/23/2020 Carotid Arterial Duplex Study Indications:       Syncope and atherosclerosis. Risk Factors:      Hypertension, hyperlipidemia. Comparison Study:  no prior Performing Technologist: Abram Sander RVS  Examination Guidelines: A complete evaluation includes B-mode imaging, spectral Doppler, color Doppler, and power Doppler as needed of all accessible portions of each vessel. Bilateral testing is considered an integral part of a complete examination. Limited examinations for reoccurring indications may be performed as noted.  Right Carotid Findings: +----------+--------+--------+--------+------------------+--------+           PSV cm/sEDV cm/sStenosisPlaque DescriptionComments +----------+--------+--------+--------+------------------+--------+ CCA Prox  56      12  heterogenous               +----------+--------+--------+--------+------------------+--------+ CCA Distal43      12              heterogenous               +----------+--------+--------+--------+------------------+--------+ ICA Prox  42      19      1-39%   heterogenous               +----------+--------+--------+--------+------------------+--------+ ICA Distal81      19                                         +----------+--------+--------+--------+------------------+--------+ ECA       58      12                                          +----------+--------+--------+--------+------------------+--------+ +----------+--------+-------+--------+-------------------+           PSV cm/sEDV cmsDescribeArm Pressure (mmHG) +----------+--------+-------+--------+-------------------+ Subclavian108                                        +----------+--------+-------+--------+-------------------+ +---------+--------+--+--------+--+---------+ VertebralPSV cm/s40EDV cm/s13Antegrade +---------+--------+--+--------+--+---------+  Left Carotid Findings: +----------+--------+--------+--------+------------------+--------+           PSV cm/sEDV cm/sStenosisPlaque DescriptionComments +----------+--------+--------+--------+------------------+--------+ CCA Prox  60      16              heterogenous               +----------+--------+--------+--------+------------------+--------+ CCA Distal57      19              heterogenous               +----------+--------+--------+--------+------------------+--------+ ICA Prox  52      16      1-39%   heterogenous               +----------+--------+--------+--------+------------------+--------+ ICA Distal66      19                                         +----------+--------+--------+--------+------------------+--------+ ECA       61      14                                         +----------+--------+--------+--------+------------------+--------+ +----------+--------+--------+--------+-------------------+           PSV cm/sEDV cm/sDescribeArm Pressure (mmHG) +----------+--------+--------+--------+-------------------+ [id number]                                          +----------+--------+--------+--------+-------------------+ +---------+--------+--+--------+--+---------+ VertebralPSV cm/s52EDV cm/s12Antegrade +---------+--------+--+--------+--+---------+   Summary: Right Carotid: Velocities in the right ICA are consistent with a 1-39%  stenosis. Left Carotid: Velocities in the left ICA are consistent with a 1-39% stenosis. Vertebrals: Bilateral vertebral arteries demonstrate antegrade flow. *See table(s) above for measurements and observations.  Electronically  signed by Monica Martinez MD on 06/23/2020 at 10:59:51 AM.    Final        Subjective:  No new complaints  Discharge Exam: Vitals:   06/24/20 0019 06/24/20 0346  BP: (!) 159/95 (!) 170/90  Pulse: 99 64  Resp: 18 16  Temp: 98 F (36.7 C) 97.9 F (36.6 C)  SpO2: 93% 94%   Vitals:   06/23/20 1652 06/23/20 1942 06/24/20 0019 06/24/20 0346  BP: (!) 160/94 129/62 (!) 159/95 (!) 170/90  Pulse: 68 67 99 64  Resp: 18 16 18 16   Temp: 98.2 F (36.8 C) 98.1 F (36.7 C) 98 F (36.7 C) 97.9 F (36.6 C)  TempSrc: Oral Oral Oral Oral  SpO2: 96% 96% 93% 94%  Weight:    66 kg  Height:        General: Pt is alert, awake, not in acute distress Cardiovascular: RRR, S1/S2 +, no rubs, no gallops Respiratory: CTA bilaterally, no wheezing, no rhonchi Abdominal: Soft, NT, ND, bowel sounds + Extremities: no edema, no cyanosis    The results of significant diagnostics from this hospitalization (including imaging, microbiology, ancillary and laboratory) are listed below for reference.     Microbiology: Recent Results (from the past 240 hour(s))  SARS Coronavirus 2 by RT PCR (hospital order, performed in [facility] hospital lab) Nasopharyngeal Nasopharyngeal Swab     Status: None   Collection Time: 06/23/20 12:06 AM   Specimen: Nasopharyngeal Swab  Result Value Ref Range Status   SARS Coronavirus 2 NEGATIVE NEGATIVE Final    Comment: (NOTE) SARS-CoV-2 target nucleic acids are NOT DETECTED.  The SARS-CoV-2 RNA is generally detectable in upper and lower respiratory specimens during the acute phase of infection. The lowest concentration of SARS-CoV-2 viral copies this assay can detect is 250 copies / mL. A negative result does not preclude SARS-CoV-2  infection and should not be used as the sole basis for treatment or other patient management decisions.  A negative result may occur with improper specimen collection / handling, submission of specimen other than nasopharyngeal swab, presence of viral mutation(s) within the areas targeted by this assay, and inadequate number of viral copies (<250 copies / mL). A negative result must be combined with clinical observations, patient history, and epidemiological information.  Fact Sheet for Patients:   StrictlyIdeas.no  Fact Sheet for Healthcare Providers: BankingDealers.co.za  This test is not yet approved or  cleared by the [place] FDA and has been authorized for detection and/or diagnosis of SARS-CoV-2 by FDA under an Emergency Use Authorization (EUA).  This EUA will remain in effect (meaning this test can be used) for the duration of the COVID-19 declaration under Section 564(b)(1) of the Act, 21 U.S.C. section 360bbb-3(b)(1), unless the authorization is terminated or revoked sooner.  Performed at [facility], [place] [address], [place], [place] [address]      Labs: BNP (last 3 results) No results for input(s): BNP in the last 8760 hours. Basic Metabolic Panel: Recent Labs  Lab 06/24/20 0845  NA 139  K 3.6  CL 103  CO2 24  GLUCOSE 108*  BUN 11  CREATININE 0.60  CALCIUM 8.7*   Liver Function Tests: No results for input(s): AST, ALT, ALKPHOS, BILITOT, PROT, ALBUMIN in the last 168 hours. No results for input(s): LIPASE, AMYLASE in the last 168 hours. No results for input(s): AMMONIA in the last 168 hours. CBC: No results for input(s): WBC, NEUTROABS, HGB, HCT, MCV, PLT in the last 168 hours.  Cardiac Enzymes: No results for input(s): CKTOTAL, CKMB, CKMBINDEX, TROPONINI in the last 168 hours. BNP: Invalid input(s): POCBNP CBG: No results for input(s): GLUCAP in the last 168 hours. D-Dimer No results for  input(s): DDIMER in the last 72 hours. Hgb A1c No results for input(s): HGBA1C in the last 72 hours. Lipid Profile No results for input(s): CHOL, HDL, LDLCALC, TRIG, CHOLHDL, LDLDIRECT in the last 72 hours. Thyroid function studies No results for input(s): TSH, T4TOTAL, T3FREE, THYROIDAB in the last 72 hours.  Invalid input(s): FREET3 Anemia work up No results for input(s): VITAMINB12, FOLATE, FERRITIN, TIBC, IRON, RETICCTPCT in the last 72 hours. Urinalysis    Component Value Date/Time   COLORURINE YELLOW 06/22/2020 2237   APPEARANCEUR CLEAR 06/22/2020 2237   LABSPEC 1.005 06/22/2020 2237   PHURINE 6.0 06/22/2020 2237   GLUCOSEU NEGATIVE 06/22/2020 2237   HGBUR NEGATIVE 06/22/2020 2237   BILIRUBINUR NEGATIVE 06/22/2020 2237   BILIRUBINUR neg 04/11/2014 1713   KETONESUR NEGATIVE 06/22/2020 2237   PROTEINUR NEGATIVE 06/22/2020 2237   UROBILINOGEN negative 04/11/2014 1713   NITRITE NEGATIVE 06/22/2020 2237   LEUKOCYTESUR NEGATIVE 06/22/2020 2237   Sepsis Labs Invalid input(s): PROCALCITONIN,  WBC,  LACTICIDVEN Microbiology Recent Results (from the past 240 hour(s))  SARS Coronavirus 2 by RT PCR (hospital order, performed in [facility] hospital lab) Nasopharyngeal Nasopharyngeal Swab     Status: None   Collection Time: 06/23/20 12:06 AM   Specimen: Nasopharyngeal Swab  Result Value Ref Range Status   SARS Coronavirus 2 NEGATIVE NEGATIVE Final    Comment: (NOTE) SARS-CoV-2 target nucleic acids are NOT DETECTED.  The SARS-CoV-2 RNA is generally detectable in upper and lower respiratory specimens during the acute phase of infection. The lowest concentration of SARS-CoV-2 viral copies this assay can detect is 250 copies / mL. A negative result does not preclude SARS-CoV-2 infection and should not be used as the sole basis for treatment or other patient management decisions.  A negative result may occur with improper specimen collection / handling, submission of specimen  other than nasopharyngeal swab, presence of viral mutation(s) within the areas targeted by this assay, and inadequate number of viral copies (<250 copies / mL). A negative result must be combined with clinical observations, patient history, and epidemiological information.  Fact Sheet for Patients:   StrictlyIdeas.no  Fact Sheet for Healthcare Providers: BankingDealers.co.za  This test is not yet approved or  cleared by the [place] FDA and has been authorized for detection and/or diagnosis of SARS-CoV-2 by FDA under an Emergency Use Authorization (EUA).  This EUA will remain in effect (meaning this test can be used) for the duration of the COVID-19 declaration under Section 564(b)(1) of the Act, 21 U.S.C. section 360bbb-3(b)(1), unless the authorization is terminated or revoked sooner.  Performed at [facility], [place] [address] [place], [place] [address]      Time coordinating discharge: 35 minutes.   SIGNED:   Hosie Poisson, MD  [facility]

## 2020-07-09 ENCOUNTER — Encounter: Payer: Self-pay | Admitting: Family Medicine

## 2020-07-09 ENCOUNTER — Ambulatory Visit (INDEPENDENT_AMBULATORY_CARE_PROVIDER_SITE_OTHER): Payer: Medicare Other | Admitting: Family Medicine

## 2020-07-09 ENCOUNTER — Telehealth: Payer: Self-pay | Admitting: Family Medicine

## 2020-07-09 ENCOUNTER — Other Ambulatory Visit: Payer: Self-pay

## 2020-07-09 VITALS — BP 129/82 | HR 73 | Temp 98.1°F | Ht 65.0 in | Wt 143.0 lb

## 2020-07-09 DIAGNOSIS — E785 Hyperlipidemia, unspecified: Secondary | ICD-10-CM | POA: Diagnosis not present

## 2020-07-09 DIAGNOSIS — R253 Fasciculation: Secondary | ICD-10-CM | POA: Diagnosis not present

## 2020-07-09 DIAGNOSIS — I10 Essential (primary) hypertension: Secondary | ICD-10-CM

## 2020-07-09 DIAGNOSIS — Z79891 Long term (current) use of opiate analgesic: Secondary | ICD-10-CM | POA: Diagnosis not present

## 2020-07-09 DIAGNOSIS — H60312 Diffuse otitis externa, left ear: Secondary | ICD-10-CM | POA: Diagnosis not present

## 2020-07-09 DIAGNOSIS — F4323 Adjustment disorder with mixed anxiety and depressed mood: Secondary | ICD-10-CM

## 2020-07-09 MED ORDER — CITALOPRAM HYDROBROMIDE 20 MG PO TABS: 20.0000 mg | ORAL_TABLET | Freq: Every day | ORAL | 3 refills | Status: DC

## 2020-07-09 MED ORDER — CIPROFLOXACIN-DEXAMETHASONE 0.3-0.1 % OT SUSP
4.0000 [drp] | Freq: Two times a day (BID) | OTIC | 0 refills | Status: DC
Start: 2020-07-09 — End: 2020-07-28

## 2020-07-09 MED ORDER — NEOMYCIN-POLYMYXIN-HC 3.5-10000-1 OT SOLN: 3.0000 [drp] | Freq: Four times a day (QID) | OTIC | 0 refills | Status: AC

## 2020-07-09 MED ORDER — LORAZEPAM 0.5 MG PO TABS: 0.5000 mg | ORAL_TABLET | Freq: Every day | ORAL | 2 refills | Status: DC | PRN

## 2020-07-09 NOTE — Telephone Encounter (Signed)
[facility] states that pts insurance will not cover the generic for ciprofloxacin-dexamethasone (CIPRODEX) OTIC suspension. Insurance will cover the name brand but the cost for the pt will be over $200. Pharmacy is wanting to see if the dr would like to change to something else.

## 2020-07-09 NOTE — Telephone Encounter (Signed)
Sent cortisporin

## 2020-07-09 NOTE — Progress Notes (Signed)
BP 129/82   Pulse 73   Temp 98.1 F (36.7 C)   Ht 5\' 5"  (1.651 m)   Wt 143 lb (64.9 kg)   SpO2 96%   BMI 23.80 kg/m    Subjective:   Patient ID: Jody Wilson, female    DOB: February 27, 1951, 69 y.o.   MRN: [id number]  HPI: Jody Wilson is a 69 y.o. female presenting on 07/09/2020 for Medical Management of Chronic Issues, Hypertension, and Hyperlipidemia   HPI Hyperlipidemia Patient is coming in for recheck of his hyperlipidemia. The patient is currently taking no medication currently we are monitoring, she has been diagnosed with fatty liver disease that we have held off on the medicine for now.. They deny any issues with myalgias or history of liver damage from it. They deny any focal numbness or weakness or chest pain.   Muscle twitching Current rx-lorazepam 0.5 mg daily as needed # meds rx-30 Effectiveness of current meds-works well, she does not use it every day, this is a recent start just a couple weeks ago Adverse reactions form meds-none  Pill count performed-No Last drug screen -09/18/2019 ( high risk q5m, moderate risk q86m, low risk yearly ) Urine drug screen today- Yes Was the [place] reviewed-yes  If yes were their any concerning findings? -None  No flowsheet data found.   Controlled substance contract signed on: Today  Hypertension Patient is currently on hydrochlorothiazide and olmesartan, and their blood pressure today is 129/82. Patient denies any lightheadedness or dizziness. Patient denies headaches, blurred vision, chest pains, shortness of breath, or weakness. Denies any side effects from medication and is content with current medication.   Patient also comes in complaining of left ear drainage and irritation and pruritus she has been having over the past couple weeks.  She is thought this before and has some drops in the past that she is but she does not have any currently.  The patient had a recent hospital visit for syncopal episodes of muscle  twitching and they started her on lorazepam instead of the alprazolam, she is only using as needed and does not use it every day and tries not to use it every day if she does not need it, she says mostly she has issues with it in the evenings if she does have issues.  Patient does have a vascular and vein appointment coming up next week.  During the hospitalization she was found to be dehydrated at the likely cause for her syncopal episode.  We will continue to monitor and she will make sure she stays hydrated well.  Relevant past medical, surgical, family and social history reviewed and updated as indicated. Interim medical history since our last visit reviewed. Allergies and medications reviewed and updated.  Review of Systems  Constitutional: Negative for chills and fever.  Eyes: Negative for visual disturbance.  Respiratory: Negative for chest tightness and shortness of breath.   Cardiovascular: Negative for chest pain and leg swelling.  Genitourinary: Negative for difficulty urinating and dysuria.  Musculoskeletal: Negative for back pain and gait problem.  Skin: Negative for rash.  Neurological: Negative for light-headedness and headaches.  Psychiatric/Behavioral: Negative for agitation and behavioral problems.  All other systems reviewed and are negative.   Per HPI unless specifically indicated above   Allergies as of 07/09/2020      Reactions   Tetracyclines & Related Itching, Swelling      Medication List       Accurate as of July 09, 2020  9:54 AM. If you have any questions, ask your nurse or doctor.        calcium-vitamin D 500-200 MG-UNIT tablet Commonly known as: OSCAL WITH D Take 1 tablet by mouth 2 (two) times daily.   ciprofloxacin-dexamethasone OTIC suspension Commonly known as: Ciprodex Place 4 drops into the left ear 2 (two) times daily. Started by: Worthy Rancher, MD   citalopram 20 MG tablet Commonly known as: CELEXA Take 1 tablet (20 mg total) by  mouth daily. What changed:   medication strength  how much to take Changed by: Worthy Rancher, MD   denosumab 60 MG/ML Sosy injection Commonly known as: PROLIA Inject 60 mg into the skin every 6 (six) months.   feeding supplement (ENSURE ENLIVE) Liqd Take 237 mLs by mouth 2 (two) times daily between meals.   hydrochlorothiazide 12.5 MG capsule Commonly known as: MICROZIDE Take 1 capsule (12.5 mg total) by mouth daily.   LORazepam 0.5 MG tablet Commonly known as: ATIVAN Take 1 tablet (0.5 mg total) by mouth daily as needed for anxiety. What changed: when to take this Changed by: Fransisca Kaufmann Aquanetta Schwarz, MD   olmesartan 40 MG tablet Commonly known as: BENICAR TAKE 1 TABLET DAILY   pantoprazole 40 MG tablet Commonly known as: PROTONIX Take 1 tablet (40 mg total) by mouth daily.   valACYclovir 1000 MG tablet Commonly known as: VALTREX Take 2 tablets (2,000 mg total) by mouth 2 (two) times daily. For 2 days What changed:   when to take this  reasons to take this  additional instructions   WOMENS 50+ MULTI VITAMIN/MIN PO Take 1 tablet by mouth daily.        Objective:   BP 129/82   Pulse 73   Temp 98.1 F (36.7 C)   Ht 5\' 5"  (1.651 m)   Wt 143 lb (64.9 kg)   SpO2 96%   BMI 23.80 kg/m   Wt Readings from Last 3 Encounters:  07/09/20 143 lb (64.9 kg)  06/26/20 143 lb 3.2 oz (65 kg)  06/24/20 145 lb 6.4 oz (66 kg)    Physical Exam Vitals and nursing note reviewed.  Constitutional:      General: She is not in acute distress.    Appearance: She is well-developed. She is not diaphoretic.  Eyes:     Conjunctiva/sclera: Conjunctivae normal.  Cardiovascular:     Rate and Rhythm: Normal rate and regular rhythm.     Heart sounds: Normal heart sounds. No murmur heard.   Pulmonary:     Effort: Pulmonary effort is normal. No respiratory distress.     Breath sounds: Normal breath sounds. No wheezing.  Musculoskeletal:        General: No tenderness. Normal  range of motion.  Skin:    General: Skin is warm and dry.     Findings: No rash.  Neurological:     Mental Status: She is alert and oriented to person, place, and time.     Coordination: Coordination normal.  Psychiatric:        Behavior: Behavior normal.       Assessment & Plan:   Problem List Items Addressed This Visit      Cardiovascular and Mediastinum   Hypertension     Other   Adjustment disorder with mixed anxiety and depressed mood   Relevant Medications   citalopram (CELEXA) 20 MG tablet   Hyperlipidemia with target LDL less than 100   Relevant Orders   Lipid panel  Other Visit Diagnoses    Muscle twitching    -  Primary   Acute diffuse otitis externa of left ear          Will continue lorazepam for the muscle twitching and help with anxiety, just as needed, no other change in medication.  She does have close follow-up with vascular and gastroenterology.  She will maintain hydration. Follow up plan: Return in about 3 months (around 10/09/2020), or if symptoms worsen or fail to improve, for Recheck anxiety and hyperlipidemia.  Counseling provided for all of the vaccine components Orders Placed This Encounter  Procedures  . Lipid panel    [name], MD [facility] 07/09/2020, 9:54 AM

## 2020-07-09 NOTE — Addendum Note (Signed)
Addended by: Alphonzo Dublin on: 07/09/2020 10:17 AM   Modules accepted: Orders

## 2020-07-09 NOTE — Patient Instructions (Signed)
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

## 2020-07-10 ENCOUNTER — Other Ambulatory Visit: Payer: Self-pay

## 2020-07-10 ENCOUNTER — Inpatient Hospital Stay (HOSPITAL_COMMUNITY): Payer: Medicare Other | Attending: Hematology

## 2020-07-10 ENCOUNTER — Other Ambulatory Visit (HOSPITAL_COMMUNITY)
Admission: RE | Admit: 2020-07-10 | Discharge: 2020-07-10 | Disposition: A | Discharge status: Home / Self Care | Payer: Medicare Other | Source: Ambulatory Visit | Attending: Family Medicine | Admitting: Family Medicine

## 2020-07-10 DIAGNOSIS — E785 Hyperlipidemia, unspecified: Secondary | ICD-10-CM | POA: Insufficient documentation

## 2020-07-10 DIAGNOSIS — D509 Iron deficiency anemia, unspecified: Secondary | ICD-10-CM | POA: Insufficient documentation

## 2020-07-10 DIAGNOSIS — E538 Deficiency of other specified B group vitamins: Secondary | ICD-10-CM | POA: Diagnosis not present

## 2020-07-10 LAB — CBC WITH DIFFERENTIAL/PLATELET
Abs Immature Granulocytes: 0.02 10*3/uL (ref 0.00–0.07)
Basophils Absolute: 0 10*3/uL (ref 0.0–0.1)
Basophils Relative: 0 %
Eosinophils Absolute: 0.1 10*3/uL (ref 0.0–0.5)
Eosinophils Relative: 1 %
HCT: 48.4 % — ABNORMAL HIGH (ref 36.0–46.0)
Hemoglobin: 16.5 g/dL — ABNORMAL HIGH (ref 12.0–15.0)
Immature Granulocytes: 0 %
Lymphocytes Relative: 29 %
Lymphs Abs: 2.1 10*3/uL (ref 0.7–4.0)
MCH: 36.1 pg — ABNORMAL HIGH (ref 26.0–34.0)
MCHC: 34.1 g/dL (ref 30.0–36.0)
MCV: 105.9 fL — ABNORMAL HIGH (ref 80.0–100.0)
Monocytes Absolute: 0.6 10*3/uL (ref 0.1–1.0)
Monocytes Relative: 8 %
Neutro Abs: 4.4 10*3/uL (ref 1.7–7.7)
Neutrophils Relative %: 62 %
Platelets: 244 10*3/uL (ref 150–400)
RBC: 4.57 MIL/uL (ref 3.87–5.11)
RDW: 11.7 % (ref 11.5–15.5)
WBC: 7.2 10*3/uL (ref 4.0–10.5)
nRBC: 0 % (ref 0.0–0.2)

## 2020-07-10 LAB — IRON AND TIBC
Iron: 194 ug/dL — ABNORMAL HIGH (ref 28–170)
Saturation Ratios: 55 % — ABNORMAL HIGH (ref 10.4–31.8)
TIBC: 353 ug/dL (ref 250–450)
UIBC: 159 ug/dL

## 2020-07-10 LAB — COMPREHENSIVE METABOLIC PANEL
ALT: 20 U/L (ref 0–44)
AST: 24 U/L (ref 15–41)
Albumin: 4 g/dL (ref 3.5–5.0)
Alkaline Phosphatase: 61 U/L (ref 38–126)
Anion gap: 10 (ref 5–15)
BUN: 17 mg/dL (ref 8–23)
CO2: 25 mmol/L (ref 22–32)
Calcium: 8.6 mg/dL — ABNORMAL LOW (ref 8.9–10.3)
Chloride: 102 mmol/L (ref 98–111)
Creatinine, Ser: 0.57 mg/dL (ref 0.44–1.00)
GFR calc Af Amer: >60 mL/min (ref >60)
GFR calc non Af Amer: >60 mL/min (ref >60)
Glucose, Bld: 108 mg/dL — ABNORMAL HIGH (ref 70–99)
Potassium: 4 mmol/L (ref 3.5–5.1)
Sodium: 137 mmol/L (ref 135–145)
Total Bilirubin: 1.2 mg/dL (ref 0.3–1.2)
Total Protein: 7 g/dL (ref 6.5–8.1)

## 2020-07-10 LAB — LIPID PANEL
Cholesterol: 229 mg/dL — ABNORMAL HIGH (ref 0–200)
HDL: 70 mg/dL (ref >40)
LDL Cholesterol: 126 mg/dL — ABNORMAL HIGH (ref 0–99)
Total CHOL/HDL Ratio: 3.3 RATIO
Triglycerides: 163 mg/dL — ABNORMAL HIGH (ref <150)
VLDL: 33 mg/dL (ref 0–40)

## 2020-07-10 LAB — VITAMIN B12: Vitamin B-12: 166 pg/mL — ABNORMAL LOW (ref 180–914)

## 2020-07-10 LAB — FERRITIN: Ferritin: 113 ng/mL (ref 11–307)

## 2020-07-11 ENCOUNTER — Other Ambulatory Visit: Payer: Self-pay

## 2020-07-11 ENCOUNTER — Emergency Department (HOSPITAL_COMMUNITY)
Admission: EM | Admit: 2020-07-11 | Discharge: 2020-07-12 | Disposition: A | Discharge status: Home / Self Care | Payer: Medicare Other | Attending: Emergency Medicine | Admitting: Emergency Medicine

## 2020-07-11 ENCOUNTER — Other Ambulatory Visit: Payer: Self-pay | Admitting: *Deleted

## 2020-07-11 DIAGNOSIS — F10929 Alcohol use, unspecified with intoxication, unspecified: Secondary | ICD-10-CM | POA: Diagnosis present

## 2020-07-11 DIAGNOSIS — F10920 Alcohol use, unspecified with intoxication, uncomplicated: Secondary | ICD-10-CM

## 2020-07-11 DIAGNOSIS — K219 Gastro-esophageal reflux disease without esophagitis: Secondary | ICD-10-CM | POA: Insufficient documentation

## 2020-07-11 DIAGNOSIS — F17210 Nicotine dependence, cigarettes, uncomplicated: Secondary | ICD-10-CM | POA: Diagnosis not present

## 2020-07-11 DIAGNOSIS — I10 Essential (primary) hypertension: Secondary | ICD-10-CM | POA: Diagnosis not present

## 2020-07-11 DIAGNOSIS — F329 Major depressive disorder, single episode, unspecified: Secondary | ICD-10-CM | POA: Insufficient documentation

## 2020-07-11 DIAGNOSIS — R531 Weakness: Secondary | ICD-10-CM | POA: Insufficient documentation

## 2020-07-11 DIAGNOSIS — Z79899 Other long term (current) drug therapy: Secondary | ICD-10-CM | POA: Diagnosis not present

## 2020-07-11 DIAGNOSIS — M810 Age-related osteoporosis without current pathological fracture: Secondary | ICD-10-CM | POA: Insufficient documentation

## 2020-07-11 DIAGNOSIS — E785 Hyperlipidemia, unspecified: Secondary | ICD-10-CM | POA: Insufficient documentation

## 2020-07-11 DIAGNOSIS — F419 Anxiety disorder, unspecified: Secondary | ICD-10-CM | POA: Insufficient documentation

## 2020-07-11 DIAGNOSIS — Y907 Blood alcohol level of 200-239 mg/100 ml: Secondary | ICD-10-CM | POA: Insufficient documentation

## 2020-07-11 DIAGNOSIS — F10129 Alcohol abuse with intoxication, unspecified: Secondary | ICD-10-CM | POA: Insufficient documentation

## 2020-07-11 LAB — URINALYSIS, ROUTINE W REFLEX MICROSCOPIC
Bilirubin Urine: NEGATIVE
Glucose, UA: NEGATIVE mg/dL
Hgb urine dipstick: NEGATIVE
Ketones, ur: NEGATIVE mg/dL
Leukocytes,Ua: NEGATIVE
Nitrite: NEGATIVE
Protein, ur: NEGATIVE mg/dL
Specific Gravity, Urine: 1.008 (ref 1.005–1.030)
pH: 6 (ref 5.0–8.0)

## 2020-07-11 LAB — CBC WITH DIFFERENTIAL/PLATELET
Abs Immature Granulocytes: 0.02 10*3/uL (ref 0.00–0.07)
Basophils Absolute: 0 10*3/uL (ref 0.0–0.1)
Basophils Relative: 0 %
Eosinophils Absolute: 0.1 10*3/uL (ref 0.0–0.5)
Eosinophils Relative: 2 %
HCT: 46.4 % — ABNORMAL HIGH (ref 36.0–46.0)
Hemoglobin: 16 g/dL — ABNORMAL HIGH (ref 12.0–15.0)
Immature Granulocytes: 0 %
Lymphocytes Relative: 35 %
Lymphs Abs: 2.9 10*3/uL (ref 0.7–4.0)
MCH: 36 pg — ABNORMAL HIGH (ref 26.0–34.0)
MCHC: 34.5 g/dL (ref 30.0–36.0)
MCV: 104.3 fL — ABNORMAL HIGH (ref 80.0–100.0)
Monocytes Absolute: 0.5 10*3/uL (ref 0.1–1.0)
Monocytes Relative: 6 %
Neutro Abs: 4.6 10*3/uL (ref 1.7–7.7)
Neutrophils Relative %: 57 %
Platelets: 265 10*3/uL (ref 150–400)
RBC: 4.45 MIL/uL (ref 3.87–5.11)
RDW: 11.6 % (ref 11.5–15.5)
WBC: 8.2 10*3/uL (ref 4.0–10.5)
nRBC: 0 % (ref 0.0–0.2)

## 2020-07-11 LAB — ACETAMINOPHEN LEVEL: Acetaminophen (Tylenol), Serum: <10 ug/mL — ABNORMAL LOW (ref 10–30)

## 2020-07-11 LAB — COMPREHENSIVE METABOLIC PANEL
ALT: 22 U/L (ref 0–44)
AST: 31 U/L (ref 15–41)
Albumin: 3.7 g/dL (ref 3.5–5.0)
Alkaline Phosphatase: 56 U/L (ref 38–126)
Anion gap: 11 (ref 5–15)
BUN: 16 mg/dL (ref 8–23)
CO2: 23 mmol/L (ref 22–32)
Calcium: 9 mg/dL (ref 8.9–10.3)
Chloride: 103 mmol/L (ref 98–111)
Creatinine, Ser: 0.61 mg/dL (ref 0.44–1.00)
GFR calc Af Amer: >60 mL/min (ref >60)
GFR calc non Af Amer: >60 mL/min (ref >60)
Glucose, Bld: 95 mg/dL (ref 70–99)
Potassium: 3.5 mmol/L (ref 3.5–5.1)
Sodium: 137 mmol/L (ref 135–145)
Total Bilirubin: 0.5 mg/dL (ref 0.3–1.2)
Total Protein: 6.9 g/dL (ref 6.5–8.1)

## 2020-07-11 LAB — RAPID URINE DRUG SCREEN, HOSP PERFORMED
Amphetamines: NOT DETECTED
Barbiturates: NOT DETECTED
Benzodiazepines: POSITIVE — AB
Cocaine: NOT DETECTED
Opiates: NOT DETECTED
Tetrahydrocannabinol: NOT DETECTED

## 2020-07-11 LAB — MAGNESIUM: Magnesium: 2 mg/dL (ref 1.7–2.4)

## 2020-07-11 LAB — ETHANOL: Alcohol, Ethyl (B): 203 mg/dL — ABNORMAL HIGH (ref <10)

## 2020-07-11 LAB — SALICYLATE LEVEL: Salicylate Lvl: <7 mg/dL — ABNORMAL LOW (ref 7.0–30.0)

## 2020-07-11 LAB — ERYTHROPOIETIN: Erythropoietin: 9.2 m[IU]/mL (ref 2.6–18.5)

## 2020-07-11 MED ORDER — NALOXONE HCL 0.4 MG/ML IJ SOLN
0.4000 mg | Freq: Once | INTRAMUSCULAR | Status: AC
  Administered 2020-07-11: 0.4 mg via INTRAVENOUS
  Filled 2020-07-11: qty 1

## 2020-07-11 MED ORDER — PRAVASTATIN SODIUM 20 MG PO TABS
20.0000 mg | ORAL_TABLET | Freq: Every day | ORAL | 1 refills | Status: DC
Start: 2020-07-11 — End: 2021-03-24

## 2020-07-11 NOTE — Discharge Instructions (Addendum)
Please reduce the amount of alcohol you drink You can die if you drink alcohol and take lorazepam Please do not drink while taking this medication Talk to your doctor this week for repeat evaluation

## 2020-07-11 NOTE — ED Provider Notes (Signed)
[facility] EMERGENCY DEPARTMENT Provider Note   CSN: [id number] Arrival date & time: 07/11/20  2050     History Chief Complaint  Patient presents with   Alcohol Intoxication    Jody Wilson is a 69 y.o. female.  HPI   This patient is a 69 year old female, she has a known history of anxiety and depression, she is currently on lorazepam, she is followed closely in her family practice office by Dr. Warrick Parisian, she takes medicines for hypertension, hyperlipidemia and acid reflux as well.  She had had a recent admission to the hospital couple weeks ago for syncope, this was thought to be related to dehydration at the time.  The call went out to the paramedics this evening for unresponsiveness.  The paramedics found the patient to be generally weak, having some eye twitching, not very interactive with the paramedics, there was an endorsement of some alcohol use, no definite seizure activity, no vomiting or diarrhea.  The patient is able to wake up and tell me that she has felt weak, she does not give me any other significant details.  She appears to be somnolent and having a difficult time staying awake.  Vital signs have been normal prehospital by paramedics.  Level 5 caveat applies  Past Medical History:  Diagnosis Date   Anemia    Anxiety    Depression    GERD (gastroesophageal reflux disease)    Hyperlipidemia    Hypertension    Osteoporosis     Patient Active Problem List   Diagnosis Date Noted   Anxiety and depression 06/23/2020   Hiatal hernia 06/23/2020   Thoracic aortic atherosclerosis ([place]) 06/23/2020   Syncope 06/22/2020   Tobacco abuse 04/25/2020   Involuntary muscle jerks while sleeping 09/26/2019   B12 deficiency 09/25/2019   Iron deficiency anemia 09/25/2019   Elevated liver function tests 09/07/2019   Anemia 05/18/2018   Osteoporosis 02/09/2017   History of vertebral fracture 02/09/2017   GERD (gastroesophageal reflux disease)  03/11/2016   Hyperlipidemia with target LDL less than 100 02/19/2015   Adjustment disorder with mixed anxiety and depressed mood 03/18/2014   Hypertension 03/18/2014    Past Surgical History:  Procedure Laterality Date   ABDOMINAL HYSTERECTOMY     BACK SURGERY     CHOLECYSTECTOMY     FOOT SURGERY     7 in the past   IR GENERIC HISTORICAL  12/09/2016   IR RADIOLOGIST EVAL & MGMT 12/09/2016 [facility]   IR GENERIC HISTORICAL  12/10/2016   IR VERTEBROPLASTY CERV/THOR BX INC UNI/BIL INC/INJECT/IMAGING 12/10/2016 [facility]   TRACHEOSTOMY TUBE PLACEMENT  1982     OB History   No obstetric history on file.     Family History  Problem Relation Age of Onset   Heart disease Mother    Hypertension Mother    Hyperlipidemia Mother    Diabetes Mother    Congestive Heart Failure Mother    Hypertension Son    Other Son 63       Kienbock's Disease   Liver disease Son 84       Gilbert's Disease   Aneurysm Paternal Uncle    Heart attack Maternal Grandmother    Heart attack Maternal Grandfather    Lupus Half-Sister     Social History   Tobacco Use   Smoking status: Current Every Day Smoker    Packs/day: 0.25    Types: Cigarettes   Smokeless tobacco: Never Used   Tobacco comment: Trying to cut on  number of cigarettes she smokes.  Vaping Use   Vaping Use: Never used  Substance Use Topics   Alcohol use: Yes    Comment: occassionally    Drug use: No    Home Medications Prior to Admission medications   Medication Sig Start Date End Date Taking? Authorizing Provider  calcium-vitamin D (OSCAL WITH D) 500-200 MG-UNIT tablet Take 1 tablet by mouth 2 (two) times daily.    Yes [provider]  citalopram (CELEXA) 20 MG tablet Take 1 tablet (20 mg total) by mouth daily. 07/09/20  Yes Dettinger, Fransisca Kaufmann, MD  denosumab (PROLIA) 60 MG/ML SOSY injection Inject 60 mg into the skin every 6 (six) months. 07/26/18  Yes Dettinger, Fransisca Kaufmann, MD    feeding supplement, ENSURE ENLIVE, (ENSURE ENLIVE) LIQD Take 237 mLs by mouth 2 (two) times daily between meals. 06/24/20  Yes Hosie Poisson, MD  hydrochlorothiazide (MICROZIDE) 12.5 MG capsule Take 1 capsule (12.5 mg total) by mouth daily. 04/04/20  Yes Dettinger, Fransisca Kaufmann, MD  LORazepam (ATIVAN) 0.5 MG tablet Take 1 tablet (0.5 mg total) by mouth daily as needed for anxiety. 07/09/20  Yes Dettinger, Fransisca Kaufmann, MD  Multiple Vitamins-Minerals (WOMENS 50+ [place] VITAMIN/MIN PO) Take 1 tablet by mouth daily.    Yes [provider]  neomycin-polymyxin-hydrocortisone (CORTISPORIN) OTIC solution Place 3 drops into the left ear 4 (four) times daily for 7 days. 07/09/20 07/16/20 Yes Dettinger, Fransisca Kaufmann, MD  olmesartan (BENICAR) 40 MG tablet TAKE 1 TABLET DAILY Patient taking differently: Take 40 mg by mouth daily.  06/24/20  Yes Dettinger, Fransisca Kaufmann, MD  pantoprazole (PROTONIX) 40 MG tablet Take 1 tablet (40 mg total) by mouth daily. 01/04/20  Yes Dettinger, Fransisca Kaufmann, MD  valACYclovir (VALTREX) 1000 MG tablet Take 2 tablets (2,000 mg total) by mouth 2 (two) times daily. For 2 days Patient taking differently: Take 2,000 mg by mouth 2 (two) times daily as needed (Fever blisters).  12/24/19  Yes Stacks, Cletus Gash, MD  ciprofloxacin-dexamethasone (CIPRODEX) OTIC suspension Place 4 drops into the left ear 2 (two) times daily. Patient not taking: Reported on 07/11/2020 07/09/20   Dettinger, Fransisca Kaufmann, MD  pravastatin (PRAVACHOL) 20 MG tablet Take 1 tablet (20 mg total) by mouth daily. 07/11/20   Dettinger, Fransisca Kaufmann, MD    Allergies    Tetracyclines & related  Review of Systems   Review of Systems  Unable to perform ROS: Mental status change    Physical Exam Updated Vital Signs BP (!) 95/64    Pulse 64    Temp 98 F (36.7 C) (Oral)    Resp 14    Ht 1.651 m (5\' 5" )    Wt 64.9 kg    SpO2 98%    BMI 23.81 kg/m   Physical Exam Vitals and nursing note reviewed.  Constitutional:      General: She is not in acute  distress.    Appearance: She is well-developed.     Comments: Somnolent but arousable  HENT:     Head: Normocephalic and atraumatic.     Mouth/Throat:     Mouth: Mucous membranes are moist.     Pharynx: No oropharyngeal exudate.  Eyes:     General: No scleral icterus.       Right eye: No discharge.        Left eye: No discharge.     Conjunctiva/sclera: Conjunctivae normal.     Pupils: Pupils are equal, round, and reactive to light.  Neck:  Thyroid: No thyromegaly.     Vascular: No JVD.  Cardiovascular:     Rate and Rhythm: Normal rate and regular rhythm.     Heart sounds: Normal heart sounds. No murmur heard.  No friction rub. No gallop.   Pulmonary:     Effort: Pulmonary effort is normal. No respiratory distress.     Breath sounds: Normal breath sounds. No wheezing or rales.  Abdominal:     General: Bowel sounds are normal. There is no distension.     Palpations: Abdomen is soft. There is no mass.     Tenderness: There is no abdominal tenderness.  Musculoskeletal:        General: No tenderness. Normal range of motion.     Cervical back: Normal range of motion and neck supple.  Lymphadenopathy:     Cervical: No cervical adenopathy.  Skin:    General: Skin is warm and dry.     Findings: No erythema or rash.  Neurological:     Coordination: Coordination normal.     Comments: The patient is generally weak, when she sits up which she does by herself when I ask she closes her eyes and tilts to the side falling back towards the bed after a short time.  When she wakes back up she is able to move all 4 extremities with apparent normal strength but appears generally clumsy.  Psychiatric:        Behavior: Behavior normal.     ED Results / Procedures / Treatments   Labs (all labs ordered are listed, but only abnormal results are displayed) Labs Reviewed  CBC WITH DIFFERENTIAL/PLATELET - Abnormal; Notable for the following components:      Result Value   Hemoglobin 16.0 (*)      HCT 46.4 (*)    MCV 104.3 (*)    MCH 36.0 (*)    All other components within normal limits  ETHANOL - Abnormal; Notable for the following components:   Alcohol, Ethyl (B) 203 (*)    All other components within normal limits  RAPID URINE DRUG SCREEN, HOSP PERFORMED - Abnormal; Notable for the following components:   Benzodiazepines POSITIVE (*)    All other components within normal limits  ACETAMINOPHEN LEVEL - Abnormal; Notable for the following components:   Acetaminophen (Tylenol), Serum <10 (*)    All other components within normal limits  SALICYLATE LEVEL - Abnormal; Notable for the following components:   Salicylate Lvl <4.8 (*)    All other components within normal limits  COMPREHENSIVE METABOLIC PANEL  URINALYSIS, ROUTINE W REFLEX MICROSCOPIC  MAGNESIUM    EKG EKG Interpretation  Date/Time:  Friday July 11 2020 21:01:49 EDT Ventricular Rate:  58 PR Interval:    QRS Duration: 80 QT Interval:  433 QTC Calculation: 426 R Axis:   72 Text Interpretation: Sinus rhythm Borderline short PR interval Low voltage, precordial leads Since last tracing rate slower Confirmed by Noemi Chapel [phone]) on 07/11/2020 9:11:00 PM   Radiology No results found.  Procedures Procedures (including critical care time)  Medications Ordered in ED Medications  naloxone (NARCAN) injection 0.4 mg (0.4 mg Intravenous Given 07/11/20 2133)    ED Course  I have reviewed the triage vital signs and the nursing notes.  Pertinent labs & imaging results that were available during my care of the patient were reviewed by me and considered in my medical decision making (see chart for details).    MDM Rules/Calculators/A&P  The patient's EKG is unremarkable, there is no signs of ischemia or arrhythmia.  She will need further testing with drug screen, alcohol level, electrolytes, she may need some hydration.  She has no headache and does not seem focal, more somnolent.  Labs  reveal a normal urinalysis, drug screen positive for benzodiazepines, blood counts which are unremarkable and consistent over time, metabolic panel which was normal as well as magnesium.  She is alcohol intoxicated with a level over 200.  This is essentially 3-1/2 times the legal limit.  Sent message to PCP regarding the patient's history of alcohol use in the presence of benzodiazepines in her presentations with altered mental status.  At this time she is a little bit more awake but will need to sober up prior to being safely discharged home  She wants to leave at this time but I do not think she is medically stable given her intermittent somnolence, plan on observation in the ED until more sober.  Change of shift - care signed out to Dr. Betsey Holiday with above plan  Final Clinical Impression(s) / ED Diagnoses Final diagnoses:  Alcoholic intoxication without complication ([place])      Noemi Chapel, MD 07/11/20 2325

## 2020-07-11 NOTE — ED Notes (Signed)
MD notified that pt made a graceful descend into the floor on purpose after being asked for help to the restroom. PT placed back into bed and purewick put onto pt.

## 2020-07-11 NOTE — ED Triage Notes (Addendum)
Pt called for possible dehydration. Pt has been drinking unknown amount Has been passing out on and off 2 weeks. [facility] to [facility] to be [facility] this. "passed out" with ems. Pt held eyelids closed when ems attempted to open them.  Pt states she has had 2 shots vodka with orange juice  Supposed to see cardiology per EMS  Able to arouse pt in tx room

## 2020-07-12 LAB — TOXASSURE SELECT 13 (MW), URINE

## 2020-07-12 NOTE — ED Provider Notes (Signed)
Patient signed out to me by Dr. Sabra Heck.  She has been monitored through the night.  Patient has done well.  She has now awake and alert, appropriate for discharge.   Orpah Greek, MD 07/12/20 [phone]

## 2020-07-15 ENCOUNTER — Encounter: Payer: Self-pay | Admitting: Vascular Surgery

## 2020-07-15 ENCOUNTER — Ambulatory Visit (INDEPENDENT_AMBULATORY_CARE_PROVIDER_SITE_OTHER): Payer: Medicare Other | Admitting: Vascular Surgery

## 2020-07-15 ENCOUNTER — Other Ambulatory Visit: Payer: Self-pay

## 2020-07-15 VITALS — BP 110/70 | HR 66 | Temp 97.7°F | Resp 18 | Ht 64.5 in | Wt 141.3 lb

## 2020-07-15 DIAGNOSIS — I712 Thoracic aortic aneurysm, without rupture, unspecified: Secondary | ICD-10-CM

## 2020-07-15 NOTE — Progress Notes (Signed)
[facility] [place]  Patient name: Jody Wilson MRN: [id number] DOB: [birth year] Sex: female  REASON FOR CONSULT: Discuss incidental finding on CT and MRI of chest  HPI: Jody Wilson is a 69 y.o. 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  She had initially undergone low-dose CT for screening of lung cancer which was negative.  This showed indeterminate liver lesion and it was recommended that she undergo an MRI of her abdomen for further evaluation.  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  This was not commented upon on her low-dose CT of her chest but was present.  She is seeing me today for further discussion of this.  She has no prior history of peripheral vascular occlusive disease.  She is a cigarette smoker.  Past Medical History:  Diagnosis Date  . Anemia   . Anxiety   . Depression   . GERD (gastroesophageal reflux disease)   . Hyperlipidemia   . Hypertension   . Osteoporosis     Family History  Problem Relation Age of Onset  . Heart disease Mother   . Hypertension Mother   . Hyperlipidemia Mother   . Diabetes Mother   . Congestive Heart Failure Mother   . Hypertension Son   . Other Son 37       Kienbock's Disease  . Liver disease Son 12       Gilbert's Disease  . Aneurysm Paternal Uncle   . Heart attack Maternal Grandmother   . Heart attack Maternal Grandfather   . Lupus Half-Sister     SOCIAL HISTORY: Social History   Socioeconomic History  . Marital status: Married    Spouse name: Donnie  . Number of children: 3  . Years of education: 52  . Highest education level: Associate degree: occupational, Hotel manager, or vocational program  Occupational History  . 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    Tobacco Use  . Smoking status: Current Every Day Smoker    Packs/day: 0.25    Types: Cigarettes  . Smokeless tobacco: Never Used  . Tobacco comment: Trying to cut on number of cigarettes she smokes.  Vaping Use  . Vaping Use: Never used  Substance and Sexual Activity  . Alcohol use: Yes    Comment: occassionally   . Drug use: No  . Sexual activity: Not on file  Other Topics Concern  . Not on file  Social History Narrative   Lives with her husband, Jody Wilson, and has two grown sons and two grand sons.   She visits family and has a once weekly outing with a friend for socializing.  She enjoys making beaded jewelry, reading and walking..   Social Determinants of Health   Financial Resource Strain: Low Risk   . Difficulty of Paying Living Expenses: Not hard at all  Food Insecurity: No Food Insecurity  . Worried About Charity fundraiser in the Last Year: Never true  . Ran Out of Food in the Last Year: Never true  Transportation Needs: No Transportation Needs  . Lack of Transportation (Medical): No  . Lack of Transportation (Non-Medical): No  Physical Activity: Sufficiently Active  . Days of Exercise per Week: 5 days  . Minutes of Exercise per Session: 60 min  Stress:   . Feeling  of Stress :   Social Connections: Socially Integrated  . Frequency of Communication with Friends and Family: Twice a week  . Frequency of Social Gatherings with Friends and Family: Once a week  . Attends Religious Services: More than 4 times per year  . Active Member of Clubs or Organizations: Yes  . Attends Archivist Meetings: More than 4 times per year  . Marital Status: Married  Human resources officer Violence: Not At Risk  . Fear of Current or Ex-Partner: No  . Emotionally Abused: No  . Physically Abused: No  . Sexually Abused: No    Allergies  Allergen Reactions  . Tetracyclines & Related Itching and Swelling    Current Outpatient Medications  Medication Sig Dispense Refill  .  calcium-vitamin D (OSCAL WITH D) 500-200 MG-UNIT tablet Take 1 tablet by mouth 2 (two) times daily.     . ciprofloxacin-dexamethasone (CIPRODEX) OTIC suspension Place 4 drops into the left ear 2 (two) times daily. 7.5 mL 0  . citalopram (CELEXA) 20 MG tablet Take 1 tablet (20 mg total) by mouth daily. 90 tablet 3  . denosumab (PROLIA) 60 MG/ML SOSY injection Inject 60 mg into the skin every 6 (six) months. 2 mL 0  . feeding supplement, ENSURE ENLIVE, (ENSURE ENLIVE) LIQD Take 237 mLs by mouth 2 (two) times daily between meals. 237 mL 12  . hydrochlorothiazide (MICROZIDE) 12.5 MG capsule Take 1 capsule (12.5 mg total) by mouth daily. 90 capsule 3  . LORazepam (ATIVAN) 0.5 MG tablet Take 1 tablet (0.5 mg total) by mouth daily as needed for anxiety. 30 tablet 2  . Multiple Vitamins-Minerals (WOMENS 50+ MULTI VITAMIN/MIN PO) Take 1 tablet by mouth daily.     Marland Kitchen neomycin-polymyxin-hydrocortisone (CORTISPORIN) OTIC solution Place 3 drops into the left ear 4 (four) times daily for 7 days. 4.2 mL 0  . olmesartan (BENICAR) 40 MG tablet TAKE 1 TABLET DAILY (Patient taking differently: Take 40 mg by mouth daily. ) 90 tablet 0  . pantoprazole (PROTONIX) 40 MG tablet Take 1 tablet (40 mg total) by mouth daily. 90 tablet 3  . pravastatin (PRAVACHOL) 20 MG tablet Take 1 tablet (20 mg total) by mouth daily. 90 tablet 1  . valACYclovir (VALTREX) 1000 MG tablet Take 2 tablets (2,000 mg total) by mouth 2 (two) times daily. For 2 days (Patient taking differently: Take 2,000 mg by mouth 2 (two) times daily as needed (Fever blisters). ) 8 tablet 10   Current Facility-Administered Medications  Medication Dose Route Frequency Provider Last Rate Last Admin  . denosumab (PROLIA) injection 60 mg  60 mg Subcutaneous Q6 months Dettinger, Fransisca Kaufmann, MD   60 mg at 11/14/19 1553    REVIEW OF SYSTEMS:  [X]  denotes positive finding, [ ]  denotes negative finding Cardiac  Comments:  Chest pain or chest pressure:    Shortness of  breath upon exertion: x   Short of breath when lying flat:    Irregular heart rhythm:        Vascular    Pain in calf, thigh, or hip brought on by ambulation:    Pain in feet at night that wakes you up from your sleep:     Blood clot in your veins:    Leg swelling:         Pulmonary    Oxygen at home:    Productive cough:     Wheezing:         Neurologic    Sudden weakness in arms  or legs:     Sudden numbness in arms or legs:     Sudden onset of difficulty speaking or slurred speech: x   Temporary loss of vision in one eye:     Problems with dizziness:  x       Gastrointestinal    Blood in stool:     Vomited blood:         Genitourinary    Burning when urinating:     Blood in urine:        Psychiatric    Major depression:         Hematologic    Bleeding problems:    Problems with blood clotting too easily:        Skin    Rashes or ulcers:        Constitutional    Fever or chills:      PHYSICAL EXAM: Vitals:   07/15/20 0959  BP: 110/70  Pulse: 66  Resp: 18  Temp: 97.7 F (36.5 C)  TempSrc: Temporal  SpO2: 96%  Weight: 141 lb 4.8 oz (64.1 kg)  Height: 5' 4.5" (1.638 m)    GENERAL: The patient is a well-nourished female, in no acute distress. The vital signs are documented above. CARDIOVASCULAR: Carotid arteries without bruits bilaterally.  2+ radial and 2+ dorsalis pedis pulses PULMONARY: There is good air exchange  ABDOMEN: Soft and non-tender  MUSCULOSKELETAL: There are no major deformities or cyanosis. NEUROLOGIC: No focal weakness or paresthesias are detected. SKIN: There are no ulcers or rashes noted. PSYCHIATRIC: The patient has a normal affect.  DATA:  I reviewed her MRI and CT images with the patient.  She does have a small less than 1 cm outpouching within the inner curve of the arch of the aorta.  She does have some calcification of her aorta as well.  MEDICAL ISSUES: I had a long discussion with patient regarding this.  Explained that  this does not put her at any immediate risk for rupture.  I did explain the concern that this could grow over time.  Explained this is a very mild abnormality in her aorta currently and in all likelihood will not progress.  I did explain the critical importance of smoking cessation to slow her atherosclerotic progression.  I have recommended a CT scan in 2 years of her chest for follow-up.   Rosetta Posner, MD FACS [facility] Tel [phone] Pager [phone]

## 2020-07-16 ENCOUNTER — Ambulatory Visit (HOSPITAL_COMMUNITY): Payer: Medicare Other | Admitting: Hematology

## 2020-07-17 DIAGNOSIS — D509 Iron deficiency anemia, unspecified: Secondary | ICD-10-CM | POA: Diagnosis not present

## 2020-07-17 DIAGNOSIS — R197 Diarrhea, unspecified: Secondary | ICD-10-CM | POA: Diagnosis not present

## 2020-07-17 DIAGNOSIS — D122 Benign neoplasm of ascending colon: Secondary | ICD-10-CM | POA: Diagnosis not present

## 2020-07-17 DIAGNOSIS — K2970 Gastritis, unspecified, without bleeding: Secondary | ICD-10-CM | POA: Diagnosis not present

## 2020-07-17 DIAGNOSIS — K648 Other hemorrhoids: Secondary | ICD-10-CM | POA: Diagnosis not present

## 2020-07-17 DIAGNOSIS — K449 Diaphragmatic hernia without obstruction or gangrene: Secondary | ICD-10-CM | POA: Diagnosis not present

## 2020-07-17 DIAGNOSIS — R634 Abnormal weight loss: Secondary | ICD-10-CM | POA: Diagnosis not present

## 2020-07-22 DIAGNOSIS — D122 Benign neoplasm of ascending colon: Secondary | ICD-10-CM | POA: Diagnosis not present

## 2020-07-28 ENCOUNTER — Inpatient Hospital Stay (HOSPITAL_COMMUNITY): Payer: Medicare Other | Attending: Hematology | Admitting: Hematology

## 2020-07-28 ENCOUNTER — Other Ambulatory Visit: Payer: Self-pay

## 2020-07-28 VITALS — BP 151/82 | HR 54 | Temp 97.1°F | Resp 18 | Wt 144.4 lb

## 2020-07-28 DIAGNOSIS — E538 Deficiency of other specified B group vitamins: Secondary | ICD-10-CM

## 2020-07-28 DIAGNOSIS — Z833 Family history of diabetes mellitus: Secondary | ICD-10-CM | POA: Diagnosis not present

## 2020-07-28 DIAGNOSIS — D509 Iron deficiency anemia, unspecified: Secondary | ICD-10-CM | POA: Diagnosis not present

## 2020-07-28 DIAGNOSIS — F10129 Alcohol abuse with intoxication, unspecified: Secondary | ICD-10-CM | POA: Diagnosis not present

## 2020-07-28 DIAGNOSIS — M810 Age-related osteoporosis without current pathological fracture: Secondary | ICD-10-CM | POA: Diagnosis not present

## 2020-07-28 DIAGNOSIS — F329 Major depressive disorder, single episode, unspecified: Secondary | ICD-10-CM | POA: Insufficient documentation

## 2020-07-28 DIAGNOSIS — Z8349 Family history of other endocrine, nutritional and metabolic diseases: Secondary | ICD-10-CM | POA: Insufficient documentation

## 2020-07-28 DIAGNOSIS — R197 Diarrhea, unspecified: Secondary | ICD-10-CM | POA: Insufficient documentation

## 2020-07-28 DIAGNOSIS — R5383 Other fatigue: Secondary | ICD-10-CM | POA: Insufficient documentation

## 2020-07-28 DIAGNOSIS — Z9049 Acquired absence of other specified parts of digestive tract: Secondary | ICD-10-CM | POA: Insufficient documentation

## 2020-07-28 DIAGNOSIS — F17210 Nicotine dependence, cigarettes, uncomplicated: Secondary | ICD-10-CM | POA: Diagnosis not present

## 2020-07-28 DIAGNOSIS — R519 Headache, unspecified: Secondary | ICD-10-CM | POA: Insufficient documentation

## 2020-07-28 DIAGNOSIS — Z79899 Other long term (current) drug therapy: Secondary | ICD-10-CM | POA: Insufficient documentation

## 2020-07-28 DIAGNOSIS — Z8249 Family history of ischemic heart disease and other diseases of the circulatory system: Secondary | ICD-10-CM | POA: Diagnosis not present

## 2020-07-28 DIAGNOSIS — I10 Essential (primary) hypertension: Secondary | ICD-10-CM | POA: Diagnosis not present

## 2020-07-28 DIAGNOSIS — F419 Anxiety disorder, unspecified: Secondary | ICD-10-CM | POA: Diagnosis not present

## 2020-07-28 DIAGNOSIS — E785 Hyperlipidemia, unspecified: Secondary | ICD-10-CM | POA: Insufficient documentation

## 2020-07-28 DIAGNOSIS — D649 Anemia, unspecified: Secondary | ICD-10-CM | POA: Diagnosis not present

## 2020-07-28 DIAGNOSIS — Z8379 Family history of other diseases of the digestive system: Secondary | ICD-10-CM | POA: Insufficient documentation

## 2020-07-28 DIAGNOSIS — K219 Gastro-esophageal reflux disease without esophagitis: Secondary | ICD-10-CM | POA: Diagnosis not present

## 2020-07-28 MED ORDER — CYANOCOBALAMIN 1000 MCG/ML IJ SOLN
1000.0000 ug | Freq: Once | INTRAMUSCULAR | Status: AC
  Administered 2020-07-28: 1000 ug via INTRAMUSCULAR

## 2020-07-28 MED ORDER — CYANOCOBALAMIN 1000 MCG/ML IJ SOLN
INTRAMUSCULAR | Status: AC
  Filled 2020-07-28: qty 1

## 2020-07-28 MED ORDER — CYANOCOBALAMIN 1000 MCG/ML IJ SOLN: 1000.0000 ug | Freq: Once | INTRAMUSCULAR | 12 refills | Status: AC

## 2020-07-28 NOTE — Progress Notes (Signed)
[address], [place] [address]   CLINIC:  Medical Oncology/Hematology  PCP:  Dettinger, Fransisca Kaufmann, MD [place] / [place] [address]  [phone]  REASON FOR VISIT:  Follow-up for normocytic anemia  PRIOR THERAPY: None  CURRENT THERAPY: Intermittent Feraheme, daily V25 and folic acid supplements  INTERVAL HISTORY:  [name], a 69 y.o. female, returns for routine follow-up for her normocytic anemia. Jody Wilson was last seen on 04/09/2020.  She was taken to [place] on 7/23 for unresponsiveness due to alcohol intoxication. Today she reports having a colonoscopy and EGD the week of 8/2 at [place] and she reports that a few polyps were removed for pathology; no results have returned yet. She has been taking vitamin B12 daily.  She will see her cardiologist on 8/10.   REVIEW OF SYSTEMS:  Review of Systems  Constitutional: Positive for appetite change (moderately decreased) and fatigue (severe).  Gastrointestinal: Positive for diarrhea.  Neurological: Positive for headaches.  All other systems reviewed and are negative.   PAST MEDICAL/SURGICAL HISTORY:  Past Medical History:  Diagnosis Date  . Anemia   . Anxiety   . Depression   . GERD (gastroesophageal reflux disease)   . Hyperlipidemia   . Hypertension   . Osteoporosis    Past Surgical History:  Procedure Laterality Date  . ABDOMINAL HYSTERECTOMY    . BACK SURGERY    . CHOLECYSTECTOMY    . FOOT SURGERY     7 in the past  . IR GENERIC HISTORICAL  12/09/2016   IR RADIOLOGIST EVAL & MGMT 12/09/2016 [facility]  . IR GENERIC HISTORICAL  12/10/2016   IR VERTEBROPLASTY CERV/THOR BX INC UNI/BIL INC/INJECT/IMAGING 12/10/2016 [facility]  . TRACHEOSTOMY TUBE PLACEMENT  1982    SOCIAL HISTORY:  Social History   Socioeconomic History  . Marital status: Married    Spouse name: Jody Wilson  . Number of children: 3  . Years of education: 95  . Highest education level:  Associate degree: occupational, Hotel manager, or vocational program  Occupational History  . 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  Tobacco Use  . Smoking status: Current Every Day Smoker    Packs/day: 0.25    Types: Cigarettes  . Smokeless tobacco: Never Used  . Tobacco comment: Trying to cut on number of cigarettes she smokes.  Vaping Use  . Vaping Use: Never used  Substance and Sexual Activity  . Alcohol use: Yes    Comment: occassionally   . Drug use: No  . Sexual activity: Not on file  Other Topics Concern  . Not on file  Social History Narrative   Lives with her husband, Jody Wilson, and has two grown sons and two grand sons.   She visits family and has a once weekly outing with a friend for socializing.  She enjoys making beaded jewelry, reading and walking..   Social Determinants of Health   Financial Resource Strain: Low Risk   . Difficulty of Paying Living Expenses: Not hard at all  Food Insecurity: No Food Insecurity  . Worried About Charity fundraiser in the Last Year: Never true  . Ran Out of Food in the Last Year: Never true  Transportation Needs: No Transportation Needs  . Lack of Transportation (Medical): No  . Lack of Transportation (Non-Medical): No  Physical Activity: Sufficiently Active  . Days of Exercise per Week: 5 days  .  Minutes of Exercise per Session: 60 min  Stress:   . Feeling of Stress :   Social Connections: Socially Integrated  . Frequency of Communication with Friends and Family: Twice a week  . Frequency of Social Gatherings with Friends and Family: Once a week  . Attends Religious Services: More than 4 times per year  . Active Member of Clubs or Organizations: Yes  . Attends Archivist Meetings: More than 4 times per year  . Marital Status: Married  Human resources officer Violence: Not At Risk  . Fear of Current or Ex-Partner: No  . Emotionally Abused: No  . Physically  Abused: No  . Sexually Abused: No    FAMILY HISTORY:  Family History  Problem Relation Age of Onset  . Heart disease Mother   . Hypertension Mother   . Hyperlipidemia Mother   . Diabetes Mother   . Congestive Heart Failure Mother   . Hypertension Son   . Other Son 15       Kienbock's Disease  . Liver disease Son 50       Gilbert's Disease  . Aneurysm Paternal Uncle   . Heart attack Maternal Grandmother   . Heart attack Maternal Grandfather   . Lupus Half-Sister     CURRENT MEDICATIONS:  Current Outpatient Medications  Medication Sig Dispense Refill  . calcium-vitamin D (OSCAL WITH D) 500-200 MG-UNIT tablet Take 1 tablet by mouth 2 (two) times daily.     . citalopram (CELEXA) 20 MG tablet Take 1 tablet (20 mg total) by mouth daily. 90 tablet 3  . denosumab (PROLIA) 60 MG/ML SOSY injection Inject 60 mg into the skin every 6 (six) months. 2 mL 0  . hydrochlorothiazide (MICROZIDE) 12.5 MG capsule Take 1 capsule (12.5 mg total) by mouth daily. 90 capsule 3  . Multiple Vitamins-Minerals (WOMENS 50+ MULTI VITAMIN/MIN PO) Take 1 tablet by mouth daily.     Marland Kitchen olmesartan (BENICAR) 40 MG tablet TAKE 1 TABLET DAILY (Patient taking differently: Take 40 mg by mouth daily. ) 90 tablet 0  . pantoprazole (PROTONIX) 40 MG tablet Take 1 tablet (40 mg total) by mouth daily. 90 tablet 3  . pravastatin (PRAVACHOL) 20 MG tablet Take 1 tablet (20 mg total) by mouth daily. 90 tablet 1  . valACYclovir (VALTREX) 1000 MG tablet Take 2 tablets (2,000 mg total) by mouth 2 (two) times daily. For 2 days (Patient taking differently: Take 2,000 mg by mouth 2 (two) times daily as needed (Fever blisters). ) 8 tablet 10  . cyanocobalamin (,VITAMIN B-12,) 1000 MCG/ML injection Inject 1 mL (1,000 mcg total) into the muscle once for 1 dose. 1 mL 12  . LORazepam (ATIVAN) 0.5 MG tablet Take 1 tablet (0.5 mg total) by mouth daily as needed for anxiety. (Patient not taking: Reported on 07/28/2020) 30 tablet 2   Current  Facility-Administered Medications  Medication Dose Route Frequency Provider Last Rate Last Admin  . denosumab (PROLIA) injection 60 mg  60 mg Subcutaneous Q6 months Dettinger, Fransisca Kaufmann, MD   60 mg at 11/14/19 1553    ALLERGIES:  Allergies  Allergen Reactions  . Tetracyclines & Related Itching and Swelling    PHYSICAL EXAM:  Performance status (ECOG): 1 - Symptomatic but completely ambulatory  Vitals:   07/28/20 0750  BP: (!) 151/82  Pulse: (!) 54  Resp: 18  Temp: (!) 97.1 F (36.2 C)  SpO2: 96%   Wt Readings from Last 3 Encounters:  07/28/20 144 lb 6.4 oz (65.5 kg)  07/15/20 141 lb 4.8 oz (64.1 kg)  07/11/20 143 lb 1.3 oz (64.9 kg)   Physical Exam Vitals reviewed.  Constitutional:      Appearance: Normal appearance.  Cardiovascular:     Rate and Rhythm: Normal rate and regular rhythm.     Pulses: Normal pulses.     Heart sounds: Normal heart sounds.  Pulmonary:     Effort: Pulmonary effort is normal.     Breath sounds: Normal breath sounds.  Lymphadenopathy:     Cervical: No cervical adenopathy.     Upper Body:     Right upper body: No supraclavicular or axillary adenopathy.     Left upper body: No supraclavicular or axillary adenopathy.  Neurological:     General: No focal deficit present.     Mental Status: She is alert and oriented to person, place, and time.  Psychiatric:        Mood and Affect: Mood normal.        Behavior: Behavior normal.     LABORATORY DATA:  I have reviewed the labs as listed.  CBC Latest Ref Rng & Units 07/11/2020 07/10/2020 06/23/2020  WBC 4.0 - 10.5 K/uL 8.2 7.2 6.4  Hemoglobin 12.0 - 15.0 g/dL 16.0(H) 16.5(H) 16.3(H)  Hematocrit 36 - 46 % 46.4(H) 48.4(H) 46.9(H)  Platelets 150 - 400 K/uL 265 244 263   CMP Latest Ref Rng & Units 07/11/2020 07/10/2020 06/24/2020  Glucose 70 - 99 mg/dL 95 108(H) 108(H)  BUN 8 - 23 mg/dL 16 17 11   Creatinine 0.44 - 1.00 mg/dL 0.61 0.57 0.60  Sodium 135 - 145 mmol/L 137 137 139  Potassium 3.5 - 5.1  mmol/L 3.5 4.0 3.6  Chloride 98 - 111 mmol/L 103 102 103  CO2 22 - 32 mmol/L 23 25 24   Calcium 8.9 - 10.3 mg/dL 9.0 8.6(L) 8.7(L)  Total Protein 6.5 - 8.1 g/dL 6.9 7.0 -  Total Bilirubin 0.3 - 1.2 mg/dL 0.5 1.2 -  Alkaline Phos 38 - 126 U/L 56 61 -  AST 15 - 41 U/L 31 24 -  ALT 0 - 44 U/L 22 20 -      Component Value Date/Time   RBC 4.45 07/11/2020 2128   MCV 104.3 (H) 07/11/2020 2128   MCV 91 09/07/2019 0903   MCH 36.0 (H) 07/11/2020 2128   MCHC 34.5 07/11/2020 2128   RDW 11.6 07/11/2020 2128   RDW 14.3 09/07/2019 0903   LYMPHSABS 2.9 07/11/2020 2128   LYMPHSABS 1.7 09/07/2019 0903   MONOABS 0.5 07/11/2020 2128   EOSABS 0.1 07/11/2020 2128   EOSABS 0.1 09/07/2019 0903   BASOSABS 0.0 07/11/2020 2128   BASOSABS 0.1 09/07/2019 0903   Lab Results  Component Value Date   TIBC 353 07/10/2020   TIBC 315 04/02/2020   TIBC 391 03/04/2020   FERRITIN 113 07/10/2020   FERRITIN 284 04/02/2020   FERRITIN 55 03/04/2020   IRONPCTSAT 55 (H) 07/10/2020   IRONPCTSAT 50 (H) 04/02/2020   IRONPCTSAT 28 03/04/2020    DIAGNOSTIC IMAGING:  I have independently reviewed the scans and discussed with the patient. No results found.   ASSESSMENT:  1.  Normocytic anemia: -Last colonoscopy by Dr. Paulita Fujita to [facility] gastroenterology in [place] in 2015, small polyp removed. -She received Feraheme infusion on 03/17/2020.  2.  Osteoporosis: -DEXA scan on 01/31/2017 showed T score of -2.9.  She is tolerating Prolia very well.  3.  Tobacco abuse: -She smoked 1 pack/day for 40 years. -CT lung cancer screening scan on  04/25/2020 was lung RADS 1S.    PLAN:  1.  Normocytic anemia: -Denies any bleeding per rectum or melena. -Reportedly underwent colonoscopy and EGD by Dr. Paulita Fujita recently.  I could not locate the pathology reports. -CBC from 07/11/2020 shows hemoglobin 16 and MCV of 104.  Ferritin was 113 with percent saturation of 55.  No parenteral iron therapy needed. -I will see her back in 4  months with repeat labs.  2.  Vitamin B12 deficiency: -B12 level was 166.  She reports that she has been taking B12 1 tablet daily. -We will start her on parenteral iron therapy.  She may discontinue oral B12.  She will receive B12 injection today.  We will send her prescription to her pharmacy to be taken at home.  3.  Folic acid deficiency: -Continue folic acid 1 mg daily.  4.  Osteoporosis: -Continue Prolia every 6 months.  Calcium is within normal limits.  5.  Tobacco abuse: -We will plan to repeat another CT scan in May 2022.  Orders placed this encounter:  Orders Placed This Encounter  Procedures  . CBC with Differential  . Comprehensive metabolic panel  . Ferritin  . Iron and TIBC  . Vitamin B12     Derek Jack, MD [place] [phone]   I, Milinda Antis, am acting as a scribe for Dr. Sanda Linger.  I, Derek Jack MD, have reviewed the above documentation for accuracy and completeness, and I agree with the above.

## 2020-07-28 NOTE — Patient Instructions (Signed)
[place] at [facility] Discharge Instructions  You were seen today by Dr. Delton Coombes. He went over your recent results. You received your vitamin B12 injection today; you will be prescribed monthly B12 injections to take at home. Dr. Delton Coombes will see you back in 4 months for labs and follow up.   Thank you for choosing [place] at [facility] to provide your oncology and hematology care.  To afford each patient quality time with our provider, please arrive at least 15 minutes before your scheduled appointment time.   If you have a lab appointment with the [facility] please come in thru the Main Entrance and check in at the main information desk  You need to re-schedule your appointment should you arrive 10 or more minutes late.  We strive to give you quality time with our providers, and arriving late affects you and other patients whose appointments are after yours.  Also, if you no show three or more times for appointments you may be dismissed from the clinic at the providers discretion.     Again, thank you for choosing [facility].  Our hope is that these requests will decrease the amount of time that you wait before being seen by our physicians.       _____________________________________________________________  Should you have questions after your visit to [facility], please contact our office at [phone] between the hours of 8:00 a.m. and 4:30 p.m.  Voicemails left after 4:00 p.m. will not be returned until the following business day.  For prescription refill requests, have your pharmacy contact our office and allow 72 hours.    Cancer Center Support Programs:   > Cancer Support Group  2nd Tuesday of the month 1pm-2pm, Journey Room

## 2020-07-29 ENCOUNTER — Encounter: Payer: Self-pay | Admitting: Cardiology

## 2020-07-29 ENCOUNTER — Ambulatory Visit: Payer: Medicare Other | Admitting: Cardiology

## 2020-07-29 VITALS — BP 154/87 | HR 76 | Resp 18 | Ht 64.5 in | Wt 143.0 lb

## 2020-07-29 DIAGNOSIS — R0609 Other forms of dyspnea: Secondary | ICD-10-CM | POA: Diagnosis not present

## 2020-07-29 DIAGNOSIS — F17200 Nicotine dependence, unspecified, uncomplicated: Secondary | ICD-10-CM | POA: Diagnosis not present

## 2020-07-29 DIAGNOSIS — I2510 Atherosclerotic heart disease of native coronary artery without angina pectoris: Secondary | ICD-10-CM

## 2020-07-29 DIAGNOSIS — R0600 Dyspnea, unspecified: Secondary | ICD-10-CM

## 2020-07-29 DIAGNOSIS — I2584 Coronary atherosclerosis due to calcified coronary lesion: Secondary | ICD-10-CM | POA: Diagnosis not present

## 2020-07-29 DIAGNOSIS — R55 Syncope and collapse: Secondary | ICD-10-CM

## 2020-07-29 DIAGNOSIS — IMO0001 Reserved for inherently not codable concepts without codable children: Secondary | ICD-10-CM

## 2020-07-29 DIAGNOSIS — I6523 Occlusion and stenosis of bilateral carotid arteries: Secondary | ICD-10-CM

## 2020-07-29 DIAGNOSIS — I700 Atherosclerosis of aorta: Secondary | ICD-10-CM | POA: Diagnosis not present

## 2020-07-29 DIAGNOSIS — E782 Mixed hyperlipidemia: Secondary | ICD-10-CM | POA: Diagnosis not present

## 2020-07-29 DIAGNOSIS — I10 Essential (primary) hypertension: Secondary | ICD-10-CM

## 2020-07-29 MED ORDER — ASPIRIN EC 81 MG PO TBEC: 81.0000 mg | DELAYED_RELEASE_TABLET | Freq: Every day | ORAL | 3 refills | Status: DC

## 2020-07-29 NOTE — Progress Notes (Signed)
Date:  07/29/2020   ID:  Jody Wilson, DOB [birth year], MRN [id number]  PCP:  Dettinger, Fransisca Kaufmann, MD  Cardiologist:  Rex Kras, DO, [facility] (established care 07/29/2020)  REASON FOR CONSULT: Syncope  REQUESTING PHYSICIAN:  Dettinger, Fransisca Kaufmann, MD [address] [place],  [place] [address]  Chief Complaint  Patient presents with  . Hospitalization Follow-up    Syncope  . New Patient (Initial Visit)    HPI  Jody Wilson is a 69 y.o. female who presents to the office with a chief complaint of " recent hospitalization for syncope." She is referred to the office at the request of Dettinger, Fransisca Kaufmann, MD. Patient's past medical history and cardiovascular risk factors include: hypertension, hyperlipidemia, GERD, active smoker, Thoracic aortic atherosclerosis, coronary artery calcification, advanced age, postmenopausal female.  Patient is referred to the office at the request of her primary care provider for evaluation of syncope.  Patient states that during the Fourth of July weekend she was cooking for the family throughout the day and later that day while she was cooking she started getting dizzy, felt hot, generalized sweating and was about to go sit down to rest. In the process patient had a syncopal event and was on the floor for several seconds before regaining consciousness. EMS was called and according to the patient she had other smaller episodes of possibly losing consciousness.  She was hospitalized and underwent orthostatic vital signs, echo, carotid duplex, CT and MRI. Results reviewed and noted below for further reference. Patient is orthostatic vital signs were positive per EMR but do not have the vitals to review. Per discharge summary the working diagnosis was vasovagal syncope and patient was recommended to see cardiology as outpatient for possible monitor.  Patient states that prior to the hospitalization she is not consuming three balanced meals and was dehydrated. And  since then this has improved and her symptoms have not resurfaced. She consumes 1 cup of coffee and soda per day. No use of recreational drugs, excessive alcohol, herbal supplements or over-the-counter medications or stimulant drugs.  Review of systems are positive for dizziness and lightheadedness. She had a carotid duplex done during her prior hospitalizations. Results noted below. No significant carotid artery atherosclerosis. Patient states that in the past she has had issues with balance and plans to follow-up with her former neurologist. Will defer management to her per neurology at this time.  Patient does have history of hypertension. Patient does not keep a log of her blood pressures but does check it usually on a daily basis an hour after taking her medications. Patient states her morning blood pressures were around 149/81.   She continues to smoke half a pack per day.  She was recently started on statin medications about 1 to 2 weeks ago according to the patient.  No family history of premature coronary disease or sudden cardiac death.  Denies prior history of coronary artery disease, myocardial infarction, congestive heart failure, deep venous thrombosis, pulmonary embolism, stroke, transient ischemic attack.  FUNCTIONAL STATUS: Walks 41min everyday 4 days a week.    ALLERGIES: Allergies  Allergen Reactions  . Tetracyclines & Related Itching and Swelling    MEDICATION LIST PRIOR TO VISIT: Current Meds  Medication Sig  . calcium-vitamin D (OSCAL WITH D) 500-200 MG-UNIT tablet Take 1 tablet by mouth 2 (two) times daily.   . citalopram (CELEXA) 20 MG tablet Take 1 tablet (20 mg total) by mouth daily.  . cyanocobalamin (,VITAMIN B-12,) 1000 MCG/ML  injection Inject 1,000 mcg into the muscle once.  Marland Kitchen denosumab (PROLIA) 60 MG/ML SOSY injection Inject 60 mg into the skin every 6 (six) months.  . hydrochlorothiazide (MICROZIDE) 12.5 MG capsule Take 1 capsule (12.5 mg total) by mouth  daily.  Marland Kitchen LORazepam (ATIVAN) 0.5 MG tablet Take 1 tablet (0.5 mg total) by mouth daily as needed for anxiety.  . Multiple Vitamins-Minerals (WOMENS 50+ MULTI VITAMIN/MIN PO) Take 1 tablet by mouth daily.   Marland Kitchen olmesartan (BENICAR) 40 MG tablet TAKE 1 TABLET DAILY (Patient taking differently: Take 40 mg by mouth daily. )  . pantoprazole (PROTONIX) 40 MG tablet Take 1 tablet (40 mg total) by mouth daily.  . pravastatin (PRAVACHOL) 20 MG tablet Take 1 tablet (20 mg total) by mouth daily.  . valACYclovir (VALTREX) 1000 MG tablet Take 2 tablets (2,000 mg total) by mouth 2 (two) times daily. For 2 days (Patient taking differently: Take 2,000 mg by mouth 2 (two) times daily as needed (Fever blisters). )   Current Facility-Administered Medications for the 07/29/20 encounter (Office Visit) with Rex Kras, DO  Medication  . denosumab (PROLIA) injection 60 mg     PAST MEDICAL HISTORY: Past Medical History:  Diagnosis Date  . Anemia   . Anxiety   . Aortic atherosclerosis ([place])   . Depression   . GERD (gastroesophageal reflux disease)   . Hyperlipidemia   . Hypertension   . Orthostatic hypotension   . Osteoporosis     PAST SURGICAL HISTORY: Past Surgical History:  Procedure Laterality Date  . ABDOMINAL HYSTERECTOMY    . BACK SURGERY    . CHOLECYSTECTOMY    . FOOT SURGERY     7 in the past  . IR GENERIC HISTORICAL  12/09/2016   IR RADIOLOGIST EVAL & MGMT 12/09/2016 [facility]  . IR GENERIC HISTORICAL  12/10/2016   IR VERTEBROPLASTY CERV/THOR BX INC UNI/BIL INC/INJECT/IMAGING 12/10/2016 [facility]  . TRACHEOSTOMY TUBE PLACEMENT  1982    FAMILY HISTORY: The patient family history includes Aneurysm in her paternal uncle; Atrial fibrillation in her half-sister; Congestive Heart Failure in her mother; Diabetes in her mother; Heart attack in her maternal grandfather and maternal grandmother; Heart disease in her mother; Hyperlipidemia in her mother; Hypertension in her mother and son;  Liver disease (age of onset: 65) in her son; Lupus in her half-sister; Other (age of onset: 7) in her son.  SOCIAL HISTORY:  The patient  reports that she has been smoking cigarettes. She has been smoking about 0.25 packs per day. She has never used smokeless tobacco. She reports previous alcohol use. She reports that she does not use drugs.  REVIEW OF SYSTEMS: Review of Systems  Constitutional: Negative for chills and fever.  HENT: Negative for hoarse voice and nosebleeds.   Eyes: Negative for discharge, double vision and pain.  Cardiovascular: Positive for dyspnea on exertion and syncope (recent history). Negative for chest pain, claudication, leg swelling, near-syncope, orthopnea, palpitations and paroxysmal nocturnal dyspnea.  Respiratory: Negative for hemoptysis and shortness of breath.   Musculoskeletal: Negative for muscle cramps and myalgias.  Gastrointestinal: Negative for abdominal pain, constipation, diarrhea, hematemesis, hematochezia, melena, nausea and vomiting.  Neurological: Positive for dizziness and light-headedness.    PHYSICAL EXAM: Vitals with BMI 07/29/2020 07/28/2020 07/15/2020  Height 5' 4.5" - 5' 4.5"  Weight 143 lbs 144 lbs 6 oz 141 lbs 5 oz  BMI 24.18 97.98 92.11  Systolic 941 740 814  Diastolic 87 82 70  Pulse 76 54 66  Orthostatic VS for the past 72 hrs (Last 3 readings):  Orthostatic BP Patient Position BP Location Cuff Size Orthostatic Pulse  07/29/20 1449 (!) 143/91 Standing Left Arm Normal 82  07/29/20 1448 143/86 Sitting Left Arm Normal 74  07/29/20 1447 (!) 152/95 Supine Left Arm Normal 72   CONSTITUTIONAL: Well-developed and well-nourished. No acute distress.  SKIN: Skin is warm and dry. No rash noted. No cyanosis. No pallor. No jaundice HEAD: Normocephalic and atraumatic.  EYES: No scleral icterus MOUTH/THROAT: Moist oral membranes.  NECK: No JVD present. No thyromegaly noted. No carotid bruits  LYMPHATIC: No visible cervical adenopathy.    CHEST Normal respiratory effort. No intercostal retractions  LUNGS: Clear to auscultation bilaterally. No stridor. No wheezes. No rales.  CARDIOVASCULAR: Regular rate and rhythm, positive S1-S2, no murmurs rubs or gallops appreciated. ABDOMINAL: No apparent ascites.  EXTREMITIES: No peripheral edema  HEMATOLOGIC: No significant bruising NEUROLOGIC: Oriented to person, place, and time. Nonfocal. Normal muscle tone.  PSYCHIATRIC: Normal mood and affect. Normal behavior. Cooperative  RADIOLOGY:  MR Abdomen w/ and w/o contrast 06/03/2020:  1. Area of concern on prior chest CT represents geographic fat intensification on a background of global mild hepatic steatosis. No suspicious lesion in the liver with lobular contours. Correlate with any clinical or laboratory evidence of liver disease. 2. Small focal outpouching approximately 7 x 7 mm along the distal thoracic aorta may represent a penetrating atherosclerotic ulcer not associated with acute features and unchanged since previous chest CT. Consider CT angiogram follow-up in 1 year with vascular surgery consultation to guide further follow-up. 3. Large hiatal hernia extending into the chest as on the recent CT of the chest. 4. Compression fracture at T11 better demonstrated on recent chest CT. 5. Aortic atherosclerosis.  CT chest lung cancer screening: 04/25/2020: Mild coronary atherosclerosis of the LAD.  CARDIAC DATABASE: EKG: 07/29/2020: Normal sinus rhythm, 79 bpm, normal axis, left atrial enlargement, without underlying ischemia or injury pattern.  Echocardiogram: 06/23/2020: LVEF 60-65%, indeterminate diastolic parameters, no pulmonary hypertension, RVSP 20 mmHg mild MR, aortic valve sclerosis without stenosis.  Stress Testing: None  Heart Catheterization: None  Carotid duplex: 06/23/2020:  Right Carotid: Velocities in the right ICA are consistent with a 1-39% stenosis. Left Carotid: Velocities in the left ICA are consistent with a  1-39% stenosis. Vertebrals: Bilateral vertebral arteries demonstrate antegrade flow.  LABORATORY DATA: CBC Latest Ref Rng & Units 07/11/2020 07/10/2020 06/23/2020  WBC 4.0 - 10.5 K/uL 8.2 7.2 6.4  Hemoglobin 12.0 - 15.0 g/dL 16.0(H) 16.5(H) 16.3(H)  Hematocrit 36 - 46 % 46.4(H) 48.4(H) 46.9(H)  Platelets 150 - 400 K/uL 265 244 263    CMP Latest Ref Rng & Units 07/11/2020 07/10/2020 06/24/2020  Glucose 70 - 99 mg/dL 95 108(H) 108(H)  BUN 8 - 23 mg/dL 16 17 11   Creatinine 0.44 - 1.00 mg/dL 0.61 0.57 0.60  Sodium 135 - 145 mmol/L 137 137 139  Potassium 3.5 - 5.1 mmol/L 3.5 4.0 3.6  Chloride 98 - 111 mmol/L 103 102 103  CO2 22 - 32 mmol/L 23 25 24   Calcium 8.9 - 10.3 mg/dL 9.0 8.6(L) 8.7(L)  Total Protein 6.5 - 8.1 g/dL 6.9 7.0 -  Total Bilirubin 0.3 - 1.2 mg/dL 0.5 1.2 -  Alkaline Phos 38 - 126 U/L 56 61 -  AST 15 - 41 U/L 31 24 -  ALT 0 - 44 U/L 22 20 -    Lipid Panel     Component Value Date/Time   CHOL 229 (H)  07/10/2020 1327   CHOL 207 (H) 05/30/2019 0912   TRIG 163 (H) 07/10/2020 1327   TRIG 193 (H) 10/17/2014 1249   HDL 70 07/10/2020 1327   HDL 52 05/30/2019 0912   HDL 47 10/17/2014 1249   CHOLHDL 3.3 07/10/2020 1327   VLDL 33 07/10/2020 1327   LDLCALC 126 (H) 07/10/2020 1327   LDLCALC 113 (H) 05/30/2019 0912   LABVLDL 42 (H) 05/30/2019 0912    No components found for: NTPROBNP No results for input(s): PROBNP in the last 8760 hours. Recent Labs    09/25/19 1442 06/23/20 0232  TSH 0.884 1.610    BMP Recent Labs    06/24/20 0845 07/10/20 1327 07/11/20 2128  NA 139 137 137  K 3.6 4.0 3.5  CL 103 102 103  CO2 24 25 23   GLUCOSE 108* 108* 95  BUN 11 17 16   CREATININE 0.60 0.57 0.61  CALCIUM 8.7* 8.6* 9.0  GFRNONAA >60 >60 >60  GFRAA >60 >60 >60    HEMOGLOBIN A1C No results found for: HGBA1C, MPG  IMPRESSION:    ICD-10-CM   1. Syncope and collapse  R55 LONG TERM MONITOR-LIVE TELEMETRY (3-14 DAYS)  2. Essential hypertension  I10 EKG 12-Lead  3. Mixed  hyperlipidemia  E78.2   4. Smoking  F17.200   5. Dyspnea on exertion  R06.00 PCV MYOCARDIAL PERFUSION WO LEXISCAN  6. Aortic atherosclerosis ([place]), CT Chest 04/25/2020  I70.0   7. Coronary atherosclerosis due to calcified coronary lesion of native artery  I25.10 PCV MYOCARDIAL PERFUSION WO LEXISCAN   I25.84 aspirin EC 81 MG tablet  8. Atherosclerosis of both carotid arteries  I65.23      RECOMMENDATIONS: Jody Wilson is a 69 y.o. female whose past medical history and cardiac risk factors include:  hypertension, hyperlipidemia, GERD, active smoker, Thoracic aortic atherosclerosis, coronary artery calcification, advanced age, postmenopausal female.  Syncope:  Patient was recently hospitalized for an episode of syncope. Based on the work-up it appears the patient was orthostatic and was diagnosed with vasovagal syncope. Based on the history that I obtained vasovagal syncope appears to be an appropriate working diagnosis of her event back in July 2021.  Orthostatic vital signs at today's office visit negative.  Patient is asked to keep her self well-hydrated and to eat three balanced heart healthy meals.  Patient had a detailed work-up the hospital including orthostatic vital signs, echocardiogram, carotid duplex CT head without contrast and MRI brain without contrast. Results reviewed and noted above for further reference.  EKG shows normal sinus rhythm without underlying ischemia or injury pattern.  14-day mobile cardiac ambulatory telemetry to evaluate for underlying arrhythmia given her history of syncope.  Dyspnea on exertion:  Patient's review of systems are positive for dyspnea on exertion. Patient is able to walk 30 minutes a day of at least 3-4 times a week. However during the end of her activity she does experience effort related dyspnea. Patient has multiple cardiovascular risk factors as noted above including active smoking, coronary artery calcification in the LAD distribution,  aortic atherosclerosis, etc. Patient would benefit from an ischemic evaluation I would recommend exercise nuclear stress test to evaluate for functional status and exercise-induced ischemia.  Also educated on keeping her systolic blood pressures around 120 to 130 mmHg if tolerable but definitely less than 140 mmHg. Currently managed by PCP.  Patient is noted to have atherosclerosis in multiple vascular beds including the aorta, coronary and carotid arteries. Agree with the initiation of pravastatin. Lipid profile currently  managed by primary team. Recommend uptitrating statin therapy if LDL not at goal. Also recommend starting aspirin 81 mg p.o. daily given coronary artery atherosclerosis/calcification.  I independently reviewed this images of the CT chest lung cancer screening with the patient at today's office visit so she has a better visualization of what aortic atherosclerosis and coronary calcification mean patient was appreciative.  Smoking: Educated on the importance of complete smoking cessation.  FINAL MEDICATION LIST END OF ENCOUNTER: Meds ordered this encounter  Medications  . aspirin EC 81 MG tablet    Sig: Take 1 tablet (81 mg total) by mouth daily. Swallow whole.    Dispense:  90 tablet    Refill:  3    There are no discontinued medications. d  Current Outpatient Medications:  .  calcium-vitamin D (OSCAL WITH D) 500-200 MG-UNIT tablet, Take 1 tablet by mouth 2 (two) times daily. , Disp: , Rfl:  .  citalopram (CELEXA) 20 MG tablet, Take 1 tablet (20 mg total) by mouth daily., Disp: 90 tablet, Rfl: 3 .  cyanocobalamin (,VITAMIN B-12,) 1000 MCG/ML injection, Inject 1,000 mcg into the muscle once., Disp: , Rfl:  .  denosumab (PROLIA) 60 MG/ML SOSY injection, Inject 60 mg into the skin every 6 (six) months., Disp: 2 mL, Rfl: 0 .  hydrochlorothiazide (MICROZIDE) 12.5 MG capsule, Take 1 capsule (12.5 mg total) by mouth daily., Disp: 90 capsule, Rfl: 3 .  LORazepam (ATIVAN) 0.5 MG  tablet, Take 1 tablet (0.5 mg total) by mouth daily as needed for anxiety., Disp: 30 tablet, Rfl: 2 .  Multiple Vitamins-Minerals (WOMENS 50+ MULTI VITAMIN/MIN PO), Take 1 tablet by mouth daily. , Disp: , Rfl:  .  olmesartan (BENICAR) 40 MG tablet, TAKE 1 TABLET DAILY (Patient taking differently: Take 40 mg by mouth daily. ), Disp: 90 tablet, Rfl: 0 .  pantoprazole (PROTONIX) 40 MG tablet, Take 1 tablet (40 mg total) by mouth daily., Disp: 90 tablet, Rfl: 3 .  pravastatin (PRAVACHOL) 20 MG tablet, Take 1 tablet (20 mg total) by mouth daily., Disp: 90 tablet, Rfl: 1 .  valACYclovir (VALTREX) 1000 MG tablet, Take 2 tablets (2,000 mg total) by mouth 2 (two) times daily. For 2 days (Patient taking differently: Take 2,000 mg by mouth 2 (two) times daily as needed (Fever blisters). ), Disp: 8 tablet, Rfl: 10 .  aspirin EC 81 MG tablet, Take 1 tablet (81 mg total) by mouth daily. Swallow whole., Disp: 90 tablet, Rfl: 3  Current Facility-Administered Medications:  .  denosumab (PROLIA) injection 60 mg, 60 mg, Subcutaneous, Q6 months, Dettinger, Fransisca Kaufmann, MD, 60 mg at 11/14/19 1553  Orders Placed This Encounter  Procedures  . LONG TERM MONITOR-LIVE TELEMETRY (3-14 DAYS)  . PCV MYOCARDIAL PERFUSION WO LEXISCAN  . EKG 12-Lead    There are no Patient Instructions on file for this visit.   --Continue cardiac medications as reconciled in final medication list. --Return in about 6 weeks (around 09/09/2020) for follow up syncope, test results. . Or sooner if needed. --Continue follow-up with your primary care physician regarding the management of your other chronic comorbid conditions.  Patient's questions and concerns were addressed to her satisfaction. She voices understanding of the instructions provided during this encounter.   This note was created using a voice recognition software as a result there may be grammatical errors inadvertently enclosed that do not reflect the nature of this encounter.  Every attempt is made to correct such errors.  Rex Kras, [place], [facility]  Pager: [phone]  Office: [phone]

## 2020-07-31 ENCOUNTER — Telehealth (HOSPITAL_COMMUNITY): Payer: Self-pay | Admitting: *Deleted

## 2020-07-31 NOTE — Telephone Encounter (Signed)
Pt called stating that she needed clarification on her medications. I advised the pt of her medications per Dr. Tomie China note from her last visit. Pt verbalized understanding.

## 2020-08-13 ENCOUNTER — Other Ambulatory Visit: Payer: Self-pay

## 2020-08-13 ENCOUNTER — Ambulatory Visit: Payer: Medicare Other

## 2020-08-13 DIAGNOSIS — R0600 Dyspnea, unspecified: Secondary | ICD-10-CM

## 2020-08-13 DIAGNOSIS — I2510 Atherosclerotic heart disease of native coronary artery without angina pectoris: Secondary | ICD-10-CM | POA: Diagnosis not present

## 2020-08-13 DIAGNOSIS — I2584 Coronary atherosclerosis due to calcified coronary lesion: Secondary | ICD-10-CM | POA: Diagnosis not present

## 2020-08-13 DIAGNOSIS — R55 Syncope and collapse: Secondary | ICD-10-CM

## 2020-08-13 DIAGNOSIS — R0609 Other forms of dyspnea: Secondary | ICD-10-CM | POA: Diagnosis not present

## 2020-08-16 ENCOUNTER — Other Ambulatory Visit: Payer: Self-pay | Admitting: Family Medicine

## 2020-08-16 DIAGNOSIS — F4323 Adjustment disorder with mixed anxiety and depressed mood: Secondary | ICD-10-CM

## 2020-09-03 DIAGNOSIS — R55 Syncope and collapse: Secondary | ICD-10-CM | POA: Diagnosis not present

## 2020-09-11 DIAGNOSIS — R197 Diarrhea, unspecified: Secondary | ICD-10-CM | POA: Diagnosis not present

## 2020-09-11 DIAGNOSIS — K219 Gastro-esophageal reflux disease without esophagitis: Secondary | ICD-10-CM | POA: Diagnosis not present

## 2020-09-11 DIAGNOSIS — R932 Abnormal findings on diagnostic imaging of liver and biliary tract: Secondary | ICD-10-CM | POA: Diagnosis not present

## 2020-09-11 DIAGNOSIS — R634 Abnormal weight loss: Secondary | ICD-10-CM | POA: Diagnosis not present

## 2020-09-11 DIAGNOSIS — D509 Iron deficiency anemia, unspecified: Secondary | ICD-10-CM | POA: Diagnosis not present

## 2020-09-12 ENCOUNTER — Telehealth: Payer: Self-pay | Admitting: Neurology

## 2020-09-12 NOTE — Telephone Encounter (Signed)
This is FYI, pt was improving but sypmtoms are now worseing/returning.  Pt has been scheduled for a f/u with Sarah,NP.  Pt is aware to check in at 3:15, this again is FYI no call back requested

## 2020-09-16 ENCOUNTER — Ambulatory Visit (INDEPENDENT_AMBULATORY_CARE_PROVIDER_SITE_OTHER): Payer: Medicare Other | Admitting: Neurology

## 2020-09-16 ENCOUNTER — Encounter: Payer: Self-pay | Admitting: Neurology

## 2020-09-16 ENCOUNTER — Other Ambulatory Visit: Payer: Self-pay

## 2020-09-16 VITALS — BP 162/91 | HR 66 | Ht 63.0 in | Wt 142.0 lb

## 2020-09-16 DIAGNOSIS — G253 Myoclonus: Secondary | ICD-10-CM

## 2020-09-16 DIAGNOSIS — D509 Iron deficiency anemia, unspecified: Secondary | ICD-10-CM | POA: Diagnosis not present

## 2020-09-16 DIAGNOSIS — E538 Deficiency of other specified B group vitamins: Secondary | ICD-10-CM | POA: Diagnosis not present

## 2020-09-16 MED ORDER — LEVETIRACETAM 250 MG PO TABS
250.0000 mg | ORAL_TABLET | Freq: Every day | ORAL | 3 refills | Status: DC
Start: 2020-09-16 — End: 2021-05-25

## 2020-09-16 NOTE — Progress Notes (Signed)
I have read the note, and I agree with the clinical assessment and plan.  Yamari Ventola K Robbie Rideaux   

## 2020-09-16 NOTE — Patient Instructions (Addendum)
You may try taking Xanax at night before bed If the Xanax is not helpful then stop it, try Keppra 250 mg at bedtime  See you back in 6 months

## 2020-09-16 NOTE — Progress Notes (Signed)
PATIENT: Jody Wilson DOB: Oct 15, 1951  REASON FOR VISIT: follow up HISTORY FROM: patient  HISTORY OF PRESENT ILLNESS: Today 09/16/20 JodyWilson is a 69 year old female with history of facial twitching, mostly at nighttime.  This has been chronic, associated with being on antidepressants, when last seen she was taken off mirtazapine, things improved for awhile, but frequency returned.  Is receiving IV iron infusions (3 iron infusions so far), B12 (doing B12 injections at home x 1 month, oral wasn't enough).  MRI of the brain was done in July 2021, ordered by PCP for syncope, unremarkable with exception mild cerebral white matter disease.  She was hospitalized in the summer for syncope, felt to be dehydration related, ETOH level was 180.  She was in the ER 7/23 for unresponsiveness.  Was intoxicated with ETOH, level over 200. Has had 10-15 year history of intermittent jerks of the jaw and face, mostly at nighttime. TSH normal 1.610. Stopped lorazepam, takes Xanax prescribed 0.5 mg twice daily, only takes in the morning.   Still has facial twitching to the jaw, cheek, rarely during the day, almost always at night, at most 3-4 nights a week, other times goes 2 weeks with nothing, is not painful. Will get up, get on her phone/distract herself, take benadryl , will eventually go away. Indicates, no longer drinking ETOH since last ER visit, she does smoke. In the last year, lost 30 pounds, doesn't eat much, has seen GI had endoscopy/colonoscopy, three polyps removed. Seeing cardiology tomorrow for syncope, wore her monitor, awaiting results. Here today for follow-up accompanied by her son, he had stroke in May, she has a lot of stress worrying about him and her husband. Has been on SSRI since 1995.  HISTORY 09/26/2019 Dr. Jannifer Franklin: Jody Wilson is a 69 year old right-handed white female with a 10 to 15-year history of intermittent episodes of jerks of the jaw and face that may occur mainly at nighttime.  In  the past, she has noted severe exacerbations of her symptoms while taking Wellbutrin and Zoloft combination.  She has had a baseline frequency in the past of an episode or 2 every month or so, but recently they have become much more frequent, occurring 3-4 times at night.  She has noted that lack of sleep or stress may make the episodes worse.  She currently is on a combination of mirtazapine and Celexa, she cut back on the Celexa dose from 40 mg at day to 20 mg a day because the higher dose of Celexa worsens her symptoms.  She recently was found to have an iron deficiency, she has been craving ice recently.  She also has B12 deficiency, she got her first B12 injection yesterday.  She will be getting IV iron through hematology in the near future.  She will take Benadryl at night which seems to help.  At times, the jaw jerks are severe enough that she may bite her inner cheeks.  Several years ago when using Zoloft and Wellbutrin combination, the jerks also affected arms and legs, this is not the case currently.  The patient rarely has jerks during the daytime.  She is sent to this office for an evaluation.  She reports no numbness or weakness of the face, arms, legs.  She does note some mild gait instability, but she has not had any falls.  She drinks an occasional tea, she may drink 1 Dr. Malachi Bonds a day.   REVIEW OF SYSTEMS: Out of a complete 14 system review of symptoms,  the patient complains only of the following symptoms, and all other reviewed systems are negative.  Facial twitching  ALLERGIES: Allergies  Allergen Reactions   Tetracyclines & Related Itching and Swelling    HOME MEDICATIONS: Outpatient Medications Prior to Visit  Medication Sig Dispense Refill   ALPRAZolam (XANAX) 0.5 MG tablet Take 0.5 mg by mouth at bedtime as needed for anxiety.     aspirin EC 81 MG tablet Take 1 tablet (81 mg total) by mouth daily. Swallow whole. 90 tablet 3   calcium-vitamin D (OSCAL WITH D) 500-200  MG-UNIT tablet Take 1 tablet by mouth 2 (two) times daily.      citalopram (CELEXA) 20 MG tablet Take 1 tablet (20 mg total) by mouth daily. 90 tablet 3   cyanocobalamin (,VITAMIN B-12,) 1000 MCG/ML injection Inject 1,000 mcg into the muscle once.     denosumab (PROLIA) 60 MG/ML SOSY injection Inject 60 mg into the skin every 6 (six) months. 2 mL 0   hydrochlorothiazide (MICROZIDE) 12.5 MG capsule Take 1 capsule (12.5 mg total) by mouth daily. 90 capsule 3   Multiple Vitamins-Minerals (WOMENS 50+ MULTI VITAMIN/MIN PO) Take 1 tablet by mouth daily.      olmesartan (BENICAR) 40 MG tablet TAKE 1 TABLET DAILY (Patient taking differently: Take 40 mg by mouth daily. ) 90 tablet 0   pantoprazole (PROTONIX) 40 MG tablet Take 1 tablet (40 mg total) by mouth daily. 90 tablet 3   pravastatin (PRAVACHOL) 20 MG tablet Take 1 tablet (20 mg total) by mouth daily. 90 tablet 1   valACYclovir (VALTREX) 1000 MG tablet Take 2 tablets (2,000 mg total) by mouth 2 (two) times daily. For 2 days (Patient taking differently: Take 2,000 mg by mouth 2 (two) times daily as needed (Fever blisters). ) 8 tablet 10   LORazepam (ATIVAN) 0.5 MG tablet Take 1 tablet (0.5 mg total) by mouth daily as needed for anxiety. 30 tablet 2   Facility-Administered Medications Prior to Visit  Medication Dose Route Frequency Provider Last Rate Last Admin   denosumab (PROLIA) injection 60 mg  60 mg Subcutaneous Q6 months Dettinger, Fransisca Kaufmann, MD   60 mg at 11/14/19 1553    PAST MEDICAL HISTORY: Past Medical History:  Diagnosis Date   Anemia    Anxiety    Aortic atherosclerosis ([place])    Depression    GERD (gastroesophageal reflux disease)    Hyperlipidemia    Hypertension    Orthostatic hypotension    Osteoporosis     PAST SURGICAL HISTORY: Past Surgical History:  Procedure Laterality Date   ABDOMINAL HYSTERECTOMY     BACK SURGERY     CHOLECYSTECTOMY     FOOT SURGERY     7 in the past   IR GENERIC  HISTORICAL  12/09/2016   IR RADIOLOGIST EVAL & MGMT 12/09/2016 [facility]   IR GENERIC HISTORICAL  12/10/2016   IR VERTEBROPLASTY CERV/THOR BX INC UNI/BIL INC/INJECT/IMAGING 12/10/2016 [facility]   TRACHEOSTOMY TUBE PLACEMENT  1982    FAMILY HISTORY: Family History  Problem Relation Age of Onset   Heart disease Mother    Hypertension Mother    Hyperlipidemia Mother    Diabetes Mother    Congestive Heart Failure Mother    Hypertension Son    Other Son 31       Kienbock's Disease   Liver disease Son 78       Gilbert's Disease   Aneurysm Paternal Uncle    Heart attack Maternal Grandmother  Heart attack Maternal Grandfather    Lupus Half-Sister    Atrial fibrillation Half-Sister     SOCIAL HISTORY: Social History   Socioeconomic History   Marital status: Married    Spouse name: Donnie   Number of children: 3   Years of education: 12   Highest education level: Associate degree: occupational, Hotel manager, or vocational program  Occupational History   Occupation: retired    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  Tobacco Use   Smoking status: Current Every Day Smoker    Packs/day: 0.25    Types: Cigarettes   Smokeless tobacco: Never Used   Tobacco comment: Trying to cut on number of cigarettes she smokes.  Vaping Use   Vaping Use: Never used  Substance and Sexual Activity   Alcohol use: Not Currently    Comment: occassionally    Drug use: No   Sexual activity: Not on file  Other Topics Concern   Not on file  Social History Narrative   Lives with her husband, [name], and has two grown sons and two grand sons.   She visits family and has a once weekly outing with a friend for socializing.  She enjoys making beaded jewelry, reading and walking..   Social Determinants of Health   Financial Resource Strain: Low Risk    Difficulty of Paying Living Expenses: Not hard at all  Food Insecurity:  No Food Insecurity   Worried About Charity fundraiser in the Last Year: Never true   Ran Out of Food in the Last Year: Never true  Transportation Needs: No Transportation Needs   Lack of Transportation (Medical): No   Lack of Transportation (Non-Medical): No  Physical Activity: Sufficiently Active   Days of Exercise per Week: 5 days   Minutes of Exercise per Session: 60 min  Stress:    Feeling of Stress : Not on file  Social Connections: Socially Integrated   Frequency of Communication with Friends and Family: Twice a week   Frequency of Social Gatherings with Friends and Family: Once a week   Attends Religious Services: More than 4 times per year   Active Member of [employer] or Organizations: Yes   Attends Music therapist: More than 4 times per year   Marital Status: Married  Human resources officer Violence: Not At Risk   Fear of Current or Ex-Partner: No   Emotionally Abused: No   Physically Abused: No   Sexually Abused: No   PHYSICAL EXAM  Vitals:   09/16/20 1450 09/16/20 1454  BP: (!) 160/100 (!) 162/91  Pulse: 71 66  Weight: 142 lb (64.4 kg)   Height: 5\' 3"  (1.6 m)    Body mass index is 25.15 kg/m.  Generalized: Well developed, in no acute distress   Neurological examination  Mentation: Alert oriented to time, place, history taking. Follows all commands speech and language fluent Cranial nerve II-XII: Pupils were equal round reactive to light. Extraocular movements were full, visual field were full on confrontational test. Facial sensation and strength were normal. Head turning and shoulder shrug  were normal and symmetric. Motor: The motor testing reveals 5 over 5 strength of all 4 extremities. Good symmetric motor tone is noted throughout.  Sensory: Sensory testing is intact to soft touch on all 4 extremities. No evidence of extinction is noted.  Coordination: Cerebellar testing reveals good finger-nose-finger and heel-to-shin bilaterally.    Gait and station: Gait is normal. Tandem gait is slightly unsteady.  Romberg is negative. No drift is seen.  Reflexes: Deep tendon reflexes are symmetric and normal bilaterally.   DIAGNOSTIC DATA (LABS, IMAGING, TESTING) - I reviewed patient records, labs, notes, testing and imaging myself where available.  Lab Results  Component Value Date   WBC 8.2 07/11/2020   HGB 16.0 (H) 07/11/2020   HCT 46.4 (H) 07/11/2020   MCV 104.3 (H) 07/11/2020   PLT 265 07/11/2020      Component Value Date/Time   NA 137 07/11/2020 2128   NA 137 09/07/2019 0903   K 3.5 07/11/2020 2128   CL 103 07/11/2020 2128   CO2 23 07/11/2020 2128   GLUCOSE 95 07/11/2020 2128   BUN 16 07/11/2020 2128   BUN 13 09/07/2019 0903   CREATININE 0.61 07/11/2020 2128   CALCIUM 9.0 07/11/2020 2128   PROT 6.9 07/11/2020 2128   PROT 6.6 09/07/2019 0903   ALBUMIN 3.7 07/11/2020 2128   ALBUMIN 3.8 09/07/2019 0903   AST 31 07/11/2020 2128   ALT 22 07/11/2020 2128   ALKPHOS 56 07/11/2020 2128   BILITOT 0.5 07/11/2020 2128   BILITOT 0.3 09/07/2019 0903   GFRNONAA >60 07/11/2020 2128   GFRAA >60 07/11/2020 2128   Lab Results  Component Value Date   CHOL 229 (H) 07/10/2020   HDL 70 07/10/2020   LDLCALC 126 (H) 07/10/2020   TRIG 163 (H) 07/10/2020   CHOLHDL 3.3 07/10/2020   No results found for: HGBA1C Lab Results  Component Value Date   VITAMINB12 166 (L) 07/10/2020   Lab Results  Component Value Date   TSH 1.610 06/23/2020      ASSESSMENT AND PLAN 69 y.o. year old female  has a past medical history of Anemia, Anxiety, Aortic atherosclerosis ([place]), Depression, GERD (gastroesophageal reflux disease), Hyperlipidemia, Hypertension, Orthostatic hypotension, and Osteoporosis. here with:  1. Facial jerks, nocturnal (chronic for 10-15 years) 2. Iron deficiency 3. Vitamin B12 deficiency 4. Alcohol Abuse (intoxicated in ER 07/11/2020) 5. Depression  -Unclear exact etiology of facial jerks, initially improved with  discontinuation of mirtazapine, but have returned, only on Celexa 20 mg daily -Her iron deficiency, B12 deficiency is being treated -May try taking Xanax 0.5 mg at bedtime (rx'ed BID) to see if benefit to facial jerks, if no benefit cut back the Xanax, try Keppra 250 mg at bedtime for myoclonus -Follow-up in 6 months or sooner if needed  I spent 30 minutes of face-to-face and non-face-to-face time with patient.  This included previsit chart review, lab review, study review, order entry, electronic health record documentation, patient education.  Butler Denmark, AGNP-C, DNP 09/16/2020, 4:16 PM [facility] [address], [place], [place] [address] [phone]

## 2020-09-17 ENCOUNTER — Encounter: Payer: Self-pay | Admitting: Cardiology

## 2020-09-17 ENCOUNTER — Ambulatory Visit: Payer: Medicare Other | Admitting: Cardiology

## 2020-09-17 ENCOUNTER — Other Ambulatory Visit: Payer: Self-pay

## 2020-09-17 VITALS — BP 147/89 | HR 67 | Resp 16 | Ht 63.0 in | Wt 146.0 lb

## 2020-09-17 DIAGNOSIS — I2584 Coronary atherosclerosis due to calcified coronary lesion: Secondary | ICD-10-CM

## 2020-09-17 DIAGNOSIS — I10 Essential (primary) hypertension: Secondary | ICD-10-CM | POA: Diagnosis not present

## 2020-09-17 DIAGNOSIS — E782 Mixed hyperlipidemia: Secondary | ICD-10-CM | POA: Diagnosis not present

## 2020-09-17 DIAGNOSIS — R0609 Other forms of dyspnea: Secondary | ICD-10-CM

## 2020-09-17 DIAGNOSIS — I6523 Occlusion and stenosis of bilateral carotid arteries: Secondary | ICD-10-CM | POA: Diagnosis not present

## 2020-09-17 DIAGNOSIS — F17200 Nicotine dependence, unspecified, uncomplicated: Secondary | ICD-10-CM

## 2020-09-17 DIAGNOSIS — R55 Syncope and collapse: Secondary | ICD-10-CM

## 2020-09-17 DIAGNOSIS — I2510 Atherosclerotic heart disease of native coronary artery without angina pectoris: Secondary | ICD-10-CM

## 2020-09-17 DIAGNOSIS — IMO0001 Reserved for inherently not codable concepts without codable children: Secondary | ICD-10-CM

## 2020-09-17 DIAGNOSIS — I700 Atherosclerosis of aorta: Secondary | ICD-10-CM

## 2020-09-17 DIAGNOSIS — R0600 Dyspnea, unspecified: Secondary | ICD-10-CM

## 2020-09-17 DIAGNOSIS — Z712 Person consulting for explanation of examination or test findings: Secondary | ICD-10-CM

## 2020-09-17 NOTE — Progress Notes (Signed)
Date:  09/18/2020   ID:  Jody Wilson, DOB 08-17-1951, MRN [id number]  PCP:  Dettinger, Fransisca Kaufmann, MD  Cardiologist:  Rex Kras, DO, [facility] (established care 07/29/2020)  Date: 09/18/20 Last Office Visit: 07/29/2020  Chief Complaint  Patient presents with   Syncope and collapse   Follow-up    6 week    Results    HPI  Jody Wilson is a 69 y.o. female who presents to the office with a chief complaint of " reevaluation of syncope and review test results." Her past medical history and cardiovascular risk factors include: hypertension, hyperlipidemia, GERD, active smoker, Thoracic aortic atherosclerosis, coronary artery calcification, advanced age, postmenopausal female.  Patient is referred to the office at the request of her primary care provider for evaluation of syncope.  During 4 July weekend patient had a syncopal event for which she was hospitalized and underwent an extensive evaluation including orthostatic vital signs, echo, carotid duplex, CT and MRI.  She was diagnosed with vasovagal syncope and was recommended to follow-up with cardiology as outpatient.  Since last office visit patient states that she has had approximately 3 other syncopal events.  After her syncopal events she had a blood pressure checked by one of her family members and her blood pressures have been trending low.  Initially she thought it was secondary to excessive alcohol consumption for which she was hospitalized at [facility].  However, despite stopping alcohol consumption for the last 3 to 4 weeks she has had two syncopal events.   Since last office visit she did undergo extended Holter monitor which noted her underlying rhythm to be sinus and she had few asymptomatic episodes of SVT.  Rhythm strips reviewed with her in great detail at today's office visit and final report pending.  Patient states that she continues to also experience dyspnea on exertion.  She is trying to increase her physical  activity but is limited by symptoms of effort related dyspnea.  She did undergo exercise Myoview stress test which reported normal myocardial perfusion ECG is positive for ischemia and she had decreased functional capacity.  She continues to smoke half a pack per day.   No family history of premature coronary disease or sudden cardiac death.  Denies prior history of coronary artery disease, myocardial infarction, congestive heart failure, deep venous thrombosis, pulmonary embolism, stroke, transient ischemic attack.  FUNCTIONAL STATUS: Walks 41min everyday 4 days a week.    ALLERGIES: Allergies  Allergen Reactions   Tetracyclines & Related Itching and Swelling    MEDICATION LIST PRIOR TO VISIT: Current Meds  Medication Sig   ALPRAZolam (XANAX) 0.5 MG tablet Take 0.5 mg by mouth at bedtime as needed for anxiety.   aspirin EC 81 MG tablet Take 1 tablet (81 mg total) by mouth daily. Swallow whole.   calcium-vitamin D (OSCAL WITH D) 500-200 MG-UNIT tablet Take 1 tablet by mouth 2 (two) times daily.    citalopram (CELEXA) 20 MG tablet Take 1 tablet (20 mg total) by mouth daily.   cyanocobalamin (,VITAMIN B-12,) 1000 MCG/ML injection Inject 1,000 mcg into the muscle once.   denosumab (PROLIA) 60 MG/ML SOSY injection Inject 60 mg into the skin every 6 (six) months.   hydrochlorothiazide (MICROZIDE) 12.5 MG capsule Take 1 capsule (12.5 mg total) by mouth daily.   levETIRAcetam (KEPPRA) 250 MG tablet Take 1 tablet (250 mg total) by mouth at bedtime.   Multiple Vitamins-Minerals (WOMENS 50+ MULTI VITAMIN/MIN PO) Take 1 tablet by mouth daily.  olmesartan (BENICAR) 40 MG tablet TAKE 1 TABLET DAILY (Patient taking differently: Take 40 mg by mouth daily. )   pantoprazole (PROTONIX) 40 MG tablet Take 1 tablet (40 mg total) by mouth daily.   pravastatin (PRAVACHOL) 20 MG tablet Take 1 tablet (20 mg total) by mouth daily.   valACYclovir (VALTREX) 1000 MG tablet Take 2 tablets (2,000 mg  total) by mouth 2 (two) times daily. For 2 days (Patient taking differently: Take 2,000 mg by mouth 2 (two) times daily as needed (Fever blisters). )   Current Facility-Administered Medications for the 09/17/20 encounter (Office Visit) with Rex Kras, DO  Medication   denosumab (PROLIA) injection 60 mg     PAST MEDICAL HISTORY: Past Medical History:  Diagnosis Date   Anemia    Anxiety    Aortic atherosclerosis ([place])    Depression    GERD (gastroesophageal reflux disease)    Hyperlipidemia    Hypertension    Orthostatic hypotension    Osteoporosis     PAST SURGICAL HISTORY: Past Surgical History:  Procedure Laterality Date   ABDOMINAL HYSTERECTOMY     BACK SURGERY     CHOLECYSTECTOMY     FOOT SURGERY     7 in the past   IR GENERIC HISTORICAL  12/09/2016   IR RADIOLOGIST EVAL & MGMT 12/09/2016 [facility]   IR GENERIC HISTORICAL  12/10/2016   IR VERTEBROPLASTY CERV/THOR BX INC UNI/BIL INC/INJECT/IMAGING 12/10/2016 [facility]   TRACHEOSTOMY TUBE PLACEMENT  1982    FAMILY HISTORY: The patient family history includes Aneurysm in her paternal uncle; Atrial fibrillation in her half-sister; Congestive Heart Failure in her mother; Diabetes in her mother; Heart attack in her maternal grandfather and maternal grandmother; Heart disease in her mother; Hyperlipidemia in her mother; Hypertension in her mother and son; Liver disease (age of onset: 25) in her son; Lupus in her half-sister; Other (age of onset: 102) in her son.  SOCIAL HISTORY:  The patient  reports that she has been smoking cigarettes. She has been smoking about 0.25 packs per day. She has never used smokeless tobacco. She reports previous alcohol use. She reports that she does not use drugs.  REVIEW OF SYSTEMS: Review of Systems  Constitutional: Negative for chills and fever.  HENT: Negative for hoarse voice and nosebleeds.   Eyes: Negative for discharge, double vision and pain.  Cardiovascular:  Positive for dyspnea on exertion and syncope (recent history). Negative for chest pain, claudication, leg swelling, near-syncope, orthopnea, palpitations and paroxysmal nocturnal dyspnea.  Respiratory: Negative for hemoptysis and shortness of breath.   Musculoskeletal: Negative for muscle cramps and myalgias.  Gastrointestinal: Negative for abdominal pain, constipation, diarrhea, hematemesis, hematochezia, melena, nausea and vomiting.  Neurological: Positive for dizziness and light-headedness.    PHYSICAL EXAM: Vitals with BMI 09/17/2020 09/16/2020 09/16/2020  Height 5\' 3"  - 5\' 3"   Weight 146 lbs - 142 lbs  BMI 06.30 - 16.01  Systolic 093 235 573  Diastolic 89 91 220  Pulse 67 66 71   CONSTITUTIONAL: Well-developed and well-nourished. No acute distress.  SKIN: Skin is warm and dry. No rash noted. No cyanosis. No pallor. No jaundice HEAD: Normocephalic and atraumatic.  EYES: No scleral icterus MOUTH/THROAT: Moist oral membranes.  NECK: No JVD present. No thyromegaly noted. No carotid bruits  LYMPHATIC: No visible cervical adenopathy.  CHEST Normal respiratory effort. No intercostal retractions  LUNGS: Clear to auscultation bilaterally. No stridor. No wheezes. No rales.  CARDIOVASCULAR: Regular rate and rhythm, positive S1-S2, no murmurs rubs  or gallops appreciated. ABDOMINAL: No apparent ascites.  EXTREMITIES: No peripheral edema  HEMATOLOGIC: No significant bruising NEUROLOGIC: Oriented to person, place, and time. Nonfocal. Normal muscle tone.  PSYCHIATRIC: Normal mood and affect. Normal behavior. Cooperative  RADIOLOGY:  MR Abdomen w/ and w/o contrast 06/03/2020:  1. Area of concern on prior chest CT represents geographic fat intensification on a background of global mild hepatic steatosis. No suspicious lesion in the liver with lobular contours. Correlate with any clinical or laboratory evidence of liver disease. 2. Small focal outpouching approximately 7 x 7 mm along the distal  thoracic aorta may represent a penetrating atherosclerotic ulcer not associated with acute features and unchanged since previous chest CT. Consider CT angiogram follow-up in 1 year with vascular surgery consultation to guide further follow-up. 3. Large hiatal hernia extending into the chest as on the recent CT of the chest. 4. Compression fracture at T11 better demonstrated on recent chest CT. 5. Aortic atherosclerosis.  CT chest lung cancer screening: 04/25/2020: Mild coronary atherosclerosis of the LAD.  CARDIAC DATABASE: EKG: 07/29/2020: Normal sinus rhythm, 79 bpm, normal axis, left atrial enlargement, without underlying ischemia or injury pattern.  Echocardiogram: 06/23/2020: LVEF 60-65%, indeterminate diastolic parameters, no pulmonary hypertension, RVSP 20 mmHg mild MR, aortic valve sclerosis without stenosis.  Stress Testing: Exercise Myoview stress test 08/13/2020: Exercise nuclear stress test was performed using Bruce protocol. Patient reached 5.0 METS, and 92% of age predicted maximum heart rate. Exercise capacity was low. Chest pain not reported. Exertional dyspnea and dizziness reported. Heart rate and hemodynamic response were normal Stress EKG demonstrated sinus tachycardia, 1.5-2 mm horizontal/upsloping ST depression in leads II, III, aVF, V5, V6, normalizing 2 min into recovery. EKG changes are positive for ischemia.  Normal myocardial perfusion. Stress LVEF 73%. Intermediate risk study due to EKG changes, low exercise capacity.   Heart Catheterization: None  Carotid duplex: 06/23/2020:  Right Carotid: Velocities in the right ICA are consistent with a 1-39% stenosis. Left Carotid: Velocities in the left ICA are consistent with a 1-39% stenosis. Vertebrals: Bilateral vertebral arteries demonstrate antegrade flow.   LABORATORY DATA: CBC Latest Ref Rng & Units 07/11/2020 07/10/2020 06/23/2020  WBC 4.0 - 10.5 K/uL 8.2 7.2 6.4  Hemoglobin 12.0 - 15.0 g/dL 16.0(H) 16.5(H) 16.3(H)    Hematocrit 36 - 46 % 46.4(H) 48.4(H) 46.9(H)  Platelets 150 - 400 K/uL 265 244 263    CMP Latest Ref Rng & Units 07/11/2020 07/10/2020 06/24/2020  Glucose 70 - 99 mg/dL 95 108(H) 108(H)  BUN 8 - 23 mg/dL 16 17 11   Creatinine 0.44 - 1.00 mg/dL 0.61 0.57 0.60  Sodium 135 - 145 mmol/L 137 137 139  Potassium 3.5 - 5.1 mmol/L 3.5 4.0 3.6  Chloride 98 - 111 mmol/L 103 102 103  CO2 22 - 32 mmol/L 23 25 24   Calcium 8.9 - 10.3 mg/dL 9.0 8.6(L) 8.7(L)  Total Protein 6.5 - 8.1 g/dL 6.9 7.0 -  Total Bilirubin 0.3 - 1.2 mg/dL 0.5 1.2 -  Alkaline Phos 38 - 126 U/L 56 61 -  AST 15 - 41 U/L 31 24 -  ALT 0 - 44 U/L 22 20 -    Lipid Panel     Component Value Date/Time   CHOL 229 (H) 07/10/2020 1327   CHOL 207 (H) 05/30/2019 0912   TRIG 163 (H) 07/10/2020 1327   TRIG 193 (H) 10/17/2014 1249   HDL 70 07/10/2020 1327   HDL 52 05/30/2019 0912   HDL 47 10/17/2014 1249   CHOLHDL  3.3 07/10/2020 1327   VLDL 33 07/10/2020 1327   LDLCALC 126 (H) 07/10/2020 1327   LDLCALC 113 (H) 05/30/2019 0912   LABVLDL 42 (H) 05/30/2019 0912    No components found for: NTPROBNP No results for input(s): PROBNP in the last 8760 hours. Recent Labs    09/25/19 1442 06/23/20 0232  TSH 0.884 1.610    BMP Recent Labs    06/24/20 0845 07/10/20 1327 07/11/20 2128  NA 139 137 137  K 3.6 4.0 3.5  CL 103 102 103  CO2 24 25 23   GLUCOSE 108* 108* 95  BUN 11 17 16   CREATININE 0.60 0.57 0.61  CALCIUM 8.7* 8.6* 9.0  GFRNONAA >60 >60 >60  GFRAA >60 >60 >60    HEMOGLOBIN A1C No results found for: HGBA1C, MPG  IMPRESSION:    ICD-10-CM   1. Dyspnea on exertion  T26.71 BASIC METABOLIC PANEL WITH GFR    CBC    Lipid Panel With LDL/HDL Ratio    Pro b natriuretic peptide (BNP)    SARS-COV-2 RNA,(COVID-19) QUAL NAAT  2. Syncope and collapse  R55   3. Essential hypertension  I10   4. Mixed hyperlipidemia  E78.2   5. Smoking  F17.200   6. Aortic atherosclerosis ([place]), CT Chest 04/25/2020  I70.0   7. Coronary  atherosclerosis due to calcified coronary lesion of native artery  I25.10    I25.84   8. Atherosclerosis of both carotid arteries  I65.23   9. Encounter to discuss test results  Z71.2      RECOMMENDATIONS: Jody Wilson is a 69 y.o. female whose past medical history and cardiac risk factors include:  hypertension, hyperlipidemia, GERD, active smoker, Thoracic aortic atherosclerosis, coronary artery calcification, advanced age, postmenopausal female.  Dyspnea on exertion: Since last office visit patient symptoms of effort related dyspnea continue to be persistent and affecting her ability to increase her physical activity on a daily basis.  In addition she has multiple cardiovascular risk factors and recurrent syncopal events.  She is noted to have coronary calcification in the LAD distribution, prolonged history of smoking, as well as aortic atherosclerosis.  We discussed evaluating for obstructive coronary artery disease as her nuclear stress test was noted to be intermediate risk study due to ischemic ECG changes, decreased overall functional capacity, but myocardial perfusion reported to be normal.  We discussed undergoing either coronary CTA or left heart catheterization with possible intervention both of the studies were discussed with the patient.  Patient like to proceed with left heart catheterization with possible intervention given her continued symptoms of dyspnea on exertion and syncopal events. The procedure of left heart catheterization with possible intervention was explained to the patient in detail.  The indication, alternatives, risks and benefits were reviewed.  Complications include but not limited to bleeding, infection, vascular injury, stroke, myocardial infection, arrhythmia, kidney injury requiring hemodialysis, radiation-related injury in the case of prolonged fluoroscopy use, emergency cardiac surgery, and death. The patient understands the risks of serious complication is  1-2 in 2458 with diagnostic cardiac cath and 1-2% or less with angioplasty/stenting. The patient voices understanding and provides verbal feedback and wishes to proceed with coronary angiography with possible PCI. Check BMP, CBC, BNP and screen for COVID-19 infection prior to angiography.  Syncope: Patient was diagnosed with vasovagal syncope during her recent hospitalization in July 2021. 14-day extended Holter monitor results notes her underlying rhythm to be normal sinus without evidence of any significant dysrhythmias.  Patient is educated on the importance  of decreasing alcohol consumption as this may lead to her intravascular depletion and vasovagal syncope.  Patient is currently also seeing neurology for the management of syncope as well.  Since the episodes of syncope have reoccurred I recommend that the patient refrain from driving or operating machinery for 6 months.  She verbalizes understanding.  Patient is asked to consume 3 balanced meals and keeping herself well-hydrated.  She may also consider using compression stockings.  Coronary artery calcification: Continue statin therapy and aspirin.  Smoking: Educated on the importance of complete smoking cessation.  FINAL MEDICATION LIST END OF ENCOUNTER: No orders of the defined types were placed in this encounter.   There are no discontinued medications. d  Current Outpatient Medications:    ALPRAZolam (XANAX) 0.5 MG tablet, Take 0.5 mg by mouth at bedtime as needed for anxiety., Disp: , Rfl:    aspirin EC 81 MG tablet, Take 1 tablet (81 mg total) by mouth daily. Swallow whole., Disp: 90 tablet, Rfl: 3   calcium-vitamin D (OSCAL WITH D) 500-200 MG-UNIT tablet, Take 1 tablet by mouth 2 (two) times daily. , Disp: , Rfl:    citalopram (CELEXA) 20 MG tablet, Take 1 tablet (20 mg total) by mouth daily., Disp: 90 tablet, Rfl: 3   cyanocobalamin (,VITAMIN B-12,) 1000 MCG/ML injection, Inject 1,000 mcg into the muscle once., Disp: , Rfl:      denosumab (PROLIA) 60 MG/ML SOSY injection, Inject 60 mg into the skin every 6 (six) months., Disp: 2 mL, Rfl: 0   hydrochlorothiazide (MICROZIDE) 12.5 MG capsule, Take 1 capsule (12.5 mg total) by mouth daily., Disp: 90 capsule, Rfl: 3   levETIRAcetam (KEPPRA) 250 MG tablet, Take 1 tablet (250 mg total) by mouth at bedtime., Disp: 30 tablet, Rfl: 3   Multiple Vitamins-Minerals (WOMENS 50+ MULTI VITAMIN/MIN PO), Take 1 tablet by mouth daily. , Disp: , Rfl:    olmesartan (BENICAR) 40 MG tablet, TAKE 1 TABLET DAILY (Patient taking differently: Take 40 mg by mouth daily. ), Disp: 90 tablet, Rfl: 0   pantoprazole (PROTONIX) 40 MG tablet, Take 1 tablet (40 mg total) by mouth daily., Disp: 90 tablet, Rfl: 3   pravastatin (PRAVACHOL) 20 MG tablet, Take 1 tablet (20 mg total) by mouth daily., Disp: 90 tablet, Rfl: 1   valACYclovir (VALTREX) 1000 MG tablet, Take 2 tablets (2,000 mg total) by mouth 2 (two) times daily. For 2 days (Patient taking differently: Take 2,000 mg by mouth 2 (two) times daily as needed (Fever blisters). ), Disp: 8 tablet, Rfl: 10  Current Facility-Administered Medications:    denosumab (PROLIA) injection 60 mg, 60 mg, Subcutaneous, Q6 months, Dettinger, Fransisca Kaufmann, MD, 60 mg at 11/14/19 1553  Orders Placed This Encounter  Procedures   SARS-COV-2 RNA,(COVID-19) QUAL NAAT   BASIC METABOLIC PANEL WITH GFR   CBC   Lipid Panel With LDL/HDL Ratio   Pro b natriuretic peptide (BNP)    There are no Patient Instructions on file for this visit.   --Continue cardiac medications as reconciled in final medication list. We have gone to stop smoking and landing --Return in about 4 weeks (around 10/15/2020) for Post heart catheterization. Or sooner if needed. --Continue follow-up with your primary care physician regarding the management of your other chronic comorbid conditions.  Patient's questions and concerns were addressed to her satisfaction. She voices understanding of  the instructions provided during this encounter.   This note was created using a voice recognition software as a result there may be grammatical  errors inadvertently enclosed that do not reflect the nature of this encounter. Every attempt is made to correct such errors.  Rex Kras, [place], [facility]  Pager: [phone] Office: [phone]

## 2020-09-23 ENCOUNTER — Other Ambulatory Visit: Payer: Self-pay | Admitting: Cardiology

## 2020-09-23 DIAGNOSIS — E782 Mixed hyperlipidemia: Secondary | ICD-10-CM | POA: Diagnosis not present

## 2020-09-23 DIAGNOSIS — R0600 Dyspnea, unspecified: Secondary | ICD-10-CM | POA: Diagnosis not present

## 2020-09-24 LAB — CBC
Hematocrit: 48.6 % — ABNORMAL HIGH (ref 34.0–46.6)
Hemoglobin: 17.2 g/dL — ABNORMAL HIGH (ref 11.1–15.9)
MCH: 36.3 pg — ABNORMAL HIGH (ref 26.6–33.0)
MCHC: 35.4 g/dL (ref 31.5–35.7)
MCV: 103 fL — ABNORMAL HIGH (ref 79–97)
Platelets: 316 10*3/uL (ref 150–450)
RBC: 4.74 x10E6/uL (ref 3.77–5.28)
RDW: 11.7 % (ref 11.7–15.4)
WBC: 9.1 10*3/uL (ref 3.4–10.8)

## 2020-09-24 LAB — LIPID PANEL WITH LDL/HDL RATIO
Cholesterol, Total: 190 mg/dL (ref 100–199)
HDL: 66 mg/dL (ref >39)
LDL Chol Calc (NIH): 88 mg/dL (ref 0–99)
LDL/HDL Ratio: 1.3 ratio (ref 0.0–3.2)
Triglycerides: 218 mg/dL — ABNORMAL HIGH (ref 0–149)
VLDL Cholesterol Cal: 36 mg/dL (ref 5–40)

## 2020-09-24 LAB — BASIC METABOLIC PANEL
BUN/Creatinine Ratio: 22 (ref 12–28)
BUN: 13 mg/dL (ref 8–27)
CO2: 28 mmol/L (ref 20–29)
Calcium: 9.6 mg/dL (ref 8.7–10.3)
Chloride: 101 mmol/L (ref 96–106)
Creatinine, Ser: 0.6 mg/dL (ref 0.57–1.00)
GFR calc Af Amer: 108 mL/min/{1.73_m2} (ref >59)
GFR calc non Af Amer: 94 mL/min/{1.73_m2} (ref >59)
Glucose: 98 mg/dL (ref 65–99)
Potassium: 3.9 mmol/L (ref 3.5–5.2)
Sodium: 141 mmol/L (ref 134–144)

## 2020-09-24 LAB — SPECIMEN STATUS REPORT

## 2020-09-24 LAB — PRO B NATRIURETIC PEPTIDE: NT-Pro BNP: 47 pg/mL (ref 0–301)

## 2020-09-27 ENCOUNTER — Other Ambulatory Visit: Payer: Self-pay | Admitting: Internal Medicine

## 2020-09-27 ENCOUNTER — Other Ambulatory Visit (HOSPITAL_COMMUNITY): Payer: Self-pay

## 2020-09-27 ENCOUNTER — Other Ambulatory Visit: Payer: Medicare Other

## 2020-09-27 DIAGNOSIS — Z20822 Contact with and (suspected) exposure to covid-19: Secondary | ICD-10-CM | POA: Diagnosis not present

## 2020-09-29 DIAGNOSIS — R9439 Abnormal result of other cardiovascular function study: Secondary | ICD-10-CM

## 2020-09-29 DIAGNOSIS — R0600 Dyspnea, unspecified: Secondary | ICD-10-CM | POA: Diagnosis present

## 2020-09-29 DIAGNOSIS — R0609 Other forms of dyspnea: Secondary | ICD-10-CM | POA: Diagnosis present

## 2020-09-29 LAB — SARS-COV-2, NAA 2 DAY TAT

## 2020-09-29 LAB — NOVEL CORONAVIRUS, NAA: SARS-CoV-2, NAA: NOT DETECTED

## 2020-09-30 ENCOUNTER — Encounter (HOSPITAL_COMMUNITY)
Admission: RE | Disposition: A | Discharge status: Home / Self Care | Payer: Self-pay | Source: Home / Self Care | Attending: Cardiology

## 2020-09-30 ENCOUNTER — Ambulatory Visit (HOSPITAL_COMMUNITY)
Admission: RE | Admit: 2020-09-30 | Discharge: 2020-09-30 | Disposition: A | Discharge status: Home / Self Care | Payer: Medicare Other | Attending: Cardiology | Admitting: Cardiology

## 2020-09-30 ENCOUNTER — Other Ambulatory Visit: Payer: Self-pay

## 2020-09-30 DIAGNOSIS — I10 Essential (primary) hypertension: Secondary | ICD-10-CM | POA: Insufficient documentation

## 2020-09-30 DIAGNOSIS — F329 Major depressive disorder, single episode, unspecified: Secondary | ICD-10-CM | POA: Insufficient documentation

## 2020-09-30 DIAGNOSIS — I700 Atherosclerosis of aorta: Secondary | ICD-10-CM | POA: Diagnosis not present

## 2020-09-30 DIAGNOSIS — R0609 Other forms of dyspnea: Secondary | ICD-10-CM | POA: Diagnosis not present

## 2020-09-30 DIAGNOSIS — F17210 Nicotine dependence, cigarettes, uncomplicated: Secondary | ICD-10-CM | POA: Diagnosis not present

## 2020-09-30 DIAGNOSIS — R9439 Abnormal result of other cardiovascular function study: Secondary | ICD-10-CM

## 2020-09-30 DIAGNOSIS — E785 Hyperlipidemia, unspecified: Secondary | ICD-10-CM | POA: Diagnosis not present

## 2020-09-30 DIAGNOSIS — R0600 Dyspnea, unspecified: Secondary | ICD-10-CM | POA: Diagnosis present

## 2020-09-30 DIAGNOSIS — Z7982 Long term (current) use of aspirin: Secondary | ICD-10-CM | POA: Insufficient documentation

## 2020-09-30 DIAGNOSIS — K219 Gastro-esophageal reflux disease without esophagitis: Secondary | ICD-10-CM | POA: Diagnosis not present

## 2020-09-30 DIAGNOSIS — Z79899 Other long term (current) drug therapy: Secondary | ICD-10-CM | POA: Insufficient documentation

## 2020-09-30 DIAGNOSIS — F419 Anxiety disorder, unspecified: Secondary | ICD-10-CM | POA: Diagnosis not present

## 2020-09-30 HISTORY — PX: LEFT HEART CATH AND CORONARY ANGIOGRAPHY: CATH118249

## 2020-09-30 SURGERY — LEFT HEART CATH AND CORONARY ANGIOGRAPHY
Anesthesia: LOCAL

## 2020-09-30 MED ORDER — VERAPAMIL HCL 2.5 MG/ML IV SOLN
INTRAVENOUS | Status: AC
  Filled 2020-09-30: qty 2

## 2020-09-30 MED ORDER — LABETALOL HCL 5 MG/ML IV SOLN: 10.0000 mg | INTRAVENOUS | Status: DC | PRN

## 2020-09-30 MED ORDER — ACETAMINOPHEN 325 MG PO TABS: 650.0000 mg | ORAL_TABLET | ORAL | Status: DC | PRN

## 2020-09-30 MED ORDER — SODIUM CHLORIDE 0.9% FLUSH: 3.0000 mL | INTRAVENOUS | Status: DC | PRN

## 2020-09-30 MED ORDER — SODIUM CHLORIDE 0.9 % WEIGHT BASED INFUSION: 1.0000 mL/kg/h | INTRAVENOUS | Status: DC

## 2020-09-30 MED ORDER — SODIUM CHLORIDE 0.9% FLUSH: 3.0000 mL | Freq: Two times a day (BID) | INTRAVENOUS | Status: DC

## 2020-09-30 MED ORDER — MIDAZOLAM HCL 2 MG/2ML IJ SOLN
INTRAMUSCULAR | Status: AC
  Filled 2020-09-30: qty 2

## 2020-09-30 MED ORDER — MIDAZOLAM HCL 2 MG/2ML IJ SOLN
INTRAMUSCULAR | Status: DC | PRN
  Administered 2020-09-30: 1 mg via INTRAVENOUS

## 2020-09-30 MED ORDER — FENTANYL CITRATE (PF) 100 MCG/2ML IJ SOLN
INTRAMUSCULAR | Status: AC
  Filled 2020-09-30: qty 2

## 2020-09-30 MED ORDER — LIDOCAINE HCL (PF) 1 % IJ SOLN
INTRAMUSCULAR | Status: AC
  Filled 2020-09-30: qty 30

## 2020-09-30 MED ORDER — SODIUM CHLORIDE 0.9 % WEIGHT BASED INFUSION
3.0000 mL/kg/h | INTRAVENOUS | Status: AC
  Administered 2020-09-30: 3 mL/kg/h via INTRAVENOUS

## 2020-09-30 MED ORDER — HEPARIN (PORCINE) IN NACL 1000-0.9 UT/500ML-% IV SOLN
INTRAVENOUS | Status: DC | PRN
  Administered 2020-09-30 (×2): 500 mL

## 2020-09-30 MED ORDER — HEPARIN SODIUM (PORCINE) 1000 UNIT/ML IJ SOLN
INTRAMUSCULAR | Status: DC | PRN
  Administered 2020-09-30: 3500 [IU] via INTRAVENOUS

## 2020-09-30 MED ORDER — HEPARIN SODIUM (PORCINE) 1000 UNIT/ML IJ SOLN
INTRAMUSCULAR | Status: AC
  Filled 2020-09-30: qty 1

## 2020-09-30 MED ORDER — LIDOCAINE HCL (PF) 1 % IJ SOLN
INTRAMUSCULAR | Status: DC | PRN
  Administered 2020-09-30: 2 mL

## 2020-09-30 MED ORDER — ASPIRIN 81 MG PO CHEW
81.0000 mg | CHEWABLE_TABLET | ORAL | Status: AC
  Administered 2020-09-30: 81 mg via ORAL
  Filled 2020-09-30: qty 1

## 2020-09-30 MED ORDER — SODIUM CHLORIDE 0.9 % IV SOLN: 250.0000 mL | INTRAVENOUS | Status: DC | PRN

## 2020-09-30 MED ORDER — ONDANSETRON HCL 4 MG/2ML IJ SOLN: 4.0000 mg | Freq: Four times a day (QID) | INTRAMUSCULAR | Status: DC | PRN

## 2020-09-30 MED ORDER — IOHEXOL 350 MG/ML SOLN
INTRAVENOUS | Status: DC | PRN
  Administered 2020-09-30: 15 mL

## 2020-09-30 MED ORDER — VERAPAMIL HCL 2.5 MG/ML IV SOLN
INTRAVENOUS | Status: DC | PRN
  Administered 2020-09-30: 10 mL via INTRA_ARTERIAL

## 2020-09-30 MED ORDER — HYDRALAZINE HCL 20 MG/ML IJ SOLN: 10.0000 mg | INTRAMUSCULAR | Status: DC | PRN

## 2020-09-30 MED ORDER — SODIUM CHLORIDE 0.9 % IV SOLN: INTRAVENOUS | Status: AC

## 2020-09-30 MED ORDER — FENTANYL CITRATE (PF) 100 MCG/2ML IJ SOLN
INTRAMUSCULAR | Status: DC | PRN
Start: 2020-09-30 — End: 2020-09-30
  Administered 2020-09-30: 50 ug via INTRAVENOUS

## 2020-09-30 SURGICAL SUPPLY — 9 items
CATH OPTITORQUE TIG 4.0 5F (CATHETERS) ×2 IMPLANT
DEVICE RAD COMP TR BAND LRG (VASCULAR PRODUCTS) ×2 IMPLANT
GLIDESHEATH SLEND A-KIT 6F 22G (SHEATH) ×2 IMPLANT
GUIDEWIRE INQWIRE 1.5J.035X260 (WIRE) ×1 IMPLANT
INQWIRE 1.5J .035X260CM (WIRE) ×2
KIT HEART LEFT (KITS) ×2 IMPLANT
PACK CARDIAC CATHETERIZATION (CUSTOM PROCEDURE TRAY) ×2 IMPLANT
TRANSDUCER W/STOPCOCK (MISCELLANEOUS) ×2 IMPLANT
TUBING CIL FLEX 10 FLL-RA (TUBING) ×2 IMPLANT

## 2020-09-30 NOTE — Discharge Instructions (Signed)
Radial Site Care  This sheet gives you information about how to care for yourself after your procedure. Your health care provider may also give you more specific instructions. If you have problems or questions, contact your health care provider. What can I expect after the procedure? After the procedure, it is common to have:  Bruising and tenderness at the catheter insertion area. Follow these instructions at home: Medicines  Take over-the-counter and prescription medicines only as told by your health care provider. Insertion site care  Follow instructions from your health care provider about how to take care of your insertion site. Make sure you: ? Wash your hands with soap and water before you change your bandage (dressing). If soap and water are not available, use hand sanitizer. ? Change your dressing as told by your health care provider. ? Leave stitches (sutures), skin glue, or adhesive strips in place. These skin closures may need to stay in place for 2 weeks or longer. If adhesive strip edges start to loosen and curl up, you may trim the loose edges. Do not remove adhesive strips completely unless your health care provider tells you to do that.  Check your insertion site every day for signs of infection. Check for: ? Redness, swelling, or pain. ? Fluid or blood. ? Pus or a bad smell. ? Warmth.  Do not take baths, swim, or use a hot tub until your health care provider approves.  You may shower 24-48 hours after the procedure, or as directed by your health care provider. ? Remove the dressing and gently wash the site with plain soap and water. ? Pat the area dry with a clean towel. ? Do not rub the site. That could cause bleeding.  Do not apply powder or lotion to the site. Activity   For 24 hours after the procedure, or as directed by your health care provider: ? Do not flex or bend the affected arm. ? Do not push or pull heavy objects with the affected arm. ? Do not  drive yourself home from the hospital or clinic. You may drive 24 hours after the procedure unless your health care provider tells you not to. ? Do not operate machinery or power tools.  Do not lift anything that is heavier than 10 lb (4.5 kg), or the limit that you are told, until your health care provider says that it is safe.  Ask your health care provider when it is okay to: ? Return to work or school. ? Resume usual physical activities or sports. ? Resume sexual activity. General instructions  If the catheter site starts to bleed, raise your arm and put firm pressure on the site. If the bleeding does not stop, get help right away. This is a medical emergency.  If you went home on the same day as your procedure, a responsible adult should be with you for the first 24 hours after you arrive home.  Keep all follow-up visits as told by your health care provider. This is important. Contact a health care provider if:  You have a fever.  You have redness, swelling, or yellow drainage around your insertion site. Get help right away if:  You have unusual pain at the radial site.  The catheter insertion area swells very fast.  The insertion area is bleeding, and the bleeding does not stop when you hold steady pressure on the area.  Your arm or hand becomes pale, cool, tingly, or numb. These symptoms may represent a serious problem   that is an emergency. Do not wait to see if the symptoms will go away. Get medical help right away. Call your local emergency services (911 in the [place] Do not drive yourself to the hospital. Summary  After the procedure, it is common to have bruising and tenderness at the site.  Follow instructions from your health care provider about how to take care of your radial site wound. Check the wound every day for signs of infection.  Do not lift anything that is heavier than 10 lb (4.5 kg), or the limit that you are told, until your health care provider says  that it is safe. This information is not intended to replace advice given to you by your health care provider. Make sure you discuss any questions you have with your health care provider. Document Revised: 01/11/2018 Document Reviewed: 01/11/2018 Elsevier Patient Education  2020 [employer]  

## 2020-09-30 NOTE — Progress Notes (Signed)
Pt's husband and sister do not do texting. Did not sign them up for text updates.

## 2020-09-30 NOTE — Interval H&P Note (Signed)
History and Physical Interval Note:  09/30/2020 11:49 AM  Jody Wilson  has presented today for surgery, with the diagnosis of positive stress test.  The various methods of treatment have been discussed with the patient and family. After consideration of risks, benefits and other options for treatment, the patient has consented to  Procedure(s): LEFT HEART CATH AND CORONARY ANGIOGRAPHY (N/A) as a surgical intervention.  The patient's history has been reviewed, patient examined, no change in status, stable for surgery.  I have reviewed the patient's chart and labs.  Questions were answered to the patient's satisfaction.    016/2017 Appropriate Use Criteria for Coronary Revascularization Clinical Presentation: Diabetes Mellitus? Symptom Status? S/P CABG? Antianginal Therapy (# of long-acting drugs)? Results of Non-invasive testing? FFR/iFR results in all diseased vessels? Patient undergoing renal transplant? Patient undergoing percutaneous valve procedure (TAVR, MitraClip, Others)? Symptom Status:  Ischemic Symptoms  Non-invasive Testing:  Intermediate Risk  If no or indeterminate stress test, FFR/iFR results in all diseased vessels:  N/A  Diabetes Mellitus:  No  S/P CABG:  No  Antianginal therapy (number of long-acting drugs):  0  Patient undergoing renal transplant:  No  Patient undergoing percutaneous valve procedure:  No    newline 1 Vessel Disease PCI CABG  No proximal LAD involvement, No proximal left dominant LCX involvement M (5); Indication 2 M (4); Indication 2   Proximal left dominant LCX involvement M (6); Indication 5 M (6); Indication 5   Proximal LAD involvement M (6); Indication 5 M (6); Indication 5   newline 2 Vessel Disease  No proximal LAD involvement M (6); Indication 8 M (5); Indication 8   Proximal LAD involvement A (7); Indication 11 A (7); Indication 11   newline 3 Vessel Disease  Low disease complexity (e.g., focal stenoses, SYNTAX <=22) A (7); Indication  17 A (7); Indication 17   Intermediate or high disease complexity (e.g., SYNTAX >=23) M (6); Indication 21 A (7); Indication 21   newline Left Main Disease  Isolated LMCA disease: ostial or midshaft A (7); Indication 24 A (8); Indication 24   Isolated LMCA disease: bifurcation involvement M (5); Indication 25 A (8); Indication 25   LMCA ostial or midshaft, concurrent low disease burden multivessel disease (e.g., 1-2 additional focal stenoses, SYNTAX <=22) M (6); Indication 26 A (9); Indication 26   LMCA ostial or midshaft, concurrent intermediate or high disease burden multivessel disease (e.g., 1-2 additional bifurcation stenoses, long stenoses, SYNTAX >=23) M (4); Indication 27 A (9); Indication 27   LMCA bifurcation involvement, concurrent low disease burden multivessel disease (e.g., 1-2 additional focal stenoses, SYNTAX <=22) M (5); Indication 28 A (8); Indication 28   LMCA bifurcation involvement, concurrent intermediate or high disease burden multivessel disease (e.g., 1-2 additional bifurcation stenoses, long stenoses, SYNTAX >=23) R (3); Indication 29 A (9); Indication [place]

## 2020-09-30 NOTE — H&P (Addendum)
09/17/2020 copied for documentation     Date:  09/30/2020   ID:  Jody Wilson, DOB Jul 29, 1951, MRN [id number]  PCP:  Dettinger, Fransisca Kaufmann, MD  Cardiologist: Rex Kras, DO, [facility] (established care 07/29/2020)  Date: 09/18/20 Last Office Visit: 07/29/2020  No chief complaint on file.   HPI  Jody Wilson is a 69 y.o. female who presents to the office with a chief complaint of " reevaluation of syncope and review test results." Her past medical history and cardiovascular risk factors include: hypertension, hyperlipidemia, GERD, active smoker, Thoracic aortic atherosclerosis, coronary artery calcification, advanced age, postmenopausal female.  Patient is referred to the office at the request of her primary care provider for evaluation of syncope.  During 4 July weekend patient had a syncopal event for which she was hospitalized and underwent an extensive evaluation including orthostatic vital signs, echo, carotid duplex, CT and MRI.  She was diagnosed with vasovagal syncope and was recommended to follow-up with cardiology as outpatient.  Since last office visit patient states that she has had approximately 3 other syncopal events.  After her syncopal events she had a blood pressure checked by one of her family members and her blood pressures have been trending low.  Initially she thought it was secondary to excessive alcohol consumption for which she was hospitalized at [facility].  However, despite stopping alcohol consumption for the last 3 to 4 weeks she has had two syncopal events.   Since last office visit she did undergo extended Holter monitor which noted her underlying rhythm to be sinus and she had few asymptomatic episodes of SVT.  Rhythm strips reviewed with her in great detail at today's office visit and final report pending.  Patient states that she continues to also experience dyspnea on exertion.  She is trying to increase her physical activity but is limited by symptoms of  effort related dyspnea.  She did undergo exercise Myoview stress test which reported normal myocardial perfusion ECG is positive for ischemia and she had decreased functional capacity.  She continues to smoke half a pack per day.   No family history of premature coronary disease or sudden cardiac death.  Denies prior history of coronary artery disease, myocardial infarction, congestive heart failure, deep venous thrombosis, pulmonary embolism, stroke, transient ischemic attack.  FUNCTIONAL STATUS: Walks 104min everyday 4 days a week.    ALLERGIES: Allergies  Allergen Reactions  . Tetracyclines & Related Itching and Swelling    MEDICATION LIST PRIOR TO VISIT: Current Facility-Administered Medications for the 09/30/20 encounter [facility] Encounter)  Medication  . denosumab (PROLIA) injection 60 mg   Current Meds  Medication Sig  . ALPRAZolam (XANAX) 0.5 MG tablet Take 0.5 mg by mouth 2 (two) times daily as needed for anxiety.   Marland Kitchen aspirin EC 81 MG tablet Take 1 tablet (81 mg total) by mouth daily. Swallow whole.  . calcium-vitamin D (OSCAL WITH D) 500-200 MG-UNIT tablet Take 1 tablet by mouth daily with lunch.   . citalopram (CELEXA) 20 MG tablet Take 1 tablet (20 mg total) by mouth daily.  . cyanocobalamin (,VITAMIN B-12,) 1000 MCG/ML injection Inject 1,000 mcg into the muscle every 28 (twenty-eight) days.   . folic acid (FOLVITE) 1 MG tablet Take 1 mg by mouth daily.  . hydrochlorothiazide (MICROZIDE) 12.5 MG capsule Take 1 capsule (12.5 mg total) by mouth daily.  . Multiple Vitamins-Minerals (WOMENS 50+ MULTI VITAMIN/MIN PO) Take 1 tablet by mouth every Monday, Tuesday, Wednesday, Thursday, and Friday.   Marland Kitchen  olmesartan (BENICAR) 40 MG tablet TAKE 1 TABLET DAILY (Patient taking differently: Take 40 mg by mouth daily. )  . pantoprazole (PROTONIX) 40 MG tablet Take 1 tablet (40 mg total) by mouth daily.  . pravastatin (PRAVACHOL) 20 MG tablet Take 1 tablet (20 mg total) by mouth daily.  (Patient taking differently: Take 20 mg by mouth at bedtime. )     PAST MEDICAL HISTORY: Past Medical History:  Diagnosis Date  . Anemia   . Anxiety   . Aortic atherosclerosis ([place])   . Depression   . GERD (gastroesophageal reflux disease)   . Hyperlipidemia   . Hypertension   . Orthostatic hypotension   . Osteoporosis     PAST SURGICAL HISTORY: Past Surgical History:  Procedure Laterality Date  . ABDOMINAL HYSTERECTOMY    . BACK SURGERY    . CHOLECYSTECTOMY    . FOOT SURGERY     7 in the past  . IR GENERIC HISTORICAL  12/09/2016   IR RADIOLOGIST EVAL & MGMT 12/09/2016 [facility]-INTERV RAD  . IR GENERIC HISTORICAL  12/10/2016   IR VERTEBROPLASTY CERV/THOR BX INC UNI/BIL INC/INJECT/IMAGING 12/10/2016 [facility]INTER[facility] RAD  . TRACHEOSTOMY TUBE PLACEMENT  1982    FAMILY HISTORY: The patient family history includes Aneurysm in her paternal uncle; Atrial fibrillation in her half-sister; Congestive Heart Failure in her mother; Diabetes in her mother; Heart attack in her maternal grandfather and maternal grandmother; Heart disease in her mother; Hyperlipidemia in her mother; Hypertension in her mother and son; Liver disease (age of onset: 48) in her son; Lupus in her half-sister; Other (age of onset: 64) in her son.  SOCIAL HISTORY:  The patient  reports that she has been smoking cigarettes. She has been smoking about 0.25 packs per day. She has never used smokeless tobacco. She reports previous alcohol use. She reports that she does not use drugs.  REVIEW OF SYSTEMS: Review of Systems  Constitutional: Negative for chills and fever.  HENT: Negative for hoarse voice and nosebleeds.   Eyes: Negative for discharge, double vision and pain.  Cardiovascular: Positive for dyspnea on exertion and syncope (recent history). Negative for chest pain, claudication, leg swelling, near-syncope, orthopnea, palpitations and paroxysmal nocturnal dyspnea.  Respiratory: Negative for hemoptysis and shortness of  breath.   Musculoskeletal: Negative for muscle cramps and myalgias.  Gastrointestinal: Negative for abdominal pain, constipation, diarrhea, hematemesis, hematochezia, melena, nausea and vomiting.  Neurological: Positive for dizziness and light-headedness.    PHYSICAL EXAM: Vitals with BMI 09/30/2020 09/17/2020 09/16/2020  Height 5\' 3"  5\' 3"  -  Weight 144 lbs 146 lbs -  BMI 62.69 48.54 -  Systolic 627 035 009  Diastolic 80 89 91  Pulse 62 67 66   CONSTITUTIONAL: Well-developed and well-nourished. No acute distress.  SKIN: Skin is warm and dry. No rash noted. No cyanosis. No pallor. No jaundice HEAD: Normocephalic and atraumatic.  EYES: No scleral icterus MOUTH/THROAT: Moist oral membranes.  NECK: No JVD present. No thyromegaly noted. No carotid bruits  LYMPHATIC: No visible cervical adenopathy.  CHEST Normal respiratory effort. No intercostal retractions  LUNGS: Clear to auscultation bilaterally. No stridor. No wheezes. No rales.  CARDIOVASCULAR: Regular rate and rhythm, positive S1-S2, no murmurs rubs or gallops appreciated. ABDOMINAL: No apparent ascites.  EXTREMITIES: No peripheral edema  HEMATOLOGIC: No significant bruising NEUROLOGIC: Oriented to person, place, and time. Nonfocal. Normal muscle tone.  PSYCHIATRIC: Normal mood and affect. Normal behavior. Cooperative  RADIOLOGY:  MR Abdomen w/ and w/o contrast 06/03/2020:  1. Area of concern  on prior chest CT represents geographic fat intensification on a background of global mild hepatic steatosis. No suspicious lesion in the liver with lobular contours. Correlate with any clinical or laboratory evidence of liver disease. 2. Small focal outpouching approximately 7 x 7 mm along the distal thoracic aorta may represent a penetrating atherosclerotic ulcer not associated with acute features and unchanged since previous chest CT. Consider CT angiogram follow-up in 1 year with vascular surgery consultation to guide further follow-up. 3.  Large hiatal hernia extending into the chest as on the recent CT of the chest. 4. Compression fracture at T11 better demonstrated on recent chest CT. 5. Aortic atherosclerosis.  CT chest lung cancer screening: 04/25/2020: Mild coronary atherosclerosis of the LAD.  CARDIAC DATABASE: EKG: 07/29/2020: Normal sinus rhythm, 79 bpm, normal axis, left atrial enlargement, without underlying ischemia or injury pattern.  Echocardiogram: 06/23/2020: LVEF 60-65%, indeterminate diastolic parameters, no pulmonary hypertension, RVSP 20 mmHg mild MR, aortic valve sclerosis without stenosis.  Stress Testing: Exercise Myoview stress test 08/13/2020: Exercise nuclear stress test was performed using Bruce protocol. Patient reached 5.0 METS, and 92% of age predicted maximum heart rate. Exercise capacity was low. Chest pain not reported. Exertional dyspnea and dizziness reported. Heart rate and hemodynamic response were normal Stress EKG demonstrated sinus tachycardia, 1.5-2 mm horizontal/upsloping ST depression in leads II, III, aVF, V5, V6, normalizing 2 min into recovery. EKG changes are positive for ischemia.  Normal myocardial perfusion. Stress LVEF 73%. Intermediate risk study due to EKG changes, low exercise capacity.   Heart Catheterization: None  Carotid duplex: 06/23/2020:  Right Carotid: Velocities in the right ICA are consistent with a 1-39% stenosis. Left Carotid: Velocities in the left ICA are consistent with a 1-39% stenosis. Vertebrals: Bilateral vertebral arteries demonstrate antegrade flow.   LABORATORY DATA: CBC Latest Ref Rng & Units 09/23/2020 07/11/2020 07/10/2020  WBC 3.4 - 10.8 x10E3/uL 9.1 8.2 7.2  Hemoglobin 11.1 - 15.9 g/dL 17.2(H) 16.0(H) 16.5(H)  Hematocrit 34.0 - 46.6 % 48.6(H) 46.4(H) 48.4(H)  Platelets 150 - 450 x10E3/uL 316 265 244    CMP Latest Ref Rng & Units 09/23/2020 07/11/2020 07/10/2020  Glucose 65 - 99 mg/dL 98 95 108(H)  BUN 8 - 27 mg/dL 13 16 17   Creatinine 0.57 - 1.00  mg/dL 0.60 0.61 0.57  Sodium 134 - 144 mmol/L 141 137 137  Potassium 3.5 - 5.2 mmol/L 3.9 3.5 4.0  Chloride 96 - 106 mmol/L 101 103 102  CO2 20 - 29 mmol/L 28 23 25   Calcium 8.7 - 10.3 mg/dL 9.6 9.0 8.6(L)  Total Protein 6.5 - 8.1 g/dL - 6.9 7.0  Total Bilirubin 0.3 - 1.2 mg/dL - 0.5 1.2  Alkaline Phos 38 - 126 U/L - 56 61  AST 15 - 41 U/L - 31 24  ALT 0 - 44 U/L - 22 20    Lipid Panel     Component Value Date/Time   CHOL 190 09/23/2020 1347   TRIG 218 (H) 09/23/2020 1347   TRIG 193 (H) 10/17/2014 1249   HDL 66 09/23/2020 1347   HDL 47 10/17/2014 1249   CHOLHDL 3.3 07/10/2020 1327   VLDL 33 07/10/2020 1327   LDLCALC 88 09/23/2020 1347   LABVLDL 36 09/23/2020 1347    No components found for: NTPROBNP Recent Labs    09/23/20 1347  PROBNP 47   Recent Labs    06/23/20 0232  TSH 1.610    BMP Recent Labs    07/10/20 1327 07/11/20 2128 09/23/20 1349  NA 137 137 141  K 4.0 3.5 3.9  CL 102 103 101  CO2 25 23 28   GLUCOSE 108* 95 98  BUN 17 16 13   CREATININE 0.57 0.61 0.60  CALCIUM 8.6* 9.0 9.6  GFRNONAA >60 >60 94  GFRAA >60 >60 108    HEMOGLOBIN A1C No results found for: HGBA1C, MPG  IMPRESSION:  No diagnosis found.   RECOMMENDATIONS: Jody Wilson is a 69 y.o. female whose past medical history and cardiac risk factors include:  hypertension, hyperlipidemia, GERD, active smoker, Thoracic aortic atherosclerosis, coronary artery calcification, advanced age, postmenopausal female.  Dyspnea on exertion: Since last office visit patient symptoms of effort related dyspnea continue to be persistent and affecting her ability to increase her physical activity on a daily basis.  In addition she has multiple cardiovascular risk factors and recurrent syncopal events.  She is noted to have coronary calcification in the LAD distribution, prolonged history of smoking, as well as aortic atherosclerosis.  We discussed evaluating for obstructive coronary artery disease as  her nuclear stress test was noted to be intermediate risk study due to ischemic ECG changes, decreased overall functional capacity, but myocardial perfusion reported to be normal.  We discussed undergoing either coronary CTA or left heart catheterization with possible intervention both of the studies were discussed with the patient.  Patient like to proceed with left heart catheterization with possible intervention given her continued symptoms of dyspnea on exertion and syncopal events. The procedure of left heart catheterization with possible intervention was explained to the patient in detail.  The indication, alternatives, risks and benefits were reviewed.  Complications include but not limited to bleeding, infection, vascular injury, stroke, myocardial infection, arrhythmia, kidney injury requiring hemodialysis, radiation-related injury in the case of prolonged fluoroscopy use, emergency cardiac surgery, and death. The patient understands the risks of serious complication is 1-2 in 4332 with diagnostic cardiac cath and 1-2% or less with angioplasty/stenting. The patient voices understanding and provides verbal feedback and wishes to proceed with coronary angiography with possible PCI. Check BMP, CBC, BNP and screen for COVID-19 infection prior to angiography.  Syncope: Patient was diagnosed with vasovagal syncope during her recent hospitalization in July 2021. 14-day extended Holter monitor results notes her underlying rhythm to be normal sinus without evidence of any significant dysrhythmias.  Patient is educated on the importance of decreasing alcohol consumption as this may lead to her intravascular depletion and vasovagal syncope.  Patient is currently also seeing neurology for the management of syncope as well.  Since the episodes of syncope have reoccurred I recommend that the patient refrain from driving or operating machinery for 6 months.  She verbalizes understanding.  Patient is asked to  consume 3 balanced meals and keeping herself well-hydrated.  She may also consider using compression stockings.  Coronary artery calcification: Continue statin therapy and aspirin.  Smoking: Educated on the importance of complete smoking cessation.  FINAL MEDICATION LIST END OF ENCOUNTER: Meds ordered this encounter  Medications  . sodium chloride flush (NS) 0.9 % injection 3 mL  . sodium chloride flush (NS) 0.9 % injection 3 mL  . 0.9 %  sodium chloride infusion  . aspirin chewable tablet 81 mg  . FOLLOWED BY Linked Order Group   . 0.9% sodium chloride infusion   . 0.9% sodium chloride infusion    There are no discontinued medications. d  Current Facility-Administered Medications:  .  0.9 %  sodium chloride infusion, 250 mL, Intravenous, PRN, Jagjit Riner, Reynold Bowen, MD .  0.9% sodium chloride infusion, 3 mL/kg/hr, Intravenous, Continuous, Last Rate: 198.6 mL/hr at 09/30/20 1130, 3 mL/kg/hr at 09/30/20 1130 **FOLLOWED BY** 0.9% sodium chloride infusion, 1 mL/kg/hr, Intravenous, Continuous, Tayveon Lombardo J, MD, Last Rate: 66.2 mL/hr at 09/30/20 1230, 1 mL/kg/hr at 09/30/20 1230 .  sodium chloride flush (NS) 0.9 % injection 3 mL, 3 mL, Intravenous, Q12H, Clancy Mullarkey J, MD .  sodium chloride flush (NS) 0.9 % injection 3 mL, 3 mL, Intravenous, PRN, Jahmai Finelli J, MD  Orders Placed This Encounter  Procedures  . Informed Consent Details: Physician/Practitioner Attestation; Transcribe to consent form and obtain patient signature  . Confirm CBC and BMP (or CMP) results within 7 days for inpatient and 30 days for outpatient: Outpatients with severe anemia (hgb<10, CKD, severe thrombocytopenia plts<100) labs should be within 10 days. Only draw PT/INR on patients that are on Coumadin, Hgb<10, have liver disease (cirrhosis, liver CA, hepatitis, etc). Urine pregnancy test within hospital admission for inpatients of child bearing age, for outpatients day of procedure.  . Confirm EKG  performed within 30 days for cardiac procedures and 12 months for peripheral vascular procedures.  Place order for EKG if missing or not within timeframe.  . Verify aspirin and / or anti-platelet medication (Plavix, Effient, Brilinta) dose available for cardiac / peripheral vascular procedure day. IF ordered daily / once, adjust schedule to administer before procedure.  . Weigh patient  . Initiate Cath/PCI clinical path; encourage patient to watch CCTV video  . Clip R groin  . Clip R radial (no IV/bracelet R wrist)  . EKG 12-Lead  . Insert peripheral IV  . Insert 2nd peripheral IV site-Saline lock IV    There are no outpatient Patient Instructions on file for this admission.   --Continue cardiac medications as reconciled in final medication list. We have gone to stop smoking and landing --No follow-ups on file. Or sooner if needed. --Continue follow-up with your primary care physician regarding the management of your other chronic comorbid conditions.  Patient's questions and concerns were addressed to her satisfaction. She voices understanding of the instructions provided during this encounter.   This note was created using a voice recognition software as a result there may be grammatical errors inadvertently enclosed that do not reflect the nature of this encounter. Every attempt is made to correct such errors.  Rex Kras, [place], [facility]  Pager: [phone] Office: [phone]

## 2020-10-01 ENCOUNTER — Encounter (HOSPITAL_COMMUNITY): Payer: Self-pay | Admitting: Cardiology

## 2020-10-05 DIAGNOSIS — R55 Syncope and collapse: Secondary | ICD-10-CM | POA: Diagnosis not present

## 2020-10-06 ENCOUNTER — Other Ambulatory Visit: Payer: Self-pay | Admitting: Family Medicine

## 2020-10-06 DIAGNOSIS — I10 Essential (primary) hypertension: Secondary | ICD-10-CM

## 2020-10-09 ENCOUNTER — Encounter: Payer: Self-pay | Admitting: Family Medicine

## 2020-10-09 ENCOUNTER — Ambulatory Visit (INDEPENDENT_AMBULATORY_CARE_PROVIDER_SITE_OTHER): Payer: Medicare Other | Admitting: Family Medicine

## 2020-10-09 ENCOUNTER — Other Ambulatory Visit: Payer: Self-pay

## 2020-10-09 VITALS — BP 142/86 | HR 72 | Temp 97.0°F | Ht 63.0 in | Wt 150.0 lb

## 2020-10-09 DIAGNOSIS — F419 Anxiety disorder, unspecified: Secondary | ICD-10-CM

## 2020-10-09 DIAGNOSIS — F32A Depression, unspecified: Secondary | ICD-10-CM | POA: Diagnosis not present

## 2020-10-09 DIAGNOSIS — Z23 Encounter for immunization: Secondary | ICD-10-CM | POA: Diagnosis not present

## 2020-10-09 DIAGNOSIS — E785 Hyperlipidemia, unspecified: Secondary | ICD-10-CM | POA: Diagnosis not present

## 2020-10-09 DIAGNOSIS — I6523 Occlusion and stenosis of bilateral carotid arteries: Secondary | ICD-10-CM | POA: Diagnosis not present

## 2020-10-09 DIAGNOSIS — I10 Essential (primary) hypertension: Secondary | ICD-10-CM | POA: Diagnosis not present

## 2020-10-09 MED ORDER — OLMESARTAN MEDOXOMIL 20 MG PO TABS: 20.0000 mg | ORAL_TABLET | Freq: Two times a day (BID) | ORAL | 3 refills | Status: DC

## 2020-10-09 MED ORDER — ALPRAZOLAM 0.5 MG PO TABS: 0.5000 mg | ORAL_TABLET | Freq: Two times a day (BID) | ORAL | 2 refills | Status: DC | PRN

## 2020-10-09 NOTE — Progress Notes (Signed)
Temp (!) 97 F (36.1 C)   Ht 5\' 3"  (1.6 m)   Wt 150 lb (68 kg)   BMI 26.57 kg/m 142/86  Subjective:   Patient ID: Jody Wilson, female    DOB: 1951-10-24, 69 y.o.   MRN: [id number]  HPI: Jody Wilson is a 69 y.o. female presenting on 10/09/2020 for Medical Management of Chronic Issues and Hypertension   HPI Hypertension Patient is currently on hydrochlorothiazide and Benicar, and their blood pressure today is 142/86, she says at home it will run up in the mornings and then down through the day, will be as high as the 180s but as low as the 90s over 60s.. Patient denies any lightheadedness or dizziness. Patient denies headaches, blurred vision, chest pains, shortness of breath, or weakness. Denies any side effects from medication and is content with current medication.   Anxiety recheck Current rx-Xanax 0.5 mg twice daily as needed # meds rx-60 Effectiveness of current meds-works well, uses them for tremors, sees neurology as well Adverse reactions form meds-none currently except for when she mixed it like she was exposed to, warned her of this she will never do it again.  She will always call before any future prescriptions  Pill count performed-No Last drug screen -07/15/2020 ( high risk q88m, moderate risk q4m, low risk yearly ) Urine drug screen today- No Was the [facility] reviewed-yes  If yes were their any concerning findings? -The only concern was when she did feel that lorazepam from the emergency department, it was a short prescription  No flowsheet data found.   Controlled substance contract signed on: 07/15/2020  Relevant past medical, surgical, family and social history reviewed and updated as indicated. Interim medical history since our last visit reviewed. Allergies and medications reviewed and updated.  Review of Systems  Constitutional: Negative for chills and fever.  Eyes: Negative for redness and visual disturbance.  Respiratory: Negative for chest  tightness and shortness of breath.   Cardiovascular: Negative for chest pain and leg swelling.  Genitourinary: Negative for difficulty urinating and dysuria.  Musculoskeletal: Negative for back pain and gait problem.  Skin: Negative for rash.  Neurological: Negative for dizziness, light-headedness and headaches.  Psychiatric/Behavioral: Negative for agitation and behavioral problems.  All other systems reviewed and are negative.   Per HPI unless specifically indicated above   Allergies as of 10/09/2020      Reactions   Tetracyclines & Related Itching, Swelling      Medication List       Accurate as of October 09, 2020  9:33 AM. If you have any questions, ask your nurse or doctor.        ALPRAZolam 0.5 MG tablet Commonly known as: XANAX Take 0.5 mg by mouth 2 (two) times daily as needed for anxiety.   aspirin EC 81 MG tablet Take 1 tablet (81 mg total) by mouth daily. Swallow whole.   calcium-vitamin D 500-200 MG-UNIT tablet Commonly known as: OSCAL WITH D Take 1 tablet by mouth daily with lunch.   citalopram 20 MG tablet Commonly known as: CELEXA Take 1 tablet (20 mg total) by mouth daily.   cyanocobalamin 1000 MCG/ML injection Commonly known as: (VITAMIN B-12) Inject 1,000 mcg into the muscle every 28 (twenty-eight) days.   denosumab 60 MG/ML Sosy injection Commonly known as: PROLIA Inject 60 mg into the skin every 6 (six) months.   folic acid 1 MG tablet Commonly known as: FOLVITE Take 1 mg by mouth daily.  hydrochlorothiazide 12.5 MG capsule Commonly known as: MICROZIDE Take 1 capsule (12.5 mg total) by mouth daily.   levETIRAcetam 250 MG tablet Commonly known as: Keppra Take 1 tablet (250 mg total) by mouth at bedtime.   olmesartan 40 MG tablet Commonly known as: BENICAR TAKE 1 TABLET DAILY   pantoprazole 40 MG tablet Commonly known as: PROTONIX Take 1 tablet (40 mg total) by mouth daily.   pravastatin 20 MG tablet Commonly known as:  PRAVACHOL Take 1 tablet (20 mg total) by mouth daily. What changed: when to take this   valACYclovir 1000 MG tablet Commonly known as: VALTREX Take 2 tablets (2,000 mg total) by mouth 2 (two) times daily. For 2 days What changed:   when to take this  reasons to take this  additional instructions   WOMENS 50+ MULTI VITAMIN/MIN PO Take 1 tablet by mouth every Monday, Tuesday, Wednesday, Thursday, and Friday.        Objective:   Temp (!) 97 F (36.1 C)   Ht 5\' 3"  (1.6 m)   Wt 150 lb (68 kg)   BMI 26.57 kg/m   Wt Readings from Last 3 Encounters:  10/09/20 150 lb (68 kg)  09/30/20 144 lb (65.3 kg)  09/17/20 146 lb (66.2 kg)    Physical Exam Vitals and nursing note reviewed.  Constitutional:      General: She is not in acute distress.    Appearance: She is well-developed. She is not diaphoretic.  Eyes:     Conjunctiva/sclera: Conjunctivae normal.  Cardiovascular:     Rate and Rhythm: Normal rate and regular rhythm.     Heart sounds: Normal heart sounds. No murmur heard.   Pulmonary:     Effort: Pulmonary effort is normal. No respiratory distress.     Breath sounds: Normal breath sounds. No wheezing.  Musculoskeletal:        General: No tenderness. Normal range of motion.  Skin:    General: Skin is warm and dry.     Findings: No rash.  Neurological:     Mental Status: She is alert and oriented to person, place, and time.     Coordination: Coordination normal.  Psychiatric:        Behavior: Behavior normal.       Assessment & Plan:   Problem List Items Addressed This Visit      Cardiovascular and Mediastinum   Hypertension   Relevant Medications   olmesartan (BENICAR) 20 MG tablet     Other   Hyperlipidemia with target LDL less than 100   Relevant Medications   olmesartan (BENICAR) 20 MG tablet   Anxiety and depression   Relevant Medications   ALPRAZolam (XANAX) 0.5 MG tablet    Other Visit Diagnoses    Flu vaccine need    -  Primary    Relevant Orders   Flu Vaccine QUAD High Dose(Fluad)   Essential hypertension       Relevant Medications   olmesartan (BENICAR) 20 MG tablet    Patient had come back positive for lorazepam, hematuria once from the emergency department, instructed her that we will give her a second chance but she could never fill any prescriptions like this from an outside source.  She always needs to contact us before.  Follow up plan: Return in about 3 months (around 01/09/2021), or if symptoms worsen or fail to improve, for Anxiety.  Counseling provided for all of the vaccine components Orders Placed This Encounter  Procedures  . Flu Vaccine  QUAD High Dose(Fluad)    [name], MD [facility] 10/09/2020, 9:33 AM

## 2020-10-16 ENCOUNTER — Ambulatory Visit: Payer: Medicare Other | Admitting: Cardiology

## 2020-10-21 DIAGNOSIS — Z23 Encounter for immunization: Secondary | ICD-10-CM | POA: Diagnosis not present

## 2020-10-28 ENCOUNTER — Ambulatory Visit: Payer: Medicare Other | Admitting: Cardiology

## 2020-10-28 ENCOUNTER — Other Ambulatory Visit: Payer: Self-pay

## 2020-10-28 ENCOUNTER — Encounter: Payer: Self-pay | Admitting: Cardiology

## 2020-10-28 VITALS — BP 156/72 | HR 74 | Resp 16 | Ht 63.0 in | Wt 151.0 lb

## 2020-10-28 DIAGNOSIS — E782 Mixed hyperlipidemia: Secondary | ICD-10-CM

## 2020-10-28 DIAGNOSIS — I6523 Occlusion and stenosis of bilateral carotid arteries: Secondary | ICD-10-CM | POA: Diagnosis not present

## 2020-10-28 DIAGNOSIS — I2510 Atherosclerotic heart disease of native coronary artery without angina pectoris: Secondary | ICD-10-CM | POA: Diagnosis not present

## 2020-10-28 DIAGNOSIS — I2584 Coronary atherosclerosis due to calcified coronary lesion: Secondary | ICD-10-CM

## 2020-10-28 DIAGNOSIS — I700 Atherosclerosis of aorta: Secondary | ICD-10-CM

## 2020-10-28 DIAGNOSIS — F17200 Nicotine dependence, unspecified, uncomplicated: Secondary | ICD-10-CM

## 2020-10-28 DIAGNOSIS — R0609 Other forms of dyspnea: Secondary | ICD-10-CM

## 2020-10-28 DIAGNOSIS — F17210 Nicotine dependence, cigarettes, uncomplicated: Secondary | ICD-10-CM | POA: Diagnosis not present

## 2020-10-28 DIAGNOSIS — R0600 Dyspnea, unspecified: Secondary | ICD-10-CM

## 2020-10-28 DIAGNOSIS — I10 Essential (primary) hypertension: Secondary | ICD-10-CM | POA: Diagnosis not present

## 2020-10-28 NOTE — Progress Notes (Signed)
Date:  10/29/2020   ID:  [name], DOB Oct 20, 1951, MRN [id number]  PCP:  Dettinger, Fransisca Kaufmann, MD  Cardiologist:  Rex Kras, DO, [facility] (established care 07/29/2020)  Date: 10/28/2020 Last Office Visit: 09/17/2020  Chief Complaint  Patient presents with  . Shortness of Breath  . Syncope and collapse  . Follow-up    4 week    HPI  Jody Wilson is a 69 y.o. female who presents to the office with a chief complaint of " reevaluation of symptoms and post heart catheterization follow-up." Her past medical history and cardiovascular risk factors include: hypertension, hyperlipidemia, GERD, active smoker, Thoracic aortic atherosclerosis, coronary artery calcification, advanced age, postmenopausal female.  Patient is referred to the office at the request of her primary care provider for evaluation of syncope.  Patient was diagnosed with vasovagal syncope and underwent a extensive cardiovascular evaluation. However, due to multiple syncopal events, effort related dyspnea, and decreased functional capacity that concern for obstructive CAD due to balanced ischemia needed to be investigated further. This shared decision was to proceed with left heart catheterization to rule out obstructive CAD due to recurrent syncopal events.  Since last visit patient has done well from a cardiovascular standpoint. Patient states that she has not had any syncopal events since last encounter. She started taking B12 injections which have improved her overall energy level. Her antihypertensive medications have also been titrated by her PCP and she is pleased with the improvement in her blood pressures. She states that the dyspnea on exertion is still present but significantly improved compared to prior. She is also decreasing the amount of smoking. She smokes now 4 to 5 cigarettes/day with intentions to quit prior to the end of the year. She has not had any alcohol since last office visit.  Patient is informed  that her left heart catheterization was uneventful and noted to have normal epicardial coronary artery disease.  No family history of premature coronary disease or sudden cardiac death.  FUNCTIONAL STATUS: Walks 42min everyday 4 days a week.    ALLERGIES: Allergies  Allergen Reactions  . Tetracyclines & Related Itching and Swelling    MEDICATION LIST PRIOR TO VISIT: Current Meds  Medication Sig  . ALPRAZolam (XANAX) 0.5 MG tablet Take 1 tablet (0.5 mg total) by mouth 2 (two) times daily as needed for anxiety.  Marland Kitchen aspirin EC 81 MG tablet Take 1 tablet (81 mg total) by mouth daily. Swallow whole.  . calcium-vitamin D (OSCAL WITH D) 500-200 MG-UNIT tablet Take 1 tablet by mouth daily with lunch.   . citalopram (CELEXA) 20 MG tablet Take 1 tablet (20 mg total) by mouth daily.  . cyanocobalamin (,VITAMIN B-12,) 1000 MCG/ML injection Inject 1,000 mcg into the muscle every 28 (twenty-eight) days.   Marland Kitchen denosumab (PROLIA) 60 MG/ML SOSY injection Inject 60 mg into the skin every 6 (six) months.  . folic acid (FOLVITE) 1 MG tablet Take 1 mg by mouth daily.  . hydrochlorothiazide (MICROZIDE) 12.5 MG capsule Take 1 capsule (12.5 mg total) by mouth daily.  Marland Kitchen levETIRAcetam (KEPPRA) 250 MG tablet Take 1 tablet (250 mg total) by mouth at bedtime.  . Multiple Vitamins-Minerals (WOMENS 50+ MULTI VITAMIN/MIN PO) Take 1 tablet by mouth every Monday, Tuesday, Wednesday, Thursday, and Friday.   . olmesartan (BENICAR) 20 MG tablet Take 1 tablet (20 mg total) by mouth in the morning and at bedtime.  . pantoprazole (PROTONIX) 40 MG tablet Take 1 tablet (40 mg total) by mouth daily.  Marland Kitchen  pravastatin (PRAVACHOL) 20 MG tablet Take 1 tablet (20 mg total) by mouth daily. (Patient taking differently: Take 20 mg by mouth at bedtime. )  . valACYclovir (VALTREX) 1000 MG tablet Take 2 tablets (2,000 mg total) by mouth 2 (two) times daily. For 2 days (Patient taking differently: Take 2,000 mg by mouth 2 (two) times daily as  needed (Fever blisters). )   Current Facility-Administered Medications for the 10/28/20 encounter (Office Visit) with Rex Kras, DO  Medication  . denosumab (PROLIA) injection 60 mg     PAST MEDICAL HISTORY: Past Medical History:  Diagnosis Date  . Anemia   . Anxiety   . Aortic atherosclerosis ([place])   . Depression   . GERD (gastroesophageal reflux disease)   . Hyperlipidemia   . Hypertension   . Orthostatic hypotension   . Osteoporosis     PAST SURGICAL HISTORY: Past Surgical History:  Procedure Laterality Date  . ABDOMINAL HYSTERECTOMY    . BACK SURGERY    . CHOLECYSTECTOMY    . FOOT SURGERY     7 in the past  . IR GENERIC HISTORICAL  12/09/2016   IR RADIOLOGIST EVAL & MGMT 12/09/2016 [facility]  . IR GENERIC HISTORICAL  12/10/2016   IR VERTEBROPLASTY CERV/THOR BX INC UNI/BIL INC/INJECT/IMAGING 12/10/2016 [facility]  . LEFT HEART CATH AND CORONARY ANGIOGRAPHY N/A 09/30/2020   Procedure: LEFT HEART CATH AND CORONARY ANGIOGRAPHY;  Surgeon: Nigel Mormon, MD;  Location: [facility];  Service: Cardiovascular;  Laterality: N/A;  . TRACHEOSTOMY TUBE PLACEMENT  1982    FAMILY HISTORY: The patient family history includes Aneurysm in her paternal uncle; Atrial fibrillation in her half-sister; Congestive Heart Failure in her mother; Diabetes in her mother; Heart attack in her maternal grandfather and maternal grandmother; Heart disease in her mother; Hyperlipidemia in her mother; Hypertension in her mother and son; Liver disease (age of onset: 15) in her son; Lupus in her half-sister; Other (age of onset: 25) in her son.  SOCIAL HISTORY:  The patient  reports that she has been smoking cigarettes. She has been smoking about 0.25 packs per day. She has never used smokeless tobacco. She reports previous alcohol use. She reports that she does not use drugs.  REVIEW OF SYSTEMS: Review of Systems  Constitutional: Negative for chills and fever.  HENT: Negative for  hoarse voice and nosebleeds.   Eyes: Negative for discharge, double vision and pain.  Cardiovascular: Positive for dyspnea on exertion. Negative for chest pain, claudication, leg swelling, near-syncope, orthopnea, palpitations, paroxysmal nocturnal dyspnea and syncope.  Respiratory: Negative for hemoptysis and shortness of breath.   Musculoskeletal: Negative for muscle cramps and myalgias.  Gastrointestinal: Negative for abdominal pain, constipation, diarrhea, hematemesis, hematochezia, melena, nausea and vomiting.  Neurological: Negative for dizziness and light-headedness.    PHYSICAL EXAM: Vitals with BMI 10/28/2020 10/09/2020 09/30/2020  Height 5\' 3"  5\' 3"  -  Weight 151 lbs 150 lbs -  BMI 01.75 10.25 -  Systolic 852 778 -  Diastolic 72 86 -  Pulse 74 72 70   CONSTITUTIONAL: Well-developed and well-nourished. No acute distress.  SKIN: Skin is warm and dry. No rash noted. No cyanosis. No pallor. No jaundice HEAD: Normocephalic and atraumatic.  EYES: No scleral icterus MOUTH/THROAT: Moist oral membranes.  NECK: No JVD present. No thyromegaly noted. No carotid bruits  LYMPHATIC: No visible cervical adenopathy.  CHEST Normal respiratory effort. No intercostal retractions  LUNGS: Clear to auscultation bilaterally. No stridor. No wheezes. No rales.  CARDIOVASCULAR: Regular  rate and rhythm, positive S1-S2, no murmurs rubs or gallops appreciated. ABDOMINAL: No apparent ascites.  EXTREMITIES: No peripheral edema  HEMATOLOGIC: No significant bruising NEUROLOGIC: Oriented to person, place, and time. Nonfocal. Normal muscle tone.  PSYCHIATRIC: Normal mood and affect. Normal behavior. Cooperative  RADIOLOGY:  MR Abdomen w/ and w/o contrast 06/03/2020:  1. Area of concern on prior chest CT represents geographic fat intensification on a background of global mild hepatic steatosis. No suspicious lesion in the liver with lobular contours. Correlate with any clinical or laboratory evidence of liver  disease. 2. Small focal outpouching approximately 7 x 7 mm along the distal thoracic aorta may represent a penetrating atherosclerotic ulcer not associated with acute features and unchanged since previous chest CT. Consider CT angiogram follow-up in 1 year with vascular surgery consultation to guide further follow-up. 3. Large hiatal hernia extending into the chest as on the recent CT of the chest. 4. Compression fracture at T11 better demonstrated on recent chest CT. 5. Aortic atherosclerosis.  CT chest lung cancer screening: 04/25/2020: Mild coronary atherosclerosis of the LAD.  CARDIAC DATABASE: EKG: 07/29/2020: Normal sinus rhythm, 79 bpm, normal axis, left atrial enlargement, without underlying ischemia or injury pattern.  Echocardiogram: 06/23/2020: LVEF 60-65%, indeterminate diastolic parameters, no pulmonary hypertension, RVSP 20 mmHg mild MR, aortic valve sclerosis without stenosis.  Stress Testing: Exercise Myoview stress test 08/13/2020: Exercise nuclear stress test was performed using Bruce protocol. Patient reached 5.0 METS, and 92% of age predicted maximum heart rate. Exercise capacity was low. Chest pain not reported. Exertional dyspnea and dizziness reported. Heart rate and hemodynamic response were normal Stress EKG demonstrated sinus tachycardia, 1.5-2 mm horizontal/upsloping ST depression in leads II, III, aVF, V5, V6, normalizing 2 min into recovery. EKG changes are positive for ischemia.  Normal myocardial perfusion. Stress LVEF 73%. Intermediate risk study due to EKG changes, low exercise capacity.   Heart Catheterization: 09/30/2020: Right dominant circulation Normal coronary arteries Normal LVEDP  Carotid duplex: 06/23/2020:  Right Carotid: Velocities in the right ICA are consistent with a 1-39% stenosis. Left Carotid: Velocities in the left ICA are consistent with a 1-39% stenosis. Vertebrals: Bilateral vertebral arteries demonstrate antegrade flow.   14 day  extended Holter monitor: Dominant rhythm normal sinus. Heart rate 52 -150bpm.  Avg HR 73 bpm. No atrial fibrillation, ventricular tachycardia, high grade AV block, pauses (3 seconds or longer). Total ventricular ectopic burden <1%. Total supraventricular ectopic burden <1%. Patient triggered events: 9.  Underlying rhythm normal sinus with the exception of one event noted NSR with transition to SVT underlying rhythm difficult to determine due to baseline artifact.   LABORATORY DATA: CBC Latest Ref Rng & Units 09/23/2020 07/11/2020 07/10/2020  WBC 3.4 - 10.8 x10E3/uL 9.1 8.2 7.2  Hemoglobin 11.1 - 15.9 g/dL 17.2(H) 16.0(H) 16.5(H)  Hematocrit 34.0 - 46.6 % 48.6(H) 46.4(H) 48.4(H)  Platelets 150 - 450 x10E3/uL 316 265 244    CMP Latest Ref Rng & Units 09/23/2020 07/11/2020 07/10/2020  Glucose 65 - 99 mg/dL 98 95 108(H)  BUN 8 - 27 mg/dL 13 16 17   Creatinine 0.57 - 1.00 mg/dL 0.60 0.61 0.57  Sodium 134 - 144 mmol/L 141 137 137  Potassium 3.5 - 5.2 mmol/L 3.9 3.5 4.0  Chloride 96 - 106 mmol/L 101 103 102  CO2 20 - 29 mmol/L 28 23 25   Calcium 8.7 - 10.3 mg/dL 9.6 9.0 8.6(L)  Total Protein 6.5 - 8.1 g/dL - 6.9 7.0  Total Bilirubin 0.3 - 1.2 mg/dL - 0.5 1.2  Alkaline Phos 38 - 126 U/L - 56 61  AST 15 - 41 U/L - 31 24  ALT 0 - 44 U/L - 22 20    Lab Results  Component Value Date   CHOL 190 09/23/2020   HDL 66 09/23/2020   LDLCALC 88 09/23/2020   TRIG 218 (H) 09/23/2020   CHOLHDL 3.3 07/10/2020    No components found for: NTPROBNP Recent Labs    09/23/20 1347  PROBNP 47   Recent Labs    06/23/20 0232  TSH 1.610    BMP Recent Labs    07/10/20 1327 07/11/20 2128 09/23/20 1349  NA 137 137 141  K 4.0 3.5 3.9  CL 102 103 101  CO2 25 23 28   GLUCOSE 108* 95 98  BUN 17 16 13   CREATININE 0.57 0.61 0.60  CALCIUM 8.6* 9.0 9.6  GFRNONAA >60 >60 94  GFRAA >60 >60 108    HEMOGLOBIN A1C No results found for: HGBA1C, MPG  IMPRESSION:    ICD-10-CM   1. Coronary  atherosclerosis due to calcified coronary lesion of native artery  I25.10    I25.84   2. Dyspnea on exertion  R06.00   3. Essential hypertension  I10   4. Mixed hyperlipidemia  E78.2   5. Smoking  F17.200   6. Aortic atherosclerosis ([place]), CT Chest 04/25/2020  I70.0   7. Atherosclerosis of both carotid arteries  I65.23 PCV CAROTID DUPLEX (BILATERAL)     RECOMMENDATIONS: Jody Wilson is a 70 y.o. female whose past medical history and cardiac risk factors include:  hypertension, hyperlipidemia, GERD, active smoker, Thoracic aortic atherosclerosis, coronary artery calcification, advanced age, postmenopausal female.  Dyspnea on exertion: Improving.  Due to coronary artery calcification in the LAD distribution, prolonged history of smoking, carotid artery atherosclerosis, aortic atherosclerosis, she underwent an ischemic evaluation to rule out obstructive CAD. The nuclear stress test noted normal myocardial perfusion; however, ECG portion was concerning for ischemia. In addition, she was having decreased physical endurance, recurrent syncope,the concern for balanced ischemia rose and therefore, recommended to undergo invasive angiography to rule out obstructive CAD.  Left heart catheterization reported normal epicardial coronary arteries. She is working on improving her modifiable cardiovascular risk factors and since last office visit her dyspnea on exertion has improved.  Syncope:  Patient was diagnosed with vasovagal syncope during her recent hospitalization in July 2021.   No recurrence of syncope since the cessation of alcohol consumption in the last office visit.  Continue to monitor.   Coronary artery calcification: Continue statin therapy and aspirin.  Benign essential hypertension: Currently managed by PCP. Medications reconciled. Blood pressures are better controlled compared to office readings.  Smoking:  Tobacco cessation counseling:  Currently smoking 4-5cig/day     Patient was informed of the dangers of tobacco abuse including stroke, cancer, and MI, as well as benefits of tobacco cessation.  Patient is willing to quit at this time.  Approximately 7 mins were spent counseling patient cessation techniques. We discussed various methods to help quit smoking, including deciding on a date to quit, joining a support group, pharmacological agents- nicotine gum/patch/lozenges, chantix.   I will reassess her progress at the next follow-up visit  FINAL MEDICATION LIST END OF ENCOUNTER: No orders of the defined types were placed in this encounter.   There are no discontinued medications.   Current Outpatient Medications:  .  ALPRAZolam (XANAX) 0.5 MG tablet, Take 1 tablet (0.5 mg total) by mouth 2 (two) times daily as needed for  anxiety., Disp: 60 tablet, Rfl: 2 .  aspirin EC 81 MG tablet, Take 1 tablet (81 mg total) by mouth daily. Swallow whole., Disp: 90 tablet, Rfl: 3 .  calcium-vitamin D (OSCAL WITH D) 500-200 MG-UNIT tablet, Take 1 tablet by mouth daily with lunch. , Disp: , Rfl:  .  citalopram (CELEXA) 20 MG tablet, Take 1 tablet (20 mg total) by mouth daily., Disp: 90 tablet, Rfl: 3 .  cyanocobalamin (,VITAMIN B-12,) 1000 MCG/ML injection, Inject 1,000 mcg into the muscle every 28 (twenty-eight) days. , Disp: , Rfl:  .  denosumab (PROLIA) 60 MG/ML SOSY injection, Inject 60 mg into the skin every 6 (six) months., Disp: 2 mL, Rfl: 0 .  folic acid (FOLVITE) 1 MG tablet, Take 1 mg by mouth daily., Disp: , Rfl:  .  hydrochlorothiazide (MICROZIDE) 12.5 MG capsule, Take 1 capsule (12.5 mg total) by mouth daily., Disp: 90 capsule, Rfl: 3 .  levETIRAcetam (KEPPRA) 250 MG tablet, Take 1 tablet (250 mg total) by mouth at bedtime., Disp: 30 tablet, Rfl: 3 .  Multiple Vitamins-Minerals (WOMENS 50+ MULTI VITAMIN/MIN PO), Take 1 tablet by mouth every Monday, Tuesday, Wednesday, Thursday, and Friday. , Disp: , Rfl:  .  olmesartan (BENICAR) 20 MG tablet, Take 1 tablet  (20 mg total) by mouth in the morning and at bedtime., Disp: 180 tablet, Rfl: 3 .  pantoprazole (PROTONIX) 40 MG tablet, Take 1 tablet (40 mg total) by mouth daily., Disp: 90 tablet, Rfl: 3 .  pravastatin (PRAVACHOL) 20 MG tablet, Take 1 tablet (20 mg total) by mouth daily. (Patient taking differently: Take 20 mg by mouth at bedtime. ), Disp: 90 tablet, Rfl: 1 .  valACYclovir (VALTREX) 1000 MG tablet, Take 2 tablets (2,000 mg total) by mouth 2 (two) times daily. For 2 days (Patient taking differently: Take 2,000 mg by mouth 2 (two) times daily as needed (Fever blisters). ), Disp: 8 tablet, Rfl: 10  Current Facility-Administered Medications:  .  denosumab (PROLIA) injection 60 mg, 60 mg, Subcutaneous, Q6 months, Dettinger, Fransisca Kaufmann, MD, 60 mg at 11/14/19 1553  Orders Placed This Encounter  Procedures  . PCV CAROTID DUPLEX (BILATERAL)   There are no Patient Instructions on file for this visit.   --Continue cardiac medications as reconciled in final medication list. We have gone to stop smoking and landing --Return for Follow up, Coronary artery calcification. Or sooner if needed. --Continue follow-up with your primary care physician regarding the management of your other chronic comorbid conditions.  Patient's questions and concerns were addressed to her satisfaction. She voices understanding of the instructions provided during this encounter.   This note was created using a voice recognition software as a result there may be grammatical errors inadvertently enclosed that do not reflect the nature of this encounter. Every attempt is made to correct such errors.  Rex Kras, [place], [facility]  Pager: [phone] Office: [phone]

## 2020-10-29 ENCOUNTER — Encounter: Payer: Self-pay | Admitting: Cardiology

## 2020-11-25 ENCOUNTER — Other Ambulatory Visit (HOSPITAL_COMMUNITY): Payer: Medicare Other

## 2020-12-02 ENCOUNTER — Ambulatory Visit (HOSPITAL_COMMUNITY): Payer: Medicare Other | Admitting: Oncology

## 2020-12-08 ENCOUNTER — Ambulatory Visit (INDEPENDENT_AMBULATORY_CARE_PROVIDER_SITE_OTHER): Payer: Medicare Other | Admitting: *Deleted

## 2020-12-08 DIAGNOSIS — Z0000 Encounter for general adult medical examination without abnormal findings: Secondary | ICD-10-CM

## 2020-12-08 NOTE — Progress Notes (Signed)
MEDICARE ANNUAL WELLNESS VISIT  12/08/2020  Telephone Visit Disclaimer This Medicare AWV was conducted by telephone due to national recommendations for restrictions regarding the COVID-19 Pandemic (e.g. social distancing).  I verified, using two identifiers, that I am speaking with Jody Wilson or their authorized healthcare agent. I discussed the limitations, risks, security, and privacy concerns of performing an evaluation and management service by telephone and the potential availability of an in-person appointment in the future. The patient expressed understanding and agreed to proceed.  Location of Patient: home Location of Provider (nurse): office  Subjective:    Jody Wilson is a 69 y.o. female patient of Dettinger, Fransisca Kaufmann, MD who had a Medicare Annual Wellness Visit today via telephone. Myrtice is Retired and lives with their spouse. she has 2 children. she reports that she is socially active and does interact with friends/family regularly. she is minimally physically active and enjoys sewing.  Patient Care Team: Dettinger, Fransisca Kaufmann, MD as PCP - General (Family Medicine) Steffanie Rainwater, DPM as Consulting Physician (Podiatry) Arvella Nigh, MD as Consulting Physician (Obstetrics and Gynecology) Mothershed, Mamie Laurel., DPM (Podiatry) Derek Jack, MD as Consulting Physician (Hematology)  Advanced Directives 12/08/2020 09/30/2020 07/28/2020 06/22/2020 04/09/2020 03/17/2020 11/06/2019  Does Patient Have a Medical Advance Directive? No No No No No No No  Would patient like information on creating a medical advance directive? Yes (MAU/Ambulatory/Procedural Areas - Information given) Yes (MAU/Ambulatory/Procedural Areas - Information given) No - Patient declined No - Patient declined No - Patient declined No - Patient declined Yes (MAU/Ambulatory/Procedural Areas - Information given)    Hospital Utilization Over the Past 12 Months: # of hospitalizations or ER visits: 3 # of  surgeries: 0  Review of Systems    Patient reports that her overall health is better compared to last year.  History obtained from chart review and the patient  Patient Reported Readings (BP, Pulse, CBG, Weight, etc) none  Pain Assessment Pain : No/denies pain     Current Medications & Allergies (verified) Allergies as of 12/08/2020      Reactions   Tetracyclines & Related Itching, Swelling      Medication List       Accurate as of December 08, 2020  2:08 PM. If you have any questions, ask your nurse or doctor.        ALPRAZolam 0.5 MG tablet Commonly known as: XANAX Take 1 tablet (0.5 mg total) by mouth 2 (two) times daily as needed for anxiety.   aspirin EC 81 MG tablet Take 1 tablet (81 mg total) by mouth daily. Swallow whole.   calcium-vitamin D 500-200 MG-UNIT tablet Commonly known as: OSCAL WITH D Take 1 tablet by mouth daily with lunch.   citalopram 20 MG tablet Commonly known as: CELEXA Take 1 tablet (20 mg total) by mouth daily.   cyanocobalamin 1000 MCG/ML injection Commonly known as: (VITAMIN B-12) Inject 1,000 mcg into the muscle every 28 (twenty-eight) days.   denosumab 60 MG/ML Sosy injection Commonly known as: PROLIA Inject 60 mg into the skin every 6 (six) months.   folic acid 1 MG tablet Commonly known as: FOLVITE Take 1 mg by mouth daily.   hydrochlorothiazide 12.5 MG capsule Commonly known as: MICROZIDE Take 1 capsule (12.5 mg total) by mouth daily.   levETIRAcetam 250 MG tablet Commonly known as: Keppra Take 1 tablet (250 mg total) by mouth at bedtime.   olmesartan 20 MG tablet Commonly known as: BENICAR Take 1 tablet (20 mg  total) by mouth in the morning and at bedtime.   pantoprazole 40 MG tablet Commonly known as: PROTONIX Take 1 tablet (40 mg total) by mouth daily.   pravastatin 20 MG tablet Commonly known as: PRAVACHOL Take 1 tablet (20 mg total) by mouth daily. What changed: when to take this   valACYclovir 1000 MG  tablet Commonly known as: VALTREX Take 2 tablets (2,000 mg total) by mouth 2 (two) times daily. For 2 days What changed:   when to take this  reasons to take this  additional instructions   WOMENS 50+ MULTI VITAMIN/MIN PO Take 1 tablet by mouth every Monday, Tuesday, Wednesday, Thursday, and Friday.       History (reviewed): Past Medical History:  Diagnosis Date  . Anemia   . Anxiety   . Aortic atherosclerosis ([place])   . Depression   . GERD (gastroesophageal reflux disease)   . Hyperlipidemia   . Hypertension   . Orthostatic hypotension   . Osteoporosis    Past Surgical History:  Procedure Laterality Date  . ABDOMINAL HYSTERECTOMY    . BACK SURGERY    . CHOLECYSTECTOMY    . FOOT SURGERY     7 in the past  . IR GENERIC HISTORICAL  12/09/2016   IR RADIOLOGIST EVAL & MGMT 12/09/2016 [facility]  . IR GENERIC HISTORICAL  12/10/2016   IR VERTEBROPLASTY CERV/THOR BX INC UNI/BIL INC/INJECT/IMAGING 12/10/2016 [facility]  . LEFT HEART CATH AND CORONARY ANGIOGRAPHY N/A 09/30/2020   Procedure: LEFT HEART CATH AND CORONARY ANGIOGRAPHY;  Surgeon: Nigel Mormon, MD;  Location: [facility];  Service: Cardiovascular;  Laterality: N/A;  . TRACHEOSTOMY TUBE PLACEMENT  1982   Family History  Problem Relation Age of Onset  . Heart disease Mother   . Hypertension Mother   . Hyperlipidemia Mother   . Diabetes Mother   . Congestive Heart Failure Mother   . Hypertension Son   . Other Son 36       [place]'s Disease  . Liver disease Son 46       Gilbert's Disease  . Aneurysm Paternal Uncle   . Heart attack Maternal Grandmother   . Heart attack Maternal Grandfather   . Lupus Half-Sister   . Atrial fibrillation Half-Sister    Social History   Socioeconomic History  . Marital status: Married    Spouse name: Donnie  . Number of children: 2  . Years of education: 48  . Highest education level: Associate degree: occupational, Hotel manager, or vocational program   Occupational History  . 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  Tobacco Use  . Smoking status: Current Every Day Smoker    Packs/day: 0.25    Types: Cigarettes  . Smokeless tobacco: Never Used  . Tobacco comment: Trying to cut on number of cigarettes she smokes.  Vaping Use  . Vaping Use: Never used  Substance and Sexual Activity  . Alcohol use: Not Currently    Comment: occassionally   . Drug use: No  . Sexual activity: Not on file  Other Topics Concern  . Not on file  Social History Narrative   Lives with her husband, [name], and has two grown sons and two grand sons.   She visits family and has a once weekly outing with a friend for socializing.  She enjoys making beaded jewelry, reading and walking..   Social Determinants of Health   Financial Resource Strain: Not  on file  Food Insecurity: Not on file  Transportation Needs: No Transportation Needs  . Lack of Transportation (Medical): No  . Lack of Transportation (Non-Medical): No  Physical Activity: Insufficiently Active  . Days of Exercise per Week: 2 days  . Minutes of Exercise per Session: 20 min  Stress: Not on file  Social Connections: Not on file    Activities of Daily Living In your present state of health, do you have any difficulty performing the following activities: 12/08/2020 06/23/2020  Hearing? N N  Vision? Y N  Comment glasses -  Difficulty concentrating or making decisions? N N  Walking or climbing stairs? N N  Dressing or bathing? N Y  Doing errands, shopping? N N  Preparing Food and eating ? N -  Using the Toilet? N -  In the past six months, have you accidently leaked urine? N -  Do you have problems with loss of bowel control? N -  Managing your Medications? N -  Managing your Finances? N -  Housekeeping or managing your Housekeeping? N -  Some recent data might be hidden    Patient Education/ Literacy How often do you  need to have someone help you when you read instructions, pamphlets, or other written materials from your doctor or pharmacy?: 1 - Never What is the last grade level you completed in school?: GED  Exercise Current Exercise Habits: Home exercise routine, Type of exercise: walking, Time (Minutes): 15, Frequency (Times/Week): 2, Weekly Exercise (Minutes/Week): 30, Intensity: Mild  Diet Patient reports consuming 2 meals a day and 2 snack(s) a day Patient reports that her primary diet is: Low Sodium Patient reports that she does have regular access to food.   Depression Screen PHQ 2/9 Scores 12/08/2020 10/09/2020 07/09/2020 06/26/2020 04/04/2020 12/04/2019 11/09/2019  PHQ - 2 Score 0 0 0 0 0 0 0  PHQ- 9 Score - - - - - - -  Exception Documentation - - - - - - -     Fall Risk Fall Risk  12/08/2020 10/09/2020 10/09/2020 07/09/2020 06/26/2020  Falls in the past year? 1 1 0 1 0  Number falls in past yr: 1 0 - 1 -  Injury with Fall? 0 0 - 0 -  Comment - - - - -  Risk for fall due to : History of fall(s) Mental status change - Impaired balance/gait -  Follow up Falls prevention discussed Falls evaluation completed - Falls evaluation completed -     Objective:  Jody Wilson seemed alert and oriented and she participated appropriately during our telephone visit.  Blood Pressure Weight BMI  BP Readings from Last 3 Encounters:  10/28/20 (!) 156/72  10/09/20 (!) 142/86  09/30/20 129/70   Wt Readings from Last 3 Encounters:  10/28/20 151 lb (68.5 kg)  10/09/20 150 lb (68 kg)  09/30/20 144 lb (65.3 kg)   BMI Readings from Last 1 Encounters:  10/28/20 26.75 kg/m    *Unable to obtain current vital signs, weight, and BMI due to telephone visit type  Hearing/Vision  . Lamira did not seem to have difficulty with hearing/understanding during the telephone conversation . Reports that she has not had a formal eye exam by an eye care professional within the past year . Reports that she has not had  a formal hearing evaluation within the past year *Unable to fully assess hearing and vision during telephone visit type  Cognitive Function: 6CIT Screen 12/08/2020 12/04/2019  What Year? 0 points 0 points  What month? 0 points 0 points  What time? 0 points 0 points  Count back from 20 0 points 0 points  Months in reverse 0 points 0 points  Repeat phrase 0 points 0 points  Total Score 0 0   (Normal:0-7, Significant for Dysfunction: >8)  Normal Cognitive Function Screening: Yes   Immunization & Health Maintenance Record Immunization History  Administered Date(s) Administered  . Fluad Quad(high Dose 65+) 09/25/2019, 10/09/2020  . Influenza, High Dose Seasonal PF 11/30/2018  . Moderna Sars-Covid-2 Vaccination 02/25/2020, 03/24/2020, 10/21/2020  . Pneumococcal Conjugate-13 03/15/2018  . Pneumococcal Polysaccharide-23 05/30/2019    Health Maintenance  Topic Date Due  . MAMMOGRAM  04/09/2021 (Originally 09/25/2020)  . TETANUS/TDAP  07/09/2021 (Originally 12/19/1970)  . COLONOSCOPY  07/17/2030  . INFLUENZA VACCINE  Completed  . DEXA SCAN  Completed  . COVID-19 Vaccine  Completed  . Hepatitis C Screening  Completed  . PNA vac Low Risk Adult  Completed       Assessment  This is a routine wellness examination for Jody Wilson.  Health Maintenance: Due or Overdue There are no preventive care reminders to display for this patient.  Jody Wilson does not need a referral for Community Assistance: Care Management:   no Social Work:    no Prescription Assistance:  no Nutrition/Diabetes Education:  no   Plan:  Personalized Goals Goals Addressed            This Visit's Progress   . DIET - INCREASE WATER INTAKE   Not on track   . DIET - REDUCE SALT INTAKE TO 2 GRAMS PER DAY OR LESS   On track   . Have 3 meals a day   No change    Try to include lean protein, vegetables, fruits and whole grains.  If you do not feel hungry between breakfast and supper- try having a  snack like fruit and nuts, protein shake, or peanut butter crackers.     . Prevent falls      . Quit Smoking   On track     Personalized Health Maintenance & Screening Recommendations   Shingles  Lung Cancer Screening Recommended: yes (Low Dose CT Chest recommended if Age 73-80 years, 30 pack-year currently smoking OR have quit w/in past 15 years) Hepatitis C Screening recommended: no HIV Screening recommended: no  Advanced Directives: Written information was not prepared per patient's request.  Referrals & Orders No orders of the defined types were placed in this encounter.   Follow-up Plan . Follow-up with Dettinger, Fransisca Kaufmann, MD as planned on 01/08/21. . Offer shingles vaccine at next visit. Marland Kitchen Pt is interested in advanced directives and will pick up from our office this week, up front for pt. . Pt is very independent. . No problems with hearing. . Vision is good with glasses, appt for new glasses in Feb 2022.       Pt has no questions for this nurse at this time. AVs printed and placed up front for pick up.   I have personally reviewed and noted the following in the patient's chart:   . Medical and social history . Use of alcohol, tobacco or illicit drugs  . Current medications and supplements . Functional ability and status . Nutritional status . Physical activity . Advanced directives . List of other physicians . Hospitalizations, surgeries, and ER visits in previous 12 months . Vitals . Screenings to include cognitive, depression, and falls . Referrals and appointments  In addition, I  have reviewed and discussed with Jody Wilson certain preventive protocols, quality metrics, and best practice recommendations. A written personalized care plan for preventive services as well as general preventive health recommendations is available and can be mailed to the patient at her request.      Rana Snare, LPN  [id number]

## 2020-12-10 ENCOUNTER — Inpatient Hospital Stay (HOSPITAL_COMMUNITY): Payer: Medicare Other | Attending: Medical

## 2020-12-10 ENCOUNTER — Other Ambulatory Visit: Payer: Self-pay

## 2020-12-10 DIAGNOSIS — E538 Deficiency of other specified B group vitamins: Secondary | ICD-10-CM | POA: Insufficient documentation

## 2020-12-10 DIAGNOSIS — Z79899 Other long term (current) drug therapy: Secondary | ICD-10-CM | POA: Insufficient documentation

## 2020-12-10 DIAGNOSIS — Z8379 Family history of other diseases of the digestive system: Secondary | ICD-10-CM | POA: Insufficient documentation

## 2020-12-10 DIAGNOSIS — F17210 Nicotine dependence, cigarettes, uncomplicated: Secondary | ICD-10-CM | POA: Insufficient documentation

## 2020-12-10 DIAGNOSIS — I10 Essential (primary) hypertension: Secondary | ICD-10-CM | POA: Diagnosis not present

## 2020-12-10 DIAGNOSIS — K449 Diaphragmatic hernia without obstruction or gangrene: Secondary | ICD-10-CM | POA: Insufficient documentation

## 2020-12-10 DIAGNOSIS — F32A Depression, unspecified: Secondary | ICD-10-CM | POA: Diagnosis not present

## 2020-12-10 DIAGNOSIS — Z8249 Family history of ischemic heart disease and other diseases of the circulatory system: Secondary | ICD-10-CM | POA: Diagnosis not present

## 2020-12-10 DIAGNOSIS — Z9049 Acquired absence of other specified parts of digestive tract: Secondary | ICD-10-CM | POA: Diagnosis not present

## 2020-12-10 DIAGNOSIS — Z833 Family history of diabetes mellitus: Secondary | ICD-10-CM | POA: Diagnosis not present

## 2020-12-10 DIAGNOSIS — K760 Fatty (change of) liver, not elsewhere classified: Secondary | ICD-10-CM | POA: Diagnosis not present

## 2020-12-10 DIAGNOSIS — R160 Hepatomegaly, not elsewhere classified: Secondary | ICD-10-CM | POA: Insufficient documentation

## 2020-12-10 DIAGNOSIS — K219 Gastro-esophageal reflux disease without esophagitis: Secondary | ICD-10-CM | POA: Insufficient documentation

## 2020-12-10 DIAGNOSIS — F419 Anxiety disorder, unspecified: Secondary | ICD-10-CM | POA: Insufficient documentation

## 2020-12-10 DIAGNOSIS — R5383 Other fatigue: Secondary | ICD-10-CM | POA: Diagnosis not present

## 2020-12-10 DIAGNOSIS — E785 Hyperlipidemia, unspecified: Secondary | ICD-10-CM | POA: Insufficient documentation

## 2020-12-10 DIAGNOSIS — D649 Anemia, unspecified: Secondary | ICD-10-CM | POA: Insufficient documentation

## 2020-12-10 DIAGNOSIS — M810 Age-related osteoporosis without current pathological fracture: Secondary | ICD-10-CM | POA: Diagnosis not present

## 2020-12-10 DIAGNOSIS — Z8349 Family history of other endocrine, nutritional and metabolic diseases: Secondary | ICD-10-CM | POA: Insufficient documentation

## 2020-12-10 DIAGNOSIS — R253 Fasciculation: Secondary | ICD-10-CM | POA: Diagnosis not present

## 2020-12-10 DIAGNOSIS — D509 Iron deficiency anemia, unspecified: Secondary | ICD-10-CM

## 2020-12-10 LAB — CBC WITH DIFFERENTIAL/PLATELET
Abs Immature Granulocytes: 0.01 10*3/uL (ref 0.00–0.07)
Basophils Absolute: 0 10*3/uL (ref 0.0–0.1)
Basophils Relative: 1 %
Eosinophils Absolute: 0.1 10*3/uL (ref 0.0–0.5)
Eosinophils Relative: 2 %
HCT: 45.5 % (ref 36.0–46.0)
Hemoglobin: 15.9 g/dL — ABNORMAL HIGH (ref 12.0–15.0)
Immature Granulocytes: 0 %
Lymphocytes Relative: 32 %
Lymphs Abs: 1.7 10*3/uL (ref 0.7–4.0)
MCH: 36 pg — ABNORMAL HIGH (ref 26.0–34.0)
MCHC: 34.9 g/dL (ref 30.0–36.0)
MCV: 102.9 fL — ABNORMAL HIGH (ref 80.0–100.0)
Monocytes Absolute: 0.5 10*3/uL (ref 0.1–1.0)
Monocytes Relative: 9 %
Neutro Abs: 2.9 10*3/uL (ref 1.7–7.7)
Neutrophils Relative %: 56 %
Platelets: 209 10*3/uL (ref 150–400)
RBC: 4.42 MIL/uL (ref 3.87–5.11)
RDW: 11.7 % (ref 11.5–15.5)
WBC: 5.2 10*3/uL (ref 4.0–10.5)
nRBC: 0 % (ref 0.0–0.2)

## 2020-12-10 LAB — IRON AND TIBC
Iron: 153 ug/dL (ref 28–170)
Saturation Ratios: 44 % — ABNORMAL HIGH (ref 10.4–31.8)
TIBC: 349 ug/dL (ref 250–450)
UIBC: 196 ug/dL

## 2020-12-10 LAB — COMPREHENSIVE METABOLIC PANEL
ALT: 22 U/L (ref 0–44)
AST: 30 U/L (ref 15–41)
Albumin: 3.9 g/dL (ref 3.5–5.0)
Alkaline Phosphatase: 63 U/L (ref 38–126)
Anion gap: 9 (ref 5–15)
BUN: 15 mg/dL (ref 8–23)
CO2: 27 mmol/L (ref 22–32)
Calcium: 9.2 mg/dL (ref 8.9–10.3)
Chloride: 98 mmol/L (ref 98–111)
Creatinine, Ser: 0.69 mg/dL (ref 0.44–1.00)
GFR, Estimated: >60 mL/min (ref >60)
Glucose, Bld: 111 mg/dL — ABNORMAL HIGH (ref 70–99)
Potassium: 4 mmol/L (ref 3.5–5.1)
Sodium: 134 mmol/L — ABNORMAL LOW (ref 135–145)
Total Bilirubin: 1 mg/dL (ref 0.3–1.2)
Total Protein: 7 g/dL (ref 6.5–8.1)

## 2020-12-10 LAB — FERRITIN: Ferritin: 91 ng/mL (ref 11–307)

## 2020-12-10 LAB — VITAMIN B12: Vitamin B-12: 255 pg/mL (ref 180–914)

## 2020-12-17 DIAGNOSIS — B029 Zoster without complications: Secondary | ICD-10-CM | POA: Diagnosis not present

## 2020-12-18 ENCOUNTER — Inpatient Hospital Stay (HOSPITAL_BASED_OUTPATIENT_CLINIC_OR_DEPARTMENT_OTHER): Payer: Medicare Other | Admitting: Oncology

## 2020-12-18 ENCOUNTER — Other Ambulatory Visit: Payer: Self-pay

## 2020-12-18 VITALS — BP 147/88 | HR 67 | Temp 97.2°F | Resp 18 | Wt 150.3 lb

## 2020-12-18 DIAGNOSIS — M810 Age-related osteoporosis without current pathological fracture: Secondary | ICD-10-CM | POA: Diagnosis not present

## 2020-12-18 DIAGNOSIS — K449 Diaphragmatic hernia without obstruction or gangrene: Secondary | ICD-10-CM | POA: Diagnosis not present

## 2020-12-18 DIAGNOSIS — E538 Deficiency of other specified B group vitamins: Secondary | ICD-10-CM | POA: Diagnosis not present

## 2020-12-18 DIAGNOSIS — Z87891 Personal history of nicotine dependence: Secondary | ICD-10-CM | POA: Diagnosis not present

## 2020-12-18 DIAGNOSIS — R160 Hepatomegaly, not elsewhere classified: Secondary | ICD-10-CM | POA: Diagnosis not present

## 2020-12-18 DIAGNOSIS — K760 Fatty (change of) liver, not elsewhere classified: Secondary | ICD-10-CM | POA: Diagnosis not present

## 2020-12-18 DIAGNOSIS — D649 Anemia, unspecified: Secondary | ICD-10-CM | POA: Diagnosis not present

## 2020-12-18 NOTE — Progress Notes (Signed)
[facility] [address], [place] [address]   CLINIC:  Medical Oncology/Hematology  PCP:  Dettinger, Elige Radon, MD [address] [place] / [place] [address]  [phone]  REASON FOR VISIT:  Follow-up for normocytic anemia  PRIOR THERAPY: None  CURRENT THERAPY: Intermittent Feraheme, daily B12 and folic acid supplements  INTERVAL HISTORY:  [name], a 69 y.o. female, returns for routine follow-up for her normocytic anemia. Jody Wilson was last seen on 07/28/2020.  She last received IV iron on 03/17/2020.  Had LDCT scan on 04/25/2020 which was negative.  Incidentally showed hiatal hernia and worrisome infiltrating mass seen in the liver.  Hepatic steatosis could also have similar appearance.  Follow-up abdominal MRI revealed no worrisome lesion but indicated fatty liver.  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   Evaluated by cardiology and found to have a positive stress test. Had left heart cath on 09/30/20.  No worrisome findings.  She was evaluated by [facility] on 09/16/20 for facial twitching. She was started on Xanax and Keppra at bedtime.  She has been taking vitamin B12 daily.  Today, she is doing well.  Endorses mild fatigue.  Admits to being treated for shingles with Valtrex and Bactroban ointment.   REVIEW OF SYSTEMS:  Review of Systems  Constitutional: Positive for fatigue.  Skin: Positive for itching (Shingles to right nostril) and wound.    PAST MEDICAL/SURGICAL HISTORY:  Past Medical History:  Diagnosis Date  . Anemia   . Anxiety   . Aortic atherosclerosis (HCC)   . Depression   . GERD (gastroesophageal reflux disease)   . Hyperlipidemia   . Hypertension   . Orthostatic hypotension   . Osteoporosis    Past Surgical History:  Procedure Laterality Date  . ABDOMINAL HYSTERECTOMY    . BACK SURGERY    . CHOLECYSTECTOMY    . FOOT SURGERY     7 in  the past  . IR GENERIC HISTORICAL  12/09/2016   IR RADIOLOGIST EVAL & MGMT 12/09/2016 [facility]INTER[facility]  . IR GENERIC HISTORICAL  12/10/2016   IR VERTEBROPLASTY CERV/THOR BX INC UNI/BIL INC/INJECT/IMAGING 12/10/2016 [facility]  . LEFT HEART CATH AND CORONARY ANGIOGRAPHY N/A 09/30/2020   Procedure: LEFT HEART CATH AND CORONARY ANGIOGRAPHY;  Surgeon: Elder Negus, MD;  Location: [facility] INVASIVE CV LAB;  Service: Cardiovascular;  Laterality: N/A;  . TRACHEOSTOMY TUBE PLACEMENT  1982    SOCIAL HISTORY:  Social History   Socioeconomic History  . Marital status: Married    Spouse name: Donnie  . Number of children: 2  . Years of education: 20  . Highest education level: Associate degree: occupational, Scientist, product/process development, or vocational program  Occupational History  . 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  Tobacco Use  . Smoking status: Current Every Day Smoker    Packs/day: 0.25    Types: Cigarettes  . Smokeless tobacco: Never Used  . Tobacco comment: Trying to cut on number of cigarettes she smokes.  Vaping Use  . Vaping Use: Never used  Substance and Sexual Activity  . Alcohol use: Not Currently    Comment: occassionally   . Drug use: No  . Sexual activity: Not on file  Other Topics Concern  . Not on file  Social History Narrative   Lives with her husband, Moise Boring, and has two grown sons and two grand sons.  She visits family and has a once weekly outing with a friend for socializing.  She enjoys making beaded jewelry, reading and walking..   Social Determinants of Health   Financial Resource Strain: Not on file  Food Insecurity: Not on file  Transportation Needs: No Transportation Needs  . Lack of Transportation (Medical): No  . Lack of Transportation (Non-Medical): No  Physical Activity: Insufficiently Active  . Days of Exercise per Week: 2 days  . Minutes of Exercise per Session: 20 min  Stress: Not on file   Social Connections: Not on file  Intimate Partner Violence: Not At Risk  . Fear of Current or Ex-Partner: No  . Emotionally Abused: No  . Physically Abused: No  . Sexually Abused: No    FAMILY HISTORY:  Family History  Problem Relation Age of Onset  . Heart disease Mother   . Hypertension Mother   . Hyperlipidemia Mother   . Diabetes Mother   . Congestive Heart Failure Mother   . Hypertension Son   . Other Son 74       Kienbock's Disease  . Liver disease Son 32       Gilbert's Disease  . Aneurysm Paternal Uncle   . Heart attack Maternal Grandmother   . Heart attack Maternal Grandfather   . Lupus Half-Sister   . Atrial fibrillation Half-Sister     CURRENT MEDICATIONS:  Current Outpatient Medications  Medication Sig Dispense Refill  . albuterol (VENTOLIN HFA) 108 (90 Base) MCG/ACT inhaler 1 puff as needed    . aspirin EC 81 MG tablet Take 1 tablet (81 mg total) by mouth daily. Swallow whole. 90 tablet 3  . bifidobacterium infantis (ALIGN) capsule See admin instructions.    . calcium-vitamin D (OSCAL WITH D) 500-200 MG-UNIT tablet Take 1 tablet by mouth daily with lunch.     . clobetasol cream (TEMOVATE) [id number] % 1 application    . cyanocobalamin (,VITAMIN B-12,) 1000 MCG/ML injection Inject 1,000 mcg into the muscle every 28 (twenty-eight) days.     Marland Kitchen denosumab (PROLIA) 60 MG/ML SOSY injection Inject 60 mg into the skin every 6 (six) months. 2 mL 0  . estradiol (ESTRACE) 0.1 MG/GM vaginal cream     . folic acid (FOLVITE) 1 MG tablet Take 1 mg by mouth daily.    . hydrochlorothiazide (MICROZIDE) 12.5 MG capsule Take 1 capsule (12.5 mg total) by mouth daily. 90 capsule 3  . levETIRAcetam (KEPPRA) 250 MG tablet Take 1 tablet (250 mg total) by mouth at bedtime. 30 tablet 3  . Multiple Vitamins-Minerals (WOMENS 50+ MULTI VITAMIN/MIN PO) Take 1 tablet by mouth every Monday, Tuesday, Wednesday, Thursday, and Friday.     . mupirocin ointment (BACTROBAN) 2 % Apply 1 application  topically 3 (three) times daily.    Marland Kitchen olmesartan (BENICAR) 20 MG tablet Take 1 tablet (20 mg total) by mouth in the morning and at bedtime. 180 tablet 3  . olmesartan-hydrochlorothiazide (BENICAR HCT) 40-25 MG tablet 1 tablet    . omeprazole (PRILOSEC) 20 MG capsule 1 capsule    . pantoprazole (PROTONIX) 40 MG tablet Take 1 tablet (40 mg total) by mouth daily. 90 tablet 3  . pravastatin (PRAVACHOL) 20 MG tablet Take 1 tablet (20 mg total) by mouth daily. (Patient taking differently: Take 20 mg by mouth at bedtime.) 90 tablet 1  . sertraline (ZOLOFT) 100 MG tablet 1 tablet    . tiZANidine (ZANAFLEX) 4 MG tablet 1 tablet as needed    . tolterodine (DETROL  LA) 4 MG 24 hr capsule 1 capsule    . valACYclovir (VALTREX) 1000 MG tablet Take 2 tablets (2,000 mg total) by mouth 2 (two) times daily. For 2 days (Patient taking differently: Take 1,000 mg by mouth 3 (three) times daily.) 8 tablet 10  . ALPRAZolam (XANAX) 0.5 MG tablet Take 1 tablet (0.5 mg total) by mouth 2 (two) times daily as needed for anxiety. (Patient not taking: Reported on 12/18/2020) 60 tablet 2   Current Facility-Administered Medications  Medication Dose Route Frequency Provider Last Rate Last Admin  . denosumab (PROLIA) injection 60 mg  60 mg Subcutaneous Q6 months Dettinger, Fransisca Kaufmann, MD   60 mg at 11/14/19 1553    ALLERGIES:  Allergies  Allergen Reactions  . Tetracyclines & Related Itching and Swelling    PHYSICAL EXAM:  Performance status (ECOG): 1 - Symptomatic but completely ambulatory  Vitals:   12/18/20 0949  BP: (!) 147/88  Pulse: 67  Resp: 18  Temp: (!) 97.2 F (36.2 C)  SpO2: 97%   Wt Readings from Last 3 Encounters:  12/18/20 150 lb 4.8 oz (68.2 kg)  10/28/20 151 lb (68.5 kg)  10/09/20 150 lb (68 kg)   Physical Exam Vitals reviewed.  Constitutional:      Appearance: Normal appearance.  Cardiovascular:     Rate and Rhythm: Normal rate and regular rhythm.     Pulses: Normal pulses.     Heart  sounds: Normal heart sounds.  Pulmonary:     Effort: Pulmonary effort is normal.     Breath sounds: Normal breath sounds.  Chest:  Breasts:     Right: No axillary adenopathy or supraclavicular adenopathy.     Left: No axillary adenopathy or supraclavicular adenopathy.    Lymphadenopathy:     Cervical: No cervical adenopathy.     Upper Body:     Right upper body: No supraclavicular or axillary adenopathy.     Left upper body: No supraclavicular or axillary adenopathy.  Skin:    Findings: Rash and wound present. Rash is crusting.     Comments: Right nostril   Neurological:     General: No focal deficit present.     Mental Status: She is alert and oriented to person, place, and time.  Psychiatric:        Mood and Affect: Mood normal.        Behavior: Behavior normal.     LABORATORY DATA:  I have reviewed the labs as listed.  CBC Latest Ref Rng & Units 12/10/2020 09/23/2020 07/11/2020  WBC 4.0 - 10.5 K/uL 5.2 9.1 8.2  Hemoglobin 12.0 - 15.0 g/dL 15.9(H) 17.2(H) 16.0(H)  Hematocrit 36.0 - 46.0 % 45.5 48.6(H) 46.4(H)  Platelets 150 - 400 K/uL 209 316 265   CMP Latest Ref Rng & Units 12/10/2020 09/23/2020 07/11/2020  Glucose 70 - 99 mg/dL 111(H) 98 95  BUN 8 - 23 mg/dL 15 13 16   Creatinine 0.44 - 1.00 mg/dL 0.69 0.60 0.61  Sodium 135 - 145 mmol/L 134(L) 141 137  Potassium 3.5 - 5.1 mmol/L 4.0 3.9 3.5  Chloride 98 - 111 mmol/L 98 101 103  CO2 22 - 32 mmol/L 27 28 23   Calcium 8.9 - 10.3 mg/dL 9.2 9.6 9.0  Total Protein 6.5 - 8.1 g/dL 7.0 - 6.9  Total Bilirubin 0.3 - 1.2 mg/dL 1.0 - 0.5  Alkaline Phos 38 - 126 U/L 63 - 56  AST 15 - 41 U/L 30 - 31  ALT 0 - 44 U/L 22 -  22      Component Value Date/Time   RBC 4.42 12/10/2020 1011   MCV 102.9 (H) 12/10/2020 1011   MCV 103 (H) 09/23/2020 1347   MCH 36.0 (H) 12/10/2020 1011   MCHC 34.9 12/10/2020 1011   RDW 11.7 12/10/2020 1011   RDW 11.7 09/23/2020 1347   LYMPHSABS 1.7 12/10/2020 1011   LYMPHSABS 1.7 09/07/2019 0903    MONOABS 0.5 12/10/2020 1011   EOSABS 0.1 12/10/2020 1011   EOSABS 0.1 09/07/2019 0903   BASOSABS 0.0 12/10/2020 1011   BASOSABS 0.1 09/07/2019 0903   Lab Results  Component Value Date   TIBC 349 12/10/2020   TIBC 353 07/10/2020   TIBC 315 04/02/2020   FERRITIN 91 12/10/2020   FERRITIN 113 07/10/2020   FERRITIN 284 04/02/2020   IRONPCTSAT 44 (H) 12/10/2020   IRONPCTSAT 55 (H) 07/10/2020   IRONPCTSAT 50 (H) 04/02/2020    DIAGNOSTIC IMAGING:  I have independently reviewed the scans and discussed with the patient. No results found.   ASSESSMENT:  1.  Normocytic anemia: -Last colonoscopy by Dr. Dulce Sellar to [facility] in [place] in 2015, small polyp removed. -She received Feraheme infusion on 03/17/2020.  2.  Osteoporosis: -DEXA scan on 01/31/2017 showed T score of -2.9.  She is tolerating Prolia very well.  3.  Tobacco abuse: -She smoked 1 pack/day for 40 years. -CT lung cancer screening scan on 04/25/2020 was lung RADS 1S.    PLAN:  1.  Normocytic anemia: -Denies any bleeding per rectum or melena. -Reportedly underwent colonoscopy and EGD by Dr. Dulce Sellar recently.  I could not locate the pathology reports. -CBC from 12/10/2020 show a hemoglobin of 15.9 and MCV of 102.9.  Ferritin is 91 with percent saturation of 44. -No additional IV iron needed. -Return to clinic in 4 months with repeat labs.   2.  Vitamin B12 deficiency: -B12 level was 255.   -Continue parenteral iron therapy.  -Patient self administers at home.  3.  Folic acid deficiency: -Continue folic acid 1 mg daily.  4.  Osteoporosis: -Continue Prolia every 6 months.  Calcium is within normal limits.  5.  Tobacco abuse: -We will plan to repeat another LDCT scan in May 2022.  Disposition: -LDCT scan in May 2022 -RTC in 4 months with repeat labs (CBC, CMP, iron, ferritin, vitamin B12) and MD assessment.  Orders placed this encounter:  Orders Placed This Encounter  Procedures  . CT CHEST  LUNG CA SCREEN LOW DOSE W/O CM   Greater than 50% was spent in counseling and coordination of care with this patient including but not limited to discussion of the relevant topics above (See A&P) including, but not limited to diagnosis and management of acute and chronic medical conditions.   Durenda Hurt, NP 12/18/2020 12:59 PM  [facility] [phone]

## 2020-12-29 ENCOUNTER — Other Ambulatory Visit: Payer: Self-pay

## 2020-12-29 MED ORDER — AMOXICILLIN-POT CLAVULANATE 875-125 MG PO TABS: 1.0000 | ORAL_TABLET | Freq: Two times a day (BID) | ORAL | 0 refills | Status: DC

## 2021-01-02 ENCOUNTER — Telehealth: Payer: Self-pay

## 2021-01-02 NOTE — Telephone Encounter (Signed)
Prolia is here. Pt scheduled at 11:30 next Thursday, January 20th. She also has an appt with Dettinger the same day at 10:30.

## 2021-01-02 NOTE — Telephone Encounter (Signed)
Patient is calling to check on Prolia injection.  Said she is several months past due.  Please advise.

## 2021-01-03 ENCOUNTER — Other Ambulatory Visit: Payer: Self-pay | Admitting: Family Medicine

## 2021-01-03 DIAGNOSIS — K219 Gastro-esophageal reflux disease without esophagitis: Secondary | ICD-10-CM

## 2021-01-08 ENCOUNTER — Encounter: Payer: Self-pay | Admitting: Family Medicine

## 2021-01-08 ENCOUNTER — Ambulatory Visit: Payer: Medicare Other

## 2021-01-08 ENCOUNTER — Ambulatory Visit (INDEPENDENT_AMBULATORY_CARE_PROVIDER_SITE_OTHER): Payer: Medicare Other | Admitting: Family Medicine

## 2021-01-08 ENCOUNTER — Other Ambulatory Visit: Payer: Self-pay

## 2021-01-08 VITALS — BP 133/78 | HR 62 | Ht 63.0 in | Wt 154.0 lb

## 2021-01-08 DIAGNOSIS — I10 Essential (primary) hypertension: Secondary | ICD-10-CM | POA: Diagnosis not present

## 2021-01-08 DIAGNOSIS — E785 Hyperlipidemia, unspecified: Secondary | ICD-10-CM | POA: Diagnosis not present

## 2021-01-08 DIAGNOSIS — M810 Age-related osteoporosis without current pathological fracture: Secondary | ICD-10-CM

## 2021-01-08 DIAGNOSIS — F419 Anxiety disorder, unspecified: Secondary | ICD-10-CM

## 2021-01-08 DIAGNOSIS — F32A Depression, unspecified: Secondary | ICD-10-CM | POA: Diagnosis not present

## 2021-01-08 MED ORDER — ALPRAZOLAM 0.5 MG PO TABS: 0.5000 mg | ORAL_TABLET | Freq: Two times a day (BID) | ORAL | 2 refills | Status: DC | PRN

## 2021-01-08 MED ORDER — HYDROCHLOROTHIAZIDE 12.5 MG PO CAPS: 12.5000 mg | ORAL_CAPSULE | Freq: Every day | ORAL | 3 refills | Status: DC

## 2021-01-08 MED ORDER — OLMESARTAN MEDOXOMIL 20 MG PO TABS: 20.0000 mg | ORAL_TABLET | Freq: Two times a day (BID) | ORAL | 3 refills | Status: DC

## 2021-01-08 NOTE — Progress Notes (Signed)
BP 133/78   Pulse 62   Ht 5\' 3"  (1.6 m)   Wt 154 lb (69.9 kg)   SpO2 96%   BMI 27.28 kg/m    Subjective:   Patient ID: Jody Wilson, female    DOB: [birth year], 70 y.o.   MRN: [id number]  HPI: Jody Wilson is a 70 y.o. female presenting on 01/08/2021 for Medical Management of Chronic Issues and Hyperlipidemia   HPI Hypertension Patient is currently on Benicar 20 mg twice daily and hydrochlorothiazide 12.5 mg daily, and their blood pressure today is 133/78. Patient denies any lightheadedness or dizziness. Patient denies headaches, blurred vision, chest pains, shortness of breath, or weakness. Denies any side effects from medication and is content with current medication.   Hyperlipidemia Patient is coming in for recheck of his hyperlipidemia. The patient is currently taking pravastatin. They deny any issues with myalgias or history of liver damage from it. They deny any focal numbness or weakness or chest pain.   Anxiety depression Current rx-Xanax 0.5 mg twice daily as needed # meds rx-60 Effectiveness of current meds-works well Adverse reactions form meds-none  Pill count performed-No Last drug screen -07/15/2020 ( high risk q43m, moderate risk q78m, low risk yearly ) Urine drug screen today- No Was the [place] reviewed-yes  If yes were their any concerning findings? -None  No flowsheet data found.   Controlled substance contract signed on: 07/15/2020  Relevant past medical, surgical, family and social history reviewed and updated as indicated. Interim medical history since our last visit reviewed. Allergies and medications reviewed and updated.  Review of Systems  Constitutional: Negative for chills and fever.  Eyes: Negative for visual disturbance.  Respiratory: Negative for chest tightness and shortness of breath.   Cardiovascular: Negative for chest pain and leg swelling.  Musculoskeletal: Negative for back pain and gait problem.  Skin: Negative for rash.   Neurological: Negative for light-headedness and headaches.  Psychiatric/Behavioral: Negative for agitation, behavioral problems, dysphoric mood and sleep disturbance. The patient is nervous/anxious.   All other systems reviewed and are negative.   Per HPI unless specifically indicated above   Allergies as of 01/08/2021      Reactions   Tetracyclines & Related Itching, Swelling      Medication List       Accurate as of January 08, 2021 11:02 AM. If you have any questions, ask your nurse or doctor.        STOP taking these medications   amoxicillin-clavulanate 875-125 MG tablet Commonly known as: AUGMENTIN Stopped by: Fransisca Kaufmann Teagen Bucio, MD   estradiol 0.1 MG/GM vaginal cream Commonly known as: ESTRACE Stopped by: Worthy Rancher, MD   olmesartan 20 MG tablet Commonly known as: BENICAR Stopped by: Fransisca Kaufmann Mohogany Toppins, MD   omeprazole 20 MG capsule Commonly known as: PRILOSEC Stopped by: Worthy Rancher, MD     TAKE these medications   albuterol 108 (90 Base) MCG/ACT inhaler Commonly known as: VENTOLIN HFA 1 puff as needed   ALPRAZolam 0.5 MG tablet Commonly known as: XANAX Take 1 tablet (0.5 mg total) by mouth 2 (two) times daily as needed for anxiety.   aspirin EC 81 MG tablet Take 1 tablet (81 mg total) by mouth daily. Swallow whole.   bifidobacterium infantis capsule See admin instructions.   calcium-vitamin D 500-200 MG-UNIT tablet Commonly known as: OSCAL WITH D Take 1 tablet by mouth daily with lunch.   clobetasol cream 0.05 % Commonly known as: TEMOVATE 1 application  cyanocobalamin 1000 MCG/ML injection Commonly known as: (VITAMIN B-12) Inject 1,000 mcg into the muscle every 28 (twenty-eight) days.   denosumab 60 MG/ML Sosy injection Commonly known as: PROLIA Inject 60 mg into the skin every 6 (six) months.   folic acid 1 MG tablet Commonly known as: FOLVITE Take 1 mg by mouth daily.   hydrochlorothiazide 12.5 MG capsule Commonly  known as: MICROZIDE Take 1 capsule (12.5 mg total) by mouth daily.   levETIRAcetam 250 MG tablet Commonly known as: Keppra Take 1 tablet (250 mg total) by mouth at bedtime.   mupirocin ointment 2 % Commonly known as: BACTROBAN Apply 1 application topically 3 (three) times daily.   olmesartan-hydrochlorothiazide 40-25 MG tablet Commonly known as: BENICAR HCT 1 tablet   pantoprazole 40 MG tablet Commonly known as: PROTONIX TAKE 1 TABLET DAILY   pravastatin 20 MG tablet Commonly known as: PRAVACHOL Take 1 tablet (20 mg total) by mouth daily. What changed: when to take this   sertraline 100 MG tablet Commonly known as: ZOLOFT 1 tablet   tiZANidine 4 MG tablet Commonly known as: ZANAFLEX 1 tablet as needed   tolterodine 4 MG 24 hr capsule Commonly known as: DETROL LA 1 capsule   valACYclovir 1000 MG tablet Commonly known as: VALTREX Take 2 tablets (2,000 mg total) by mouth 2 (two) times daily. For 2 days What changed:   how much to take  when to take this  additional instructions   WOMENS 50+ MULTI VITAMIN/MIN PO Take 1 tablet by mouth every Monday, Tuesday, Wednesday, Thursday, and Friday.        Objective:   BP 133/78   Pulse 62   Ht 5\' 3"  (1.6 m)   Wt 154 lb (69.9 kg)   SpO2 96%   BMI 27.28 kg/m   Wt Readings from Last 3 Encounters:  01/08/21 154 lb (69.9 kg)  12/18/20 150 lb 4.8 oz (68.2 kg)  10/28/20 151 lb (68.5 kg)    Physical Exam Vitals and nursing note reviewed.  Constitutional:      General: She is not in acute distress.    Appearance: She is well-developed and well-nourished. She is not diaphoretic.  Eyes:     Extraocular Movements: EOM normal.     Conjunctiva/sclera: Conjunctivae normal.  Cardiovascular:     Rate and Rhythm: Normal rate and regular rhythm.     Pulses: Intact distal pulses.     Heart sounds: Normal heart sounds. No murmur heard.   Pulmonary:     Effort: Pulmonary effort is normal. No respiratory distress.      Breath sounds: Normal breath sounds. No wheezing.  Musculoskeletal:        General: No tenderness or edema. Normal range of motion.  Skin:    General: Skin is warm and dry.     Findings: No rash.  Neurological:     Mental Status: She is alert and oriented to person, place, and time.     Coordination: Coordination normal.  Psychiatric:        Mood and Affect: Mood is anxious. Mood is not depressed.        Behavior: Behavior normal.        Thought Content: Thought content does not include suicidal ideation. Thought content does not include suicidal plan.       Assessment & Plan:   Problem List Items Addressed This Visit      Cardiovascular and Mediastinum   Hypertension - Primary   Relevant Medications   hydrochlorothiazide (  MICROZIDE) 12.5 MG capsule   olmesartan (BENICAR) 20 MG tablet     Other   Hyperlipidemia with target LDL less than 100   Relevant Medications   hydrochlorothiazide (MICROZIDE) 12.5 MG capsule   olmesartan (BENICAR) 20 MG tablet   Anxiety and depression   Relevant Medications   ALPRAZolam (XANAX) 0.5 MG tablet    Other Visit Diagnoses    Essential hypertension       Relevant Medications   hydrochlorothiazide (MICROZIDE) 12.5 MG capsule   olmesartan (BENICAR) 20 MG tablet      Patient seems to be doing well on current medication, blood pressure looks good, no changes.  Follow-up in 3 months. Follow up plan: Return in about 3 months (around 04/08/2021), or if symptoms worsen or fail to improve, for Hypertension and cholesterol and anxiety recheck.  Counseling provided for all of the vaccine components No orders of the defined types were placed in this encounter.   [name], MD [facility] 01/08/2021, 11:02 AM

## 2021-01-30 ENCOUNTER — Encounter: Payer: Self-pay | Admitting: *Deleted

## 2021-02-27 ENCOUNTER — Telehealth: Payer: Self-pay | Admitting: Family Medicine

## 2021-02-27 DIAGNOSIS — I10 Essential (primary) hypertension: Secondary | ICD-10-CM

## 2021-02-27 MED ORDER — OLMESARTAN MEDOXOMIL 40 MG PO TABS: 40.0000 mg | ORAL_TABLET | Freq: Every day | ORAL | 1 refills | Status: DC

## 2021-02-27 NOTE — Telephone Encounter (Signed)
Pt wants to discuss the amount she is taking of medications

## 2021-02-27 NOTE — Telephone Encounter (Signed)
Insurance will not cover 20 mg benicar BID   We had to change it back to 40 mg one a day  And she will cont cutting them in 1/2 and taking 1/2 tab BID

## 2021-03-03 ENCOUNTER — Ambulatory Visit: Payer: Medicare Other | Admitting: Neurology

## 2021-03-18 DIAGNOSIS — R197 Diarrhea, unspecified: Secondary | ICD-10-CM | POA: Diagnosis not present

## 2021-03-18 DIAGNOSIS — R634 Abnormal weight loss: Secondary | ICD-10-CM | POA: Diagnosis not present

## 2021-03-18 DIAGNOSIS — Z86010 Personal history of colonic polyps: Secondary | ICD-10-CM | POA: Diagnosis not present

## 2021-03-24 ENCOUNTER — Other Ambulatory Visit: Payer: Self-pay | Admitting: Family Medicine

## 2021-04-08 ENCOUNTER — Ambulatory Visit: Payer: Medicare Other | Admitting: Family Medicine

## 2021-04-14 ENCOUNTER — Ambulatory Visit (INDEPENDENT_AMBULATORY_CARE_PROVIDER_SITE_OTHER): Payer: Medicare Other | Admitting: Family Medicine

## 2021-04-14 ENCOUNTER — Encounter: Payer: Self-pay | Admitting: Family Medicine

## 2021-04-14 ENCOUNTER — Other Ambulatory Visit: Payer: Self-pay

## 2021-04-14 VITALS — BP 140/80 | HR 60 | Ht 63.0 in | Wt 153.0 lb

## 2021-04-14 DIAGNOSIS — F419 Anxiety disorder, unspecified: Secondary | ICD-10-CM

## 2021-04-14 DIAGNOSIS — F32A Depression, unspecified: Secondary | ICD-10-CM

## 2021-04-14 DIAGNOSIS — E785 Hyperlipidemia, unspecified: Secondary | ICD-10-CM | POA: Diagnosis not present

## 2021-04-14 DIAGNOSIS — I10 Essential (primary) hypertension: Secondary | ICD-10-CM | POA: Diagnosis not present

## 2021-04-14 DIAGNOSIS — N3281 Overactive bladder: Secondary | ICD-10-CM | POA: Diagnosis not present

## 2021-04-14 DIAGNOSIS — Z1231 Encounter for screening mammogram for malignant neoplasm of breast: Secondary | ICD-10-CM | POA: Diagnosis not present

## 2021-04-14 MED ORDER — TOLTERODINE TARTRATE ER 4 MG PO CP24: 4.0000 mg | ORAL_CAPSULE | Freq: Every day | ORAL | 5 refills | Status: DC

## 2021-04-14 MED ORDER — ALPRAZOLAM 0.5 MG PO TABS: 0.5000 mg | ORAL_TABLET | Freq: Two times a day (BID) | ORAL | 2 refills | Status: DC | PRN

## 2021-04-14 MED ORDER — PRAVASTATIN SODIUM 20 MG PO TABS: 20.0000 mg | ORAL_TABLET | Freq: Every day | ORAL | 3 refills | Status: DC

## 2021-04-14 NOTE — Progress Notes (Signed)
BP 140/80   Pulse 60   Ht 5' 3"  (1.6 m)   Wt 153 lb (69.4 kg)   SpO2 94%   BMI 27.10 kg/m    Subjective:   Patient ID: Jody Wilson, female    DOB: 1951/08/31, 70 y.o.   MRN: [id number]  HPI: Jody Wilson is a 70 y.o. female presenting on 04/14/2021 for Medical Management of Chronic Issues, Hypertension, and Hyperlipidemia   HPI Anxiety recheck Current rx-Xanax 0.5 twice daily as needed # meds rx-60 Effectiveness of current meds-works well for anxiety Adverse reactions form meds-none  Pill count performed-No Last drug screen -07/15/2020 ( high risk q57m moderate risk q653mlow risk yearly ) Urine drug screen today- No [place]  If yes were their any concerning findings? -None  No flowsheet data found.   Controlled substance contract signed on: 07/15/2020  Hypertension Patient is currently on Benicar and hydrochlorothiazide, and their blood pressure today is 140/80. Patient denies any lightheadedness or dizziness. Patient denies headaches, blurred vision, chest pains, shortness of breath, or weakness. Denies any side effects from medication and is content with current medication.   Hyperlipidemia Patient is coming in for recheck of his hyperlipidemia. The patient is currently taking pravastatin. They deny any issues with myalgias or history of liver damage from it. They deny any focal numbness or weakness or chest pain.   Patient is having some issues with sleep and says that sometimes she will sleep all day and then she will go to sleep at night and sometimes she will sleep at night and she will be able to sleep the next day.  She says it fluctuates up and down.  She denies any anxiety or depression increase.  She says her energy is down.  She says she snores sometimes per her husband but not all the time.  Patient is having some bladder incontinence issues or overactive bladder which we have discussed previously but she says she never got  Detrol.  Relevant past medical, surgical, family and social history reviewed and updated as indicated. Interim medical history since our last visit reviewed. Allergies and medications reviewed and updated.  Review of Systems  Constitutional: Negative for chills and fever.  Eyes: Negative for visual disturbance.  Respiratory: Negative for chest tightness and shortness of breath.   Cardiovascular: Negative for chest pain and leg swelling.  Genitourinary: Positive for frequency.  Musculoskeletal: Negative for back pain and gait problem.  Skin: Negative for rash.  Neurological: Negative for light-headedness and headaches.  Psychiatric/Behavioral: Positive for sleep disturbance. Negative for agitation, behavioral problems, decreased concentration and dysphoric mood. The patient is nervous/anxious.   All other systems reviewed and are negative.   Per HPI unless specifically indicated above   Allergies as of 04/14/2021      Reactions   Tetracyclines & Related Itching, Swelling      Medication List       Accurate as of April 14, 2021 11:34 AM. If you have any questions, ask your nurse or doctor.        albuterol 108 (90 Base) MCG/ACT inhaler Commonly known as: VENTOLIN HFA 1 puff as needed   ALPRAZolam 0.5 MG tablet Commonly known as: XANAX Take 1 tablet (0.5 mg total) by mouth 2 (two) times daily as needed for anxiety. Start taking on: May 08, 2021 What changed: These instructions start on May 08, 2021. If you are unsure what to do until then, ask your doctor or other care provider.  Changed by: Worthy Rancher, MD   aspirin EC 81 MG tablet Take 1 tablet (81 mg total) by mouth daily. Swallow whole.   bifidobacterium infantis capsule See admin instructions.   calcium-vitamin D 500-200 MG-UNIT tablet Commonly known as: OSCAL WITH D Take 1 tablet by mouth daily with lunch.   clobetasol cream 0.05 % Commonly known as: TEMOVATE 1 application   cyanocobalamin 1000 MCG/ML  injection Commonly known as: (VITAMIN B-12) Inject 1,000 mcg into the muscle every 28 (twenty-eight) days.   denosumab 60 MG/ML Sosy injection Commonly known as: PROLIA Inject 60 mg into the skin every 6 (six) months.   folic acid 1 MG tablet Commonly known as: FOLVITE Take 1 mg by mouth daily.   hydrochlorothiazide 12.5 MG capsule Commonly known as: MICROZIDE Take 1 capsule (12.5 mg total) by mouth daily.   levETIRAcetam 250 MG tablet Commonly known as: Keppra Take 1 tablet (250 mg total) by mouth at bedtime.   mupirocin ointment 2 % Commonly known as: BACTROBAN Apply 1 application topically 3 (three) times daily.   olmesartan 40 MG tablet Commonly known as: BENICAR Take 1 tablet (40 mg total) by mouth daily.   pantoprazole 40 MG tablet Commonly known as: PROTONIX TAKE 1 TABLET DAILY   pravastatin 20 MG tablet Commonly known as: PRAVACHOL Take 1 tablet (20 mg total) by mouth daily.   sertraline 100 MG tablet Commonly known as: ZOLOFT 1 tablet   tiZANidine 4 MG tablet Commonly known as: ZANAFLEX 1 tablet as needed   tolterodine 4 MG 24 hr capsule Commonly known as: DETROL LA Take 1 capsule (4 mg total) by mouth daily. What changed: See the new instructions. Changed by: Fransisca Kaufmann Illeana Edick, MD   valACYclovir 1000 MG tablet Commonly known as: VALTREX Take 2 tablets (2,000 mg total) by mouth 2 (two) times daily. For 2 days What changed:   how much to take  when to take this  additional instructions   WOMENS 50+ MULTI VITAMIN/MIN PO Take 1 tablet by mouth every Monday, Tuesday, Wednesday, Thursday, and Friday.        Objective:   BP 140/80   Pulse 60   Ht 5' 3"  (1.6 m)   Wt 153 lb (69.4 kg)   SpO2 94%   BMI 27.10 kg/m   Wt Readings from Last 3 Encounters:  04/14/21 153 lb (69.4 kg)  01/08/21 154 lb (69.9 kg)  12/18/20 150 lb 4.8 oz (68.2 kg)    Physical Exam Vitals and nursing note reviewed.  Constitutional:      General: She is not in  acute distress.    Appearance: She is well-developed. She is not diaphoretic.  Eyes:     Conjunctiva/sclera: Conjunctivae normal.  Cardiovascular:     Rate and Rhythm: Normal rate and regular rhythm.     Heart sounds: Normal heart sounds. No murmur heard.   Pulmonary:     Effort: Pulmonary effort is normal. No respiratory distress.     Breath sounds: Normal breath sounds. No wheezing.  Musculoskeletal:        General: No tenderness. Normal range of motion.  Skin:    General: Skin is warm and dry.     Findings: No rash.  Neurological:     Mental Status: She is alert and oriented to person, place, and time.     Coordination: Coordination normal.  Psychiatric:        Behavior: Behavior normal.       Assessment & Plan:  Problem List Items Addressed This Visit      Cardiovascular and Mediastinum   Hypertension   Relevant Medications   pravastatin (PRAVACHOL) 20 MG tablet   Other Relevant Orders   CMP14+EGFR     Other   Hyperlipidemia with target LDL less than 100   Relevant Medications   pravastatin (PRAVACHOL) 20 MG tablet   Other Relevant Orders   Lipid panel   Anxiety and depression - Primary   Relevant Medications   ALPRAZolam (XANAX) 0.5 MG tablet (Start on 05/08/2021)   Other Relevant Orders   CBC with Differential/Platelet   Thyroid Panel With TSH    Other Visit Diagnoses    Encounter for screening mammogram for malignant neoplasm of breast       Relevant Orders   MM 3D SCREEN BREAST BILATERAL   Overactive bladder       Relevant Medications   tolterodine (DETROL LA) 4 MG 24 hr capsule      Will start Detrol for overactive bladder.  Continue alprazolam and pravastatin.  No change in blood pressure medication.  Will check blood work for energy and fatigue today but may consider sleep study in the future. Follow up plan: Return in about 3 months (around 07/14/2021), or if symptoms worsen or fail to improve, for Anxiety and hypertension  recheck.  Counseling provided for all of the vaccine components Orders Placed This Encounter  Procedures  . MM 3D SCREEN BREAST BILATERAL  . CBC with Differential/Platelet  . CMP14+EGFR  . Lipid panel  . Thyroid Panel With TSH    [name], MD [facility] 04/14/2021, 11:34 AM

## 2021-04-15 LAB — CMP14+EGFR
ALT: 30 IU/L (ref 0–32)
AST: 30 IU/L (ref 0–40)
Albumin/Globulin Ratio: 1.4 (ref 1.2–2.2)
Albumin: 4.2 g/dL (ref 3.8–4.8)
Alkaline Phosphatase: 85 IU/L (ref 44–121)
BUN/Creatinine Ratio: 17 (ref 12–28)
BUN: 14 mg/dL (ref 8–27)
Bilirubin Total: 0.5 mg/dL (ref 0.0–1.2)
CO2: 27 mmol/L (ref 20–29)
Calcium: 9.7 mg/dL (ref 8.7–10.3)
Chloride: 100 mmol/L (ref 96–106)
Creatinine, Ser: 0.84 mg/dL (ref 0.57–1.00)
Globulin, Total: 3 g/dL (ref 1.5–4.5)
Glucose: 106 mg/dL — ABNORMAL HIGH (ref 65–99)
Potassium: 4.6 mmol/L (ref 3.5–5.2)
Sodium: 142 mmol/L (ref 134–144)
Total Protein: 7.2 g/dL (ref 6.0–8.5)
eGFR: 75 mL/min/{1.73_m2} (ref >59)

## 2021-04-15 LAB — THYROID PANEL WITH TSH
Free Thyroxine Index: 2.4 (ref 1.2–4.9)
T3 Uptake Ratio: 31 % (ref 24–39)
T4, Total: 7.9 ug/dL (ref 4.5–12.0)
TSH: 1.06 u[IU]/mL (ref 0.450–4.500)

## 2021-04-15 LAB — CBC WITH DIFFERENTIAL/PLATELET
Basophils Absolute: 0 10*3/uL (ref 0.0–0.2)
Basos: 0 %
EOS (ABSOLUTE): 0.1 10*3/uL (ref 0.0–0.4)
Eos: 1 %
Hematocrit: 44.7 % (ref 34.0–46.6)
Hemoglobin: 15.5 g/dL (ref 11.1–15.9)
Immature Grans (Abs): 0 10*3/uL (ref 0.0–0.1)
Immature Granulocytes: 0 %
Lymphocytes Absolute: 2.3 10*3/uL (ref 0.7–3.1)
Lymphs: 32 %
MCH: 35.3 pg — ABNORMAL HIGH (ref 26.6–33.0)
MCHC: 34.7 g/dL (ref 31.5–35.7)
MCV: 102 fL — ABNORMAL HIGH (ref 79–97)
Monocytes Absolute: 0.6 10*3/uL (ref 0.1–0.9)
Monocytes: 9 %
Neutrophils Absolute: 4.1 10*3/uL (ref 1.4–7.0)
Neutrophils: 58 %
Platelets: 279 10*3/uL (ref 150–450)
RBC: 4.39 x10E6/uL (ref 3.77–5.28)
RDW: 12.1 % (ref 11.7–15.4)
WBC: 7.1 10*3/uL (ref 3.4–10.8)

## 2021-04-15 LAB — LIPID PANEL
Chol/HDL Ratio: 2.6 ratio (ref 0.0–4.4)
Cholesterol, Total: 202 mg/dL — ABNORMAL HIGH (ref 100–199)
HDL: 77 mg/dL (ref >39)
LDL Chol Calc (NIH): 98 mg/dL (ref 0–99)
Triglycerides: 160 mg/dL — ABNORMAL HIGH (ref 0–149)
VLDL Cholesterol Cal: 27 mg/dL (ref 5–40)

## 2021-04-30 ENCOUNTER — Ambulatory Visit: Payer: Medicare Other

## 2021-04-30 ENCOUNTER — Other Ambulatory Visit: Payer: Self-pay

## 2021-04-30 DIAGNOSIS — I6523 Occlusion and stenosis of bilateral carotid arteries: Secondary | ICD-10-CM | POA: Diagnosis not present

## 2021-05-05 ENCOUNTER — Encounter: Payer: Self-pay | Admitting: *Deleted

## 2021-05-07 ENCOUNTER — Ambulatory Visit: Payer: Medicare Other | Admitting: Cardiology

## 2021-05-12 ENCOUNTER — Other Ambulatory Visit: Payer: Self-pay | Admitting: Cardiology

## 2021-05-12 DIAGNOSIS — I6521 Occlusion and stenosis of right carotid artery: Secondary | ICD-10-CM

## 2021-05-12 NOTE — Progress Notes (Signed)
Patient didn't answer left a vm to call back

## 2021-05-12 NOTE — Progress Notes (Signed)
car

## 2021-05-14 NOTE — Progress Notes (Signed)
Pt called back and was informed about her duplex results

## 2021-05-19 ENCOUNTER — Ambulatory Visit (HOSPITAL_COMMUNITY)
Admission: RE | Admit: 2021-05-19 | Discharge: 2021-05-19 | Disposition: A | Discharge status: Home / Self Care | Payer: Medicare Other | Source: Ambulatory Visit | Attending: Oncology | Admitting: Oncology

## 2021-05-19 ENCOUNTER — Other Ambulatory Visit: Payer: Self-pay

## 2021-05-19 DIAGNOSIS — Z87891 Personal history of nicotine dependence: Secondary | ICD-10-CM | POA: Diagnosis not present

## 2021-05-19 DIAGNOSIS — M79673 Pain in unspecified foot: Secondary | ICD-10-CM | POA: Diagnosis not present

## 2021-05-19 DIAGNOSIS — S92592A Other fracture of left lesser toe(s), initial encounter for closed fracture: Secondary | ICD-10-CM | POA: Diagnosis not present

## 2021-05-20 ENCOUNTER — Encounter (HOSPITAL_COMMUNITY): Payer: Self-pay

## 2021-05-20 NOTE — Progress Notes (Signed)
Patient notified of LDCT Lung Cancer Screening Results via mail with the recommendation to follow-up in 12 months. Patient's referring provider has been sent a copy of results. Results are as follows:  IMPRESSION: 1. Lung-RADS 2, benign appearance or behavior. Continue annual screening with low-dose chest CT without contrast in 12 months. 2. Moderate to large hiatal hernia. 3. Aortic Atherosclerosis (ICD10-I70.0) and Emphysema (ICD10-J43.9).

## 2021-05-25 ENCOUNTER — Encounter: Payer: Self-pay | Admitting: Cardiology

## 2021-05-25 ENCOUNTER — Other Ambulatory Visit: Payer: Self-pay

## 2021-05-25 ENCOUNTER — Ambulatory Visit: Payer: Medicare Other | Admitting: Cardiology

## 2021-05-25 VITALS — BP 113/69 | HR 80 | Temp 98.1°F | Resp 16 | Ht 63.0 in | Wt 149.8 lb

## 2021-05-25 DIAGNOSIS — E782 Mixed hyperlipidemia: Secondary | ICD-10-CM

## 2021-05-25 DIAGNOSIS — I10 Essential (primary) hypertension: Secondary | ICD-10-CM

## 2021-05-25 DIAGNOSIS — Z87891 Personal history of nicotine dependence: Secondary | ICD-10-CM

## 2021-05-25 DIAGNOSIS — I2510 Atherosclerotic heart disease of native coronary artery without angina pectoris: Secondary | ICD-10-CM

## 2021-05-25 DIAGNOSIS — I2584 Coronary atherosclerosis due to calcified coronary lesion: Secondary | ICD-10-CM | POA: Diagnosis not present

## 2021-05-25 DIAGNOSIS — I6521 Occlusion and stenosis of right carotid artery: Secondary | ICD-10-CM | POA: Diagnosis not present

## 2021-05-25 MED ORDER — EZETIMIBE 10 MG PO TABS
10.0000 mg | ORAL_TABLET | Freq: Every day | ORAL | 1 refills | Status: DC
Start: 2021-05-25 — End: 2022-01-06

## 2021-05-25 MED ORDER — PRAVASTATIN SODIUM 40 MG PO TABS: 40.0000 mg | ORAL_TABLET | Freq: Every evening | ORAL | 0 refills | Status: DC

## 2021-05-25 NOTE — Progress Notes (Signed)
Date:  05/25/2021   ID:  Jody Wilson, DOB 1951-11-15, MRN [id number]  PCP:  Jody Wilson, Jody Kaufmann, MD  Cardiologist:  Rex Kras, DO, [facility] (established care 07/29/2020)  Date: 05/25/21 Last Office Visit: 10/28/2020  Chief Complaint  Patient presents with  . Coronary atherosclerosis due to calcified coronary lesion o  . Follow-up    HPI  Jody Wilson is a 70 y.o. female who presents to the office with a chief complaint of " 7 month follow-up." Her past medical history and cardiovascular risk factors include: hypertension, hyperlipidemia, GERD, former smoker, Thoracic aortic atherosclerosis, coronary artery calcification, advanced age, postmenopausal female.  Patient is referred to the office at the request of her primary care provider for evaluation of syncope.  Patient was initially referred to the office for evaluation of syncope.  Given her recurrence of events she underwent an ischemic evaluation.  Carotid duplex noted asymptomatic carotid artery stenosis less than 50%, exercise stress test was noted to be intermediate risk study, invasive angiography noted normal coronary arteries.  At the last office visit she was encouraged to work on improving her modifiable cardiovascular risk factors for better longitudinal care.  Patient states that her blood pressures are better controlled.  She has stopped smoking as of May 16, 2021 and has noted significant improvement with her breathing.  No hospitalizations or urgent care visits for cardiovascular symptoms.  She is free of chest pain and shortness of breath.  FUNCTIONAL STATUS: Walks 76mn everyday 4 days a week.    ALLERGIES: Allergies  Allergen Reactions  . Tetracyclines & Related Itching and Swelling    MEDICATION LIST PRIOR TO VISIT: Current Meds  Medication Sig  . ALPRAZolam (XANAX) 0.5 MG tablet Take 1 tablet (0.5 mg total) by mouth 2 (two) times daily as needed for anxiety.  .Marland Kitchenaspirin EC 81 MG tablet Take 1 tablet  (81 mg total) by mouth daily. Swallow whole.  . calcium-vitamin D (OSCAL WITH D) 500-200 MG-UNIT tablet Take 1 tablet by mouth daily with lunch.   . clobetasol cream (TEMOVATE) 03.61% 1 application  . cyanocobalamin (,VITAMIN B-12,) 1000 MCG/ML injection Inject 1,000 mcg into the muscle every 28 (twenty-eight) days.   .Marland Kitchenezetimibe (ZETIA) 10 MG tablet Take 1 tablet (10 mg total) by mouth daily.  . folic acid (FOLVITE) 1 MG tablet Take 1 mg by mouth daily.  . hydrochlorothiazide (MICROZIDE) 12.5 MG capsule Take 1 capsule (12.5 mg total) by mouth daily.  . Multiple Vitamins-Minerals (WOMENS 50+ MULTI VITAMIN/MIN PO) Take 1 tablet by mouth every Monday, Tuesday, Wednesday, Thursday, and Friday.   . mupirocin ointment (BACTROBAN) 2 % Apply 1 application topically 3 (three) times daily.  .Marland Kitchenolmesartan (BENICAR) 40 MG tablet Take 1 tablet (40 mg total) by mouth daily.  . pantoprazole (PROTONIX) 40 MG tablet TAKE 1 TABLET DAILY  . pravastatin (PRAVACHOL) 40 MG tablet Take 1 tablet (40 mg total) by mouth every evening.  .Marland KitchentiZANidine (ZANAFLEX) 4 MG tablet 1 tablet as needed  . tolterodine (DETROL LA) 4 MG 24 hr capsule Take 1 capsule (4 mg total) by mouth daily.  . valACYclovir (VALTREX) 1000 MG tablet Take 2 tablets (2,000 mg total) by mouth 2 (two) times daily. For 2 days (Patient taking differently: Take 1,000 mg by mouth 3 (three) times daily.)  . [DISCONTINUED] pravastatin (PRAVACHOL) 20 MG tablet Take 1 tablet (20 mg total) by mouth daily.   Current Facility-Administered Medications for the 05/25/21 encounter (Office Visit) with TRex Kras  DO  Medication  . denosumab (PROLIA) injection 60 mg     PAST MEDICAL HISTORY: Past Medical History:  Diagnosis Date  . Anemia   . Anxiety   . Aortic atherosclerosis ([place])   . Depression   . GERD (gastroesophageal reflux disease)   . Hyperlipidemia   . Hypertension   . Orthostatic hypotension   . Osteoporosis     PAST SURGICAL HISTORY: Past  Surgical History:  Procedure Laterality Date  . ABDOMINAL HYSTERECTOMY    . BACK SURGERY    . CHOLECYSTECTOMY    . FOOT SURGERY     7 in the past  . IR GENERIC HISTORICAL  12/09/2016   IR RADIOLOGIST EVAL & MGMT 12/09/2016 [facility]  . IR GENERIC HISTORICAL  12/10/2016   IR VERTEBROPLASTY CERV/THOR BX INC UNI/BIL INC/INJECT/IMAGING 12/10/2016 [facility]  . LEFT HEART CATH AND CORONARY ANGIOGRAPHY N/A 09/30/2020   Procedure: LEFT HEART CATH AND CORONARY ANGIOGRAPHY;  Surgeon: Nigel Mormon, MD;  Location: [place] [facility];  Service: Cardiovascular;  Laterality: N/A;  . TRACHEOSTOMY TUBE PLACEMENT  1982    FAMILY HISTORY: The patient family history includes Aneurysm in her paternal uncle; Atrial fibrillation in her half-sister; Congestive Heart Failure in her mother; Diabetes in her mother; Heart attack in her maternal grandfather and maternal grandmother; Heart disease in her mother; Hyperlipidemia in her mother; Hypertension in her mother and son; Liver disease (age of onset: 59) in her son; Lupus in her half-sister; Other (age of onset: 44) in her son.  SOCIAL HISTORY:  The patient  reports that she has been smoking cigarettes. She has been smoking about 0.25 packs per day. She has never used smokeless tobacco. She reports previous alcohol use. She reports that she does not use drugs.  REVIEW OF SYSTEMS: Review of Systems  Constitutional: Negative for chills and fever.  HENT: Negative for hoarse voice and nosebleeds.   Eyes: Negative for discharge, double vision and pain.  Cardiovascular: Positive for dyspnea on exertion. Negative for chest pain, claudication, leg swelling, near-syncope, orthopnea, palpitations, paroxysmal nocturnal dyspnea and syncope.  Respiratory: Negative for hemoptysis and shortness of breath.   Musculoskeletal: Negative for muscle cramps and myalgias.  Gastrointestinal: Negative for abdominal pain, constipation, diarrhea, hematemesis,  hematochezia, melena, nausea and vomiting.  Neurological: Negative for dizziness and light-headedness.    PHYSICAL EXAM: Vitals with BMI 05/25/2021 04/14/2021 01/08/2021  Height 5' 3"  5' 3"  5' 3"   Weight 149 lbs 13 oz 153 lbs 154 lbs  BMI 26.54 62.37 62.83  Systolic 151 761 607  Diastolic 69 80 78  Pulse 80 60 62   CONSTITUTIONAL: Well-developed and well-nourished. No acute distress.  SKIN: Skin is warm and dry. No rash noted. No cyanosis. No pallor. No jaundice HEAD: Normocephalic and atraumatic.  EYES: No scleral icterus MOUTH/THROAT: Moist oral membranes.  NECK: No JVD present. No thyromegaly noted. No carotid bruits  LYMPHATIC: No visible cervical adenopathy.  CHEST Normal respiratory effort. No intercostal retractions  LUNGS: Clear to auscultation bilaterally. No stridor. No wheezes. No rales.  CARDIOVASCULAR: Regular rate and rhythm, positive S1-S2, no murmurs rubs or gallops appreciated. ABDOMINAL: No apparent ascites.  EXTREMITIES: No peripheral edema  HEMATOLOGIC: No significant bruising NEUROLOGIC: Oriented to person, place, and time. Nonfocal. Normal muscle tone.  PSYCHIATRIC: Normal mood and affect. Normal behavior. Cooperative  RADIOLOGY:  MR Abdomen w/ and w/o contrast 06/03/2020:  1. Area of concern on prior chest CT represents geographic fat intensification on a background of global mild  hepatic steatosis. No suspicious lesion in the liver with lobular contours. Correlate with any clinical or laboratory evidence of liver disease. 2. Small focal outpouching approximately 7 x 7 mm along the distal thoracic aorta may represent a penetrating atherosclerotic ulcer not associated with acute features and unchanged since previous chest CT. Consider CT angiogram follow-up in 1 year with vascular surgery consultation to guide further follow-up. 3. Large hiatal hernia extending into the chest as on the recent CT of the chest. 4. Compression fracture at T11 better demonstrated on  recent chest CT. 5. Aortic atherosclerosis.  CT chest lung cancer screening: 04/25/2020: Mild coronary atherosclerosis of the LAD.  CARDIAC DATABASE: EKG: 05/25/2021: Normal sinus rhythm, 76 bpm, normal axis, without underlying ischemia or injury pattern.  Echocardiogram: 06/23/2020: LVEF 60-65%, indeterminate diastolic parameters, no pulmonary hypertension, RVSP 20 mmHg mild MR, aortic valve sclerosis without stenosis.  Stress Testing: Exercise Myoview stress test 08/13/2020: Exercise nuclear stress test was performed using Bruce protocol. Patient reached 5.0 METS, and 92% of age predicted maximum heart rate. Exercise capacity was low. Chest pain not reported. Exertional dyspnea and dizziness reported. Heart rate and hemodynamic response were normal Stress EKG demonstrated sinus tachycardia, 1.5-2 mm horizontal/upsloping ST depression in leads II, III, aVF, V5, V6, normalizing 2 min into recovery. EKG changes are positive for ischemia.  Normal myocardial perfusion. Stress LVEF 73%. Intermediate risk study due to EKG changes, low exercise capacity.   Heart Catheterization: 09/30/2020: Right dominant circulation Normal coronary arteries Normal LVEDP  Carotid duplex: 06/23/2020:  Right Carotid: Velocities in the right ICA are consistent with a 1-39% stenosis. Left Carotid: Velocities in the left ICA are consistent with a 1-39% stenosis. Vertebrals: Bilateral vertebral arteries demonstrate antegrade flow.   04/30/2021: Doppler velocity suggests stenosis in the right internal carotid artery (50-69%), lower limit of the spectrum. Doppler velocity suggests stenosis in the left internal carotid artery (1-15%). Antegrade right vertebral artery flow. Antegrade left vertebral artery flow. Compared to 06/23/2020, right ICA stenosis is new. Follow up in six months is appropriate if clinically indicated.  14 day extended Holter monitor: Dominant rhythm normal sinus. Heart rate 52 -150bpm.  Avg HR 73  bpm. No atrial fibrillation, ventricular tachycardia, high grade AV block, pauses (3 seconds or longer). Total ventricular ectopic burden <1%. Total supraventricular ectopic burden <1%. Patient triggered events: 9.  Underlying rhythm normal sinus with the exception of one event noted NSR with transition to SVT underlying rhythm difficult to determine due to baseline artifact.   LABORATORY DATA: CBC Latest Ref Rng & Units 04/14/2021 12/10/2020 09/23/2020  WBC 3.4 - 10.8 x10E3/uL 7.1 5.2 9.1  Hemoglobin 11.1 - 15.9 g/dL 15.5 15.9(H) 17.2(H)  Hematocrit 34.0 - 46.6 % 44.7 45.5 48.6(H)  Platelets 150 - 450 x10E3/uL 279 209 316    CMP Latest Ref Rng & Units 04/14/2021 12/10/2020 09/23/2020  Glucose 65 - 99 mg/dL 106(H) 111(H) 98  BUN 8 - 27 mg/dL 14 15 13   Creatinine 0.57 - 1.00 mg/dL 0.84 0.69 0.60  Sodium 134 - 144 mmol/L 142 134(L) 141  Potassium 3.5 - 5.2 mmol/L 4.6 4.0 3.9  Chloride 96 - 106 mmol/L 100 98 101  CO2 20 - 29 mmol/L 27 27 28   Calcium 8.7 - 10.3 mg/dL 9.7 9.2 9.6  Total Protein 6.0 - 8.5 g/dL 7.2 7.0 -  Total Bilirubin 0.0 - 1.2 mg/dL 0.5 1.0 -  Alkaline Phos 44 - 121 IU/L 85 63 -  AST 0 - 40 IU/L 30 30 -  ALT 0 - 32 IU/L 30 22 -    Lab Results  Component Value Date   CHOL 202 (H) 04/14/2021   HDL 77 04/14/2021   LDLCALC 98 04/14/2021   TRIG 160 (H) 04/14/2021   CHOLHDL 2.6 04/14/2021    No components found for: NTPROBNP Recent Labs    09/23/20 1347  PROBNP 47   Recent Labs    06/23/20 0232 04/14/21 1155  TSH 1.610 1.060    BMP Recent Labs    07/10/20 1327 07/11/20 2128 09/23/20 1349 12/10/20 1011 04/14/21 1155  NA 137 137 141 134* 142  K 4.0 3.5 3.9 4.0 4.6  CL 102 103 101 98 100  CO2 25 23 28 27 27   GLUCOSE 108* 95 98 111* 106*  BUN 17 16 13 15 14   CREATININE 0.57 0.61 0.60 0.69 0.84  CALCIUM 8.6* 9.0 9.6 9.2 9.7  GFRNONAA >60 >60 94 >60  --   GFRAA >60 >60 108  --   --     HEMOGLOBIN A1C No results found for: HGBA1C,  MPG  IMPRESSION:    ICD-10-CM   1. 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 Essential hypertension  I10   3. Mixed hyperlipidemia  E78.2 pravastatin (PRAVACHOL) 40 MG tablet    ezetimibe (ZETIA) 10 MG tablet  4. Former smoker  Z87.891   5. Stenosis of right carotid artery  I65.21 pravastatin (PRAVACHOL) 40 MG tablet    ezetimibe (ZETIA) 10 MG tablet    Lipid Panel With LDL/HDL Ratio    LDL cholesterol, direct    CMP14+EGFR     RECOMMENDATIONS: Jody Wilson is a 69 y.o. female whose past medical history and cardiac risk factors include:  hypertension, hyperlipidemia, GERD, active smoker, Thoracic aortic atherosclerosis, coronary artery calcification, advanced age, postmenopausal female.  Coronary atherosclerosis due to calcified coronary lesions of the native artery without angina pectoris:  Continue aspirin and statin therapy.  Denies anginal discomfort  Has undergone an extensive ischemic evaluation as noted above.  Continue to monitor  Syncope:  No recurrence since July 2021.  Significantly improved after the cessation of alcohol.  Continue to monitor.  Dyspnea on exertion: Improving.  Has undergone ischemic evaluation as outlined above.  I suspect that she may have underlying COPD or other pulmonary conditions that are nondiagnostic given the degree of smoking.  May benefit from pulmonary evaluation will defer to primary team.  Patient is agreeable with the plan of care.  Since that she has stopped smoking she states that the symptoms are improving.   Bilateral carotid artery stenosis:  Asymptomatic  Discovered incidentally during a work-up of syncope.   Repeat duplex dated 04/30/2021 notes progression of right ICA stenosis to 50-69%.  We will uptitrate pravastatin to 40 mg p.o. nightly  Repeat blood work in 6 weeks  to evaluate fasting lipid profile and CMP.  Scheduled for repeat carotid duplex in November 2022.  Patient is made aware of being cognizant for strokelike symptoms and if present to seek medical attention by going to the closest ER via EMS.  She verbalized understanding.  Former smoker:  Patient has been smoke-free since 05/16/2021.  Patient is congratulated on her efforts.  Educated on the importance of continued complete cessation of nicotine products.  FINAL MEDICATION LIST END OF ENCOUNTER: Meds ordered this encounter  Medications  .  pravastatin (PRAVACHOL) 40 MG tablet    Sig: Take 1 tablet (40 mg total) by mouth every evening.    Dispense:  180 tablet    Refill:  0  . ezetimibe (ZETIA) 10 MG tablet    Sig: Take 1 tablet (10 mg total) by mouth daily.    Dispense:  90 tablet    Refill:  1    Medications Discontinued During This Encounter  Medication Reason  . denosumab (PROLIA) 60 MG/ML SOSY injection Error  . levETIRAcetam (KEPPRA) 250 MG tablet Error  . sertraline (ZOLOFT) 100 MG tablet Error  . albuterol (VENTOLIN HFA) 108 (90 Base) MCG/ACT inhaler Error  . pravastatin (PRAVACHOL) 20 MG tablet Dose change     Current Outpatient Medications:  .  ALPRAZolam (XANAX) 0.5 MG tablet, Take 1 tablet (0.5 mg total) by mouth 2 (two) times daily as needed for anxiety., Disp: 60 tablet, Rfl: 2 .  aspirin EC 81 MG tablet, Take 1 tablet (81 mg total) by mouth daily. Swallow whole., Disp: 90 tablet, Rfl: 3 .  calcium-vitamin D (OSCAL WITH D) 500-200 MG-UNIT tablet, Take 1 tablet by mouth daily with lunch. , Disp: , Rfl:  .  clobetasol cream (TEMOVATE) 0.86 %, 1 application, Disp: , Rfl:  .  cyanocobalamin (,VITAMIN B-12,) 1000 MCG/ML injection, Inject 1,000 mcg into the muscle every 28 (twenty-eight) days. , Disp: , Rfl:  .  ezetimibe (ZETIA) 10 MG tablet, Take 1 tablet (10 mg total) by mouth daily., Disp: 90 tablet, Rfl: 1 .  folic acid (FOLVITE) 1 MG tablet, Take 1 mg by mouth  daily., Disp: , Rfl:  .  hydrochlorothiazide (MICROZIDE) 12.5 MG capsule, Take 1 capsule (12.5 mg total) by mouth daily., Disp: 90 capsule, Rfl: 3 .  Multiple Vitamins-Minerals (WOMENS 50+ MULTI VITAMIN/MIN PO), Take 1 tablet by mouth every Monday, Tuesday, Wednesday, Thursday, and Friday. , Disp: , Rfl:  .  mupirocin ointment (BACTROBAN) 2 %, Apply 1 application topically 3 (three) times daily., Disp: , Rfl:  .  olmesartan (BENICAR) 40 MG tablet, Take 1 tablet (40 mg total) by mouth daily., Disp: 90 tablet, Rfl: 1 .  pantoprazole (PROTONIX) 40 MG tablet, TAKE 1 TABLET DAILY, Disp: 90 tablet, Rfl: 2 .  pravastatin (PRAVACHOL) 40 MG tablet, Take 1 tablet (40 mg total) by mouth every evening., Disp: 180 tablet, Rfl: 0 .  tiZANidine (ZANAFLEX) 4 MG tablet, 1 tablet as needed, Disp: , Rfl:  .  tolterodine (DETROL LA) 4 MG 24 hr capsule, Take 1 capsule (4 mg total) by mouth daily., Disp: 30 capsule, Rfl: 5 .  valACYclovir (VALTREX) 1000 MG tablet, Take 2 tablets (2,000 mg total) by mouth 2 (two) times daily. For 2 days (Patient taking differently: Take 1,000 mg by mouth 3 (three) times daily.), Disp: 8 tablet, Rfl: 10 .  bifidobacterium infantis (ALIGN) capsule, See admin instructions., Disp: , Rfl:   Current Facility-Administered Medications:  .  denosumab (PROLIA) injection 60 mg, 60 mg, Subcutaneous, Q6 months, Jody Wilson, Jody Kaufmann, MD, 60 mg at 01/08/21 1124  Orders Placed This Encounter  Procedures  . Lipid Panel With LDL/HDL Ratio  . LDL cholesterol, direct  . CMP14+EGFR  . EKG 12-Lead   There are no Patient Instructions on file for this visit.   --Continue cardiac medications as reconciled in final medication list. We have gone to stop smoking and landing --Return in about 6 months (around 11/18/2021) for Follow up carotid stenosis, review carotid duplex results. Or sooner if needed. --Continue follow-up  with your primary care physician regarding the management of your other chronic  comorbid conditions.  Patient's questions and concerns were addressed to her satisfaction. She voices understanding of the instructions provided during this encounter.   This note was created using a voice recognition software as a result there may be grammatical errors inadvertently enclosed that do not reflect the nature of this encounter. Every attempt is made to correct such errors.  Rex Kras, [place], [facility]  Pager: [phone] Office: [phone]

## 2021-05-27 ENCOUNTER — Other Ambulatory Visit: Payer: Self-pay | Admitting: Cardiology

## 2021-05-27 DIAGNOSIS — I2510 Atherosclerotic heart disease of native coronary artery without angina pectoris: Secondary | ICD-10-CM

## 2021-05-27 DIAGNOSIS — I6521 Occlusion and stenosis of right carotid artery: Secondary | ICD-10-CM

## 2021-05-27 DIAGNOSIS — I2584 Coronary atherosclerosis due to calcified coronary lesion: Secondary | ICD-10-CM

## 2021-06-04 DIAGNOSIS — S92592A Other fracture of left lesser toe(s), initial encounter for closed fracture: Secondary | ICD-10-CM | POA: Diagnosis not present

## 2021-06-04 DIAGNOSIS — M79673 Pain in unspecified foot: Secondary | ICD-10-CM | POA: Diagnosis not present

## 2021-06-09 ENCOUNTER — Telehealth (HOSPITAL_COMMUNITY): Payer: Self-pay | Admitting: Surgery

## 2021-06-09 ENCOUNTER — Other Ambulatory Visit (HOSPITAL_COMMUNITY): Payer: Self-pay | Admitting: Surgery

## 2021-06-09 DIAGNOSIS — E538 Deficiency of other specified B group vitamins: Secondary | ICD-10-CM

## 2021-06-09 DIAGNOSIS — Z122 Encounter for screening for malignant neoplasm of respiratory organs: Secondary | ICD-10-CM

## 2021-06-09 DIAGNOSIS — D509 Iron deficiency anemia, unspecified: Secondary | ICD-10-CM

## 2021-06-09 NOTE — Telephone Encounter (Signed)
Pt had left a voicemail requesting her labs to drawn at [facility] tomorrow.   The orders were put in under [facility] based on Faythe Casa' last note.    I called the pt and she verbalized understanding that she is to call [facility] and let them know the orders were put in under [facility], and to fax [place] back the results to [phone].

## 2021-06-10 ENCOUNTER — Inpatient Hospital Stay (HOSPITAL_COMMUNITY): Payer: Medicare Other

## 2021-06-10 ENCOUNTER — Other Ambulatory Visit: Payer: Self-pay

## 2021-06-10 ENCOUNTER — Other Ambulatory Visit: Payer: Medicare Other

## 2021-06-10 DIAGNOSIS — E538 Deficiency of other specified B group vitamins: Secondary | ICD-10-CM | POA: Diagnosis not present

## 2021-06-10 DIAGNOSIS — Z122 Encounter for screening for malignant neoplasm of respiratory organs: Secondary | ICD-10-CM

## 2021-06-10 DIAGNOSIS — D509 Iron deficiency anemia, unspecified: Secondary | ICD-10-CM | POA: Diagnosis not present

## 2021-06-11 LAB — FERRITIN: Ferritin: 167 ng/mL — ABNORMAL HIGH (ref 15–150)

## 2021-06-11 LAB — COMPREHENSIVE METABOLIC PANEL
ALT: 20 IU/L (ref 0–32)
AST: 37 IU/L (ref 0–40)
Albumin/Globulin Ratio: 1.6 (ref 1.2–2.2)
Albumin: 4.4 g/dL (ref 3.8–4.8)
Alkaline Phosphatase: 92 IU/L (ref 44–121)
BUN/Creatinine Ratio: 19 (ref 12–28)
BUN: 13 mg/dL (ref 8–27)
Bilirubin Total: 0.5 mg/dL (ref 0.0–1.2)
CO2: 27 mmol/L (ref 20–29)
Calcium: 9.7 mg/dL (ref 8.7–10.3)
Chloride: 98 mmol/L (ref 96–106)
Creatinine, Ser: 0.7 mg/dL (ref 0.57–1.00)
Globulin, Total: 2.7 g/dL (ref 1.5–4.5)
Glucose: 104 mg/dL — ABNORMAL HIGH (ref 65–99)
Potassium: 3.7 mmol/L (ref 3.5–5.2)
Sodium: 141 mmol/L (ref 134–144)
Total Protein: 7.1 g/dL (ref 6.0–8.5)
eGFR: 94 mL/min/{1.73_m2} (ref >59)

## 2021-06-11 LAB — VITAMIN B12: Vitamin B-12: 1240 pg/mL (ref 232–1245)

## 2021-06-11 LAB — CBC WITH DIFFERENTIAL/PLATELET
Basophils Absolute: 0 10*3/uL (ref 0.0–0.2)
Basos: 0 %
EOS (ABSOLUTE): 0.2 10*3/uL (ref 0.0–0.4)
Eos: 2 %
Hematocrit: 47.4 % — ABNORMAL HIGH (ref 34.0–46.6)
Hemoglobin: 16.3 g/dL — ABNORMAL HIGH (ref 11.1–15.9)
Immature Grans (Abs): 0 10*3/uL (ref 0.0–0.1)
Immature Granulocytes: 0 %
Lymphocytes Absolute: 2.4 10*3/uL (ref 0.7–3.1)
Lymphs: 30 %
MCH: 35.6 pg — ABNORMAL HIGH (ref 26.6–33.0)
MCHC: 34.4 g/dL (ref 31.5–35.7)
MCV: 104 fL — ABNORMAL HIGH (ref 79–97)
Monocytes Absolute: 0.6 10*3/uL (ref 0.1–0.9)
Monocytes: 8 %
Neutrophils Absolute: 4.6 10*3/uL (ref 1.4–7.0)
Neutrophils: 60 %
Platelets: 284 10*3/uL (ref 150–450)
RBC: 4.58 x10E6/uL (ref 3.77–5.28)
RDW: 12 % (ref 11.7–15.4)
WBC: 7.8 10*3/uL (ref 3.4–10.8)

## 2021-06-11 LAB — IRON AND TIBC
Iron Saturation: 47 % (ref 15–55)
Iron: 133 ug/dL (ref 27–139)
Total Iron Binding Capacity: 285 ug/dL (ref 250–450)
UIBC: 152 ug/dL (ref 118–369)

## 2021-06-14 NOTE — Progress Notes (Deleted)
VISIT CANCELLED

## 2021-06-15 ENCOUNTER — Ambulatory Visit (HOSPITAL_COMMUNITY): Payer: Medicare Other | Admitting: Physician Assistant

## 2021-06-17 ENCOUNTER — Ambulatory Visit (HOSPITAL_COMMUNITY): Payer: Medicare Other | Admitting: Hematology and Oncology

## 2021-07-03 NOTE — Progress Notes (Signed)
[place], [place] [address]   CLINIC:  Medical Oncology/Hematology  PCP:  Dettinger, Jody Kaufmann, MD [place] [id number] [phone]   REASON FOR VISIT:  Follow-up for normocytic anemia  CURRENT THERAPY: Intermittent IV iron; daily V76 and folic acid supplement  INTERVAL HISTORY:  Jody Wilson 70 y.o. female returns for routine follow-up of her normocytic anemia.  She was last seen by NP Jody Wilson on 12/18/2020.  At today's visit, she reports that she is doing fairly well.  She denies any recent hospitalization or changes in her baseline health status since her last visit.  She admits to chronic fatigue (energy 60%).  She denies any current signs or symptoms of blood loss such as epistaxis, hematemesis, hemoptysis, hematochezia, or melena.  Her Hgb/HCT were actually noted to be elevated at this visit.  She does admit to some mild erythromelalgia described as intermittent stinging and burning on the bottom of her feet that "feels like bee stings" and lasts about 60 seconds before resolving.  She has occasional brief tinnitus and dizziness.  Denies aquagenic pruritus, headaches, peripheral neuropathy, and other vasomotor symptoms.  No history of blood clots.  Patient continues to smoke cigarettes, but is cut back to 0.5 PPD.  She continues to take B12 shots monthly at home.  No oral iron supplementation.  Patient states that she wants to discontinue her Prolia.  She reports that she experiences bone pain for several weeks after each injection and would rather "try her luck breaking a bone than keep taking it."  She has 60% energy and 70% appetite. She endorses that she is maintaining a stable weight.    REVIEW OF SYSTEMS:  Review of Systems  Constitutional:  Positive for fatigue. Negative for appetite change, chills, diaphoresis, fever and unexpected weight change.  HENT:   Negative for lump/mass and nosebleeds.   Eyes:   Negative for eye problems.  Respiratory:  Positive for cough (COPD) and shortness of breath (COPD). Negative for hemoptysis.        Respiratory symptoms stable at baseline  Cardiovascular:  Negative for chest pain, leg swelling and palpitations.  Gastrointestinal:  Negative for abdominal pain, blood in stool, constipation, diarrhea, nausea and vomiting.  Genitourinary:  Negative for hematuria.   Skin: Negative.   Neurological:  Positive for dizziness. Negative for headaches and light-headedness.  Hematological:  Does not bruise/bleed easily.     PAST MEDICAL/SURGICAL HISTORY:  Past Medical History:  Diagnosis Date   Anemia    Anxiety    Aortic atherosclerosis (HCC)    Depression    GERD (gastroesophageal reflux disease)    Hyperlipidemia    Hypertension    Orthostatic hypotension    Osteoporosis    Past Surgical History:  Procedure Laterality Date   ABDOMINAL HYSTERECTOMY     BACK SURGERY     CHOLECYSTECTOMY     FOOT SURGERY     7 in the past   IR GENERIC HISTORICAL  12/09/2016   IR RADIOLOGIST EVAL & MGMT 12/09/2016 [facility]   IR GENERIC HISTORICAL  12/10/2016   IR VERTEBROPLASTY CERV/THOR BX INC UNI/BIL INC/INJECT/IMAGING 12/10/2016 [facility]   LEFT HEART CATH AND CORONARY ANGIOGRAPHY N/A 09/30/2020   Procedure: LEFT HEART CATH AND CORONARY ANGIOGRAPHY;  Surgeon: Jody Mormon, MD;  Location: 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  Tobacco Use   Smoking status: Every Day    Packs/day: 0.25    Types: Cigarettes    Smokeless tobacco: Never   Tobacco comments:    Trying to cut on number of cigarettes she smokes.  Vaping Use   Vaping Use: Never used  Substance and Sexual Activity   Alcohol use: Not Currently    Comment: occassionally    Drug use: No   Sexual activity: Not on file  Other Topics Concern   Not on file  Social History Narrative   Lives with her husband, Jody Wilson, and has two grown sons and two grand sons.   She visits family and has a once weekly outing with a friend for socializing.  She enjoys making beaded jewelry, reading and walking..   Social Determinants of Health   Financial Resource Strain: Not on file  Food Insecurity: Not on file  Transportation Needs: No Transportation Needs   Lack of Transportation (Medical): No   Lack of Transportation (Non-Medical): No  Physical Activity: Insufficiently Active   Days of Exercise per Week: 2 days   Minutes of Exercise per Session: 20 min  Stress: Not on file  Social Connections: Not on file  Intimate Partner Violence: Not At Risk   Fear of Current or Ex-Partner: No   Emotionally Abused: No   Physically Abused: No   Sexually Abused: No    FAMILY HISTORY:  Family History  Problem Relation Age of Onset   Heart disease Mother    Hypertension Mother    Hyperlipidemia Mother    Diabetes Mother    Congestive Heart Failure Mother    Hypertension Son    Other Son 36       Kienbock's Disease   Liver disease Son 3       Gilbert's Disease   Aneurysm Paternal Uncle    Heart attack Maternal Grandmother    Heart attack Maternal Grandfather    Lupus Half-Sister    Atrial fibrillation Half-Sister     CURRENT MEDICATIONS:  Outpatient Encounter Medications as of 07/06/2021  Medication Sig Note   ALPRAZolam (XANAX) 0.5 MG tablet Take 1 tablet (0.5 mg total) by mouth 2 (two) times daily as needed for anxiety.    aspirin EC 81 MG tablet Take 1 tablet (81 mg total) by mouth daily. Swallow whole.    bifidobacterium infantis (ALIGN)  capsule See admin instructions.    calcium-vitamin D (OSCAL WITH D) 500-200 MG-UNIT tablet Take 1 tablet by mouth daily with lunch.     clobetasol cream (TEMOVATE) 4.13 % 1 application    cyanocobalamin (,VITAMIN B-12,) 1000 MCG/ML injection Inject 1,000 mcg into the muscle every 28 (twenty-eight) days.  09/19/2020: Next dose due:09/24/2020   ezetimibe (ZETIA) 10 MG tablet Take 1 tablet (10 mg total) by mouth daily.    folic acid (FOLVITE) 1 MG tablet Take 1 mg by mouth daily.    hydrochlorothiazide (MICROZIDE) 12.5 MG capsule Take 1 capsule (12.5 mg total) by mouth daily.    Multiple Vitamins-Minerals (WOMENS 50+ MULTI VITAMIN/MIN PO) Take 1 tablet by mouth every Monday, Tuesday, Wednesday, Thursday, and Friday.     mupirocin  ointment (BACTROBAN) 2 % Apply 1 application topically 3 (three) times daily.    olmesartan (BENICAR) 40 MG tablet Take 1 tablet (40 mg total) by mouth daily.    pantoprazole (PROTONIX) 40 MG tablet TAKE 1 TABLET DAILY    pravastatin (PRAVACHOL) 40 MG tablet Take 1 tablet (40 mg total) by mouth every evening.    tiZANidine (ZANAFLEX) 4 MG tablet 1 tablet as needed    tolterodine (DETROL LA) 4 MG 24 hr capsule Take 1 capsule (4 mg total) by mouth daily.    valACYclovir (VALTREX) 1000 MG tablet Take 2 tablets (2,000 mg total) by mouth 2 (two) times daily. For 2 days (Patient taking differently: Take 1,000 mg by mouth 3 (three) times daily.)    Facility-Administered Encounter Medications as of 07/06/2021  Medication Note   denosumab (PROLIA) injection 60 mg 07/15/2020: Last injection of Prolia (60 mg) taken 06-13-2020.     ALLERGIES:  Allergies  Allergen Reactions   Tetracyclines & Related Itching and Swelling     PHYSICAL EXAM:  ECOG PERFORMANCE STATUS: 1 - Symptomatic but completely ambulatory  There were no vitals filed for this visit. There were no vitals filed for this visit. Physical Exam Constitutional:      Appearance: Normal appearance.  HENT:     Head:  Normocephalic and atraumatic.     Mouth/Throat:     Mouth: Mucous membranes are moist.  Eyes:     Extraocular Movements: Extraocular movements intact.     Pupils: Pupils are equal, round, and reactive to light.  Cardiovascular:     Rate and Rhythm: Normal rate and regular rhythm.     Pulses: Normal pulses.     Heart sounds: Normal heart sounds.  Pulmonary:     Effort: Pulmonary effort is normal.     Breath sounds: Normal breath sounds.  Abdominal:     General: Bowel sounds are normal.     Palpations: Abdomen is soft.     Tenderness: There is no abdominal tenderness.  Musculoskeletal:        General: No swelling.     Right lower leg: No edema.     Left lower leg: No edema.  Lymphadenopathy:     Cervical: No cervical adenopathy.  Skin:    General: Skin is warm and dry.  Neurological:     General: No focal deficit present.     Mental Status: She is alert and oriented to person, place, and time.  Psychiatric:        Mood and Affect: Mood normal.        Behavior: Behavior normal.     LABORATORY DATA:  I have reviewed the labs as listed.  CBC    Component Value Date/Time   WBC 7.8 06/10/2021 1339   WBC 5.2 12/10/2020 1011   RBC 4.58 06/10/2021 1339   RBC 4.42 12/10/2020 1011   HGB 16.3 (H) 06/10/2021 1339   HCT 47.4 (H) 06/10/2021 1339   PLT 284 06/10/2021 1339   MCV 104 (H) 06/10/2021 1339   MCH 35.6 (H) 06/10/2021 1339   MCH 36.0 (H) 12/10/2020 1011   MCHC 34.4 06/10/2021 1339   MCHC 34.9 12/10/2020 1011   RDW 12.0 06/10/2021 1339   LYMPHSABS 2.4 06/10/2021 1339   MONOABS 0.5 12/10/2020 1011   EOSABS 0.2 06/10/2021 1339   BASOSABS 0.0 06/10/2021 1339   CMP Latest Ref Rng & Units 06/10/2021 04/14/2021 12/10/2020  Glucose 65 - 99 mg/dL 104(H) 106(H) 111(H)  BUN 8 - 27  mg/dL 13 14 15   Creatinine 0.57 - 1.00 mg/dL 0.70 0.84 0.69  Sodium 134 - 144 mmol/L 141 142 134(L)  Potassium 3.5 - 5.2 mmol/L 3.7 4.6 4.0  Chloride 96 - 106 mmol/L 98 100 98  CO2 20 - 29 mmol/L  27 27 27   Calcium 8.7 - 10.3 mg/dL 9.7 9.7 9.2  Total Protein 6.0 - 8.5 g/dL 7.1 7.2 7.0  Total Bilirubin 0.0 - 1.2 mg/dL 0.5 0.5 1.0  Alkaline Phos 44 - 121 IU/L 92 85 63  AST 0 - 40 IU/L 37 30 30  ALT 0 - 32 IU/L 20 30 22     DIAGNOSTIC IMAGING:  I have independently reviewed the relevant imaging and discussed with the patient.  ASSESSMENT & PLAN: 1.  History of normocytic anemia, RESOLVED - Last colonoscopy (July 2021) was unremarkable - She reports recent EGD and colonoscopy via Dr. Paulita Fujita [facility] physicians in [place]), unable to find report in epic - She last received Feraheme infusion on 03/17/2020. - Denies any bleeding per rectum or melena. - She is not taking any oral iron suuplement - Most recent labs (06/10/2021): Hgb 16.3, HCT 47.4; ferritin 167, iron saturation 47% - PLAN: No IV iron or intervention for anemia needed at this time.    2.  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  Will call patient with any abnormal results.  RTC in 3 months for repeat CBC.  3.  Vitamin B12 deficiency: - Most recent B12 level (06/10/2021): 1240 - PLAN: Continue B12 injections once a month, patient self administers at home.  Check B12 and methylmalonic acid in 6 months.  4.  Folic acid deficiency: - Most recent folate (04/02/2020): 44.9 - PLAN: Continue folic acid 1 mg daily.  Check folate in 6 months.   5.  Osteoporosis: -DEXA scan on 01/31/2017 showed T score of -2.9. - Patient has refused  continuation of Prolia injections due to bone pain experienced after treatment - PLAN: Encouraged patient to follow-up with PCP for repeat DEXA scan and other treatment options  6.  Tobacco abuse: - She smoked 1 pack/day for 40 years. - Current smoking status: 0.5 PPD, trying to quit - CT lung cancer screening scan on 05/19/2021 was lung RADS 2. - PLAN: We will plan to repeat another LDCT scan in May 2023.    PLAN SUMMARY & DISPOSITION: - Labs today for investigation of polycythemia (at [facility] per patient request, erythropoietin and JAK2 with reflex) - Repeat CBC and nutritional panel with RTC in 3 months (phone visit)   All questions were answered. The patient knows to call the clinic with any problems, questions or concerns.  Medical decision making: Moderate (2+ chronic problems, 1 new problem under work-up)  Time spent on visit: I spent 20 minutes counseling the patient face to face. The total time spent in the appointment was 30 minutes and more than 50% was on counseling.   Harriett Rush, PA-C  07/06/2021 3:47 PM

## 2021-07-06 ENCOUNTER — Other Ambulatory Visit: Payer: Self-pay

## 2021-07-06 ENCOUNTER — Inpatient Hospital Stay (HOSPITAL_COMMUNITY): Payer: Medicare Other | Attending: Physician Assistant | Admitting: Physician Assistant

## 2021-07-06 VITALS — BP 133/80 | HR 84 | Temp 97.0°F | Resp 18 | Wt 147.5 lb

## 2021-07-06 DIAGNOSIS — E538 Deficiency of other specified B group vitamins: Secondary | ICD-10-CM | POA: Diagnosis not present

## 2021-07-06 DIAGNOSIS — Z833 Family history of diabetes mellitus: Secondary | ICD-10-CM | POA: Diagnosis not present

## 2021-07-06 DIAGNOSIS — D509 Iron deficiency anemia, unspecified: Secondary | ICD-10-CM

## 2021-07-06 DIAGNOSIS — I7381 Erythromelalgia: Secondary | ICD-10-CM | POA: Insufficient documentation

## 2021-07-06 DIAGNOSIS — I10 Essential (primary) hypertension: Secondary | ICD-10-CM | POA: Diagnosis not present

## 2021-07-06 DIAGNOSIS — D751 Secondary polycythemia: Secondary | ICD-10-CM | POA: Diagnosis not present

## 2021-07-06 DIAGNOSIS — Z8379 Family history of other diseases of the digestive system: Secondary | ICD-10-CM | POA: Insufficient documentation

## 2021-07-06 DIAGNOSIS — D649 Anemia, unspecified: Secondary | ICD-10-CM | POA: Insufficient documentation

## 2021-07-06 DIAGNOSIS — Z9049 Acquired absence of other specified parts of digestive tract: Secondary | ICD-10-CM | POA: Diagnosis not present

## 2021-07-06 DIAGNOSIS — R42 Dizziness and giddiness: Secondary | ICD-10-CM | POA: Insufficient documentation

## 2021-07-06 DIAGNOSIS — Z8349 Family history of other endocrine, nutritional and metabolic diseases: Secondary | ICD-10-CM | POA: Diagnosis not present

## 2021-07-06 DIAGNOSIS — M810 Age-related osteoporosis without current pathological fracture: Secondary | ICD-10-CM | POA: Insufficient documentation

## 2021-07-06 DIAGNOSIS — F419 Anxiety disorder, unspecified: Secondary | ICD-10-CM | POA: Insufficient documentation

## 2021-07-06 DIAGNOSIS — Z832 Family history of diseases of the blood and blood-forming organs and certain disorders involving the immune mechanism: Secondary | ICD-10-CM | POA: Insufficient documentation

## 2021-07-06 DIAGNOSIS — R5382 Chronic fatigue, unspecified: Secondary | ICD-10-CM | POA: Insufficient documentation

## 2021-07-06 DIAGNOSIS — Z8249 Family history of ischemic heart disease and other diseases of the circulatory system: Secondary | ICD-10-CM | POA: Insufficient documentation

## 2021-07-06 DIAGNOSIS — E785 Hyperlipidemia, unspecified: Secondary | ICD-10-CM | POA: Insufficient documentation

## 2021-07-06 DIAGNOSIS — Z79899 Other long term (current) drug therapy: Secondary | ICD-10-CM | POA: Insufficient documentation

## 2021-07-06 DIAGNOSIS — K219 Gastro-esophageal reflux disease without esophagitis: Secondary | ICD-10-CM | POA: Insufficient documentation

## 2021-07-06 DIAGNOSIS — J449 Chronic obstructive pulmonary disease, unspecified: Secondary | ICD-10-CM | POA: Diagnosis not present

## 2021-07-06 DIAGNOSIS — F32A Depression, unspecified: Secondary | ICD-10-CM | POA: Diagnosis not present

## 2021-07-06 DIAGNOSIS — F17210 Nicotine dependence, cigarettes, uncomplicated: Secondary | ICD-10-CM | POA: Diagnosis not present

## 2021-07-06 MED ORDER — CYANOCOBALAMIN 1000 MCG/ML IJ SOLN: 1000.0000 ug | INTRAMUSCULAR | 6 refills | Status: DC

## 2021-07-06 NOTE — Patient Instructions (Signed)
[place] at [facility] Discharge Instructions  You were seen today by Tarri Abernethy PA-C for your history of anemia.  You are NOT anemic at this visit.  Your blood counts are actually higher than normal.  As we discussed, this is most likely due to your smoking and your possible underlying COPD.  However, we will check some labs at your [facility] visit tomorrow to check for genetic mutations that can also cause elevated red blood cell counts.  If there is anything abnormal, we will call you and explain the results to you.  Otherwise, we will recheck your normal labs and see you for follow-up visit in 3 months.  LABS: Return in 3 months for repeat labs  OTHER TESTS: Genetic mutation testing with your [facility] visit tomorrow (JAK2 with reflex to CALR/MPL)  MEDICATIONS: Continue aspirin, B12 injection, and folic acid.  Continue your other previously prescribed home medications.  FOLLOW-UP APPOINTMENT: Phone visit in 3 months after you have gotten your labs   Thank you for choosing [place] at [facility] to provide your oncology and hematology care.  To afford each patient quality time with our provider, please arrive at least 15 minutes before your scheduled appointment time.   If you have a lab appointment with the [facility] please come in thru the Main Entrance and check in at the main information desk.  You need to re-schedule your appointment should you arrive 10 or more minutes late.  We strive to give you quality time with our providers, and arriving late affects you and other patients whose appointments are after yours.  Also, if you no show three or more times for appointments you may be dismissed from the clinic at the providers discretion.     Again, thank you for choosing [facility].  Our hope is that these requests will decrease the amount of time that you wait before being seen by our physicians.        _____________________________________________________________  Should you have questions after your visit to [facility], please contact our office at [phone] and follow the prompts.  Our office hours are 8:00 a.m. and 4:30 p.m. Monday - Friday.  Please note that voicemails left after 4:00 p.m. may not be returned until the following business day.  We are closed weekends and major holidays.  You do have access to a nurse 24-7, just call the main number to the clinic [phone] and do not press any options, hold on the line and a nurse will answer the phone.    For prescription refill requests, have your pharmacy contact our office and allow 72 hours.    Due to Covid, you will need to wear a mask upon entering the hospital. If you do not have a mask, a mask will be given to you at the Main Entrance upon arrival. For doctor visits, patients may have 1 support person age 47 or older with them. For treatment visits, patients can not have anyone with them due to social distancing guidelines and our immunocompromised population.

## 2021-07-07 ENCOUNTER — Other Ambulatory Visit: Payer: Medicare Other

## 2021-07-07 DIAGNOSIS — D509 Iron deficiency anemia, unspecified: Secondary | ICD-10-CM

## 2021-07-07 DIAGNOSIS — E538 Deficiency of other specified B group vitamins: Secondary | ICD-10-CM

## 2021-07-07 DIAGNOSIS — I6521 Occlusion and stenosis of right carotid artery: Secondary | ICD-10-CM | POA: Diagnosis not present

## 2021-07-07 DIAGNOSIS — I2584 Coronary atherosclerosis due to calcified coronary lesion: Secondary | ICD-10-CM | POA: Diagnosis not present

## 2021-07-07 DIAGNOSIS — D751 Secondary polycythemia: Secondary | ICD-10-CM | POA: Diagnosis not present

## 2021-07-07 DIAGNOSIS — M810 Age-related osteoporosis without current pathological fracture: Secondary | ICD-10-CM

## 2021-07-07 DIAGNOSIS — I2510 Atherosclerotic heart disease of native coronary artery without angina pectoris: Secondary | ICD-10-CM | POA: Diagnosis not present

## 2021-07-07 DIAGNOSIS — R7989 Other specified abnormal findings of blood chemistry: Secondary | ICD-10-CM

## 2021-07-08 LAB — CMP14+EGFR
ALT: 26 IU/L (ref 0–32)
AST: 54 IU/L — ABNORMAL HIGH (ref 0–40)
Albumin/Globulin Ratio: 1.7 (ref 1.2–2.2)
Albumin: 4.4 g/dL (ref 3.8–4.8)
Alkaline Phosphatase: 103 IU/L (ref 44–121)
BUN/Creatinine Ratio: 20 (ref 12–28)
BUN: 15 mg/dL (ref 8–27)
Bilirubin Total: 0.7 mg/dL (ref 0.0–1.2)
CO2: 24 mmol/L (ref 20–29)
Calcium: 9.8 mg/dL (ref 8.7–10.3)
Chloride: 101 mmol/L (ref 96–106)
Creatinine, Ser: 0.75 mg/dL (ref 0.57–1.00)
Globulin, Total: 2.6 g/dL (ref 1.5–4.5)
Glucose: 97 mg/dL (ref 65–99)
Potassium: 4 mmol/L (ref 3.5–5.2)
Sodium: 140 mmol/L (ref 134–144)
Total Protein: 7 g/dL (ref 6.0–8.5)
eGFR: 86 mL/min/{1.73_m2} (ref >59)

## 2021-07-08 LAB — LIPID PANEL WITH LDL/HDL RATIO
Cholesterol, Total: 192 mg/dL (ref 100–199)
HDL: 70 mg/dL (ref >39)
LDL Chol Calc (NIH): 89 mg/dL (ref 0–99)
LDL/HDL Ratio: 1.3 ratio (ref 0.0–3.2)
Triglycerides: 197 mg/dL — ABNORMAL HIGH (ref 0–149)
VLDL Cholesterol Cal: 33 mg/dL (ref 5–40)

## 2021-07-08 LAB — LDL CHOLESTEROL, DIRECT: LDL Direct: 83 mg/dL (ref 0–99)

## 2021-07-08 LAB — ERYTHROPOIETIN: Erythropoietin: 12.2 m[IU]/mL (ref 2.6–18.5)

## 2021-07-10 ENCOUNTER — Ambulatory Visit: Payer: Medicare Other | Admitting: Family Medicine

## 2021-07-11 ENCOUNTER — Other Ambulatory Visit: Payer: Self-pay | Admitting: Family Medicine

## 2021-07-15 ENCOUNTER — Other Ambulatory Visit: Payer: Self-pay

## 2021-07-15 ENCOUNTER — Ambulatory Visit
Admission: RE | Admit: 2021-07-15 | Discharge: 2021-07-15 | Disposition: A | Discharge status: Home / Self Care | Payer: Medicare Other | Source: Ambulatory Visit | Attending: Family Medicine | Admitting: Family Medicine

## 2021-07-15 ENCOUNTER — Telehealth: Payer: Self-pay | Admitting: Family Medicine

## 2021-07-15 DIAGNOSIS — Z1231 Encounter for screening mammogram for malignant neoplasm of breast: Secondary | ICD-10-CM | POA: Diagnosis not present

## 2021-07-16 NOTE — Progress Notes (Signed)
Patient is aware of results.

## 2021-07-20 NOTE — Telephone Encounter (Signed)
Ok, d/c the prolia and we will talk other options at her next visit

## 2021-07-20 NOTE — Telephone Encounter (Signed)
NA

## 2021-07-21 LAB — CALR+JAK2 E12-15+MPL

## 2021-07-21 LAB — JAK2 V617F, W REFLEX TO CALR/E12/MPL

## 2021-07-22 ENCOUNTER — Ambulatory Visit (INDEPENDENT_AMBULATORY_CARE_PROVIDER_SITE_OTHER): Payer: Medicare Other

## 2021-07-22 ENCOUNTER — Ambulatory Visit (INDEPENDENT_AMBULATORY_CARE_PROVIDER_SITE_OTHER): Payer: Medicare Other | Admitting: Family Medicine

## 2021-07-22 ENCOUNTER — Encounter: Payer: Self-pay | Admitting: Family Medicine

## 2021-07-22 ENCOUNTER — Other Ambulatory Visit: Payer: Self-pay

## 2021-07-22 ENCOUNTER — Other Ambulatory Visit: Payer: Self-pay | Admitting: Cardiology

## 2021-07-22 VITALS — BP 161/79 | HR 60 | Ht 64.5 in | Wt 150.0 lb

## 2021-07-22 DIAGNOSIS — I700 Atherosclerosis of aorta: Secondary | ICD-10-CM | POA: Diagnosis not present

## 2021-07-22 DIAGNOSIS — F419 Anxiety disorder, unspecified: Secondary | ICD-10-CM

## 2021-07-22 DIAGNOSIS — E781 Pure hyperglyceridemia: Secondary | ICD-10-CM | POA: Diagnosis not present

## 2021-07-22 DIAGNOSIS — Z780 Asymptomatic menopausal state: Secondary | ICD-10-CM

## 2021-07-22 DIAGNOSIS — I10 Essential (primary) hypertension: Secondary | ICD-10-CM | POA: Diagnosis not present

## 2021-07-22 DIAGNOSIS — E785 Hyperlipidemia, unspecified: Secondary | ICD-10-CM

## 2021-07-22 DIAGNOSIS — I6521 Occlusion and stenosis of right carotid artery: Secondary | ICD-10-CM

## 2021-07-22 DIAGNOSIS — I2584 Coronary atherosclerosis due to calcified coronary lesion: Secondary | ICD-10-CM | POA: Diagnosis not present

## 2021-07-22 DIAGNOSIS — I2510 Atherosclerotic heart disease of native coronary artery without angina pectoris: Secondary | ICD-10-CM | POA: Diagnosis not present

## 2021-07-22 DIAGNOSIS — Z79899 Other long term (current) drug therapy: Secondary | ICD-10-CM | POA: Diagnosis not present

## 2021-07-22 DIAGNOSIS — F32A Depression, unspecified: Secondary | ICD-10-CM | POA: Diagnosis not present

## 2021-07-22 DIAGNOSIS — M8000XD Age-related osteoporosis with current pathological fracture, unspecified site, subsequent encounter for fracture with routine healing: Secondary | ICD-10-CM

## 2021-07-22 MED ORDER — PANTOPRAZOLE SODIUM 40 MG PO TBEC: 40.0000 mg | DELAYED_RELEASE_TABLET | Freq: Every day | ORAL | 3 refills | Status: DC

## 2021-07-22 MED ORDER — ALPRAZOLAM 0.5 MG PO TABS: 0.5000 mg | ORAL_TABLET | Freq: Two times a day (BID) | ORAL | 2 refills | Status: DC | PRN

## 2021-07-22 MED ORDER — OLMESARTAN MEDOXOMIL 40 MG PO TABS: 40.0000 mg | ORAL_TABLET | Freq: Every day | ORAL | 3 refills | Status: DC

## 2021-07-22 MED ORDER — ICOSAPENT ETHYL 1 G PO CAPS: 2.0000 g | ORAL_CAPSULE | Freq: Two times a day (BID) | ORAL | 2 refills | Status: DC

## 2021-07-22 MED ORDER — CITALOPRAM HYDROBROMIDE 20 MG PO TABS: 20.0000 mg | ORAL_TABLET | Freq: Every day | ORAL | 3 refills | Status: DC

## 2021-07-22 NOTE — Progress Notes (Signed)
Lab Results  Component Value Date   CHOL 192 07/07/2021   HDL 70 07/07/2021   LDLCALC 89 07/07/2021   LDLDIRECT 83 07/07/2021   TRIG 197 (H) 07/07/2021   CHOLHDL 2.6 04/14/2021   Patient is informed of her most recent lipid profile.  Patient's triglyceride levels remain elevated and patient states that it is secondary to her carbohydrate intake and alcohol consumption.  Given the carotid artery stenosis, aortic atherosclerosis, and coronary artery atherosclerosis that shared decision was to start Vascepa.  Blood work in 6 weeks to evaluate fasting lipid profile and liver function test.  Patient is agreeable with the plan of care her questions and concerns addressed.  Medication profile discussed.    ICD-10-CM   1. Hypertriglyceridemia  E78.1 icosapent Ethyl (VASCEPA) 1 g capsule    Lipid Panel With LDL/HDL Ratio    LDL cholesterol, direct    CMP14+EGFR    2. 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 Stenosis of right carotid artery  I65.21 icosapent Ethyl (VASCEPA) 1 g capsule    4. Aortic atherosclerosis (HCC)  I70.0 icosapent Ethyl (VASCEPA) 1 g capsule     Meds ordered this encounter  Medications   icosapent Ethyl (VASCEPA) 1 g capsule    Sig: Take 2 capsules (2 g total) by mouth 2 (two) times daily.    Dispense:  120 capsule    Refill:  2    Orders Placed This Encounter  Procedures   Lipid Panel With LDL/HDL Ratio    Standing Status:   Future    Standing Expiration Date:   07/22/2022   LDL cholesterol, direct    Standing Status:   Future    Standing Expiration Date:   07/22/2022   CMP14+EGFR    Standing Status:   Future    Standing Expiration Date:   07/22/2022   Total telephone encounter 5 minutes.  Rex Kras, [place], [facility]  Pager: [phone] Office: [phone]

## 2021-07-22 NOTE — Progress Notes (Signed)
BP (!) 161/79   Pulse 60   Ht 5' 4.5" (1.638 m)   Wt 150 lb (68 kg)   SpO2 96%   BMI 25.35 kg/m    Subjective:   Patient ID: Jody Wilson, female    DOB: 28-Jan-1951, 70 y.o.   MRN: [id number]  HPI: Jody Wilson is a 70 y.o. female presenting on 07/22/2021 for Medical Management of Chronic Issues, Hyperlipidemia, Hypertension, Depression, and Anxiety   HPI Hypertension Patient is currently on hydrochlorothiazide and an olmesartan, and their blood pressure today is 161/79. Patient denies any lightheadedness or dizziness. Patient denies headaches, blurred vision, chest pains, shortness of breath, or weakness. Denies any side effects from medication and is content with current medication.   Hyperlipidemia Patient is coming in for recheck of his hyperlipidemia. The patient is currently taking pravastatin and Zetia. They deny any issues with myalgias or history of liver damage from it. They deny any focal numbness or weakness or chest pain.   Anxiety depression Patient is coming in for recheck for anxiety and depression.  She currently takes Celexa and then uses Xanax as needed.  She does need a refill for the Xanax.  She feels like the able to very well for her and she has been dealing with a lot of family recently including some losses and some health problems in her family members whom she feels like she is doing okay with that.  Osteoporosis/osteopenia Fractures or history of fracture: None Medication: Prolia but feels like she gets bone pain from it Duration of treatment: 8 months Last bone density scan: 8 months ago Last T score:    Relevant past medical, surgical, family and social history reviewed and updated as indicated. Interim medical history since our last visit reviewed. Allergies and medications reviewed and updated.  Review of Systems  Constitutional:  Negative for chills and fever.  Eyes:  Negative for visual disturbance.  Respiratory:  Negative for chest  tightness and shortness of breath.   Cardiovascular:  Negative for chest pain and leg swelling.  Musculoskeletal:  Positive for arthralgias. Negative for back pain and gait problem.  Skin:  Negative for rash.  Neurological:  Negative for light-headedness and headaches.  Psychiatric/Behavioral:  Negative for agitation and behavioral problems.   All other systems reviewed and are negative.  Per HPI unless specifically indicated above   Allergies as of 07/22/2021       Reactions   Tetracyclines & Related Itching, Swelling        Medication List        Accurate as of July 22, 2021  2:01 PM. If you have any questions, ask your nurse or doctor.          ALPRAZolam 0.5 MG tablet Commonly known as: XANAX Take 1 tablet (0.5 mg total) by mouth 2 (two) times daily as needed for anxiety.   aspirin EC 81 MG tablet Take 1 tablet (81 mg total) by mouth daily. Swallow whole.   bifidobacterium infantis capsule See admin instructions.   calcium-vitamin D 500-200 MG-UNIT tablet Commonly known as: OSCAL WITH D Take 1 tablet by mouth daily with lunch.   citalopram 20 MG tablet Commonly known as: CELEXA Take 1 tablet (20 mg total) by mouth daily.   clobetasol cream 0.05 % Commonly known as: TEMOVATE 1 application   cyanocobalamin 1000 MCG/ML injection Commonly known as: (VITAMIN B-12) Inject 1 mL (1,000 mcg total) into the muscle every 28 (twenty-eight) days.   ezetimibe 10 MG  tablet Commonly known as: ZETIA Take 1 tablet (10 mg total) by mouth daily.   folic acid 1 MG tablet Commonly known as: FOLVITE Take 1 mg by mouth daily.   hydrochlorothiazide 12.5 MG capsule Commonly known as: MICROZIDE Take 1 capsule (12.5 mg total) by mouth daily.   mupirocin ointment 2 % Commonly known as: BACTROBAN Apply 1 application topically 3 (three) times daily.   olmesartan 40 MG tablet Commonly known as: BENICAR Take 1 tablet (40 mg total) by mouth daily.   pantoprazole 40 MG  tablet Commonly known as: PROTONIX Take 1 tablet (40 mg total) by mouth daily.   pravastatin 40 MG tablet Commonly known as: PRAVACHOL Take 1 tablet (40 mg total) by mouth every evening.   tiZANidine 4 MG tablet Commonly known as: ZANAFLEX 1 tablet as needed   tolterodine 4 MG 24 hr capsule Commonly known as: DETROL LA Take 1 capsule (4 mg total) by mouth daily.   valACYclovir 1000 MG tablet Commonly known as: VALTREX Take 2 tablets (2,000 mg total) by mouth 2 (two) times daily. For 2 days What changed:  how much to take when to take this additional instructions   WOMENS 50+ MULTI VITAMIN/MIN PO Take 1 tablet by mouth every Monday, Tuesday, Wednesday, Thursday, and Friday.         Objective:   BP (!) 161/79   Pulse 60   Ht 5' 4.5" (1.638 m)   Wt 150 lb (68 kg)   SpO2 96%   BMI 25.35 kg/m   Wt Readings from Last 3 Encounters:  07/22/21 150 lb (68 kg)  07/06/21 147 lb 8 oz (66.9 kg)  05/25/21 149 lb 12.8 oz (67.9 kg)    Physical Exam Vitals and nursing note reviewed.  Constitutional:      General: She is not in acute distress.    Appearance: She is well-developed. She is not diaphoretic.  Eyes:     Conjunctiva/sclera: Conjunctivae normal.  Cardiovascular:     Rate and Rhythm: Normal rate and regular rhythm.     Heart sounds: Normal heart sounds. No murmur heard. Pulmonary:     Effort: Pulmonary effort is normal. No respiratory distress.     Breath sounds: Normal breath sounds. No wheezing.  Musculoskeletal:        General: No tenderness. Normal range of motion.  Skin:    General: Skin is warm and dry.     Findings: No rash.  Neurological:     Mental Status: She is alert and oriented to person, place, and time.     Coordination: Coordination normal.  Psychiatric:        Behavior: Behavior normal.      Assessment & Plan:   Problem List Items Addressed This Visit       Cardiovascular and Mediastinum   Hypertension - Primary   Relevant  Medications   olmesartan (BENICAR) 40 MG tablet     Musculoskeletal and Integument   Osteoporosis   Relevant Orders   AMB Referral to [place]     Other   Hyperlipidemia with target LDL less than 100   Relevant Medications   olmesartan (BENICAR) 40 MG tablet   Anxiety and depression   Relevant Medications   ALPRAZolam (XANAX) 0.5 MG tablet   citalopram (CELEXA) 20 MG tablet   Other Relevant Orders   ToxASSURE Select 13 (MW), Urine   Other Visit Diagnoses     Essential hypertension       Relevant Medications  olmesartan (BENICAR) 40 MG tablet       Continue current medication, no changes except that she is going to stop Prolia and go see Almyra Free for medication management Follow up plan: Return in about 3 months (around 10/22/2021), or if symptoms worsen or fail to improve, for Hypertension anxiety recheck with me in 3 months, please make appointment for [place] with osteoporos.  Counseling provided for all of the vaccine components Orders Placed This Encounter  Procedures   ToxASSURE Select 13 (MW), Urine   AMB Referral to [place] Florentina Marquart, MD [facility] 07/22/2021, 2:01 PM

## 2021-07-23 ENCOUNTER — Telehealth: Payer: Self-pay | Admitting: Cardiology

## 2021-07-23 ENCOUNTER — Telehealth: Payer: Self-pay

## 2021-07-23 ENCOUNTER — Ambulatory Visit: Payer: Medicare Other | Admitting: Neurology

## 2021-07-23 NOTE — Chronic Care Management (AMB) (Signed)
  Chronic Care Management   Note  07/23/2021 Name: Jody Wilson MRN: [id number] DOB: [birth year]  Jody Wilson is a 70 y.o. year old female who is a primary care patient of Dettinger, Fransisca Kaufmann, MD. I reached out to [name] by phone today in response to a referral sent by Jody Wilson's PCP, Dettinger, Fransisca Kaufmann, MD      [name] was given information about Chronic Care Management services today including:  CCM service includes personalized support from designated clinical staff supervised by her physician, including individualized plan of care and coordination with other care providers 24/7 contact phone numbers for assistance for urgent and routine care needs. Service will only be billed when office clinical staff spend 20 minutes or more in a month to coordinate care. Only one practitioner may furnish and bill the service in a calendar month. The patient may stop CCM services at any time (effective at the end of the month) by phone call to the office staff. The patient will be responsible for cost sharing (co-pay) of up to 20% of the service fee (after annual deductible is met).  Patient agreed to services and verbal consent obtained.   Follow up plan: Telephone appointment with care management team member scheduled for:08/11/2021  Noreene Larsson, [address], [place], [place] [address] Direct Dial: [phone] [email or web address]

## 2021-07-23 NOTE — Telephone Encounter (Signed)
I did a pa waiting for approval or denial

## 2021-07-23 NOTE — Telephone Encounter (Signed)
NA °No call back  °This encounter will be closed  °

## 2021-07-23 NOTE — Telephone Encounter (Signed)
Pt called stating she spoke to Dr. Terri Skains about a medication that she was taking & could not afford. Pt stated Dr. Terri Skains wrote her a prescription for icosapent ethyl instead, as it was more affordable, but Pt says that is still too unaffordable and would like to discuss other options (if any).

## 2021-07-24 DIAGNOSIS — Z780 Asymptomatic menopausal state: Secondary | ICD-10-CM | POA: Diagnosis not present

## 2021-07-24 DIAGNOSIS — M8588 Other specified disorders of bone density and structure, other site: Secondary | ICD-10-CM | POA: Diagnosis not present

## 2021-07-24 DIAGNOSIS — M810 Age-related osteoporosis without current pathological fracture: Secondary | ICD-10-CM | POA: Diagnosis not present

## 2021-07-24 LAB — TOXASSURE SELECT 13 (MW), URINE

## 2021-08-11 ENCOUNTER — Ambulatory Visit (INDEPENDENT_AMBULATORY_CARE_PROVIDER_SITE_OTHER): Payer: Medicare Other | Admitting: Nurse Practitioner

## 2021-08-11 ENCOUNTER — Encounter: Payer: Self-pay | Admitting: Nurse Practitioner

## 2021-08-11 ENCOUNTER — Ambulatory Visit (INDEPENDENT_AMBULATORY_CARE_PROVIDER_SITE_OTHER): Payer: Medicare Other | Admitting: Pharmacist

## 2021-08-11 ENCOUNTER — Ambulatory Visit (INDEPENDENT_AMBULATORY_CARE_PROVIDER_SITE_OTHER): Payer: Medicare Other

## 2021-08-11 ENCOUNTER — Other Ambulatory Visit: Payer: Self-pay

## 2021-08-11 VITALS — BP 122/77 | HR 65 | Temp 97.1°F | Resp 20 | Ht 64.0 in | Wt 146.0 lb

## 2021-08-11 DIAGNOSIS — S2232XA Fracture of one rib, left side, initial encounter for closed fracture: Secondary | ICD-10-CM

## 2021-08-11 DIAGNOSIS — R0781 Pleurodynia: Secondary | ICD-10-CM | POA: Diagnosis not present

## 2021-08-11 DIAGNOSIS — I10 Essential (primary) hypertension: Secondary | ICD-10-CM | POA: Diagnosis not present

## 2021-08-11 DIAGNOSIS — I6521 Occlusion and stenosis of right carotid artery: Secondary | ICD-10-CM | POA: Diagnosis not present

## 2021-08-11 DIAGNOSIS — M8000XD Age-related osteoporosis with current pathological fracture, unspecified site, subsequent encounter for fracture with routine healing: Secondary | ICD-10-CM

## 2021-08-11 MED ORDER — HYDROCODONE-ACETAMINOPHEN 5-325 MG PO TABS: 1.0000 | ORAL_TABLET | Freq: Four times a day (QID) | ORAL | 0 refills | Status: DC | PRN

## 2021-08-11 NOTE — Progress Notes (Signed)
   Subjective:    Patient ID: Jody Wilson, female    DOB: 1951/04/07, 70 y.o.   MRN: [id number]   Chief Complaint: Pain in ribs (Hit left side with trash can Sunday night/)   HPI Patient was pulling trash can to road and it was full and heavy. She slipped and fell and trash can fell over on her left flank. Has had constant pain since then. She is having left flank pain. Pain worsens with coughing, sneezing and deep breathing.    Review of Systems  Constitutional:  Negative for diaphoresis.  Eyes:  Negative for pain.  Respiratory:  Negative for shortness of breath.   Cardiovascular:  Negative for chest pain, palpitations and leg swelling.  Gastrointestinal:  Negative for abdominal pain.  Endocrine: Negative for polydipsia.  Skin:  Negative for rash.  Neurological:  Negative for dizziness, weakness and headaches.  Hematological:  Does not bruise/bleed easily.  All other systems reviewed and are negative.     Objective:   Physical Exam Vitals and nursing note reviewed.  Cardiovascular:     Rate and Rhythm: Normal rate and regular rhythm.     Comments: Pain to light palpation along left 1-11th ribs. Chest xray- possible fracture at 10 rib-Preliminary reading by Ronnald Collum, FNP  [facility]  Pulmonary:     Effort: Pulmonary effort is normal. No respiratory distress.     Breath sounds: Normal breath sounds. No stridor. No wheezing, rhonchi or rales.  Chest:     Chest wall: No tenderness.  Skin:    General: Skin is warm.     Comments: No left flak contusion    BP 122/77   Pulse 65   Temp (!) 97.1 F (36.2 C) (Temporal)   Resp 20   Ht '5\' 4"'$  (1.626 m)   Wt 146 lb (66.2 kg)   SpO2 97%   BMI 25.06 kg/m        Assessment & Plan:  KHRYSTA Wilson in today with chief complaint of Pain in ribs (Hit left side with trash can Sunday night/)   1. Rib pain on left side  - DG Ribs Unilateral W/Chest Left  2. Closed fracture of one rib of left side, initial encounter Deep  breath and cough every 2 hours while splinting area with pillow. - HYDROcodone-acetaminophen (NORCO/VICODIN) 5-325 MG tablet; Take 1 tablet by mouth every 6 (six) hours as needed for moderate pain.  Dispense: 30 tablet; Refill: 0    The above assessment and management plan was discussed with the patient. The patient verbalized understanding of and has agreed to the management plan. Patient is aware to call the clinic if symptoms persist or worsen. Patient is aware when to return to the clinic for a follow-up visit. Patient educated on when it is appropriate to go to the emergency department.   Mary-Margaret Hassell Done, FNP

## 2021-08-11 NOTE — Progress Notes (Signed)
Chronic Care Management Pharmacy Note  08/11/2021 Name:  Jody Wilson MRN:  [id number] DOB:  1951/09/06  Summary: OSTEOPOROSIS  Recommendations/Changes made from today's visit: Osteoporosis: Current treatment: WAS ON PROLIA--STOPPED DUE TO REPORTED MYOPATHY previously tried for Osteoporosis/Osteopenia: Tried both oral fosamax and actonel. Experienced shoulder and chest pain with both;  was on forteo (70yrmax); cannot tolerate prolia due to bone pain Evenity is too expensive DEXA FINDINGS (07/2021): AP LUMBAR SPINE L1-L4 Bone Mineral Density (BMD):  1.032 g/cm2 Young Adult T-Score:  -1.3 Z-Score:  0.2  LEFT FEMUR NECK Bone Mineral Density (BMD):  0.654 g/cm2 Young Adult T-Score: -2.8 Z-Score:  -1.2 ASSESSMENT: Patient's diagnostic category is OSTEOPOROSIS by WHO Criteria.  FRACTURE RISK: INCREASED COMPARISON: 01/31/2017. No significant change in bone mineral density of the hips. Supplementation: CALCIUM/VITAMIN D;  We have exhausted all treatment options at this point.  Will continue to discuss potentially trying another bisphos or restarting prolia (patient has pain at baseline, so we are unsure if medications are the culprit)  Follow Up Plan: Telephone follow up appointment with care management team member scheduled for:2 MONTHS    Subjective: Jody GORMANis an 70y.o. year old female who is a primary patient of Dettinger, JFransisca Kaufmann MD.  The CCM team was consulted for assistance with disease management and care coordination needs.    Engaged with patient by telephone for initial visit in response to provider referral for pharmacy case management and/or care coordination services.   Consent to Services:  The patient was given information about Chronic Care Management services, agreed to services, and gave verbal consent prior to initiation of services.  Please see initial visit note for detailed documentation.   Patient Care Team: Dettinger, JFransisca Kaufmann MD as PCP -  General (Family Medicine) DSteffanie Rainwater DPM as Consulting Physician (Podiatry) MArvella Nigh MD as Consulting Physician (Obstetrics and Gynecology) KDerek Jack MD as Consulting Physician (Hematology) OArta Silence MD as Consulting Physician (Gastroenterology) TRex Kras DO as Consulting Physician (Cardiology) PLavera Guise [facility] Pharmac[facility] (Family Medicine)  Objective:  Lab Results  Component Value Date   CREATININE 0.75 07/07/2021   CREATININE 0.70 06/10/2021   CREATININE 0.84 04/14/2021    No results found for: HGBA1C Last diabetic Eye exam: No results found for: HMDIABEYEEXA  Last diabetic Foot exam: No results found for: HMDIABFOOTEX      Component Value Date/Time   CHOL 192 07/07/2021 0848   TRIG 197 (H) 07/07/2021 0848   TRIG 193 (H) 10/17/2014 1249   HDL 70 07/07/2021 0848   HDL 47 10/17/2014 1249   CHOLHDL 2.6 04/14/2021 1155   CHOLHDL 3.3 07/10/2020 1327   VLDL 33 07/10/2020 1327   LDLCALC 89 07/07/2021 0848   LDLDIRECT 83 07/07/2021 0848    Hepatic Function Latest Ref Rng & Units 07/07/2021 06/10/2021 04/14/2021  Total Protein 6.0 - 8.5 g/dL 7.0 7.1 7.2  Albumin 3.8 - 4.8 g/dL 4.4 4.4 4.2  AST 0 - 40 IU/L 54(H) 37 30  ALT 0 - 32 IU/L _0 Alk Phosphatase 44 - 121 IU/L 103 92 85  Total Bilirubin 0.0 - 1.2 mg/dL 0.7 0.5 0.5    Lab Results  Component Value Date/Time   TSH 1.060 04/14/2021 11:55 AM   TSH 1.610 06/23/2020 02:32 AM   TSH 0.884 09/25/2019 02:42 PM   TSH 0.676 11/24/2018 09:11 AM    CBC Latest Ref Rng & Units 06/10/2021 04/14/2021 12/10/2020  WBC 3.4 - 10.8 x10E3/uL  7.8 7.1 5.2  Hemoglobin 11.1 - 15.9 g/dL 16.3(H) 15.5 15.9(H)  Hematocrit 34.0 - 46.6 % 47.4(H) 44.7 45.5  Platelets 150 - 450 x10E3/uL 284 279 209    Lab Results  Component Value Date/Time   VD25OH 18.0 (L) 02/08/2017 03:52 PM    Clinical ASCVD: No  The 10-year ASCVD risk score Mikey Bussing DC Jr., et al., 2013) is: 15.8%   Values used to calculate the  score:     Age: 70 years     Sex: Female     Is Non-Hispanic African American: No     Diabetic: No     Tobacco smoker: Yes     Systolic Blood Pressure: 70 mmHg     Is BP treated: Yes     HDL Cholesterol: 70 mg/dL     Total Cholesterol: 192 mg/dL    Other: (CHADS2VASc if Afib, PHQ9 if depression, MMRC or CAT for COPD, ACT, DEXA)  Social History   Tobacco Use  Smoking Status Every Day   Packs/day: 0.25   Types: Cigarettes  Smokeless Tobacco Never  Tobacco Comments   Trying to cut on number of cigarettes she smokes.   BP Readings from Last 3 Encounters:  08/11/21 122/77  07/22/21 (!) 161/79  07/06/21 133/80   Pulse Readings from Last 3 Encounters:  08/11/21 65  07/22/21 60  07/06/21 84   Wt Readings from Last 3 Encounters:  08/11/21 146 lb (66.2 kg)  07/22/21 150 lb (68 kg)  07/06/21 147 lb 8 oz (66.9 kg)    Assessment: Review of patient past medical history, allergies, medications, health status, including review of consultants reports, laboratory and other test data, was performed as part of comprehensive evaluation and provision of chronic care management services.   SDOH:  (Social Determinants of Health) assessments and interventions performed:    CCM Care Plan  Allergies  Allergen Reactions   Tetracyclines & Related Itching and Swelling    Medications Reviewed Today     Reviewed by Lavera Guise, [facility] (Pharmacist) on 08/18/21 at [place] List Status: <None>   Medication Order Taking? Sig Documenting Provider Last Dose Status Informant  ALPRAZolam (XANAX) 0.5 MG tablet [id number] No Take 1 tablet (0.5 mg total) by mouth 2 (two) times daily as needed for anxiety. Dettinger, Fransisca Kaufmann, MD Taking Active   aspirin EC 81 MG tablet [id number] No Take 1 tablet (81 mg total) by mouth daily. Swallow whole. Rex Kras, DO Taking Active Self           Med Note Roderic Ovens, GINA C   Mon Dec 08, 2020  1:33 PM)    bifidobacterium infantis (ALIGN) capsule [id number] No  See admin instructions. [provider] Taking Active   calcium-vitamin D (OSCAL WITH D) 500-200 MG-UNIT tablet [id number] No Take 1 tablet by mouth daily with lunch.  [provider] Taking Active Self           Med Note Gaspar Garbe Jul 28, 2020  7:58 AM)    citalopram (CELEXA) 20 MG tablet [id number] No Take 1 tablet (20 mg total) by mouth daily. Dettinger, Fransisca Kaufmann, MD Taking Active   clobetasol cream (TEMOVATE) 0.05 % [id number] No 1 application [provider] Taking Active   cyanocobalamin (,VITAMIN B-12,) 1000 MCG/ML injection [id number] No Inject 1 mL (1,000 mcg total) into the muscle every 28 (twenty-eight) days. Harriett Rush, PA-C Taking Active   ezetimibe (ZETIA) 10 MG tablet [id number] No Take 1 tablet (  10 mg total) by mouth daily. Rex Kras, DO Taking Active   folic acid (FOLVITE) 1 MG tablet [id number] No Take 1 mg by mouth daily. [provider] Taking Active Self  hydrochlorothiazide (MICROZIDE) 12.5 MG capsule [id number] No Take 1 capsule (12.5 mg total) by mouth daily. Dettinger, Fransisca Kaufmann, MD Taking Active   HYDROcodone-acetaminophen (NORCO/VICODIN) 5-325 MG tablet [id number]  Take 1 tablet by mouth every 6 (six) hours as needed for moderate pain. Hassell Done, Mary-Margaret, FNP  Active   icosapent Ethyl (VASCEPA) 1 g capsule [id number] No Take 2 capsules (2 g total) by mouth 2 (two) times daily. Rex Kras, DO Taking Active   Multiple Vitamins-Minerals (WOMENS 50+ MULTI VITAMIN/MIN [place]) [id number] No Take 1 tablet by mouth every Monday, Tuesday, Wednesday, Thursday, and Friday.  [provider] Taking Active Self           Med Note Gaspar Garbe Jul 28, 2020  7:58 AM)    mupirocin ointment (BACTROBAN) 2 % [id number] No Apply 1 application topically 3 (three) times daily. [provider] Taking Active   olmesartan (BENICAR) 40 MG tablet [id number] No Take 1 tablet (40 mg total) by mouth daily.  Dettinger, Fransisca Kaufmann, MD Taking Active   pantoprazole (PROTONIX) 40 MG tablet [id number] No Take 1 tablet (40 mg total) by mouth daily. Dettinger, Fransisca Kaufmann, MD Taking Active   pravastatin (PRAVACHOL) 40 MG tablet [id number] No Take 1 tablet (40 mg total) by mouth every evening. Rex Kras, DO Taking Active   tiZANidine (ZANAFLEX) 4 MG tablet [id number] No 1 tablet as needed [provider] Taking Active   tolterodine (DETROL LA) 4 MG 24 hr capsule [id number] No Take 1 capsule (4 mg total) by mouth daily. Dettinger, Fransisca Kaufmann, MD Taking Active   valACYclovir (VALTREX) 1000 MG tablet [id number] No Take 2 tablets (2,000 mg total) by mouth 2 (two) times daily. For 2 days  Patient taking differently: Take 1,000 mg by mouth 3 (three) times daily.   Claretta Fraise, MD Taking Active             Patient Active Problem List   Diagnosis Date Noted   Dyspnea on exertion 09/29/2020   Abnormal stress test 09/29/2020   Anxiety and depression 06/23/2020   Hiatal hernia 06/23/2020   Thoracic aortic atherosclerosis ([place]) 06/23/2020   Syncope 06/22/2020   Tobacco abuse 04/25/2020   Involuntary muscle jerks while sleeping 09/26/2019   B12 deficiency 09/25/2019   Iron deficiency anemia 09/25/2019   Elevated liver function tests 09/07/2019   Anemia 05/18/2018   Osteoporosis 02/09/2017   History of vertebral fracture 02/09/2017   GERD (gastroesophageal reflux disease) 03/11/2016   Hyperlipidemia with target LDL less than 100 02/19/2015   Adjustment disorder with mixed anxiety and depressed mood 03/18/2014   Hypertension 03/18/2014    Immunization History  Administered Date(s) Administered   Fluad Quad(high Dose 65+) 09/25/2019, 10/09/2020   Influenza, High Dose Seasonal PF 11/30/2018   Moderna Sars-Covid-2 Vaccination 02/25/2020, 03/24/2020, 10/21/2020   Pneumococcal Conjugate-13 03/15/2018   Pneumococcal Polysaccharide-23 05/30/2019    Conditions to be  addressed/monitored: OSTEOPOROSIS  Care Plan : PHARMD MEDICATION MANAGEMENT  Updates made by Lavera Guise, [place] since 08/18/2021 12:00 AM     Problem: DISEASE PROGRESSION PREVENTION      Long-Range Goal: OSTEOPOROSIS   This Visit's Progress: Not on track  Priority: High  Note:   Current Barriers:  Unable to independently afford treatment regimen Unable to achieve control of osteoporosis  Pharmacist Clinical Goal(s):  Over the next year days, patient will verbalize ability to afford treatment regimen achieve control of osteoporosis as evidenced by improved DEXA scan  through collaboration with PharmD and provider.    Interventions: 1:1 collaboration with Dettinger, Fransisca Kaufmann, MD regarding development and update of comprehensive plan of care as evidenced by provider attestation and co-signature Inter-disciplinary care team collaboration (see longitudinal plan of care) Comprehensive medication review performed; medication list updated in electronic medical record  Osteoporosis: Current treatment: WAS ON PROLIA--STOPPED DUE TO [place] previously tried for Osteoporosis/Osteopenia: Tried both oral fosamax and actonel. Experienced shoulder and chest pain with both;  was on forteo (49yrmax); cannot tolerate prolia due to bone pain Evenity is too expensive DEXA FINDINGS (07/2021): AP LUMBAR SPINE L1-L4 Bone Mineral Density (BMD):  1.032 g/cm2 Young Adult T-Score:  -1.3 Z-Score:  0.2  LEFT FEMUR NECK Bone Mineral Density (BMD):  0.654 g/cm2 Young Adult T-Score: -2.8 Z-Score:  -1.2 ASSESSMENT: Patient's diagnostic category is OSTEOPOROSIS by WHO Criteria.  FRACTURE RISK: INCREASED COMPARISON: 01/31/2017. No significant change in bone mineral density of the hips. Supplementation: CALCIUM/VITAMIN D;  We have exhausted all treatment options at this point.  Will continue to discuss potentially trying another bisphos or restarting prolia (patient has pain at baseline, so we  are unsure if medications are the culprit)   Patient Goals/Self-Care Activities Over the next year, patient will:  - take medications as prescribed engage in dietary modifications by INCREASING CALCIUM AND VITAMIN D INTAKE  Follow Up Plan: Telephone follow up appointment with care management team member scheduled for:2 MONTHS       Medication Assistance: None required.  Patient affirms current coverage meets needs.  [id number]: [phone]: [phone]  Follow Up:  Patient agrees to Care Plan and Follow-up.  Plan: Telephone follow up appointment with care management team member scheduled for:  3 MONTHS  JRegina Eck PharmD, BCPS Clinical Pharmacist, [place] Phone [phone]

## 2021-08-11 NOTE — Patient Instructions (Signed)
Rib Fracture  A rib fracture is a break or crack in one of the bones of the ribs. The ribs are long, curved bones that wrap around your chest and attach to your spine and your breastbone. The ribs protect your heart, lungs, and other organs in thechest. A broken or cracked rib is often painful but is not usually serious. Most rib fractures heal on their own over time. However, rib fractures can be more serious if multiple ribs are broken or if broken ribs move out of place andpush against other structures or organs. What are the causes? This condition is caused by: Repetitive movements with high force, such as pitching a baseball or having very bad coughing spells. A direct hit the chest, such as a sports injury, a car crash, or a fall. Cancer that has spread to the bones, which can weaken bones and cause them to break. What are the signs or symptoms? Symptoms of this condition include: Pain when you breathe in or cough. Pain when someone presses on the injured area. Feeling short of breath. How is this diagnosed? This condition is diagnosed with a physical exam and medical history. You may also have imaging tests, such as: Chest X-ray. CT scan. MRI. Bone scan. Chest ultrasound. How is this treated? Treatment for this condition depends on the severity of the fracture. Most rib fractures usually heal on their own in 1-3 months. Healing may take longer if you have a cough or if you do activities that make the injury worse. While you heal, you may be given medicines to control the pain. You will also be taughtdeep breathing exercises. Severe injuries may require hospitalization or surgery. Follow these instructions at home: Managing pain, stiffness, and swelling If directed, put ice on the injured area. To do this: Put ice in a plastic bag. Place a towel between your skin and the bag. Leave the ice on for 20 minutes, 2-3 times a day. Remove the ice if your skin turns bright red. This is  very important. If you cannot feel pain, heat, or cold, you have a greater risk of damage to the area. Take over-the-counter and prescription medicines only as told by your health care provider. Activity Avoid doing activities or movements that cause pain. Be careful during activities and avoid bumping the injured rib. Slowly increase your activity as told by your health care provider. General instructions Do deep breathing exercises as told by your health care provider. This helps prevent pneumonia, which is a common complication of a broken rib. Your health care provider may instruct you to: Take deep breaths several times a day. Try to cough several times a day, holding a pillow against the injured area. Use a device called incentive spirometer to practice deep breathing several times a day. Drink enough fluid to keep your urine pale yellow. Do not wear a rib belt or binder. These restrict breathing, which can lead to pneumonia. Keep all follow-up visits. This is important. Contact a health care provider if: You have a fever. Get help right away if: You have difficulty breathing or you are short of breath. You develop a cough that does not stop, or you cough up thick or bloody sputum. You have nausea, vomiting, or pain in your abdomen. Your pain gets worse and medicine does not help. These symptoms may represent a serious problem that is an emergency. Do not wait to see if the symptoms will go away. Get medical help right away. Call your local emergency  services (911 in the [place] Do not drive yourself to the hospital. Summary A rib fracture is a break or crack in one of the bones of the ribs. A broken or cracked rib is often painful but is not usually serious. Most rib fractures heal on their own over time. Treatment for this condition depends on the severity of the fracture. Avoid doing any activities or movements that cause pain. This information is not intended to replace advice  given to you by your health care provider. Make sure you discuss any questions you have with your healthcare provider. Document Revised: 03/28/2020 Document Reviewed: 03/28/2020 Elsevier Patient Education  [place].

## 2021-08-18 NOTE — Patient Instructions (Signed)
Visit Information  PATIENT GOALS:  Goals Addressed               This Visit's Progress     Patient Stated     OSTEOPOROSIS (pt-stated)        Current Barriers:  Unable to independently afford treatment regimen Unable to achieve control of osteoporosis   Pharmacist Clinical Goal(s):  Over the next year days, patient will verbalize ability to afford treatment regimen achieve control of osteoporosis as evidenced by improved DEXA scan through collaboration with PharmD and provider.    Interventions: 1:1 collaboration with Dettinger, Fransisca Kaufmann, MD regarding development and update of comprehensive plan of care as evidenced by provider attestation and co-signature Inter-disciplinary care team collaboration (see longitudinal plan of care) Comprehensive medication review performed; medication list updated in electronic medical record  Osteoporosis: Current treatment: WAS ON PROLIA--STOPPED DUE TO Olivia Lopez de Gutierrez previously tried for Osteoporosis/Osteopenia: Tried both oral fosamax and actonel. Experienced shoulder and chest pain with both;  was on forteo (15yrmax); cannot tolerate prolia due to bone pain Evenity is too expensive DEXA FINDINGS (07/2021): AP LUMBAR SPINE L1-L4 Bone Mineral Density (BMD):  1.032 g/cm2 Young Adult T-Score:  -1.3 Z-Score:  0.2  LEFT FEMUR NECK Bone Mineral Density (BMD):  0.654 g/cm2 Young Adult T-Score: -2.8 Z-Score:  -1.2 ASSESSMENT: Patient's diagnostic category is OSTEOPOROSIS by WHO Criteria.  FRACTURE RISK: INCREASED COMPARISON: 01/31/2017. No significant change in bone mineral density of the hips. Supplementation: CALCIUM/VITAMIN D;  We have exhausted all treatment options at this point.  Will continue to discuss potentially trying another bisphos or restarting prolia (patient has pain at baseline, so we are unsure if medications are the culprit)   Patient Goals/Self-Care Activities Over the next year, patient will:  - take medications as  prescribed engage in dietary modifications by INCREASING CALCIUM AND VITAMIN D INTAKE  Follow Up Plan: Telephone follow up appointment with care management team member scheduled for:2 MONTHS          The patient verbalized understanding of instructions, educational materials, and care plan provided today and declined offer to receive copy of patient instructions, educational materials, and care plan.   Telephone follow up appointment with care management team member scheduled for:  Signature JRegina Eck PharmD, BCPS Clinical Pharmacist, [place] Phone [phone]

## 2021-08-27 ENCOUNTER — Ambulatory Visit: Payer: Medicare Other | Admitting: Pharmacist

## 2021-10-12 ENCOUNTER — Other Ambulatory Visit (HOSPITAL_COMMUNITY): Payer: Self-pay

## 2021-10-12 DIAGNOSIS — E559 Vitamin D deficiency, unspecified: Secondary | ICD-10-CM

## 2021-10-12 DIAGNOSIS — E538 Deficiency of other specified B group vitamins: Secondary | ICD-10-CM

## 2021-10-12 DIAGNOSIS — D509 Iron deficiency anemia, unspecified: Secondary | ICD-10-CM

## 2021-10-12 DIAGNOSIS — D751 Secondary polycythemia: Secondary | ICD-10-CM

## 2021-10-13 ENCOUNTER — Encounter (HOSPITAL_COMMUNITY): Payer: Self-pay | Admitting: Hematology

## 2021-10-13 NOTE — Progress Notes (Signed)
Orders placed and faxed to [facility] fax # [phone].

## 2021-10-14 ENCOUNTER — Telehealth (HOSPITAL_COMMUNITY): Payer: Medicare Other | Admitting: Physician Assistant

## 2021-10-22 ENCOUNTER — Encounter: Payer: Self-pay | Admitting: Family Medicine

## 2021-10-22 ENCOUNTER — Other Ambulatory Visit: Payer: Self-pay

## 2021-10-22 ENCOUNTER — Ambulatory Visit (INDEPENDENT_AMBULATORY_CARE_PROVIDER_SITE_OTHER): Payer: Medicare Other | Admitting: Family Medicine

## 2021-10-22 VITALS — BP 127/79 | HR 61 | Ht 64.0 in | Wt 148.0 lb

## 2021-10-22 DIAGNOSIS — E538 Deficiency of other specified B group vitamins: Secondary | ICD-10-CM | POA: Diagnosis not present

## 2021-10-22 DIAGNOSIS — Z23 Encounter for immunization: Secondary | ICD-10-CM | POA: Diagnosis not present

## 2021-10-22 DIAGNOSIS — F419 Anxiety disorder, unspecified: Secondary | ICD-10-CM

## 2021-10-22 DIAGNOSIS — E781 Pure hyperglyceridemia: Secondary | ICD-10-CM

## 2021-10-22 DIAGNOSIS — K219 Gastro-esophageal reflux disease without esophagitis: Secondary | ICD-10-CM | POA: Diagnosis not present

## 2021-10-22 DIAGNOSIS — F32A Depression, unspecified: Secondary | ICD-10-CM

## 2021-10-22 DIAGNOSIS — D751 Secondary polycythemia: Secondary | ICD-10-CM | POA: Diagnosis not present

## 2021-10-22 DIAGNOSIS — I6521 Occlusion and stenosis of right carotid artery: Secondary | ICD-10-CM

## 2021-10-22 DIAGNOSIS — E785 Hyperlipidemia, unspecified: Secondary | ICD-10-CM

## 2021-10-22 DIAGNOSIS — I10 Essential (primary) hypertension: Secondary | ICD-10-CM

## 2021-10-22 DIAGNOSIS — D509 Iron deficiency anemia, unspecified: Secondary | ICD-10-CM | POA: Diagnosis not present

## 2021-10-22 DIAGNOSIS — M810 Age-related osteoporosis without current pathological fracture: Secondary | ICD-10-CM | POA: Diagnosis not present

## 2021-10-22 MED ORDER — ALPRAZOLAM 0.5 MG PO TABS: 0.5000 mg | ORAL_TABLET | Freq: Two times a day (BID) | ORAL | 2 refills | Status: DC | PRN

## 2021-10-22 MED ORDER — OMEGA-3-ACID ETHYL ESTERS 1 G PO CAPS: 2.0000 g | ORAL_CAPSULE | Freq: Two times a day (BID) | ORAL | 3 refills | Status: DC

## 2021-10-22 MED ORDER — CITALOPRAM HYDROBROMIDE 40 MG PO TABS: 40.0000 mg | ORAL_TABLET | Freq: Every day | ORAL | 3 refills | Status: DC

## 2021-10-22 NOTE — Addendum Note (Signed)
Addended by: [name] on: 10/22/2021 02:32 PM   Modules accepted: Orders

## 2021-10-22 NOTE — Progress Notes (Signed)
BP 127/79   Pulse 61   Ht 5\' 4"  (1.626 m)   Wt 148 lb (67.1 kg)   SpO2 97%   BMI 25.40 kg/m    Subjective:   Patient ID: Jody Wilson, female    DOB: 29-Oct-1951, 70 y.o.   MRN: [id number]  HPI: Jody Wilson is a 70 y.o. female presenting on 10/22/2021 for Medical Management of Chronic Issues, Hypertension, Anxiety, Depression, and Hyperlipidemia   HPI Hypertension Patient is currently on hydrochlorothiazide and olmesartan, and their blood pressure today is 127/79. Patient denies any lightheadedness or dizziness. Patient denies headaches, blurred vision, chest pains, shortness of breath, or weakness. Denies any side effects from medication and is content with current medication.   Hyperlipidemia and hypertriglyceridemia Patient is coming in for recheck of his hyperlipidemia. The patient is currently taking Zetia and pravastatin, could not afford Vascepa. They deny any issues with myalgias or history of liver damage from it. They deny any focal numbness or weakness or chest pain.   Anxiety recheck She also takes citalopram and feels like she has been more down.  She had a couple of close losses in the family and somebody committed suicide she just is sleeping more and no appetite and does not feeling as good and feeling more depressed. Current rx-Xanax 0.5 twice daily as needed # meds rx-60 Effectiveness of current meds-works well Adverse reactions form meds-none  Pill count performed-no Last drug screen -08/25/2021 ( high risk q55m, moderate risk q57m, low risk yearly ) Urine drug screen today-no Was the [place] reviewed-yes  If yes were their any concerning findings? -None  No flowsheet data found.   Controlled substance contract signed on: 08/25/2021 Depression screen [facility] 2/9 10/22/2021 10/22/2021 10/22/2021 07/22/2021 04/14/2021  Decreased Interest 2 - 0 0 0  Down, Depressed, Hopeless - - 0 0 0  PHQ - 2 Score 2 - 0 0 0  Altered sleeping - 3 - 3 -  Tired, decreased energy 2 2  - 2 -  Change in appetite 2 2 - 2 -  Feeling bad or failure about yourself  - 0 - 0 -  Trouble concentrating - 0 - 0 -  Moving slowly or fidgety/restless - 0 - 0 -  Suicidal thoughts - 0 - 0 -  PHQ-9 Score - - - - -  Some recent data might be hidden     Relevant past medical, surgical, family and social history reviewed and updated as indicated. Interim medical history since our last visit reviewed. Allergies and medications reviewed and updated.  Review of Systems  Constitutional:  Negative for chills and fever.  Eyes:  Negative for visual disturbance.  Respiratory:  Negative for chest tightness and shortness of breath.   Cardiovascular:  Negative for chest pain and leg swelling.  Musculoskeletal:  Negative for back pain and gait problem.  Skin:  Negative for rash.  Neurological:  Negative for light-headedness and headaches.  Psychiatric/Behavioral:  Negative for agitation and behavioral problems.   All other systems reviewed and are negative.  Per HPI unless specifically indicated above   Allergies as of 10/22/2021       Reactions   Tetracyclines & Related Itching, Swelling        Medication List        Accurate as of October 22, 2021  1:52 PM. If you have any questions, ask your nurse or doctor.          STOP taking these medications  HYDROcodone-acetaminophen 5-325 MG tablet Commonly known as: NORCO/VICODIN Stopped by: Fransisca Kaufmann Carlen Rebuck, MD       TAKE these medications    ALPRAZolam 0.5 MG tablet Commonly known as: XANAX Take 1 tablet (0.5 mg total) by mouth 2 (two) times daily as needed for anxiety.   aspirin EC 81 MG tablet Take 1 tablet (81 mg total) by mouth daily. Swallow whole.   bifidobacterium infantis capsule See admin instructions.   calcium-vitamin D 500-200 MG-UNIT tablet Commonly known as: OSCAL WITH D Take 1 tablet by mouth daily with lunch.   citalopram 40 MG tablet Commonly known as: CELEXA Take 1 tablet (40 mg total) by  mouth daily. What changed:  medication strength how much to take Changed by: Fransisca Kaufmann Hellen Shanley, MD   clobetasol cream 0.05 % Commonly known as: TEMOVATE 1 application   cyanocobalamin 1000 MCG/ML injection Commonly known as: (VITAMIN B-12) Inject 1 mL (1,000 mcg total) into the muscle every 28 (twenty-eight) days.   ezetimibe 10 MG tablet Commonly known as: ZETIA Take 1 tablet (10 mg total) by mouth daily.   folic acid 1 MG tablet Commonly known as: FOLVITE Take 1 mg by mouth daily.   hydrochlorothiazide 12.5 MG capsule Commonly known as: MICROZIDE Take 1 capsule (12.5 mg total) by mouth daily.   icosapent Ethyl 1 g capsule Commonly known as: Vascepa Take 2 capsules (2 g total) by mouth 2 (two) times daily.   mupirocin ointment 2 % Commonly known as: BACTROBAN Apply 1 application topically 3 (three) times daily.   olmesartan 40 MG tablet Commonly known as: BENICAR Take 1 tablet (40 mg total) by mouth daily.   omega-3 acid ethyl esters 1 g capsule Commonly known as: Lovaza Take 2 capsules (2 g total) by mouth 2 (two) times daily. Started by: Fransisca Kaufmann Nevayah Faust, MD   pantoprazole 40 MG tablet Commonly known as: PROTONIX Take 1 tablet (40 mg total) by mouth daily.   pravastatin 40 MG tablet Commonly known as: PRAVACHOL Take 1 tablet (40 mg total) by mouth every evening.   tiZANidine 4 MG tablet Commonly known as: ZANAFLEX 1 tablet as needed   tolterodine 4 MG 24 hr capsule Commonly known as: DETROL LA Take 1 capsule (4 mg total) by mouth daily.   valACYclovir 1000 MG tablet Commonly known as: VALTREX Take 2 tablets (2,000 mg total) by mouth 2 (two) times daily. For 2 days What changed:  how much to take when to take this additional instructions   WOMENS 50+ MULTI VITAMIN/MIN PO Take 1 tablet by mouth every Monday, Tuesday, Wednesday, Thursday, and Friday.         Objective:   BP 127/79   Pulse 61   Ht 5\' 4"  (1.626 m)   Wt 148 lb (67.1 kg)    SpO2 97%   BMI 25.40 kg/m   Wt Readings from Last 3 Encounters:  10/22/21 148 lb (67.1 kg)  08/11/21 146 lb (66.2 kg)  07/22/21 150 lb (68 kg)    Physical Exam Vitals and nursing note reviewed.  Constitutional:      General: She is not in acute distress.    Appearance: She is well-developed. She is not diaphoretic.  Eyes:     Conjunctiva/sclera: Conjunctivae normal.  Cardiovascular:     Rate and Rhythm: Normal rate and regular rhythm.     Heart sounds: Normal heart sounds. No murmur heard. Pulmonary:     Effort: Pulmonary effort is normal. No respiratory distress.  Breath sounds: Normal breath sounds. No wheezing, rhonchi or rales.  Musculoskeletal:        General: No swelling or tenderness. Normal range of motion.  Skin:    General: Skin is warm and dry.     Findings: No rash.  Neurological:     Mental Status: She is alert and oriented to person, place, and time.     Coordination: Coordination normal.  Psychiatric:        Behavior: Behavior normal.      Assessment & Plan:   Problem List Items Addressed This Visit       Cardiovascular and Mediastinum   Hypertension   Relevant Medications   omega-3 acid ethyl esters (LOVAZA) 1 g capsule     Digestive   GERD (gastroesophageal reflux disease)     Other   Hyperlipidemia with target LDL less than 100   Relevant Medications   omega-3 acid ethyl esters (LOVAZA) 1 g capsule   Anxiety and depression - Primary   Relevant Medications   ALPRAZolam (XANAX) 0.5 MG tablet   citalopram (CELEXA) 40 MG tablet   Other Visit Diagnoses     Need for immunization against influenza       Relevant Orders   Flu Vaccine QUAD High Dose(Fluad) (Completed)   Hypertriglyceridemia       Relevant Medications   omega-3 acid ethyl esters (LOVAZA) 1 g capsule       Increase citalopram to 40 mg daily and will add Lovaza, patient could not afford Vascepa and is trying for prescription assistance for it through cardiology but in the  meantime we will try Lovaza.    Follow up plan: Return in about 3 months (around 01/22/2022), or if symptoms worsen or fail to improve, for Anxiety depression and hypertension and cholesterol.  Counseling provided for all of the vaccine components Orders Placed This Encounter  Procedures   Flu Vaccine QUAD High Dose(Fluad)    [name], MD [facility] 10/22/2021, 1:52 PM

## 2021-10-23 ENCOUNTER — Telehealth: Payer: Self-pay | Admitting: *Deleted

## 2021-10-23 DIAGNOSIS — I2510 Atherosclerotic heart disease of native coronary artery without angina pectoris: Secondary | ICD-10-CM

## 2021-10-23 DIAGNOSIS — E781 Pure hyperglyceridemia: Secondary | ICD-10-CM

## 2021-10-23 DIAGNOSIS — I700 Atherosclerosis of aorta: Secondary | ICD-10-CM

## 2021-10-23 DIAGNOSIS — I6521 Occlusion and stenosis of right carotid artery: Secondary | ICD-10-CM

## 2021-10-23 DIAGNOSIS — E785 Hyperlipidemia, unspecified: Secondary | ICD-10-CM

## 2021-10-23 LAB — CMP14+EGFR
ALT: 22 IU/L (ref 0–32)
AST: 39 IU/L (ref 0–40)
Albumin/Globulin Ratio: 1.8 (ref 1.2–2.2)
Albumin: 4.4 g/dL (ref 3.8–4.8)
Alkaline Phosphatase: 110 IU/L (ref 44–121)
BUN/Creatinine Ratio: 13 (ref 12–28)
BUN: 11 mg/dL (ref 8–27)
Bilirubin Total: 0.7 mg/dL (ref 0.0–1.2)
CO2: 23 mmol/L (ref 20–29)
Calcium: 9.9 mg/dL (ref 8.7–10.3)
Chloride: 99 mmol/L (ref 96–106)
Creatinine, Ser: 0.82 mg/dL (ref 0.57–1.00)
Globulin, Total: 2.4 g/dL (ref 1.5–4.5)
Glucose: 105 mg/dL — ABNORMAL HIGH (ref 70–99)
Potassium: 3.6 mmol/L (ref 3.5–5.2)
Sodium: 141 mmol/L (ref 134–144)
Total Protein: 6.8 g/dL (ref 6.0–8.5)
eGFR: 77 mL/min/{1.73_m2} (ref >59)

## 2021-10-23 LAB — FERRITIN: Ferritin: 136 ng/mL (ref 15–150)

## 2021-10-23 LAB — LIPID PANEL
Chol/HDL Ratio: 2.3 ratio (ref 0.0–4.4)
Cholesterol, Total: 185 mg/dL (ref 100–199)
HDL: 79 mg/dL (ref >39)
LDL Chol Calc (NIH): 80 mg/dL (ref 0–99)
Triglycerides: 153 mg/dL — ABNORMAL HIGH (ref 0–149)
VLDL Cholesterol Cal: 26 mg/dL (ref 5–40)

## 2021-10-23 LAB — CBC WITH DIFFERENTIAL/PLATELET
Basophils Absolute: 0 10*3/uL (ref 0.0–0.2)
Basos: 1 %
EOS (ABSOLUTE): 0.2 10*3/uL (ref 0.0–0.4)
Eos: 3 %
Hematocrit: 43.3 % (ref 34.0–46.6)
Hemoglobin: 15.1 g/dL (ref 11.1–15.9)
Immature Grans (Abs): 0 10*3/uL (ref 0.0–0.1)
Immature Granulocytes: 0 %
Lymphocytes Absolute: 2 10*3/uL (ref 0.7–3.1)
Lymphs: 31 %
MCH: 35.4 pg — ABNORMAL HIGH (ref 26.6–33.0)
MCHC: 34.9 g/dL (ref 31.5–35.7)
MCV: 101 fL — ABNORMAL HIGH (ref 79–97)
Monocytes Absolute: 0.5 10*3/uL (ref 0.1–0.9)
Monocytes: 8 %
Neutrophils Absolute: 3.7 10*3/uL (ref 1.4–7.0)
Neutrophils: 57 %
Platelets: 150 10*3/uL (ref 150–450)
RBC: 4.27 x10E6/uL (ref 3.77–5.28)
RDW: 12.2 % (ref 11.7–15.4)
WBC: 6.4 10*3/uL (ref 3.4–10.8)

## 2021-10-23 LAB — IRON AND TIBC
Iron Saturation: 68 % — ABNORMAL HIGH (ref 15–55)
Iron: 184 ug/dL — ABNORMAL HIGH (ref 27–139)
Total Iron Binding Capacity: 272 ug/dL (ref 250–450)
UIBC: 88 ug/dL — ABNORMAL LOW (ref 118–369)

## 2021-10-23 LAB — FOLATE: Folate: >20 ng/mL (ref >3.0)

## 2021-10-23 LAB — VITAMIN B12: Vitamin B-12: 680 pg/mL (ref 232–1245)

## 2021-10-23 LAB — VITAMIN D 25 HYDROXY (VIT D DEFICIENCY, FRACTURES): Vit D, 25-Hydroxy: 14.6 ng/mL — ABNORMAL LOW (ref 30.0–100.0)

## 2021-10-23 NOTE — Telephone Encounter (Signed)
Omega-3-acid Ethyl Esters 1GM capsul PA started   Key: [id number]  Sent to [employer]

## 2021-10-26 MED ORDER — VASCEPA 1 G PO CAPS: 2.0000 g | ORAL_CAPSULE | Freq: Two times a day (BID) | ORAL | 2 refills | Status: DC

## 2021-10-26 NOTE — Telephone Encounter (Signed)
Pt aware of provider feedback and will let us know if it is too expensive or not covered by insurance.

## 2021-10-26 NOTE — Telephone Encounter (Signed)
Please let the patient know that it looks like the Lovaza was not covered, I resent in the [place] and she may just have to try for prescription assistance for it.  If she would like an appointment with Lottie Dawson then please schedule that for prescription assistance

## 2021-10-26 NOTE — Telephone Encounter (Signed)
Your request has been denied Denied. This drug is not covered on the formulary. We are denying your request because we do not show that you have tried at least 2 covered drugs that can treat your condition. Other covered drug(s) is/are: fenofibrate micronized oral capsule 67 mg , 200 mg, 134 mg, fenofibrate oral tablet 145 mg, 160 mg, 48 mg, 54 mg, niacin er (antihyperlipidemic) oral tablet extended release 1000 mg, 500 mg, 750 mg, or Vascepa oral capsules 0.5 gm, 1 gm. We may be able to make an exception to cover this drug. Your doctor will need to send [place] medical records showing that you tried this drug. If you cannot take the covered drug, your doctor will need to tell us why. Note: Some covered drug(s) may have quantity limits. Please refer to the formulary for details.

## 2021-10-29 NOTE — Progress Notes (Signed)
Virtual Visit via Telephone Note [facility]  I connected with Jody Wilson  on 10/30/21  at  11:51 AM  by telephone and verified that I am speaking with the correct person using two identifiers.  Location: Patient: Home Provider: [facility]   I discussed the limitations, risks, security and privacy concerns of performing an evaluation and management service by telephone and the availability of in person appointments. I also discussed with the patient that there may be a patient responsible charge related to this service. The patient expressed understanding and agreed to proceed.   REASON FOR VISIT: Follow-up for normocytic anemia CURRENT THERAPY: Intermittent IV iron; daily A35 and folic acid supplement  INTERVAL HISTORY:  Jody Wilson 70 y.o. female is contacted today for follow-up of her normocytic anemia.  She was last seen by Tarri Abernethy PA-C on 07/06/2021.  At today's visit, she reports that she is doing fair.  She denies any recent hospitalization or changes in her baseline health status since her last visit.  She denies any major bleeding events such as hematemesis, hematochezia, melena, or epistaxis. She reports that she has been more fatigued lately, with energy at about 40%.  She wonders if this may be due to the stressful 6 months that she has had, and she has had some deaths and illnesses in her family lately. She admits to chronic dyspnea on exertion that is unchanged from baseline.  She continues to have occasional lightheaded episodes.  No chest pain or syncope.  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 She denies any aquagenic pruritus, headaches, or peripheral neuropathy.  No erythromelalgia or vasomotor symptoms.  She does not take any iron supplementation at home. She is compliant  with her folic acid pills and T73 shots.    She has 40% energy and 70% appetite. She endorses that she is maintaining a stable weight.    OBSERVATIONS/OBJECTIVE: Review of Systems  Constitutional:  Positive for malaise/fatigue. Negative for chills, diaphoresis, fever and weight loss.  Respiratory:  Positive for shortness of breath. Negative for cough.   Cardiovascular:  Negative for chest pain and palpitations.  Gastrointestinal:  Positive for diarrhea. Negative for abdominal pain, blood in stool, melena, nausea and vomiting.  Neurological:  Positive for dizziness. Negative for headaches.  Psychiatric/Behavioral:  Positive for depression. The patient is nervous/anxious.     PHYSICAL EXAM (per limitations of virtual telephone visit): The patient is alert and oriented x 3, exhibiting adequate mentation, good mood, and ability to speak in full sentences and execute sound judgement.   ASSESSMENT & PLAN: 1.  History of normocytic anemia, RESOLVED - Last colonoscopy (July 2021) was unremarkable - She reports recent EGD and colonoscopy via Dr. Paulita Fujita [facility] physicians in [place]), unable to find report in epic - She last received Feraheme infusion on 03/17/2020. - Denies any bleeding per rectum or melena. - She is not taking any oral iron suuplement. - Most recent labs (10/22/2021): Hgb 15.1, ferritin 136, iron saturation 68% - PLAN: No IV iron or intervention for anemia needed at this time.     2.  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  No vasomotor symptoms or erythromelalgia. 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  No further work-up or intervention planned at this time. - Repeat CBC in 6 months  3.  Vitamin D deficiency - Low vitamin D noted on labs from 10/22/2021, with vitamin D 14.6 - PLAN: Prescription for vitamin D 50,000 units weekly sent to pharmacy.  4.  Vitamin B12 deficiency: - Most recent B12 level (10/22/2021): Normal at 680 - PLAN: Continue B12 injections once a month, patient self administers at home.  Check B12 and methylmalonic acid in 6 months.   5.  Folic acid deficiency: - Most recent folate (10/22/2021): Normal at > 20.0 - PLAN: Continue folic acid 1 mg daily.  Check folate in 6 months.   6.  Osteoporosis: -DEXA scan on 01/31/2017 showed T score of -2.9. - Patient has refused continuation of Prolia injections due to bone pain experienced after treatment - PLAN: Encouraged patient to follow-up with PCP for repeat DEXA scan and other treatment options   7.  Tobacco abuse: - She smoked 1 pack/day for 40 years. - Current smoking status: 0.5 PPD, trying to quit. - CT lung cancer screening scan on 05/19/2021 was lung RADS 2. - PLAN: We will plan to repeat another LDCT scan in May 2023.   FOLLOW UP INSTRUCTIONS: Prescription for vitamin D 50,000 units weekly sent to pharmacy LDCT chest in 6 months Labs in 6 months RTC after labs and CT    I discussed the assessment and treatment plan with the patient. The patient was provided an opportunity to ask questions and all were answered. The patient agreed with the plan and demonstrated an understanding of the instructions.   The patient was advised to call back or seek an in-person evaluation if the symptoms worsen or if the condition fails to improve as anticipated.  I provided 23 minutes  of non-face-to-face time during this encounter.   Harriett Rush, PA-C 10/30/21 2:26 PM

## 2021-10-30 ENCOUNTER — Other Ambulatory Visit: Payer: Self-pay

## 2021-10-30 ENCOUNTER — Inpatient Hospital Stay (HOSPITAL_COMMUNITY): Payer: Medicare Other | Attending: Physician Assistant | Admitting: Physician Assistant

## 2021-10-30 DIAGNOSIS — Z87891 Personal history of nicotine dependence: Secondary | ICD-10-CM

## 2021-10-30 DIAGNOSIS — D509 Iron deficiency anemia, unspecified: Secondary | ICD-10-CM

## 2021-10-30 DIAGNOSIS — D751 Secondary polycythemia: Secondary | ICD-10-CM | POA: Diagnosis not present

## 2021-10-30 DIAGNOSIS — E559 Vitamin D deficiency, unspecified: Secondary | ICD-10-CM

## 2021-10-30 DIAGNOSIS — E538 Deficiency of other specified B group vitamins: Secondary | ICD-10-CM | POA: Diagnosis not present

## 2021-10-30 MED ORDER — ERGOCALCIFEROL 1.25 MG (50000 UT) PO CAPS
50000.0000 [IU] | ORAL_CAPSULE | ORAL | 3 refills | Status: DC
Start: 2021-10-30 — End: 2023-01-07

## 2021-11-03 ENCOUNTER — Ambulatory Visit: Payer: Medicare Other

## 2021-11-03 ENCOUNTER — Other Ambulatory Visit: Payer: Self-pay

## 2021-11-03 DIAGNOSIS — I6521 Occlusion and stenosis of right carotid artery: Secondary | ICD-10-CM

## 2021-11-18 ENCOUNTER — Other Ambulatory Visit: Payer: Self-pay

## 2021-11-18 ENCOUNTER — Ambulatory Visit: Payer: Medicare Other | Admitting: Cardiology

## 2021-11-18 ENCOUNTER — Encounter: Payer: Self-pay | Admitting: Cardiology

## 2021-11-18 VITALS — BP 147/84 | HR 55 | Resp 16 | Ht 64.0 in | Wt 148.0 lb

## 2021-11-18 DIAGNOSIS — I2510 Atherosclerotic heart disease of native coronary artery without angina pectoris: Secondary | ICD-10-CM

## 2021-11-18 DIAGNOSIS — E781 Pure hyperglyceridemia: Secondary | ICD-10-CM | POA: Diagnosis not present

## 2021-11-18 DIAGNOSIS — I2584 Coronary atherosclerosis due to calcified coronary lesion: Secondary | ICD-10-CM | POA: Diagnosis not present

## 2021-11-18 DIAGNOSIS — I10 Essential (primary) hypertension: Secondary | ICD-10-CM

## 2021-11-18 DIAGNOSIS — Z87891 Personal history of nicotine dependence: Secondary | ICD-10-CM | POA: Diagnosis not present

## 2021-11-18 DIAGNOSIS — I6521 Occlusion and stenosis of right carotid artery: Secondary | ICD-10-CM

## 2021-11-18 DIAGNOSIS — E782 Mixed hyperlipidemia: Secondary | ICD-10-CM

## 2021-11-18 DIAGNOSIS — I700 Atherosclerosis of aorta: Secondary | ICD-10-CM | POA: Diagnosis not present

## 2021-11-18 MED ORDER — VASCEPA 1 G PO CAPS: 2.0000 g | ORAL_CAPSULE | Freq: Two times a day (BID) | ORAL | 0 refills | Status: DC

## 2021-11-18 NOTE — Addendum Note (Signed)
Addended by: Candis Musa on: 11/18/2021 11:05 AM   Modules accepted: Orders

## 2021-11-18 NOTE — Progress Notes (Signed)
ID:  Jody Wilson, DOB [birth year], MRN [id number]  PCP:  Dettinger, Fransisca Kaufmann, MD  Cardiologist:  Rex Kras, DO, [facility] (established care 07/29/2020)  Date: 11/18/21 Last Office Visit: 05/25/2021  Chief Complaint  Patient presents with    carotid stenosis   Results   Follow-up    HPI  Jody Wilson is a 70 y.o. female who presents to the office with a chief complaint of " follow up carotid disease." Her past medical history and cardiovascular risk factors include: hypertension, hyperlipidemia, GERD, former smoker, Thoracic aortic atherosclerosis, coronary artery calcification, advanced age, postmenopausal female.  Patient is referred to the office at the request of her primary care provider for evaluation of syncope.  Patient underwent an ischemic work-up as well as a carotid duplex.  Results noted below for further reference.  Patient was noted to have right ICA stenosis which is being followed by carotid duplex.  She had a recent carotid duplex which notes improvement in the right ICA stenosis.  She is currently on aspirin, statin therapy.  Most recent lipid profile from 10/22/2021 independently reviewed.  LDL is within acceptable range however, triglycerides are not well controlled.  Patient was given a prescription for Vascepa which was expensive and cost prohibitive.  Patient states that her PCP also provided a similar prescription which was generic and that too was expensive.  She has not had a chance to apply for patient assistance program.  She denies any chest pain at rest or with effort related activities.  Her shortness of breath with effort related activities remains relatively chronic and stable.  Patient had quit smoking since establishing care with myself.  However, in the interim due to stress she briefly started smoking again and now has stopped.    FUNCTIONAL STATUS: Walks 63min everyday 4 days a week.    ALLERGIES: Allergies  Allergen Reactions   Tetracyclines  & Related Itching and Swelling    MEDICATION LIST PRIOR TO VISIT: Current Meds  Medication Sig   ALPRAZolam (XANAX) 0.5 MG tablet Take 1 tablet (0.5 mg total) by mouth 2 (two) times daily as needed for anxiety.   amoxicillin (AMOXIL) 500 MG capsule Take 500 mg by mouth 3 (three) times daily.   aspirin EC 81 MG tablet Take 1 tablet (81 mg total) by mouth daily. Swallow whole.   calcium-vitamin D (OSCAL WITH D) 500-200 MG-UNIT tablet Take 1 tablet by mouth daily with lunch.    citalopram (CELEXA) 40 MG tablet Take 1 tablet (40 mg total) by mouth daily.   cyanocobalamin (,VITAMIN B-12,) 1000 MCG/ML injection Inject 1 mL (1,000 mcg total) into the muscle every 28 (twenty-eight) days.   ergocalciferol (VITAMIN D2) 1.25 MG (50000 UT) capsule Take 1 capsule (50,000 Units total) by mouth once a week.   ezetimibe (ZETIA) 10 MG tablet Take 1 tablet (10 mg total) by mouth daily.   folic acid (FOLVITE) 1 MG tablet Take 1 mg by mouth daily.   hydrochlorothiazide (MICROZIDE) 12.5 MG capsule Take 1 capsule (12.5 mg total) by mouth daily.   Multiple Vitamins-Minerals (WOMENS 50+ MULTI VITAMIN/MIN PO) Take 1 tablet by mouth every Monday, Tuesday, Wednesday, Thursday, and Friday.    olmesartan (BENICAR) 40 MG tablet Take 1 tablet (40 mg total) by mouth daily.   pantoprazole (PROTONIX) 40 MG tablet Take 1 tablet (40 mg total) by mouth daily.   pravastatin (PRAVACHOL) 40 MG tablet Take 1 tablet (40 mg total) by mouth every evening.   triazolam (HALCION) 0.25  MG tablet Take 0.5 mg by mouth 2 (two) times daily.   VASCEPA 1 g capsule Take 2 capsules (2 g total) by mouth 2 (two) times daily.     PAST MEDICAL HISTORY: Past Medical History:  Diagnosis Date   Anemia    Anxiety    Aortic atherosclerosis (HCC)    Depression    GERD (gastroesophageal reflux disease)    Hyperlipidemia    Hypertension    Orthostatic hypotension    Osteoporosis     PAST SURGICAL HISTORY: Past Surgical History:  Procedure  Laterality Date   ABDOMINAL HYSTERECTOMY     BACK SURGERY     CHOLECYSTECTOMY     FOOT SURGERY     7 in the past   IR GENERIC HISTORICAL  12/09/2016   IR RADIOLOGIST EVAL & MGMT 12/09/2016 [facility]   IR GENERIC HISTORICAL  12/10/2016   IR VERTEBROPLASTY CERV/THOR BX INC UNI/BIL INC/INJECT/IMAGING 12/10/2016 [facility]   LEFT HEART CATH AND CORONARY ANGIOGRAPHY N/A 09/30/2020   Procedure: LEFT HEART CATH AND CORONARY ANGIOGRAPHY;  Surgeon: Nigel Mormon, MD;  Location: [facility];  Service: Cardiovascular;  Laterality: N/A;   TRACHEOSTOMY TUBE PLACEMENT  1982    FAMILY HISTORY: The patient family history includes Aneurysm in her paternal uncle; Atrial fibrillation in her half-sister; Breast cancer in her niece; Congestive Heart Failure in her mother; Diabetes in her mother; Heart attack in her maternal grandfather and maternal grandmother; Heart disease in her mother; Hyperlipidemia in her mother; Hypertension in her mother and son; Liver disease (age of onset: 63) in her son; Lupus in her half-sister; Other (age of onset: 34) in her son.  SOCIAL HISTORY:  The patient  reports that she has quit smoking. Her smoking use included cigarettes. She smoked an average of .25 packs per day. She has never been exposed to tobacco smoke. She has never used smokeless tobacco. She reports that she does not currently use alcohol. She reports that she does not use drugs.  REVIEW OF SYSTEMS: Review of Systems  Constitutional: Negative for chills and fever.  HENT:  Negative for hoarse voice and nosebleeds.   Eyes:  Negative for discharge, double vision and pain.  Cardiovascular:  Positive for dyspnea on exertion. Negative for chest pain, claudication, leg swelling, near-syncope, orthopnea, palpitations, paroxysmal nocturnal dyspnea and syncope.  Respiratory:  Negative for hemoptysis and shortness of breath.   Musculoskeletal:  Negative for muscle cramps and myalgias.   Gastrointestinal:  Negative for abdominal pain, constipation, diarrhea, hematemesis, hematochezia, melena, nausea and vomiting.  Neurological:  Negative for dizziness and light-headedness.   PHYSICAL EXAM: Vitals with BMI 11/18/2021 10/22/2021 08/11/2021  Height 5\' 4"  5\' 4"  5\' 4"   Weight 148 lbs 148 lbs 146 lbs  BMI 25.39 38.93 73.42  Systolic 876 811 572  Diastolic 84 79 77  Pulse 55 61 65   CONSTITUTIONAL: Well-developed and well-nourished. No acute distress.  SKIN: Skin is warm and dry. No rash noted. No cyanosis. No pallor. No jaundice HEAD: Normocephalic and atraumatic.  EYES: No scleral icterus MOUTH/THROAT: Moist oral membranes.  NECK: No JVD present. No thyromegaly noted. No carotid bruits  LYMPHATIC: No visible cervical adenopathy.  CHEST Normal respiratory effort. No intercostal retractions  LUNGS: Clear to auscultation bilaterally. No stridor. No wheezes. No rales.  CARDIOVASCULAR: Regular rate and rhythm, positive S1-S2, no murmurs rubs or gallops appreciated. ABDOMINAL: No apparent ascites.  EXTREMITIES: No peripheral edema  HEMATOLOGIC: No significant bruising NEUROLOGIC: Oriented to person, place,  and time. Nonfocal. Normal muscle tone.  PSYCHIATRIC: Normal mood and affect. Normal behavior. Cooperative  RADIOLOGY:  MR Abdomen w/ and w/o contrast 06/03/2020:  1. Area of concern on prior chest CT represents geographic fat intensification on a background of global mild hepatic steatosis. No suspicious lesion in the liver with lobular contours. Correlate with any clinical or laboratory evidence of liver disease. 2. Small focal outpouching approximately 7 x 7 mm along the distal thoracic aorta may represent a penetrating atherosclerotic ulcer not associated with acute features and unchanged since previous chest CT. Consider CT angiogram follow-up in 1 year with vascular surgery consultation to guide further follow-up. 3. Large hiatal hernia extending into the chest as on the  recent CT of the chest. 4. Compression fracture at T11 better demonstrated on recent chest CT. 5. Aortic atherosclerosis.  CT chest lung cancer screening: 04/25/2020: Mild coronary atherosclerosis of the LAD.  CARDIAC DATABASE: EKG: 11/18/2021: Sinus bradycardia, 51 bpm, normal axis, LAE, without underlying injury pattern.  Echocardiogram: 06/23/2020: LVEF 60-65%, indeterminate diastolic parameters, no pulmonary hypertension, RVSP 20 mmHg mild MR, aortic valve sclerosis without stenosis.  Stress Testing: Exercise Myoview stress test 08/13/2020: Exercise nuclear stress test was performed using Bruce protocol. Patient reached 5.0 METS, and 92% of age predicted maximum heart rate. Exercise capacity was low. Chest pain not reported. Exertional dyspnea and dizziness reported. Heart rate and hemodynamic response were normal Stress EKG demonstrated sinus tachycardia, 1.5-2 mm horizontal/upsloping ST depression in leads II, III, aVF, V5, V6, normalizing 2 min into recovery. EKG changes are positive for ischemia.  Normal myocardial perfusion. Stress LVEF 73%. Intermediate risk study due to EKG changes, low exercise capacity.   Heart Catheterization: 09/30/2020: Right dominant circulation Normal coronary arteries Normal LVEDP  Carotid duplex: 06/23/2020:  Right Carotid: Velocities in the right ICA are consistent with a 1-39% stenosis. Left Carotid: Velocities in the left ICA are consistent with a 1-39% stenosis. Vertebrals: Bilateral vertebral arteries demonstrate antegrade flow.   04/30/2021: Doppler velocity suggests stenosis in the right internal carotid artery (50-69%), lower limit of the spectrum. Doppler velocity suggests stenosis in the left internal carotid artery (1-15%). Antegrade right vertebral artery flow. Antegrade left vertebral artery flow. Compared to 06/23/2020, right ICA stenosis is new. Follow up in six months is appropriate if clinically indicated.  Carotid artery duplex  11/03/2021:  Duplex suggests stenosis in the right internal carotid artery (1-15%). Duplex suggests stenosis in the left internal carotid artery (minimal). Antegrade right vertebral artery flow. Antegrade left vertebral artery flow. Compared to the previous study done on 04/30/2021, right ICA stenosis of 50 to 69% in the lower limit of the spectrum he is now not evident. There is minimal plaque noted in the bilateral carotid arteries. Further studies if clinically indicated.  14 day extended Holter monitor: Dominant rhythm normal sinus. Heart rate 52 -150bpm.  Avg HR 73 bpm. No atrial fibrillation, ventricular tachycardia, high grade AV block, pauses (3 seconds or longer). Total ventricular ectopic burden <1%. Total supraventricular ectopic burden <1%. Patient triggered events: 9.  Underlying rhythm normal sinus with the exception of one event noted NSR with transition to SVT underlying rhythm difficult to determine due to baseline artifact.   LABORATORY DATA: CBC Latest Ref Rng & Units 10/22/2021 06/10/2021 04/14/2021  WBC 3.4 - 10.8 x10E3/uL 6.4 7.8 7.1  Hemoglobin 11.1 - 15.9 g/dL 15.1 16.3(H) 15.5  Hematocrit 34.0 - 46.6 % 43.3 47.4(H) 44.7  Platelets 150 - 450 x10E3/uL 150 284 279    CMP  Latest Ref Rng & Units 10/22/2021 07/07/2021 06/10/2021  Glucose 70 - 99 mg/dL 105(H) 97 104(H)  BUN 8 - 27 mg/dL 11 15 13   Creatinine 0.57 - 1.00 mg/dL 0.82 0.75 0.70  Sodium 134 - 144 mmol/L 141 140 141  Potassium 3.5 - 5.2 mmol/L 3.6 4.0 3.7  Chloride 96 - 106 mmol/L 99 101 98  CO2 20 - 29 mmol/L 23 24 27   Calcium 8.7 - 10.3 mg/dL 9.9 9.8 9.7  Total Protein 6.0 - 8.5 g/dL 6.8 7.0 7.1  Total Bilirubin 0.0 - 1.2 mg/dL 0.7 0.7 0.5  Alkaline Phos 44 - 121 IU/L 110 103 92  AST 0 - 40 IU/L 39 54(H) 37  ALT 0 - 32 IU/L 22 26 20     Lab Results  Component Value Date   CHOL 185 10/22/2021   HDL 79 10/22/2021   LDLCALC 80 10/22/2021   LDLDIRECT 83 07/07/2021   TRIG 153 (H) 10/22/2021   CHOLHDL 2.3  10/22/2021    No components found for: NTPROBNP No results for input(s): PROBNP in the last 8760 hours.  Recent Labs    04/14/21 1155  TSH 1.060    BMP Recent Labs    12/10/20 1011 04/14/21 1155 06/10/21 1339 07/07/21 0848 10/22/21 1458  NA 134*   < > 141 140 141  K 4.0   < > 3.7 4.0 3.6  CL 98   < > 98 101 99  CO2 27   < > 27 24 23   GLUCOSE 111*   < > 104* 97 105*  BUN 15   < > 13 15 11   CREATININE 0.69   < > 0.70 0.75 0.82  CALCIUM 9.2   < > 9.7 9.8 9.9  GFRNONAA >60  --   --   --   --    < > = values in this interval not displayed.    HEMOGLOBIN A1C No results found for: HGBA1C, MPG  IMPRESSION:    ICD-10-CM   1. Stenosis of right carotid artery  I65.21 EKG 12-Lead    VASCEPA 1 g capsule    PCV CAROTID DUPLEX (BILATERAL)    2. 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 Hypertriglyceridemia  E78.1 VASCEPA 1 g capsule    4. Aortic atherosclerosis (HCC)  I70.0 VASCEPA 1 g capsule    5. Mixed hyperlipidemia  E78.2     6. Essential hypertension  I10     7. Former smoker  Z87.891        RECOMMENDATIONS: RIVA SESMA is a 70 y.o. female whose past medical history and cardiac risk factors include:  hypertension, hyperlipidemia, GERD, former smoker, Thoracic aortic atherosclerosis, coronary artery calcification, advanced age, postmenopausal female.  Stenosis of right carotid artery Interval improvement of right ICA stenosis when compared to prior duplex results. Continue aspirin and statin therapy. Reeducated on the importance of complete smoking cessation. Repeat carotid duplex in 1 year prior to the annual follow-up visit Triglycerides still elevated.  We will send another prescription in for Vascepa with the intentions of her requiring patient assistance.  Patient will call the office if the prescription is still cost prohibitive.  Coronary atherosclerosis due to calcified  coronary lesion of native artery Denies any chest pain or anginal equivalent. Ischemic work-up reviewed as part of medical decision-making process during today's office visit. Educated on the importance of improving her modifiable cardiovascular risk factors.  Continue to monitor  Hypertriglyceridemia Educated on lifestyle changes to help improve her triglyceride levels. Prescription of Vascepa resent to the pharmacy.  Mixed hyperlipidemia Currently on pravastatin.   She denies myalgia or other side effects. Most recent lipids dated 10/22/2021 reviewed as noted above.  Essential hypertension Home blood pressures are well controlled. Office blood pressures are slightly elevated. Medications reconciled. Reemphasized the importance of low-salt diet. Currently managed by primary care provider.  Former smoker Initially quit 05/16/2021 with short relapse since last office visit. Currently smoke-free. Educated on importance of complete smoking cessation.   FINAL MEDICATION LIST END OF ENCOUNTER: Meds ordered this encounter  Medications   VASCEPA 1 g capsule    Sig: Take 2 capsules (2 g total) by mouth 2 (two) times daily.    Dispense:  360 capsule    Refill:  0     Medications Discontinued During This Encounter  Medication Reason   bifidobacterium infantis (ALIGN) capsule    cephALEXin (KEFLEX) 500 MG capsule    clobetasol cream (TEMOVATE) 0.05 %    mupirocin ointment (BACTROBAN) 2 %    tiZANidine (ZANAFLEX) 4 MG tablet    valACYclovir (VALTREX) 1000 MG tablet    VASCEPA 1 g capsule    tolterodine (DETROL LA) 4 MG 24 hr capsule      Current Outpatient Medications:    ALPRAZolam (XANAX) 0.5 MG tablet, Take 1 tablet (0.5 mg total) by mouth 2 (two) times daily as needed for anxiety., Disp: 60 tablet, Rfl: 2   amoxicillin (AMOXIL) 500 MG capsule, Take 500 mg by mouth 3 (three) times daily., Disp: , Rfl:    aspirin EC 81 MG tablet, Take 1 tablet (81 mg total) by mouth daily.  Swallow whole., Disp: 90 tablet, Rfl: 3   calcium-vitamin D (OSCAL WITH D) 500-200 MG-UNIT tablet, Take 1 tablet by mouth daily with lunch. , Disp: , Rfl:    citalopram (CELEXA) 40 MG tablet, Take 1 tablet (40 mg total) by mouth daily., Disp: 90 tablet, Rfl: 3   cyanocobalamin (,VITAMIN B-12,) 1000 MCG/ML injection, Inject 1 mL (1,000 mcg total) into the muscle every 28 (twenty-eight) days., Disp: 1 mL, Rfl: 6   ergocalciferol (VITAMIN D2) 1.25 MG (50000 UT) capsule, Take 1 capsule (50,000 Units total) by mouth once a week., Disp: 12 capsule, Rfl: 3   ezetimibe (ZETIA) 10 MG tablet, Take 1 tablet (10 mg total) by mouth daily., Disp: 90 tablet, Rfl: 1   folic acid (FOLVITE) 1 MG tablet, Take 1 mg by mouth daily., Disp: , Rfl:    hydrochlorothiazide (MICROZIDE) 12.5 MG capsule, Take 1 capsule (12.5 mg total) by mouth daily., Disp: 90 capsule, Rfl: 3   Multiple Vitamins-Minerals (WOMENS 50+ MULTI VITAMIN/MIN PO), Take 1 tablet by mouth every Monday, Tuesday, Wednesday, Thursday, and Friday. , Disp: , Rfl:    olmesartan (BENICAR) 40 MG tablet, Take 1 tablet (40 mg total) by mouth daily., Disp: 90 tablet, Rfl: 3   pantoprazole (PROTONIX) 40 MG tablet, Take 1 tablet (40 mg total) by mouth daily., Disp: 90 tablet, Rfl: 3   pravastatin (PRAVACHOL) 40 MG tablet, Take 1 tablet (40 mg total) by mouth every evening., Disp: 180 tablet, Rfl: 0   triazolam (HALCION) 0.25 MG tablet, Take 0.5 mg by mouth 2 (two) times daily., Disp: , Rfl:    VASCEPA 1 g capsule, Take 2 capsules (2 g total) by mouth 2 (two) times daily., Disp: 360 capsule, Rfl: 0  Orders Placed This Encounter  Procedures  EKG 12-Lead   PCV CAROTID DUPLEX (BILATERAL)   There are no Patient Instructions on file for this visit.   --Continue cardiac medications as reconciled in final medication list. We have gone to stop smoking and landing --Return in about 1 year (around 11/18/2022) for Follow up carotid disease. Or sooner if needed. --Continue  follow-up with your primary care physician regarding the management of your other chronic comorbid conditions.  Patient's questions and concerns were addressed to her satisfaction. She voices understanding of the instructions provided during this encounter.   This note was created using a voice recognition software as a result there may be grammatical errors inadvertently enclosed that do not reflect the nature of this encounter. Every attempt is made to correct such errors.  Rex Kras, [place], [facility]  Pager: [phone] Office: [phone]

## 2021-12-09 ENCOUNTER — Ambulatory Visit (INDEPENDENT_AMBULATORY_CARE_PROVIDER_SITE_OTHER): Payer: Medicare Other

## 2021-12-09 VITALS — Ht 64.0 in | Wt 148.0 lb

## 2021-12-09 DIAGNOSIS — R634 Abnormal weight loss: Secondary | ICD-10-CM | POA: Insufficient documentation

## 2021-12-09 DIAGNOSIS — Z860100 Personal history of colon polyps, unspecified: Secondary | ICD-10-CM | POA: Insufficient documentation

## 2021-12-09 DIAGNOSIS — R197 Diarrhea, unspecified: Secondary | ICD-10-CM | POA: Insufficient documentation

## 2021-12-09 DIAGNOSIS — Z0000 Encounter for general adult medical examination without abnormal findings: Secondary | ICD-10-CM

## 2021-12-09 DIAGNOSIS — R932 Abnormal findings on diagnostic imaging of liver and biliary tract: Secondary | ICD-10-CM | POA: Insufficient documentation

## 2021-12-09 NOTE — Progress Notes (Signed)
Subjective:   Jody Wilson is a 70 y.o. female who presents for Medicare Annual (Subsequent) preventive examination.  Virtual Visit via Telephone Note  I connected with  Jody Wilson on 12/09/21 at 11:15 AM EST by telephone and verified that I am speaking with the correct person using two identifiers.  Location: Patient: Home Provider: [facility] Persons participating in the virtual visit: patient/Nurse Health Advisor   I discussed the limitations, risks, security and privacy concerns of performing an evaluation and management service by telephone and the availability of in person appointments. The patient expressed understanding and agreed to proceed.  Interactive audio and video telecommunications were attempted between this nurse and patient, however failed, due to patient having technical difficulties OR patient did not have access to video capability.  We continued and completed visit with audio only.  Some vital signs may be absent or patient reported.   Jody Knaak E Kriti Katayama, LPN   Review of Systems     Cardiac Risk Factors include: advanced age (>68men, >32 women);sedentary lifestyle;smoking/ tobacco exposure;dyslipidemia;hypertension;Other (see comment), Risk factor comments: anemia, atherosclerosis     Objective:    Today's Vitals   12/09/21 1111  Weight: 148 lb (67.1 kg)  Height: 5\' 4"  (1.626 m)   Body mass index is 25.4 kg/m.  Advanced Directives 12/09/2021 10/30/2021 07/06/2021 12/18/2020 12/08/2020 09/30/2020 07/28/2020  Does Patient Have a Medical Advance Directive? No No No No No No No  Would patient like information on creating a medical advance directive? No - Patient declined No - Patient declined No - Patient declined - Yes (MAU/Ambulatory/Procedural Areas - Information given) Yes (MAU/Ambulatory/Procedural Areas - Information given) No - Patient declined    Current Medications (verified) Outpatient Encounter Medications as of 12/09/2021  Medication Sig    ALPRAZolam (XANAX) 0.5 MG tablet Take 1 tablet (0.5 mg total) by mouth 2 (two) times daily as needed for anxiety.   aspirin EC 81 MG tablet Take 1 tablet (81 mg total) by mouth daily. Swallow whole.   calcium-vitamin D (OSCAL WITH D) 500-200 MG-UNIT tablet Take 1 tablet by mouth daily with lunch.    citalopram (CELEXA) 40 MG tablet Take 1 tablet (40 mg total) by mouth daily.   cyanocobalamin (,VITAMIN B-12,) 1000 MCG/ML injection Inject 1 mL (1,000 mcg total) into the muscle every 28 (twenty-eight) days.   ergocalciferol (VITAMIN D2) 1.25 MG (50000 UT) capsule Take 1 capsule (50,000 Units total) by mouth once a week.   folic acid (FOLVITE) 1 MG tablet Take 1 mg by mouth daily.   hydrochlorothiazide (MICROZIDE) 12.5 MG capsule Take 1 capsule (12.5 mg total) by mouth daily.   Multiple Vitamins-Minerals (WOMENS 50+ MULTI VITAMIN/MIN PO) Take 1 tablet by mouth every Monday, Tuesday, Wednesday, Thursday, and Friday.    olmesartan (BENICAR) 40 MG tablet Take 1 tablet (40 mg total) by mouth daily.   pantoprazole (PROTONIX) 40 MG tablet Take 1 tablet (40 mg total) by mouth daily.   amoxicillin (AMOXIL) 500 MG capsule Take 500 mg by mouth 3 (three) times daily. (Patient not taking: Reported on 12/09/2021)   ezetimibe (ZETIA) 10 MG tablet Take 1 tablet (10 mg total) by mouth daily.   pravastatin (PRAVACHOL) 40 MG tablet Take 1 tablet (40 mg total) by mouth every evening.   triazolam (HALCION) 0.25 MG tablet Take 0.5 mg by mouth 2 (two) times daily. (Patient not taking: Reported on 12/09/2021)   VASCEPA 1 g capsule Take 2 capsules (2 g total) by mouth 2 (two) times daily. (  Patient not taking: Reported on 12/09/2021)   No facility-administered encounter medications on file as of 12/09/2021.    Allergies (verified) Tetracyclines & related   History: Past Medical History:  Diagnosis Date   Anemia    Anxiety    Aortic atherosclerosis (HCC)    Depression    GERD (gastroesophageal reflux disease)     Hyperlipidemia    Hypertension    Orthostatic hypotension    Osteoporosis    Past Surgical History:  Procedure Laterality Date   ABDOMINAL HYSTERECTOMY     BACK SURGERY     CHOLECYSTECTOMY     FOOT SURGERY     7 in the past   IR GENERIC HISTORICAL  12/09/2016   IR RADIOLOGIST EVAL & MGMT 12/09/2016 [facility]   IR GENERIC HISTORICAL  12/10/2016   IR VERTEBROPLASTY CERV/THOR BX INC UNI/BIL INC/INJECT/IMAGING 12/10/2016 [facility]   LEFT HEART CATH AND CORONARY ANGIOGRAPHY N/A 09/30/2020   Procedure: LEFT HEART CATH AND CORONARY ANGIOGRAPHY;  Surgeon: Nigel Mormon, MD;  Location: [facility];  Service: Cardiovascular;  Laterality: N/A;   TRACHEOSTOMY TUBE PLACEMENT  1982   Family History  Problem Relation Age of Onset   Heart disease Mother    Hypertension Mother    Hyperlipidemia Mother    Diabetes Mother    Congestive Heart Failure Mother    Aneurysm Paternal Uncle    Heart attack Maternal Grandmother    Heart attack Maternal Grandfather    Hypertension Son    Other Son 54       Kienbock's Disease   Liver disease Son 4       Gilbert's Disease   Lupus Half-Sister    Atrial fibrillation Half-Sister    Breast cancer Niece    Social History   Socioeconomic History   Marital status: Married    Spouse name: Jody Wilson   Number of children: 2   Years of education: 12   Highest education level: Associate degree: occupational, Hotel manager, or vocational program  Occupational History   Occupation: retired    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  Tobacco Use   Smoking status: Former    Packs/day: 0.25    Types: Cigarettes    Passive exposure: Never   Smokeless tobacco: Never   Tobacco comments:    Trying to cut on number of cigarettes she smokes.  Vaping Use   Vaping Use: Never used  Substance and Sexual Activity   Alcohol use: Not Currently    Comment: occassionally    Drug use: No   Sexual activity:  Not on file  Other Topics Concern   Not on file  Social History Narrative   Lives with her husband, Jody Wilson, and has two grown sons and two grand sons.   She visits family and has a once weekly outing with a friend for socializing.  She enjoys making beaded jewelry, reading and walking..   Social Determinants of Health   Financial Resource Strain: Low Risk    Difficulty of Paying Living Expenses: Not very hard  Food Insecurity: No Food Insecurity   Worried About Charity fundraiser in the Last Year: Never true   Ran Out of Food in the Last Year: Never true  Transportation Needs: No Transportation Needs   Lack of Transportation (Medical): No   Lack of Transportation (Non-Medical): No  Physical Activity: Insufficiently Active   Days of Exercise per Week: 2 days   Minutes of  Exercise per Session: 30 min  Stress: No Stress Concern Present   Feeling of Stress : Not at all  Social Connections: Moderately Isolated   Frequency of Communication with Friends and Family: More than three times a week   Frequency of Social Gatherings with Friends and Family: More than three times a week   Attends Religious Services: Never   Marine scientist or Organizations: No   Attends Archivist Meetings: Never   Marital Status: Married    Tobacco Counseling Counseling given: Yes Tobacco comments: Trying to cut on number of cigarettes she smokes.   Clinical Intake:  Pre-visit preparation completed: Yes  Pain : No/denies pain     BMI - recorded: 25.4 Nutritional Status: BMI 25 -29 Overweight Nutritional Risks: None Diabetes: No  How often do you need to have someone help you when you read instructions, pamphlets, or other written materials from your doctor or pharmacy?: 1 - Never  Diabetic? no  Interpreter Needed?: No  Information entered by :: Fauna Neuner, LPN   Activities of Daily Living In your present state of health, do you have any difficulty performing the  following activities: 12/09/2021  Hearing? N  Vision? N  Difficulty concentrating or making decisions? Y  Walking or climbing stairs? N  Dressing or bathing? N  Doing errands, shopping? N  Preparing Food and eating ? N  Using the Toilet? N  In the past six months, have you accidently leaked urine? Y  Comment couldn't afford Detrol-LA; wears pads for protection  Do you have problems with loss of bowel control? N  Managing your Medications? N  Managing your Finances? N  Housekeeping or managing your Housekeeping? N  Some recent data might be hidden    Patient Care Team: Dettinger, Fransisca Kaufmann, MD as PCP - General (Family Medicine) Steffanie Rainwater, DPM as Consulting Physician (Podiatry) Arvella Nigh, MD as Consulting Physician (Obstetrics and Gynecology) Derek Jack, MD as Consulting Physician (Hematology) Arta Silence, MD as Consulting Physician (Gastroenterology) Rex Kras, DO as Consulting Physician (Cardiology) Lavera Guise, [facility] as Pharmacist (Family Medicine)  Indicate any recent Medical Services you may have received from other than [facility] providers in the past year (date may be approximate).     Assessment:   This is a routine wellness examination for Lailanie.  Hearing/Vision screen Hearing Screening - Comments:: Denies hearing difficulties  Vision Screening - Comments:: Wears rx glasses - up to date with annual eye exams with [facility]  Dietary issues and exercise activities discussed: Current Exercise Habits: Home exercise routine, Type of exercise: walking, Time (Minutes): 20, Frequency (Times/Week): 2, Weekly Exercise (Minutes/Week): 40, Intensity: Mild, Exercise limited by: orthopedic condition(s);respiratory conditions(s);cardiac condition(s)   Goals Addressed             This Visit's Progress    DIET - INCREASE WATER INTAKE   On track    Have 3 meals a day   On track    Try to include lean protein, vegetables, fruits and whole grains.  If you  do not feel hungry between breakfast and supper- try having a snack like fruit and nuts, protein shake, or peanut butter crackers.        Depression Screen PHQ 2/9 Scores 12/09/2021 10/22/2021 10/22/2021 07/22/2021 04/14/2021 01/08/2021 12/08/2020  PHQ - 2 Score 0 2 0 0 0 0 0  PHQ- 9 Score 3 - - - - - -  Exception Documentation - - - - - - -  Fall Risk Fall Risk  12/09/2021 10/22/2021 08/11/2021 07/22/2021 04/14/2021  Falls in the past year? 1 0 1 0 0  Comment trash can fell on her when she was pushing it to the road - fx ribs - - - -  Number falls in past yr: 0 - 0 - -  Injury with Fall? 1 - 0 - -  Comment - - - - -  Risk for fall due to : History of fall(s);Orthopedic patient - History of fall(s) - -  Risk for fall due to: Comment - - - - -  Follow up Falls prevention discussed;Education provided - Education provided - -    FALL RISK PREVENTION PERTAINING TO THE HOME:  Any stairs in or around the home? No  If so, are there any without handrails? No  Home free of loose throw rugs in walkways, pet beds, electrical cords, etc? Yes  Adequate lighting in your home to reduce risk of falls? Yes   ASSISTIVE DEVICES UTILIZED TO PREVENT FALLS:  Life alert? No  Use of a cane, walker or w/c? No  Grab bars in the bathroom? Yes  Shower chair or bench in shower? Yes  Elevated toilet seat or a handicapped toilet? No   TIMED UP AND GO:  Was the test performed? No . Telephonic visit  Cognitive Function: MMSE - Mini Mental State Exam 11/30/2018  Orientation to time 5  Orientation to Place 5  Registration 3  Attention/ Calculation 5  Recall 3  Language- name 2 objects 2  Language- repeat 1  Language- follow 3 step command 3  Language- read & follow direction 1  Write a sentence 1  Copy design 1  Total score 30     6CIT Screen 12/09/2021 12/08/2020 12/04/2019  What Year? 0 points 0 points 0 points  What month? 0 points 0 points 0 points  What time? 0 points 0 points 0 points  Count  back from 20 0 points 0 points 0 points  Months in reverse 0 points 0 points 0 points  Repeat phrase 0 points 0 points 0 points  Total Score 0 0 0    Immunizations Immunization History  Administered Date(s) Administered   Fluad Quad(high Dose 65+) 09/25/2019, 10/09/2020, 10/22/2021   Influenza, High Dose Seasonal PF 11/30/2018   Moderna Sars-Covid-2 Vaccination 02/25/2020, 03/24/2020, 10/21/2020   Pneumococcal Conjugate-13 03/15/2018   Pneumococcal Polysaccharide-23 05/30/2019    TDAP status: Due, Education has been provided regarding the importance of this vaccine. Advised may receive this vaccine at local pharmacy or Health Dept. Aware to provide a copy of the vaccination record if obtained from local pharmacy or Health Dept. Verbalized acceptance and understanding.  Flu Vaccine status: Up to date  Pneumococcal vaccine status: Up to date  Covid-19 vaccine status: Completed vaccines  Qualifies for Shingles Vaccine? Yes   Zostavax completed No   Shingrix Completed?: No.    Education has been provided regarding the importance of this vaccine. Patient has been advised to call insurance company to determine out of pocket expense if they have not yet received this vaccine. Advised may also receive vaccine at local pharmacy or Health Dept. Verbalized acceptance and understanding.  Screening Tests Health Maintenance  Topic Date Due   COVID-19 Vaccine (4 - Booster for Moderna series) 12/16/2020   TETANUS/TDAP  07/22/2022 (Originally 12/19/1970)   Zoster Vaccines- Shingrix (1 of 2) 07/22/2022 (Originally 12/19/1970)   MAMMOGRAM  07/15/2022   DEXA SCAN  07/25/2023   COLONOSCOPY (Pts 45-66yrs Insurance  coverage will need to be confirmed)  07/17/2030   Pneumonia Vaccine 75+ Years old  Completed   INFLUENZA VACCINE  Completed   Hepatitis C Screening  Completed   HPV VACCINES  Aged Out    Health Maintenance  Health Maintenance Due  Topic Date Due   COVID-19 Vaccine (4 - Booster for  Moderna series) 12/16/2020    Colorectal cancer screening: Type of screening: Colonoscopy. Completed 07/17/2020. Repeat every 5 years  Mammogram status: Completed 07/15/2021. Repeat every year  Bone Density status: Completed 07/24/2021. Results reflect: Bone density results: OSTEOPOROSIS. Repeat every 5 years.  Lung Cancer Screening: (Low Dose CT Chest recommended if Age 71-80 years, 30 pack-year currently smoking OR have quit w/in 15years.) does not qualify.   Additional Screening:  Hepatitis C Screening: does qualify; Completed 06/15/2019  Vision Screening: Recommended annual ophthalmology exams for early detection of glaucoma and other disorders of the eye. Is the patient up to date with their annual eye exam?  Yes  Who is the provider or what is the name of the office in which the patient attends annual eye exams? [facility] If pt is not established with a provider, would they like to be referred to a provider to establish care? No .   Dental Screening: Recommended annual dental exams for proper oral hygiene  Community Resource Referral / Chronic Care Management: CRR required this visit?  No   CCM required this visit?  No      Plan:     I have personally reviewed and noted the following in the patients chart:   Medical and social history Use of alcohol, tobacco or illicit drugs  Current medications and supplements including opioid prescriptions.  Functional ability and status Nutritional status Physical activity Advanced directives List of other physicians Hospitalizations, surgeries, and ER visits in previous 12 months Vitals Screenings to include cognitive, depression, and falls Referrals and appointments  In addition, I have reviewed and discussed with patient certain preventive protocols, quality metrics, and best practice recommendations. A written personalized care plan for preventive services as well as general preventive health recommendations were provided  to patient.     Sandrea Hammond, LPN   32/11/2481   Nurse Notes: none

## 2021-12-09 NOTE — Patient Instructions (Signed)
Jody Wilson , Thank you for taking time to come for your Medicare Wellness Visit. I appreciate your ongoing commitment to your health goals. Please review the following plan we discussed and let me know if I can assist you in the future.   Screening recommendations/referrals: Colonoscopy: Done 07/17/2020 - Repeat in 5 years  Mammogram: Done 07/15/2021 - Repeat annually  Bone Density: Done 07/24/2021 - Repeat every 2 years  Recommended yearly ophthalmology/optometry visit for glaucoma screening and checkup Recommended yearly dental visit for hygiene and checkup  Vaccinations: Influenza vaccine: Done 10/22/2021 -Repeat annually  Pneumococcal vaccine: Done 03/15/2018 & 05/30/2019 Tdap vaccine: Due Shingles vaccine: Due   Covid-19: Done 02/25/2020, 03/24/2020, & 10/21/2020  Advanced directives: Advance directive discussed with you today. Even though you declined this today, please call our office should you change your mind, and we can give you the proper paperwork for you to fill out.   Conditions/risks identified: Aim for 30 minutes of exercise or brisk walking each day, drink 6-8 glasses of water and eat lots of fruits and vegetables.   Next appointment: Follow up in one year for your annual wellness visit    Preventive Care 65 Years and Older, Female Preventive care refers to lifestyle choices and visits with your health care provider that can promote health and wellness. What does preventive care include? A yearly physical exam. This is also called an annual well check. Dental exams once or twice a year. Routine eye exams. Ask your health care provider how often you should have your eyes checked. Personal lifestyle choices, including: Daily care of your teeth and gums. Regular physical activity. Eating a healthy diet. Avoiding tobacco and drug use. Limiting alcohol use. Practicing safe sex. Taking low-dose aspirin every day. Taking vitamin and mineral supplements as recommended by your  health care provider. What happens during an annual well check? The services and screenings done by your health care provider during your annual well check will depend on your age, overall health, lifestyle risk factors, and family history of disease. Counseling  Your health care provider may ask you questions about your: Alcohol use. Tobacco use. Drug use. Emotional well-being. Home and relationship well-being. Sexual activity. Eating habits. History of falls. Memory and ability to understand (cognition). Work and work Statistician. Reproductive health. Screening  You may have the following tests or measurements: Height, weight, and BMI. Blood pressure. Lipid and cholesterol levels. These may be checked every 5 years, or more frequently if you are over 95 years old. Skin check. Lung cancer screening. You may have this screening every year starting at age 30 if you have a 30-pack-year history of smoking and currently smoke or have quit within the past 15 years. Fecal occult blood test (FOBT) of the stool. You may have this test every year starting at age 41. Flexible sigmoidoscopy or colonoscopy. You may have a sigmoidoscopy every 5 years or a colonoscopy every 10 years starting at age 29. Hepatitis C blood test. Hepatitis B blood test. Sexually transmitted disease (STD) testing. Diabetes screening. This is done by checking your blood sugar (glucose) after you have not eaten for a while (fasting). You may have this done every 1-3 years. Bone density scan. This is done to screen for osteoporosis. You may have this done starting at age 11. Mammogram. This may be done every 1-2 years. Talk to your health care provider about how often you should have regular mammograms. Talk with your health care provider about your test results, treatment options,  and if necessary, the need for more tests. Vaccines  Your health care provider may recommend certain vaccines, such as: Influenza vaccine.  This is recommended every year. Tetanus, diphtheria, and acellular pertussis (Tdap, Td) vaccine. You may need a Td booster every 10 years. Zoster vaccine. You may need this after age 56. Pneumococcal 13-valent conjugate (PCV13) vaccine. One dose is recommended after age 16. Pneumococcal polysaccharide (PPSV23) vaccine. One dose is recommended after age 61. Talk to your health care provider about which screenings and vaccines you need and how often you need them. This information is not intended to replace advice given to you by your health care provider. Make sure you discuss any questions you have with your health care provider. Document Released: 01/02/2016 Document Revised: 08/25/2016 Document Reviewed: 10/07/2015 Elsevier Interactive Patient Education  2017 [employer] Prevention in the Home Falls can cause injuries. They can happen to people of all ages. There are many things you can do to make your home safe and to help prevent falls. What can I do on the outside of my home? Regularly fix the edges of walkways and driveways and fix any cracks. Remove anything that might make you trip as you walk through a door, such as a raised step or threshold. Trim any bushes or trees on the path to your home. Use bright outdoor lighting. Clear any walking paths of anything that might make someone trip, such as rocks or tools. Regularly check to see if handrails are loose or broken. Make sure that both sides of any steps have handrails. Any raised decks and porches should have guardrails on the edges. Have any leaves, snow, or ice cleared regularly. Use sand or salt on walking paths during winter. Clean up any spills in your garage right away. This includes oil or grease spills. What can I do in the bathroom? Use night lights. Install grab bars by the toilet and in the tub and shower. Do not use towel bars as grab bars. Use non-skid mats or decals in the tub or shower. If you need to sit  down in the shower, use a plastic, non-slip stool. Keep the floor dry. Clean up any water that spills on the floor as soon as it happens. Remove soap buildup in the tub or shower regularly. Attach bath mats securely with double-sided non-slip rug tape. Do not have throw rugs and other things on the floor that can make you trip. What can I do in the bedroom? Use night lights. Make sure that you have a light by your bed that is easy to reach. Do not use any sheets or blankets that are too big for your bed. They should not hang down onto the floor. Have a firm chair that has side arms. You can use this for support while you get dressed. Do not have throw rugs and other things on the floor that can make you trip. What can I do in the kitchen? Clean up any spills right away. Avoid walking on wet floors. Keep items that you use a lot in easy-to-reach places. If you need to reach something above you, use a strong step stool that has a grab bar. Keep electrical cords out of the way. Do not use floor polish or wax that makes floors slippery. If you must use wax, use non-skid floor wax. Do not have throw rugs and other things on the floor that can make you trip. What can I do with my stairs? Do not leave  any items on the stairs. Make sure that there are handrails on both sides of the stairs and use them. Fix handrails that are broken or loose. Make sure that handrails are as long as the stairways. Check any carpeting to make sure that it is firmly attached to the stairs. Fix any carpet that is loose or worn. Avoid having throw rugs at the top or bottom of the stairs. If you do have throw rugs, attach them to the floor with carpet tape. Make sure that you have a light switch at the top of the stairs and the bottom of the stairs. If you do not have them, ask someone to add them for you. What else can I do to help prevent falls? Wear shoes that: Do not have high heels. Have rubber bottoms. Are  comfortable and fit you well. Are closed at the toe. Do not wear sandals. If you use a stepladder: Make sure that it is fully opened. Do not climb a closed stepladder. Make sure that both sides of the stepladder are locked into place. Ask someone to hold it for you, if possible. Clearly mark and make sure that you can see: Any grab bars or handrails. First and last steps. Where the edge of each step is. Use tools that help you move around (mobility aids) if they are needed. These include: Canes. Walkers. Scooters. Crutches. Turn on the lights when you go into a dark area. Replace any light bulbs as soon as they burn out. Set up your furniture so you have a clear path. Avoid moving your furniture around. If any of your floors are uneven, fix them. If there are any pets around you, be aware of where they are. Review your medicines with your doctor. Some medicines can make you feel dizzy. This can increase your chance of falling. Ask your doctor what other things that you can do to help prevent falls. This information is not intended to replace advice given to you by your health care provider. Make sure you discuss any questions you have with your health care provider. Document Released: 10/02/2009 Document Revised: 05/13/2016 Document Reviewed: 01/10/2015 Elsevier Interactive Patient Education  2017 [employer].

## 2022-01-05 ENCOUNTER — Other Ambulatory Visit: Payer: Self-pay | Admitting: Cardiology

## 2022-01-05 DIAGNOSIS — E782 Mixed hyperlipidemia: Secondary | ICD-10-CM

## 2022-01-05 DIAGNOSIS — I6521 Occlusion and stenosis of right carotid artery: Secondary | ICD-10-CM

## 2022-01-15 ENCOUNTER — Encounter (HOSPITAL_COMMUNITY): Payer: Self-pay | Admitting: Hematology

## 2022-01-21 ENCOUNTER — Other Ambulatory Visit: Payer: Self-pay | Admitting: Cardiology

## 2022-01-21 DIAGNOSIS — E782 Mixed hyperlipidemia: Secondary | ICD-10-CM

## 2022-01-21 DIAGNOSIS — I6521 Occlusion and stenosis of right carotid artery: Secondary | ICD-10-CM

## 2022-01-22 ENCOUNTER — Encounter (HOSPITAL_COMMUNITY): Payer: Self-pay | Admitting: Hematology

## 2022-01-22 ENCOUNTER — Encounter: Payer: Self-pay | Admitting: Family Medicine

## 2022-01-22 ENCOUNTER — Ambulatory Visit (INDEPENDENT_AMBULATORY_CARE_PROVIDER_SITE_OTHER): Payer: Medicare Other | Admitting: Family Medicine

## 2022-01-22 VITALS — BP 114/73 | HR 56 | Ht 64.0 in | Wt 146.0 lb

## 2022-01-22 DIAGNOSIS — I10 Essential (primary) hypertension: Secondary | ICD-10-CM | POA: Diagnosis not present

## 2022-01-22 DIAGNOSIS — E785 Hyperlipidemia, unspecified: Secondary | ICD-10-CM

## 2022-01-22 DIAGNOSIS — F419 Anxiety disorder, unspecified: Secondary | ICD-10-CM

## 2022-01-22 DIAGNOSIS — I6521 Occlusion and stenosis of right carotid artery: Secondary | ICD-10-CM

## 2022-01-22 DIAGNOSIS — K219 Gastro-esophageal reflux disease without esophagitis: Secondary | ICD-10-CM | POA: Diagnosis not present

## 2022-01-22 DIAGNOSIS — F32A Depression, unspecified: Secondary | ICD-10-CM | POA: Diagnosis not present

## 2022-01-22 DIAGNOSIS — E782 Mixed hyperlipidemia: Secondary | ICD-10-CM | POA: Diagnosis not present

## 2022-01-22 DIAGNOSIS — I2584 Coronary atherosclerosis due to calcified coronary lesion: Secondary | ICD-10-CM | POA: Diagnosis not present

## 2022-01-22 DIAGNOSIS — E781 Pure hyperglyceridemia: Secondary | ICD-10-CM | POA: Diagnosis not present

## 2022-01-22 DIAGNOSIS — I2510 Atherosclerotic heart disease of native coronary artery without angina pectoris: Secondary | ICD-10-CM

## 2022-01-22 DIAGNOSIS — R300 Dysuria: Secondary | ICD-10-CM

## 2022-01-22 LAB — URINALYSIS
Bilirubin, UA: NEGATIVE
Glucose, UA: NEGATIVE
Ketones, UA: NEGATIVE
Leukocytes,UA: NEGATIVE
Nitrite, UA: NEGATIVE
Protein,UA: NEGATIVE
RBC, UA: NEGATIVE
Specific Gravity, UA: 1.025 (ref 1.005–1.030)
Urobilinogen, Ur: 0.2 mg/dL (ref 0.2–1.0)
pH, UA: 6 (ref 5.0–7.5)

## 2022-01-22 MED ORDER — ALPRAZOLAM 0.5 MG PO TABS: 0.5000 mg | ORAL_TABLET | Freq: Two times a day (BID) | ORAL | 2 refills | Status: DC | PRN

## 2022-01-22 MED ORDER — VASCEPA 1 G PO CAPS: 2.0000 g | ORAL_CAPSULE | Freq: Two times a day (BID) | ORAL | 3 refills | Status: DC

## 2022-01-22 MED ORDER — PRAVASTATIN SODIUM 40 MG PO TABS: 40.0000 mg | ORAL_TABLET | Freq: Every evening | ORAL | 3 refills | Status: DC

## 2022-01-22 NOTE — Progress Notes (Signed)
BP 114/73    Pulse (!) 56    Ht 5\' 4"  (1.626 m)    Wt 146 lb (66.2 kg)    SpO2 97%    BMI 25.06 kg/m    Subjective:   Patient ID: Jody Wilson, female    DOB: June 14, 1951, 71 y.o.   MRN: [id number]  HPI: Jody Wilson is a 71 y.o. female presenting on 01/22/2022 for Medical Management of Chronic Issues, Anxiety, Depression, Hypertension, and Urinary Tract Infection   HPI Hypertension Patient is currently on hctz and olmesartan, and their blood pressure today is 114/73. Patient denies any lightheadedness or dizziness. Patient denies headaches, blurred vision, chest pains, shortness of breath, or weakness. Denies any side effects from medication and is content with current medication.   Anxiety and depression Patient is coming in for anxiety and depression recheck.  She currently takes alprazolam Current rx-alprazolam 0.5 mg twice daily as needed # meds rx-60 Effectiveness of current meds-works well, also using Keppra to help with twitching and anxiety at night, sees neurology for this as well Adverse reactions form meds-none  Pill count performed-No Last drug screen -08/25/2021 ( high risk q50m, moderate risk q17m, low risk yearly ) Urine drug screen today- No Was the [facility] reviewed-yes  If yes were their any concerning findings? -None  No flowsheet data found.   Controlled substance contract signed on: 08/25/2021  Dysuria Patient is having 2 days of dysuria.  She has been having burning and itching and she thinks she has had some discharge as well, she feels like this has been going on for the past couple days and is not improving.  She does have some lower abdominal pressure as well.  Relevant past medical, surgical, family and social history reviewed and updated as indicated. Interim medical history since our last visit reviewed. Allergies and medications reviewed and updated.  Review of Systems  Constitutional:  Negative for chills and fever.  HENT:  Negative for  congestion, ear discharge and ear pain.   Eyes:  Negative for redness and visual disturbance.  Respiratory:  Negative for chest tightness and shortness of breath.   Cardiovascular:  Negative for chest pain and leg swelling.  Gastrointestinal:  Negative for abdominal pain.  Genitourinary:  Positive for dysuria, frequency and urgency. Negative for difficulty urinating, hematuria, vaginal bleeding, vaginal discharge and vaginal pain.  Musculoskeletal:  Negative for back pain and gait problem.  Skin:  Negative for rash.  Neurological:  Negative for light-headedness and headaches.  Psychiatric/Behavioral:  Negative for agitation and behavioral problems.   All other systems reviewed and are negative.  Per HPI unless specifically indicated above   Allergies as of 01/22/2022       Reactions   Tetracyclines & Related Itching, Swelling        Medication List        Accurate as of January 22, 2022  2:17 PM. If you have any questions, ask your nurse or doctor.          STOP taking these medications    amoxicillin 500 MG capsule Commonly known as: AMOXIL Stopped by: Fransisca Kaufmann Yasmeen Manka, MD       TAKE these medications    ALPRAZolam 0.5 MG tablet Commonly known as: XANAX Take 1 tablet (0.5 mg total) by mouth 2 (two) times daily as needed for anxiety.   aspirin EC 81 MG tablet Take 1 tablet (81 mg total) by mouth daily. Swallow whole.   calcium-vitamin D 500-200 MG-UNIT tablet  Commonly known as: OSCAL WITH D Take 1 tablet by mouth daily with lunch.   citalopram 40 MG tablet Commonly known as: CELEXA Take 1 tablet (40 mg total) by mouth daily.   cyanocobalamin 1000 MCG/ML injection Commonly known as: (VITAMIN B-12) Inject 1 mL (1,000 mcg total) into the muscle every 28 (twenty-eight) days.   ergocalciferol 1.25 MG (50000 UT) capsule Commonly known as: VITAMIN D2 Take 1 capsule (50,000 Units total) by mouth once a week.   ezetimibe 10 MG tablet Commonly known as:  ZETIA TAKE 1 TABLET DAILY   folic acid 1 MG tablet Commonly known as: FOLVITE Take 1 mg by mouth daily.   hydrochlorothiazide 12.5 MG capsule Commonly known as: MICROZIDE Take 1 capsule (12.5 mg total) by mouth daily.   levETIRAcetam 250 MG tablet Commonly known as: KEPPRA Take 250 mg by mouth at bedtime.   olmesartan 40 MG tablet Commonly known as: BENICAR Take 1 tablet (40 mg total) by mouth daily.   pantoprazole 40 MG tablet Commonly known as: PROTONIX Take 1 tablet (40 mg total) by mouth daily.   pravastatin 40 MG tablet Commonly known as: PRAVACHOL Take 1 tablet (40 mg total) by mouth every evening.   triazolam 0.25 MG tablet Commonly known as: HALCION Take 0.5 mg by mouth 2 (two) times daily.   Vascepa 1 g capsule Generic drug: icosapent Ethyl Take 2 capsules (2 g total) by mouth 2 (two) times daily.   WOMENS 50+ MULTI VITAMIN/MIN PO Take 1 tablet by mouth every Monday, Tuesday, Wednesday, Thursday, and Friday.         Objective:   BP 114/73    Pulse (!) 56    Ht 5\' 4"  (1.626 m)    Wt 146 lb (66.2 kg)    SpO2 97%    BMI 25.06 kg/m   Wt Readings from Last 3 Encounters:  01/22/22 146 lb (66.2 kg)  12/09/21 148 lb (67.1 kg)  11/18/21 148 lb (67.1 kg)    Physical Exam Vitals and nursing note reviewed.  Constitutional:      General: She is not in acute distress.    Appearance: She is well-developed. She is not diaphoretic.  Eyes:     Conjunctiva/sclera: Conjunctivae normal.  Cardiovascular:     Rate and Rhythm: Normal rate and regular rhythm.     Heart sounds: Normal heart sounds. No murmur heard. Pulmonary:     Effort: Pulmonary effort is normal. No respiratory distress.     Breath sounds: Normal breath sounds. No wheezing.  Abdominal:     General: Abdomen is flat. Bowel sounds are normal.  Musculoskeletal:        General: No tenderness. Normal range of motion.  Skin:    General: Skin is warm and dry.     Findings: No rash.  Neurological:      Mental Status: She is alert and oriented to person, place, and time.     Coordination: Coordination normal.  Psychiatric:        Behavior: Behavior normal.      Assessment & Plan:   Problem List Items Addressed This Visit       Cardiovascular and Mediastinum   Hypertension - Primary   Relevant Medications   pravastatin (PRAVACHOL) 40 MG tablet   VASCEPA 1 g capsule     Digestive   GERD (gastroesophageal reflux disease)     Other   Hyperlipidemia with target LDL less than 100   Relevant Medications   pravastatin (PRAVACHOL) 40  MG tablet   VASCEPA 1 g capsule   Anxiety and depression   Relevant Medications   ALPRAZolam (XANAX) 0.5 MG tablet   Hypertriglyceridemia   Relevant Medications   pravastatin (PRAVACHOL) 40 MG tablet   VASCEPA 1 g capsule   Other Visit Diagnoses     Dysuria       Relevant Orders   Urine Culture   Urinalysis   Mixed hyperlipidemia       Relevant Medications   pravastatin (PRAVACHOL) 40 MG tablet   VASCEPA 1 g capsule   Stenosis of right carotid artery       Relevant Medications   pravastatin (PRAVACHOL) 40 MG tablet   VASCEPA 1 g capsule   Coronary atherosclerosis due to calcified coronary lesion of native artery       Relevant Medications   pravastatin (PRAVACHOL) 40 MG tablet   VASCEPA 1 g capsule       Continue current medicine.  No major changes.  Will await urine results.  Follow up plan: Return in about 3 months (around 04/21/2022), or if symptoms worsen or fail to improve, for htn and anxiety.  Counseling provided for all of the vaccine components Orders Placed This Encounter  Procedures   Urine Culture   Urinalysis    [name], MD [facility] 01/22/2022, 2:17 PM

## 2022-01-24 LAB — URINE CULTURE

## 2022-02-12 ENCOUNTER — Other Ambulatory Visit (HOSPITAL_COMMUNITY): Payer: Self-pay | Admitting: Physician Assistant

## 2022-02-12 DIAGNOSIS — E538 Deficiency of other specified B group vitamins: Secondary | ICD-10-CM

## 2022-02-14 ENCOUNTER — Encounter (HOSPITAL_COMMUNITY): Payer: Self-pay | Admitting: Hematology

## 2022-03-17 ENCOUNTER — Telehealth: Payer: Self-pay | Admitting: Family Medicine

## 2022-03-17 NOTE — Telephone Encounter (Signed)
Pt believes she may have a tear in her private area. Wants to know if she has to have a referral from her PCP to see a Gynecologist? ?

## 2022-03-18 NOTE — Telephone Encounter (Signed)
Pt informed to call the GYN that she would like to go to and they would be able to tell her if they require a referral. Informed that it may depend on her insurance, but most GYN do not require a referral. Pt understood. ?

## 2022-03-23 ENCOUNTER — Encounter: Payer: Self-pay | Admitting: Nurse Practitioner

## 2022-03-23 ENCOUNTER — Encounter (HOSPITAL_COMMUNITY): Payer: Self-pay | Admitting: Hematology

## 2022-03-23 ENCOUNTER — Ambulatory Visit (INDEPENDENT_AMBULATORY_CARE_PROVIDER_SITE_OTHER): Payer: Medicare Other | Admitting: Nurse Practitioner

## 2022-03-23 VITALS — BP 90/56 | HR 71 | Temp 97.0°F | Resp 20 | Ht 64.0 in | Wt 144.0 lb

## 2022-03-23 DIAGNOSIS — Z0000 Encounter for general adult medical examination without abnormal findings: Secondary | ICD-10-CM

## 2022-03-23 DIAGNOSIS — N819 Female genital prolapse, unspecified: Secondary | ICD-10-CM

## 2022-03-23 NOTE — Progress Notes (Signed)
? ?  Subjective:  ? ? Patient ID: Jody Wilson, female    DOB: Nov 07, 1951, 71 y.o.   MRN: [id number] ? ? ?Chief Complaint: Possible tear in vagina (Thinks her bladder may have fallen as well/) ? ? ?HPI ?Patient thinks she has a tear in her vaginal area as well as her bladder dropped. ?Her husband said things dont look normal. ? ? ? ?Review of Systems  ?Constitutional:  Negative for diaphoresis.  ?Eyes:  Negative for pain.  ?Respiratory:  Negative for shortness of breath.   ?Cardiovascular:  Negative for chest pain, palpitations and leg swelling.  ?Gastrointestinal:  Negative for abdominal pain.  ?Endocrine: Negative for polydipsia.  ?Skin:  Negative for rash.  ?Neurological:  Negative for dizziness, weakness and headaches.  ?Hematological:  Does not bruise/bleed easily.  ?All other systems reviewed and are negative. ? ?   ?Objective:  ? Physical Exam ?Vitals reviewed.  ?Constitutional:   ?   Appearance: Normal appearance.  ?Cardiovascular:  ?   Rate and Rhythm: Normal rate and regular rhythm.  ?   Heart sounds: Normal heart sounds.  ?Pulmonary:  ?   Breath sounds: Normal breath sounds.  ?Genitourinary: ?   General: Normal vulva.  ?   Vagina: No vaginal discharge.  ?   Rectum: Normal.  ?   Comments:  Perineal or vaginal tears noted ?Mild bladder prolapse ?Skin: ?   General: Skin is warm.  ?Neurological:  ?   General: No focal deficit present.  ?   Mental Status: She is alert and oriented to person, place, and time.  ?Psychiatric:     ?   Mood and Affect: Mood normal.     ?   Behavior: Behavior normal.  ? ? ?BP (!) 90/56   Pulse 71   Temp (!) 97 ?F (36.1 ?C) (Temporal)   Resp 20   Ht '5\' 4"'$  (1.626 m)   Wt 144 lb (65.3 kg)   SpO2 97%   BMI 24.72 kg/m?  ? ? ? ?   ?Assessment & Plan:  ?Jody Wilson in today with chief complaint of Possible tear in vagina (Thinks her bladder may have fallen as well/) ? ? ?1. Normal external genitalia exam ?Avoid scratching'  ?no bubble bathes ?RTO prn ? ? ? ? ?The above  assessment and management plan was discussed with the patient. The patient verbalized understanding of and has agreed to the management plan. Patient is aware to call the clinic if symptoms persist or worsen. Patient is aware when to return to the clinic for a follow-up visit. Patient educated on when it is appropriate to go to the emergency department.  ? ?Mary-Margaret Hassell Done, FNP ? ? ? ? ?

## 2022-04-22 ENCOUNTER — Ambulatory Visit: Payer: Medicare Other | Admitting: Family Medicine

## 2022-05-06 ENCOUNTER — Encounter (HOSPITAL_COMMUNITY): Payer: Self-pay | Admitting: Hematology

## 2022-05-11 ENCOUNTER — Encounter (HOSPITAL_COMMUNITY): Payer: Self-pay | Admitting: Hematology

## 2022-05-12 ENCOUNTER — Other Ambulatory Visit: Payer: Self-pay | Admitting: Family Medicine

## 2022-05-12 DIAGNOSIS — I10 Essential (primary) hypertension: Secondary | ICD-10-CM

## 2022-05-21 ENCOUNTER — Ambulatory Visit (HOSPITAL_COMMUNITY): Admission: RE | Admit: 2022-05-21 | Payer: Medicare Other | Source: Ambulatory Visit

## 2022-05-26 ENCOUNTER — Telehealth (HOSPITAL_COMMUNITY): Payer: Self-pay | Admitting: *Deleted

## 2022-05-26 NOTE — Addendum Note (Signed)
Addended by: Renda Rolls A on: 05/26/2022 02:28 PM   Modules accepted: Orders

## 2022-05-26 NOTE — Telephone Encounter (Signed)
Patient called requesting to have July lab work drawn at [facility].  Orders changed to [employer] in system.

## 2022-05-27 ENCOUNTER — Ambulatory Visit (INDEPENDENT_AMBULATORY_CARE_PROVIDER_SITE_OTHER): Payer: Medicare Other | Admitting: Family Medicine

## 2022-05-27 ENCOUNTER — Encounter: Payer: Self-pay | Admitting: Family Medicine

## 2022-05-27 VITALS — BP 127/85 | HR 75 | Temp 97.8°F | Ht 64.0 in | Wt 147.0 lb

## 2022-05-27 DIAGNOSIS — F32A Depression, unspecified: Secondary | ICD-10-CM | POA: Diagnosis not present

## 2022-05-27 DIAGNOSIS — Z79899 Other long term (current) drug therapy: Secondary | ICD-10-CM | POA: Diagnosis not present

## 2022-05-27 DIAGNOSIS — I10 Essential (primary) hypertension: Secondary | ICD-10-CM

## 2022-05-27 DIAGNOSIS — F419 Anxiety disorder, unspecified: Secondary | ICD-10-CM

## 2022-05-27 DIAGNOSIS — E785 Hyperlipidemia, unspecified: Secondary | ICD-10-CM | POA: Diagnosis not present

## 2022-05-27 MED ORDER — PANTOPRAZOLE SODIUM 40 MG PO TBEC: 40.0000 mg | DELAYED_RELEASE_TABLET | Freq: Every day | ORAL | 3 refills | Status: DC

## 2022-05-27 MED ORDER — OLMESARTAN MEDOXOMIL 40 MG PO TABS: 40.0000 mg | ORAL_TABLET | Freq: Every day | ORAL | 3 refills | Status: DC

## 2022-05-27 MED ORDER — HYDROCHLOROTHIAZIDE 12.5 MG PO CAPS: 12.5000 mg | ORAL_CAPSULE | Freq: Every day | ORAL | 3 refills | Status: DC

## 2022-05-27 MED ORDER — ALPRAZOLAM 0.5 MG PO TABS: 0.5000 mg | ORAL_TABLET | Freq: Two times a day (BID) | ORAL | 2 refills | Status: DC | PRN

## 2022-05-27 NOTE — Progress Notes (Signed)
BP 127/85   Pulse 75   Temp 97.8 F (36.6 C)   Ht '5\' 4"'$  (1.626 m)   Wt 147 lb (66.7 kg)   SpO2 95%   BMI 25.23 kg/m    Subjective:   Patient ID: Jody Wilson, female    DOB: [birth year], 71 y.o.   MRN: [id number]  HPI: Jody Wilson is a 71 y.o. female presenting on 05/27/2022 for Medical Management of Chronic Issues, Hypertension, Anxiety, and Depression   HPI Hypertension Patient is currently on olmesartan and hydrochlorothiazide, and their blood pressure today is 127/85. Patient denies any lightheadedness or dizziness. Patient denies headaches, blurred vision, chest pains, shortness of breath, or weakness. Denies any side effects from medication and is content with current medication.   Hyperlipidemia Patient is coming in for recheck of his hyperlipidemia. The patient is currently taking pravastatin and Vascepa. They deny any issues with myalgias or history of liver damage from it. They deny any focal numbness or weakness or chest pain.   Anxiety and depression recheck Current rx-Xanax 0.5 mg twice daily as needed # meds rx-60 Effectiveness of current meds-works well Adverse reactions form meds-none  Pill count performed-No Last drug screen -08/25/2021 ( high risk q2m moderate risk q69mlow risk yearly ) Urine drug screen today- Yes Was the [place]yes  If yes were their any concerning findings? -None  No flowsheet data found. Controlled substance contract signed on: Today  Relevant past medical, surgical, family and social history reviewed and updated as indicated. Interim medical history since our last visit reviewed. Allergies and medications reviewed and updated.  Review of Systems  Constitutional:  Negative for chills and fever.  Eyes:  Negative for visual disturbance.  Respiratory:  Negative for chest tightness and shortness of breath.   Cardiovascular:  Negative for chest pain and leg swelling.  Musculoskeletal:  Negative for back pain and gait  problem.  Skin:  Negative for rash.  Neurological:  Negative for dizziness, light-headedness and headaches.  Psychiatric/Behavioral:  Negative for agitation and behavioral problems.   All other systems reviewed and are negative.   Per HPI unless specifically indicated above   Allergies as of 05/27/2022       Reactions   Tetracyclines & Related Itching, Swelling        Medication List        Accurate as of May 27, 2022  1:31 PM. If you have any questions, ask your nurse or doctor.          STOP taking these medications    triazolam 0.25 MG tablet Commonly known as: HALCION Stopped by: JoFransisca Kaufmannettinger, MD       TAKE these medications    ALPRAZolam 0.5 MG tablet Commonly known as: XANAX Take 1 tablet (0.5 mg total) by mouth 2 (two) times daily as needed for anxiety.   aspirin EC 81 MG tablet Take 1 tablet (81 mg total) by mouth daily. Swallow whole.   calcium-vitamin D 500-200 MG-UNIT tablet Commonly known as: OSCAL WITH D Take 1 tablet by mouth daily with lunch.   citalopram 40 MG tablet Commonly known as: CELEXA Take 1 tablet (40 mg total) by mouth daily.   cyanocobalamin 1000 MCG/ML injection Commonly known as: (VITAMIN B-12) INJECT 1 ML INTO MUSCLE EVERY 28 DAYS   ergocalciferol 1.25 MG (50000 UT) capsule Commonly known as: VITAMIN D2 Take 1 capsule (50,000 Units total) by mouth once a week.   ezetimibe 10 MG tablet Commonly known as:  ZETIA TAKE 1 TABLET DAILY   folic acid 1 MG tablet Commonly known as: FOLVITE Take 1 mg by mouth daily.   hydrochlorothiazide 12.5 MG capsule Commonly known as: MICROZIDE Take 1 capsule (12.5 mg total) by mouth daily. What changed: See the new instructions. Changed by: Worthy Rancher, MD   levETIRAcetam 250 MG tablet Commonly known as: KEPPRA Take 250 mg by mouth at bedtime.   olmesartan 40 MG tablet Commonly known as: BENICAR Take 1 tablet (40 mg total) by mouth daily.   pantoprazole 40 MG  tablet Commonly known as: PROTONIX Take 1 tablet (40 mg total) by mouth daily.   pravastatin 40 MG tablet Commonly known as: PRAVACHOL Take 1 tablet (40 mg total) by mouth every evening.   Vascepa 1 g capsule Generic drug: icosapent Ethyl Take 2 capsules (2 g total) by mouth 2 (two) times daily.   WOMENS 50+ MULTI VITAMIN/MIN PO Take 1 tablet by mouth every Monday, Tuesday, Wednesday, Thursday, and Friday.         Objective:   BP 127/85   Pulse 75   Temp 97.8 F (36.6 C)   Ht '5\' 4"'$  (1.626 m)   Wt 147 lb (66.7 kg)   SpO2 95%   BMI 25.23 kg/m   Wt Readings from Last 3 Encounters:  05/27/22 147 lb (66.7 kg)  03/23/22 144 lb (65.3 kg)  01/22/22 146 lb (66.2 kg)    Physical Exam Vitals and nursing note reviewed.  Constitutional:      General: She is not in acute distress.    Appearance: She is well-developed. She is not diaphoretic.  Eyes:     Conjunctiva/sclera: Conjunctivae normal.  Cardiovascular:     Rate and Rhythm: Normal rate and regular rhythm.     Heart sounds: Normal heart sounds. No murmur heard. Pulmonary:     Effort: Pulmonary effort is normal. No respiratory distress.     Breath sounds: Normal breath sounds. No wheezing.  Musculoskeletal:        General: No swelling or tenderness. Normal range of motion.  Skin:    General: Skin is warm and dry.     Findings: No rash.  Neurological:     Mental Status: She is alert and oriented to person, place, and time.     Coordination: Coordination normal.  Psychiatric:        Behavior: Behavior normal.       Assessment & Plan:   Problem List Items Addressed This Visit       Cardiovascular and Mediastinum   Hypertension   Relevant Medications   olmesartan (BENICAR) 40 MG tablet   hydrochlorothiazide (MICROZIDE) 12.5 MG capsule     Other   Hyperlipidemia with target LDL less than 100   Relevant Medications   olmesartan (BENICAR) 40 MG tablet   hydrochlorothiazide (MICROZIDE) 12.5 MG capsule    Anxiety and depression   Relevant Medications   ALPRAZolam (XANAX) 0.5 MG tablet   Other Visit Diagnoses     Controlled substance agreement signed    -  Primary   Relevant Orders   ToxASSURE Select 13 (MW), Urine   Essential hypertension       Relevant Medications   olmesartan (BENICAR) 40 MG tablet   hydrochlorothiazide (MICROZIDE) 12.5 MG capsule       Continue current medicine, will go see neurologist for twitching. Follow up plan: Return in about 3 months (around 08/27/2022), or if symptoms worsen or fail to improve, for Anxiety recheck.  Counseling  provided for all of the vaccine components Orders Placed This Encounter  Procedures   ToxASSURE Select 13 (MW), Urine    [name], MD [facility] 05/27/2022, 1:31 PM

## 2022-05-27 NOTE — Addendum Note (Signed)
Addended by: [name] on: 05/27/2022 01:34 PM   Modules accepted: Orders

## 2022-05-28 ENCOUNTER — Ambulatory Visit (HOSPITAL_COMMUNITY): Payer: Medicare Other | Admitting: Physician Assistant

## 2022-06-03 DIAGNOSIS — L648 Other androgenic alopecia: Secondary | ICD-10-CM | POA: Diagnosis not present

## 2022-06-03 DIAGNOSIS — L820 Inflamed seborrheic keratosis: Secondary | ICD-10-CM | POA: Diagnosis not present

## 2022-06-03 DIAGNOSIS — L718 Other rosacea: Secondary | ICD-10-CM | POA: Diagnosis not present

## 2022-06-03 LAB — TOXASSURE SELECT 13 (MW), URINE

## 2022-06-23 ENCOUNTER — Telehealth: Payer: Self-pay | Admitting: Pharmacist

## 2022-06-23 NOTE — Telephone Encounter (Signed)
Not urgent Please schedule patient for "pharmacy clinic" for osteoporosis per PCP (not CCM) Telephone is fine or in-person 30 min

## 2022-06-24 NOTE — Telephone Encounter (Signed)
Appt 07-20-22 with Almyra Free

## 2022-06-27 ENCOUNTER — Other Ambulatory Visit: Payer: Self-pay

## 2022-06-27 DIAGNOSIS — I712 Thoracic aortic aneurysm, without rupture, unspecified: Secondary | ICD-10-CM

## 2022-07-01 ENCOUNTER — Other Ambulatory Visit: Payer: Medicare Other

## 2022-07-01 DIAGNOSIS — D509 Iron deficiency anemia, unspecified: Secondary | ICD-10-CM

## 2022-07-01 DIAGNOSIS — I10 Essential (primary) hypertension: Secondary | ICD-10-CM

## 2022-07-01 DIAGNOSIS — E785 Hyperlipidemia, unspecified: Secondary | ICD-10-CM | POA: Diagnosis not present

## 2022-07-01 DIAGNOSIS — I712 Thoracic aortic aneurysm, without rupture, unspecified: Secondary | ICD-10-CM

## 2022-07-01 DIAGNOSIS — E559 Vitamin D deficiency, unspecified: Secondary | ICD-10-CM

## 2022-07-01 DIAGNOSIS — D751 Secondary polycythemia: Secondary | ICD-10-CM

## 2022-07-01 DIAGNOSIS — E538 Deficiency of other specified B group vitamins: Secondary | ICD-10-CM | POA: Diagnosis not present

## 2022-07-01 DIAGNOSIS — R6889 Other general symptoms and signs: Secondary | ICD-10-CM | POA: Diagnosis not present

## 2022-07-01 DIAGNOSIS — F32A Depression, unspecified: Secondary | ICD-10-CM

## 2022-07-02 ENCOUNTER — Ambulatory Visit (HOSPITAL_COMMUNITY)
Admission: RE | Admit: 2022-07-02 | Discharge: 2022-07-02 | Disposition: A | Discharge status: Home / Self Care | Payer: Medicare Other | Source: Ambulatory Visit | Attending: Physician Assistant | Admitting: Physician Assistant

## 2022-07-02 DIAGNOSIS — I712 Thoracic aortic aneurysm, without rupture, unspecified: Secondary | ICD-10-CM | POA: Insufficient documentation

## 2022-07-02 DIAGNOSIS — K449 Diaphragmatic hernia without obstruction or gangrene: Secondary | ICD-10-CM | POA: Diagnosis not present

## 2022-07-02 LAB — CBC WITH DIFFERENTIAL/PLATELET
Basophils Absolute: 0 10*3/uL (ref 0.0–0.2)
Basos: 0 %
EOS (ABSOLUTE): 0.1 10*3/uL (ref 0.0–0.4)
Eos: 2 %
Hematocrit: 45.6 % (ref 34.0–46.6)
Hemoglobin: 15.6 g/dL (ref 11.1–15.9)
Immature Grans (Abs): 0 10*3/uL (ref 0.0–0.1)
Immature Granulocytes: 0 %
Lymphocytes Absolute: 1.7 10*3/uL (ref 0.7–3.1)
Lymphs: 26 %
MCH: 35.3 pg — ABNORMAL HIGH (ref 26.6–33.0)
MCHC: 34.2 g/dL (ref 31.5–35.7)
MCV: 103 fL — ABNORMAL HIGH (ref 79–97)
Monocytes Absolute: 0.4 10*3/uL (ref 0.1–0.9)
Monocytes: 7 %
Neutrophils Absolute: 4.4 10*3/uL (ref 1.4–7.0)
Neutrophils: 65 %
Platelets: 219 10*3/uL (ref 150–450)
RBC: 4.42 x10E6/uL (ref 3.77–5.28)
RDW: 11.7 % (ref 11.7–15.4)
WBC: 6.7 10*3/uL (ref 3.4–10.8)

## 2022-07-02 LAB — IRON AND TIBC
Iron Saturation: 59 % — ABNORMAL HIGH (ref 15–55)
Iron: 161 ug/dL — ABNORMAL HIGH (ref 27–139)
Total Iron Binding Capacity: 275 ug/dL (ref 250–450)
UIBC: 114 ug/dL — ABNORMAL LOW (ref 118–369)

## 2022-07-02 LAB — LIPID PANEL
Chol/HDL Ratio: 2.1 ratio (ref 0.0–4.4)
Cholesterol, Total: 173 mg/dL (ref 100–199)
HDL: 84 mg/dL (ref >39)
LDL Chol Calc (NIH): 67 mg/dL (ref 0–99)
Triglycerides: 133 mg/dL (ref 0–149)
VLDL Cholesterol Cal: 22 mg/dL (ref 5–40)

## 2022-07-02 LAB — FERRITIN: Ferritin: 147 ng/mL (ref 15–150)

## 2022-07-02 MED ORDER — IOHEXOL 350 MG/ML SOLN
75.0000 mL | Freq: Once | INTRAVENOUS | Status: AC | PRN
  Administered 2022-07-02: 75 mL via INTRAVENOUS

## 2022-07-05 LAB — COMPREHENSIVE METABOLIC PANEL
ALT: 34 IU/L — ABNORMAL HIGH (ref 0–32)
AST: 36 IU/L (ref 0–40)
Albumin/Globulin Ratio: 1.5 (ref 1.2–2.2)
Albumin: 4 g/dL (ref 3.9–4.9)
Alkaline Phosphatase: 132 IU/L — ABNORMAL HIGH (ref 44–121)
BUN/Creatinine Ratio: 17 (ref 12–28)
BUN: 13 mg/dL (ref 8–27)
Bilirubin Total: 0.7 mg/dL (ref 0.0–1.2)
CO2: 26 mmol/L (ref 20–29)
Calcium: 9.5 mg/dL (ref 8.7–10.3)
Chloride: 101 mmol/L (ref 96–106)
Creatinine, Ser: 0.76 mg/dL (ref 0.57–1.00)
Globulin, Total: 2.6 g/dL (ref 1.5–4.5)
Glucose: 107 mg/dL — ABNORMAL HIGH (ref 70–99)
Potassium: 4.2 mmol/L (ref 3.5–5.2)
Sodium: 141 mmol/L (ref 134–144)
Total Protein: 6.6 g/dL (ref 6.0–8.5)
eGFR: 84 mL/min/{1.73_m2} (ref >59)

## 2022-07-05 LAB — METHYLMALONIC ACID, SERUM: Methylmalonic Acid: 129 nmol/L (ref 0–378)

## 2022-07-05 LAB — VITAMIN D 25 HYDROXY (VIT D DEFICIENCY, FRACTURES): Vit D, 25-Hydroxy: 43.7 ng/mL (ref 30.0–100.0)

## 2022-07-05 LAB — HOMOCYSTEINE: Homocysteine: 8.7 umol/L (ref 0.0–17.2)

## 2022-07-05 LAB — FOLATE: Folate: 16.1 ng/mL (ref >3.0)

## 2022-07-05 LAB — LACTATE DEHYDROGENASE: LDH: 149 IU/L (ref 119–226)

## 2022-07-05 LAB — VITAMIN B12: Vitamin B-12: 705 pg/mL (ref 232–1245)

## 2022-07-07 NOTE — Progress Notes (Signed)
[address] [place], [place] [id number]   CLINIC:  Medical Oncology/Hematology  PCP:  Dettinger, Fransisca Kaufmann, MD [place] [address] [phone]   REASON FOR VISIT: Follow-up for normocytic anemia  CURRENT THERAPY: Intermittent IV iron; daily F09 and folic acid supplement   INTERVAL HISTORY:  [name] 71 y.o. female is contacted today for follow-up of her normocytic anemia.  She was last evaluated by Tarri Abernethy PA-C via telemedicine visit on 10/30/2021.   At today's visit, she reports that she is doing fair. She denies any recent hospitalization or changes in her baseline health status since her last visit.  She continues to report progressive fatigue for the past several months, with energy at about 50%.     She denies any major bleeding events such as hematemesis, hematochezia, melena, or epistaxis.  She admits to some increased chronic dyspnea on exertion that is unchanged from baseline.  She continues to have occasional lightheaded episodes.  No chest pain or syncope.   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   She has generalized itching of her skin, but denies any aquagenic pruritus.  She has some tingling in her toes intermittently.  She denies any headaches, erythromelalgia or vasomotor symptoms.  She does not take any iron supplementation at home.  She is compliant with her folic acid pills and N23 shots.   She is taking weekly vitamin D.    She has 50% energy and 90% appetite. She endorses that she is maintaining a stable weight.   REVIEW OF SYSTEMS:    Review of Systems  Constitutional:  Positive for fatigue. Negative for appetite change, chills, diaphoresis, fever and unexpected weight change.  HENT:   Negative for lump/mass and nosebleeds.   Eyes:  Negative for eye  problems.  Respiratory:  Positive for cough. Negative for hemoptysis and shortness of breath.   Cardiovascular:  Negative for chest pain, leg swelling and palpitations.  Gastrointestinal:  Negative for abdominal pain, blood in stool, constipation, diarrhea, nausea and vomiting.  Genitourinary:  Negative for hematuria.   Musculoskeletal:  Positive for arthralgias (bilateral ankle pain).  Skin:  Positive for itching.  Neurological:  Positive for dizziness, light-headedness and numbness. Negative for headaches.  Hematological:  Does not bruise/bleed easily.      PAST MEDICAL/SURGICAL HISTORY:  Past Medical History:  Diagnosis Date   Anemia    Anxiety    Aortic atherosclerosis (HCC)    Depression    GERD (gastroesophageal reflux disease)    Hyperlipidemia    Hypertension    Orthostatic hypotension    Osteoporosis    Past Surgical History:  Procedure Laterality Date   ABDOMINAL HYSTERECTOMY     BACK SURGERY     CHOLECYSTECTOMY     FOOT SURGERY     7 in the past   IR GENERIC HISTORICAL  12/09/2016   IR RADIOLOGIST EVAL & MGMT 12/09/2016 [facility]   IR GENERIC HISTORICAL  12/10/2016   IR VERTEBROPLASTY CERV/THOR BX INC UNI/BIL INC/INJECT/IMAGING 12/10/2016 [facility]   LEFT HEART CATH AND CORONARY ANGIOGRAPHY N/A 09/30/2020   Procedure: LEFT HEART CATH AND CORONARY ANGIOGRAPHY;  Surgeon: Nigel Mormon, MD;  Location: [facility];  Service: Cardiovascular;  Laterality: N/A;   TRACHEOSTOMY TUBE PLACEMENT  1982     SOCIAL HISTORY:  Social History  Socioeconomic History   Marital status: Married    Spouse name: Donnie   Number of children: 2   Years of education: 12   Highest education level: Associate degree: occupational, Hotel manager, or vocational program  Occupational History   Occupation: retired    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  Tobacco Use   Smoking status: Former    Packs/day: 0.25    Types:  Cigarettes    Passive exposure: Never   Smokeless tobacco: Never   Tobacco comments:    Trying to cut on number of cigarettes she smokes.  Vaping Use   Vaping Use: Never used  Substance and Sexual Activity   Alcohol use: Not Currently    Comment: occassionally    Drug use: No   Sexual activity: Not on file  Other Topics Concern   Not on file  Social History Narrative   Lives with her husband, Jody Wilson, and has two grown sons and two grand sons.   She visits family and has a once weekly outing with a friend for socializing.  She enjoys making beaded jewelry, reading and walking..   Social Determinants of Health   Financial Resource Strain: Low Risk  (12/09/2021)   Overall Financial Resource Strain (CARDIA)    Difficulty of Paying Living Expenses: Not very hard  Food Insecurity: No Food Insecurity (12/09/2021)   Hunger Vital Sign    Worried About Running Out of Food in the Last Year: Never true    Ran Out of Food in the Last Year: Never true  Transportation Needs: No Transportation Needs (12/09/2021)   PRAPARE - Hydrologist (Medical): No    Lack of Transportation (Non-Medical): No  Physical Activity: Insufficiently Active (12/09/2021)   Exercise Vital Sign    Days of Exercise per Week: 2 days    Minutes of Exercise per Session: 30 min  Stress: No Stress Concern Present (12/09/2021)   [place]    Feeling of Stress : Not at all  Social Connections: Moderately Isolated (12/09/2021)   Social Connection and Isolation Panel [NHANES]    Frequency of Communication with Friends and Family: More than three times a week    Frequency of Social Gatherings with Friends and Family: More than three times a week    Attends Religious Services: Never    Marine scientist or Organizations: No    Attends Archivist Meetings: Never    Marital Status: Married  Human resources officer Violence:  Not At Risk (12/09/2021)   Humiliation, Afraid, Rape, and Kick questionnaire    Fear of Current or Ex-Partner: No    Emotionally Abused: No    Physically Abused: No    Sexually Abused: No    FAMILY HISTORY:  Family History  Problem Relation Age of Onset   Heart disease Mother    Hypertension Mother    Hyperlipidemia Mother    Diabetes Mother    Congestive Heart Failure Mother    Aneurysm Paternal Uncle    Heart attack Maternal Grandmother    Heart attack Maternal Grandfather    Hypertension Son    Other Son 64       Kienbock's Disease   Liver disease Son 58       Gilbert's Disease   Lupus Half-Sister    Atrial fibrillation Half-Sister    Breast cancer Niece     CURRENT MEDICATIONS:  Outpatient Encounter  Medications as of 07/08/2022  Medication Sig   ALPRAZolam (XANAX) 0.5 MG tablet Take 1 tablet (0.5 mg total) by mouth 2 (two) times daily as needed for anxiety.   aspirin EC 81 MG tablet Take 1 tablet (81 mg total) by mouth daily. Swallow whole.   calcium-vitamin D (OSCAL WITH D) 500-200 MG-UNIT tablet Take 1 tablet by mouth daily with lunch.    citalopram (CELEXA) 40 MG tablet Take 1 tablet (40 mg total) by mouth daily.   cyanocobalamin (,VITAMIN B-12,) 1000 MCG/ML injection INJECT 1 ML INTO MUSCLE EVERY 28 DAYS   ergocalciferol (VITAMIN D2) 1.25 MG (50000 UT) capsule Take 1 capsule (50,000 Units total) by mouth once a week.   ezetimibe (ZETIA) 10 MG tablet TAKE 1 TABLET DAILY   folic acid (FOLVITE) 1 MG tablet Take 1 mg by mouth daily.   hydrochlorothiazide (MICROZIDE) 12.5 MG capsule Take 1 capsule (12.5 mg total) by mouth daily.   levETIRAcetam (KEPPRA) 250 MG tablet Take 250 mg by mouth at bedtime.   Multiple Vitamins-Minerals (WOMENS 50+ MULTI VITAMIN/MIN PO) Take 1 tablet by mouth every Monday, Tuesday, Wednesday, Thursday, and Friday.    olmesartan (BENICAR) 40 MG tablet Take 1 tablet (40 mg total) by mouth daily.   pantoprazole (PROTONIX) 40 MG tablet Take 1 tablet  (40 mg total) by mouth daily.   pravastatin (PRAVACHOL) 40 MG tablet Take 1 tablet (40 mg total) by mouth every evening.   VASCEPA 1 g capsule Take 2 capsules (2 g total) by mouth 2 (two) times daily.   No facility-administered encounter medications on file as of 07/08/2022.    ALLERGIES:  Allergies  Allergen Reactions   Tetracyclines & Related Itching and Swelling     PHYSICAL EXAM:    ECOG PERFORMANCE STATUS: 1 - Symptomatic but completely ambulatory  There were no vitals filed for this visit. There were no vitals filed for this visit. Physical Exam Constitutional:      Appearance: Normal appearance.  HENT:     Head: Normocephalic and atraumatic.     Mouth/Throat:     Mouth: Mucous membranes are moist.  Eyes:     Extraocular Movements: Extraocular movements intact.     Pupils: Pupils are equal, round, and reactive to light.  Cardiovascular:     Rate and Rhythm: Regular rhythm. Bradycardia present.     Pulses: Normal pulses.     Heart sounds: Normal heart sounds.     Comments: Heart rate 55 Pulmonary:     Effort: Pulmonary effort is normal.     Breath sounds: Normal breath sounds.  Abdominal:     General: Bowel sounds are normal.     Palpations: Abdomen is soft.     Tenderness: There is no abdominal tenderness.  Musculoskeletal:        General: No swelling.     Right lower leg: No edema.     Left lower leg: No edema.  Lymphadenopathy:     Cervical: No cervical adenopathy.  Skin:    General: Skin is warm and dry.  Neurological:     General: No focal deficit present.     Mental Status: She is alert and oriented to person, place, and time.  Psychiatric:        Mood and Affect: Mood normal.        Behavior: Behavior normal.      LABORATORY DATA:  I have reviewed the labs as listed.  CBC    Component Value Date/Time   WBC  6.7 07/01/2022 1222   WBC 5.2 12/10/2020 1011   RBC 4.42 07/01/2022 1222   RBC 4.42 12/10/2020 1011   HGB 15.6 07/01/2022 1222   HCT  45.6 07/01/2022 1222   PLT 219 07/01/2022 1222   MCV 103 (H) 07/01/2022 1222   MCH 35.3 (H) 07/01/2022 1222   MCH 36.0 (H) 12/10/2020 1011   MCHC 34.2 07/01/2022 1222   MCHC 34.9 12/10/2020 1011   RDW 11.7 07/01/2022 1222   LYMPHSABS 1.7 07/01/2022 1222   MONOABS 0.5 12/10/2020 1011   EOSABS 0.1 07/01/2022 1222   BASOSABS 0.0 07/01/2022 1222      Latest Ref Rng & Units 07/01/2022   12:15 PM 10/22/2021    2:58 PM 07/07/2021    8:48 AM  CMP  Glucose 70 - 99 mg/dL 107  105  97   BUN 8 - 27 mg/dL '13  11  15   '$ Creatinine 0.57 - 1.00 mg/dL 0.76  0.82  0.75   Sodium 134 - 144 mmol/L 141  141  140   Potassium 3.5 - 5.2 mmol/L 4.2  3.6  4.0   Chloride 96 - 106 mmol/L 101  99  101   CO2 20 - 29 mmol/L '26  23  24   '$ Calcium 8.7 - 10.3 mg/dL 9.5  9.9  9.8   Total Protein 6.0 - 8.5 g/dL 6.6  6.8  7.0   Total Bilirubin 0.0 - 1.2 mg/dL 0.7  0.7  0.7   Alkaline Phos 44 - 121 IU/L 132  110  103   AST 0 - 40 IU/L 36  39  54   ALT 0 - 32 IU/L 34  22  26     DIAGNOSTIC IMAGING:  I have independently reviewed the relevant imaging and discussed with the patient.  ASSESSMENT & PLAN: 1.  History of normocytic anemia, RESOLVED - Last colonoscopy (July 2021) was unremarkable - She reports recent EGD and colonoscopy via Dr. Paulita Fujita [facility] physicians in [place]), unable to find report in Epic - She last received Feraheme infusion on 03/17/2020. - Denies any bleeding per rectum or melena.   - She is not taking any oral iron suuplement.   - Most recent labs (07/01/2022): Hgb 15.6, ferritin 147, iron saturation 59 % - PLAN: No IV iron or intervention for anemia needed at this time.     2.  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  No vasomotor symptoms or erythromelalgia. 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  No further work-up or intervention planned at this time. - Repeat CBC in 6 months   3.  Vitamin D deficiency - Low vitamin D noted on labs from 10/22/2021, with vitamin D 14.6 - She is taking vitamin D 50,000 units weekly  - Most recent vitamin D (07/01/2022) normal at 43.7 - PLAN: Continue vitamin D 50,000 units weekly.   4.  Vitamin B12 deficiency: -  Most recent B12 level (10/22/2021): Normal at 680 - She self administers vitamin B12 injections at home once a month  - Most recent labs (07/01/2022): Normal vitamin B12 705, normal methylmalonic acid. - PLAN: Continue B12 injections once a month, patient self administers at home.  Check B12 and methylmalonic acid in 6 months.   5.  Folic acid deficiency: - Most recent folate (07/01/2022) normal at 16.1 with normal homocystine - She is taking folic acid 1 mg daily  - PLAN: Continue folic acid 1 mg daily.  Check folate in 6 months.   6.  Tobacco abuse: - She smoked 1 pack/day for 40 years. - Current smoking status: 0.5 PPD, trying to quit.   - CT lung cancer screening scan on 05/19/2021 was lung RADS 2. - We deferred low-dose CT chest from July 2023, as she received CT angio chest for monitoring of her aorta, no need to duplicate study - CT angio chest (07/02/2022) showed "no pulmonary nodules, masses, or other abnormalities of the lungs/pleura." - PLAN: We will plan to repeat another LDCT scan in July 2024, unless she is scheduled to have another CT angiogram at that time as well.   PLAN SUMMARY & DISPOSITION: Labs in  6 months Phone visit after labs  All questions were answered. The patient knows to call the clinic with any problems, questions or concerns.  Medical decision making: Moderate  Time spent on visit: I spent 20 minutes counseling the patient face to face. The total time spent in the appointment was 30 minutes and more than 50% was on counseling.   Harriett Rush, PA-C  07/08/2022 10:00 AM

## 2022-07-08 ENCOUNTER — Telehealth: Payer: Self-pay | Admitting: Family Medicine

## 2022-07-08 ENCOUNTER — Inpatient Hospital Stay (HOSPITAL_COMMUNITY): Payer: Medicare Other | Attending: Physician Assistant | Admitting: Physician Assistant

## 2022-07-08 VITALS — BP 136/80 | HR 55 | Temp 97.8°F | Resp 18 | Ht 64.57 in | Wt 146.0 lb

## 2022-07-08 DIAGNOSIS — I10 Essential (primary) hypertension: Secondary | ICD-10-CM | POA: Diagnosis not present

## 2022-07-08 DIAGNOSIS — Z9049 Acquired absence of other specified parts of digestive tract: Secondary | ICD-10-CM | POA: Diagnosis not present

## 2022-07-08 DIAGNOSIS — K219 Gastro-esophageal reflux disease without esophagitis: Secondary | ICD-10-CM | POA: Diagnosis not present

## 2022-07-08 DIAGNOSIS — D649 Anemia, unspecified: Secondary | ICD-10-CM | POA: Insufficient documentation

## 2022-07-08 DIAGNOSIS — Z8379 Family history of other diseases of the digestive system: Secondary | ICD-10-CM | POA: Insufficient documentation

## 2022-07-08 DIAGNOSIS — Z833 Family history of diabetes mellitus: Secondary | ICD-10-CM | POA: Diagnosis not present

## 2022-07-08 DIAGNOSIS — R202 Paresthesia of skin: Secondary | ICD-10-CM | POA: Diagnosis not present

## 2022-07-08 DIAGNOSIS — R42 Dizziness and giddiness: Secondary | ICD-10-CM | POA: Insufficient documentation

## 2022-07-08 DIAGNOSIS — F17210 Nicotine dependence, cigarettes, uncomplicated: Secondary | ICD-10-CM | POA: Insufficient documentation

## 2022-07-08 DIAGNOSIS — Z79899 Other long term (current) drug therapy: Secondary | ICD-10-CM | POA: Diagnosis not present

## 2022-07-08 DIAGNOSIS — D509 Iron deficiency anemia, unspecified: Secondary | ICD-10-CM

## 2022-07-08 DIAGNOSIS — D751 Secondary polycythemia: Secondary | ICD-10-CM

## 2022-07-08 DIAGNOSIS — Z8349 Family history of other endocrine, nutritional and metabolic diseases: Secondary | ICD-10-CM | POA: Insufficient documentation

## 2022-07-08 DIAGNOSIS — E538 Deficiency of other specified B group vitamins: Secondary | ICD-10-CM | POA: Diagnosis not present

## 2022-07-08 DIAGNOSIS — Z803 Family history of malignant neoplasm of breast: Secondary | ICD-10-CM | POA: Diagnosis not present

## 2022-07-08 DIAGNOSIS — R0609 Other forms of dyspnea: Secondary | ICD-10-CM | POA: Insufficient documentation

## 2022-07-08 DIAGNOSIS — R5383 Other fatigue: Secondary | ICD-10-CM | POA: Diagnosis not present

## 2022-07-08 DIAGNOSIS — E785 Hyperlipidemia, unspecified: Secondary | ICD-10-CM | POA: Insufficient documentation

## 2022-07-08 DIAGNOSIS — E559 Vitamin D deficiency, unspecified: Secondary | ICD-10-CM | POA: Diagnosis not present

## 2022-07-08 DIAGNOSIS — M2550 Pain in unspecified joint: Secondary | ICD-10-CM | POA: Insufficient documentation

## 2022-07-08 DIAGNOSIS — Z8249 Family history of ischemic heart disease and other diseases of the circulatory system: Secondary | ICD-10-CM | POA: Diagnosis not present

## 2022-07-08 NOTE — Telephone Encounter (Signed)
Patient would like for someone to call and discuss the labs that were done on 7/13

## 2022-07-08 NOTE — Patient Instructions (Signed)
[place] at [facility] Discharge Instructions  You were seen today by Tarri Abernethy PA-C for your history of anemia and your vitamin and mineral deficiencies.  Your blood and vitamin levels are normal at this time. - You do not need any IV iron. - Continue vitamin D 50,000 units weekly - Continue B12 injections once a month - Continue folic acid 1 mg daily  The CT scan of your chest that was performed earlier this month was ordered by the vascular specialist that you saw in 2021, to monitor the abnormality in your aorta.  (I reviewed the results, and they did not show any signs of pulmonary nodules or cancer.)  Please call your vascular specialist to go over these results and to see if you need another follow-up appointment. Dr. Rosetta Posner, MD FACS [facility] [phone]  FOLLOW-UP APPOINTMENT: Labs in 6 months with phone visit the week after labs   - - - - - - - - - - - - - - - - - -    Thank you for choosing [place] at [facility] to provide your oncology and hematology care.  To afford each patient quality time with our provider, please arrive at least 15 minutes before your scheduled appointment time.   If you have a lab appointment with the [facility] please come in thru the Main Entrance and check in at the main information desk.  You need to re-schedule your appointment should you arrive 10 or more minutes late.  We strive to give you quality time with our providers, and arriving late affects you and other patients whose appointments are after yours.  Also, if you no show three or more times for appointments you may be dismissed from the clinic at the providers discretion.     Again, thank you for choosing [facility].  Our hope is that these requests will decrease the amount of time that you wait before being seen by our physicians.        _____________________________________________________________  Should you have questions after your visit to [facility], please contact our office at [phone] and follow the prompts.  Our office hours are 8:00 a.m. and 4:30 p.m. Monday - Friday.  Please note that voicemails left after 4:00 p.m. may not be returned until the following business day.  We are closed weekends and major holidays.  You do have access to a nurse 24-7, just call the main number to the clinic [phone] and do not press any options, hold on the line and a nurse will answer the phone.    For prescription refill requests, have your pharmacy contact our office and allow 72 hours.    Due to Covid, you will need to wear a mask upon entering the hospital. If you do not have a mask, a mask will be given to you at the Main Entrance upon arrival. For doctor visits, patients may have 1 support person age 71 or older with them. For treatment visits, patients can not have anyone with them due to social distancing guidelines and our immunocompromised population.

## 2022-07-08 NOTE — Telephone Encounter (Signed)
Returned patients call about lab result. Answered questions.

## 2022-07-20 ENCOUNTER — Ambulatory Visit: Payer: Medicare Other | Admitting: Pharmacist

## 2022-07-21 ENCOUNTER — Ambulatory Visit: Payer: Medicare Other | Admitting: Vascular Surgery

## 2022-07-21 ENCOUNTER — Encounter: Payer: Self-pay | Admitting: Vascular Surgery

## 2022-07-21 VITALS — BP 127/77 | HR 60 | Temp 98.1°F | Resp 14 | Ht 64.75 in | Wt 145.0 lb

## 2022-07-21 DIAGNOSIS — I712 Thoracic aortic aneurysm, without rupture, unspecified: Secondary | ICD-10-CM

## 2022-07-21 NOTE — Progress Notes (Signed)
[facility] [place]  Patient name: Jody Wilson MRN: [id number] DOB: [birth year] Sex: female  REASON FOR VISIT: Follow-up descending thoracic saccular aneurysm  HPI: Jody Wilson is a 70 y.o. female here today for follow-up.  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  She initially had a screening low-dose CT scan for cancer screening and a smoker.  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  She is here today with repeat CT scan follow-up.  She has no new health issues.  Past Medical History:  Diagnosis Date   Anemia    Anxiety    Aortic atherosclerosis (HCC)    Depression    GERD (gastroesophageal reflux disease)    Hyperlipidemia    Hypertension    Orthostatic hypotension    Osteoporosis     Family History  Problem Relation Age of Onset   Heart disease Mother    Hypertension Mother    Hyperlipidemia Mother    Diabetes Mother    Congestive Heart Failure Mother    Aneurysm Paternal Uncle    Heart attack Maternal Grandmother    Heart attack Maternal Grandfather    Hypertension Son    Other Son 40       Kienbock's Disease   Liver disease Son 60       Gilbert's Disease   Lupus Half-Sister    Atrial fibrillation Half-Sister    Breast cancer Niece     SOCIAL HISTORY: Social History   Tobacco Use   Smoking status: Former    Packs/day: 0.25    Types: Cigarettes    Passive exposure: Never   Smokeless tobacco: Never   Tobacco comments:    Trying to cut on number of cigarettes she smokes.  Substance Use Topics   Alcohol use: Not Currently    Comment: occassionally     Allergies  Allergen Reactions   Tetracyclines & Related Itching and Swelling    Current Outpatient Medications  Medication Sig Dispense Refill   ALPRAZolam  (XANAX) 0.5 MG tablet Take 1 tablet (0.5 mg total) by mouth 2 (two) times daily as needed for anxiety. 60 tablet 2   aspirin EC 81 MG tablet Take 1 tablet (81 mg total) by mouth daily. Swallow whole. 90 tablet 3   calcium-vitamin D (OSCAL WITH D) 500-200 MG-UNIT tablet Take 1 tablet by mouth daily with lunch.      citalopram (CELEXA) 40 MG tablet Take 1 tablet (40 mg total) by mouth daily. 90 tablet 3   clobetasol (TEMOVATE) 0.05 % external solution Apply topically 2 (two) times daily as needed.     cyanocobalamin (,VITAMIN B-12,) 1000 MCG/ML injection INJECT 1 ML INTO MUSCLE EVERY 28 DAYS 1 mL 6   ergocalciferol (VITAMIN D2) 1.25 MG (50000 UT) capsule Take 1 capsule (50,000 Units total) by mouth once a week. 12 capsule 3   ezetimibe (ZETIA) 10 MG tablet TAKE 1 TABLET DAILY 90 tablet 1   folic acid (FOLVITE) 1 MG tablet Take 1 mg by mouth daily.     hydrochlorothiazide (MICROZIDE) 12.5 MG capsule Take 1 capsule (12.5 mg total) by mouth daily. 90 capsule 3   levETIRAcetam (KEPPRA) 250 MG tablet Take 250 mg by mouth at bedtime.     metroNIDAZOLE (METROGEL) 0.75 % gel Apply topically.     Multiple Vitamins-Minerals (WOMENS  50+ MULTI VITAMIN/MIN PO) Take 1 tablet by mouth every Monday, Tuesday, Wednesday, Thursday, and Friday.      olmesartan (BENICAR) 40 MG tablet Take 1 tablet (40 mg total) by mouth daily. 90 tablet 3   pantoprazole (PROTONIX) 40 MG tablet Take 1 tablet (40 mg total) by mouth daily. 90 tablet 3   pravastatin (PRAVACHOL) 40 MG tablet Take 1 tablet (40 mg total) by mouth every evening. 180 tablet 3   VASCEPA 1 g capsule Take 2 capsules (2 g total) by mouth 2 (two) times daily. 360 capsule 3   No current facility-administered medications for this visit.    REVIEW OF SYSTEMS:  '[X]'$  denotes positive finding, '[ ]'$  denotes negative finding Cardiac  Comments:  Chest pain or chest pressure:    Shortness of breath upon exertion:    Short of breath when lying flat:    Irregular heart  rhythm:        Vascular    Pain in calf, thigh, or hip brought on by ambulation:    Pain in feet at night that wakes you up from your sleep:     Blood clot in your veins:    Leg swelling:           PHYSICAL EXAM: Vitals:   07/21/22 1043 07/21/22 1045  BP: (!) 149/84 127/77  Pulse: 60   Resp: 14   Temp: 98.1 F (36.7 C)   TempSrc: Temporal   SpO2: 96%   Weight: 145 lb (65.8 kg)   Height: 5' 4.75" (1.645 m)     GENERAL: The patient is a well-nourished female, in no acute distress. The vital signs are documented above. CARDIOVASCULAR: 2+ radial and 2+ femoral pulses bilaterally.  Abdomen soft with no evidence of aneurysm PULMONARY: There is good air exchange  MUSCULOSKELETAL: There are no major deformities or cyanosis. NEUROLOGIC: No focal weakness or paresthesias are detected. SKIN: There are no ulcers or rashes noted. PSYCHIATRIC: The patient has a normal affect.  DATA:  I reviewed her CT scan and reviewed her actual images with the patient.  This shows no change in the 1 cm wide and 1 cm deep saccular thoracic aneurysm above the level of the diaphragm.  MEDICAL ISSUES: I discussed this with the patient.  Explained that she has had no evidence of change in size.  I would recommend that we see her again in 2 years with a repeat CT scan.  I did explain symptoms of leaking aneurysm and she knows to call 911 immediately should this occur.  Did discuss treatment to include stent graft repair she develop any progression in size    Rosetta Posner, MD FACS [facility] [place] [facility] Tel [phone]  Note: Portions of this report may have been transcribed using voice recognition software.  Every effort has been made to ensure accuracy; however, inadvertent computerized transcription errors may still be present.

## 2022-07-27 ENCOUNTER — Ambulatory Visit: Payer: Medicare Other | Admitting: Pharmacist

## 2022-08-03 ENCOUNTER — Encounter: Payer: Self-pay | Admitting: Neurology

## 2022-08-03 ENCOUNTER — Ambulatory Visit: Payer: Medicare Other | Admitting: Neurology

## 2022-08-03 ENCOUNTER — Telehealth: Payer: Self-pay | Admitting: Neurology

## 2022-08-03 VITALS — BP 129/77 | HR 59 | Ht 65.0 in | Wt 145.8 lb

## 2022-08-03 DIAGNOSIS — R351 Nocturia: Secondary | ICD-10-CM | POA: Diagnosis not present

## 2022-08-03 DIAGNOSIS — E538 Deficiency of other specified B group vitamins: Secondary | ICD-10-CM | POA: Diagnosis not present

## 2022-08-03 DIAGNOSIS — G4719 Other hypersomnia: Secondary | ICD-10-CM | POA: Diagnosis not present

## 2022-08-03 DIAGNOSIS — D508 Other iron deficiency anemias: Secondary | ICD-10-CM

## 2022-08-03 DIAGNOSIS — G253 Myoclonus: Secondary | ICD-10-CM

## 2022-08-03 NOTE — Patient Instructions (Signed)
It was nice to meet you today.  Your involuntary muscle twitching at night may be from your antidepressant medication, namely the citalopram.  As discussed, we will proceed with a sleep study to see if we can capture your muscle twitching.   If you have sleep apnea, I would recommend treatment with a CPAP machine, we will pick up our discussion after testing. We will call you with the results.  Unfortunately, there is probably not a whole lot I can do to help with the twitching at this point.  Please follow-up with your hematologist and primary care on a regular basis.  We will go ahead and do a brain MRI with and without contrast and call you with the results.  Both your brain MRI and sleep study need insurance authorization which we will request.

## 2022-08-03 NOTE — Progress Notes (Signed)
Subjective:    Patient ID: Jody Wilson is a 71 y.o. female.  HPI    Interim history:   I saw patient, Jody Wilson, as a follow-up for muscle twitching at night.  The patient is unaccompanied today.  Jody Wilson is a 71-year-old right-handed woman with an underlying medical history of anemia, anxiety, depression, reflux disease, alcohol use disorder (by chart review), hypertension, hyperlipidemia, orthostatic hypotension, osteoporosis, and B12 deficiency, who had seen Dr. Charles Willis for facial twitching at night. She was seen on 09/26/2019.  I reviewed the note and copied the note below for reference.  She was reporting facial twitching at night.  She had noticed an association with her antidepressant medication.  She was not complaining of leg twitching.  She was advised to stop mirtazapine.  She was advised to treat anemia.  She was seen by Sarah Slack, NP on 9/28/2021t which time she was on Celexa 20 mg daily and her facial twitching had returned.  She was being treated for iron deficiency and B12 deficiency.  She has not followed up in this clinic since then.  Today, 08/03/2022: She reports intermittent twitching at night, including the jaw and whole body, muscle twitching is not like big convulsive shaking.  It is visible through her husband, it happens at night but not every night.  It is primarily in her jaw muscle area.  She has finished iron infusions, had altogether 3 infusions, she gets monthly B12 injections.  She currently is on Celexa 40 mg daily.  She tried Keppra low-dose in 2021, took it off and on for a couple of months, it did not help.  Xanax up to 0.5 mg twice daily does not help.  She goes to bed generally between 11 and midnight.  Rise time is typically between 9 and 10.  She has nocturia about once per average night, no recurrent morning headaches.  She drinks caffeine, 1 cup of coffee per day on average, she smokes three quarters of a pack per day and is trying to quit.   She drinks alcohol up to twice a month.  She has recently been started on a cholesterol medication about a month ago.  She has seen hematology.  She had recent blood work on 07/01/2022 which I was able to review in May.  B12 was 705, MyChart.  B12 level was 705, ferritin 147.  CMP showed BUN of 13, creatinine level 0.76.  Alk phos was mildly elevated at 132, AST 36, ALT 34.  She has not had a brain scan.  She would like to have a brain MRI.  Sometimes she worries about having a brain tumor.  Upon chart review, she did have a brain MRI without contrast on 06/23/2020, which showed:IMPRESSION: 1. No acute intracranial abnormality. 2. 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  Her Epworth sleepiness score is 13 out of 24, fatigue severity score is 56 out of 63.  Twitching gets worse when she is upset or stressed.  The patient's allergies, current medications, family history, past medical history, past social history, past surgical history and problem list were reviewed and updated as appropriate.   Previously (copied from previous notes for reference):   09/16/2020 (Sarah Slack, NP): <<Jody Wilson is a 71-year-old female with history of facial twitching, mostly at nighttime.  This has been chronic, associated with being on antidepressants, when last seen she was taken off mirtazapine, things improved for awhile, but frequency returned.    Is receiving IV iron infusions (3 iron infusions so far), B12 (doing B12 injections at home x 1 month, oral wasn't enough).  MRI of the brain was done in July 2021, ordered by PCP for syncope, unremarkable with exception mild cerebral white matter disease.  She was hospitalized in the summer for syncope, felt to be dehydration related, ETOH level was 180.  She was in the ER 7/23 for unresponsiveness.  Was intoxicated with ETOH, level over 200. Has had 10-15 year history of intermittent jerks of the jaw  and face, mostly at nighttime. TSH normal 1.610. Stopped lorazepam, takes Xanax prescribed 0.5 mg twice daily, only takes in the morning.    Still has facial twitching to the jaw, cheek, rarely during the day, almost always at night, at most 3-4 nights a week, other times goes 2 weeks with nothing, is not painful. Will get up, get on her phone/distract herself, take benadryl , will eventually go away. Indicates, no longer drinking ETOH since last ER visit, she does smoke. In the last year, lost 30 pounds, doesn't eat much, has seen GI had endoscopy/colonoscopy, three polyps removed. Seeing cardiology tomorrow for syncope, wore her monitor, awaiting results. Here today for follow-up accompanied by her son, he had stroke in May, she has a lot of stress worrying about him and her husband. Has been on SSRI since 1995.>>  09/26/2019 (Dr. Jannifer Franklin): <<Jody Wilson is a 71 year old right-handed white female with a 10 to 15-year history of intermittent episodes of jerks of the jaw and face that may occur mainly at nighttime.  In the past, she has noted severe exacerbations of her symptoms while taking Wellbutrin and Zoloft combination.  She has had a baseline frequency in the past of an episode or 2 every month or so, but recently they have become much more frequent, occurring 3-4 times at night.  She has noted that lack of sleep or stress may make the episodes worse.  She currently is on a combination of mirtazapine and Celexa, she cut back on the Celexa dose from 40 mg at day to 20 mg a day because the higher dose of Celexa worsens her symptoms.  She recently was found to have an iron deficiency, she has been craving ice recently.  She also has B12 deficiency, she got her first B12 injection yesterday.  She will be getting IV iron through hematology in the near future.  She will take Benadryl at night which seems to help.  At times, the jaw jerks are severe enough that she may bite her inner cheeks.  Several years ago when  using Zoloft and Wellbutrin combination, the jerks also affected arms and legs, this is not the case currently.  The patient rarely has jerks during the daytime.  She is sent to this office for an evaluation.  She reports no numbness or weakness of the face, arms, legs.  She does note some mild gait instability, but she has not had any falls.  She drinks an occasional tea, she may drink 1 Dr. Malachi Bonds a day.  >>  Her Past Medical History Is Significant For: Past Medical History:  Diagnosis Date   Anemia    Anxiety    Aortic atherosclerosis (HCC)    Depression    GERD (gastroesophageal reflux disease)    Hyperlipidemia    Hypertension    Orthostatic hypotension    Osteoporosis     Her Past Surgical History Is Significant For: Past Surgical History:  Procedure Laterality Date  ABDOMINAL HYSTERECTOMY     BACK SURGERY     CHOLECYSTECTOMY     FOOT SURGERY     7 in the past   IR GENERIC HISTORICAL  12/09/2016   IR RADIOLOGIST EVAL & MGMT 12/09/2016 [facility]   IR GENERIC HISTORICAL  12/10/2016   IR VERTEBROPLASTY CERV/THOR BX INC UNI/BIL INC/INJECT/IMAGING 12/10/2016 [facility]   LEFT HEART CATH AND CORONARY ANGIOGRAPHY N/A 09/30/2020   Procedure: LEFT HEART CATH AND CORONARY ANGIOGRAPHY;  Surgeon: Patwardhan, Manish J, MD;  Location: [facility];  Service: Cardiovascular;  Laterality: N/A;   TRACHEOSTOMY TUBE PLACEMENT  1982    Her Family History Is Significant For: Family History  Problem Relation Age of Onset   Heart disease Mother    Hypertension Mother    Hyperlipidemia Mother    Diabetes Mother    Congestive Heart Failure Mother    Aneurysm Paternal Uncle    Heart attack Maternal Grandmother    Heart attack Maternal Grandfather    Hypertension Son    Other Son 34       Kienbock's Disease   Liver disease Son 34       Gilbert's Disease   Lupus Half-Sister    Atrial fibrillation Half-Sister    Breast cancer Niece     Her Social History Is Significant  For: Social History   Socioeconomic History   Marital status: Married    Spouse name: Donnie   Number of children: 2   Years of education: 12   Highest education level: Associate degree: occupational, technical, or vocational program  Occupational History   Occupation: retired    Comment: [employer] 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  Tobacco Use   Smoking status: Former    Packs/day: 0.25    Types: Cigarettes    Passive exposure: Never   Smokeless tobacco: Never   Tobacco comments:    Trying to cut on number of cigarettes she smokes.  Vaping Use   Vaping Use: Never used  Substance and Sexual Activity   Alcohol use: Not Currently    Comment: occassionally    Drug use: No   Sexual activity: Not on file  Other Topics Concern   Not on file  Social History Narrative   Lives with her husband, Donnie, and has two grown sons and two grand sons.   She visits family and has a once weekly outing with a friend for socializing.  She enjoys making beaded jewelry, reading and walking..   Social Determinants of Health   Financial Resource Strain: Low Risk  (12/09/2021)   Overall Financial Resource Strain (CARDIA)    Difficulty of Paying Living Expenses: Not very hard  Food Insecurity: No Food Insecurity (12/09/2021)   Hunger Vital Sign    Worried About Running Out of Food in the Last Year: Never true    Ran Out of Food in the Last Year: Never true  Transportation Needs: No Transportation Needs (12/09/2021)   PRAPARE - Transportation    Lack of Transportation (Medical): No    Lack of Transportation (Non-Medical): No  Physical Activity: Insufficiently Active (12/09/2021)   Exercise Vital Sign    Days of Exercise per Week: 2 days    Minutes of Exercise per Session: 30 min  Stress: No Stress Concern Present (12/09/2021)   [employer] - Occupational Stress Questionnaire    Feeling of Stress : Not at all  Social Connections:  Moderately Isolated (12/09/2021)     Social Connection and Isolation Panel [NHANES]    Frequency of Communication with Friends and Family: More than three times a week    Frequency of Social Gatherings with Friends and Family: More than three times a week    Attends Religious Services: Never    Active Member of Clubs or Organizations: No    Attends Club or Organization Meetings: Never    Marital Status: Married    Her Allergies Are:  Allergies  Allergen Reactions   Tetracyclines & Related Itching and Swelling  :   Her Current Medications Are:  Outpatient Encounter Medications as of 08/03/2022  Medication Sig   ALPRAZolam (XANAX) 0.5 MG tablet Take 1 tablet (0.5 mg total) by mouth 2 (two) times daily as needed for anxiety.   aspirin EC 81 MG tablet Take 1 tablet (81 mg total) by mouth daily. Swallow whole.   calcium-vitamin D (OSCAL WITH D) 500-200 MG-UNIT tablet Take 1 tablet by mouth daily with lunch.    citalopram (CELEXA) 40 MG tablet Take 1 tablet (40 mg total) by mouth daily.   clobetasol (TEMOVATE) 0.05 % external solution Apply topically 2 (two) times daily as needed.   cyanocobalamin (,VITAMIN B-12,) 1000 MCG/ML injection INJECT 1 ML INTO MUSCLE EVERY 28 DAYS   ergocalciferol (VITAMIN D2) 1.25 MG (50000 UT) capsule Take 1 capsule (50,000 Units total) by mouth once a week.   ezetimibe (ZETIA) 10 MG tablet TAKE 1 TABLET DAILY   folic acid (FOLVITE) 1 MG tablet Take 1 mg by mouth daily.   hydrochlorothiazide (MICROZIDE) 12.5 MG capsule Take 1 capsule (12.5 mg total) by mouth daily.   metroNIDAZOLE (METROGEL) 0.75 % gel Apply topically.   Multiple Vitamins-Minerals (WOMENS 50+ MULTI VITAMIN/MIN PO) Take 1 tablet by mouth every Monday, Tuesday, Wednesday, Thursday, and Friday.    olmesartan (BENICAR) 40 MG tablet Take 1 tablet (40 mg total) by mouth daily. (Patient taking differently: Take 40 mg by mouth daily. Pt takes 20mg in am and 20 mg in PM)   pantoprazole (PROTONIX) 40 MG  tablet Take 1 tablet (40 mg total) by mouth daily.   pravastatin (PRAVACHOL) 40 MG tablet Take 1 tablet (40 mg total) by mouth every evening.   levETIRAcetam (KEPPRA) 250 MG tablet Take 250 mg by mouth at bedtime.   VASCEPA 1 g capsule Take 2 capsules (2 g total) by mouth 2 (two) times daily. (Patient not taking: Reported on 08/03/2022)   No facility-administered encounter medications on file as of 08/03/2022.  :  Review of Systems:  Out of a complete 14 point review of systems, all are reviewed and negative with the exception of these symptoms as listed below:  Review of Systems  Neurological:        Pt here for muscle twitching  Pt states her whole body twitches at night .Pt states she has sharp pains in head. Pt states she has tried xanax and keppra pt states hasn't worked Pt states that both legs are sore hard for her to walk     Objective:  Neurological Exam  Physical Exam Physical Examination:   Vitals:   08/03/22 1057  BP: 129/77  Pulse: (!) 59    General Examination: The patient is a very pleasant 70 y.o. female in no acute distress. She appears well-developed and well-nourished and well groomed.   HEENT: Normocephalic, atraumatic, pupils are equal, round and reactive to light, extraocular tracking is good without limitation to gaze excursion or nystagmus noted. Hearing is grossly intact. Face is   symmetric with normal facial animation. Speech is clear with no dysarthria noted. There is no hypophonia. There is no lip, neck/head, jaw or voice tremor. Neck is supple with full range of passive and active motion. There are no carotid bruits on auscultation. Oropharynx exam reveals: moderate mouth dryness, adequate dental hygiene mild airway crowding secondary to small airway entry and redundant soft palate.  Mallampati class II.  Tonsils absent.  Tongue protrudes centrally and palate elevates symmetrically.  No abnormal involuntary tongue movements or mouth movements.  No twitching in  the jaw muscles.  Chest: Clear to auscultation without wheezing, rhonchi or crackles noted.  Heart: S1+S2+0, regular and normal without murmurs, rubs or gallops noted.   Abdomen: Soft, non-tender and non-distended.  Extremities: There is no pitting edema in the distal lower extremities bilaterally.   Skin: Warm and dry with mild chronic appearing discoloration in the distal lower extremities and feet.    Musculoskeletal: exam reveals no obvious joint deformities.   Neurologically:  Mental status: The patient is awake, alert and oriented in all 4 spheres. Her immediate and remote memory, attention, language skills and fund of knowledge are appropriate. There is no evidence of aphasia, agnosia, apraxia or anomia. Speech is clear with normal prosody and enunciation. Thought process is linear. Mood is normal and affect is normal.  Cranial nerves II - XII are as described above under HEENT exam.  Motor exam: Normal bulk, strength and tone is noted. There is no resting or postural or action tremor.  No myoclonus noted. Reflexes are 2+ throughout, toes are downgoing bilaterally. Fine motor skills and coordination: Normal finger taps and hand movements, normal rapid alternating padding, normal foot taps.    Cerebellar testing: No dysmetria or intention tremor. There is no truncal or gait ataxia.  Sensory exam: intact to light touch in the upper and lower extremities.  Gait, station and balance: She stands without difficulty.  She walks without difficulty and no walking aid.  Tandem walk is difficult for her.   Assessment and Plan:  In summary, Jody Wilson is a very pleasant 71 y.o.-year old female with an underlying medical history of anemia, anxiety, depression, reflux disease, alcohol use disorder (by chart review), hypertension, hyperlipidemia, orthostatic hypotension, osteoporosis, and B12 deficiency, who presents for follow-up consultation of nocturnal muscle twitching.  She is advised that  most likely her muscle twitching stems from taking an antidepressant medication, particularly an SSRI such as her Celexa.  She has tried Xanax and low-dose Keppra, both without sustained effect.  She is advised to proceed with a sleep study to see if we can demonstrate her muscle twitching.  Unfortunately, there is not a whole lot we can do to help, she is not endorsing telltale symptoms of restless leg syndrome at this time, sometimes she has stinging sensation in her feet which comes and goes.  She has not been on Keppra for about 2 years.  Took it off and on for about 2 months in 2021.  She is advised to continue to work on smoking cessation and continue follow-up with her hematologist and primary care.  We will call her to set up her sleep study.  Furthermore, we can go ahead and do a brain MRI with and without contrast.  She had a noncontrasted brain MRI in 2021 which did not show any structural changes.  She is advised that we will call her to schedule her brain MRI and also with the results.  If she has underlying sleep  apnea, she is advised that I would likely recommend treatment with a CPAP or AutoPap machine.  We will plan a follow-up accordingly.  I answered all her questions today and she was in agreement. ' I spent 45 minutes in total face-to-face time and in reviewing records during pre-charting, more than 50% of which was spent in counseling and coordination of care, reviewing test results, reviewing medications and treatment regimen and/or in discussing or reviewing the diagnosis of involuntary twitching at night, the prognosis and treatment options. Pertinent laboratory and imaging test results that were available during this visit with the patient were reviewed by me and considered in my medical decision making (see chart for details).

## 2022-08-03 NOTE — Telephone Encounter (Signed)
[employer] medicare NPR sent to [facility] 

## 2022-08-18 ENCOUNTER — Telehealth: Payer: Self-pay | Admitting: Neurology

## 2022-08-18 NOTE — Telephone Encounter (Signed)
sent fax to [facility] scheduling to schedule at [facility] per patient request

## 2022-08-18 NOTE — Telephone Encounter (Signed)
[employer] medicare no auth req.  Patient is scheduled at [facility] for 09/10/22 at 8 pm.  Mailed packet to the patient.

## 2022-08-20 ENCOUNTER — Other Ambulatory Visit (HOSPITAL_COMMUNITY): Payer: Self-pay | Admitting: Gastroenterology

## 2022-08-20 ENCOUNTER — Other Ambulatory Visit (HOSPITAL_COMMUNITY): Payer: Self-pay

## 2022-08-20 ENCOUNTER — Other Ambulatory Visit: Payer: Self-pay | Admitting: Family Medicine

## 2022-08-20 DIAGNOSIS — R109 Unspecified abdominal pain: Secondary | ICD-10-CM

## 2022-08-20 DIAGNOSIS — Z1231 Encounter for screening mammogram for malignant neoplasm of breast: Secondary | ICD-10-CM

## 2022-08-25 ENCOUNTER — Ambulatory Visit (INDEPENDENT_AMBULATORY_CARE_PROVIDER_SITE_OTHER): Payer: Medicare Other | Admitting: Nurse Practitioner

## 2022-08-25 ENCOUNTER — Ambulatory Visit
Admission: RE | Admit: 2022-08-25 | Discharge: 2022-08-25 | Disposition: A | Discharge status: Home / Self Care | Payer: Medicare Other | Source: Ambulatory Visit | Attending: Family Medicine | Admitting: Family Medicine

## 2022-08-25 ENCOUNTER — Encounter: Payer: Self-pay | Admitting: Nurse Practitioner

## 2022-08-25 VITALS — BP 148/98 | HR 63 | Temp 98.7°F | Ht 65.0 in | Wt 147.4 lb

## 2022-08-25 DIAGNOSIS — L089 Local infection of the skin and subcutaneous tissue, unspecified: Secondary | ICD-10-CM | POA: Diagnosis not present

## 2022-08-25 DIAGNOSIS — Z1231 Encounter for screening mammogram for malignant neoplasm of breast: Secondary | ICD-10-CM

## 2022-08-25 MED ORDER — CEPHALEXIN 500 MG PO CAPS: 500.0000 mg | ORAL_CAPSULE | Freq: Two times a day (BID) | ORAL | 0 refills | Status: DC

## 2022-08-25 NOTE — Progress Notes (Signed)
Acute Office Visit  Subjective:     Patient ID: Jody Wilson, female    DOB: 10-25-1951, 71 y.o.   MRN: [id number]  Chief Complaint  Patient presents with   Insect Bite    Rash This is a new problem. Episode onset: 3 days ago. The problem has been gradually improving since onset. Location: buttocks. The rash is characterized by blistering, redness and itchiness. She was exposed to an insect bite/sting. Pertinent negatives include no congestion, cough, fever, shortness of breath or sore throat. Treatments tried: Mupirocin.     Review of Systems  Constitutional: Negative.  Negative for chills and fever.  HENT: Negative.  Negative for congestion, sinus pain and sore throat.   Eyes: Negative.   Respiratory:  Negative for cough and shortness of breath.   Cardiovascular: Negative.   Gastrointestinal: Negative.   Genitourinary: Negative.   Skin:  Positive for rash.  All other systems reviewed and are negative.       Objective:    BP (!) 148/98   Pulse 63   Temp 98.7 F (37.1 C)   Ht '5\' 5"'$  (1.651 m)   Wt 147 lb 6.4 oz (66.9 kg)   SpO2 94%   BMI 24.53 kg/m  BP Readings from Last 3 Encounters:  08/25/22 (!) 148/98  08/03/22 129/77  07/21/22 127/77   Wt Readings from Last 3 Encounters:  08/25/22 147 lb 6.4 oz (66.9 kg)  08/03/22 145 lb 12.8 oz (66.1 kg)  07/21/22 145 lb (65.8 kg)      Physical Exam Vitals and nursing note reviewed.  Constitutional:      Appearance: Normal appearance.  HENT:     Head: Normocephalic.     Right Ear: External ear normal.     Left Ear: External ear normal.     Nose: Nose normal. No congestion.     Mouth/Throat:     Mouth: Mucous membranes are moist.     Pharynx: Oropharynx is clear.  Eyes:     Conjunctiva/sclera: Conjunctivae normal.  Cardiovascular:     Rate and Rhythm: Normal rate and regular rhythm.     Pulses: Normal pulses.     Heart sounds: Normal heart sounds.  Pulmonary:     Effort: Pulmonary effort is normal.      Breath sounds: Normal breath sounds.  Skin:    Findings: Rash present. Rash is vesicular.          Comments: Blistering rash  Neurological:     Mental Status: She is alert.     No results found for any visits on 08/25/22.      Assessment & Plan:  Patient presents with blistering rash from insect bite, symptoms present since Thursday.  Patient denies fever, body aches, cough or shortness of breath.  Symptoms are gradually resolving.  Slight skin infection assessed.  Started patient on Keflex 500 mg tablet by mouth twice daily for 5 days.  Cool compress as tolerated. Avoid itching with fingernails.  Keep skin clean and moisturized. Follow-up with worsening unresolved symptoms. Problem List Items Addressed This Visit   None Visit Diagnoses     Skin infection    -  Primary   Relevant Medications   cephALEXin (KEFLEX) 500 MG capsule       Meds ordered this encounter  Medications   cephALEXin (KEFLEX) 500 MG capsule    Sig: Take 1 capsule (500 mg total) by mouth 2 (two) times daily.    Dispense:  10 capsule  Refill:  0    Order Specific Question:   Supervising Provider    Answer:   Claretta Fraise [[id number]]    Return if symptoms worsen or fail to improve.  Ivy Lynn, NP

## 2022-08-30 ENCOUNTER — Encounter: Payer: Self-pay | Admitting: Family Medicine

## 2022-08-30 ENCOUNTER — Ambulatory Visit (INDEPENDENT_AMBULATORY_CARE_PROVIDER_SITE_OTHER): Payer: Medicare Other | Admitting: Family Medicine

## 2022-08-30 VITALS — BP 133/80 | HR 60 | Temp 98.2°F | Ht 65.0 in | Wt 144.0 lb

## 2022-08-30 DIAGNOSIS — F32A Depression, unspecified: Secondary | ICD-10-CM

## 2022-08-30 DIAGNOSIS — F419 Anxiety disorder, unspecified: Secondary | ICD-10-CM

## 2022-08-30 DIAGNOSIS — Z23 Encounter for immunization: Secondary | ICD-10-CM

## 2022-08-30 MED ORDER — ALPRAZOLAM 0.5 MG PO TABS: 0.5000 mg | ORAL_TABLET | Freq: Two times a day (BID) | ORAL | 2 refills | Status: DC | PRN

## 2022-08-30 NOTE — Progress Notes (Signed)
BP 133/80   Pulse 60   Temp 98.2 F (36.8 C)   Ht '5\' 5"'$  (1.651 m)   Wt 144 lb (65.3 kg)   SpO2 99%   BMI 23.96 kg/m    Subjective:   Patient ID: Jody Wilson, female    DOB: Oct 24, 1951, 71 y.o.   MRN: [id number]  HPI: Jody Wilson is a 71 y.o. female presenting on 08/30/2022 for Medical Management of Chronic Issues and Anxiety   HPI Anxiety and depression recheck Current rx-alprazolam 0.5 mg twice daily as needed and citalopram # meds rx-60/month of alprazolam Effectiveness of current meds-works well Adverse reactions form meds-none  Pill count performed-No Last drug screen -06/04/2022 ( high risk q91m moderate risk q652mlow risk yearly ) Urine drug screen today- No Was the [place]yes  If yes were their any concerning findings? -None  No flowsheet data found.   Controlled substance contract signed on: 06/04/2022  Relevant past medical, surgical, family and social history reviewed and updated as indicated. Interim medical history since our last visit reviewed. Allergies and medications reviewed and updated.  Review of Systems  Constitutional:  Negative for chills and fever.  Eyes:  Negative for visual disturbance.  Respiratory:  Negative for chest tightness and shortness of breath.   Cardiovascular:  Negative for chest pain and leg swelling.  Musculoskeletal:  Negative for back pain and gait problem.  Skin:  Negative for rash.  Neurological:  Negative for light-headedness and headaches.  Psychiatric/Behavioral:  Negative for agitation and behavioral problems.   All other systems reviewed and are negative.   Per HPI unless specifically indicated above   Allergies as of 08/30/2022       Reactions   Tetracyclines & Related Itching, Swelling        Medication List        Accurate as of August 30, 2022  1:59 PM. If you have any questions, ask your nurse or doctor.          STOP taking these medications    cephALEXin 500 MG  capsule Commonly known as: Keflex Stopped by: JoFransisca Kaufmannettinger, MD   Vascepa 1 g capsule Generic drug: icosapent Ethyl Stopped by: JoFransisca Kaufmannettinger, MD       TAKE these medications    ALPRAZolam 0.5 MG tablet Commonly known as: XANAX Take 1 tablet (0.5 mg total) by mouth 2 (two) times daily as needed for anxiety.   aspirin EC 81 MG tablet Take 1 tablet (81 mg total) by mouth daily. Swallow whole.   calcium-vitamin D 500-200 MG-UNIT tablet Commonly known as: OSCAL WITH D Take 1 tablet by mouth daily with lunch.   citalopram 40 MG tablet Commonly known as: CELEXA Take 1 tablet (40 mg total) by mouth daily.   clobetasol 0.05 % external solution Commonly known as: TEMOVATE Apply topically 2 (two) times daily as needed.   cyanocobalamin 1000 MCG/ML injection Commonly known as: VITAMIN B12 INJECT 1 ML INTO MUSCLE EVERY 28 DAYS   ergocalciferol 1.25 MG (50000 UT) capsule Commonly known as: VITAMIN D2 Take 1 capsule (50,000 Units total) by mouth once a week.   ezetimibe 10 MG tablet Commonly known as: ZETIA TAKE 1 TABLET DAILY   folic acid 1 MG tablet Commonly known as: FOLVITE Take 1 mg by mouth daily.   hydrochlorothiazide 12.5 MG capsule Commonly known as: MICROZIDE Take 1 capsule (12.5 mg total) by mouth daily.   levETIRAcetam 250 MG tablet Commonly known as: KEPPRA Take  250 mg by mouth at bedtime.   metroNIDAZOLE 0.75 % gel Commonly known as: METROGEL Apply topically.   olmesartan 40 MG tablet Commonly known as: BENICAR Take 1 tablet (40 mg total) by mouth daily. What changed: additional instructions   pantoprazole 40 MG tablet Commonly known as: PROTONIX Take 1 tablet (40 mg total) by mouth daily.   pravastatin 40 MG tablet Commonly known as: PRAVACHOL Take 1 tablet (40 mg total) by mouth every evening.   WOMENS 50+ MULTI VITAMIN/MIN PO Take 1 tablet by mouth every Monday, Tuesday, Wednesday, Thursday, and Friday.         Objective:    BP 133/80   Pulse 60   Temp 98.2 F (36.8 C)   Ht '5\' 5"'$  (1.651 m)   Wt 144 lb (65.3 kg)   SpO2 99%   BMI 23.96 kg/m   Wt Readings from Last 3 Encounters:  08/30/22 144 lb (65.3 kg)  08/25/22 147 lb 6.4 oz (66.9 kg)  08/03/22 145 lb 12.8 oz (66.1 kg)    Physical Exam Vitals and nursing note reviewed.  Constitutional:      General: She is not in acute distress.    Appearance: She is well-developed. She is not diaphoretic.  Eyes:     Conjunctiva/sclera: Conjunctivae normal.  Cardiovascular:     Rate and Rhythm: Normal rate and regular rhythm.     Heart sounds: Normal heart sounds. No murmur heard. Pulmonary:     Effort: Pulmonary effort is normal. No respiratory distress.     Breath sounds: Normal breath sounds. No wheezing.  Musculoskeletal:        General: No tenderness. Normal range of motion.  Skin:    General: Skin is warm and dry.     Findings: No rash.  Neurological:     Mental Status: She is alert and oriented to person, place, and time.     Coordination: Coordination normal.  Psychiatric:        Behavior: Behavior normal.       Assessment & Plan:   Problem List Items Addressed This Visit       Other   Anxiety and depression   Relevant Medications   ALPRAZolam (XANAX) 0.5 MG tablet   Other Visit Diagnoses     Need for Tdap vaccination    -  Primary   Relevant Orders   Tdap vaccine greater than or equal to 7yo IM (Completed)       Continue current medicine, seems to be doing well. Follow up plan: Return in about 3 months (around 11/29/2022), or if symptoms worsen or fail to improve, for Anxiety depression and blood work.  Counseling provided for all of the vaccine components Orders Placed This Encounter  Procedures   Tdap vaccine greater than or equal to 60yo IM    [name], MD [facility] 08/30/2022, 1:59 PM

## 2022-08-31 ENCOUNTER — Telehealth: Payer: Self-pay | Admitting: Family Medicine

## 2022-09-01 NOTE — Telephone Encounter (Signed)
Called and notified patient of mammo results in chart.

## 2022-09-07 ENCOUNTER — Ambulatory Visit (HOSPITAL_COMMUNITY)
Admission: RE | Admit: 2022-09-07 | Discharge: 2022-09-07 | Disposition: A | Discharge status: Home / Self Care | Payer: Medicare Other | Source: Ambulatory Visit | Attending: Neurology | Admitting: Neurology

## 2022-09-07 DIAGNOSIS — G253 Myoclonus: Secondary | ICD-10-CM | POA: Insufficient documentation

## 2022-09-07 DIAGNOSIS — R351 Nocturia: Secondary | ICD-10-CM | POA: Insufficient documentation

## 2022-09-07 DIAGNOSIS — G4719 Other hypersomnia: Secondary | ICD-10-CM | POA: Insufficient documentation

## 2022-09-07 DIAGNOSIS — R29818 Other symptoms and signs involving the nervous system: Secondary | ICD-10-CM | POA: Diagnosis not present

## 2022-09-07 DIAGNOSIS — E538 Deficiency of other specified B group vitamins: Secondary | ICD-10-CM | POA: Insufficient documentation

## 2022-09-07 DIAGNOSIS — D508 Other iron deficiency anemias: Secondary | ICD-10-CM | POA: Diagnosis not present

## 2022-09-07 MED ORDER — GADOPICLENOL 0.5 MMOL/ML IV SOLN
7.0000 mL | Freq: Once | INTRAVENOUS | Status: AC | PRN
  Administered 2022-09-07: 7 mL via INTRAVENOUS

## 2022-09-08 ENCOUNTER — Ambulatory Visit: Payer: Medicare Other | Admitting: Vascular Surgery

## 2022-09-13 NOTE — Telephone Encounter (Signed)
The 09/10/22 sleep study was cancel due to tech out sick.  She is r/s for 09/27/22 at 8 pm. I mailed new packet to the patient.

## 2022-09-15 ENCOUNTER — Ambulatory Visit: Payer: Medicare Other | Admitting: Vascular Surgery

## 2022-10-08 ENCOUNTER — Other Ambulatory Visit: Payer: Self-pay | Admitting: Cardiology

## 2022-10-08 DIAGNOSIS — E782 Mixed hyperlipidemia: Secondary | ICD-10-CM

## 2022-10-08 DIAGNOSIS — I6521 Occlusion and stenosis of right carotid artery: Secondary | ICD-10-CM

## 2022-10-11 ENCOUNTER — Telehealth: Payer: Self-pay

## 2022-10-11 NOTE — Telephone Encounter (Addendum)
Called pt to see if she was coming to her sleep study as it was already 15 mins after appt time. Pt said she called last Friday 10/20 and told someone she needed to cancel upcoming sleep study due to having the flu. She said she was informed appt was cancelled and someone from the sleep lab would reach out to her to r/s.   Pt would like a call back to r/s sleep study. I did cancel the appt. Per sleep lab manager the pt will not be charged no show fee since she is sick.

## 2022-10-13 NOTE — Telephone Encounter (Signed)
LVM for pt to call back to see if she wanted to r/s her SS.

## 2022-10-16 ENCOUNTER — Emergency Department (HOSPITAL_COMMUNITY)
Admission: EM | Admit: 2022-10-16 | Discharge: 2022-10-16 | Disposition: A | Discharge status: Home / Self Care | Payer: Medicare Other | Attending: Emergency Medicine | Admitting: Emergency Medicine

## 2022-10-16 ENCOUNTER — Encounter (HOSPITAL_COMMUNITY): Payer: Self-pay

## 2022-10-16 ENCOUNTER — Emergency Department (HOSPITAL_COMMUNITY): Payer: Medicare Other

## 2022-10-16 ENCOUNTER — Other Ambulatory Visit: Payer: Self-pay

## 2022-10-16 DIAGNOSIS — W1839XA Other fall on same level, initial encounter: Secondary | ICD-10-CM | POA: Diagnosis not present

## 2022-10-16 DIAGNOSIS — Z79899 Other long term (current) drug therapy: Secondary | ICD-10-CM | POA: Diagnosis not present

## 2022-10-16 DIAGNOSIS — R0789 Other chest pain: Secondary | ICD-10-CM | POA: Diagnosis not present

## 2022-10-16 DIAGNOSIS — I10 Essential (primary) hypertension: Secondary | ICD-10-CM | POA: Diagnosis not present

## 2022-10-16 DIAGNOSIS — W19XXXA Unspecified fall, initial encounter: Secondary | ICD-10-CM

## 2022-10-16 DIAGNOSIS — K449 Diaphragmatic hernia without obstruction or gangrene: Secondary | ICD-10-CM | POA: Diagnosis not present

## 2022-10-16 DIAGNOSIS — Z7982 Long term (current) use of aspirin: Secondary | ICD-10-CM | POA: Insufficient documentation

## 2022-10-16 DIAGNOSIS — S2222XA Fracture of body of sternum, initial encounter for closed fracture: Secondary | ICD-10-CM | POA: Diagnosis not present

## 2022-10-16 DIAGNOSIS — W228XXA Striking against or struck by other objects, initial encounter: Secondary | ICD-10-CM | POA: Diagnosis not present

## 2022-10-16 DIAGNOSIS — R079 Chest pain, unspecified: Secondary | ICD-10-CM | POA: Diagnosis present

## 2022-10-16 MED ORDER — HYDROCODONE-ACETAMINOPHEN 5-325 MG PO TABS: 1.0000 | ORAL_TABLET | Freq: Four times a day (QID) | ORAL | 0 refills | Status: DC | PRN

## 2022-10-16 MED ORDER — HYDROMORPHONE HCL 1 MG/ML IJ SOLN
0.5000 mg | Freq: Once | INTRAMUSCULAR | Status: AC
  Administered 2022-10-16: 0.5 mg via INTRAMUSCULAR
  Filled 2022-10-16: qty 0.5

## 2022-10-16 NOTE — ED Notes (Addendum)
Call light within reach.

## 2022-10-16 NOTE — Discharge Instructions (Signed)
Take the Vicodin for pain if Tylenol does not work by itself.  Follow-up with your family doctor if not improving

## 2022-10-16 NOTE — ED Provider Notes (Signed)
[facility] EMERGENCY DEPARTMENT Provider Note   CSN: [id number] Arrival date & time: 10/16/22  1801     History {Add pertinent medical, surgical, social history, OB history to HPI:1} Chief Complaint  Patient presents with   Jody Wilson is a 71 y.o. female.  Patient has a history of hypertension.  She fell a few days ago and hit her chest.  Patient complains of pain over her sternum   Fall       Home Medications Prior to Admission medications   Medication Sig Start Date End Date Taking? Authorizing Provider  HYDROcodone-acetaminophen (NORCO/VICODIN) 5-325 MG tablet Take 1 tablet by mouth every 6 (six) hours as needed. 10/16/22  Yes Milton Ferguson, MD  ALPRAZolam Duanne Moron) 0.5 MG tablet Take 1 tablet (0.5 mg total) by mouth 2 (two) times daily as needed for anxiety. 08/30/22   Dettinger, Fransisca Kaufmann, MD  aspirin EC 81 MG tablet Take 1 tablet (81 mg total) by mouth daily. Swallow whole. 07/29/20   Tolia, Sunit, DO  calcium-vitamin D (OSCAL WITH D) 500-200 MG-UNIT tablet Take 1 tablet by mouth daily with lunch.     [provider]  citalopram (CELEXA) 40 MG tablet Take 1 tablet (40 mg total) by mouth daily. 10/22/21   Dettinger, Fransisca Kaufmann, MD  clobetasol (TEMOVATE) 0.05 % external solution Apply topically 2 (two) times daily as needed. 06/04/22   [provider]  cyanocobalamin (,VITAMIN B-12,) 1000 MCG/ML injection INJECT 1 ML INTO MUSCLE EVERY 28 DAYS 02/14/22   Tarri Abernethy M, PA-C  ergocalciferol (VITAMIN D2) 1.25 MG (50000 UT) capsule Take 1 capsule (50,000 Units total) by mouth once a week. 10/30/21   Harriett Rush, PA-C  ezetimibe (ZETIA) 10 MG tablet TAKE 1 TABLET DAILY 10/08/22   Tolia, Sunit, DO  folic acid (FOLVITE) 1 MG tablet Take 1 mg by mouth daily.    [provider]  hydrochlorothiazide (MICROZIDE) 12.5 MG capsule Take 1 capsule (12.5 mg total) by mouth daily. 05/27/22   Dettinger, Fransisca Kaufmann, MD  levETIRAcetam (KEPPRA) 250  MG tablet Take 250 mg by mouth at bedtime.    [provider]  metroNIDAZOLE (METROGEL) 0.75 % gel Apply topically. 06/04/22   [provider]  Multiple Vitamins-Minerals (WOMENS 50+ [place] VITAMIN/MIN PO) Take 1 tablet by mouth every Monday, Tuesday, Wednesday, Thursday, and Friday.     [provider]  olmesartan (BENICAR) 40 MG tablet Take 1 tablet (40 mg total) by mouth daily. Patient taking differently: Take 40 mg by mouth daily. Pt takes '20mg'$  in am and 20 mg in PM 05/27/22   Dettinger, Fransisca Kaufmann, MD  pantoprazole (PROTONIX) 40 MG tablet Take 1 tablet (40 mg total) by mouth daily. 05/27/22   Dettinger, Fransisca Kaufmann, MD  pravastatin (PRAVACHOL) 40 MG tablet Take 1 tablet (40 mg total) by mouth every evening. 01/22/22   Dettinger, Fransisca Kaufmann, MD      Allergies    Tetracyclines & related    Review of Systems   Review of Systems  Physical Exam Updated Vital Signs BP (!) 154/99   Pulse 61   Temp 98.2 F (36.8 C) (Oral)   Resp 18   Ht '5\' 5"'$  (1.651 m)   Wt 64.4 kg   SpO2 97%   BMI 23.63 kg/m  Physical Exam  ED Results / Procedures / Treatments   Labs (all labs ordered are listed, but only abnormal results are displayed) Labs Reviewed - No data to display  EKG None  Radiology DG Chest 2 View  Result Date: 10/16/2022 CLINICAL DATA:  Fall. EXAM: CHEST - 2 VIEW COMPARISON:  Chest CT 06/02/2022 FINDINGS: Large hiatal hernia is again seen. The cardiomediastinal silhouette is within normal limits. Lungs are clear. No pleural effusion or pneumothorax. Vertebroplasty changes of the lower thoracic vertebral body appears unchanged. No acute fractures are seen. Surgical clips are noted in the upper abdomen. Cervical spinal fusion plate is again noted. IMPRESSION: 1. No active cardiopulmonary disease. 2. Large hiatal hernia. Electronically Signed   By: Ronney Asters M.D.   On: 10/16/2022 18:58    Procedures Procedures  {Document cardiac monitor, telemetry assessment  procedure when appropriate:1}  Medications Ordered in ED Medications  HYDROmorphone (DILAUDID) injection 0.5 mg (0.5 mg Intramuscular Given 10/16/22 1847)    ED Course/ Medical Decision Making/ A&P                           Medical Decision Making Amount and/or Complexity of Data Reviewed Radiology: ordered.  Risk Prescription drug management.   Patient with contusion to chest.  X-ray negative.  She will be placed on Vicodin as needed will follow-up with PCP  {Document critical care time when appropriate:1} {Document review of labs and clinical decision tools ie heart score, Chads2Vasc2 etc:1}  {Document your independent review of radiology images, and any outside records:1} {Document your discussion with family members, caretakers, and with consultants:1} {Document social determinants of health affecting pt's care:1} {Document your decision making why or why not admission, treatments were needed:1} Final Clinical Impression(s) / ED Diagnoses Final diagnoses:  Fall, initial encounter    Rx / DC Orders ED Discharge Orders          Ordered    HYDROcodone-acetaminophen (NORCO/VICODIN) 5-325 MG tablet  Every 6 hours PRN        10/16/22 1958

## 2022-10-16 NOTE — ED Triage Notes (Signed)
Patient went to UC due to a fall on the bathtub Wednesday. Patient seen at [facility] today and chest x-ray showed acute sternal fx. Patient was given give '800mg'$  ibuprofen and told to come to the ED.

## 2022-10-25 ENCOUNTER — Encounter (HOSPITAL_COMMUNITY): Payer: Self-pay | Admitting: Hematology

## 2022-10-29 ENCOUNTER — Telehealth: Payer: Self-pay | Admitting: Neurology

## 2022-10-29 NOTE — Telephone Encounter (Signed)
Pt is calling. Stated she would like a nurse to giver her a call so she can explain why she haven't schedule Sleep Study.

## 2022-11-01 ENCOUNTER — Other Ambulatory Visit: Payer: Medicare Other

## 2022-11-01 NOTE — Telephone Encounter (Signed)
Noted, thank you

## 2022-11-01 NOTE — Telephone Encounter (Signed)
Spoke with patient. She states the reason why she hasn't been able to r/s her SS is because she fell and broke her sternum. She states she can't lie flat and can't do the study yet, but she will as soon as she can. I wished her a speedy recovery and thanked her for the update. She will be in touch.

## 2022-11-02 ENCOUNTER — Encounter: Payer: Self-pay | Admitting: Nurse Practitioner

## 2022-11-02 ENCOUNTER — Ambulatory Visit (INDEPENDENT_AMBULATORY_CARE_PROVIDER_SITE_OTHER): Payer: Medicare Other

## 2022-11-02 ENCOUNTER — Ambulatory Visit (INDEPENDENT_AMBULATORY_CARE_PROVIDER_SITE_OTHER): Payer: Medicare Other | Admitting: Nurse Practitioner

## 2022-11-02 VITALS — BP 121/81 | HR 58 | Temp 97.3°F | Resp 20 | Ht 65.0 in | Wt 146.0 lb

## 2022-11-02 DIAGNOSIS — S2222XA Fracture of body of sternum, initial encounter for closed fracture: Secondary | ICD-10-CM | POA: Diagnosis not present

## 2022-11-02 DIAGNOSIS — R0789 Other chest pain: Secondary | ICD-10-CM

## 2022-11-02 DIAGNOSIS — S2220XA Unspecified fracture of sternum, initial encounter for closed fracture: Secondary | ICD-10-CM | POA: Diagnosis not present

## 2022-11-02 MED ORDER — HYDROCODONE-ACETAMINOPHEN 10-325 MG PO TABS: 1.0000 | ORAL_TABLET | Freq: Three times a day (TID) | ORAL | 0 refills | Status: AC | PRN

## 2022-11-02 NOTE — Progress Notes (Addendum)
   Subjective:    Patient ID: Jody Wilson, female    DOB: Sep 11, 1951, 71 y.o.   MRN: [id number]  HPI Patient feel on 10/13/22 and she fell down on side of bath tub. She went yo urgent care and ED. She had xrays and has a sternal fracture. She is having lots of chest and right breast pain. She was given lortab 5/325 which has not really helped   Review of Systems  Constitutional:  Negative for diaphoresis.  Eyes:  Negative for pain.  Respiratory:  Negative for shortness of breath.   Cardiovascular:  Negative for chest pain, palpitations and leg swelling.  Gastrointestinal:  Negative for abdominal pain.  Endocrine: Negative for polydipsia.  Skin:  Negative for rash.  Neurological:  Negative for dizziness, weakness and headaches.  Hematological:  Does not bruise/bleed easily.  All other systems reviewed and are negative.      Objective:   Physical Exam Constitutional:      Appearance: Normal appearance.  Cardiovascular:     Rate and Rhythm: Normal rate and regular rhythm.     Heart sounds: Normal heart sounds.  Pulmonary:     Breath sounds: Normal breath sounds.  Skin:    General: Skin is warm.  Neurological:     General: No focal deficit present.     Mental Status: She is alert and oriented to person, place, and time.  Psychiatric:        Mood and Affect: Mood normal.        Behavior: Behavior normal.     BP 121/81   Pulse (!) 58   Temp (!) 97.3 F (36.3 C) (Temporal)   Resp 20   Ht '5\' 5"'$  (1.651 m)   Wt 146 lb (66.2 kg)   SpO2 97%   BMI 24.30 kg/m        Assessment & Plan:   Jody Wilson in today with chief complaint of Hospitalization Follow-up   1. Sternal pain - DG Sternum; Future  2. Closed fracture of sternum, unspecified portion of sternum, initial encounter Splint area and cough and deep breathe every 2 hours Meds ordered this encounter  Medications   HYDROcodone-acetaminophen (NORCO) 10-325 MG tablet    Sig: Take 1 tablet by mouth  every 8 (eight) hours as needed for up to 5 days.    Dispense:  15 tablet    Refill:  0    Order Specific Question:   Supervising Provider    Answer:   [name] A [[id number]]       The above assessment and management plan was discussed with the patient. The patient verbalized understanding of and has agreed to the management plan. Patient is aware to call the clinic if symptoms persist or worsen. Patient is aware when to return to the clinic for a follow-up visit. Patient educated on when it is appropriate to go to the emergency department.   Mary-Margaret Hassell Done, FNP

## 2022-11-02 NOTE — Patient Instructions (Signed)
Sternal Fracture  A sternal fracture is a break in the bone in the center of the chest (sternum or breastbone). This type of fracture often causes pain that can get worse when you breathe deeply or cough. It is not dangerous unless there is also an injury to your heart or lungs. Your heart and lungs are protected by the sternum and ribs. What are the causes? This condition is often caused by a forceful injury. This may be from: A motor vehicle accident. This is the most common cause. Contact sports. A physical attack (assault). A fall. You can also get a sternal fracture without having a forceful injury. This can happen if the bone weakens over time (stress fracture or insufficiency fracture). What increases the risk? You are more likely to have this condition if you: Do contact sports. These include football, lacrosse, wrestling, and martial arts. Work at high heights, such as in construction. This increases your risk of a fall. You may be more likely to get a stress fracture or insufficiency fracture if: You are female. You are postmenopausal. You are 50 years of age or older. You have weak bones (osteoporosis). You have a severe curve to your spine. You are on long-term steroid treatment. What are the signs or symptoms? Symptoms of this condition include: Pain over the sternum or chest wall. A tender sternum or chest wall. Pain that gets worse when you breathe deeply or cough. Shortness of breath. A bruise (contusion) over the chest. Swelling. A crackling sound when you take a deep breath or press on your sternum. How is this diagnosed? This condition is diagnosed based on: A physical exam. Your medical history. Tests, such as: Blood oxygen level. This is taken with a pulse oximetry test. Electrocardiograms (ECGs). These are done to make sure that your heart is not hurt. A blood test. This is to check for damage to your heart muscle. Imaging tests, such as a CT scan,  ultrasound, and chest X-rays. How is this treated? Treatment depends on how severe your injury is. A fracture without any other injury (isolated sternal fracture) often heals without treatment. You may need to: Limit some activities at home. Take medicines for pain relief. Do deep breathing exercises. These can prevent injury and infection to your lungs. In rare cases, surgery may be needed if the fracture: Causes severe pain that does not go away. Causes shortness of breath or breathing problems. Involves bones that have been moved too far out of place (displaced fracture). Follow these instructions at home: Managing pain, stiffness, and swelling  If directed, put ice on the injured area. To do this: Put ice in a plastic bag. Place a towel between your skin and the bag. Leave the ice on for 20 minutes, 2-3 times a day. If your skin turns bright red, remove the ice right away to prevent skin damage. The risk of skin damage is higher if you cannot feel pain, heat, or cold. Medicines Take over-the-counter and prescription medicines only as told by your health care provider. Ask your health care provider if the medicine prescribed to you: Requires you to avoid driving or using machinery. Can cause constipation. You may need to take these actions to prevent or treat constipation: Drink enough fluid to keep your urine pale yellow. Take over-the-counter or prescription medicines. Eat foods that are high in fiber, such as beans, whole grains, and fresh fruits and vegetables. Limit foods that are high in fat and processed sugars, such as fried or   sweet foods. Activity Rest as told by your health care provider. Do breathing exercises as told by your health care provider. Do not push or pull with your arms when getting in and out of bed. You may have to avoid lifting. Ask your health care provider how much you can safely lift. Return to your normal activities as told by your health care  provider. Ask your health care provider what activities are safe for you. General instructions Hug a pillow when you sneeze, cough, twist, or bend at the waist. Doing this helps support your chest. Do not use any products that contain nicotine or tobacco. These products include cigarettes, chewing tobacco, and vaping devices, such as e-cigarettes. If you need help quitting, ask your health care provider. Contact a health care provider if: Your pain medicine is not helping. You still have pain after a few weeks. You have swelling or bruising that gets worse. You get a fever or chills. You get a cough, and you cough up thick or bloody mucus from your lungs (sputum). Get help right away if: You have trouble breathing. You have chest pain. You feel light-headed. You have fast or irregular heartbeats (palpitations). You feel nauseous or have pain in your abdomen. These symptoms may be an emergency. Get help right away. Call 911. Do not wait to see if the symptoms will go away. Do not drive yourself to the hospital. This information is not intended to replace advice given to you by your health care provider. Make sure you discuss any questions you have with your health care provider. Document Revised: 06/14/2022 Document Reviewed: 06/14/2022 Elsevier Patient Education  2023 [employer]  

## 2022-11-10 ENCOUNTER — Telehealth: Payer: Self-pay | Admitting: Family Medicine

## 2022-11-10 NOTE — Telephone Encounter (Signed)
Pt aware of provider feedback and voiced understanding. 

## 2022-11-10 NOTE — Telephone Encounter (Signed)
Ok for IBU '800mg'$  BID?

## 2022-11-10 NOTE — Telephone Encounter (Signed)
Yes I am okay with her taking ibuprofen 800 mg twice a day, please make sure she always takes it with food though

## 2022-11-18 ENCOUNTER — Ambulatory Visit: Payer: Medicare Other | Admitting: Cardiology

## 2022-11-22 ENCOUNTER — Other Ambulatory Visit (HOSPITAL_COMMUNITY): Payer: Self-pay | Admitting: Physician Assistant

## 2022-11-22 DIAGNOSIS — R7989 Other specified abnormal findings of blood chemistry: Secondary | ICD-10-CM

## 2022-11-25 ENCOUNTER — Encounter (HOSPITAL_COMMUNITY): Payer: Self-pay | Admitting: Hematology

## 2022-11-25 ENCOUNTER — Other Ambulatory Visit: Payer: Self-pay

## 2022-11-25 ENCOUNTER — Ambulatory Visit: Payer: Medicare Other | Admitting: Nurse Practitioner

## 2022-11-25 DIAGNOSIS — R7989 Other specified abnormal findings of blood chemistry: Secondary | ICD-10-CM

## 2022-11-25 MED ORDER — CYANOCOBALAMIN 1000 MCG/ML IJ SOLN: INTRAMUSCULAR | 6 refills | Status: DC

## 2022-12-01 ENCOUNTER — Ambulatory Visit: Payer: Medicare Other | Admitting: Family Medicine

## 2022-12-21 ENCOUNTER — Other Ambulatory Visit: Payer: Self-pay

## 2022-12-21 DIAGNOSIS — Z87891 Personal history of nicotine dependence: Secondary | ICD-10-CM

## 2022-12-21 DIAGNOSIS — Z122 Encounter for screening for malignant neoplasm of respiratory organs: Secondary | ICD-10-CM

## 2022-12-21 NOTE — Progress Notes (Signed)
Order placed for LDCT per protocol. Scan schedule for 02/09/23 at 0945a

## 2022-12-24 ENCOUNTER — Ambulatory Visit (INDEPENDENT_AMBULATORY_CARE_PROVIDER_SITE_OTHER): Payer: Medicare Other

## 2022-12-24 VITALS — Ht 65.0 in | Wt 146.0 lb

## 2022-12-24 DIAGNOSIS — Z0000 Encounter for general adult medical examination without abnormal findings: Secondary | ICD-10-CM | POA: Diagnosis not present

## 2022-12-24 NOTE — Patient Instructions (Signed)
Jody Wilson , Thank you for taking time to come for your Medicare Wellness Visit. I appreciate your ongoing commitment to your health goals. Please review the following plan we discussed and let me know if I can assist you in the future.   These are the goals we discussed:  Goals       DIET - INCREASE WATER INTAKE      DIET - REDUCE SALT INTAKE TO 2 GRAMS PER DAY OR LESS      Have 3 meals a day      Try to include lean protein, vegetables, fruits and whole grains.  If you do not feel hungry between breakfast and supper- try having a snack like fruit and nuts, protein shake, or peanut butter crackers.       OSTEOPOROSIS (pt-stated)      Current Barriers:  Unable to independently afford treatment regimen Unable to achieve control of osteoporosis   Pharmacist Clinical Goal(s):  Over the next year days, patient will verbalize ability to afford treatment regimen achieve control of osteoporosis as evidenced by improved DEXA scan through collaboration with PharmD and provider.    Interventions: 1:1 collaboration with Dettinger, Fransisca Kaufmann, MD regarding development and update of comprehensive plan of care as evidenced by provider attestation and co-signature Inter-disciplinary care team collaboration (see longitudinal plan of care) Comprehensive medication review performed; medication list updated in electronic medical record  Osteoporosis: Current treatment: WAS ON PROLIA--STOPPED DUE TO [place] previously tried for Osteoporosis/Osteopenia: Tried both oral fosamax and actonel. Experienced shoulder and chest pain with both;  was on forteo (89yrmax); cannot tolerate prolia due to bone pain Evenity is too expensive DEXA FINDINGS (07/2021): AP LUMBAR SPINE L1-L4 Bone Mineral Density (BMD):  1.032 g/cm2 Young Adult T-Score:  -1.3 Z-Score:  0.2  LEFT FEMUR NECK Bone Mineral Density (BMD):  0.654 g/cm2 Young Adult T-Score: -2.8 Z-Score:  -1.2 ASSESSMENT: Patient's diagnostic category  is OSTEOPOROSIS by WHO Criteria.  FRACTURE RISK: INCREASED COMPARISON: 01/31/2017. No significant change in bone mineral density of the hips. Supplementation: CALCIUM/VITAMIN D;  We have exhausted all treatment options at this point.  Will continue to discuss potentially trying another bisphos or restarting prolia (patient has pain at baseline, so we are unsure if medications are the culprit)   Patient Goals/Self-Care Activities Over the next year, patient will:  - take medications as prescribed engage in dietary modifications by INCREASING CALCIUM AND VITAMIN D INTAKE  Follow Up Plan: Telephone follow up appointment with care management team member scheduled for:2 MONTHS        Prevent falls      Quit Smoking        This is a list of the screening recommended for you and due dates:  Health Maintenance  Topic Date Due   Zoster (Shingles) Vaccine (1 of 2) Never done   COVID-19 Vaccine (4 - 2023-24 season) 08/20/2022   Flu Shot  03/20/2023*   DEXA scan (bone density measurement)  07/25/2023   Mammogram  08/26/2023   Medicare Annual Wellness Visit  12/25/2023   Colon Cancer Screening  07/17/2025   DTaP/Tdap/Td vaccine (3 - Td or Tdap) 08/30/2032   Pneumonia Vaccine  Completed   Hepatitis C Screening: USPSTF Recommendation to screen - Ages 141-79yo.  Completed   HPV Vaccine  Aged Out  *Topic was postponed. The date shown is not the original due date.    Advanced directives: Please bring a copy of your health care power of attorney and  living will to the office to be added to your chart at your convenience.   Conditions/risks identified: Aim for 30 minutes of exercise or brisk walking, 6-8 glasses of water, and 5 servings of fruits and vegetables each day. If you wish to quit smoking, help is available. For free tobacco cessation program offerings call the [facility] at 314-754-2382 or Live Well Line at (629) 786-1203. You may also visit [email or web address] or email  [email or web address] for more information on other programs.   You may also call 1-800-QUIT-NOW 518-874-2450) or visit www.[facility].dk or www.[facility].org for additional resources on smoking cessation.    Next appointment: Follow up in one year for your annual wellness visit    Preventive Care 65 Years and Older, Female Preventive care refers to lifestyle choices and visits with your health care provider that can promote health and wellness. What does preventive care include? A yearly physical exam. This is also called an annual well check. Dental exams once or twice a year. Routine eye exams. Ask your health care provider how often you should have your eyes checked. Personal lifestyle choices, including: Daily care of your teeth and gums. Regular physical activity. Eating a healthy diet. Avoiding tobacco and drug use. Limiting alcohol use. Practicing safe sex. Taking low-dose aspirin every day. Taking vitamin and mineral supplements as recommended by your health care provider. What happens during an annual well check? The services and screenings done by your health care provider during your annual well check will depend on your age, overall health, lifestyle risk factors, and family history of disease. Counseling  Your health care provider may ask you questions about your: Alcohol use. Tobacco use. Drug use. Emotional well-being. Home and relationship well-being. Sexual activity. Eating habits. History of falls. Memory and ability to understand (cognition). Work and work Statistician. Reproductive health. Screening  You may have the following tests or measurements: Height, weight, and BMI. Blood pressure. Lipid and cholesterol levels. These may be checked every 5 years, or more frequently if you are over 56 years old. Skin check. Lung cancer screening. You may have this screening every year starting at age 74 if you have a 30-pack-year history of smoking and  currently smoke or have quit within the past 15 years. Fecal occult blood test (FOBT) of the stool. You may have this test every year starting at age 58. Flexible sigmoidoscopy or colonoscopy. You may have a sigmoidoscopy every 5 years or a colonoscopy every 10 years starting at age 46. Hepatitis C blood test. Hepatitis B blood test. Sexually transmitted disease (STD) testing. Diabetes screening. This is done by checking your blood sugar (glucose) after you have not eaten for a while (fasting). You may have this done every 1-3 years. Bone density scan. This is done to screen for osteoporosis. You may have this done starting at age 38. Mammogram. This may be done every 1-2 years. Talk to your health care provider about how often you should have regular mammograms. Talk with your health care provider about your test results, treatment options, and if necessary, the need for more tests. Vaccines  Your health care provider may recommend certain vaccines, such as: Influenza vaccine. This is recommended every year. Tetanus, diphtheria, and acellular pertussis (Tdap, Td) vaccine. You may need a Td booster every 10 years. Zoster vaccine. You may need this after age 68. Pneumococcal 13-valent conjugate (PCV13) vaccine. One dose is recommended after age 61. Pneumococcal polysaccharide (PPSV23) vaccine. One dose is recommended after age 72. Talk  to your health care provider about which screenings and vaccines you need and how often you need them. This information is not intended to replace advice given to you by your health care provider. Make sure you discuss any questions you have with your health care provider. Document Released: 01/02/2016 Document Revised: 08/25/2016 Document Reviewed: 10/07/2015 Elsevier Interactive Patient Education  2017 [address] [place] Prevention in the Home Falls can cause injuries. They can happen to people of all ages. There are many things you can do to make your home  safe and to help prevent falls. What can I do on the outside of my home? Regularly fix the edges of walkways and driveways and fix any cracks. Remove anything that might make you trip as you walk through a door, such as a raised step or threshold. Trim any bushes or trees on the path to your home. Use bright outdoor lighting. Clear any walking paths of anything that might make someone trip, such as rocks or tools. Regularly check to see if handrails are loose or broken. Make sure that both sides of any steps have handrails. Any raised decks and porches should have guardrails on the edges. Have any leaves, snow, or ice cleared regularly. Use sand or salt on walking paths during winter. Clean up any spills in your garage right away. This includes oil or grease spills. What can I do in the bathroom? Use night lights. Install grab bars by the toilet and in the tub and shower. Do not use towel bars as grab bars. Use non-skid mats or decals in the tub or shower. If you need to sit down in the shower, use a plastic, non-slip stool. Keep the floor dry. Clean up any water that spills on the floor as soon as it happens. Remove soap buildup in the tub or shower regularly. Attach bath mats securely with double-sided non-slip rug tape. Do not have throw rugs and other things on the floor that can make you trip. What can I do in the bedroom? Use night lights. Make sure that you have a light by your bed that is easy to reach. Do not use any sheets or blankets that are too big for your bed. They should not hang down onto the floor. Have a firm chair that has side arms. You can use this for support while you get dressed. Do not have throw rugs and other things on the floor that can make you trip. What can I do in the kitchen? Clean up any spills right away. Avoid walking on wet floors. Keep items that you use a lot in easy-to-reach places. If you need to reach something above you, use a strong step  stool that has a grab bar. Keep electrical cords out of the way. Do not use floor polish or wax that makes floors slippery. If you must use wax, use non-skid floor wax. Do not have throw rugs and other things on the floor that can make you trip. What can I do with my stairs? Do not leave any items on the stairs. Make sure that there are handrails on both sides of the stairs and use them. Fix handrails that are broken or loose. Make sure that handrails are as long as the stairways. Check any carpeting to make sure that it is firmly attached to the stairs. Fix any carpet that is loose or worn. Avoid having throw rugs at the top or bottom of the stairs. If you do have throw rugs,  attach them to the floor with carpet tape. Make sure that you have a light switch at the top of the stairs and the bottom of the stairs. If you do not have them, ask someone to add them for you. What else can I do to help prevent falls? Wear shoes that: Do not have high heels. Have rubber bottoms. Are comfortable and fit you well. Are closed at the toe. Do not wear sandals. If you use a stepladder: Make sure that it is fully opened. Do not climb a closed stepladder. Make sure that both sides of the stepladder are locked into place. Ask someone to hold it for you, if possible. Clearly mark and make sure that you can see: Any grab bars or handrails. First and last steps. Where the edge of each step is. Use tools that help you move around (mobility aids) if they are needed. These include: Canes. Walkers. Scooters. Crutches. Turn on the lights when you go into a dark area. Replace any light bulbs as soon as they burn out. Set up your furniture so you have a clear path. Avoid moving your furniture around. If any of your floors are uneven, fix them. If there are any pets around you, be aware of where they are. Review your medicines with your doctor. Some medicines can make you feel dizzy. This can increase your  chance of falling. Ask your doctor what other things that you can do to help prevent falls. This information is not intended to replace advice given to you by your health care provider. Make sure you discuss any questions you have with your health care provider. Document Released: 10/02/2009 Document Revised: 05/13/2016 Document Reviewed: 01/10/2015 Elsevier Interactive Patient Education  2017 [employer].

## 2022-12-24 NOTE — Progress Notes (Signed)
Subjective:   Jody Wilson is a 72 y.o. female who presents for Medicare Annual (Subsequent) preventive examination.  I connected with  Jody Wilson on 12/24/22 by a audio enabled telemedicine application and verified that I am speaking with the correct person using two identifiers.  Patient Location: Home  Provider Location: Home Office  I discussed the limitations of evaluation and management by telemedicine. The patient expressed understanding and agreed to proceed.   Review of Systems     Cardiac Risk Factors include: advanced age (>20mn, >>63women);dyslipidemia;hypertension;sedentary lifestyle;smoking/ tobacco exposure     Objective:    Today's Vitals   12/24/22 0906 12/24/22 0907  Weight: 146 lb (66.2 kg)   Height: '5\' 5"'$  (1.651 m)   PainSc:  4    Body mass index is 24.3 kg/m.     12/24/2022    9:17 AM 10/16/2022    6:18 PM 07/08/2022    9:01 AM 12/09/2021   11:24 AM 10/30/2021   11:36 AM 07/06/2021    2:21 PM 12/18/2020    9:57 AM  Advanced Directives  Does Patient Have a Medical Advance Directive? Yes No No No No No No  Type of AParamedicof A[place] will        Copy of [place]? No - copy requested        Would patient like information on creating a medical advance directive?   No - Patient declined No - Patient declined No - Patient declined No - Patient declined     Current Medications (verified) Outpatient Encounter Medications as of 12/24/2022  Medication Sig   ALPRAZolam (XANAX) 0.5 MG tablet Take 1 tablet (0.5 mg total) by mouth 2 (two) times daily as needed for anxiety.   aspirin EC 81 MG tablet Take 1 tablet (81 mg total) by mouth daily. Swallow whole.   calcium-vitamin D (OSCAL WITH D) 500-200 MG-UNIT tablet Take 1 tablet by mouth daily with lunch.    citalopram (CELEXA) 40 MG tablet Take 1 tablet (40 mg total) by mouth daily.   clobetasol (TEMOVATE) 0.05 % external solution Apply  topically 2 (two) times daily as needed.   cyanocobalamin (VITAMIN B12) 1000 MCG/ML injection INJECT 1 ML INTO MUSCLE EVERY 28 DAYS   ergocalciferol (VITAMIN D2) 1.25 MG (50000 UT) capsule Take 1 capsule (50,000 Units total) by mouth once a week.   ezetimibe (ZETIA) 10 MG tablet TAKE 1 TABLET DAILY   folic acid (FOLVITE) 1 MG tablet Take 1 mg by mouth daily.   hydrochlorothiazide (MICROZIDE) 12.5 MG capsule Take 1 capsule (12.5 mg total) by mouth daily.   levETIRAcetam (KEPPRA) 250 MG tablet Take 250 mg by mouth at bedtime.   Multiple Vitamins-Minerals (WOMENS 50+ MULTI VITAMIN/MIN PO) Take 1 tablet by mouth every Monday, Tuesday, Wednesday, Thursday, and Friday.    olmesartan (BENICAR) 40 MG tablet Take 1 tablet (40 mg total) by mouth daily. (Patient taking differently: Take 40 mg by mouth daily. Pt takes '20mg'$  in am and 20 mg in PM)   pantoprazole (PROTONIX) 40 MG tablet Take 1 tablet (40 mg total) by mouth daily.   pravastatin (PRAVACHOL) 40 MG tablet Take 1 tablet (40 mg total) by mouth every evening.   [DISCONTINUED] metroNIDAZOLE (METROGEL) 0.75 % gel Apply topically.   No facility-administered encounter medications on file as of 12/24/2022.    Allergies (verified) Tetracyclines & related   History: Past Medical History:  Diagnosis Date   Anemia  Anxiety    Aortic atherosclerosis (HCC)    Depression    GERD (gastroesophageal reflux disease)    Hyperlipidemia    Hypertension    Orthostatic hypotension    Osteoporosis    Past Surgical History:  Procedure Laterality Date   ABDOMINAL HYSTERECTOMY     BACK SURGERY     CHOLECYSTECTOMY     FOOT SURGERY     7 in the past   IR GENERIC HISTORICAL  12/09/2016   IR RADIOLOGIST EVAL & MGMT 12/09/2016 [facility]   IR GENERIC HISTORICAL  12/10/2016   IR VERTEBROPLASTY CERV/THOR BX INC UNI/BIL INC/INJECT/IMAGING 12/10/2016 [facility]   LEFT HEART CATH AND CORONARY ANGIOGRAPHY N/A 09/30/2020   Procedure: LEFT HEART CATH AND  CORONARY ANGIOGRAPHY;  Surgeon: Nigel Mormon, MD;  Location: [facility];  Service: Cardiovascular;  Laterality: N/A;   TRACHEOSTOMY TUBE PLACEMENT  1982   Family History  Problem Relation Age of Onset   Heart disease Mother    Hypertension Mother    Hyperlipidemia Mother    Diabetes Mother    Congestive Heart Failure Mother    Aneurysm Paternal Uncle    Heart attack Maternal Grandmother    Heart attack Maternal Grandfather    Hypertension Son    Other Son 37       Kienbock's Disease   Liver disease Son 45       Gilbert's Disease   Lupus Half-Sister    Atrial fibrillation Half-Sister    Breast cancer Niece    Social History   Socioeconomic History   Marital status: Married    Spouse name: Donnie   Number of children: 2   Years of education: 12   Highest education level: Associate degree: occupational, Hotel manager, or vocational program  Occupational History   Occupation: retired    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  Tobacco Use   Smoking status: Former    Packs/day: 0.25    Types: Cigarettes    Passive exposure: Never   Smokeless tobacco: Never   Tobacco comments:    Trying to cut on number of cigarettes she smokes.  Vaping Use   Vaping Use: Never used  Substance and Sexual Activity   Alcohol use: Not Currently    Comment: occassionally    Drug use: No   Sexual activity: Not on file  Other Topics Concern   Not on file  Social History Narrative   Lives with her husband, [name], and has two grown sons and two grand sons.   She visits family and has a once weekly outing with a friend for socializing.  She enjoys making beaded jewelry, reading and walking.   Social Determinants of Health   Financial Resource Strain: Low Risk  (12/24/2022)   Overall Financial Resource Strain (CARDIA)    Difficulty of Paying Living Expenses: Not hard at all  Food Insecurity: No Food Insecurity (12/24/2022)   Hunger Vital  Sign    Worried About Running Out of Food in the Last Year: Never true    Ran Out of Food in the Last Year: Never true  Transportation Needs: No Transportation Needs (12/24/2022)   PRAPARE - Hydrologist (Medical): No    Lack of Transportation (Non-Medical): No  Physical Activity: Inactive (12/24/2022)   Exercise Vital Sign    Days of Exercise per Week: 0 days    Minutes of Exercise per Session: 0 min  Stress:  No Stress Concern Present (12/24/2022)   [place]    Feeling of Stress : Not at all  Social Connections: [place] (12/24/2022)   Social Connection and Isolation Panel [NHANES]    Frequency of Communication with Friends and Family: More than three times a week    Frequency of Social Gatherings with Friends and Family: More than three times a week    Attends Religious Services: More than 4 times per year    Active Member of [employer] or Organizations: Yes    Attends Music therapist: More than 4 times per year    Marital Status: Married    Tobacco Counseling Counseling given: Yes Tobacco comments: Trying to cut on number of cigarettes she smokes.   Clinical Intake:  Pre-visit preparation completed: Yes  Pain : 0-10 Pain Score: 4  Pain Type: Chronic pain Pain Location: Sternum Pain Descriptors / Indicators: Aching Pain Onset: More than a month ago Pain Frequency: Intermittent Pain Relieving Factors: stretching  Pain Relieving Factors: stretching  BMI - recorded: 24.3 Nutritional Status: BMI of 19-24  Normal Nutritional Risks: None Diabetes: No  How often do you need to have someone help you when you read instructions, pamphlets, or other written materials from your doctor or pharmacy?: 1 - Never  Diabetic? no  Interpreter Needed?: No  Information entered by :: Boris Engelmann, LPN   Activities of Daily Living    12/24/2022    9:17 AM  In your present  state of health, do you have any difficulty performing the following activities:  Hearing? 0  Vision? 0  Difficulty concentrating or making decisions? 0  Walking or climbing stairs? 0  Dressing or bathing? 0  Doing errands, shopping? 0  Preparing Food and eating ? N  Using the Toilet? N  In the past six months, have you accidently leaked urine? N  Do you have problems with loss of bowel control? N  Managing your Medications? N  Managing your Finances? N  Housekeeping or managing your Housekeeping? N    Patient Care Team: Dettinger, Fransisca Kaufmann, MD as PCP - General (Family Medicine) Steffanie Rainwater, DPM as Consulting Physician (Podiatry) Arta Silence, MD as Consulting Physician (Gastroenterology) Rex Kras, DO as Consulting Physician (Cardiology) Lavera Guise, [facility] as Pharmacist (Family Medicine) Konrad Saha as Physician Assistant (Oncology) Celestia Khat, [place] (Optometry) Early, Arvilla Meres, MD as Consulting Physician (Vascular Surgery)  Indicate any recent Medical Services you may have received from other than [facility] providers in the past year (date may be approximate).     Assessment:   This is a routine wellness examination for Andrianna.  Hearing/Vision screen Hearing Screening - Comments:: Denies hearing difficulties   Vision Screening - Comments:: Wears rx glasses - up to date with routine eye exams with [place] - Due for appt now  Dietary issues and exercise activities discussed: Current Exercise Habits: The patient does not participate in regular exercise at present, Exercise limited by: orthopedic condition(s)   Goals Addressed             This Visit's Progress    Prevent falls   On track    Quit Smoking   On track      Depression Screen    12/24/2022    9:15 AM 11/02/2022   12:33 PM 08/30/2022    1:28 PM 08/30/2022    1:27 PM 05/27/2022    1:10 PM 01/22/2022    2:19  PM 12/09/2021   11:23 AM  PHQ 2/9 Scores  PHQ - 2 Score '1 2 1 1 1 1 '$ 0   PHQ- 9 Score '6 9 6  12 8 3    '$ Fall Risk    12/24/2022    9:10 AM 11/02/2022   12:33 PM 08/30/2022    1:27 PM 08/25/2022    2:25 PM 05/27/2022    1:10 PM  Fall Risk   Falls in the past year? 1 1 0 0 0  Number falls in past yr: 1 1     Injury with Fall? 1 1     Risk for fall due to : History of fall(s) History of fall(s)     Follow up Falls prevention discussed;Education provided Education provided       [place]:  Any stairs in or around the home? No  If so, are there any without handrails? No  Home free of loose throw rugs in walkways, pet beds, electrical cords, etc? Yes  Adequate lighting in your home to reduce risk of falls? Yes   ASSISTIVE DEVICES UTILIZED TO PREVENT FALLS:  Life alert? No  Use of a cane, walker or w/c? No  Grab bars in the bathroom? No  Shower chair or bench in shower? No  Elevated toilet seat or a handicapped toilet? No   TIMED UP AND GO:  Was the test performed? No . Telephonic visit  Cognitive Function:    11/30/2018    4:24 PM  MMSE - Mini Mental State Exam  Orientation to time 5  Orientation to Place 5  Registration 3  Attention/ Calculation 5  Recall 3  Language- name 2 objects 2  Language- repeat 1  Language- follow 3 step command 3  Language- read & follow direction 1  Write a sentence 1  Copy design 1  Total score 30        12/24/2022    9:18 AM 12/09/2021   11:27 AM 12/08/2020    1:50 PM 12/04/2019    2:59 PM  6CIT Screen  What Year? 0 points 0 points 0 points 0 points  What month? 0 points 0 points 0 points 0 points  What time? 0 points 0 points 0 points 0 points  Count back from 20 0 points 0 points 0 points 0 points  Months in reverse 0 points 0 points 0 points 0 points  Repeat phrase 0 points 0 points 0 points 0 points  Total Score 0 points 0 points 0 points 0 points    Immunizations Immunization History  Administered Date(s) Administered   Fluad Quad(high Dose 65+) 09/25/2019,  10/09/2020, 10/22/2021   Hep B, Unspecified 07/15/2003, 08/15/2003, 02/06/2004   Influenza, High Dose Seasonal PF 11/30/2018   Moderna Sars-Covid-2 Vaccination 02/25/2020, 03/24/2020, 10/21/2020   Pneumococcal Conjugate-13 03/15/2018   Pneumococcal Polysaccharide-23 05/30/2019   Td 07/15/2003   Tdap 08/30/2022    TDAP status: Up to date  Flu Vaccine status: Due, Education has been provided regarding the importance of this vaccine. Advised may receive this vaccine at local pharmacy or Health Dept. Aware to provide a copy of the vaccination record if obtained from local pharmacy or Health Dept. Verbalized acceptance and understanding.  Pneumococcal vaccine status: Up to date  Covid-19 vaccine status: Information provided on how to obtain vaccines.   Qualifies for Shingles Vaccine? Yes   Zostavax completed No   Shingrix Completed?: No.    Education has been provided regarding the importance  of this vaccine. Patient has been advised to call insurance company to determine out of pocket expense if they have not yet received this vaccine. Advised may also receive vaccine at local pharmacy or Health Dept. Verbalized acceptance and understanding.  Screening Tests Health Maintenance  Topic Date Due   Zoster Vaccines- Shingrix (1 of 2) Never done   COVID-19 Vaccine (4 - 2023-24 season) 08/20/2022   INFLUENZA VACCINE  03/20/2023 (Originally 07/20/2022)   DEXA SCAN  07/25/2023   MAMMOGRAM  08/26/2023   Medicare Annual Wellness (AWV)  12/25/2023   COLONOSCOPY (Pts 45-33yr Insurance coverage will need to be confirmed)  07/17/2025   DTaP/Tdap/Td (3 - Td or Tdap) 08/30/2032   Pneumonia Vaccine 72 Years old  Completed   Hepatitis C Screening  Completed   HPV VACCINES  Aged Out    Health Maintenance  Health Maintenance Due  Topic Date Due   Zoster Vaccines- Shingrix (1 of 2) Never done   COVID-19 Vaccine (4 - 2023-24 season) 08/20/2022    Colorectal cancer screening: Type of screening:  Colonoscopy. Completed 07/17/2020. Repeat every 5 years  Mammogram status: Completed 08/25/2022. Repeat every year  Bone Density status: Completed 07/24/2021. Results reflect: Bone density results: OSTEOPOROSIS. Repeat every 2 years.  Lung Cancer Screening: (Low Dose CT Chest recommended if Age 72-80years, 30 pack-year currently smoking OR have quit w/in 15years.) does qualify.   Lung Cancer Screening Referral: deferred as she had CT angio 06/2022  Additional Screening:  Hepatitis C Screening: does qualify; Completed 06/15/2019  Vision Screening: Recommended annual ophthalmology exams for early detection of glaucoma and other disorders of the eye. Is the patient up to date with their annual eye exam?  Yes  Who is the provider or what is the name of the office in which the patient attends annual eye exams? [place] not established with a provider, would they like to be referred to a provider to establish care? No .   Dental Screening: Recommended annual dental exams for proper oral hygiene  Community Resource Referral / Chronic Care Management: CRR required this visit?  No   CCM required this visit?  No      Plan:     I have personally reviewed and noted the following in the patient's chart:   Medical and social history Use of alcohol, tobacco or illicit drugs  Current medications and supplements including opioid prescriptions. Patient is not currently taking opioid prescriptions. Functional ability and status Nutritional status Physical activity Advanced directives List of other physicians Hospitalizations, surgeries, and ER visits in previous 12 months Vitals Screenings to include cognitive, depression, and falls Referrals and appointments  In addition, I have reviewed and discussed with patient certain preventive protocols, quality metrics, and best practice recommendations. A written personalized care plan for preventive services as well as general  preventive health recommendations were provided to patient.     ASandrea Hammond LPN   10/26/16  Nurse Notes: She would like to discuss starting medication for osteoporosis.

## 2022-12-29 ENCOUNTER — Other Ambulatory Visit: Payer: Medicare Other

## 2022-12-29 DIAGNOSIS — D751 Secondary polycythemia: Secondary | ICD-10-CM | POA: Diagnosis not present

## 2022-12-29 DIAGNOSIS — D509 Iron deficiency anemia, unspecified: Secondary | ICD-10-CM | POA: Diagnosis not present

## 2022-12-29 DIAGNOSIS — E559 Vitamin D deficiency, unspecified: Secondary | ICD-10-CM | POA: Diagnosis not present

## 2022-12-29 DIAGNOSIS — E538 Deficiency of other specified B group vitamins: Secondary | ICD-10-CM | POA: Diagnosis not present

## 2022-12-29 DIAGNOSIS — R6889 Other general symptoms and signs: Secondary | ICD-10-CM | POA: Diagnosis not present

## 2022-12-29 DIAGNOSIS — R7989 Other specified abnormal findings of blood chemistry: Secondary | ICD-10-CM | POA: Diagnosis not present

## 2022-12-30 ENCOUNTER — Ambulatory Visit (INDEPENDENT_AMBULATORY_CARE_PROVIDER_SITE_OTHER): Payer: Medicare Other | Admitting: Family Medicine

## 2022-12-30 ENCOUNTER — Encounter: Payer: Self-pay | Admitting: Family Medicine

## 2022-12-30 VITALS — BP 150/84 | HR 75 | Temp 97.2°F | Ht 65.0 in | Wt 145.0 lb

## 2022-12-30 DIAGNOSIS — F419 Anxiety disorder, unspecified: Secondary | ICD-10-CM | POA: Diagnosis not present

## 2022-12-30 DIAGNOSIS — I6521 Occlusion and stenosis of right carotid artery: Secondary | ICD-10-CM | POA: Diagnosis not present

## 2022-12-30 DIAGNOSIS — Z23 Encounter for immunization: Secondary | ICD-10-CM

## 2022-12-30 DIAGNOSIS — E785 Hyperlipidemia, unspecified: Secondary | ICD-10-CM

## 2022-12-30 DIAGNOSIS — I10 Essential (primary) hypertension: Secondary | ICD-10-CM | POA: Diagnosis not present

## 2022-12-30 DIAGNOSIS — F32A Depression, unspecified: Secondary | ICD-10-CM | POA: Diagnosis not present

## 2022-12-30 DIAGNOSIS — E782 Mixed hyperlipidemia: Secondary | ICD-10-CM

## 2022-12-30 LAB — IRON AND TIBC
Iron Saturation: 44 % (ref 15–55)
Iron: 131 ug/dL (ref 27–139)
Total Iron Binding Capacity: 297 ug/dL (ref 250–450)
UIBC: 166 ug/dL (ref 118–369)

## 2022-12-30 LAB — CBC WITH DIFFERENTIAL/PLATELET
Basophils Absolute: 0 10*3/uL (ref 0.0–0.2)
Basos: 0 %
EOS (ABSOLUTE): 0.1 10*3/uL (ref 0.0–0.4)
Eos: 1 %
Hematocrit: 42.7 % (ref 34.0–46.6)
Hemoglobin: 15.1 g/dL (ref 11.1–15.9)
Immature Grans (Abs): 0 10*3/uL (ref 0.0–0.1)
Immature Granulocytes: 0 %
Lymphocytes Absolute: 1.5 10*3/uL (ref 0.7–3.1)
Lymphs: 24 %
MCH: 35.2 pg — ABNORMAL HIGH (ref 26.6–33.0)
MCHC: 35.4 g/dL (ref 31.5–35.7)
MCV: 100 fL — ABNORMAL HIGH (ref 79–97)
Monocytes Absolute: 0.4 10*3/uL (ref 0.1–0.9)
Monocytes: 6 %
Neutrophils Absolute: 4.4 10*3/uL (ref 1.4–7.0)
Neutrophils: 69 %
Platelets: 289 10*3/uL (ref 150–450)
RBC: 4.29 x10E6/uL (ref 3.77–5.28)
RDW: 12.5 % (ref 11.7–15.4)
WBC: 6.4 10*3/uL (ref 3.4–10.8)

## 2022-12-30 LAB — FERRITIN: Ferritin: 62 ng/mL (ref 15–150)

## 2022-12-30 MED ORDER — EZETIMIBE 10 MG PO TABS: 10.0000 mg | ORAL_TABLET | Freq: Every day | ORAL | 3 refills | Status: DC

## 2022-12-30 MED ORDER — ALPRAZOLAM 0.5 MG PO TABS: 0.5000 mg | ORAL_TABLET | Freq: Two times a day (BID) | ORAL | 2 refills | Status: DC | PRN

## 2022-12-30 NOTE — Progress Notes (Signed)
BP (!) 150/84   Pulse 75   Temp (!) 97.2 F (36.2 C)   Ht '5\' 5"'$  (1.651 m)   Wt 145 lb (65.8 kg)   SpO2 95%   BMI 24.13 kg/m    Subjective:   Patient ID: Jody Wilson, female    DOB: 1951/07/14, 72 y.o.   MRN: [id number]  HPI: Jody Wilson is a 72 y.o. female presenting on 12/30/2022 for Medical Management of Chronic Issues, Hypertension, Anxiety, and Depression   HPI Hypertension Patient is currently on olmesartan and hydrochlorothiazide, and their blood pressure today is 150/84, likely up because she has been out of her anxiety medicine. Patient denies any lightheadedness or dizziness. Patient denies headaches, blurred vision, chest pains, shortness of breath, or weakness. Denies any side effects from medication and is content with current medication.   Hyperlipidemia Patient is coming in for recheck of his hyperlipidemia. The patient is currently taking Zetia. They deny any issues with myalgias or history of liver damage from it. They deny any focal numbness or weakness or chest pain.   Anxiety depression recheck Current rx-alprazolam 0.5 mg twice daily as needed # meds rx-60/month Effectiveness of current meds-works well Adverse reactions form meds-denies any  Pill count performed-No Last drug screen -06/04/2022 ( high risk q41m moderate risk q635mlow risk yearly ) Urine drug screen today- No Was the NCMcHenryeviewed-yes  If yes were their any concerning findings? -   No flowsheet data found.   Controlled substance contract signed on: 06/04/2022  Relevant past medical, surgical, family and social history reviewed and updated as indicated. Interim medical history since our last visit reviewed. Allergies and medications reviewed and updated.  Review of Systems  Constitutional:  Negative for chills and fever.  Eyes:  Negative for visual disturbance.  Respiratory:  Negative for chest tightness and shortness of breath.   Cardiovascular:  Negative for chest pain  and leg swelling.  Musculoskeletal:  Negative for back pain and gait problem.  Skin:  Negative for rash.  Neurological:  Negative for light-headedness and headaches.  Psychiatric/Behavioral:  Negative for agitation and behavioral problems.   All other systems reviewed and are negative.   Per HPI unless specifically indicated above   Allergies as of 12/30/2022       Reactions   Tetracyclines & Related Itching, Swelling        Medication List        Accurate as of December 30, 2022  2:17 PM. If you have any questions, ask your nurse or doctor.          ALPRAZolam 0.5 MG tablet Commonly known as: XANAX Take 1 tablet (0.5 mg total) by mouth 2 (two) times daily as needed for anxiety.   aspirin EC 81 MG tablet Take 1 tablet (81 mg total) by mouth daily. Swallow whole.   calcium-vitamin D 500-200 MG-UNIT tablet Commonly known as: OSCAL WITH D Take 1 tablet by mouth daily with lunch.   citalopram 40 MG tablet Commonly known as: CELEXA Take 1 tablet (40 mg total) by mouth daily.   clobetasol 0.05 % external solution Commonly known as: TEMOVATE Apply topically 2 (two) times daily as needed.   cyanocobalamin 1000 MCG/ML injection Commonly known as: VITAMIN B12 INJECT 1 ML INTO MUSCLE EVERY 28 DAYS   ergocalciferol 1.25 MG (50000 UT) capsule Commonly known as: VITAMIN D2 Take 1 capsule (50,000 Units total) by mouth once a week.   ezetimibe 10 MG tablet Commonly known as: ZETIA  Take 1 tablet (10 mg total) by mouth daily.   folic acid 1 MG tablet Commonly known as: FOLVITE Take 1 mg by mouth daily.   hydrochlorothiazide 12.5 MG capsule Commonly known as: MICROZIDE Take 1 capsule (12.5 mg total) by mouth daily.   levETIRAcetam 250 MG tablet Commonly known as: KEPPRA Take 250 mg by mouth at bedtime.   olmesartan 40 MG tablet Commonly known as: BENICAR Take 1 tablet (40 mg total) by mouth daily. What changed: additional instructions   pantoprazole 40 MG  tablet Commonly known as: PROTONIX Take 1 tablet (40 mg total) by mouth daily.   pravastatin 40 MG tablet Commonly known as: PRAVACHOL Take 1 tablet (40 mg total) by mouth every evening.   WOMENS 50+ MULTI VITAMIN/MIN PO Take 1 tablet by mouth every Monday, Tuesday, Wednesday, Thursday, and Friday.         Objective:   BP (!) 150/84   Pulse 75   Temp (!) 97.2 F (36.2 C)   Ht '5\' 5"'$  (1.651 m)   Wt 145 lb (65.8 kg)   SpO2 95%   BMI 24.13 kg/m   Wt Readings from Last 3 Encounters:  12/30/22 145 lb (65.8 kg)  12/24/22 146 lb (66.2 kg)  11/02/22 146 lb (66.2 kg)    Physical Exam Vitals and nursing note reviewed.  Constitutional:      General: She is not in acute distress.    Appearance: She is well-developed. She is not diaphoretic.  Eyes:     Conjunctiva/sclera: Conjunctivae normal.  Cardiovascular:     Rate and Rhythm: Normal rate and regular rhythm.     Heart sounds: Normal heart sounds. No murmur heard. Pulmonary:     Effort: Pulmonary effort is normal. No respiratory distress.     Breath sounds: Normal breath sounds. No wheezing.  Musculoskeletal:        General: No swelling or tenderness. Normal range of motion.  Skin:    General: Skin is warm and dry.     Findings: No rash.  Neurological:     Mental Status: She is alert and oriented to person, place, and time.     Coordination: Coordination normal.  Psychiatric:        Mood and Affect: Mood is anxious and depressed.        Behavior: Behavior normal.        Thought Content: Thought content does not include suicidal ideation. Thought content does not include suicidal plan.     Results for orders placed or performed in visit on 12/29/22  VITAMIN D 25 Hydroxy (Vit-D Deficiency, Fractures)  Result Value Ref Range   Vit D, 25-Hydroxy 49.5 30.0 - 100.0 ng/mL  Vitamin B12  Result Value Ref Range   Vitamin B-12 574 232 - 1,245 pg/mL  Methylmalonic acid, serum  Result Value Ref Range   Methylmalonic Acid  WILL FOLLOW   Homocysteine  Result Value Ref Range   Homocysteine WILL FOLLOW   Folate  Result Value Ref Range   Folate >20.0 >3.0 ng/mL  CBC with Differential/Platelet  Result Value Ref Range   WBC 6.4 3.4 - 10.8 x10E3/uL   RBC 4.29 3.77 - 5.28 x10E6/uL   Hemoglobin 15.1 11.1 - 15.9 g/dL   Hematocrit 42.7 34.0 - 46.6 %   MCV 100 (H) 79 - 97 fL   MCH 35.2 (H) 26.6 - 33.0 pg   MCHC 35.4 31.5 - 35.7 g/dL   RDW 12.5 11.7 - 15.4 %   Platelets 289  150 - 450 x10E3/uL   Neutrophils 69 Not Estab. %   Lymphs 24 Not Estab. %   Monocytes 6 Not Estab. %   Eos 1 Not Estab. %   Basos 0 Not Estab. %   Neutrophils Absolute 4.4 1.4 - 7.0 x10E3/uL   Lymphocytes Absolute 1.5 0.7 - 3.1 x10E3/uL   Monocytes Absolute 0.4 0.1 - 0.9 x10E3/uL   EOS (ABSOLUTE) 0.1 0.0 - 0.4 x10E3/uL   Basophils Absolute 0.0 0.0 - 0.2 x10E3/uL   Immature Granulocytes 0 Not Estab. %   Immature Grans (Abs) 0.0 0.0 - 0.1 x10E3/uL  Iron and TIBC  Result Value Ref Range   Total Iron Binding Capacity 297 250 - 450 ug/dL   UIBC 166 118 - 369 ug/dL   Iron 131 27 - 139 ug/dL   Iron Saturation 44 15 - 55 %  Ferritin  Result Value Ref Range   Ferritin 62 15 - 150 ng/mL    Assessment & Plan:   Problem List Items Addressed This Visit       Cardiovascular and Mediastinum   Hypertension - Primary   Relevant Medications   ezetimibe (ZETIA) 10 MG tablet   Other Relevant Orders   CBC with Differential/Platelet   CMP14+EGFR     Other   Hyperlipidemia with target LDL less than 100   Relevant Medications   ezetimibe (ZETIA) 10 MG tablet   Other Relevant Orders   CBC with Differential/Platelet   CMP14+EGFR   Lipid panel   Anxiety and depression   Relevant Medications   ALPRAZolam (XANAX) 0.5 MG tablet   Other Relevant Orders   CBC with Differential/Platelet   Other Visit Diagnoses     Mixed hyperlipidemia       Relevant Medications   ezetimibe (ZETIA) 10 MG tablet   Other Relevant Orders   Lipid panel    Stenosis of right carotid artery       Relevant Medications   ezetimibe (ZETIA) 10 MG tablet       Continue current medicine, will do refills, likely blood pressure elevated today because of being out of her anxiety medicine. Follow up plan: Return in about 3 months (around 03/31/2023), or if symptoms worsen or fail to improve, for Anxiety and hypertension and cholesterol recheck.  Counseling provided for all of the vaccine components Orders Placed This Encounter  Procedures   CBC with Differential/Platelet   CMP14+EGFR   Lipid panel    [name], MD [facility] 12/30/2022, 2:17 PM

## 2022-12-31 LAB — CMP14+EGFR
ALT: 15 IU/L (ref 0–32)
AST: 26 IU/L (ref 0–40)
Albumin/Globulin Ratio: 1.6 (ref 1.2–2.2)
Albumin: 4.2 g/dL (ref 3.8–4.8)
Alkaline Phosphatase: 92 IU/L (ref 44–121)
BUN/Creatinine Ratio: 14 (ref 12–28)
BUN: 10 mg/dL (ref 8–27)
Bilirubin Total: 0.8 mg/dL (ref 0.0–1.2)
CO2: 27 mmol/L (ref 20–29)
Calcium: 9.6 mg/dL (ref 8.7–10.3)
Chloride: 100 mmol/L (ref 96–106)
Creatinine, Ser: 0.73 mg/dL (ref 0.57–1.00)
Globulin, Total: 2.6 g/dL (ref 1.5–4.5)
Glucose: 103 mg/dL — ABNORMAL HIGH (ref 70–99)
Potassium: 3.9 mmol/L (ref 3.5–5.2)
Sodium: 142 mmol/L (ref 134–144)
Total Protein: 6.8 g/dL (ref 6.0–8.5)
eGFR: 88 mL/min/{1.73_m2} (ref >59)

## 2022-12-31 LAB — CBC WITH DIFFERENTIAL/PLATELET
Basophils Absolute: 0 10*3/uL (ref 0.0–0.2)
Basos: 1 %
EOS (ABSOLUTE): 0.1 10*3/uL (ref 0.0–0.4)
Eos: 1 %
Hematocrit: 43.9 % (ref 34.0–46.6)
Hemoglobin: 15.3 g/dL (ref 11.1–15.9)
Immature Grans (Abs): 0 10*3/uL (ref 0.0–0.1)
Immature Granulocytes: 0 %
Lymphocytes Absolute: 1.9 10*3/uL (ref 0.7–3.1)
Lymphs: 28 %
MCH: 35 pg — ABNORMAL HIGH (ref 26.6–33.0)
MCHC: 34.9 g/dL (ref 31.5–35.7)
MCV: 101 fL — ABNORMAL HIGH (ref 79–97)
Monocytes Absolute: 0.5 10*3/uL (ref 0.1–0.9)
Monocytes: 7 %
Neutrophils Absolute: 4.2 10*3/uL (ref 1.4–7.0)
Neutrophils: 63 %
Platelets: 294 10*3/uL (ref 150–450)
RBC: 4.37 x10E6/uL (ref 3.77–5.28)
RDW: 12.6 % (ref 11.7–15.4)
WBC: 6.6 10*3/uL (ref 3.4–10.8)

## 2022-12-31 LAB — LIPID PANEL
Chol/HDL Ratio: 1.9 ratio (ref 0.0–4.4)
Cholesterol, Total: 176 mg/dL (ref 100–199)
HDL: 94 mg/dL (ref >39)
LDL Chol Calc (NIH): 66 mg/dL (ref 0–99)
Triglycerides: 90 mg/dL (ref 0–149)
VLDL Cholesterol Cal: 16 mg/dL (ref 5–40)

## 2023-01-03 LAB — VITAMIN B12: Vitamin B-12: 574 pg/mL (ref 232–1245)

## 2023-01-03 LAB — HOMOCYSTEINE: Homocysteine: 12.3 umol/L (ref 0.0–19.2)

## 2023-01-03 LAB — VITAMIN D 25 HYDROXY (VIT D DEFICIENCY, FRACTURES): Vit D, 25-Hydroxy: 49.5 ng/mL (ref 30.0–100.0)

## 2023-01-03 LAB — METHYLMALONIC ACID, SERUM: Methylmalonic Acid: 140 nmol/L (ref 0–378)

## 2023-01-03 LAB — FOLATE: Folate: >20 ng/mL (ref >3.0)

## 2023-01-07 ENCOUNTER — Inpatient Hospital Stay: Payer: Medicare Other | Attending: Physician Assistant | Admitting: Nurse Practitioner

## 2023-01-07 DIAGNOSIS — E559 Vitamin D deficiency, unspecified: Secondary | ICD-10-CM

## 2023-01-07 DIAGNOSIS — D509 Iron deficiency anemia, unspecified: Secondary | ICD-10-CM | POA: Diagnosis not present

## 2023-01-07 MED ORDER — ERGOCALCIFEROL 1.25 MG (50000 UT) PO CAPS: 50000.0000 [IU] | ORAL_CAPSULE | ORAL | 3 refills | Status: DC

## 2023-01-07 NOTE — Progress Notes (Signed)
Jody Wilson, Jody Wilson   Virtual Visit Progress Note  I connected with Jody Wilson on 01/07/23 at 11:30 AM EST by telephone visit and verified that I am speaking with the correct person using two identifiers.   I discussed the limitations, risks, security and privacy concerns of performing an evaluation and management service by telemedicine and the availability of in-person appointments. I also discussed with the patient that there may be a patient responsible charge related to this service. The patient expressed understanding and agreed to proceed.   Other persons participating in the visit and their role in the encounter: RN, NP, Patient   Patient's location: home Provider's location: [place]:  Medical Oncology/Hematology  PCP:  Dettinger, Jody Kaufmann, MD [address] [place] [address] [phone]  REASON FOR VISIT: Follow-up for normocytic anemia  CURRENT THERAPY: Intermittent IV iron; daily Y69 and folic acid supplement   INTERVAL HISTORY:  Patient agrees to evaluation via telemedicine for follow up and discussion of lab results. She feels at baseline and denies complaints.    REVIEW OF SYSTEMS:    Review of Systems  Constitutional:  Negative for appetite change, fatigue and unexpected weight change.  HENT:   Negative for mouth sores, sore throat and trouble swallowing.   Respiratory:  Negative for chest tightness and shortness of breath.   Cardiovascular:  Negative for leg swelling.  Gastrointestinal:  Negative for abdominal pain, constipation, diarrhea, nausea and vomiting.  Genitourinary:  Negative for bladder incontinence and dysuria.   Musculoskeletal:  Negative for flank pain and neck stiffness.  Skin:  Negative for itching, rash and wound.  Neurological:  Negative for dizziness, headaches, light-headedness and numbness.  Hematological:  Negative for adenopathy. Does not bruise/bleed easily.   Psychiatric/Behavioral:  Negative for confusion, depression and sleep disturbance. The patient is not nervous/anxious.      PAST MEDICAL/SURGICAL HISTORY:  Past Medical History:  Diagnosis Date   Anemia    Anxiety    Aortic atherosclerosis (HCC)    Depression    GERD (gastroesophageal reflux disease)    Hyperlipidemia    Hypertension    Orthostatic hypotension    Osteoporosis    Past Surgical History:  Procedure Laterality Date   ABDOMINAL HYSTERECTOMY     BACK SURGERY     CHOLECYSTECTOMY     FOOT SURGERY     7 in the past   IR GENERIC HISTORICAL  12/09/2016   IR RADIOLOGIST EVAL & MGMT 12/09/2016 [facility]   IR GENERIC HISTORICAL  12/10/2016   IR VERTEBROPLASTY CERV/THOR BX INC UNI/BIL INC/INJECT/IMAGING 12/10/2016 [facility]   LEFT HEART CATH AND CORONARY ANGIOGRAPHY N/A 09/30/2020   Procedure: LEFT HEART CATH AND CORONARY ANGIOGRAPHY;  Surgeon: Jody Mormon, MD;  Location: [facility];  Service: Cardiovascular;  Laterality: N/A;   TRACHEOSTOMY TUBE PLACEMENT  1982   SOCIAL HISTORY:  Social History   Socioeconomic History   Marital status: Married    Spouse name: Jody Wilson   Number of children: 2   Years of education: 12   Highest education level: Associate degree: occupational, Hotel manager, or vocational program  Occupational History   Occupation: retired    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  Tobacco Use   Smoking status: Former    Packs/day: 0.25    Types: Cigarettes    Passive exposure: Never   Smokeless tobacco: Never  Tobacco comments:    Trying to cut on number of cigarettes she smokes.  Vaping Use   Vaping Use: Never used  Substance and Sexual Activity   Alcohol use: Not Currently    Comment: occassionally    Drug use: No   Sexual activity: Not on file  Other Topics Concern   Not on file  Social History Narrative   Lives with her husband, Jody Wilson, and has two grown sons and two  grand sons.   She visits family and has a once weekly outing with a friend for socializing.  She enjoys making beaded jewelry, reading and walking.   Social Determinants of Health   Financial Resource Strain: Low Risk  (12/24/2022)   Overall Financial Resource Strain (CARDIA)    Difficulty of Paying Living Expenses: Not hard at all  Food Insecurity: No Food Insecurity (12/24/2022)   Hunger Vital Sign    Worried About Running Out of Food in the Last Year: Never true    Ran Out of Food in the Last Year: Never true  Transportation Needs: No Transportation Needs (12/24/2022)   PRAPARE - Hydrologist (Medical): No    Lack of Transportation (Non-Medical): No  Physical Activity: Inactive (12/24/2022)   Exercise Vital Sign    Days of Exercise per Week: 0 days    Minutes of Exercise per Session: 0 min  Stress: No Stress Concern Present (12/24/2022)   [place]    Feeling of Stress : Not at all  Social Connections: [place] (12/24/2022)   Social Connection and Isolation Panel [NHANES]    Frequency of Communication with Friends and Family: More than three times a week    Frequency of Social Gatherings with Friends and Family: More than three times a week    Attends Religious Services: More than 4 times per year    Active Member of [employer] or Organizations: Yes    Attends Music therapist: More than 4 times per year    Marital Status: Married  Human resources officer Violence: Not At Risk (12/24/2022)   Humiliation, Afraid, Rape, and Kick questionnaire    Fear of Current or Ex-Partner: No    Emotionally Abused: No    Physically Abused: No    Sexually Abused: No    FAMILY HISTORY:  Family History  Problem Relation Age of Onset   Heart disease Mother    Hypertension Mother    Hyperlipidemia Mother    Diabetes Mother    Congestive Heart Failure Mother    Aneurysm Paternal Uncle    Heart  attack Maternal Grandmother    Heart attack Maternal Grandfather    Hypertension Son    Other Son 28       Kienbock's Disease   Liver disease Son 43       Gilbert's Disease   Lupus Half-Sister    Atrial fibrillation Half-Sister    Breast cancer Niece     CURRENT MEDICATIONS:  Outpatient Encounter Medications as of 01/07/2023  Medication Sig   ALPRAZolam (XANAX) 0.5 MG tablet Take 1 tablet (0.5 mg total) by mouth 2 (two) times daily as needed for anxiety.   aspirin EC 81 MG tablet Take 1 tablet (81 mg total) by mouth daily. Swallow whole.   calcium-vitamin D (OSCAL WITH D) 500-200 MG-UNIT tablet Take 1 tablet by mouth daily with lunch.    citalopram (CELEXA) 40 MG tablet Take 1 tablet (40 mg total) by  mouth daily.   clobetasol (TEMOVATE) 0.05 % external solution Apply topically 2 (two) times daily as needed.   cyanocobalamin (VITAMIN B12) 1000 MCG/ML injection INJECT 1 ML INTO MUSCLE EVERY 28 DAYS   ergocalciferol (VITAMIN D2) 1.25 MG (50000 UT) capsule Take 1 capsule (50,000 Units total) by mouth once a week.   ezetimibe (ZETIA) 10 MG tablet Take 1 tablet (10 mg total) by mouth daily.   folic acid (FOLVITE) 1 MG tablet Take 1 mg by mouth daily.   hydrochlorothiazide (MICROZIDE) 12.5 MG capsule Take 1 capsule (12.5 mg total) by mouth daily.   levETIRAcetam (KEPPRA) 250 MG tablet Take 250 mg by mouth at bedtime.   Multiple Vitamins-Minerals (WOMENS 50+ MULTI VITAMIN/MIN PO) Take 1 tablet by mouth every Monday, Tuesday, Wednesday, Thursday, and Friday.    olmesartan (BENICAR) 40 MG tablet Take 1 tablet (40 mg total) by mouth daily. (Patient taking differently: Take 40 mg by mouth daily. Pt takes '20mg'$  in am and 20 mg in PM)   pantoprazole (PROTONIX) 40 MG tablet Take 1 tablet (40 mg total) by mouth daily.   pravastatin (PRAVACHOL) 40 MG tablet Take 1 tablet (40 mg total) by mouth every evening.   albuterol (VENTOLIN HFA) 108 (90 Base) MCG/ACT inhaler 1 puff as needed Inhalation every 4 hrs    No facility-administered encounter medications on file as of 01/07/2023.    ALLERGIES:  Allergies  Allergen Reactions   Tetracyclines & Related Itching and Swelling    PHYSICAL EXAM:    ECOG PERFORMANCE STATUS: 1 - Symptomatic but completely ambulatory  Physical Exam Constitutional:      Comments: Limited d/t telephone viist. Alert. Full sentences. No apparent distress.     LABORATORY DATA:  I have reviewed the labs as listed.  CBC    Component Value Date/Time   WBC 6.6 12/30/2022 1422   WBC 5.2 12/10/2020 1011   RBC 4.37 12/30/2022 1422   RBC 4.42 12/10/2020 1011   HGB 15.3 12/30/2022 1422   HCT 43.9 12/30/2022 1422   PLT 294 12/30/2022 1422   MCV 101 (H) 12/30/2022 1422   MCH 35.0 (H) 12/30/2022 1422   MCH 36.0 (H) 12/10/2020 1011   MCHC 34.9 12/30/2022 1422   MCHC 34.9 12/10/2020 1011   RDW 12.6 12/30/2022 1422   LYMPHSABS 1.9 12/30/2022 1422   MONOABS 0.5 12/10/2020 1011   EOSABS 0.1 12/30/2022 1422   BASOSABS 0.0 12/30/2022 1422      Latest Ref Rng & Units 12/30/2022    2:22 PM 07/01/2022   12:15 PM 10/22/2021    2:58 PM  CMP  Glucose 70 - 99 mg/dL 103  107  105   BUN 8 - 27 mg/dL '10  13  11   '$ Creatinine 0.57 - 1.00 mg/dL 0.73  0.76  0.82   Sodium 134 - 144 mmol/L 142  141  141   Potassium 3.5 - 5.2 mmol/L 3.9  4.2  3.6   Chloride 96 - 106 mmol/L 100  101  99   CO2 20 - 29 mmol/L '27  26  23   '$ Calcium 8.7 - 10.3 mg/dL 9.6  9.5  9.9   Total Protein 6.0 - 8.5 g/dL 6.8  6.6  6.8   Total Bilirubin 0.0 - 1.2 mg/dL 0.8  0.7  0.7   Alkaline Phos 44 - 121 IU/L 92  132  110   AST 0 - 40 IU/L 26  36  39   ALT 0 - 32 IU/L 15  34  22    Iron/TIBC/Ferritin/ %Sat    Component Value Date/Time   IRON 131 12/29/2022 1318   TIBC 297 12/29/2022 1318   FERRITIN 62 12/29/2022 1318   IRONPCTSAT 44 12/29/2022 1318     DIAGNOSTIC IMAGING:  I have independently reviewed the relevant imaging and discussed with the patient.  ASSESSMENT & PLAN: 1.  History of  normocytic anemia- etiology unclear. Colonoscopy 2021 was unremarkable. She had egd with [facility] in [place]. Last receive feraheme 03/17/20. Would not recommend oral iron given ongoing erythrocytosis however, iron and ferritin is well replenished. Monitor.    2.  Erythrocytosis- likely secondary etiology. MRN workup in 2022 showed normal EPO, negative JAK2, CALR, MPL. Current smoker. CO wasn't checked but expect it to be elevated. No hx of blood clots or symptoms. We reviewed that reduction of cigarettes or cessation will likely lead to normalized hemoglobin. Could also consider therapeutic phlebotomy. Hemoglobin today is 15.3. Monitor.    3.  Vitamin D deficiency: Chronic. Long hx of deficiency. Recommend goal level of > 50. Currently improved to 49.5. Continue vit d 50,000 units weekly with daily otc capsule.    4.  Vitamin B12 deficiency: likely dietary. Discussed goal > 500. She continues home b12 injections monthly. Today, b12 574. Continue home injections.    5.  Folic acid deficiency: likely dietary. Initially diagnosed 02/2020. Well replenished. She can reduce folate supplementation to 1/2 tab daily.    6.  Tobacco abuse: continue ldct screening. She has scan scheduled for 01/2023. Will defer to her pcp for ongoing screening and cessation strategies.   7. Macrocytosis- chronic for at least last 3 years. Not worse. Monitor. Likely related to ongoing b12 and folate deficiencies.   PLAN SUMMARY & DISPOSITION: 6 mo- labs (cbc, cmp, ferritin, iron studies, vit d, vit b12, folate) See Rebekah, PA a few days    I discussed the assessment and treatment plan with the patient. The patient was provided an opportunity to ask questions and all were answered. The patient agreed with the plan and demonstrated an understanding of the instructions.   The patient was advised to call back or seek an in-person evaluation if the symptoms worsen or if the condition fails to improve as anticipated.   I spent 25  minutes on this telephone visit.  Beckey Rutter, DNP, AGNP-C [place] [phone]

## 2023-01-13 ENCOUNTER — Other Ambulatory Visit: Payer: Self-pay | Admitting: Family Medicine

## 2023-01-13 DIAGNOSIS — F32A Depression, unspecified: Secondary | ICD-10-CM

## 2023-02-09 ENCOUNTER — Other Ambulatory Visit: Payer: Self-pay

## 2023-02-09 ENCOUNTER — Emergency Department (HOSPITAL_COMMUNITY): Payer: Medicare Other

## 2023-02-09 ENCOUNTER — Encounter (HOSPITAL_COMMUNITY): Payer: Self-pay | Admitting: *Deleted

## 2023-02-09 ENCOUNTER — Inpatient Hospital Stay (HOSPITAL_COMMUNITY)
Admission: EM | Admit: 2023-02-09 | Discharge: 2023-02-14 | DRG: 522 | Disposition: A | Discharge status: Skilled Nursing Facility | Payer: Medicare Other | Attending: Internal Medicine | Admitting: Internal Medicine

## 2023-02-09 ENCOUNTER — Ambulatory Visit (HOSPITAL_COMMUNITY): Payer: Medicare Other

## 2023-02-09 DIAGNOSIS — E878 Other disorders of electrolyte and fluid balance, not elsewhere classified: Secondary | ICD-10-CM | POA: Diagnosis present

## 2023-02-09 DIAGNOSIS — E785 Hyperlipidemia, unspecified: Secondary | ICD-10-CM | POA: Diagnosis present

## 2023-02-09 DIAGNOSIS — Z720 Tobacco use: Secondary | ICD-10-CM | POA: Diagnosis not present

## 2023-02-09 DIAGNOSIS — Z8249 Family history of ischemic heart disease and other diseases of the circulatory system: Secondary | ICD-10-CM | POA: Diagnosis not present

## 2023-02-09 DIAGNOSIS — Z803 Family history of malignant neoplasm of breast: Secondary | ICD-10-CM | POA: Diagnosis not present

## 2023-02-09 DIAGNOSIS — M810 Age-related osteoporosis without current pathological fracture: Secondary | ICD-10-CM | POA: Diagnosis present

## 2023-02-09 DIAGNOSIS — W010XXA Fall on same level from slipping, tripping and stumbling without subsequent striking against object, initial encounter: Secondary | ICD-10-CM | POA: Diagnosis present

## 2023-02-09 DIAGNOSIS — K219 Gastro-esophageal reflux disease without esophagitis: Secondary | ICD-10-CM | POA: Diagnosis present

## 2023-02-09 DIAGNOSIS — F418 Other specified anxiety disorders: Secondary | ICD-10-CM | POA: Diagnosis present

## 2023-02-09 DIAGNOSIS — Z7982 Long term (current) use of aspirin: Secondary | ICD-10-CM

## 2023-02-09 DIAGNOSIS — Z90710 Acquired absence of both cervix and uterus: Secondary | ICD-10-CM

## 2023-02-09 DIAGNOSIS — F17210 Nicotine dependence, cigarettes, uncomplicated: Secondary | ICD-10-CM | POA: Diagnosis not present

## 2023-02-09 DIAGNOSIS — F32A Depression, unspecified: Secondary | ICD-10-CM | POA: Diagnosis present

## 2023-02-09 DIAGNOSIS — S72011A Unspecified intracapsular fracture of right femur, initial encounter for closed fracture: Principal | ICD-10-CM | POA: Diagnosis present

## 2023-02-09 DIAGNOSIS — T502X5A Adverse effect of carbonic-anhydrase inhibitors, benzothiadiazides and other diuretics, initial encounter: Secondary | ICD-10-CM | POA: Diagnosis not present

## 2023-02-09 DIAGNOSIS — S72001A Fracture of unspecified part of neck of right femur, initial encounter for closed fracture: Secondary | ICD-10-CM | POA: Diagnosis present

## 2023-02-09 DIAGNOSIS — Z83438 Family history of other disorder of lipoprotein metabolism and other lipidemia: Secondary | ICD-10-CM | POA: Diagnosis not present

## 2023-02-09 DIAGNOSIS — R6889 Other general symptoms and signs: Secondary | ICD-10-CM | POA: Diagnosis not present

## 2023-02-09 DIAGNOSIS — E559 Vitamin D deficiency, unspecified: Secondary | ICD-10-CM | POA: Diagnosis not present

## 2023-02-09 DIAGNOSIS — B3731 Acute candidiasis of vulva and vagina: Secondary | ICD-10-CM | POA: Diagnosis not present

## 2023-02-09 DIAGNOSIS — Z87891 Personal history of nicotine dependence: Secondary | ICD-10-CM | POA: Diagnosis not present

## 2023-02-09 DIAGNOSIS — E871 Hypo-osmolality and hyponatremia: Secondary | ICD-10-CM | POA: Diagnosis present

## 2023-02-09 DIAGNOSIS — Y92009 Unspecified place in unspecified non-institutional (private) residence as the place of occurrence of the external cause: Secondary | ICD-10-CM | POA: Diagnosis not present

## 2023-02-09 DIAGNOSIS — W19XXXA Unspecified fall, initial encounter: Secondary | ICD-10-CM | POA: Diagnosis not present

## 2023-02-09 DIAGNOSIS — Z743 Need for continuous supervision: Secondary | ICD-10-CM | POA: Diagnosis not present

## 2023-02-09 DIAGNOSIS — R911 Solitary pulmonary nodule: Secondary | ICD-10-CM

## 2023-02-09 DIAGNOSIS — Z96641 Presence of right artificial hip joint: Secondary | ICD-10-CM | POA: Diagnosis not present

## 2023-02-09 DIAGNOSIS — J9811 Atelectasis: Secondary | ICD-10-CM | POA: Diagnosis not present

## 2023-02-09 DIAGNOSIS — K449 Diaphragmatic hernia without obstruction or gangrene: Secondary | ICD-10-CM | POA: Diagnosis not present

## 2023-02-09 DIAGNOSIS — I10 Essential (primary) hypertension: Secondary | ICD-10-CM | POA: Diagnosis present

## 2023-02-09 DIAGNOSIS — M25551 Pain in right hip: Secondary | ICD-10-CM | POA: Diagnosis not present

## 2023-02-09 DIAGNOSIS — S72001D Fracture of unspecified part of neck of right femur, subsequent encounter for closed fracture with routine healing: Secondary | ICD-10-CM | POA: Diagnosis not present

## 2023-02-09 DIAGNOSIS — J90 Pleural effusion, not elsewhere classified: Secondary | ICD-10-CM | POA: Diagnosis not present

## 2023-02-09 DIAGNOSIS — M25572 Pain in left ankle and joints of left foot: Secondary | ICD-10-CM | POA: Diagnosis not present

## 2023-02-09 DIAGNOSIS — E875 Hyperkalemia: Secondary | ICD-10-CM | POA: Diagnosis not present

## 2023-02-09 DIAGNOSIS — Z79899 Other long term (current) drug therapy: Secondary | ICD-10-CM

## 2023-02-09 DIAGNOSIS — S72009D Fracture of unspecified part of neck of unspecified femur, subsequent encounter for closed fracture with routine healing: Secondary | ICD-10-CM | POA: Diagnosis present

## 2023-02-09 DIAGNOSIS — Z833 Family history of diabetes mellitus: Secondary | ICD-10-CM

## 2023-02-09 DIAGNOSIS — I2510 Atherosclerotic heart disease of native coronary artery without angina pectoris: Secondary | ICD-10-CM

## 2023-02-09 DIAGNOSIS — D519 Vitamin B12 deficiency anemia, unspecified: Secondary | ICD-10-CM | POA: Diagnosis not present

## 2023-02-09 DIAGNOSIS — R52 Pain, unspecified: Secondary | ICD-10-CM | POA: Diagnosis not present

## 2023-02-09 DIAGNOSIS — F419 Anxiety disorder, unspecified: Secondary | ICD-10-CM | POA: Diagnosis present

## 2023-02-09 DIAGNOSIS — E876 Hypokalemia: Secondary | ICD-10-CM | POA: Diagnosis not present

## 2023-02-09 HISTORY — DX: Solitary pulmonary nodule: R91.1

## 2023-02-09 LAB — CBC WITH DIFFERENTIAL/PLATELET
Abs Immature Granulocytes: 0.02 10*3/uL (ref 0.00–0.07)
Basophils Absolute: 0 10*3/uL (ref 0.0–0.1)
Basophils Relative: 0 %
Eosinophils Absolute: 0.1 10*3/uL (ref 0.0–0.5)
Eosinophils Relative: 1 %
HCT: 42.5 % (ref 36.0–46.0)
Hemoglobin: 15.1 g/dL — ABNORMAL HIGH (ref 12.0–15.0)
Immature Granulocytes: 0 %
Lymphocytes Relative: 17 %
Lymphs Abs: 1.4 10*3/uL (ref 0.7–4.0)
MCH: 35.4 pg — ABNORMAL HIGH (ref 26.0–34.0)
MCHC: 35.5 g/dL (ref 30.0–36.0)
MCV: 99.8 fL (ref 80.0–100.0)
Monocytes Absolute: 0.5 10*3/uL (ref 0.1–1.0)
Monocytes Relative: 7 %
Neutro Abs: 6.1 10*3/uL (ref 1.7–7.7)
Neutrophils Relative %: 75 %
Platelets: 187 10*3/uL (ref 150–400)
RBC: 4.26 MIL/uL (ref 3.87–5.11)
RDW: 12.5 % (ref 11.5–15.5)
WBC: 8.1 10*3/uL (ref 4.0–10.5)
nRBC: 0 % (ref 0.0–0.2)

## 2023-02-09 LAB — PROTIME-INR
INR: 2.4 — ABNORMAL HIGH (ref 0.8–1.2)
Prothrombin Time: 25.7 seconds — ABNORMAL HIGH (ref 11.4–15.2)

## 2023-02-09 LAB — BASIC METABOLIC PANEL
Anion gap: 13 (ref 5–15)
BUN: 14 mg/dL (ref 8–23)
CO2: 24 mmol/L (ref 22–32)
Calcium: 8.8 mg/dL — ABNORMAL LOW (ref 8.9–10.3)
Chloride: 95 mmol/L — ABNORMAL LOW (ref 98–111)
Creatinine, Ser: 0.79 mg/dL (ref 0.44–1.00)
GFR, Estimated: >60 mL/min (ref >60)
Glucose, Bld: 112 mg/dL — ABNORMAL HIGH (ref 70–99)
Potassium: 2.6 mmol/L — CL (ref 3.5–5.1)
Sodium: 132 mmol/L — ABNORMAL LOW (ref 135–145)

## 2023-02-09 LAB — MAGNESIUM: Magnesium: 1.6 mg/dL — ABNORMAL LOW (ref 1.7–2.4)

## 2023-02-09 MED ORDER — FENTANYL CITRATE PF 50 MCG/ML IJ SOSY
50.0000 ug | PREFILLED_SYRINGE | Freq: Once | INTRAMUSCULAR | Status: AC
  Administered 2023-02-09: 50 ug via INTRAVENOUS
  Filled 2023-02-09: qty 1

## 2023-02-09 MED ORDER — POTASSIUM CHLORIDE IN NACL 40-0.9 MEQ/L-% IV SOLN
INTRAVENOUS | Status: DC
  Filled 2023-02-09 (×3): qty 1000

## 2023-02-09 MED ORDER — ACETAMINOPHEN 325 MG PO TABS: 650.0000 mg | ORAL_TABLET | Freq: Four times a day (QID) | ORAL | Status: DC | PRN

## 2023-02-09 MED ORDER — IRBESARTAN 150 MG PO TABS
150.0000 mg | ORAL_TABLET | Freq: Two times a day (BID) | ORAL | Status: DC
  Administered 2023-02-10 – 2023-02-14 (×9): 150 mg via ORAL
  Filled 2023-02-09 (×9): qty 1

## 2023-02-09 MED ORDER — ONDANSETRON HCL 4 MG PO TABS: 4.0000 mg | ORAL_TABLET | Freq: Four times a day (QID) | ORAL | Status: DC | PRN

## 2023-02-09 MED ORDER — MORPHINE SULFATE (PF) 2 MG/ML IV SOLN
2.0000 mg | INTRAVENOUS | Status: DC | PRN
  Administered 2023-02-09 – 2023-02-10 (×5): 2 mg via INTRAVENOUS
  Filled 2023-02-09 (×5): qty 1

## 2023-02-09 MED ORDER — POTASSIUM CHLORIDE CRYS ER 20 MEQ PO TBCR
40.0000 meq | EXTENDED_RELEASE_TABLET | Freq: Once | ORAL | Status: AC
  Administered 2023-02-09: 40 meq via ORAL
  Filled 2023-02-09: qty 2

## 2023-02-09 MED ORDER — POLYETHYLENE GLYCOL 3350 17 G PO PACK: 17.0000 g | PACK | Freq: Every day | ORAL | Status: DC | PRN

## 2023-02-09 MED ORDER — HEPARIN SODIUM (PORCINE) 5000 UNIT/ML IJ SOLN
5000.0000 [IU] | Freq: Three times a day (TID) | INTRAMUSCULAR | Status: DC
  Administered 2023-02-09 – 2023-02-14 (×14): 5000 [IU] via SUBCUTANEOUS
  Filled 2023-02-09 (×14): qty 1

## 2023-02-09 MED ORDER — IRBESARTAN 150 MG PO TABS
150.0000 mg | ORAL_TABLET | Freq: Every day | ORAL | Status: DC
  Administered 2023-02-09: 150 mg via ORAL
  Filled 2023-02-09: qty 1

## 2023-02-09 MED ORDER — ACETAMINOPHEN 650 MG RE SUPP: 650.0000 mg | Freq: Four times a day (QID) | RECTAL | Status: DC | PRN

## 2023-02-09 MED ORDER — PRAVASTATIN SODIUM 40 MG PO TABS
40.0000 mg | ORAL_TABLET | Freq: Every evening | ORAL | Status: DC
  Administered 2023-02-09 – 2023-02-14 (×6): 40 mg via ORAL
  Filled 2023-02-09 (×6): qty 1

## 2023-02-09 MED ORDER — ONDANSETRON HCL 4 MG/2ML IJ SOLN
4.0000 mg | Freq: Four times a day (QID) | INTRAMUSCULAR | Status: DC | PRN
  Administered 2023-02-10: 4 mg via INTRAVENOUS
  Filled 2023-02-09: qty 2

## 2023-02-09 MED ORDER — ALPRAZOLAM 0.5 MG PO TABS
0.5000 mg | ORAL_TABLET | Freq: Two times a day (BID) | ORAL | Status: DC | PRN
  Administered 2023-02-09 – 2023-02-13 (×6): 0.5 mg via ORAL
  Filled 2023-02-09 (×6): qty 1

## 2023-02-09 MED ORDER — MAGNESIUM SULFATE 2 GM/50ML IV SOLN
2.0000 g | Freq: Once | INTRAVENOUS | Status: AC
  Administered 2023-02-09: 2 g via INTRAVENOUS
  Filled 2023-02-09: qty 50

## 2023-02-09 MED ORDER — POTASSIUM CHLORIDE 10 MEQ/100ML IV SOLN
10.0000 meq | INTRAVENOUS | Status: AC
  Administered 2023-02-09 (×3): 10 meq via INTRAVENOUS
  Filled 2023-02-09 (×3): qty 100

## 2023-02-09 MED ORDER — PANTOPRAZOLE SODIUM 40 MG PO TBEC
40.0000 mg | DELAYED_RELEASE_TABLET | Freq: Every day | ORAL | Status: DC
  Administered 2023-02-10 – 2023-02-14 (×5): 40 mg via ORAL
  Filled 2023-02-09 (×5): qty 1

## 2023-02-09 MED ORDER — FENTANYL CITRATE PF 50 MCG/ML IJ SOSY
25.0000 ug | PREFILLED_SYRINGE | Freq: Once | INTRAMUSCULAR | Status: AC
  Administered 2023-02-09: 25 ug via INTRAVENOUS
  Filled 2023-02-09: qty 1

## 2023-02-09 MED ORDER — CITALOPRAM HYDROBROMIDE 20 MG PO TABS
40.0000 mg | ORAL_TABLET | Freq: Every day | ORAL | Status: DC
  Administered 2023-02-10 – 2023-02-14 (×5): 40 mg via ORAL
  Filled 2023-02-09 (×5): qty 2

## 2023-02-09 MED ORDER — HYDRALAZINE HCL 20 MG/ML IJ SOLN
10.0000 mg | INTRAMUSCULAR | Status: DC | PRN
  Administered 2023-02-14: 10 mg via INTRAVENOUS
  Filled 2023-02-09: qty 1

## 2023-02-09 NOTE — Assessment & Plan Note (Addendum)
Elevated, blood pressure 196/113. -Resume  telmisartan -Hold HCTZ to allow for hydration  -IV hydralazine 10 mg for systolic greater than [id number]

## 2023-02-09 NOTE — Assessment & Plan Note (Signed)
Incidental finding on chest x-ray 02/09/2023- Nodule in the RIGHT upper lobe measuring approximately 7 mm may be new from previous chest radiographs though could be a portion of scarring that is seen in the upper chest. Consider short interval PA and lateral chest radiograph when the patient is able. If persistent CT may be warranted. - F/u as outpatient

## 2023-02-09 NOTE — Assessment & Plan Note (Signed)
Smokes a pack of cigarettes daily. -Advised smoking cessation.

## 2023-02-09 NOTE — ED Triage Notes (Signed)
Pt states she tripped over her shoe Monday night and fell landing on her right hip; pt has pain in the groin area radiating around to back of hip  No bruising noted or obvious injury

## 2023-02-09 NOTE — ED Provider Notes (Signed)
[facility] Provider Note   CSN: [id number] Arrival date & time: 02/09/23  1450     History  Chief Complaint  Patient presents with   Hip Pain    Jody Wilson is a 72 y.o. female.   Hip Pain Pertinent negatives include no chest pain and no abdominal pain.       Jody Wilson is a 72 y.o. female with past medical history of hypertension, hyperlipidemia and anemia who presents to the Emergency Department complaining of right hip pain secondary to mechanical fall that occurred 2 days ago.  States she tripped over a broken flip-flop causing her to fall on her right hip.  She has had pain of her right groin since the fall pain now radiates to the posterior hip area.  She is been unable to tolerate weightbearing of her right leg.  She denies head injury, low back pain, LOC abdominal pain and neck pain.  She takes 81 mg aspirin, but does not take blood thinners   Home Medications Prior to Admission medications   Medication Sig Start Date End Date Taking? Authorizing Provider  albuterol (VENTOLIN HFA) 108 (90 Base) MCG/ACT inhaler 1 puff as needed Inhalation every 4 hrs    [provider]  ALPRAZolam (XANAX) 0.5 MG tablet Take 1 tablet (0.5 mg total) by mouth 2 (two) times daily as needed for anxiety. 12/30/22   Dettinger, Fransisca Kaufmann, MD  aspirin EC 81 MG tablet Take 1 tablet (81 mg total) by mouth daily. Swallow whole. 07/29/20   Tolia, Sunit, DO  calcium-vitamin D (OSCAL WITH D) 500-200 MG-UNIT tablet Take 1 tablet by mouth daily with lunch.     [provider]  citalopram (CELEXA) 40 MG tablet TAKE ONE TABLET ONCE DAILY 01/13/23   Dettinger, Fransisca Kaufmann, MD  clobetasol (TEMOVATE) 0.05 % external solution Apply topically 2 (two) times daily as needed. 06/04/22   [provider]  cyanocobalamin (VITAMIN B12) 1000 MCG/ML injection INJECT 1 ML INTO MUSCLE EVERY 28 DAYS 11/25/22   Derek Jack, MD  ergocalciferol  (VITAMIN D2) 1.25 MG (50000 UT) capsule Take 1 capsule (50,000 Units total) by mouth once a week. 01/07/23   Verlon Au, NP  ezetimibe (ZETIA) 10 MG tablet Take 1 tablet (10 mg total) by mouth daily. 12/30/22   Dettinger, Fransisca Kaufmann, MD  folic acid (FOLVITE) 1 MG tablet Take 1 mg by mouth daily.    [provider]  hydrochlorothiazide (MICROZIDE) 12.5 MG capsule Take 1 capsule (12.5 mg total) by mouth daily. 05/27/22   Dettinger, Fransisca Kaufmann, MD  levETIRAcetam (KEPPRA) 250 MG tablet Take 250 mg by mouth at bedtime.    [provider]  Multiple Vitamins-Minerals (WOMENS 50+ MULTI VITAMIN/MIN PO) Take 1 tablet by mouth every Monday, Tuesday, Wednesday, Thursday, and Friday.     [provider]  olmesartan (BENICAR) 40 MG tablet Take 1 tablet (40 mg total) by mouth daily. Patient taking differently: Take 40 mg by mouth daily. Pt takes 13m in am and 20 mg in PM 05/27/22   Dettinger, JFransisca Kaufmann MD  pantoprazole (PROTONIX) 40 MG tablet Take 1 tablet (40 mg total) by mouth daily. 05/27/22   Dettinger, JFransisca Kaufmann MD  pravastatin (PRAVACHOL) 40 MG tablet Take 1 tablet (40 mg total) by mouth every evening. 01/22/22   Dettinger, JFransisca Kaufmann MD      Allergies    Tetracyclines & related    Review of Systems  Review of Systems  Constitutional:  Negative for chills and fever.  Respiratory:  Negative for cough.   Cardiovascular:  Negative for chest pain.  Gastrointestinal:  Negative for abdominal pain, nausea and vomiting.  Musculoskeletal:  Positive for arthralgias (right hip pain). Negative for back pain and neck pain.  Skin:  Negative for color change and wound.  Neurological:  Negative for dizziness, weakness and numbness.    Physical Exam Updated Vital Signs BP (!) 196/113 (BP Location: Right Arm)   Pulse 63   Temp 97.8 F (36.6 C) (Oral)   Resp 20   Ht 5' 5"$  (1.651 m)   Wt 66.2 kg   SpO2 96%   BMI 24.30 kg/m  Physical Exam Vitals and nursing note reviewed.   Constitutional:      General: She is not in acute distress.    Appearance: Normal appearance. She is not toxic-appearing.  Cardiovascular:     Rate and Rhythm: Normal rate and regular rhythm.     Pulses: Normal pulses.  Pulmonary:     Effort: Pulmonary effort is normal.  Abdominal:     General: There is no distension.     Palpations: Abdomen is soft.     Tenderness: There is no abdominal tenderness.  Musculoskeletal:        General: Tenderness and signs of injury present. No swelling.     Comments: Ttp right anterior hip.  Right leg slightly shortened and externally rotated.    Skin:    General: Skin is warm.     Capillary Refill: Capillary refill takes less than 2 seconds.     Findings: No bruising or erythema.  Neurological:     General: No focal deficit present.     Mental Status: She is alert.     Sensory: No sensory deficit.     Motor: No weakness.     ED Results / Procedures / Treatments   Labs (all labs ordered are listed, but only abnormal results are displayed) Labs Reviewed  CBC WITH DIFFERENTIAL/PLATELET - Abnormal; Notable for the following components:      Result Value   Hemoglobin 15.1 (*)    MCH 35.4 (*)    All other components within normal limits  BASIC METABOLIC PANEL - Abnormal; Notable for the following components:   Sodium 132 (*)    Potassium 2.6 (*)    Chloride 95 (*)    Glucose, Bld 112 (*)    Calcium 8.8 (*)    All other components within normal limits  PROTIME-INR    EKG EKG Interpretation  Date/Time:  Wednesday February 09 2023 18:45:22 EST Ventricular Rate:  69 PR Interval:  129 QRS Duration: 88 QT Interval:  427 QTC Calculation: 458 R Axis:   67 Text Interpretation: Sinus rhythm Borderline repolarization abnormality Confirmed by Godfrey Pick [phone]) on 02/09/2023 6:49:45 PM  Radiology DG Hip Unilat W or Wo Pelvis 2-3 Views Right  Result Date: 02/09/2023 CLINICAL DATA:  Recent trip and fall 2 days ago with right hip pain, initial  encounter EXAM: DG HIP (WITH OR WITHOUT PELVIS) 3V RIGHT COMPARISON:  None Available. FINDINGS: Pelvic ring is intact. Subcapital right femoral neck fracture is noted with impaction and angulation at the fracture site. No acute soft tissue abnormality is noted. IMPRESSION: Subcapital right femoral neck fracture. Electronically Signed   By: Inez Catalina M.D.   On: 02/09/2023 17:26    Procedures Procedures    Medications Ordered in ED Medications  potassium chloride 10 mEq in  100 mL IVPB (10 mEq Intravenous New Bag/Given 02/09/23 1846)  irbesartan (AVAPRO) tablet 150 mg (150 mg Oral Given 02/09/23 1844)  fentaNYL (SUBLIMAZE) injection 50 mcg (50 mcg Intravenous Given 02/09/23 1654)  fentaNYL (SUBLIMAZE) injection 25 mcg (25 mcg Intravenous Given 02/09/23 1844)  potassium chloride SA (KLOR-CON M) CR tablet 40 mEq (40 mEq Oral Given 02/09/23 1844)    ED Course/ Medical Decision Making/ A&P                             Medical Decision Making Patient here for evaluation of right hip pain secondary to mechanical fall 2 days ago.  Fell landing on her right hip.  Has been unable to bear weight since then without significant pain.  No head injury or LOC.  She does not take blood thinners.  Clinically, suspect this is a hip fracture.  Dislocation, sprain strain also considered but felt less likely.    Amount and/or Complexity of Data Reviewed Labs: ordered.    Details: Labs interpreted by me, no evidence of leukocytosis.  Patient is hypokalemic with potassium of 2.6.  She does take hydrochlorothiazide which is the likely cause of her hypokalemia INR 2.4 Radiology: ordered.    Details: X-ray of the right hip shows subcapital right femoral neck fracture ECG/medicine tests: ordered.    Details: EKG shows sinus rhythm Discussion of management or test interpretation with external provider(s): Patient with hip fracture, consulted orthopedics Dr.Carins, who recommends hospital admission with likely surgery  on Friday.  Patient also hypokalemic and hypertensive.  Hydrochlorothiazide likely cause of her hypokalemia. She also takes Benicar twice daily has not had her evening dose.  Given dose here.  Oral and IV potassium also ordered.  Discussed findings with [facility] hospitalist, Dr. Denton Brick who agrees to admit.    Risk Prescription drug management.           Final Clinical Impression(s) / ED Diagnoses Final diagnoses:  Closed fracture of right hip, initial encounter [facility])  Hypokalemia    Rx / DC Orders ED Discharge Orders     None         Kem Parkinson, PA-C 02/09/23 1851    Godfrey Pick, MD 02/10/23 [phone]

## 2023-02-09 NOTE — Assessment & Plan Note (Addendum)
Status post mechanical fall.  She tripped over footwear.  Pelvic X-ray shows subcapital right femoral neck fracture. - EDP talked to Dr. Lenard Lance likely to be 2/23. -IV morphine 2 mg every 4 hours as needed - N/s + 40 kcl 100cc/hr x 20 hrs

## 2023-02-09 NOTE — H&P (Signed)
History and Physical    Jody Wilson [id number] DOB: 03-25-1951 DOA: 02/09/2023  PCP: Dettinger, Fransisca Kaufmann, MD   Patient coming from: Home  I have personally briefly reviewed patient's old medical records in [place]  Chief Complaint: Fall  HPI: Jody Wilson is a 72 y.o. female with medical history significant for hypertension.  Patient presented to ED with complaints of a fall and right hip pain.  She reports she tripped over a flip-flop 2 days ago falling on her right hip.  She did not hit her head.  She did not lose consciousness. Since the fall she has not been able to bear weight on her right leg.  ED Course: Blood pressure elevated 196/113.  Potassium 2.6.  Magnesium 1.6.  Chest X-ray-increased interstitial movements of uncertain significance, could be chronic, difficult to exclude mild bronchopneumonia or pneumonitis, pulmonary nodule.  Pelvic x-ray-subcapital right femoral neck fracture. EDP to Dr. Amedeo Kinsman, will see in consult in a.m., surgery likely to be 2/23.  Review of Systems: As per HPI all other systems reviewed and negative.  Past Medical History:  Diagnosis Date   Anemia    Anxiety    Aortic atherosclerosis (HCC)    Depression    GERD (gastroesophageal reflux disease)    Hyperlipidemia    Hypertension    Orthostatic hypotension    Osteoporosis     Past Surgical History:  Procedure Laterality Date   ABDOMINAL HYSTERECTOMY     BACK SURGERY     CHOLECYSTECTOMY     FOOT SURGERY     7 in the past   IR GENERIC HISTORICAL  12/09/2016   IR RADIOLOGIST EVAL & MGMT 12/09/2016 [facility]   IR GENERIC HISTORICAL  12/10/2016   IR VERTEBROPLASTY CERV/THOR BX INC UNI/BIL INC/INJECT/IMAGING 12/10/2016 [facility]   LEFT HEART CATH AND CORONARY ANGIOGRAPHY N/A 09/30/2020   Procedure: LEFT HEART CATH AND CORONARY ANGIOGRAPHY;  Surgeon: Nigel Mormon, MD;  Location: [facility];  Service: Cardiovascular;  Laterality: N/A;   [place]     reports that she has quit smoking. Her smoking use included cigarettes. She smoked an average of .25 packs per day. She has never been exposed to tobacco smoke. She has never used smokeless tobacco. She reports that she does not currently use alcohol. She reports that she does not use drugs.  Allergies  Allergen Reactions   Tetracyclines & Related Itching and Swelling    Family History  Problem Relation Age of Onset   Heart disease Mother    Hypertension Mother    Hyperlipidemia Mother    Diabetes Mother    Congestive Heart Failure Mother    Aneurysm Paternal Uncle    Heart attack Maternal Grandmother    Heart attack Maternal Grandfather    Hypertension Son    Other Son 53       Kienbock's Disease   Liver disease Son 24       Gilbert's Disease   Lupus Half-Sister    Atrial fibrillation Half-Sister    Breast cancer Niece     Prior to Admission medications   Medication Sig Start Date End Date Taking? Authorizing Provider  albuterol (VENTOLIN HFA) 108 (90 Base) MCG/ACT inhaler 1 puff as needed Inhalation every 4 hrs    [provider]  ALPRAZolam (XANAX) 0.5 MG tablet Take 1 tablet (0.5 mg total) by mouth 2 (two) times daily as needed for anxiety. 12/30/22   Dettinger, Fransisca Kaufmann,  MD  aspirin EC 81 MG tablet Take 1 tablet (81 mg total) by mouth daily. Swallow whole. 07/29/20   Tolia, Sunit, DO  calcium-vitamin D (OSCAL WITH D) 500-200 MG-UNIT tablet Take 1 tablet by mouth daily with lunch.     [provider]  citalopram (CELEXA) 40 MG tablet TAKE ONE TABLET ONCE DAILY 01/13/23   Dettinger, Fransisca Kaufmann, MD  clobetasol (TEMOVATE) 0.05 % external solution Apply topically 2 (two) times daily as needed. 06/04/22   [provider]  cyanocobalamin (VITAMIN B12) 1000 MCG/ML injection INJECT 1 ML INTO MUSCLE EVERY 28 DAYS 11/25/22   Derek Jack, MD  ergocalciferol (VITAMIN D2) 1.25 MG (50000 UT) capsule Take 1 capsule (50,000 Units total)  by mouth once a week. 01/07/23   Verlon Au, NP  ezetimibe (ZETIA) 10 MG tablet Take 1 tablet (10 mg total) by mouth daily. 12/30/22   Dettinger, Fransisca Kaufmann, MD  folic acid (FOLVITE) 1 MG tablet Take 1 mg by mouth daily.    [provider]  hydrochlorothiazide (MICROZIDE) 12.5 MG capsule Take 1 capsule (12.5 mg total) by mouth daily. 05/27/22   Dettinger, Fransisca Kaufmann, MD  levETIRAcetam (KEPPRA) 250 MG tablet Take 250 mg by mouth at bedtime.    [provider]  Multiple Vitamins-Minerals (WOMENS 50+ MULTI VITAMIN/MIN PO) Take 1 tablet by mouth every Monday, Tuesday, Wednesday, Thursday, and Friday.     [provider]  olmesartan (BENICAR) 40 MG tablet Take 1 tablet (40 mg total) by mouth daily. Patient taking differently: Take 40 mg by mouth daily. Pt takes 67m in am and 20 mg in PM 05/27/22   Dettinger, JFransisca Kaufmann MD  pantoprazole (PROTONIX) 40 MG tablet Take 1 tablet (40 mg total) by mouth daily. 05/27/22   Dettinger, JFransisca Kaufmann MD  pravastatin (PRAVACHOL) 40 MG tablet Take 1 tablet (40 mg total) by mouth every evening. 01/22/22   Dettinger, JFransisca Kaufmann MD    Physical Exam: Vitals:   02/09/23 1500 02/09/23 1502  BP: (!) 196/113 (!) 196/113  Pulse: (!) 55 63  Resp: 20 20  Temp: 97.8 F (36.6 C) 97.8 F (36.6 C)  TempSrc:  Oral  SpO2: 93% 96%  Weight: 66.2 kg   Height: 5' 5"$  (1.651 m)     Constitutional: NAD, calm, comfortable Vitals:   02/09/23 1500 02/09/23 1502  BP: (!) 196/113 (!) 196/113  Pulse: (!) 55 63  Resp: 20 20  Temp: 97.8 F (36.6 C) 97.8 F (36.6 C)  TempSrc:  Oral  SpO2: 93% 96%  Weight: 66.2 kg   Height: 5' 5"$  (1.651 m)    Eyes: PERRL, lids and conjunctivae normal ENMT: Mucous membranes are moist.   Neck: normal, supple, no masses, no thyromegaly Respiratory: clear to auscultation bilaterally, no wheezing, no crackles. Normal respiratory effort. No accessory muscle use.  Cardiovascular: Regular rate and rhythm, no murmurs / rubs / gallops. No  extremity edema. 2+ pedal pulses. No carotid bruits.  Abdomen: no tenderness, no masses palpated. No hepatosplenomegaly. Bowel sounds positive.  Musculoskeletal: no clubbing / cyanosis. No joint deformity upper and lower extremities. Skin: no rashes, lesions, ulcers. No induration Neurologic: No facial asymmetry, no aphasia, moving extremities spontaneously. Psychiatric: Normal judgment and insight. Alert and oriented x 3. Normal mood.   Labs on Admission: I have personally reviewed following labs and imaging studies  CBC: Recent Labs  Lab 02/09/23 1650  WBC 8.1  NEUTROABS 6.1  HGB 15.1*  HCT 42.5  MCV 99.8  PLT 187  Basic Metabolic Panel: Recent Labs  Lab 02/09/23 1650  NA 132*  K 2.6*  CL 95*  CO2 24  GLUCOSE 112*  BUN 14  CREATININE 0.79  CALCIUM 8.8*   Coagulation Profile: Recent Labs  Lab 02/09/23 1650  INR 2.4*    Radiological Exams on Admission: DG Chest Portable 1 View  Result Date: 02/09/2023 CLINICAL DATA:  72 year old female presenting for evaluation of fall with RIGHT hip fracture, preoperative evaluation. EXAM: PORTABLE CHEST 1 VIEW COMPARISON:  Imaging from November 02, 2022 and July of 2023. FINDINGS: Cardiomediastinal contours and hilar structures are stable with signs of large hiatal hernia. Heart size accentuated by AP projection. Biapical pleural and parenchymal scarring. No signs of consolidation. No pleural effusion. Nodule in the RIGHT upper lobe measuring approximately 7 mm may be new from previous chest radiographs though could be a portion of scarring that is seen in the upper chest. Mild interstitial prominence component of which is chronic and likely accentuated by lower lung volumes than on the prior study. Post cement augmentation of the mid to lower thoracic spine. On limited assessment no acute skeletal process. IMPRESSION: 1. Increased interstitial prominence compared to previous imaging is of uncertain significance. A component of this could  be chronic but it is difficult to exclude mild bronchopneumonia or pneumonitis. Correlate with any respiratory symptoms. 2. Nodule in the RIGHT upper lobe measuring approximately 7 mm may be new from previous chest radiographs though could be a portion of scarring that is seen in the upper chest. Consider short interval PA and lateral chest radiograph when the patient is able. If persistent CT may be warranted. 3. Large hiatal hernia. 4. Biapical pleural and parenchymal scarring. Electronically Signed   By: Zetta Bills M.D.   On: 02/09/2023 18:26   DG Hip Unilat W or Wo Pelvis 2-3 Views Right  Result Date: 02/09/2023 CLINICAL DATA:  Recent trip and fall 2 days ago with right hip pain, initial encounter EXAM: DG HIP (WITH OR WITHOUT PELVIS) 3V RIGHT COMPARISON:  None Available. FINDINGS: Pelvic ring is intact. Subcapital right femoral neck fracture is noted with impaction and angulation at the fracture site. No acute soft tissue abnormality is noted. IMPRESSION: Subcapital right femoral neck fracture. Electronically Signed   By: Inez Catalina M.D.   On: 02/09/2023 17:26    EKG: Independently reviewed.  Sinus rhythm, rate 69.  QTc 458.  No significant change from prior.  Assessment/Plan Principal Problem:   Closed right hip fracture (HCC) Active Problems:   Electrolyte abnormality   Hypertension   Tobacco abuse   Anxiety and depression   Pulmonary nodule    Assessment and Plan: * Closed right hip fracture (HCC) Status post mechanical fall.  She tripped over footwear.  Pelvic X-ray shows subcapital right femoral neck fracture. - EDP talked to Dr. Lenard Lance likely to be 2/23. -IV morphine 2 mg every 4 hours as needed - N/s + 40 kcl 100cc/hr x 20 hrs  Electrolyte abnormality Hypokalemia 2.6, hypomagnesemia 1.6, Hyponatremia 132.  She is on HCTZ, Olmersartan. -Replete electrolytes  Pulmonary nodule Incidental finding on chest x-ray 02/09/2023- Nodule in the RIGHT upper lobe measuring  approximately 7 mm may be new from previous chest radiographs though could be a portion of scarring that is seen in the upper chest. Consider short interval PA and lateral chest radiograph when the patient is able. If persistent CT may be warranted. - F/u as outpatient  Tobacco abuse Smokes a pack of cigarettes daily. -Advised smoking  cessation.  Hypertension Elevated, blood pressure 196/113. -Resume  telmisartan -Hold HCTZ to allow for hydration  -IV hydralazine 10 mg for systolic greater than [id number]   DVT prophylaxis: Heparin Code Status:  FULL  Family Communication: None at bedside. Disposition Plan:  > 2 days Consults called: Ortho Admission status: Inpt tele  I certify that at the point of admission it is my clinical judgment that the patient will require inpatient hospital care spanning beyond 2 midnights from the point of admission due to high intensity of service, high risk for further deterioration and high frequency of surveillance required.    Author: Bethena Roys, MD 02/09/2023 9:23 PM  For on call review [email or web address].

## 2023-02-09 NOTE — Assessment & Plan Note (Addendum)
Hypokalemia 2.6, hypomagnesemia 1.6, Hyponatremia 132.  She is on HCTZ, Olmersartan. -Replete electrolytes

## 2023-02-10 ENCOUNTER — Encounter (HOSPITAL_COMMUNITY): Payer: Self-pay | Admitting: Internal Medicine

## 2023-02-10 DIAGNOSIS — E878 Other disorders of electrolyte and fluid balance, not elsewhere classified: Secondary | ICD-10-CM | POA: Diagnosis not present

## 2023-02-10 DIAGNOSIS — I10 Essential (primary) hypertension: Secondary | ICD-10-CM | POA: Diagnosis not present

## 2023-02-10 DIAGNOSIS — S72001A Fracture of unspecified part of neck of right femur, initial encounter for closed fracture: Secondary | ICD-10-CM | POA: Diagnosis not present

## 2023-02-10 DIAGNOSIS — W19XXXA Unspecified fall, initial encounter: Secondary | ICD-10-CM

## 2023-02-10 DIAGNOSIS — E876 Hypokalemia: Secondary | ICD-10-CM

## 2023-02-10 DIAGNOSIS — Y92009 Unspecified place in unspecified non-institutional (private) residence as the place of occurrence of the external cause: Secondary | ICD-10-CM

## 2023-02-10 DIAGNOSIS — F419 Anxiety disorder, unspecified: Secondary | ICD-10-CM | POA: Diagnosis not present

## 2023-02-10 LAB — CBC
HCT: 40.8 % (ref 36.0–46.0)
Hemoglobin: 14.1 g/dL (ref 12.0–15.0)
MCH: 36.1 pg — ABNORMAL HIGH (ref 26.0–34.0)
MCHC: 34.6 g/dL (ref 30.0–36.0)
MCV: 104.3 fL — ABNORMAL HIGH (ref 80.0–100.0)
Platelets: 158 10*3/uL (ref 150–400)
RBC: 3.91 MIL/uL (ref 3.87–5.11)
RDW: 12.5 % (ref 11.5–15.5)
WBC: 6.5 10*3/uL (ref 4.0–10.5)
nRBC: 0 % (ref 0.0–0.2)

## 2023-02-10 LAB — BASIC METABOLIC PANEL
Anion gap: 9 (ref 5–15)
BUN: 10 mg/dL (ref 8–23)
CO2: 25 mmol/L (ref 22–32)
Calcium: 8.4 mg/dL — ABNORMAL LOW (ref 8.9–10.3)
Chloride: 102 mmol/L (ref 98–111)
Creatinine, Ser: 0.75 mg/dL (ref 0.44–1.00)
GFR, Estimated: >60 mL/min (ref >60)
Glucose, Bld: 84 mg/dL (ref 70–99)
Potassium: 4.3 mmol/L (ref 3.5–5.1)
Sodium: 136 mmol/L (ref 135–145)

## 2023-02-10 LAB — MAGNESIUM: Magnesium: 2 mg/dL (ref 1.7–2.4)

## 2023-02-10 LAB — VITAMIN D 25 HYDROXY (VIT D DEFICIENCY, FRACTURES): Vit D, 25-Hydroxy: 87.22 ng/mL (ref 30–100)

## 2023-02-10 MED ORDER — HYDROMORPHONE HCL 1 MG/ML IJ SOLN
0.5000 mg | INTRAMUSCULAR | Status: DC | PRN
  Administered 2023-02-10 – 2023-02-14 (×15): 0.5 mg via INTRAVENOUS
  Filled 2023-02-10 (×17): qty 0.5

## 2023-02-10 MED ORDER — LACTATED RINGERS IV SOLN
INTRAVENOUS | Status: DC
  Administered 2023-02-10: 1000 mL via INTRAVENOUS

## 2023-02-10 MED ORDER — OXYCODONE HCL 5 MG PO TABS
5.0000 mg | ORAL_TABLET | Freq: Four times a day (QID) | ORAL | Status: DC | PRN
  Administered 2023-02-10 – 2023-02-14 (×6): 5 mg via ORAL
  Filled 2023-02-10 (×6): qty 1

## 2023-02-10 MED ORDER — CEFAZOLIN SODIUM-DEXTROSE 2-4 GM/100ML-% IV SOLN
2.0000 g | INTRAVENOUS | Status: AC
  Administered 2023-02-11: 2 g via INTRAVENOUS

## 2023-02-10 MED ORDER — TRANEXAMIC ACID-NACL 1000-0.7 MG/100ML-% IV SOLN
1000.0000 mg | INTRAVENOUS | Status: AC
  Administered 2023-02-11: 1000 mg via INTRAVENOUS

## 2023-02-10 NOTE — Consult Note (Signed)
ORTHOPAEDIC CONSULTATION  REQUESTING PHYSICIAN: Mercy Riding, MD  ASSESSMENT AND PLAN: 72 y.o. female with the following: Right Hip Displaced femoral neck fracture  This patient requires inpatient admission to the hospitalist, to include preoperative clearance and perioperative medical management  - Weight Bearing Status/Activity: NWB Right lower extremity  - Additional recommended labs/tests: Preop Labs: CBC, BMP, PT/INR, Chest XR, and EKG  -VTE Prophylaxis: Please hold prior to OR; to resume POD#1 at the discretion of the primary team  - Pain control: Recommend PO pain medications PRN; judicious use of narcotics  - Follow-up plan: F/u 10-14 days postop  -Procedures: Plan for OR once patient has been medically optimized  Plan for Right Hip hemiarthroplasty    Patient will be n.p.o. after midnight.  Plan to proceed with surgery 02/11/2023.  Chief Complaint: Right hip pain  HPI: Jody Wilson is a 72 y.o. female who presented to the ED for evaluation after sustaining a mechanical fall.  Past medical history as listed below.  She states that she lost her balance, after tripping on her flip-flop.  She landed directly on her right hip.  She noted immediate pain.  She was unable to ambulate.  She has difficulty moving her right leg due to pain in the right hip.  No numbness or tingling.  She did not hit her head.  No pain elsewhere.    Past Medical History:  Diagnosis Date   Anemia    Anxiety    Aortic atherosclerosis (HCC)    Depression    GERD (gastroesophageal reflux disease)    Hyperlipidemia    Hypertension    Orthostatic hypotension    Osteoporosis    Past Surgical History:  Procedure Laterality Date   ABDOMINAL HYSTERECTOMY     BACK SURGERY     CHOLECYSTECTOMY     FOOT SURGERY     7 in the past   IR GENERIC HISTORICAL  12/09/2016   IR RADIOLOGIST EVAL & MGMT 12/09/2016 [facility]   IR GENERIC HISTORICAL  12/10/2016   IR VERTEBROPLASTY CERV/THOR BX INC  UNI/BIL INC/INJECT/IMAGING 12/10/2016 [facility]   LEFT HEART CATH AND CORONARY ANGIOGRAPHY N/A 09/30/2020   Procedure: LEFT HEART CATH AND CORONARY ANGIOGRAPHY;  Surgeon: Nigel Mormon, MD;  Location: [facility];  Service: Cardiovascular;  Laterality: N/A;   TRACHEOSTOMY TUBE PLACEMENT  1982   Social History   Socioeconomic History   Marital status: Married    Spouse name: Donnie   Number of children: 2   Years of education: 12   Highest education level: Associate degree: occupational, Hotel manager, or vocational program  Occupational History   Occupation: retired    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  Tobacco Use   Smoking status: Former    Packs/day: 0.25    Types: Cigarettes    Passive exposure: Never   Smokeless tobacco: Never   Tobacco comments:    Trying to cut on number of cigarettes she smokes.  Vaping Use   Vaping Use: Never used  Substance and Sexual Activity   Alcohol use: Not Currently    Comment: occassionally    Drug use: No   Sexual activity: Not on file  Other Topics Concern   Not on file  Social History Narrative   Lives with her husband, [name], and has two grown sons and two grand sons.   She visits family and has a once weekly outing with a friend for socializing.  She enjoys making beaded jewelry, reading and walking.   Social Determinants of Health   Financial Resource Strain: Low Risk  (12/24/2022)   Overall Financial Resource Strain (CARDIA)    Difficulty of Paying Living Expenses: Not hard at all  Food Insecurity: No Food Insecurity (02/10/2023)   Hunger Vital Sign    Worried About Running Out of Food in the Last Year: Never true    Ran Out of Food in the Last Year: Never true  Transportation Needs: No Transportation Needs (02/10/2023)   PRAPARE - Hydrologist (Medical): No    Lack of Transportation (Non-Medical): No  Physical Activity: Inactive (12/24/2022)    Exercise Vital Sign    Days of Exercise per Week: 0 days    Minutes of Exercise per Session: 0 min  Stress: No Stress Concern Present (12/24/2022)   [place]    Feeling of Stress : Not at all  Social Connections: [place] (12/24/2022)   Social Connection and Isolation Panel [NHANES]    Frequency of Communication with Friends and Family: More than three times a week    Frequency of Social Gatherings with Friends and Family: More than three times a week    Attends Religious Services: More than 4 times per year    Active Member of [employer] or Organizations: Yes    Attends Music therapist: More than 4 times per year    Marital Status: Married   Family History  Problem Relation Age of Onset   Heart disease Mother    Hypertension Mother    Hyperlipidemia Mother    Diabetes Mother    Congestive Heart Failure Mother    Aneurysm Paternal Uncle    Heart attack Maternal Grandmother    Heart attack Maternal Grandfather    Hypertension Son    Other Son 47       Kienbock's Disease   Liver disease Son 64       Gilbert's Disease   Lupus Half-Sister    Atrial fibrillation Half-Sister    Breast cancer Niece    Allergies  Allergen Reactions   Tetracyclines & Related Itching and Swelling   Prior to Admission medications   Medication Sig Start Date End Date Taking? Authorizing Provider  ALPRAZolam Duanne Moron) 0.5 MG tablet Take 1 tablet (0.5 mg total) by mouth 2 (two) times daily as needed for anxiety. 12/30/22  Yes Dettinger, Fransisca Kaufmann, MD  aspirin EC 81 MG tablet Take 1 tablet (81 mg total) by mouth daily. Swallow whole. 07/29/20  Yes Tolia, Sunit, DO  calcium-vitamin D (OSCAL WITH D) 500-200 MG-UNIT tablet Take 1 tablet by mouth daily with lunch.    Yes [provider]  citalopram (CELEXA) 40 MG tablet TAKE ONE TABLET ONCE DAILY 01/13/23  Yes Dettinger, Fransisca Kaufmann, MD  cyanocobalamin (VITAMIN B12) 1000  MCG/ML injection INJECT 1 ML INTO MUSCLE EVERY 28 DAYS 11/25/22  Yes Derek Jack, MD  ergocalciferol (VITAMIN D2) 1.25 MG (50000 UT) capsule Take 1 capsule (50,000 Units total) by mouth once a week. 01/07/23  Yes Verlon Au, NP  ezetimibe (ZETIA) 10 MG tablet Take 1 tablet (10 mg total) by mouth daily. 12/30/22  Yes Dettinger, Fransisca Kaufmann, MD  folic acid (FOLVITE) 1 MG tablet Take 1 mg by mouth daily.   Yes [provider]  hydrochlorothiazide (MICROZIDE) 12.5 MG capsule Take 1 capsule (12.5 mg total) by mouth daily. 05/27/22  Yes Dettinger, Vonna Kotyk  A, MD  olmesartan (BENICAR) 40 MG tablet Take 1 tablet (40 mg total) by mouth daily. Patient taking differently: Take 40 mg by mouth daily. Pt takes 7m in am and 20 mg in PM 05/27/22  Yes Dettinger, JFransisca Kaufmann MD  pantoprazole (PROTONIX) 40 MG tablet Take 1 tablet (40 mg total) by mouth daily. 05/27/22  Yes Dettinger, JFransisca Kaufmann MD  pravastatin (PRAVACHOL) 40 MG tablet Take 1 tablet (40 mg total) by mouth every evening. 01/22/22  Yes Dettinger, JFransisca Kaufmann MD  levETIRAcetam (KEPPRA) 250 MG tablet Take 250 mg by mouth at bedtime. Patient not taking: Reported on 02/09/2023    [provider]   DG Chest Portable 1 View  Result Date: 02/09/2023 CLINICAL DATA:  72year old female presenting for evaluation of fall with RIGHT hip fracture, preoperative evaluation. EXAM: PORTABLE CHEST 1 VIEW COMPARISON:  Imaging from November 02, 2022 and July of 2023. FINDINGS: Cardiomediastinal contours and hilar structures are stable with signs of large hiatal hernia. Heart size accentuated by AP projection. Biapical pleural and parenchymal scarring. No signs of consolidation. No pleural effusion. Nodule in the RIGHT upper lobe measuring approximately 7 mm may be new from previous chest radiographs though could be a portion of scarring that is seen in the upper chest. Mild interstitial prominence component of which is chronic and likely accentuated by lower lung  volumes than on the prior study. Post cement augmentation of the mid to lower thoracic spine. On limited assessment no acute skeletal process. IMPRESSION: 1. Increased interstitial prominence compared to previous imaging is of uncertain significance. A component of this could be chronic but it is difficult to exclude mild bronchopneumonia or pneumonitis. Correlate with any respiratory symptoms. 2. Nodule in the RIGHT upper lobe measuring approximately 7 mm may be new from previous chest radiographs though could be a portion of scarring that is seen in the upper chest. Consider short interval PA and lateral chest radiograph when the patient is able. If persistent CT may be warranted. 3. Large hiatal hernia. 4. Biapical pleural and parenchymal scarring. Electronically Signed   By: GZetta BillsM.D.   On: 02/09/2023 18:26   DG Hip Unilat W or Wo Pelvis 2-3 Views Right  Result Date: 02/09/2023 CLINICAL DATA:  Recent trip and fall 2 days ago with right hip pain, initial encounter EXAM: DG HIP (WITH OR WITHOUT PELVIS) 3V RIGHT COMPARISON:  None Available. FINDINGS: Pelvic ring is intact. Subcapital right femoral neck fracture is noted with impaction and angulation at the fracture site. No acute soft tissue abnormality is noted. IMPRESSION: Subcapital right femoral neck fracture. Electronically Signed   By: MInez CatalinaM.D.   On: 02/09/2023 17:26    Family History Reviewed and non-contributory, no pertinent history of problems with bleeding or anesthesia    Review of Systems No fevers or chills No numbness or tingling No chest pain No shortness of breath No bowel or bladder dysfunction No GI distress No headaches    OBJECTIVE  Vitals:Patient Vitals for the past 8 hrs:  BP Temp Temp src Pulse Resp SpO2 Height Weight  02/10/23 1432 (!) 150/94 99 F (37.2 C) Oral 75 -- 96 % -- --  02/10/23 1431 -- -- -- -- -- -- 5' 5"$  (1.651 m) 66.2 kg  02/10/23 1338 -- 97.9 F (36.6 C) Oral -- -- -- -- --   02/10/23 1200 (!) 147/85 -- -- 98 16 98 % -- --  02/10/23 1125 -- 97.8 F (36.6 C) Oral -- -- -- -- --  General: Alert, no acute distress Cardiovascular: Extremities are warm Respiratory: No cyanosis, no use of accessory musculature Skin: No lesions in the area of chief complaint  Neurologic: Sensation intact distally  Psychiatric: Patient is competent for consent with normal mood and affect Lymphatic: No swelling obvious and reported other than the area involved in the exam below Extremities  RLE: Extremity held in a fixed position.  ROM deferred due to known fracture.  Sensation is intact distally in the sural, saphenous, DP, SP, and plantar nerve distribution. 2+ DP pulse.  Toes are WWP.  Active motion intact in the TA/EHL/GS. LLE: Sensation is intact distally in the sural, saphenous, DP, SP, and plantar nerve distribution. 2+ DP pulse.  Toes are WWP.  Active motion intact in the TA/EHL/GS. Tolerates gentle ROM of the hip.  No pain with axial loading.     Test Results Imaging XR of the Right hip demonstrates a Displaced femoral neck fracture.  Labs cbc Recent Labs    02/09/23 1650 02/10/23 0415  WBC 8.1 6.5  HGB 15.1* 14.1  HCT 42.5 40.8  PLT 187 158    Labs coag Recent Labs    02/09/23 1650  INR 2.4*    Recent Labs    02/09/23 1650 02/10/23 0415  NA 132* 136  K 2.6* 4.3  CL 95* 102  CO2 24 25  GLUCOSE 112* 84  BUN 14 10  CREATININE 0.79 0.75  CALCIUM 8.8* 8.4*

## 2023-02-10 NOTE — Progress Notes (Signed)
  Transition of Care [facility]) Screening Note   Patient Details  Name: Jody Wilson Date of Birth: [birth year]   Transition of Care [facility]) CM/SW Contact:    Iona Beard, [place] Phone Number: 02/10/2023, 11:43 AM    Transition of Care Department [facility]) has reviewed patient and no TOC needs have been identified at this time. We will continue to monitor patient advancement through interdisciplinary progression rounds. If new patient transition needs arise, please place a TOC consult.

## 2023-02-10 NOTE — Progress Notes (Signed)
PROGRESS NOTE  Jody Wilson [id number] DOB: February 03, 1951   PCP: Dettinger, Fransisca Kaufmann, MD  Patient is from: Home  DOA: 02/09/2023 LOS: 1  Chief complaints Chief Complaint  Patient presents with   Hip Pain     Brief Narrative / Interim history: 72 year old F with PMH of HTN and tobacco use disorder presenting with right hip pain after accidental fall at home.  Patient reports tripping over her flip-flop and fall on the right side 2 days prior.  Denies striking her head or LOC. In ED, elevated BP to 196/113.  K2.6.  Mg 1.6.  Pelvic x-ray with subcapital right femoral neck fracture.  Orthopedic surgery consulted and admitted.  Plan for surgical repair on 2/23  Subjective: Seen and examined earlier this morning.  No major events overnight of this morning.  Reports significant pain in right hip.  Denies numbness or tingling.  No other complaints.  Denies drinking alcohol or recreational drug use.  Objective: Vitals:   02/10/23 1200 02/10/23 1338 02/10/23 1431 02/10/23 1432  BP: (!) 147/85   (!) 150/94  Pulse: 98   75  Resp: 16     Temp:  97.9 F (36.6 C)  99 F (37.2 C)  TempSrc:  Oral  Oral  SpO2: 98%   96%  Weight:   66.2 kg   Height:   5' 5"$  (1.651 m)     Examination:  GENERAL: No apparent distress.  Nontoxic. HEENT: MMM.  Vision and hearing grossly intact.  NECK: Supple.  No apparent JVD.  RESP:  No IWOB.  Fair aeration bilaterally. CVS:  RRR. Heart sounds normal.  ABD/GI/GU: BS+. Abd soft, NTND.  MSK/EXT:  Moves left extremity.  Does not move right extremity due to pain. SKIN: no apparent skin lesion or wound NEURO: Awake, alert and oriented appropriately.  No apparent focal neuro deficit. PSYCH: Calm. Normal affect.   Procedures:  None  Microbiology summarized: None  Assessment and plan: Principal Problem:   Closed right hip fracture [facility]) Active Problems:   Electrolyte abnormality   Hypertension   Tobacco abuse   Anxiety and depression   Pulmonary  nodule  Accidental fall at home Closed right hip fracture due to fall -Orthopedic surgery following-plan for surgical repair on 2/23 -Pain control-changed morphine to Dilaudid.  Added oxycodone.   Hypokalemia/hypomagnesemia: Likely due to HCTZ and Benicar.  Denies alcohol use.  Resolved. -Hold HCTZ -Change IVF to LR.  Essential hypertension: BP slightly elevated but improved.  Likely due to pain. -Pain control as above. -Continue Avapro. -IV hydralazine as needed   Pulmonary nodule: 7 mm RUL nodule on CXR -Short interval two-view CXR or CT chest recommended.   Tobacco abuse: Reportedly smokes a pack a day. -Encouraged smoking cessation. -Nicotine patch   Anxiety and depression: Stable. -Continue home meds.   Body mass index is 24.29 kg/m.           DVT prophylaxis:  heparin injection 5,000 Units Start: 02/09/23 2230  Code Status: Full code Family Communication: Updated patient's son at bedside. Level of care: Telemetry Status is: Inpatient Remains inpatient appropriate because: Right hip fracture   Final disposition: TBD Consultants:  Orthopedic surgery  35 minutes with more than 50% spent in reviewing records, counseling patient/family and coordinating care.   Sch Meds:  Scheduled Meds:  citalopram  40 mg Oral Daily   heparin  5,000 Units Subcutaneous Q8H   irbesartan  150 mg Oral BID   pantoprazole  40 mg Oral Daily  pravastatin  40 mg Oral QPM   Continuous Infusions:  0.9 % NaCl with KCl 40 mEq / L 100 mL/hr at 02/10/23 0913   PRN Meds:.acetaminophen **OR** acetaminophen, ALPRAZolam, hydrALAZINE, morphine injection, ondansetron **OR** ondansetron (ZOFRAN) IV, polyethylene glycol  Antimicrobials: Anti-infectives (From admission, onward)    None        I have personally reviewed the following labs and images: CBC: Recent Labs  Lab 02/09/23 1650 02/10/23 0415  WBC 8.1 6.5  NEUTROABS 6.1  --   HGB 15.1* 14.1  HCT 42.5 40.8  MCV 99.8  104.3*  PLT 187 158   BMP &GFR Recent Labs  Lab 02/09/23 1650 02/10/23 0415  NA 132* 136  K 2.6* 4.3  CL 95* 102  CO2 24 25  GLUCOSE 112* 84  BUN 14 10  CREATININE 0.79 0.75  CALCIUM 8.8* 8.4*  MG 1.6* 2.0   Estimated Creatinine Clearance: 58 mL/min (by C-G formula based on SCr of 0.75 mg/dL). Liver & Pancreas: No results for input(s): "AST", "ALT", "ALKPHOS", "BILITOT", "PROT", "ALBUMIN" in the last 168 hours. No results for input(s): "LIPASE", "AMYLASE" in the last 168 hours. No results for input(s): "AMMONIA" in the last 168 hours. Diabetic: No results for input(s): "HGBA1C" in the last 72 hours. No results for input(s): "GLUCAP" in the last 168 hours. Cardiac Enzymes: No results for input(s): "CKTOTAL", "CKMB", "CKMBINDEX", "TROPONINI" in the last 168 hours. No results for input(s): "PROBNP" in the last 8760 hours. Coagulation Profile: Recent Labs  Lab 02/09/23 1650  INR 2.4*   Thyroid Function Tests: No results for input(s): "TSH", "T4TOTAL", "FREET4", "T3FREE", "THYROIDAB" in the last 72 hours. Lipid Profile: No results for input(s): "CHOL", "HDL", "LDLCALC", "TRIG", "CHOLHDL", "LDLDIRECT" in the last 72 hours. Anemia Panel: No results for input(s): "VITAMINB12", "FOLATE", "FERRITIN", "TIBC", "IRON", "RETICCTPCT" in the last 72 hours. Urine analysis:    Component Value Date/Time   COLORURINE YELLOW 07/11/2020 2212   APPEARANCEUR Clear 01/22/2022 1411   LABSPEC 1.008 07/11/2020 2212   PHURINE 6.0 07/11/2020 2212   GLUCOSEU Negative 01/22/2022 1411   HGBUR NEGATIVE 07/11/2020 2212   BILIRUBINUR Negative 01/22/2022 1411   KETONESUR NEGATIVE 07/11/2020 2212   PROTEINUR Negative 01/22/2022 1411   PROTEINUR NEGATIVE 07/11/2020 2212   UROBILINOGEN negative 04/11/2014 1713   NITRITE Negative 01/22/2022 1411   NITRITE NEGATIVE 07/11/2020 2212   LEUKOCYTESUR Negative 01/22/2022 1411   LEUKOCYTESUR NEGATIVE 07/11/2020 2212   Sepsis Labs: Invalid input(s):  "PROCALCITONIN", "LACTICIDVEN"  Microbiology: No results found for this or any previous visit (from the past 240 hour(s)).  Radiology Studies: DG Chest Portable 1 View  Result Date: 02/09/2023 CLINICAL DATA:  72 year old female presenting for evaluation of fall with RIGHT hip fracture, preoperative evaluation. EXAM: PORTABLE CHEST 1 VIEW COMPARISON:  Imaging from November 02, 2022 and July of 2023. FINDINGS: Cardiomediastinal contours and hilar structures are stable with signs of large hiatal hernia. Heart size accentuated by AP projection. Biapical pleural and parenchymal scarring. No signs of consolidation. No pleural effusion. Nodule in the RIGHT upper lobe measuring approximately 7 mm may be new from previous chest radiographs though could be a portion of scarring that is seen in the upper chest. Mild interstitial prominence component of which is chronic and likely accentuated by lower lung volumes than on the prior study. Post cement augmentation of the mid to lower thoracic spine. On limited assessment no acute skeletal process. IMPRESSION: 1. Increased interstitial prominence compared to previous imaging is of uncertain significance. A component of  this could be chronic but it is difficult to exclude mild bronchopneumonia or pneumonitis. Correlate with any respiratory symptoms. 2. Nodule in the RIGHT upper lobe measuring approximately 7 mm may be new from previous chest radiographs though could be a portion of scarring that is seen in the upper chest. Consider short interval PA and lateral chest radiograph when the patient is able. If persistent CT may be warranted. 3. Large hiatal hernia. 4. Biapical pleural and parenchymal scarring. Electronically Signed   By: Zetta Bills M.D.   On: 02/09/2023 18:26   DG Hip Unilat W or Wo Pelvis 2-3 Views Right  Result Date: 02/09/2023 CLINICAL DATA:  Recent trip and fall 2 days ago with right hip pain, initial encounter EXAM: DG HIP (WITH OR WITHOUT PELVIS)  3V RIGHT COMPARISON:  None Available. FINDINGS: Pelvic ring is intact. Subcapital right femoral neck fracture is noted with impaction and angulation at the fracture site. No acute soft tissue abnormality is noted. IMPRESSION: Subcapital right femoral neck fracture. Electronically Signed   By: Inez Catalina M.D.   On: 02/09/2023 17:26      Ajai Terhaar T. [place]  If 7PM-7AM, please contact night-coverage [email or web address] 02/10/2023, 2:50 PM

## 2023-02-10 NOTE — Plan of Care (Signed)
  Problem: Education: Goal: Verbalization of understanding the information provided (i.e., activity precautions, restrictions, etc) will improve Outcome: Progressing Goal: Individualized Educational Video(s) Outcome: Progressing   

## 2023-02-11 ENCOUNTER — Encounter (HOSPITAL_COMMUNITY)
Admission: EM | Disposition: A | Discharge status: Skilled Nursing Facility | Payer: Self-pay | Source: Home / Self Care | Attending: Internal Medicine

## 2023-02-11 ENCOUNTER — Encounter (HOSPITAL_COMMUNITY): Payer: Self-pay | Admitting: Internal Medicine

## 2023-02-11 ENCOUNTER — Other Ambulatory Visit: Payer: Self-pay

## 2023-02-11 ENCOUNTER — Inpatient Hospital Stay (HOSPITAL_COMMUNITY): Payer: Medicare Other

## 2023-02-11 ENCOUNTER — Inpatient Hospital Stay (HOSPITAL_COMMUNITY): Payer: Medicare Other | Admitting: Anesthesiology

## 2023-02-11 DIAGNOSIS — Z87891 Personal history of nicotine dependence: Secondary | ICD-10-CM

## 2023-02-11 DIAGNOSIS — S72001A Fracture of unspecified part of neck of right femur, initial encounter for closed fracture: Secondary | ICD-10-CM | POA: Diagnosis not present

## 2023-02-11 DIAGNOSIS — I10 Essential (primary) hypertension: Secondary | ICD-10-CM

## 2023-02-11 DIAGNOSIS — F418 Other specified anxiety disorders: Secondary | ICD-10-CM

## 2023-02-11 DIAGNOSIS — E875 Hyperkalemia: Secondary | ICD-10-CM

## 2023-02-11 DIAGNOSIS — E871 Hypo-osmolality and hyponatremia: Secondary | ICD-10-CM

## 2023-02-11 HISTORY — PX: HIP ARTHROPLASTY: SHX981

## 2023-02-11 LAB — RENAL FUNCTION PANEL
Albumin: 2.8 g/dL — ABNORMAL LOW (ref 3.5–5.0)
Anion gap: 6 (ref 5–15)
BUN: 8 mg/dL (ref 8–23)
CO2: 27 mmol/L (ref 22–32)
Calcium: 8.5 mg/dL — ABNORMAL LOW (ref 8.9–10.3)
Chloride: 99 mmol/L (ref 98–111)
Creatinine, Ser: 0.58 mg/dL (ref 0.44–1.00)
GFR, Estimated: >60 mL/min (ref >60)
Glucose, Bld: 107 mg/dL — ABNORMAL HIGH (ref 70–99)
Phosphorus: 3 mg/dL (ref 2.5–4.6)
Potassium: 5.3 mmol/L — ABNORMAL HIGH (ref 3.5–5.1)
Sodium: 132 mmol/L — ABNORMAL LOW (ref 135–145)

## 2023-02-11 LAB — CBC
HCT: 40.1 % (ref 36.0–46.0)
Hemoglobin: 13.6 g/dL (ref 12.0–15.0)
MCH: 35.7 pg — ABNORMAL HIGH (ref 26.0–34.0)
MCHC: 33.9 g/dL (ref 30.0–36.0)
MCV: 105.2 fL — ABNORMAL HIGH (ref 80.0–100.0)
Platelets: 178 10*3/uL (ref 150–400)
RBC: 3.81 MIL/uL — ABNORMAL LOW (ref 3.87–5.11)
RDW: 12.2 % (ref 11.5–15.5)
WBC: 6.7 10*3/uL (ref 4.0–10.5)
nRBC: 0 % (ref 0.0–0.2)

## 2023-02-11 LAB — SURGICAL PCR SCREEN
MRSA, PCR: NEGATIVE
Staphylococcus aureus: NEGATIVE

## 2023-02-11 LAB — MAGNESIUM: Magnesium: 1.7 mg/dL (ref 1.7–2.4)

## 2023-02-11 SURGERY — HEMIARTHROPLASTY, HIP, DIRECT ANTERIOR APPROACH, FOR FRACTURE
Anesthesia: General | Site: Hip | Laterality: Right

## 2023-02-11 MED ORDER — ONDANSETRON HCL 4 MG/2ML IJ SOLN
INTRAMUSCULAR | Status: AC
  Filled 2023-02-11: qty 2

## 2023-02-11 MED ORDER — ORAL CARE MOUTH RINSE: 15.0000 mL | Freq: Once | OROMUCOSAL | Status: AC

## 2023-02-11 MED ORDER — HYDROMORPHONE HCL 1 MG/ML IJ SOLN
INTRAMUSCULAR | Status: AC
  Filled 2023-02-11: qty 0.5

## 2023-02-11 MED ORDER — ROCURONIUM 10MG/ML (10ML) SYRINGE FOR MEDFUSION PUMP - OPTIME
INTRAVENOUS | Status: DC | PRN
  Administered 2023-02-11: 50 mg via INTRAVENOUS

## 2023-02-11 MED ORDER — SODIUM CHLORIDE 0.9 % IR SOLN
Status: DC | PRN
  Administered 2023-02-11: 3000 mL

## 2023-02-11 MED ORDER — FENTANYL CITRATE (PF) 100 MCG/2ML IJ SOLN
INTRAMUSCULAR | Status: DC | PRN
  Administered 2023-02-11: 25 ug via INTRAVENOUS
  Administered 2023-02-11: 50 ug via INTRAVENOUS
  Administered 2023-02-11: 75 ug via INTRAVENOUS

## 2023-02-11 MED ORDER — ROCURONIUM BROMIDE 10 MG/ML (PF) SYRINGE
PREFILLED_SYRINGE | INTRAVENOUS | Status: AC
  Filled 2023-02-11: qty 10

## 2023-02-11 MED ORDER — PROPOFOL 10 MG/ML IV BOLUS
INTRAVENOUS | Status: DC | PRN
  Administered 2023-02-11: 150 mg via INTRAVENOUS

## 2023-02-11 MED ORDER — FENTANYL CITRATE (PF) 100 MCG/2ML IJ SOLN
INTRAMUSCULAR | Status: AC
  Filled 2023-02-11: qty 2

## 2023-02-11 MED ORDER — DEXAMETHASONE SODIUM PHOSPHATE 10 MG/ML IJ SOLN
INTRAMUSCULAR | Status: AC
  Filled 2023-02-11: qty 1

## 2023-02-11 MED ORDER — VANCOMYCIN HCL 1000 MG IV SOLR
INTRAVENOUS | Status: AC
  Filled 2023-02-11: qty 20

## 2023-02-11 MED ORDER — PHENYLEPHRINE 80 MCG/ML (10ML) SYRINGE FOR IV PUSH (FOR BLOOD PRESSURE SUPPORT)
PREFILLED_SYRINGE | INTRAVENOUS | Status: AC
  Filled 2023-02-11: qty 10

## 2023-02-11 MED ORDER — HYDROMORPHONE HCL 1 MG/ML IJ SOLN
0.5000 mg | Freq: Once | INTRAMUSCULAR | Status: AC
  Administered 2023-02-11: 0.5 mg via INTRAVENOUS

## 2023-02-11 MED ORDER — SUGAMMADEX SODIUM 200 MG/2ML IV SOLN
INTRAVENOUS | Status: DC | PRN
  Administered 2023-02-11: 100 mg via INTRAVENOUS

## 2023-02-11 MED ORDER — PROPOFOL 10 MG/ML IV BOLUS
INTRAVENOUS | Status: AC
  Filled 2023-02-11: qty 20

## 2023-02-11 MED ORDER — VANCOMYCIN HCL 1 G IV SOLR
INTRAVENOUS | Status: DC | PRN
  Administered 2023-02-11: 1000 mg

## 2023-02-11 MED ORDER — CEFAZOLIN SODIUM-DEXTROSE 2-4 GM/100ML-% IV SOLN
2.0000 g | Freq: Three times a day (TID) | INTRAVENOUS | Status: AC
  Administered 2023-02-12 (×2): 2 g via INTRAVENOUS
  Filled 2023-02-11 (×3): qty 100

## 2023-02-11 MED ORDER — LIDOCAINE HCL (CARDIAC) PF 50 MG/5ML IV SOSY
PREFILLED_SYRINGE | INTRAVENOUS | Status: DC | PRN
  Administered 2023-02-11: 50 mg via INTRAVENOUS

## 2023-02-11 MED ORDER — 0.9 % SODIUM CHLORIDE (POUR BTL) OPTIME
TOPICAL | Status: DC | PRN
  Administered 2023-02-11: 1000 mL

## 2023-02-11 MED ORDER — EPHEDRINE 5 MG/ML INJ
INTRAVENOUS | Status: AC
  Filled 2023-02-11: qty 5

## 2023-02-11 MED ORDER — SODIUM CHLORIDE 0.9 % IV SOLN: INTRAVENOUS | Status: DC

## 2023-02-11 MED ORDER — LACTATED RINGERS IV SOLN: INTRAVENOUS | Status: DC

## 2023-02-11 MED ORDER — PHENYLEPHRINE HCL-NACL 20-0.9 MG/250ML-% IV SOLN
INTRAVENOUS | Status: AC
  Filled 2023-02-11: qty 250

## 2023-02-11 MED ORDER — LIDOCAINE HCL (PF) 2 % IJ SOLN
INTRAMUSCULAR | Status: AC
  Filled 2023-02-11: qty 5

## 2023-02-11 MED ORDER — CHLORHEXIDINE GLUCONATE 0.12 % MT SOLN
15.0000 mL | Freq: Once | OROMUCOSAL | Status: AC
  Administered 2023-02-11: 15 mL via OROMUCOSAL
  Filled 2023-02-11: qty 15

## 2023-02-11 MED ORDER — TRANEXAMIC ACID-NACL 1000-0.7 MG/100ML-% IV SOLN
INTRAVENOUS | Status: AC
  Filled 2023-02-11: qty 100

## 2023-02-11 MED ORDER — BUPIVACAINE-EPINEPHRINE (PF) 0.5% -1:200000 IJ SOLN
INTRAMUSCULAR | Status: AC
  Filled 2023-02-11: qty 30

## 2023-02-11 MED ORDER — HYDROMORPHONE HCL 1 MG/ML IJ SOLN: 0.2500 mg | INTRAMUSCULAR | Status: DC | PRN

## 2023-02-11 MED ORDER — EPHEDRINE SULFATE (PRESSORS) 50 MG/ML IJ SOLN
INTRAMUSCULAR | Status: DC | PRN
  Administered 2023-02-11: 15 mg via INTRAVENOUS

## 2023-02-11 MED ORDER — CEFAZOLIN SODIUM-DEXTROSE 2-4 GM/100ML-% IV SOLN
INTRAVENOUS | Status: AC
  Administered 2023-02-11: 2 g via INTRAVENOUS
  Filled 2023-02-11: qty 100

## 2023-02-11 MED ORDER — BUPIVACAINE-EPINEPHRINE (PF) 0.5% -1:200000 IJ SOLN
INTRAMUSCULAR | Status: DC | PRN
  Administered 2023-02-11: 30 mL via PERINEURAL

## 2023-02-11 MED ORDER — DEXAMETHASONE SODIUM PHOSPHATE 10 MG/ML IJ SOLN
INTRAMUSCULAR | Status: DC | PRN
  Administered 2023-02-11: 8 mg via INTRAVENOUS

## 2023-02-11 SURGICAL SUPPLY — 57 items
ANCHOR SUT MAYO CAT SZ1 1/2 CI (Anchor) IMPLANT
BALL HIP ARTICU 28 +5 (Hips) IMPLANT
BIPOLAR DEPUY 48 (Hips) ×1 IMPLANT
BIT DRILL 2.8X128 (BIT) ×1 IMPLANT
BLADE SAGITTAL 25.0X1.27X90 (BLADE) ×1 IMPLANT
CATH FOLEY 2WAY SLVR  5CC 14FR (CATHETERS) ×1
CATH FOLEY 2WAY SLVR 5CC 14FR (CATHETERS) IMPLANT
CEMENT BONE 10-PACK (Cement) IMPLANT
CHLORAPREP W/TINT 26 (MISCELLANEOUS) ×1 IMPLANT
CLOTH BEACON ORANGE TIMEOUT ST (SAFETY) ×1 IMPLANT
COVER LIGHT HANDLE STERIS (MISCELLANEOUS) ×2 IMPLANT
DRAPE HIP W/POCKET STRL (MISCELLANEOUS) ×1 IMPLANT
DRAPE U-SHAPE 47X51 STRL (DRAPES) ×1 IMPLANT
DRESSING AQUACEL AG ADV 3.5X12 (MISCELLANEOUS) ×1 IMPLANT
DRSG AQUACEL AG ADV 3.5X12 (MISCELLANEOUS) ×1
DRSG MEPILEX SACRM 8.7X9.8 (GAUZE/BANDAGES/DRESSINGS) ×1 IMPLANT
ELECT REM PT RETURN 9FT ADLT (ELECTROSURGICAL) ×1
ELECTRODE REM PT RTRN 9FT ADLT (ELECTROSURGICAL) ×1 IMPLANT
GLOVE BIO SURGEON STRL SZ8 (GLOVE) ×4 IMPLANT
GLOVE BIOGEL PI IND STRL 7.0 (GLOVE) ×2 IMPLANT
GLOVE SRG 8 PF TXTR STRL LF DI (GLOVE) ×1 IMPLANT
GLOVE SURG UNDER POLY LF SZ8 (GLOVE) ×1
GOWN STRL REUS W/ TWL XL LVL3 (GOWN DISPOSABLE) ×1 IMPLANT
GOWN STRL REUS W/TWL LRG LVL3 (GOWN DISPOSABLE) ×2 IMPLANT
GOWN STRL REUS W/TWL XL LVL3 (GOWN DISPOSABLE) ×1
HANDPIECE INTERPULSE COAX TIP (DISPOSABLE) ×1
HEAD BIPOLAR DEPUY 48 (Hips) IMPLANT
HIP BALL ARTICU 28 +5 (Hips) ×1 IMPLANT
INST SET MAJOR BONE (KITS) ×1 IMPLANT
IV NS IRRIG 3000ML ARTHROMATIC (IV SOLUTION) ×1 IMPLANT
KIT BLADEGUARD II DBL (SET/KITS/TRAYS/PACK) ×1 IMPLANT
KIT TURNOVER KIT A (KITS) ×1 IMPLANT
MANIFOLD NEPTUNE II (INSTRUMENTS) ×1 IMPLANT
MARKER SKIN DUAL TIP RULER LAB (MISCELLANEOUS) ×1 IMPLANT
NDL HYPO 21X1.5 SAFETY (NEEDLE) ×1 IMPLANT
NDL MAYO 1/2 CRC TROCAR PT (NEEDLE) IMPLANT
NEEDLE HYPO 21X1.5 SAFETY (NEEDLE) ×1 IMPLANT
NEEDLE MAYO 1/2 CRC TROCAR PT (NEEDLE) ×1 IMPLANT
NS IRRIG 1000ML POUR BTL (IV SOLUTION) ×1 IMPLANT
PACK SURGICAL SETUP 50X90 (CUSTOM PROCEDURE TRAY) ×1 IMPLANT
PACK TOTAL JOINT (CUSTOM PROCEDURE TRAY) ×1 IMPLANT
PAD ARMBOARD 7.5X6 YLW CONV (MISCELLANEOUS) ×1 IMPLANT
PASSER SUT SWANSON 36MM LOOP (INSTRUMENTS) IMPLANT
PILLOW ABDUCTION MEDIUM (MISCELLANEOUS) IMPLANT
SET BASIN LINEN APH (SET/KITS/TRAYS/PACK) ×1 IMPLANT
SET HNDPC FAN SPRY TIP SCT (DISPOSABLE) ×1 IMPLANT
STEM SUMMIT BASIC PRESSFIT SZ5 (Hips) IMPLANT
STRIP CLOSURE SKIN 1/2X4 (GAUZE/BANDAGES/DRESSINGS) ×2 IMPLANT
SUT MNCRL AB 4-0 PS2 18 (SUTURE) ×2 IMPLANT
SUT MON AB 2-0 CT1 36 (SUTURE) ×1 IMPLANT
SUT VIC AB 1 CT1 27 (SUTURE) ×5
SUT VIC AB 1 CT1 27XBRD ANTBC (SUTURE) ×4 IMPLANT
SYR 30ML LL (SYRINGE) ×2 IMPLANT
SYR BULB IRRIG 60ML STRL (SYRINGE) ×2 IMPLANT
TRAY FOLEY MTR SLVR 16FR STAT (SET/KITS/TRAYS/PACK) ×1 IMPLANT
WATER STERILE IRR 1000ML POUR (IV SOLUTION) ×2 IMPLANT
YANKAUER SUCT 12FT TUBE ARGYLE (SUCTIONS) ×1 IMPLANT

## 2023-02-11 NOTE — Progress Notes (Signed)
   ORTHOPAEDIC PROGRESS NOTE  Scheduled for Right Hip hemiarthroplasty  DOS: 02/11/2023  SUBJECTIVE: No issues over night.  More comfortable than the night before.  Continues to have pain, improves with PO pain medicines.   OBJECTIVE: PE:  Alert and oriented.  No acute distress  Right hip without deformity.  No bruising Held in a fixed position.  Toes are warm and well perfused Intact sensation to foot Active TA/EHL  Vitals:   02/10/23 2207 02/11/23 0456  BP: (!) 164/98 (!) 168/96  Pulse: 80 79  Resp:  18  Temp:  98 F (36.7 C)  SpO2:  93%     ASSESSMENT: Jody Wilson is a 72 y.o. female doing well.  Ready for OR.  NPO since midnight.   PLAN: Weightbearing: NWB RLE Insicional and dressing care: Reinforce dressings as needed; none currently Orthopedic device(s): None VTE prophylaxis: Recommend Aspirin 72m BID; to begin POD#1 Pain control: PO pain medications as needed; judicious use of narcotics Follow - up plan: 2 weeks   Contact information:     Shaaron Golliday A. CAmedeo Kinsman MD [email or web address]  [place]  [id number]: [phone]: [phone]

## 2023-02-11 NOTE — Transfer of Care (Signed)
Immediate Anesthesia Transfer of Care Note  Patient: Jody Wilson  Procedure(s) Performed: ARTHROPLASTY BIPOLAR HIP (HEMIARTHROPLASTY) (Right: Hip)  Patient Location: PACU  Anesthesia Type:General  Level of Consciousness: awake  Airway & Oxygen Therapy: Patient Spontanous Breathing  Post-op Assessment: Report given to RN  Post vital signs: Reviewed  Last Vitals:  Vitals Value Taken Time  BP    Temp    Pulse 105 02/11/23 1530  Resp 20 02/11/23 1530  SpO2 91 % 02/11/23 1530  Vitals shown include unvalidated device data.  Last Pain:  Vitals:   02/11/23 1155  TempSrc: Oral  PainSc: 10-Worst pain ever      Patients Stated Pain Goal: 5 ([id number])  Complications: No notable events documented.

## 2023-02-11 NOTE — Care Management Important Message (Signed)
Important Message  Patient Details  Name: Jody Wilson MRN: [id number] Date of Birth: 1951-03-01   Medicare Important Message Given:  Yes     Tommy Medal 02/11/2023, 9:54 AM

## 2023-02-11 NOTE — Op Note (Signed)
Orthopaedic Surgery Operative Note (CSN: [id number])  Jody Wilson  [birth year] Date of Surgery: 02/11/2023   Diagnoses:  Displaced right femoral neck fracture  Procedure: Right hip hemiarthroplasty   Operative Finding Successful completion of the planned procedure.  Bipolar pressfit without complication.  Stable to 90 of flexion and 45 internal rotation    Post-Op Diagnosis: Same Surgeons:Primary: Mordecai Rasmussen, MD Assistants: Vevelyn Royals Location: AP OR ROOM 4 Anesthesia: General with local anesthesia Antibiotics: Ancef 2 g with local vancomycin powder 1 g at the surgical site Tourniquet time: N/A Estimated Blood Loss: [id number] cc Complications: None Specimens: None Implants: Implant Name Type Inv. Item Serial No. Manufacturer Lot No. LRB No. Used Action  HIP BALL ARTICU 28 +5 - [id number] Hips HIP BALL ARTICU 28 +5 [id number] [employer]  Right 1 Implanted  BIPOLAR DEPUY 48 - [id number] Hips BIPOLAR DEPUY 48  [employer] [id number] Right 1 Implanted  STEM SUMMIT BASIC PRESSFIT SZ5 - [id number] Hips STEM [employer] PRESSFIT SZ5  [employer] [id number] Right 1 Implanted    Indications for Surgery:   Jody Wilson is a 72 y.o. female who fell and sustained a displaced right femoral neck fracture.  In order to restore form and function I recommended surgery, to include right hip hemiarthroplasty.  Benefits and risks of operative and nonoperative management were discussed prior to surgery with the patient and informed consent form was completed.  Specific risks including infection, need for additional surgery, bleeding, dislocation, persistent pain, damage to surrounding structures and more severe complications associated with anesthesia were discussed.  All questions answered.  She elected to proceed with surgery.  Surgical consent was finalized.    Procedure:   The patient was identified properly. Informed consent was obtained and the surgical site was marked.  The patient was taken up to suite where general anesthesia was induced.  The patient was positioned lateral, with the right hip up.  The right leg was prepped and draped in the usual sterile fashion.  Timeout was performed before the beginning of the case.    Ancef dosing was confirmed prior to incision.  Patient received 1 g of TXA before the start of the case  We started by making an incision centered over the greater trochanter with a scalpel. We used the scalpel to continue to dissect to the fascia.  Electrocautery was used to help with hemostasis.  A cobb elevator was used to clear the IT band and then it was pierced with Bovie electrocautery. Mayo scissors were used to cut the fascia in a longitudinal fashion. The gluteus maximus fibers were bluntly split in line with their fibers.   The femur was slowly internally rotated, putting tension on the posterior structures. The short external rotators were identified and tagged with #1 vicryl.  Bovie electrocautery was used to dissect the short external rotators off of the insertion onto the femur.  Next, we identified the capsule and made a T capsulotomy.  The capsule was then tagged with #1 Vicryl sutures.   Next, we visualized the femoral neck fracture. We identified the sciatic nerve by palpation and verified that it was not in danger during the dissection.    We then made a neck cut approximately 1 cm above the lesser trochanter with a saw. We removed the femoral head. We used the box cutter to cut away some of the greater trochanter for ease of insertion of the stem. We then used the canal finder to locate the femoral canal.  Using the angle of the femoral neck as our guide for version, we broached sequentially. We trialed components and found appropriate fit and stability.  The patient would come to full extension of the hip. The hip was stable at 90 degrees of flexion, and 45 degrees of internal rotation.  Leg lengths were approximately equal.  We  then removed the trials as well as the broach. We ensured there were no foreign bodies or bone within the acetabulum. We copiously irrigated the wound. An appropriately sized head was placed on to the stem and the hip was reduced. Again, we noted it was stable in the previously mentioned manipulations.   We repaired the posterior capsule.  Short external rotators repaired to the greater trochanter to bone tunnels. We closed the fascia of the iliotibial band and gluteus maximus with running and intterupted Vicryl sutures. We then closed Scarpa's fascia with Vicryl sutures. Skin was closed with 2-0 monocryl and 4-0 Monocryl with Steri-Strips and an Aquacel dressing was placed.  A hip abduction pillow was secured.  Patient was awoken taken to PACU in stable condition.   Post-operative plan:  The patient will be WBAT on the operative extremity. Hip abduction pillow in place at all times when in bed Posterior hip precautions PT/OT    DVT prophylaxis per primary team, no orthopedic contraindications.    Pain control with PRN pain medication preferring oral medicines.   Follow up plan will be scheduled in approximately 14 days for incision check and XR.

## 2023-02-11 NOTE — Anesthesia Postprocedure Evaluation (Signed)
Anesthesia Post Note  Patient: Jody Wilson  Procedure(s) Performed: ARTHROPLASTY BIPOLAR HIP (HEMIARTHROPLASTY) (Right: Hip)  Patient location during evaluation: Phase II Anesthesia Type: General Level of consciousness: awake and alert and oriented Pain management: pain level controlled Vital Signs Assessment: post-procedure vital signs reviewed and stable Respiratory status: spontaneous breathing, nonlabored ventilation, respiratory function stable and patient connected to nasal cannula oxygen Cardiovascular status: blood pressure returned to baseline and stable Postop Assessment: no apparent nausea or vomiting Anesthetic complications: no  No notable events documented.   Last Vitals:  Vitals:   02/11/23 1545 02/11/23 1600  BP: 126/71 132/74  Pulse: (!) 103 94  Resp: (!) 24 17  Temp:    SpO2: 91% 92%    Last Pain:  Vitals:   02/11/23 1600  TempSrc:   PainSc: 1                  [name] [name]

## 2023-02-11 NOTE — Progress Notes (Signed)
Patient slept off and on through the night. Patient given 4 prn medications during this shift.  Patient has been NPO after midnight for surgical procedure this am. Plan of care ongoing.

## 2023-02-11 NOTE — Anesthesia Procedure Notes (Signed)
Procedure Name: Intubation Date/Time: 02/11/2023 12:49 PM  Performed by: Ollen Bowl, CRNAPre-anesthesia Checklist: Patient identified, Patient being monitored, Timeout performed, Emergency Drugs available and Suction available Patient Re-evaluated:Patient Re-evaluated prior to induction Oxygen Delivery Method: Circle system utilized Preoxygenation: Pre-oxygenation with 100% oxygen Induction Type: IV induction Ventilation: Mask ventilation without difficulty Laryngoscope Size: Mac and 3 Grade View: Grade I Tube type: Oral Tube size: 7.0 mm Number of attempts: 1 Airway Equipment and Method: Stylet Placement Confirmation: ETT inserted through vocal cords under direct vision, positive ETCO2 and breath sounds checked- equal and bilateral Secured at: 21 cm Tube secured with: Tape Dental Injury: Teeth and Oropharynx as per pre-operative assessment

## 2023-02-11 NOTE — Anesthesia Preprocedure Evaluation (Addendum)
Anesthesia Evaluation  Patient identified by MRN, date of birth, ID band Patient awake    Reviewed: Allergy & Precautions, H&P , NPO status , Patient's Chart, lab work & pertinent test results  Airway Mallampati: II  TM Distance: >3 FB Neck ROM: Full   Comment: NECK SX Dental  (+) Dental Advisory Given, Missing, Upper Dentures   Pulmonary Patient abstained from smoking., former smoker   Pulmonary exam normal breath sounds clear to auscultation       Cardiovascular Exercise Tolerance: Good hypertension, Pt. on medications Normal cardiovascular exam Rhythm:Regular Rate:Normal     Neuro/Psych  PSYCHIATRIC DISORDERS Anxiety Depression     Neuromuscular disease    GI/Hepatic Neg liver ROS, hiatal hernia,GERD  Medicated and Poorly Controlled,,  Endo/Other  negative endocrine ROS    Renal/GU negative Renal ROS  negative genitourinary   Musculoskeletal negative musculoskeletal ROS (+)    Abdominal   Peds negative pediatric ROS (+)  Hematology  (+) Blood dyscrasia, anemia   Anesthesia Other Findings H/O tracheostomy? Possible drain after neck sx not tracheostomy.  Reproductive/Obstetrics negative OB ROS                             Anesthesia Physical Anesthesia Plan  ASA: 3  Anesthesia Plan: General   Post-op Pain Management: Dilaudid IV   Induction: Intravenous, Rapid sequence and Cricoid pressure planned  PONV Risk Score and Plan: 4 or greater and Ondansetron and Dexamethasone  Airway Management Planned: Oral ETT  Additional Equipment:   Intra-op Plan:   Post-operative Plan: Extubation in OR  Informed Consent: I have reviewed the patients History and Physical, chart, labs and discussed the procedure including the risks, benefits and alternatives for the proposed anesthesia with the patient or authorized representative who has indicated his/her understanding and acceptance.      Dental advisory given  Plan Discussed with: CRNA and Surgeon  Anesthesia Plan Comments:         Anesthesia Quick Evaluation

## 2023-02-11 NOTE — Progress Notes (Signed)
PROGRESS NOTE  Jody Wilson [id number] DOB: 08/29/1951   PCP: Dettinger, Fransisca Kaufmann, MD  Patient is from: Home  DOA: 02/09/2023 LOS: 2  Chief complaints Chief Complaint  Patient presents with   Hip Pain     Brief Narrative / Interim history: 72 year old F with PMH of HTN and tobacco use disorder presenting with right hip pain after accidental fall at home.  Patient reports tripping over her flip-flop and fall on the right side 2 days prior.  Denies striking her head or LOC. In ED, elevated BP to 196/113.  K2.6.  Mg 1.6.  Pelvic x-ray with subcapital right femoral neck fracture.  Orthopedic surgery consulted and admitted.  Plan for surgical repair on 2/23  Subjective: Seen and examined earlier this morning.  No major events overnight of this morning.  Reports significant pain in her hip.  Objective: Vitals:   02/10/23 2151 02/10/23 2207 02/11/23 0456 02/11/23 1155  BP: (!) 173/111 (!) 164/98 (!) 168/96 (!) 163/94  Pulse: 82 80 79 74  Resp: '20  18 19  '$ Temp: 98.1 F (36.7 C)  98 F (36.7 C) 98.1 F (36.7 C)  TempSrc: Oral  Oral Oral  SpO2: 93%  93% 91%  Weight:    66.2 kg  Height:    '5\' 5"'$  (1.651 m)    Examination:   GENERAL: No apparent distress.  Nontoxic. HEENT: MMM.  Vision and hearing grossly intact.  NECK: Supple.  No apparent JVD.  RESP:  No IWOB.  Fair aeration bilaterally. CVS:  RRR. Heart sounds normal.  ABD/GI/GU: BS+. Abd soft, NTND.  MSK/EXT:   No apparent deformity.  Does not move right extremity due to pain. SKIN: no apparent skin lesion or wound NEURO: Awake and alert. Oriented appropriately.  No apparent focal neuro deficit. PSYCH: Calm. Normal affect.   Procedures:  None  Microbiology summarized: None  Assessment and plan: Principal Problem:   Closed right hip fracture (HCC) Active Problems:   Electrolyte abnormality   Hypertension   Tobacco abuse   Anxiety and depression   Pulmonary nodule  Accidental fall at home Closed right  hip fracture due to fall.  Vitamin D level normal. -Orthopedic surgery following-plan for surgical repair on 2/23 -Pain control-with as needed oxy and IV Dilaudid.   Hypokalemia/hypomagnesemia: Likely due to HCTZ and Benicar.  Denies alcohol use.  Resolved. -Continue holding HCTZ  Hyperkalemia/hyponatremia: Mild. -Change LR to NS.  Essential hypertension: BP slightly elevated but improved.  Likely due to pain. -Pain control as above. -Continue Avapro. -IV hydralazine as needed   Pulmonary nodule: 7 mm RUL nodule on CXR -Short interval two-view CXR or CT chest recommended.   Tobacco abuse: Reportedly smokes a pack a day. -Encouraged smoking cessation. -Nicotine patch   Anxiety and depression: Stable. -Continue home meds.   Body mass index is 24.3 kg/m.           DVT prophylaxis:  heparin injection 5,000 Units Start: 02/09/23 2230  Code Status: Full code Family Communication: None at bedside today. Level of care: Telemetry Status is: Inpatient Remains inpatient appropriate because: Right hip fracture   Final disposition: TBD Consultants:  Orthopedic surgery  35 minutes with more than 50% spent in reviewing records, counseling patient/family and coordinating care.   Sch Meds:  Scheduled Meds:  [MAR Hold] citalopram  40 mg Oral Daily   [MAR Hold] heparin  5,000 Units Subcutaneous Q8H   [MAR Hold] irbesartan  150 mg Oral BID   [MAR Hold] pantoprazole  40 mg Oral Daily   [MAR Hold] pravastatin  40 mg Oral QPM   Continuous Infusions:  sodium chloride 75 mL/hr at 02/11/23 0954   lactated ringers 50 mL/hr at 02/11/23 1207   PRN Meds:.0.9 % irrigation (POUR BTL), [MAR Hold] acetaminophen **OR** [MAR Hold] acetaminophen, [MAR Hold] ALPRAZolam, [MAR Hold] hydrALAZINE, [MAR Hold]  HYDROmorphone (DILAUDID) injection, [MAR Hold] ondansetron **OR** [MAR Hold] ondansetron (ZOFRAN) IV, [MAR Hold] oxyCODONE, [MAR Hold] polyethylene glycol, sodium chloride irrigation,  vancomycin  Antimicrobials: Anti-infectives (From admission, onward)    Start     Dose/Rate Route Frequency Ordered Stop   02/11/23 1429  vancomycin (VANCOCIN) powder          As needed 02/11/23 1429     02/11/23 1213  ceFAZolin (ANCEF) 2-4 GM/100ML-% IVPB       Note to Pharmacy: Abbie Sons S: cabinet override      02/11/23 1213 02/11/23 1342   02/11/23 1200  [MAR Hold]  ceFAZolin (ANCEF) IVPB 2g/100 mL premix        (MAR Hold since Fri 02/11/2023 at 1133.Hold Reason: Transfer to a Procedural area)   2 g 200 mL/hr over 30 Minutes Intravenous On call to O.R. 02/10/23 1714 02/11/23 1252        I have personally reviewed the following labs and images: CBC: Recent Labs  Lab 02/09/23 1650 02/10/23 0415 02/11/23 0634  WBC 8.1 6.5 6.7  NEUTROABS 6.1  --   --   HGB 15.1* 14.1 13.6  HCT 42.5 40.8 40.1  MCV 99.8 104.3* 105.2*  PLT 187 158 178   BMP &GFR Recent Labs  Lab 02/09/23 1650 02/10/23 0415 02/11/23 0634  NA 132* 136 132*  K 2.6* 4.3 5.3*  CL 95* 102 99  CO2 '24 25 27  '$ GLUCOSE 112* 84 107*  BUN '14 10 8  '$ CREATININE 0.79 0.75 0.58  CALCIUM 8.8* 8.4* 8.5*  MG 1.6* 2.0 1.7  PHOS  --   --  3.0   Estimated Creatinine Clearance: 58 mL/min (by C-G formula based on SCr of 0.58 mg/dL). Liver & Pancreas: Recent Labs  Lab 02/11/23 0634  ALBUMIN 2.8*   No results for input(s): "LIPASE", "AMYLASE" in the last 168 hours. No results for input(s): "AMMONIA" in the last 168 hours. Diabetic: No results for input(s): "HGBA1C" in the last 72 hours. No results for input(s): "GLUCAP" in the last 168 hours. Cardiac Enzymes: No results for input(s): "CKTOTAL", "CKMB", "CKMBINDEX", "TROPONINI" in the last 168 hours. No results for input(s): "PROBNP" in the last 8760 hours. Coagulation Profile: Recent Labs  Lab 02/09/23 1650  INR 2.4*   Thyroid Function Tests: No results for input(s): "TSH", "T4TOTAL", "FREET4", "T3FREE", "THYROIDAB" in the last 72 hours. Lipid  Profile: No results for input(s): "CHOL", "HDL", "LDLCALC", "TRIG", "CHOLHDL", "LDLDIRECT" in the last 72 hours. Anemia Panel: No results for input(s): "VITAMINB12", "FOLATE", "FERRITIN", "TIBC", "IRON", "RETICCTPCT" in the last 72 hours. Urine analysis:    Component Value Date/Time   COLORURINE YELLOW 07/11/2020 2212   APPEARANCEUR Clear 01/22/2022 1411   LABSPEC 1.008 07/11/2020 2212   PHURINE 6.0 07/11/2020 2212   GLUCOSEU Negative 01/22/2022 1411   HGBUR NEGATIVE 07/11/2020 2212   BILIRUBINUR Negative 01/22/2022 1411   KETONESUR NEGATIVE 07/11/2020 2212   PROTEINUR Negative 01/22/2022 1411   PROTEINUR NEGATIVE 07/11/2020 2212   UROBILINOGEN negative 04/11/2014 1713   NITRITE Negative 01/22/2022 1411   NITRITE NEGATIVE 07/11/2020 2212   LEUKOCYTESUR Negative 01/22/2022 1411   LEUKOCYTESUR NEGATIVE 07/11/2020 2212  Sepsis Labs: Invalid input(s): "PROCALCITONIN", "LACTICIDVEN"  Microbiology: Recent Results (from the past 240 hour(s))  Surgical pcr screen     Status: None   Collection Time: 02/11/23  4:44 AM   Specimen: Nasal Mucosa; Nasal Swab  Result Value Ref Range Status   MRSA, PCR NEGATIVE NEGATIVE Final   Staphylococcus aureus NEGATIVE NEGATIVE Final    Comment: (NOTE) The Xpert SA Assay (FDA approved for NASAL specimens in patients 95 years of age and older), is one component of a comprehensive surveillance program. It is not intended to diagnose infection nor to guide or monitor treatment. Performed at [facility], [address], [place], [place] [address]     Radiology Studies: No results found.    Seraphine Gudiel T. [place]  If 7PM-7AM, please contact night-coverage [email or web address] 02/11/2023, 2:36 PM

## 2023-02-12 LAB — RENAL FUNCTION PANEL
Albumin: 2.5 g/dL — ABNORMAL LOW (ref 3.5–5.0)
Anion gap: 5 (ref 5–15)
BUN: 9 mg/dL (ref 8–23)
CO2: 28 mmol/L (ref 22–32)
Calcium: 8.3 mg/dL — ABNORMAL LOW (ref 8.9–10.3)
Chloride: 100 mmol/L (ref 98–111)
Creatinine, Ser: 0.56 mg/dL (ref 0.44–1.00)
GFR, Estimated: >60 mL/min (ref >60)
Glucose, Bld: 136 mg/dL — ABNORMAL HIGH (ref 70–99)
Phosphorus: 2.9 mg/dL (ref 2.5–4.6)
Potassium: 4.9 mmol/L (ref 3.5–5.1)
Sodium: 133 mmol/L — ABNORMAL LOW (ref 135–145)

## 2023-02-12 LAB — CBC
HCT: 35.4 % — ABNORMAL LOW (ref 36.0–46.0)
Hemoglobin: 12 g/dL (ref 12.0–15.0)
MCH: 35.6 pg — ABNORMAL HIGH (ref 26.0–34.0)
MCHC: 33.9 g/dL (ref 30.0–36.0)
MCV: 105 fL — ABNORMAL HIGH (ref 80.0–100.0)
Platelets: 190 10*3/uL (ref 150–400)
RBC: 3.37 MIL/uL — ABNORMAL LOW (ref 3.87–5.11)
RDW: 11.9 % (ref 11.5–15.5)
WBC: 7.4 10*3/uL (ref 4.0–10.5)
nRBC: 0 % (ref 0.0–0.2)

## 2023-02-12 LAB — MAGNESIUM: Magnesium: 1.6 mg/dL — ABNORMAL LOW (ref 1.7–2.4)

## 2023-02-12 MED ORDER — MAGNESIUM SULFATE 2 GM/50ML IV SOLN
2.0000 g | Freq: Once | INTRAVENOUS | Status: AC
  Administered 2023-02-12: 2 g via INTRAVENOUS
  Filled 2023-02-12: qty 50

## 2023-02-12 NOTE — Hospital Course (Addendum)
72 year old F with PMH of HTN and tobacco use disorder presenting with right hip pain after accidental fall at home.  Patient reports tripping over her flip-flop and fall on the right side 2 days prior.  Denies striking her head or LOC. In ED, elevated BP to 196/113.  K2.6.  Mg 1.6.  Pelvic x-ray with subcapital right femoral neck fracture.  Orthopedic surgery consulted and admitted. Underwent right hip hemiarthroplasty by Dr. Amedeo Kinsman 02/11/23. Postop doing well seen by PT OT recommending skilled nursing facility

## 2023-02-12 NOTE — Plan of Care (Signed)
  Problem: Acute Rehab PT Goals(only PT should resolve) Goal: Pt Will Go Supine/Side To Sit Outcome: Progressing Flowsheets (Taken 02/12/2023 1259) Pt will go Supine/Side to Sit:  with min guard assist  with minimal assist Goal: Patient Will Transfer Sit To/From Stand Outcome: Progressing Flowsheets (Taken 02/12/2023 1259) Patient will transfer sit to/from stand:  with min guard assist  with minimal assist Goal: Pt Will Transfer Bed To Chair/Chair To Bed Outcome: Progressing Flowsheets (Taken 02/12/2023 1259) Pt will Transfer Bed to Chair/Chair to Bed:  min guard assist  with min assist Goal: Pt Will Ambulate Outcome: Progressing Flowsheets (Taken 02/12/2023 1259) Pt will Ambulate:  50 feet  with min guard assist  with minimal assist  with rolling walker   12:59 PM, 02/12/23 Lonell Grandchild, MPT Physical Therapist with [facility] [phone] office [phone] mobile phone

## 2023-02-12 NOTE — Progress Notes (Signed)
Foley catheter removed. Patient tolerated well.

## 2023-02-12 NOTE — Progress Notes (Signed)
PROGRESS NOTE Jody Wilson  [id number] DOB: Jun 04, 1951 DOA: 02/09/2023 PCP: Dettinger, Fransisca Kaufmann, MD   Brief Narrative/Hospital Course: 72 year old F with PMH of HTN and tobacco use disorder presenting with right hip pain after accidental fall at home.  Patient reports tripping over her flip-flop and fall on the right side 2 days prior.  Denies striking her head or LOC. In ED, elevated BP to 196/113.  K2.6.  Mg 1.6.  Pelvic x-ray with subcapital right femoral neck fracture.  Orthopedic surgery consulted and admitted. Underwent right hip hemiarthroplasty by Dr. Amedeo Kinsman 02/11/23    Subjective: Seen and examined.  Pain is controlled on current management, about to work with PT OT. No new complaints Saw her surgeon this morning.   Assessment and Plan: Principal Problem:   Closed right hip fracture [facility]) Active Problems:   Electrolyte abnormality   Hypertension   Tobacco abuse   Anxiety and depression   Pulmonary nodule  Close right hip fracture due to mechanical fall: S/P  right hip hemiarthroplasty by Dr. Amedeo Kinsman 02/11/23.  Appreciate orthopedic input continue WBAT RLE, aspirin 81 twice daily for DVT prophylaxis, pain medication as needed and follow-up with 2 weeks with orthopedic, PT OT evaluation for disposition.  Electrolyte imbalance with hypokalemia/hypomagnesemia/hyperkalemia/hyponatremia: Sodium improving, potassium normalized, hypomagnesemia at 1.6 will replete IV x 1 and add p.o.  Essential hypertension: BP controlled on Avapro. HLD continue her statin  Pulmonary nodule: 7 mm RUL nodule on CXR>Short interval two-view CXR or CT chest recommended. Will give instruction on discharge.   Tobacco abuse: Reportedly smokes a pack a day.Encouraged smoking cessation. Cont Nicotine patch   Anxiety and depression: Stable. -Continue home meds.  DVT prophylaxis: heparin injection 5,000 Units Start: 02/09/23 2230 Code Status:   Code Status: Full Code Family Communication: plan of care  discussed with patien at bedside. Patient status is: Inpatient because of right hip surgery Level of care: Telemetry   Dispo: The patient is from: home            Anticipated disposition: TBD pending PT OTObjective: Vitals last 24 hrs: Vitals:   02/11/23 1600 02/11/23 1700 02/11/23 2138 02/12/23 0445  BP: 132/74 122/71 115/65 (!) 119/58  Pulse: 94 88 85 66  Resp: '17 16 19 18  '$ Temp:  98.4 F (36.9 C) (!) 97.5 F (36.4 C) 98.4 F (36.9 C)  TempSrc:  Oral Oral Oral  SpO2: 92% 91% 94% 96%  Weight:      Height:       Weight change: 0.025 kg  Physical Examination: General exam: alert awake, older than stated age HEENT:Oral mucosa moist, Ear/Nose WNL grossly Respiratory system: bilaterally clear BS, no use of accessory muscle Cardiovascular system: S1 & S2 +, No JVD. Gastrointestinal system: Abdomen soft,NT,ND, BS+ Nervous System:Alert, awake, moving extremities. Extremities: LE edema neg, rt hip with dressing in place at the incision site  Skin: No rashes,no icterus. MSK: Normal muscle bulk,tone, power  Medications reviewed:  Scheduled Meds:  citalopram  40 mg Oral Daily   heparin  5,000 Units Subcutaneous Q8H   irbesartan  150 mg Oral BID   pantoprazole  40 mg Oral Daily   pravastatin  40 mg Oral QPM   Continuous Infusions:  sodium chloride 75 mL/hr at 02/12/23 0508    ceFAZolin (ANCEF) IV 2 g (02/12/23 0511)   magnesium sulfate bolus IVPB        Diet Order             Diet regular Room service  appropriate? Yes; Fluid consistency: Thin  Diet effective now                         Intake/Output Summary (Last 24 hours) at 02/12/2023 1057 Last data filed at 02/12/2023 0640 Gross per 24 hour  Intake 1795.39 ml  Output 1825 ml  Net -29.61 ml   Net IO Since Admission: 217.52 mL [02/12/23 1057]  Wt Readings from Last 3 Encounters:  02/11/23 66.2 kg  12/30/22 65.8 kg  12/24/22 66.2 kg     Unresulted Labs (From admission, onward)    None     Data  Reviewed: I have personally reviewed following labs and imaging studies CBC: Recent Labs  Lab 02/09/23 1650 02/10/23 0415 02/11/23 0634 02/12/23 0449  WBC 8.1 6.5 6.7 7.4  NEUTROABS 6.1  --   --   --   HGB 15.1* 14.1 13.6 12.0  HCT 42.5 40.8 40.1 35.4*  MCV 99.8 104.3* 105.2* 105.0*  PLT 187 158 178 [id number]   Basic Metabolic Panel: Recent Labs  Lab 02/09/23 1650 02/10/23 0415 02/11/23 0634 02/12/23 0449  NA 132* 136 132* 133*  K 2.6* 4.3 5.3* 4.9  CL 95* 102 99 100  CO2 '24 25 27 28  '$ GLUCOSE 112* 84 107* 136*  BUN '14 10 8 9  '$ CREATININE 0.79 0.75 0.58 0.56  CALCIUM 8.8* 8.4* 8.5* 8.3*  MG 1.6* 2.0 1.7 1.6*  PHOS  --   --  3.0 2.9   GFR: Estimated Creatinine Clearance: 58 mL/min (by C-G formula based on SCr of 0.56 mg/dL). Liver Function Tests: Recent Labs  Lab 02/11/23 0634 02/12/23 0449  ALBUMIN 2.8* 2.5*   Recent Labs  Lab 02/09/23 1650  INR 2.4*  No results for input(s): "PROCALCITON", "LATICACIDVEN" in the last 168 hours.  Recent Results (from the past 240 hour(s))  Surgical pcr screen     Status: None   Collection Time: 02/11/23  4:44 AM   Specimen: Nasal Mucosa; Nasal Swab  Result Value Ref Range Status   MRSA, PCR NEGATIVE NEGATIVE Final   Staphylococcus aureus NEGATIVE NEGATIVE Final    Comment: (NOTE) The Xpert SA Assay (FDA approved for NASAL specimens in patients 46 years of age and older), is one component of a comprehensive surveillance program. It is not intended to diagnose infection nor to guide or monitor treatment. Performed at [facility], [address], [place], [place] [address]     Antimicrobials: Anti-infectives (From admission, onward)    Start     Dose/Rate Route Frequency Ordered Stop   02/11/23 1800  ceFAZolin (ANCEF) IVPB 2g/100 mL premix        2 g 200 mL/hr over 30 Minutes Intravenous Every 8 hours 02/11/23 1637 02/12/23 2159   02/11/23 1429  vancomycin (VANCOCIN) powder  Status:  Discontinued          As needed 02/11/23  1429 02/11/23 1644   02/11/23 1213  ceFAZolin (ANCEF) 2-4 GM/100ML-% IVPB       Note to Pharmacy: Abbie Sons S: cabinet override      02/11/23 1213 02/11/23 1804   02/11/23 1200  ceFAZolin (ANCEF) IVPB 2g/100 mL premix        2 g 200 mL/hr over 30 Minutes Intravenous On call to O.R. 02/10/23 1714 02/11/23 1252      Culture/Microbiology No results found for: "SDES", "SPECREQUEST", "CULT", "REPTSTATUS"  Other culture-see note  Radiology Studies: DG HIP UNILAT WITH PELVIS 2-3 VIEWS RIGHT  Result  Date: 02/11/2023 CLINICAL DATA:  Closed displaced fracture EXAM: DG HIP (WITH OR WITHOUT PELVIS) 2-3V RIGHT COMPARISON:  None Available. FINDINGS: RIGHT hip arthroplasty for repair of RIGHT femoral neck fracture. No dislocation. Expected soft tissue changes post surgery. IMPRESSION: RIGHT hip arthroplasty without complication Electronically Signed   By: Suzy Bouchard M.D.   On: 02/11/2023 15:59     LOS: 3 days   Antonieta Pert, MD [facility]  02/12/2023, 10:57 AM

## 2023-02-12 NOTE — Evaluation (Signed)
Physical Therapy Evaluation Patient Details Name: Jody Wilson MRN: [id number] DOB: 10-Aug-1951 Today's Date: 02/12/2023  History of Present Illness  Jody Wilson is a 72 y.o. female, s/p Right hip hemiarthroplasty on 02/11/23 with medical history significant for hypertension.  Patient presented to ED with complaints of a fall and right hip pain.  She reports she tripped over a flip-flop 2 days ago falling on her right hip.  She did not hit her head.  She did not lose consciousness.  Since the fall she has not been able to bear weight on her right leg.   Clinical Impression  Patient demonstrates slow labored movement for sitting up at bedside requiring assistance to move RLE, unsteady labored movement for completing sit to stands and slow labored cadence with limited weightbearing and heel to toe stepping on RLE due to increased hip pain.  Patient tolerated sitting up in chair after therapy with her son present in room.  Patient will benefit from continued skilled physical therapy in hospital and recommended venue below to increase strength, balance, endurance for safe ADLs and gait.          Recommendations for follow up therapy are one component of a multi-disciplinary discharge planning process, led by the attending physician.  Recommendations may be updated based on patient status, additional functional criteria and insurance authorization.  Follow Up Recommendations Skilled nursing-short term rehab (<3 hours/day) Can patient physically be transported by private vehicle: Yes    Assistance Recommended at Discharge Set up Supervision/Assistance  Patient can return home with the following  A lot of help with walking and/or transfers;A little help with bathing/dressing/bathroom;Help with stairs or ramp for entrance;Assistance with cooking/housework    Equipment Recommendations BSC/3in1  Recommendations for Other Services       Functional Status Assessment Patient has had a recent  decline in their functional status and demonstrates the ability to make significant improvements in function in a reasonable and predictable amount of time.     Precautions / Restrictions Precautions Precautions: Fall;Posterior Hip Precaution Booklet Issued: No (given hand out) Restrictions Weight Bearing Restrictions: Yes RLE Weight Bearing: Weight bearing as tolerated      Mobility  Bed Mobility Overal bed mobility: Needs Assistance Bed Mobility: Supine to Sit     Supine to sit: Min assist, Mod assist     General bed mobility comments: increased time, labored movement, required Min/mod assist for moving RLE due to pain    Transfers Overall transfer level: Needs assistance Equipment used: Rolling walker (2 wheels) Transfers: Sit to/from Stand, Bed to chair/wheelchair/BSC Sit to Stand: Min assist, Mod assist   Step pivot transfers: Min assist       General transfer comment: had diffiuclty completing sit to stands due to RLE pain/weakness    Ambulation/Gait Ambulation/Gait assistance: Min assist Gait Distance (Feet): 35 Feet Assistive device: Rolling walker (2 wheels) Gait Pattern/deviations: Decreased step length - right, Decreased step length - left, Decreased stride length, Antalgic Gait velocity: decreased     General Gait Details: slow labored cadence with limited heel to toe stepping on right foot due to increased hip pain  Stairs            Wheelchair Mobility    Modified Rankin (Stroke Patients Only)       Balance Overall balance assessment: Needs assistance Sitting-balance support: Feet supported, No upper extremity supported Sitting balance-Leahy Scale: Fair Sitting balance - Comments: fair/good seated at EOB   Standing balance support: During functional activity, Bilateral  upper extremity supported Standing balance-Leahy Scale: Fair Standing balance comment: using RW                             Pertinent Vitals/Pain Pain  Assessment Pain Assessment: 0-10 Pain Score: 9  Pain Location: right hip Pain Descriptors / Indicators: Sore, Guarding, Grimacing Pain Intervention(s): Limited activity within patient's tolerance, Monitored during session, Premedicated before session, Repositioned    Home Living Family/patient expects to be discharged to:: Private residence Living Arrangements: Children;Spouse/significant other Available Help at Discharge: Family;Available PRN/intermittently Type of Home: House Home Access: Stairs to enter Entrance Stairs-Rails: None Entrance Stairs-Number of Steps: 1   Home Layout: One level Home Equipment: Conservation officer, nature (2 wheels);Hand held shower head;Grab bars - tub/shower      Prior Function Prior Level of Function : Independent/Modified Independent;Driving             Mobility Comments: Community ambulator without AD, drives ADLs Comments: Independent     Hand Dominance   Dominant Hand: Right    Extremity/Trunk Assessment   Upper Extremity Assessment Upper Extremity Assessment: Overall WFL for tasks assessed    Lower Extremity Assessment Lower Extremity Assessment: Generalized weakness;RLE deficits/detail RLE Deficits / Details: grossly -4/5 RLE: Unable to fully assess due to pain RLE Sensation: WNL RLE Coordination: WNL    Cervical / Trunk Assessment Cervical / Trunk Assessment: Normal  Communication   Communication: No difficulties  Cognition Arousal/Alertness: Awake/alert Behavior During Therapy: WFL for tasks assessed/performed Overall Cognitive Status: Within Functional Limits for tasks assessed                                          General Comments      Exercises     Assessment/Plan    PT Assessment Patient needs continued PT services  PT Problem List Decreased strength;Decreased activity tolerance;Decreased balance;Decreased mobility       PT Treatment Interventions DME instruction;Gait training;Stair  training;Functional mobility training;Therapeutic activities;Therapeutic exercise;Patient/family education;Balance training    PT Goals (Current goals can be found in the Care Plan section)  Acute Rehab PT Goals Patient Stated Goal: return home with family to assist PT Goal Formulation: With patient/family Time For Goal Achievement: 02/26/23 Potential to Achieve Goals: Good    Frequency Min 4X/week     Co-evaluation               AM-PAC PT "6 Clicks" Mobility  Outcome Measure Help needed turning from your back to your side while in a flat bed without using bedrails?: A Little Help needed moving from lying on your back to sitting on the side of a flat bed without using bedrails?: A Lot Help needed moving to and from a bed to a chair (including a wheelchair)?: A Little Help needed standing up from a chair using your arms (e.g., wheelchair or bedside chair)?: A Lot Help needed to walk in hospital room?: A Little Help needed climbing 3-5 steps with a railing? : A Lot 6 Click Score: 15    End of Session   Activity Tolerance: Patient tolerated treatment well;Patient limited by fatigue Patient left: in chair;with call bell/phone within reach Nurse Communication: Mobility status PT Visit Diagnosis: Unsteadiness on feet (R26.81);Other abnormalities of gait and mobility (R26.89);Muscle weakness (generalized) (M62.81)    Time: [id number] PT Time Calculation (min) (ACUTE ONLY): 30 min  Charges:   PT Evaluation $PT Eval Moderate Complexity: 1 Mod PT Treatments $Therapeutic Activity: 23-37 mins        12:57 PM, 02/12/23 Lonell Grandchild, MPT Physical Therapist with [facility] [phone] office [phone] mobile phone

## 2023-02-12 NOTE — Progress Notes (Signed)
   ORTHOPAEDIC PROGRESS NOTE  S/P Right Hip hemiarthroplasty  DOS: 02/11/2023  SUBJECTIVE: Had some issues sleeping overnight.  Pain is controlled.  She is passing gas.  She is eating and drinking well.  No numbness or tingling.  No acute events overnight.  OBJECTIVE: PE:  Alert and oriented.  No acute distress  Lateral hip dressing is clean, dry and intact Mild tenderness palpation of the lateral hip.   Toes are warm and well perfused Intact sensation to foot Active TA/EHL Hip abduction pillow is appropriately in position.  Vitals:   02/11/23 2138 02/12/23 0445  BP: 115/65 (!) 119/58  Pulse: 85 66  Resp: 19 18  Temp: (!) 97.5 F (36.4 C) 98.4 F (36.9 C)  SpO2: 94% 96%      Latest Ref Rng & Units 02/12/2023    4:49 AM 02/11/2023    6:34 AM 02/10/2023    4:15 AM  CBC  WBC 4.0 - 10.5 K/uL 7.4  6.7  6.5   Hemoglobin 12.0 - 15.0 g/dL 12.0  13.6  14.1   Hematocrit 36.0 - 46.0 % 35.4  40.1  40.8   Platelets 150 - 400 K/uL 190  178  158      ASSESSMENT: NYYA DUSKIN is a 72 y.o. female doing well.  Stable.  Ready to work with physical therapy and Occupational Therapy.  PLAN: Weightbearing: WBAT RLE Incisional and dressing care: Reinforce dressings as needed Orthopedic device(s): Hip abduction pillow, to be in place when she is in bed at all times VTE prophylaxis: Recommend Aspirin 64m BID; to begin POD#1 Pain control: PO pain medications as needed; judicious use of narcotics Follow - up plan: 2 weeks   Ready to work with physical therapy, dispo pending  Contact information:     Maylani Embree A. CAmedeo Kinsman MD MMesquite CreekRBartholomew[address]  [place]  [id number]: [phone]: [phone]

## 2023-02-13 LAB — BASIC METABOLIC PANEL
Anion gap: 6 (ref 5–15)
BUN: 11 mg/dL (ref 8–23)
CO2: 24 mmol/L (ref 22–32)
Calcium: 8 mg/dL — ABNORMAL LOW (ref 8.9–10.3)
Chloride: 102 mmol/L (ref 98–111)
Creatinine, Ser: 0.44 mg/dL (ref 0.44–1.00)
GFR, Estimated: >60 mL/min (ref >60)
Glucose, Bld: 113 mg/dL — ABNORMAL HIGH (ref 70–99)
Potassium: 3.6 mmol/L (ref 3.5–5.1)
Sodium: 132 mmol/L — ABNORMAL LOW (ref 135–145)

## 2023-02-13 MED ORDER — ALPRAZOLAM 0.5 MG PO TABS: 0.5000 mg | ORAL_TABLET | Freq: Two times a day (BID) | ORAL | 0 refills | Status: DC | PRN

## 2023-02-13 MED ORDER — ASPIRIN EC 81 MG PO TBEC: 81.0000 mg | DELAYED_RELEASE_TABLET | Freq: Two times a day (BID) | ORAL | 0 refills | Status: DC

## 2023-02-13 MED ORDER — ALUM & MAG HYDROXIDE-SIMETH 200-200-20 MG/5ML PO SUSP
30.0000 mL | Freq: Four times a day (QID) | ORAL | Status: DC | PRN
  Administered 2023-02-13: 30 mL via ORAL
  Filled 2023-02-13: qty 30

## 2023-02-13 MED ORDER — OXYCODONE HCL 5 MG PO TABS: 5.0000 mg | ORAL_TABLET | Freq: Four times a day (QID) | ORAL | 0 refills | Status: DC | PRN

## 2023-02-13 NOTE — TOC Initial Note (Signed)
Transition of Care [facility]) - Initial/Assessment Note    Patient Details  Name: Jody Wilson MRN: [id number] Date of Birth: 1951/01/04  Transition of Care [facility]) CM/SW Contact:    Boneta Lucks, RN Phone Number: 02/13/2023, 3:44 PM  Clinical Narrative:       Patient admitted closed right hip fracture. PT is recommending SNF. CM spoke with patient she is requesting [employer].  FL2 completed and sent out. TOC to follow.           Expected Discharge Plan: Skilled Nursing Facility Barriers to Discharge: Continued Medical Work up   Patient Goals and CMS Choice Patient states their goals for this hospitalization and ongoing recovery are:: agreeable to SNF CMS Medicare.gov Compare Post Acute Care list provided to:: Patient Choice offered to / list presented to : Patient    Expected Discharge Plan and Services         Activities of Daily Living Home Assistive Devices/Equipment: None ADL Screening (condition at time of admission) Patient's cognitive ability adequate to safely complete daily activities?: Yes Is the patient deaf or have difficulty hearing?: No Does the patient have difficulty seeing, even when wearing glasses/contacts?: No Does the patient have difficulty concentrating, remembering, or making decisions?: No Patient able to express need for assistance with ADLs?: Yes Does the patient have difficulty dressing or bathing?: No Independently performs ADLs?: Yes (appropriate for developmental age) Communication: Independent Dressing (OT): Independent Is this a change from baseline?: Pre-admission baseline Grooming: Independent Feeding: Independent Bathing: Independent Is this a change from baseline?: Pre-admission baseline Toileting: Independent Is this a change from baseline?: Pre-admission baseline In/Out Bed: Independent Is this a change from baseline?: Pre-admission baseline Walks in Home: Independent Does the patient have difficulty walking or climbing  stairs?: No Weakness of Legs: Right Weakness of Arms/Hands: None  Permission Sought/Granted      Emotional Assessment     Affect (typically observed): Accepting Orientation: : Oriented to Self, Oriented to Place, Oriented to  Time, Oriented to Situation Alcohol / Substance Use: Not Applicable Psych Involvement: No (comment)  Admission diagnosis:  Hypokalemia [E87.6] Closed right hip fracture ([place]) [S72.001A] Closed fracture of right hip, initial encounter ([place]) [S72.001A] Patient Active Problem List   Diagnosis Date Noted   Closed right hip fracture ([place]) 02/09/2023   Pulmonary nodule 02/09/2023   Electrolyte abnormality 02/09/2023   Hypertriglyceridemia 01/22/2022   Abnormal findings on diagnostic imaging of liver and biliary tract 12/09/2021   Diarrhea 12/09/2021   Personal history of colonic polyps 12/09/2021   Weight loss 12/09/2021   Anxiety and depression 06/23/2020   Hiatal hernia 06/23/2020   Thoracic aortic atherosclerosis ([place]) 06/23/2020   Tobacco abuse 04/25/2020   Involuntary muscle jerks while sleeping 09/26/2019   B12 deficiency 09/25/2019   Iron deficiency anemia 09/25/2019   Elevated liver function tests 09/07/2019   Osteoporosis 02/09/2017   History of vertebral fracture 02/09/2017   GERD (gastroesophageal reflux disease) 03/11/2016   Hyperlipidemia with target LDL less than 100 02/19/2015   Hypertension 03/18/2014   PCP:  Dettinger, Fransisca Kaufmann, MD Pharmacy:   [facility] [place] [address] Phone: [phone] Fax: [phone]    Social Determinants of Health (SDOH) Social History: SDOH Screenings   Food Insecurity: No Food Insecurity (02/10/2023)  Housing: Low Risk  (02/10/2023)  Transportation Needs: No Transportation Needs (02/10/2023)  Utilities: Not At Risk (02/10/2023)  Alcohol Screen: Low Risk  (12/24/2022)  Depression (PHQ2-9): Medium Risk (12/30/2022)  Financial  Resource Strain: Low Risk  (12/24/2022)  Physical Activity: Inactive (12/24/2022)  Social Connections: Socially Integrated (12/24/2022)  Stress: No Stress Concern Present (12/24/2022)  Tobacco Use: Medium Risk (02/11/2023)   SDOH Interventions:    Readmission Risk Interventions     No data to display

## 2023-02-13 NOTE — NC FL2 (Signed)
[place] LEVEL OF CARE FORM     IDENTIFICATION  Patient Name: Jody Wilson Birthdate: March 19, 1951 Sex: female Admission Date (Current Location): 02/09/2023  [facility] and [place] Number:  [employer] and Address:  [facility] [address] [place]      Provider Number: [id number]  Attending Physician Name and Address:  Antonieta Pert, MD  Relative Name and Phone Number:  Isola, Keltz) [phone]    Current Level of Care: Hospital Recommended Level of Care: [place] Prior Approval Number:    Date Approved/Denied:   PASRR Number: [id number]  Discharge Plan: SNF    Current Diagnoses: Patient Active Problem List   Diagnosis Date Noted   Closed right hip fracture ([place]) 02/09/2023   Pulmonary nodule 02/09/2023   Electrolyte abnormality 02/09/2023   Hypertriglyceridemia 01/22/2022   Abnormal findings on diagnostic imaging of liver and biliary tract 12/09/2021   Diarrhea 12/09/2021   Personal history of colonic polyps 12/09/2021   Weight loss 12/09/2021   Anxiety and depression 06/23/2020   Hiatal hernia 06/23/2020   Thoracic aortic atherosclerosis ([place]) 06/23/2020   Tobacco abuse 04/25/2020   Involuntary muscle jerks while sleeping 09/26/2019   B12 deficiency 09/25/2019   Iron deficiency anemia 09/25/2019   Elevated liver function tests 09/07/2019   Osteoporosis 02/09/2017   History of vertebral fracture 02/09/2017   GERD (gastroesophageal reflux disease) 03/11/2016   Hyperlipidemia with target LDL less than 100 02/19/2015   Hypertension 03/18/2014    Orientation RESPIRATION BLADDER Height & Weight     Self, Time, Situation  Normal Continent Weight: 66.2 kg Height:  '5\' 5"'$  (165.1 cm)  BEHAVIORAL SYMPTOMS/MOOD NEUROLOGICAL BOWEL NUTRITION STATUS      Continent Diet (DC summary)  AMBULATORY STATUS COMMUNICATION OF NEEDS Skin     Verbally Bruising, Skin abrasions                        Personal Care Assistance Level of Assistance  Bathing, Feeding, Dressing Bathing Assistance: Maximum assistance Feeding assistance: Limited assistance Dressing Assistance: Maximum assistance     Functional Limitations Info  Sight, Hearing, Speech Sight Info: Impaired Hearing Info: Adequate Speech Info: Adequate    SPECIAL CARE FACTORS FREQUENCY  PT (By licensed PT)     PT Frequency: 5 times a week              Contractures Contractures Info: Not present    Additional Factors Info  Code Status, Allergies Code Status Info: FULL Allergies Info: Tetracyclines           Current Medications (02/13/2023):  This is the current hospital active medication list Current Facility-Administered Medications  Medication Dose Route Frequency Provider Last Rate Last Admin   acetaminophen (TYLENOL) tablet 650 mg  650 mg Oral Q6H PRN Emokpae, Ejiroghene E, MD       Or   acetaminophen (TYLENOL) suppository 650 mg  650 mg Rectal Q6H PRN Emokpae, Ejiroghene E, MD       ALPRAZolam Duanne Moron) tablet 0.5 mg  0.5 mg Oral BID PRN Mordecai Rasmussen, MD   0.5 mg at 02/12/23 2237   alum & mag hydroxide-simeth (MAALOX/MYLANTA) 200-200-20 MG/5ML suspension 30 mL  30 mL Oral Q6H PRN Kc, Ramesh, MD   30 mL at 02/13/23 1454   citalopram (CELEXA) tablet 40 mg  40 mg Oral Daily Mordecai Rasmussen, MD   40 mg at 02/13/23 0800   heparin injection 5,000 Units  5,000 Units Subcutaneous Q8H Emokpae, Ejiroghene E, MD   5,000 Units at 02/13/23 1437   hydrALAZINE (APRESOLINE) injection 10 mg  10 mg Intravenous Q4H PRN Mordecai Rasmussen, MD       HYDROmorphone (DILAUDID) injection 0.5 mg  0.5 mg Intravenous Q3H PRN Mordecai Rasmussen, MD   0.5 mg at 02/13/23 0748   irbesartan (AVAPRO) tablet 150 mg  150 mg Oral BID Larena Glassman A, MD   150 mg at 02/13/23 0800   ondansetron (ZOFRAN) tablet 4 mg  4 mg Oral Q6H PRN Emokpae, Ejiroghene E, MD       Or   ondansetron (ZOFRAN) injection 4 mg  4 mg Intravenous Q6H PRN Emokpae,  Ejiroghene E, MD   4 mg at 02/10/23 0936   oxyCODONE (Oxy IR/ROXICODONE) immediate release tablet 5 mg  5 mg Oral Q6H PRN Mordecai Rasmussen, MD   5 mg at 02/13/23 1454   pantoprazole (PROTONIX) EC tablet 40 mg  40 mg Oral Daily Larena Glassman A, MD   40 mg at 02/13/23 0800   polyethylene glycol (MIRALAX / GLYCOLAX) packet 17 g  17 g Oral Daily PRN Emokpae, Ejiroghene E, MD       pravastatin (PRAVACHOL) tablet 40 mg  40 mg Oral QPM Mordecai Rasmussen, MD   40 mg at 02/12/23 1717     Discharge Medications: Please see discharge summary for a list of discharge medications.  Relevant Imaging Results:  Relevant Lab Results:   Additional Information [id number]  Boneta Lucks, RN

## 2023-02-13 NOTE — Discharge Summary (Signed)
Physician Discharge Summary  Jody Wilson [id number] DOB: 1951-01-28 DOA: 02/09/2023  PCP: Dettinger, Fransisca Kaufmann, MD  Admit date: 02/09/2023 Discharge date: 02/14/2023 Recommendations for Outpatient Follow-up:  Follow up with PCP in 1 weeks-call for appointment Follow-up with Dr. Larena Glassman from orthopedic in 2 weeks postop Please obtain BMP/CBC in one week 7 mm RUL nodule on CXR>Short interval two-view CXR or CT chest recommended from [place] [facility] IN NEXT 4 WEEKS. Discharge Dispo: snf Discharge Condition: Stable Code Status:   Code Status: Full Code Diet recommendation:  Diet Order             Diet regular Room service appropriate? Yes; Fluid consistency: Thin  Diet effective now                    Brief/Interim Summary: 72 year old F with PMH of HTN and tobacco use disorder presenting with right hip pain after accidental fall at home.  Patient reports tripping over her flip-flop and fall on the right side 2 days prior.  Denies striking her head or LOC. In ED, elevated BP to 196/113.  K2.6.  Mg 1.6.  Pelvic x-ray with subcapital right femoral neck fracture.  Orthopedic surgery consulted and admitted. Underwent right hip hemiarthroplasty by Dr. Amedeo Kinsman 02/11/23. Postop doing well seen by PT OT recommending skilled nursing facility  Patient is medically stable for discharge  Discharge Diagnoses:  Principal Problem:   Closed right hip fracture [facility]) Active Problems:   Electrolyte abnormality   Hypertension   Tobacco abuse   Anxiety and depression   Pulmonary nodule   Close right hip fracture due to mechanical fall: S/P  right hip hemiarthroplasty by Dr. Amedeo Kinsman 02/11/23.  Appreciate orthopedic input continue WBAT RLE, aspirin 81 twice daily for DVT prophylaxis, pain medication as needed and follow-up with 2 weeks with orthopedic, PT OT evaluation fand SNF placement   Electrolyte imbalance with hypokalemia/hypomagnesemia/hyperkalemia/hyponatremia:  Electrolytes have normalized, encourage oral intake.   Essential hypertension: BP controlled on Avapro. HLD continue her statin   Pulmonary nodule: 7 mm RUL nodule on CXR>Short interval two-view CXR or CT chest recommended. Will give instruction on discharge.  Given patient's smoking history I strongly advised for her to have follow-up w/ PCP/PULm- will get chest x-ray to  priot ot discharge today   Tobacco abuse: Reportedly smokes a pack a day.Encouraged smoking cessation. Cont Nicotine patch   Anxiety and depression: Stable.Continue home meds/xanax.  Consults: orthopedics Subjective: Alert awake oriented resting comfortably, working with PT OT.  Discharge Exam: Vitals:   02/14/23 0428 02/14/23 0534  BP: (!) 176/87 (!) 156/73  Pulse: 64 68  Resp: 20   Temp: 98.2 F (36.8 C)   SpO2: 96%    General: Pt is alert, awake, not in acute distress Cardiovascular: RRR, S1/S2 +, no rubs, no gallops Respiratory: CTA bilaterally, no wheezing, no rhonchi Abdominal: Soft, NT, ND, bowel sounds + Extremities: no edema, no cyanosis  Discharge Instructions  Discharge Instructions     Discharge instructions   Complete by: As directed    Please call call MD or return to ER for similar or worsening recurring problem that brought you to hospital or if any fever,nausea/vomiting,abdominal pain, uncontrolled pain, chest pain,  shortness of breath or any other alarming symptoms.  Please follow-up your doctor as instructed in a week time and call the office for appointment.  Please avoid alcohol, smoking, or any other illicit substance and maintain healthy habits including taking your regular medications  as prescribed.  You were cared for by a hospitalist during your hospital stay. If you have any questions about your discharge medications or the care you received while you were in the hospital after you are discharged, you can call the unit and ask to speak with the hospitalist on call if the  hospitalist that took care of you is not available.  Once you are discharged, your primary care physician will handle any further medical issues. Please note that NO REFILLS for any discharge medications will be authorized once you are discharged, as it is imperative that you return to your primary care physician (or establish a relationship with a primary care physician if you do not have one) for your aftercare needs so that they can reassess your need for medications and monitor your lab values   Increase activity slowly   Complete by: As directed       Allergies as of 02/14/2023       Reactions   Tetracyclines & Related Itching, Swelling        Medication List     TAKE these medications    ALPRAZolam 0.5 MG tablet Commonly known as: XANAX Take 1 tablet (0.5 mg total) by mouth 2 (two) times daily as needed for up to 2 doses for anxiety.   aspirin EC 81 MG tablet Take 1 tablet (81 mg total) by mouth 2 (two) times daily. Swallow whole. What changed: when to take this   calcium-vitamin D 500-200 MG-UNIT tablet Commonly known as: OSCAL WITH D Take 1 tablet by mouth daily with lunch.   citalopram 40 MG tablet Commonly known as: CELEXA TAKE ONE TABLET ONCE DAILY   cyanocobalamin 1000 MCG/ML injection Commonly known as: VITAMIN B12 INJECT 1 ML INTO MUSCLE EVERY 28 DAYS   ergocalciferol 1.25 MG (50000 UT) capsule Commonly known as: VITAMIN D2 Take 1 capsule (50,000 Units total) by mouth once a week.   ezetimibe 10 MG tablet Commonly known as: ZETIA Take 1 tablet (10 mg total) by mouth daily.   folic acid 1 MG tablet Commonly known as: FOLVITE Take 1 mg by mouth daily.   hydrochlorothiazide 12.5 MG capsule Commonly known as: MICROZIDE Take 1 capsule (12.5 mg total) by mouth daily.   levETIRAcetam 250 MG tablet Commonly known as: KEPPRA Take 250 mg by mouth at bedtime.   olmesartan 40 MG tablet Commonly known as: BENICAR Take 1 tablet (40 mg total) by mouth  daily. What changed: additional instructions   oxyCODONE 5 MG immediate release tablet Commonly known as: Oxy IR/ROXICODONE Take 1 tablet (5 mg total) by mouth every 6 (six) hours as needed for up to 15 doses for moderate pain or severe pain.   pantoprazole 40 MG tablet Commonly known as: PROTONIX Take 1 tablet (40 mg total) by mouth daily.   pravastatin 40 MG tablet Commonly known as: PRAVACHOL Take 1 tablet (40 mg total) by mouth every evening.               Durable Medical Equipment  (From admission, onward)           Start     Ordered   02/12/23 1327  For home use only DME Bedside commode  Once       Question:  Patient needs a bedside commode to treat with the following condition  Answer:  Gait difficulty   02/12/23 1327   02/12/23 1325  For home use only DME Bedside commode  Once  Question:  Patient needs a bedside commode to treat with the following condition  Answer:  Gait difficulty   02/12/23 1327            Follow-up Information     Dettinger, Fransisca Kaufmann, MD Follow up in 1 week(s).   Specialties: Family Medicine, Cardiology Contact information: [place] [address] [phone]         Mordecai Rasmussen, MD Follow up in 1 week(s).   Specialties: Orthopedic Surgery, Sports Medicine Contact information: [place]. [address] [place] [address] [phone]                Allergies  Allergen Reactions   Tetracyclines & Related Itching and Swelling    The results of significant diagnostics from this hospitalization (including imaging, microbiology, ancillary and laboratory) are listed below for reference.    Microbiology: Recent Results (from the past 240 hour(s))  Surgical pcr screen     Status: None   Collection Time: 02/11/23  4:44 AM   Specimen: Nasal Mucosa; Nasal Swab  Result Value Ref Range Status   MRSA, PCR NEGATIVE NEGATIVE Final   Staphylococcus aureus NEGATIVE NEGATIVE Final    Comment: (NOTE) The Xpert  SA Assay (FDA approved for NASAL specimens in patients 37 years of age and older), is one component of a comprehensive surveillance program. It is not intended to diagnose infection nor to guide or monitor treatment. Performed at [facility], [address], [place], [place] [address]     Procedures/Studies: DG HIP UNILAT WITH PELVIS 2-3 VIEWS RIGHT  Result Date: 02/11/2023 CLINICAL DATA:  Closed displaced fracture EXAM: DG HIP (WITH OR WITHOUT PELVIS) 2-3V RIGHT COMPARISON:  None Available. FINDINGS: RIGHT hip arthroplasty for repair of RIGHT femoral neck fracture. No dislocation. Expected soft tissue changes post surgery. IMPRESSION: RIGHT hip arthroplasty without complication Electronically Signed   By: Suzy Bouchard M.D.   On: 02/11/2023 15:59   DG Chest Portable 1 View  Result Date: 02/09/2023 CLINICAL DATA:  72 year old female presenting for evaluation of fall with RIGHT hip fracture, preoperative evaluation. EXAM: PORTABLE CHEST 1 VIEW COMPARISON:  Imaging from November 02, 2022 and July of 2023. FINDINGS: Cardiomediastinal contours and hilar structures are stable with signs of large hiatal hernia. Heart size accentuated by AP projection. Biapical pleural and parenchymal scarring. No signs of consolidation. No pleural effusion. Nodule in the RIGHT upper lobe measuring approximately 7 mm may be new from previous chest radiographs though could be a portion of scarring that is seen in the upper chest. Mild interstitial prominence component of which is chronic and likely accentuated by lower lung volumes than on the prior study. Post cement augmentation of the mid to lower thoracic spine. On limited assessment no acute skeletal process. IMPRESSION: 1. Increased interstitial prominence compared to previous imaging is of uncertain significance. A component of this could be chronic but it is difficult to exclude mild bronchopneumonia or pneumonitis. Correlate with any respiratory symptoms. 2. Nodule  in the RIGHT upper lobe measuring approximately 7 mm may be new from previous chest radiographs though could be a portion of scarring that is seen in the upper chest. Consider short interval PA and lateral chest radiograph when the patient is able. If persistent CT may be warranted. 3. Large hiatal hernia. 4. Biapical pleural and parenchymal scarring. Electronically Signed   By: Zetta Bills M.D.   On: 02/09/2023 18:26   DG Hip Unilat W or Wo Pelvis 2-3 Views Right  Result Date: 02/09/2023 CLINICAL DATA:  Recent trip and fall 2 days ago with right hip pain, initial encounter EXAM: DG HIP (WITH OR WITHOUT PELVIS) 3V RIGHT COMPARISON:  None Available. FINDINGS: Pelvic ring is intact. Subcapital right femoral neck fracture is noted with impaction and angulation at the fracture site. No acute soft tissue abnormality is noted. IMPRESSION: Subcapital right femoral neck fracture. Electronically Signed   By: Inez Catalina M.D.   On: 02/09/2023 17:26    Labs: BNP (last 3 results) No results for input(s): "BNP" in the last 8760 hours. Basic Metabolic Panel: Recent Labs  Lab 02/09/23 1650 02/10/23 0415 02/11/23 0634 02/12/23 0449 02/13/23 0846  NA 132* 136 132* 133* 132*  K 2.6* 4.3 5.3* 4.9 3.6  CL 95* 102 99 100 102  CO2 '24 25 27 28 24  '$ GLUCOSE 112* 84 107* 136* 113*  BUN '14 10 8 9 11  '$ CREATININE 0.79 0.75 0.58 0.56 0.44  CALCIUM 8.8* 8.4* 8.5* 8.3* 8.0*  MG 1.6* 2.0 1.7 1.6*  --   PHOS  --   --  3.0 2.9  --    Liver Function Tests: Recent Labs  Lab 02/11/23 0634 02/12/23 0449  ALBUMIN 2.8* 2.5*   No results for input(s): "LIPASE", "AMYLASE" in the last 168 hours. No results for input(s): "AMMONIA" in the last 168 hours. CBC: Recent Labs  Lab 02/09/23 1650 02/10/23 0415 02/11/23 0634 02/12/23 0449  WBC 8.1 6.5 6.7 7.4  NEUTROABS 6.1  --   --   --   HGB 15.1* 14.1 13.6 12.0  HCT 42.5 40.8 40.1 35.4*  MCV 99.8 104.3* 105.2* 105.0*  PLT 187 158 178 190   Cardiac Enzymes: No  results for input(s): "CKTOTAL", "CKMB", "CKMBINDEX", "TROPONINI" in the last 168 hours. BNP: Invalid input(s): "POCBNP" CBG: No results for input(s): "GLUCAP" in the last 168 hours. D-Dimer No results for input(s): "DDIMER" in the last 72 hours. Hgb A1c No results for input(s): "HGBA1C" in the last 72 hours. Lipid Profile No results for input(s): "CHOL", "HDL", "LDLCALC", "TRIG", "CHOLHDL", "LDLDIRECT" in the last 72 hours. Thyroid function studies No results for input(s): "TSH", "T4TOTAL", "T3FREE", "THYROIDAB" in the last 72 hours.  Invalid input(s): "FREET3" Anemia work up No results for input(s): "VITAMINB12", "FOLATE", "FERRITIN", "TIBC", "IRON", "RETICCTPCT" in the last 72 hours. Urinalysis    Component Value Date/Time   COLORURINE YELLOW 07/11/2020 2212   APPEARANCEUR Clear 01/22/2022 1411   LABSPEC 1.008 07/11/2020 2212   PHURINE 6.0 07/11/2020 2212   GLUCOSEU Negative 01/22/2022 1411   HGBUR NEGATIVE 07/11/2020 2212   BILIRUBINUR Negative 01/22/2022 1411   KETONESUR NEGATIVE 07/11/2020 2212   PROTEINUR Negative 01/22/2022 1411   PROTEINUR NEGATIVE 07/11/2020 2212   UROBILINOGEN negative 04/11/2014 1713   NITRITE Negative 01/22/2022 1411   NITRITE NEGATIVE 07/11/2020 2212   LEUKOCYTESUR Negative 01/22/2022 1411   LEUKOCYTESUR NEGATIVE 07/11/2020 2212   Sepsis Labs Recent Labs  Lab 02/09/23 1650 02/10/23 0415 02/11/23 0634 02/12/23 0449  WBC 8.1 6.5 6.7 7.4   Microbiology Recent Results (from the past 240 hour(s))  Surgical pcr screen     Status: None   Collection Time: 02/11/23  4:44 AM   Specimen: Nasal Mucosa; Nasal Swab  Result Value Ref Range Status   MRSA, PCR NEGATIVE NEGATIVE Final   Staphylococcus aureus NEGATIVE NEGATIVE Final    Comment: (NOTE) The Xpert SA Assay (FDA approved for NASAL specimens in patients 49 years of age and older), is one component of a comprehensive  surveillance program. It is not intended to diagnose infection nor  to guide or monitor treatment. Performed at [facility], [address], [place], [place] [address]      Time coordinating discharge: 25 minutes  SIGNED: Antonieta Pert, MD  [facility] 02/14/2023, 10:40 AM  If 7PM-7AM, please contact night-coverage [email or web address]

## 2023-02-13 NOTE — Progress Notes (Signed)
PROGRESS NOTE Jody Wilson  [id number] DOB: 08/05/1951 DOA: 02/09/2023 PCP: Dettinger, Fransisca Kaufmann, MD   Brief Narrative/Hospital Course: 72 year old F with PMH of HTN and tobacco use disorder presenting with right hip pain after accidental fall at home.  Patient reports tripping over her flip-flop and fall on the right side 2 days prior.  Denies striking her head or LOC. In ED, elevated BP to 196/113.  K2.6.  Mg 1.6.  Pelvic x-ray with subcapital right femoral neck fracture.  Orthopedic surgery consulted and admitted. Underwent right hip hemiarthroplasty by Dr. Amedeo Kinsman 02/11/23. Postop doing well seen by PT OT recommending skilled nursing facility    Subjective: Seen and examined.  He is resting comfortably has no complaints pain is fairly controlled   Assessment and Plan: Principal Problem:   Closed right hip fracture (HCC) Active Problems:   Electrolyte abnormality   Hypertension   Tobacco abuse   Anxiety and depression   Pulmonary nodule  Close right hip fracture due to mechanical fall: S/P  right hip hemiarthroplasty by Dr. Amedeo Kinsman 02/11/23.  Appreciate orthopedic input continue WBAT RLE, aspirin 81 twice daily for DVT prophylaxis, pain medication as needed and follow-up with 2 weeks with orthopedic, PT OT evaluation for disposition.  Electrolyte imbalance with hypokalemia/hypomagnesemia/hyperkalemia/hyponatremia: Sodium/potassium magnesium has improved  Essential hypertension: BP well controlled on Avapro. HLD continue her statin Pulmonary nodule: 7 mm RUL nodule on CXR>Short interval two-view CXR or CT chest recommended. Will give instruction on discharge.   Tobacco abuse: Reportedly smokes a pack a day.Encouraged smoking cessation. Cont Nicotine patch   Anxiety and depression: Stable. -Continue home meds.  DVT prophylaxis: heparin injection 5,000 Units Start: 02/09/23 2230 Code Status:   Code Status: Full Code Family Communication: plan of care discussed with patien at  bedside. Patient status is: Inpatient because of right hip surgery Level of care: Telemetry   Dispo: The patient is from: home            Anticipated disposition: Skilled nursing facility, she is medically stable.  Awaiting for placement.    Objective: Vitals last 24 hrs: Vitals:   02/11/23 2138 02/12/23 0445 02/12/23 1938 02/13/23 0415  BP: 115/65 (!) 119/58 (!) 112/90 (!) 160/79  Pulse: 85 66 84 85  Resp: 19 18 (!) 23 19  Temp: (!) 97.5 F (36.4 C) 98.4 F (36.9 C) 98.3 F (36.8 C) 98 F (36.7 C)  TempSrc: Oral Oral Oral Oral  SpO2: 94% 96% 96% 93%  Weight:      Height:       Weight change:   Physical Examination: General exam: AAox3, weak,older appearing HEENT:Oral mucosa moist, Ear/Nose WNL grossly, dentition normal. Respiratory system: bilaterally clear BS, no use of accessory muscle Cardiovascular system: S1 & S2 +, regular rate. Gastrointestinal system: Abdomen soft, NT,ND,BS+ Nervous System:Alert, awake, moving extremities and grossly nonfocal Extremities: LE ankle edema neg. Rt hip surgical incision with clean dressing in place Skin: No rashes,no icterus. MSK: Normal muscle bulk,tone, power   Medications reviewed:  Scheduled Meds:  citalopram  40 mg Oral Daily   heparin  5,000 Units Subcutaneous Q8H   irbesartan  150 mg Oral BID   pantoprazole  40 mg Oral Daily   pravastatin  40 mg Oral QPM   Continuous Infusions:  sodium chloride 75 mL/hr at 02/13/23 0041      Diet Order             Diet regular Room service appropriate? Yes; Fluid consistency: Thin  Diet effective now  Intake/Output Summary (Last 24 hours) at 02/13/2023 1005 Last data filed at 02/13/2023 0041 Gross per 24 hour  Intake 1558.31 ml  Output 150 ml  Net 1408.31 ml    Net IO Since Admission: 1,625.83 mL [02/13/23 1005]  Wt Readings from Last 3 Encounters:  02/11/23 66.2 kg  12/30/22 65.8 kg  12/24/22 66.2 kg     Unresulted Labs (From admission, onward)     None     Data Reviewed: I have personally reviewed following labs and imaging studies CBC: Recent Labs  Lab 02/09/23 1650 02/10/23 0415 02/11/23 0634 02/12/23 0449  WBC 8.1 6.5 6.7 7.4  NEUTROABS 6.1  --   --   --   HGB 15.1* 14.1 13.6 12.0  HCT 42.5 40.8 40.1 35.4*  MCV 99.8 104.3* 105.2* 105.0*  PLT 187 158 178 [id number]    Basic Metabolic Panel: Recent Labs  Lab 02/09/23 1650 02/10/23 0415 02/11/23 0634 02/12/23 0449 02/13/23 0846  NA 132* 136 132* 133* 132*  K 2.6* 4.3 5.3* 4.9 3.6  CL 95* 102 99 100 102  CO2 '24 25 27 28 24  '$ GLUCOSE 112* 84 107* 136* 113*  BUN '14 10 8 9 11  '$ CREATININE 0.79 0.75 0.58 0.56 0.44  CALCIUM 8.8* 8.4* 8.5* 8.3* 8.0*  MG 1.6* 2.0 1.7 1.6*  --   PHOS  --   --  3.0 2.9  --     GFR: Estimated Creatinine Clearance: 58 mL/min (by C-G formula based on SCr of 0.44 mg/dL). Liver Function Tests: Recent Labs  Lab 02/11/23 0634 02/12/23 0449  ALBUMIN 2.8* 2.5*    Recent Labs  Lab 02/09/23 1650  INR 2.4*   No results for input(s): "PROCALCITON", "LATICACIDVEN" in the last 168 hours.  Recent Results (from the past 240 hour(s))  Surgical pcr screen     Status: None   Collection Time: 02/11/23  4:44 AM   Specimen: Nasal Mucosa; Nasal Swab  Result Value Ref Range Status   MRSA, PCR NEGATIVE NEGATIVE Final   Staphylococcus aureus NEGATIVE NEGATIVE Final    Comment: (NOTE) The Xpert SA Assay (FDA approved for NASAL specimens in patients 69 years of age and older), is one component of a comprehensive surveillance program. It is not intended to diagnose infection nor to guide or monitor treatment. Performed at [facility], [address] [place], [place] [address]     Antimicrobials: Anti-infectives (From admission, onward)    Start     Dose/Rate Route Frequency Ordered Stop   02/11/23 1800  ceFAZolin (ANCEF) IVPB 2g/100 mL premix        2 g 200 mL/hr over 30 Minutes Intravenous Every 8 hours 02/11/23 1637 02/12/23 1436    02/11/23 1429  vancomycin (VANCOCIN) powder  Status:  Discontinued          As needed 02/11/23 1429 02/11/23 1644   02/11/23 1213  ceFAZolin (ANCEF) 2-4 GM/100ML-% IVPB       Note to Pharmacy: Abbie Sons S: cabinet override      02/11/23 1213 02/11/23 1804   02/11/23 1200  ceFAZolin (ANCEF) IVPB 2g/100 mL premix        2 g 200 mL/hr over 30 Minutes Intravenous On call to O.R. 02/10/23 1714 02/11/23 1252      Culture/Microbiology No results found for: "SDES", "SPECREQUEST", "CULT", "REPTSTATUS"  Other culture-see note  Radiology Studies: DG HIP UNILAT WITH PELVIS 2-3 VIEWS RIGHT  Result Date: 02/11/2023 CLINICAL DATA:  Closed displaced fracture EXAM: DG HIP (WITH  OR WITHOUT PELVIS) 2-3V RIGHT COMPARISON:  None Available. FINDINGS: RIGHT hip arthroplasty for repair of RIGHT femoral neck fracture. No dislocation. Expected soft tissue changes post surgery. IMPRESSION: RIGHT hip arthroplasty without complication Electronically Signed   By: Suzy Bouchard M.D.   On: 02/11/2023 15:59     LOS: 4 days   Antonieta Pert, MD [facility]  02/13/2023, 10:05 AM

## 2023-02-14 ENCOUNTER — Inpatient Hospital Stay (HOSPITAL_COMMUNITY): Payer: Medicare Other

## 2023-02-14 DIAGNOSIS — K449 Diaphragmatic hernia without obstruction or gangrene: Secondary | ICD-10-CM | POA: Diagnosis not present

## 2023-02-14 DIAGNOSIS — S72011A Unspecified intracapsular fracture of right femur, initial encounter for closed fracture: Secondary | ICD-10-CM | POA: Diagnosis not present

## 2023-02-14 DIAGNOSIS — K219 Gastro-esophageal reflux disease without esophagitis: Secondary | ICD-10-CM | POA: Diagnosis not present

## 2023-02-14 DIAGNOSIS — E785 Hyperlipidemia, unspecified: Secondary | ICD-10-CM | POA: Diagnosis not present

## 2023-02-14 DIAGNOSIS — Z79899 Other long term (current) drug therapy: Secondary | ICD-10-CM | POA: Diagnosis not present

## 2023-02-14 DIAGNOSIS — D519 Vitamin B12 deficiency anemia, unspecified: Secondary | ICD-10-CM | POA: Diagnosis not present

## 2023-02-14 DIAGNOSIS — I10 Essential (primary) hypertension: Secondary | ICD-10-CM | POA: Diagnosis not present

## 2023-02-14 DIAGNOSIS — R0989 Other specified symptoms and signs involving the circulatory and respiratory systems: Secondary | ICD-10-CM | POA: Diagnosis not present

## 2023-02-14 DIAGNOSIS — Z96649 Presence of unspecified artificial hip joint: Secondary | ICD-10-CM | POA: Diagnosis not present

## 2023-02-14 DIAGNOSIS — E559 Vitamin D deficiency, unspecified: Secondary | ICD-10-CM | POA: Diagnosis not present

## 2023-02-14 DIAGNOSIS — R911 Solitary pulmonary nodule: Secondary | ICD-10-CM | POA: Diagnosis not present

## 2023-02-14 DIAGNOSIS — Z7189 Other specified counseling: Secondary | ICD-10-CM | POA: Diagnosis not present

## 2023-02-14 DIAGNOSIS — R52 Pain, unspecified: Secondary | ICD-10-CM | POA: Diagnosis not present

## 2023-02-14 DIAGNOSIS — Z96641 Presence of right artificial hip joint: Secondary | ICD-10-CM | POA: Diagnosis not present

## 2023-02-14 DIAGNOSIS — S72001A Fracture of unspecified part of neck of right femur, initial encounter for closed fracture: Secondary | ICD-10-CM | POA: Diagnosis not present

## 2023-02-14 DIAGNOSIS — S72001D Fracture of unspecified part of neck of right femur, subsequent encounter for closed fracture with routine healing: Secondary | ICD-10-CM | POA: Diagnosis not present

## 2023-02-14 DIAGNOSIS — E878 Other disorders of electrolyte and fluid balance, not elsewhere classified: Secondary | ICD-10-CM | POA: Diagnosis not present

## 2023-02-14 DIAGNOSIS — J90 Pleural effusion, not elsewhere classified: Secondary | ICD-10-CM | POA: Diagnosis not present

## 2023-02-14 DIAGNOSIS — J9811 Atelectasis: Secondary | ICD-10-CM | POA: Diagnosis not present

## 2023-02-14 DIAGNOSIS — B3731 Acute candidiasis of vulva and vagina: Secondary | ICD-10-CM | POA: Diagnosis not present

## 2023-02-14 DIAGNOSIS — Z720 Tobacco use: Secondary | ICD-10-CM | POA: Diagnosis not present

## 2023-02-14 DIAGNOSIS — F32A Depression, unspecified: Secondary | ICD-10-CM | POA: Diagnosis not present

## 2023-02-14 NOTE — Progress Notes (Signed)
Mobility Specialist Progress Note:   02/14/23 1144  Mobility  Activity Ambulated with assistance in hallway  Level of Assistance Contact guard assist, steadying assist  Assistive Device Front wheel walker  Distance Ambulated (ft) 30 ft  RLE Weight Bearing WBAT  Activity Response Tolerated well  Mobility Referral Yes  $Mobility charge 1 Mobility   Pt received in chair, agreeable for mobility session, tolerated well. C/o right hip pain and muscle pain in LE. Denies dizziness, SOB, or chest pain. HR during ambulation ranged from 106 bpm -111 bpm (max). Returned pt to chair, HR recovered to 99bpm. All needs met, call bell in reach.   Royetta Crochet Mobility Specialist Please contact via Solicitor or  Rehab office at [phone]

## 2023-02-14 NOTE — Progress Notes (Signed)
Called report to nurse April at [facility]. Pt is resting in room comfortably awaiting transport. Will continue to monitor.

## 2023-02-14 NOTE — Progress Notes (Signed)
Physical Therapy Treatment Patient Details Name: Jody Wilson MRN: [id number] DOB: 08-09-1951 Today's Date: 02/14/2023   History of Present Illness Jody Wilson is a 72 y.o. female, s/p Right hip hemiarthroplasty on 02/11/23 with medical history significant for hypertension.  Patient presented to ED with complaints of a fall and right hip pain.  She reports she tripped over a flip-flop 2 days ago falling on her right hip.  She did not hit her head.  She did not lose consciousness.  Since the fall she has not been able to bear weight on her right leg.    PT Comments    Pt tolerated today's treatment session well with focus on continue education on precautions, HEP and continued functional activity practice. Today's session addressed functional tranfers from various surfaces with use of RW and RLE positioning to ease burden of pain. Pt noted with improved strength and less assistance throughout multiple sit/stand trials, increased overall ambulation distance to 45 ft with improved cadence and stepping pattern. Pt would continue to benefit from skilled acute physical therapy services in order to progress toward POC goals, safety/independence with functional mobility and QOL.    Recommendations for follow up therapy are one component of a multi-disciplinary discharge planning process, led by the attending physician.  Recommendations may be updated based on patient status, additional functional criteria and insurance authorization.  Follow Up Recommendations  Skilled nursing-short term rehab (<3 hours/day) Can patient physically be transported by private vehicle: Yes   Assistance Recommended at Discharge Set up Supervision/Assistance  Patient can return home with the following A lot of help with walking and/or transfers;A little help with bathing/dressing/bathroom;Help with stairs or ramp for entrance;Assistance with cooking/housework   Equipment Recommendations  BSC/3in1    Recommendations  for Other Services       Precautions / Restrictions Precautions Precautions: Fall;Posterior Hip Precaution Booklet Issued: No Restrictions Weight Bearing Restrictions: No RLE Weight Bearing: Weight bearing as tolerated Other Position/Activity Restrictions: WBAT on RLE.     Mobility  Bed Mobility Overal bed mobility: Needs Assistance Bed Mobility: Supine to Sit     Supine to sit: Min assist     General bed mobility comments: increased time, labored movement, required Min assist for moving RLE due to pain Patient Response: Cooperative  Transfers Overall transfer level: Needs assistance Equipment used: Rolling walker (2 wheels) Transfers: Sit to/from Stand, Bed to chair/wheelchair/BSC Sit to Stand: Min assist, Min guard   Step pivot transfers: Min assist       General transfer comment: Mutliple sit/stands from EOB, BSC, and controlled descent to recliner post mobility. Min assist initially from EOB with cuing for hand placement, controlled descent to [facility] with min assist and cuing for hand placement and RLE placement to ease WB tolerance. min guard for sit<.stand from [facility] with coninued cuing. min guard for controlled descent to recliner with continuing cuing for hand and RLE placement.    Ambulation/Gait Ambulation/Gait assistance: Min assist Gait Distance (Feet): 45 Feet Assistive device: Rolling walker (2 wheels) Gait Pattern/deviations: Decreased step length - right, Decreased step length - left, Decreased stride length, Antalgic Gait velocity: decreased     General Gait Details: slow labored cadence with limited heel to toe stepping on right foot due to increased hip pain. Cues for redcued step length and improved tolerance and cadence with continued practice.   Stairs             Wheelchair Mobility    Modified Rankin (Stroke Patients Only)  Balance Overall balance assessment: Needs assistance Sitting-balance support: Feet supported, No upper  extremity supported Sitting balance-Leahy Scale: Fair Sitting balance - Comments: fair/good seated at EOB   Standing balance support: During functional activity, Bilateral upper extremity supported Standing balance-Leahy Scale: Fair Standing balance comment: using RW; poor standing postural reactions.                            Cognition Arousal/Alertness: Awake/alert Behavior During Therapy: WFL for tasks assessed/performed Overall Cognitive Status: Within Functional Limits for tasks assessed                                          Exercises General Exercises - Lower Extremity Long Arc Quad: AROM, 20 reps, Both (Educated to do 30 every hour.) Heel Slides: AROM, Right, Left, Both, 10 reps    General Comments        Pertinent Vitals/Pain Pain Assessment Pain Assessment: Faces Faces Pain Scale: Hurts a little bit Pain Location: right hip Pain Descriptors / Indicators: Sore, Guarding, Grimacing Pain Intervention(s): Premedicated before session    Home Living                          Prior Function            PT Goals (current goals can now be found in the care plan section) Acute Rehab PT Goals Patient Stated Goal: return home with family to assist PT Goal Formulation: With patient/family Time For Goal Achievement: 02/26/23 Potential to Achieve Goals: Good Progress towards PT goals: Progressing toward goals    Frequency    Min 4X/week      PT Plan Current plan remains appropriate    Co-evaluation              AM-PAC PT "6 Clicks" Mobility   Outcome Measure  Help needed turning from your back to your side while in a flat bed without using bedrails?: A Little Help needed moving from lying on your back to sitting on the side of a flat bed without using bedrails?: A Little Help needed moving to and from a bed to a chair (including a wheelchair)?: A Little Help needed standing up from a chair using your arms (e.g.,  wheelchair or bedside chair)?: A Little Help needed to walk in hospital room?: A Little Help needed climbing 3-5 steps with a railing? : Total 6 Click Score: 16    End of Session Equipment Utilized During Treatment: Gait belt Activity Tolerance: Patient tolerated treatment well;Patient limited by fatigue Patient left: in chair;with call bell/phone within reach Nurse Communication: Mobility status PT Visit Diagnosis: Unsteadiness on feet (R26.81);Other abnormalities of gait and mobility (R26.89);Muscle weakness (generalized) (M62.81)     Time: [id number] PT Time Calculation (min) (ACUTE ONLY): 29 min  Charges:  $Therapeutic Activity: 23-37 mins                     Wonda Olds PT, DPT Physical Therapist with [place] [phone] office   Wonda Olds 02/14/2023, 10:13 AM

## 2023-02-14 NOTE — Progress Notes (Signed)
Patient rested well during this shift. She c/o pain of right hip x2 during this shift. PRN pain medications given as ordered x2. Patient BP at 0428 is 176/87 MAP 110 HR 64. PRN hydralazine given and rechecked BP at 0534 and BP is now 156/73.

## 2023-02-14 NOTE — Progress Notes (Signed)
Attempted to call report to [facility] x4 with no success. Will continue to touch base to give report.

## 2023-02-14 NOTE — Progress Notes (Addendum)
Mobility Specialist Progress Note:    02/14/23 1342  Mobility  Activity Ambulated with assistance to bathroom  Level of Assistance Standby assist, set-up cues, supervision of patient - no hands on  Assistive Device Front wheel walker  Distance Ambulated (ft) 15 ft  Activity Response Tolerated well  Mobility Referral Yes  $Mobility charge 1 Mobility   Pt requested assistance to bathroom. Tolerated well, max HR 106 bpm, recovered to 86 bpm. C/o leg pain, nurse notified. Returned pt to chair with all needs met, call bell in reach.   Royetta Crochet Mobility Specialist Please contact via Solicitor or  Rehab office at [phone]

## 2023-02-14 NOTE — Care Management Important Message (Signed)
Important Message  Patient Details  Name: Jody Wilson MRN: [id number] Date of Birth: 1951/06/30   Medicare Important Message Given:  Yes     Tommy Medal 02/14/2023, 11:35 AM

## 2023-02-14 NOTE — TOC Transition Note (Signed)
Transition of Care [facility]) - CM/SW Discharge Note   Patient Details  Name: Jody Wilson MRN: [id number] Date of Birth: 04-24-1951  Transition of Care [facility]) CM/SW Contact:  Ihor Gully, LCSW Phone Number: 02/14/2023, 3:26 PM   Clinical Narrative:    D/c clinicals sent to facility. RN to call report. TOC signing off.    Final next level of care: Skilled Nursing Facility Barriers to Discharge: No Barriers Identified   Patient Goals and CMS Choice CMS Medicare.gov Compare Post Acute Care list provided to:: Patient Choice offered to / list presented to : Patient  Discharge Placement                Patient chooses bed at: [facility] Patient to be transferred to facility by: [facility] Name of family member notified: son, eric Patient and family notified of of transfer: 02/14/23  Discharge Plan and Services Additional resources added to the After Visit Summary for                                       Social Determinants of Health (SDOH) Interventions SDOH Screenings   Food Insecurity: No Food Insecurity (02/10/2023)  Housing: Low Risk  (02/10/2023)  Transportation Needs: No Transportation Needs (02/10/2023)  Utilities: Not At Risk (02/10/2023)  Alcohol Screen: Low Risk  (12/24/2022)  Depression (PHQ2-9): Medium Risk (12/30/2022)  Financial Resource Strain: Low Risk  (12/24/2022)  Physical Activity: Inactive (12/24/2022)  Social Connections: Socially Integrated (12/24/2022)  Stress: No Stress Concern Present (12/24/2022)  Tobacco Use: Medium Risk (02/11/2023)     Readmission Risk Interventions     No data to display

## 2023-02-16 DIAGNOSIS — S72001A Fracture of unspecified part of neck of right femur, initial encounter for closed fracture: Secondary | ICD-10-CM | POA: Diagnosis not present

## 2023-02-16 DIAGNOSIS — I10 Essential (primary) hypertension: Secondary | ICD-10-CM | POA: Diagnosis not present

## 2023-02-16 DIAGNOSIS — R911 Solitary pulmonary nodule: Secondary | ICD-10-CM | POA: Diagnosis not present

## 2023-02-16 DIAGNOSIS — Z720 Tobacco use: Secondary | ICD-10-CM | POA: Diagnosis not present

## 2023-02-16 DIAGNOSIS — K449 Diaphragmatic hernia without obstruction or gangrene: Secondary | ICD-10-CM | POA: Diagnosis not present

## 2023-02-16 DIAGNOSIS — K219 Gastro-esophageal reflux disease without esophagitis: Secondary | ICD-10-CM | POA: Diagnosis not present

## 2023-02-16 DIAGNOSIS — E785 Hyperlipidemia, unspecified: Secondary | ICD-10-CM | POA: Diagnosis not present

## 2023-02-17 DIAGNOSIS — E878 Other disorders of electrolyte and fluid balance, not elsewhere classified: Secondary | ICD-10-CM | POA: Diagnosis not present

## 2023-02-17 DIAGNOSIS — R52 Pain, unspecified: Secondary | ICD-10-CM | POA: Diagnosis not present

## 2023-02-17 DIAGNOSIS — R911 Solitary pulmonary nodule: Secondary | ICD-10-CM | POA: Diagnosis not present

## 2023-02-17 DIAGNOSIS — Z79899 Other long term (current) drug therapy: Secondary | ICD-10-CM | POA: Diagnosis not present

## 2023-02-17 DIAGNOSIS — R0989 Other specified symptoms and signs involving the circulatory and respiratory systems: Secondary | ICD-10-CM | POA: Diagnosis not present

## 2023-02-17 DIAGNOSIS — S72011A Unspecified intracapsular fracture of right femur, initial encounter for closed fracture: Secondary | ICD-10-CM | POA: Diagnosis not present

## 2023-02-21 ENCOUNTER — Encounter (HOSPITAL_COMMUNITY): Payer: Self-pay | Admitting: Orthopedic Surgery

## 2023-02-21 DIAGNOSIS — S72011A Unspecified intracapsular fracture of right femur, initial encounter for closed fracture: Secondary | ICD-10-CM | POA: Diagnosis not present

## 2023-02-21 DIAGNOSIS — I10 Essential (primary) hypertension: Secondary | ICD-10-CM | POA: Diagnosis not present

## 2023-02-21 DIAGNOSIS — Z7189 Other specified counseling: Secondary | ICD-10-CM | POA: Diagnosis not present

## 2023-02-21 DIAGNOSIS — R52 Pain, unspecified: Secondary | ICD-10-CM | POA: Diagnosis not present

## 2023-02-21 DIAGNOSIS — B3731 Acute candidiasis of vulva and vagina: Secondary | ICD-10-CM | POA: Diagnosis not present

## 2023-02-21 DIAGNOSIS — K219 Gastro-esophageal reflux disease without esophagitis: Secondary | ICD-10-CM | POA: Diagnosis not present

## 2023-02-21 DIAGNOSIS — Z79899 Other long term (current) drug therapy: Secondary | ICD-10-CM | POA: Diagnosis not present

## 2023-02-24 DIAGNOSIS — I10 Essential (primary) hypertension: Secondary | ICD-10-CM | POA: Diagnosis not present

## 2023-02-24 DIAGNOSIS — Z720 Tobacco use: Secondary | ICD-10-CM | POA: Diagnosis not present

## 2023-02-24 DIAGNOSIS — S72001A Fracture of unspecified part of neck of right femur, initial encounter for closed fracture: Secondary | ICD-10-CM | POA: Diagnosis not present

## 2023-02-24 DIAGNOSIS — R52 Pain, unspecified: Secondary | ICD-10-CM | POA: Diagnosis not present

## 2023-02-24 DIAGNOSIS — K449 Diaphragmatic hernia without obstruction or gangrene: Secondary | ICD-10-CM | POA: Diagnosis not present

## 2023-02-24 DIAGNOSIS — K219 Gastro-esophageal reflux disease without esophagitis: Secondary | ICD-10-CM | POA: Diagnosis not present

## 2023-02-24 DIAGNOSIS — E785 Hyperlipidemia, unspecified: Secondary | ICD-10-CM | POA: Diagnosis not present

## 2023-02-24 DIAGNOSIS — S72011A Unspecified intracapsular fracture of right femur, initial encounter for closed fracture: Secondary | ICD-10-CM | POA: Diagnosis not present

## 2023-02-24 DIAGNOSIS — E878 Other disorders of electrolyte and fluid balance, not elsewhere classified: Secondary | ICD-10-CM | POA: Diagnosis not present

## 2023-02-24 DIAGNOSIS — R911 Solitary pulmonary nodule: Secondary | ICD-10-CM | POA: Diagnosis not present

## 2023-02-25 ENCOUNTER — Encounter: Payer: Self-pay | Admitting: Orthopedic Surgery

## 2023-02-25 ENCOUNTER — Ambulatory Visit (INDEPENDENT_AMBULATORY_CARE_PROVIDER_SITE_OTHER): Payer: Medicare Other

## 2023-02-25 ENCOUNTER — Ambulatory Visit (INDEPENDENT_AMBULATORY_CARE_PROVIDER_SITE_OTHER): Payer: Medicare Other | Admitting: Orthopedic Surgery

## 2023-02-25 VITALS — Ht 65.0 in | Wt 143.0 lb

## 2023-02-25 DIAGNOSIS — Z96649 Presence of unspecified artificial hip joint: Secondary | ICD-10-CM

## 2023-02-25 NOTE — Progress Notes (Signed)
Orthopaedic Postop Note  Assessment: Jody Wilson is a 72 y.o. female s/p Right hip hemiarthroplasty  DOS: 02/11/2023  Plan: Sutures trimmed, steri strips placed Continue using hip abduction pillow while in bed until 6 weeks after surgery Continue posterior hip precautions until 3 months postop Continue with DVT prophylaxis for at least 6 weeks after surgery WBAT on the operative extremity Follow up in 4 weeks; call with any issues     Follow-up: Return in about 4 weeks (around 03/25/2023). XR at next visit: AP pelvis and Right hip  Subjective:  Chief Complaint  Patient presents with   Routine Post Op    Rt hip DOS 02/11/23    History of Present Illness: Jody Wilson is a 72 y.o. female who presents following the above stated procedure.  Surgery was 2 weeks ago.  She has done well.  She is in a nursing home, but will be discharged tomorrow.  She states that therapy has provided her with reassurance, telling her she is doing very well.  She is ambulating well with a walker.  She is taking some pain medication daily, but this is limited.  She has noted some swelling distally, but this improves when she gets off her feet.  Review of Systems: No fevers or chills No numbness or tingling No Chest Pain No shortness of breath   Objective: Ht '5\' 5"'$  (1.651 m)   Wt 143 lb (64.9 kg)   BMI 23.80 kg/m   Physical Exam:  Alert and oriented.  No acute distress.  Slow steady gait with use of a walker.  Lateral based hip incision is healing well.  No surrounding erythema or drainage.  No tenderness to palpation.  She is able to maintain a straight leg raise.  She tolerates axial loading.  She tolerates gentle range of motion of her hip.  Sensation intact over the dorsum of the right foot.  IMAGING: I personally ordered and reviewed the following images:  XR of the Right hip and AP pelvis demonstrates a hip arthroplasty in good position.  The hip remains reduced.  There is no  evidence of implant subsidence.  No acute fractures are noted.  Impression: Right hip hemiarthroplasty in stable position, without evidence of migration or subsidence compared to prior XR   Mordecai Rasmussen, MD 02/25/2023 10:07 PM

## 2023-02-28 ENCOUNTER — Ambulatory Visit: Payer: Medicare Other | Admitting: Family Medicine

## 2023-02-28 DIAGNOSIS — R52 Pain, unspecified: Secondary | ICD-10-CM | POA: Diagnosis not present

## 2023-02-28 DIAGNOSIS — E871 Hypo-osmolality and hyponatremia: Secondary | ICD-10-CM | POA: Diagnosis not present

## 2023-02-28 DIAGNOSIS — S72001D Fracture of unspecified part of neck of right femur, subsequent encounter for closed fracture with routine healing: Secondary | ICD-10-CM | POA: Diagnosis not present

## 2023-03-01 ENCOUNTER — Encounter: Payer: Self-pay | Admitting: Family Medicine

## 2023-03-01 ENCOUNTER — Ambulatory Visit (INDEPENDENT_AMBULATORY_CARE_PROVIDER_SITE_OTHER): Payer: Medicare Other | Admitting: Family Medicine

## 2023-03-01 VITALS — BP 99/60 | HR 80 | Temp 98.7°F | Ht 65.0 in | Wt 146.0 lb

## 2023-03-01 DIAGNOSIS — F32A Depression, unspecified: Secondary | ICD-10-CM | POA: Diagnosis not present

## 2023-03-01 DIAGNOSIS — E785 Hyperlipidemia, unspecified: Secondary | ICD-10-CM | POA: Diagnosis not present

## 2023-03-01 DIAGNOSIS — I10 Essential (primary) hypertension: Secondary | ICD-10-CM

## 2023-03-01 DIAGNOSIS — F419 Anxiety disorder, unspecified: Secondary | ICD-10-CM | POA: Diagnosis not present

## 2023-03-01 DIAGNOSIS — Z9889 Other specified postprocedural states: Secondary | ICD-10-CM | POA: Diagnosis not present

## 2023-03-01 MED ORDER — PANTOPRAZOLE SODIUM 40 MG PO TBEC: 40.0000 mg | DELAYED_RELEASE_TABLET | Freq: Every day | ORAL | 3 refills | Status: DC

## 2023-03-01 MED ORDER — PRAVASTATIN SODIUM 40 MG PO TABS: 40.0000 mg | ORAL_TABLET | Freq: Every evening | ORAL | 3 refills | Status: DC

## 2023-03-01 MED ORDER — CITALOPRAM HYDROBROMIDE 40 MG PO TABS: 40.0000 mg | ORAL_TABLET | Freq: Every day | ORAL | 1 refills | Status: DC

## 2023-03-01 MED ORDER — OLMESARTAN MEDOXOMIL 40 MG PO TABS: 40.0000 mg | ORAL_TABLET | Freq: Every day | ORAL | 3 refills | Status: DC

## 2023-03-01 MED ORDER — HYDROCHLOROTHIAZIDE 12.5 MG PO CAPS: 12.5000 mg | ORAL_CAPSULE | Freq: Every day | ORAL | 3 refills | Status: DC

## 2023-03-01 NOTE — Patient Instructions (Signed)
Go back to JUST 1/2 tablet of the HCTZ or 1 of the 12.'5mg'$  capsules.

## 2023-03-01 NOTE — Progress Notes (Signed)
Subjective: CC: Discharge follow-up PCP: Dettinger, Fransisca Kaufmann, MD [id number] Jody Wilson is a 72 y.o. female presenting to clinic today for:  Patient here for discharge follow-up from [facility].  She had a short stent in the rehabilitation facility after having a right-sided hip surgery.  She seems to be recovering well from the hip surgery and has already seen her orthopedist outpatient.  Next visit will be in a month.  She does note that they advanced her hydrochlorothiazide to 25 mg while she was in the facility due to blood pressures that were in the 180s over 100s.  She admits that she was in quite a bit of pain during those measurements as her pain medication had lapsed because they "did not receive the orders from the hospital" until the following day.  She does of course of voiced frustration over having been allowed to suffer during that time.  She is off of pain medications now and seems to be ambulating with the use of her walker.  Home health physical therapy is supposed to be coming soon and is already been ordered by the nursing facility.  She does need refills on her medications that she will not see her PCP for another month.  No dizziness, chest pain, shortness of breath reported.  No oozing from hip surgery site.   ROS: Per HPI  Allergies  Allergen Reactions   Tetracyclines & Related Itching and Swelling   Past Medical History:  Diagnosis Date   Anemia    Anxiety    Aortic atherosclerosis (HCC)    Depression    GERD (gastroesophageal reflux disease)    Hyperlipidemia    Hypertension    Orthostatic hypotension    Osteoporosis     Current Outpatient Medications:    ALPRAZolam (XANAX) 0.5 MG tablet, Take 1 tablet (0.5 mg total) by mouth 2 (two) times daily as needed for up to 2 doses for anxiety., Disp: 2 tablet, Rfl: 0   aspirin EC 81 MG tablet, Take 1 tablet (81 mg total) by mouth 2 (two) times daily. Swallow whole., Disp: 30 tablet, Rfl: 0   calcium-vitamin D (OSCAL  WITH D) 500-200 MG-UNIT tablet, Take 1 tablet by mouth daily with lunch. , Disp: , Rfl:    citalopram (CELEXA) 40 MG tablet, TAKE ONE TABLET ONCE DAILY, Disp: 90 tablet, Rfl: 0   cyanocobalamin (VITAMIN B12) 1000 MCG/ML injection, INJECT 1 ML INTO MUSCLE EVERY 28 DAYS, Disp: 1 mL, Rfl: 6   ergocalciferol (VITAMIN D2) 1.25 MG (50000 UT) capsule, Take 1 capsule (50,000 Units total) by mouth once a week., Disp: 12 capsule, Rfl: 3   ezetimibe (ZETIA) 10 MG tablet, Take 1 tablet (10 mg total) by mouth daily., Disp: 90 tablet, Rfl: 3   folic acid (FOLVITE) 1 MG tablet, Take 1 mg by mouth daily., Disp: , Rfl:    hydrochlorothiazide (MICROZIDE) 12.5 MG capsule, Take 1 capsule (12.5 mg total) by mouth daily., Disp: 90 capsule, Rfl: 3   levETIRAcetam (KEPPRA) 250 MG tablet, Take 250 mg by mouth at bedtime., Disp: , Rfl:    olmesartan (BENICAR) 40 MG tablet, Take 1 tablet (40 mg total) by mouth daily. (Patient taking differently: Take 40 mg by mouth daily. Pt takes '20mg'$  in am and 20 mg in PM), Disp: 90 tablet, Rfl: 3   oxyCODONE (OXY IR/ROXICODONE) 5 MG immediate release tablet, Take 1 tablet (5 mg total) by mouth every 6 (six) hours as needed for up to 15 doses for moderate pain  or severe pain., Disp: 15 tablet, Rfl: 0   pantoprazole (PROTONIX) 40 MG tablet, Take 1 tablet (40 mg total) by mouth daily., Disp: 90 tablet, Rfl: 3   pravastatin (PRAVACHOL) 40 MG tablet, Take 1 tablet (40 mg total) by mouth every evening., Disp: 180 tablet, Rfl: 3 Social History   Socioeconomic History   Marital status: Married    Spouse name: Donnie   Number of children: 2   Years of education: 12   Highest education level: Associate degree: occupational, Hotel manager, or vocational program  Occupational History   Occupation: retired    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  Tobacco Use   Smoking status: Former    Packs/day: 0.25    Types: Cigarettes    Passive exposure: Never    Smokeless tobacco: Never   Tobacco comments:    Trying to cut on number of cigarettes she smokes.  Vaping Use   Vaping Use: Never used  Substance and Sexual Activity   Alcohol use: Not Currently    Comment: occassionally    Drug use: No   Sexual activity: Not on file  Other Topics Concern   Not on file  Social History Narrative   Lives with her husband, Jody Wilson, and has two grown sons and two grand sons.   She visits family and has a once weekly outing with a friend for socializing.  She enjoys making beaded jewelry, reading and walking.   Social Determinants of Health   Financial Resource Strain: Low Risk  (12/24/2022)   Overall Financial Resource Strain (CARDIA)    Difficulty of Paying Living Expenses: Not hard at all  Food Insecurity: No Food Insecurity (02/10/2023)   Hunger Vital Sign    Worried About Running Out of Food in the Last Year: Never true    Ran Out of Food in the Last Year: Never true  Transportation Needs: No Transportation Needs (02/10/2023)   PRAPARE - Hydrologist (Medical): No    Lack of Transportation (Non-Medical): No  Physical Activity: Inactive (12/24/2022)   Exercise Vital Sign    Days of Exercise per Week: 0 days    Minutes of Exercise per Session: 0 min  Stress: No Stress Concern Present (12/24/2022)   [place]    Feeling of Stress : Not at all  Social Connections: [place] (12/24/2022)   Social Connection and Isolation Panel [NHANES]    Frequency of Communication with Friends and Family: More than three times a week    Frequency of Social Gatherings with Friends and Family: More than three times a week    Attends Religious Services: More than 4 times per year    Active Member of [employer] or Organizations: Yes    Attends Music therapist: More than 4 times per year    Marital Status: Married  Human resources officer Violence: Not At Risk (02/10/2023)    Humiliation, Afraid, Rape, and Kick questionnaire    Fear of Current or Ex-Partner: No    Emotionally Abused: No    Physically Abused: No    Sexually Abused: No   Family History  Problem Relation Age of Onset   Heart disease Mother    Hypertension Mother    Hyperlipidemia Mother    Diabetes Mother    Congestive Heart Failure Mother    Aneurysm Paternal Uncle    Heart attack Maternal Grandmother    Heart  attack Maternal Grandfather    Hypertension Son    Other Son 53       Kienbock's Disease   Liver disease Son 51       Gilbert's Disease   Lupus Half-Sister    Atrial fibrillation Half-Sister    Breast cancer Niece     Objective: Office vital signs reviewed. BP 99/60   Pulse 80   Temp 98.7 F (37.1 C)   Ht '5\' 5"'$  (1.651 m)   Wt 146 lb (66.2 kg)   SpO2 98%   BMI 24.30 kg/m   Physical Examination:  General: Awake, alert, well nourished, No acute distress HEENT: sclera white, MMM Cardio: regular rate and rhythm, S1S2 heard, no murmurs appreciated Pulm: clear to auscultation bilaterally, no wheezes, rhonchi or rales; normal work of breathing on room air Extremities: warm, well perfused, No edema, cyanosis or clubbing; +2 pulses bilaterally MSK: Antalgic gait.  Utilizing rolling walker for ambulation.  Right hip with postsurgical site that has no evidence of secondary infection, no dehiscence nor any erythema or warmth  Assessment/ Plan: 72 y.o. female   Essential hypertension - Plan: CMP14+EGFR, hydrochlorothiazide (MICROZIDE) 12.5 MG capsule, olmesartan (BENICAR) 40 MG tablet  Hyperlipidemia with target LDL less than 100 - Plan: CMP14+EGFR, pravastatin (PRAVACHOL) 40 MG tablet  History of hip surgery - Plan: CBC  Anxiety and depression - Plan: citalopram (CELEXA) 40 MG tablet  Blood pressure is a little too low so I would like her to back back down on the hydrochlorothiazide to just 1/2 tablet daily.  Continue to monitor blood pressures closely at home.  Check  CBC given hip surgery.  No evidence of infection or other complications at this time.  Keep follow-up with PCP as directed.  No orders of the defined types were placed in this encounter.  No orders of the defined types were placed in this encounter.    Janora Norlander, DO [place] [phone]

## 2023-03-02 ENCOUNTER — Telehealth: Payer: Self-pay | Admitting: Family Medicine

## 2023-03-02 LAB — CMP14+EGFR
ALT: 46 IU/L — ABNORMAL HIGH (ref 0–32)
AST: 45 IU/L — ABNORMAL HIGH (ref 0–40)
Albumin/Globulin Ratio: 1.2 (ref 1.2–2.2)
Albumin: 4.1 g/dL (ref 3.8–4.8)
Alkaline Phosphatase: 187 IU/L — ABNORMAL HIGH (ref 44–121)
BUN/Creatinine Ratio: 23 (ref 12–28)
BUN: 22 mg/dL (ref 8–27)
Bilirubin Total: 0.4 mg/dL (ref 0.0–1.2)
CO2: 22 mmol/L (ref 20–29)
Calcium: 10.2 mg/dL (ref 8.7–10.3)
Chloride: 101 mmol/L (ref 96–106)
Creatinine, Ser: 0.97 mg/dL (ref 0.57–1.00)
Globulin, Total: 3.3 g/dL (ref 1.5–4.5)
Glucose: 102 mg/dL — ABNORMAL HIGH (ref 70–99)
Potassium: 6.2 mmol/L (ref 3.5–5.2)
Sodium: 138 mmol/L (ref 134–144)
Total Protein: 7.4 g/dL (ref 6.0–8.5)
eGFR: 62 mL/min/{1.73_m2} (ref >59)

## 2023-03-02 LAB — CBC
Hematocrit: 42.3 % (ref 34.0–46.6)
Hemoglobin: 14.4 g/dL (ref 11.1–15.9)
MCH: 34.6 pg — ABNORMAL HIGH (ref 26.6–33.0)
MCHC: 34 g/dL (ref 31.5–35.7)
MCV: 102 fL — ABNORMAL HIGH (ref 79–97)
Platelets: 503 10*3/uL — ABNORMAL HIGH (ref 150–450)
RBC: 4.16 x10E6/uL (ref 3.77–5.28)
RDW: 11.8 % (ref 11.7–15.4)
WBC: 6.8 10*3/uL (ref 3.4–10.8)

## 2023-03-02 NOTE — Telephone Encounter (Signed)
Looks like Dr. Lajuana Ripple is already put in a result note for this, she is going to come in for recheck on Friday according to that note.  Will await the repeat results.

## 2023-03-02 NOTE — Telephone Encounter (Signed)
Jerrod called from [employer] [facility] with critical lab result.  Potassium 6.2

## 2023-03-04 ENCOUNTER — Telehealth: Payer: Self-pay | Admitting: Family Medicine

## 2023-03-04 DIAGNOSIS — E875 Hyperkalemia: Secondary | ICD-10-CM

## 2023-03-04 NOTE — Telephone Encounter (Signed)
Patient calling back about lab results from 3/12 - encounter closed. Please call back.

## 2023-03-04 NOTE — Telephone Encounter (Signed)
Pt has been informed and understood. She will come by next week to repeat CMP.

## 2023-03-21 ENCOUNTER — Other Ambulatory Visit: Payer: Medicare Other

## 2023-03-21 DIAGNOSIS — E875 Hyperkalemia: Secondary | ICD-10-CM

## 2023-03-22 ENCOUNTER — Ambulatory Visit: Payer: Medicare Other

## 2023-03-22 ENCOUNTER — Ambulatory Visit: Payer: Medicare Other | Admitting: Cardiology

## 2023-03-22 ENCOUNTER — Encounter: Payer: Self-pay | Admitting: Cardiology

## 2023-03-22 ENCOUNTER — Other Ambulatory Visit: Payer: Self-pay

## 2023-03-22 VITALS — BP 123/75 | HR 77 | Resp 17 | Ht 65.0 in | Wt 139.0 lb

## 2023-03-22 DIAGNOSIS — I2510 Atherosclerotic heart disease of native coronary artery without angina pectoris: Secondary | ICD-10-CM

## 2023-03-22 DIAGNOSIS — Z87891 Personal history of nicotine dependence: Secondary | ICD-10-CM | POA: Diagnosis not present

## 2023-03-22 DIAGNOSIS — E781 Pure hyperglyceridemia: Secondary | ICD-10-CM | POA: Diagnosis not present

## 2023-03-22 DIAGNOSIS — I2584 Coronary atherosclerosis due to calcified coronary lesion: Secondary | ICD-10-CM | POA: Diagnosis not present

## 2023-03-22 DIAGNOSIS — I700 Atherosclerosis of aorta: Secondary | ICD-10-CM

## 2023-03-22 DIAGNOSIS — I6521 Occlusion and stenosis of right carotid artery: Secondary | ICD-10-CM

## 2023-03-22 DIAGNOSIS — I10 Essential (primary) hypertension: Secondary | ICD-10-CM

## 2023-03-22 DIAGNOSIS — E782 Mixed hyperlipidemia: Secondary | ICD-10-CM | POA: Diagnosis not present

## 2023-03-22 LAB — CMP14+EGFR
ALT: 6 IU/L (ref 0–32)
AST: 13 IU/L (ref 0–40)
Albumin/Globulin Ratio: 1.6 (ref 1.2–2.2)
Albumin: 4.3 g/dL (ref 3.8–4.8)
Alkaline Phosphatase: 101 IU/L (ref 44–121)
BUN/Creatinine Ratio: 19 (ref 12–28)
BUN: 15 mg/dL (ref 8–27)
Bilirubin Total: 0.6 mg/dL (ref 0.0–1.2)
CO2: 25 mmol/L (ref 20–29)
Calcium: 10.1 mg/dL (ref 8.7–10.3)
Chloride: 97 mmol/L (ref 96–106)
Creatinine, Ser: 0.77 mg/dL (ref 0.57–1.00)
Globulin, Total: 2.7 g/dL (ref 1.5–4.5)
Glucose: 102 mg/dL — ABNORMAL HIGH (ref 70–99)
Potassium: 3.4 mmol/L — ABNORMAL LOW (ref 3.5–5.2)
Sodium: 141 mmol/L (ref 134–144)
Total Protein: 7 g/dL (ref 6.0–8.5)
eGFR: 82 mL/min/{1.73_m2} (ref >59)

## 2023-03-22 NOTE — Progress Notes (Unsigned)
ID:  AH TROST, DOB 1951/07/08, MRN [id number]  PCP:  Dettinger, Fransisca Kaufmann, MD  Cardiologist:  Rex Kras, DO, [facility] (established care 07/29/2020)  Date: 03/22/23 Last Office Visit: 11/18/2021  Chief Complaint  Patient presents with   Coronary Artery Disease   Hospitalization Follow-up    HPI  Jody Wilson is a 72 y.o. female whose past medical history and cardiovascular risk factors include: hypertension, hyperlipidemia, GERD, former smoker, Thoracic aortic atherosclerosis, coronary artery calcification, advanced age, postmenopausal female.  Patient is referred to the office at the request of her primary care provider for evaluation of syncope.  Patient underwent an ischemic work-up as well as a carotid duplex.  Results noted below for further reference.  Patient was noted to have right ICA stenosis which is being followed by carotid duplex.  She had a recent carotid duplex which notes improvement in the right ICA stenosis.  She is currently on aspirin, statin therapy.  Most recent lipid profile from 10/22/2021 independently reviewed.  LDL is within acceptable range however, triglycerides are not well controlled.  Patient was given a prescription for Vascepa which was expensive and cost prohibitive.  Patient states that her PCP also provided a similar prescription which was generic and that too was expensive.  She has not had a chance to apply for patient assistance program.  She denies any chest pain at rest or with effort related activities.  Her shortness of breath with effort related activities remains relatively chronic and stable.  Patient had quit smoking since establishing care with myself.  However, in the interim due to stress she briefly started smoking again and now has stopped.    Two fall since nex visit  Breast fx  Right hip fx   Orthopedic surgery consulted and admitted. Underwent right hip hemiarthroplasty by Dr. Amedeo Kinsman 02/11/23.  No smoking feb  2024  Pulmonary nodule: 7 mm RUL nodule on CXR>Short interval two-view CXR or CT chest recommended. Will give instruction on discharge.  Given patient's smoking history I strongly advised for her to have follow-up w/ PCP/PULm- will get chest x-ray to  priot ot discharge today   FUNCTIONAL STATUS: Walks 64min everyday 4 days a week.    ALLERGIES: Allergies  Allergen Reactions   Tetracyclines & Related Itching and Swelling    MEDICATION LIST PRIOR TO VISIT: Current Meds  Medication Sig   ALPRAZolam (XANAX) 0.5 MG tablet Take 1 tablet (0.5 mg total) by mouth 2 (two) times daily as needed for up to 2 doses for anxiety.   aspirin EC 81 MG tablet Take 1 tablet (81 mg total) by mouth 2 (two) times daily. Swallow whole.   calcium-vitamin D (OSCAL WITH D) 500-200 MG-UNIT tablet Take 1 tablet by mouth daily with lunch.    citalopram (CELEXA) 40 MG tablet Take 1 tablet (40 mg total) by mouth daily.   clobetasol cream (TEMOVATE) [id number] % Apply 1 Application topically 2 (two) times daily.   cyanocobalamin (VITAMIN B12) 1000 MCG/ML injection INJECT 1 ML INTO MUSCLE EVERY 28 DAYS   ergocalciferol (VITAMIN D2) 1.25 MG (50000 UT) capsule Take 1 capsule (50,000 Units total) by mouth once a week.   ezetimibe (ZETIA) 10 MG tablet Take 1 tablet (10 mg total) by mouth daily.   folic acid (FOLVITE) 1 MG tablet Take 1 mg by mouth daily.   hydrochlorothiazide (MICROZIDE) 12.5 MG capsule Take 1 capsule (12.5 mg total) by mouth daily.   levETIRAcetam (KEPPRA) 250 MG tablet Take 250 mg by  mouth at bedtime.   metroNIDAZOLE (METROGEL) 0.75 % vaginal gel Place 1 Applicatorful vaginally 2 (two) times daily.   olmesartan (BENICAR) 40 MG tablet Take 1 tablet (40 mg total) by mouth daily.   pantoprazole (PROTONIX) 40 MG tablet Take 1 tablet (40 mg total) by mouth daily.   pravastatin (PRAVACHOL) 40 MG tablet Take 1 tablet (40 mg total) by mouth every evening.     PAST MEDICAL HISTORY: Past Medical History:  Diagnosis  Date   Anemia    Anxiety    Aortic atherosclerosis    Depression    GERD (gastroesophageal reflux disease)    Hyperlipidemia    Hypertension    Orthostatic hypotension    Osteoporosis     PAST SURGICAL HISTORY: Past Surgical History:  Procedure Laterality Date   ABDOMINAL HYSTERECTOMY     BACK SURGERY     CHOLECYSTECTOMY     FOOT SURGERY     7 in the past   HIP ARTHROPLASTY Right 02/11/2023   Procedure: ARTHROPLASTY BIPOLAR HIP (HEMIARTHROPLASTY);  Surgeon: Mordecai Rasmussen, MD;  Location: [facility];  Service: Orthopedics;  Laterality: Right;   IR GENERIC HISTORICAL  12/09/2016   IR RADIOLOGIST EVAL & MGMT 12/09/2016 [facility]   IR GENERIC HISTORICAL  12/10/2016   IR VERTEBROPLASTY CERV/THOR BX INC UNI/BIL INC/INJECT/IMAGING 12/10/2016 [facility]   LEFT HEART CATH AND CORONARY ANGIOGRAPHY N/A 09/30/2020   Procedure: LEFT HEART CATH AND CORONARY ANGIOGRAPHY;  Surgeon: Nigel Mormon, MD;  Location: [facility];  Service: Cardiovascular;  Laterality: N/A;   TRACHEOSTOMY TUBE PLACEMENT  1982    FAMILY HISTORY: The patient family history includes Aneurysm in her paternal uncle; Atrial fibrillation in her half-sister; Breast cancer in her niece; Congestive Heart Failure in her mother; Diabetes in her mother; Heart attack in her maternal grandfather and maternal grandmother; Heart disease in her mother; Hyperlipidemia in her mother; Hypertension in her mother and son; Liver disease (age of onset: 48) in her son; Lupus in her half-sister; Other (age of onset: 16) in her son.  SOCIAL HISTORY:  The patient  reports that she quit smoking about 4 weeks ago. Her smoking use included cigarettes. She has a 6.25 pack-year smoking history. She has never been exposed to tobacco smoke. She has never used smokeless tobacco. She reports that she does not currently use alcohol. She reports that she does not use drugs.  REVIEW OF SYSTEMS: Review of Systems  Constitutional: Negative for  chills and fever.  HENT:  Negative for hoarse voice and nosebleeds.   Eyes:  Negative for discharge, double vision and pain.  Cardiovascular:  Positive for dyspnea on exertion. Negative for chest pain, claudication, leg swelling, near-syncope, orthopnea, palpitations, paroxysmal nocturnal dyspnea and syncope.  Respiratory:  Negative for hemoptysis and shortness of breath.   Musculoskeletal:  Negative for muscle cramps and myalgias.  Gastrointestinal:  Negative for abdominal pain, constipation, diarrhea, hematemesis, hematochezia, melena, nausea and vomiting.  Neurological:  Negative for dizziness and light-headedness.    PHYSICAL EXAM:    03/22/2023    1:30 PM 03/01/2023    2:57 PM 02/25/2023   10:28 AM  Vitals with BMI  Height 5\' 5"  5\' 5"  5\' 5"   Weight 139 lbs 146 lbs 143 lbs  BMI 23.13 [id number]  Systolic [id number] 99   Diastolic 75 60   Pulse 77 80    Physical Exam  Constitutional: No distress.  Age appropriate, hemodynamically stable, walks with cane  Neck: No JVD present.  Cardiovascular: Normal rate, regular rhythm, S1 normal, S2 normal, intact distal pulses and normal pulses. Exam reveals no gallop, no S3 and no S4.  No murmur heard. Pulmonary/Chest: Effort normal and breath sounds normal. No stridor. She has no wheezes. She has no rales.  Abdominal: Soft. Bowel sounds are normal. She exhibits no distension. There is no abdominal tenderness.  Musculoskeletal:        General: No edema.     Cervical back: Neck supple.  Neurological: She is alert and oriented to person, place, and time. She has intact cranial nerves (2-12).  Skin: Skin is warm and moist.     RADIOLOGY:  MR Abdomen w/ and w/o contrast 06/03/2020:  1. Area of concern on prior chest CT represents geographic fat intensification on a background of global mild hepatic steatosis. No suspicious lesion in the liver with lobular contours. Correlate with any clinical or laboratory evidence of liver disease. 2. Small focal  outpouching approximately 7 x 7 mm along the distal thoracic aorta may represent a penetrating atherosclerotic ulcer not associated with acute features and unchanged since previous chest CT. Consider CT angiogram follow-up in 1 year with vascular surgery consultation to guide further follow-up. 3. Large hiatal hernia extending into the chest as on the recent CT of the chest. 4. Compression fracture at T11 better demonstrated on recent chest CT. 5. Aortic atherosclerosis.  CT chest lung cancer screening: 04/25/2020: Mild coronary atherosclerosis of the LAD.  CARDIAC DATABASE: EKG: : Full year March 22, 2023: Sinus rhythm, 72 bpm, normal axis, without underlying ischemia or injury pattern.  Echocardiogram: 06/23/2020: LVEF 60-65%, indeterminate diastolic parameters, no pulmonary hypertension, RVSP 20 mmHg mild MR, aortic valve sclerosis without stenosis.  Stress Testing: Exercise Myoview stress test 08/13/2020: Exercise nuclear stress test was performed using Bruce protocol. Patient reached 5.0 METS, and 92% of age predicted maximum heart rate. Exercise capacity was low. Chest pain not reported. Exertional dyspnea and dizziness reported. Heart rate and hemodynamic response were normal Stress EKG demonstrated sinus tachycardia, 1.5-2 mm horizontal/upsloping ST depression in leads II, III, aVF, V5, V6, normalizing 2 min into recovery. EKG changes are positive for ischemia.  Normal myocardial perfusion. Stress LVEF 73%. Intermediate risk study due to EKG changes, low exercise capacity.   Heart Catheterization: 09/30/2020: Right dominant circulation Normal coronary arteries Normal LVEDP  Carotid duplex: 06/23/2020:  Right Carotid: Velocities in the right ICA are consistent with a 1-39% stenosis. Left Carotid: Velocities in the left ICA are consistent with a 1-39% stenosis. Vertebrals: Bilateral vertebral arteries demonstrate antegrade flow.   04/30/2021: Doppler velocity suggests stenosis in the  right internal carotid artery (50-69%), lower limit of the spectrum. Doppler velocity suggests stenosis in the left internal carotid artery (1-15%). Antegrade right vertebral artery flow. Antegrade left vertebral artery flow. Compared to 06/23/2020, right ICA stenosis is new. Follow up in six months is appropriate if clinically indicated.  Carotid artery duplex 11/03/2021:  Duplex suggests stenosis in the right internal carotid artery (1-15%). Duplex suggests stenosis in the left internal carotid artery (minimal). Antegrade right vertebral artery flow. Antegrade left vertebral artery flow. Compared to the previous study done on 04/30/2021, right ICA stenosis of 50 to 69% in the lower limit of the spectrum he is now not evident. There is minimal plaque noted in the bilateral carotid arteries. Further studies if clinically indicated.  14 day extended Holter monitor: Dominant rhythm normal sinus. Heart rate 52 -150bpm.  Avg HR 73 bpm. No atrial fibrillation, ventricular tachycardia, high grade  AV block, pauses (3 seconds or longer). Total ventricular ectopic burden <1%. Total supraventricular ectopic burden <1%. Patient triggered events: 9.  Underlying rhythm normal sinus with the exception of one event noted NSR with transition to SVT underlying rhythm difficult to determine due to baseline artifact.   LABORATORY DATA:    Latest Ref Rng & Units 03/01/2023    3:24 PM 02/12/2023    4:49 AM 02/11/2023    6:34 AM  CBC  WBC 3.4 - 10.8 x10E3/uL 6.8  7.4  6.7   Hemoglobin 11.1 - 15.9 g/dL 14.4  12.0  13.6   Hematocrit 34.0 - 46.6 % 42.3  35.4  40.1   Platelets 150 - 450 x10E3/uL 503  190  178        Latest Ref Rng & Units 03/21/2023    1:10 PM 03/01/2023    3:24 PM 02/13/2023    8:46 AM  CMP  Glucose 70 - 99 mg/dL 102  102  113   BUN 8 - 27 mg/dL 15  22  11    Creatinine 0.57 - 1.00 mg/dL 0.77  0.97  0.44   Sodium 134 - 144 mmol/L 141  138  132   Potassium 3.5 - 5.2 mmol/L 3.4  6.2  3.6    Chloride 96 - 106 mmol/L 97  101  102   CO2 20 - 29 mmol/L 25  22  24    Calcium 8.7 - 10.3 mg/dL 10.1  10.2  8.0   Total Protein 6.0 - 8.5 g/dL 7.0  7.4    Total Bilirubin 0.0 - 1.2 mg/dL 0.6  0.4    Alkaline Phos 44 - 121 IU/L 101  187    AST 0 - 40 IU/L 13  45    ALT 0 - 32 IU/L 6  46      Lab Results  Component Value Date   CHOL 176 12/30/2022   HDL 94 12/30/2022   LDLCALC 66 12/30/2022   LDLDIRECT 83 07/07/2021   TRIG 90 12/30/2022   CHOLHDL 1.9 12/30/2022    No components found for: "NTPROBNP" No results for input(s): "PROBNP" in the last 8760 hours.  No results for input(s): "TSH" in the last 8760 hours.   BMP Recent Labs    02/11/23 0634 02/12/23 0449 02/13/23 0846 03/01/23 1524 03/21/23 1310  NA 132* 133* 132* 138 141  K 5.3* 4.9 3.6 6.2* 3.4*  CL 99 100 102 101 97  CO2 27 28 24 22 25   GLUCOSE 107* 136* 113* 102* 102*  BUN 8 9 11 22 15   CREATININE 0.58 0.56 0.44 0.97 0.77  CALCIUM 8.5* 8.3* 8.0* 10.2 10.1  GFRNONAA >60 >60 >60  --   --      HEMOGLOBIN A1C No results found for: "HGBA1C", "MPG"  IMPRESSION:    ICD-10-CM   1. Coronary atherosclerosis due to calcified coronary lesion of native artery  I25.10 EKG 12-Lead   I25.84        RECOMMENDATIONS: Jody Wilson is a 72 y.o. female whose past medical history and cardiac risk factors include:  hypertension, hyperlipidemia, GERD, former smoker, Thoracic aortic atherosclerosis, coronary artery calcification, advanced age, postmenopausal female.  Stenosis of right carotid artery Interval improvement of right ICA stenosis when compared to prior duplex results. Continue aspirin and statin therapy. Reeducated on the importance of complete smoking cessation. Repeat carotid duplex in 1 year prior to the annual follow-up visit Triglycerides still elevated.  We will send another prescription in for Vascepa  with the intentions of her requiring patient assistance.  Patient will call the office if the  prescription is still cost prohibitive.  Coronary atherosclerosis due to calcified coronary lesion of native artery Denies any chest pain or anginal equivalent. Ischemic work-up reviewed as part of medical decision-making process during today's office visit. Educated on the importance of improving her modifiable cardiovascular risk factors. Continue to monitor  Hypertriglyceridemia Educated on lifestyle changes to help improve her triglyceride levels. Prescription of Vascepa resent to the pharmacy.  Mixed hyperlipidemia Currently on pravastatin.   She denies myalgia or other side effects. Most recent lipids dated 10/22/2021 reviewed as noted above.  Essential hypertension Home blood pressures are well controlled. Office blood pressures are slightly elevated. Medications reconciled. Reemphasized the importance of low-salt diet. Currently managed by primary care provider.  Former smoker Initially quit 05/16/2021 with short relapse since last office visit. Currently smoke-free. Educated on importance of complete smoking cessation.   FINAL MEDICATION LIST END OF ENCOUNTER: No orders of the defined types were placed in this encounter.    There are no discontinued medications.    Current Outpatient Medications:    ALPRAZolam (XANAX) 0.5 MG tablet, Take 1 tablet (0.5 mg total) by mouth 2 (two) times daily as needed for up to 2 doses for anxiety., Disp: 2 tablet, Rfl: 0   aspirin EC 81 MG tablet, Take 1 tablet (81 mg total) by mouth 2 (two) times daily. Swallow whole., Disp: 30 tablet, Rfl: 0   calcium-vitamin D (OSCAL WITH D) 500-200 MG-UNIT tablet, Take 1 tablet by mouth daily with lunch. , Disp: , Rfl:    citalopram (CELEXA) 40 MG tablet, Take 1 tablet (40 mg total) by mouth daily., Disp: 90 tablet, Rfl: 1   clobetasol cream (TEMOVATE) [id number] %, Apply 1 Application topically 2 (two) times daily., Disp: , Rfl:    cyanocobalamin (VITAMIN B12) 1000 MCG/ML injection, INJECT 1 ML INTO  MUSCLE EVERY 28 DAYS, Disp: 1 mL, Rfl: 6   ergocalciferol (VITAMIN D2) 1.25 MG (50000 UT) capsule, Take 1 capsule (50,000 Units total) by mouth once a week., Disp: 12 capsule, Rfl: 3   ezetimibe (ZETIA) 10 MG tablet, Take 1 tablet (10 mg total) by mouth daily., Disp: 90 tablet, Rfl: 3   folic acid (FOLVITE) 1 MG tablet, Take 1 mg by mouth daily., Disp: , Rfl:    hydrochlorothiazide (MICROZIDE) 12.5 MG capsule, Take 1 capsule (12.5 mg total) by mouth daily., Disp: 90 capsule, Rfl: 3   levETIRAcetam (KEPPRA) 250 MG tablet, Take 250 mg by mouth at bedtime., Disp: , Rfl:    metroNIDAZOLE (METROGEL) 0.75 % vaginal gel, Place 1 Applicatorful vaginally 2 (two) times daily., Disp: , Rfl:    olmesartan (BENICAR) 40 MG tablet, Take 1 tablet (40 mg total) by mouth daily., Disp: 90 tablet, Rfl: 3   pantoprazole (PROTONIX) 40 MG tablet, Take 1 tablet (40 mg total) by mouth daily., Disp: 90 tablet, Rfl: 3   pravastatin (PRAVACHOL) 40 MG tablet, Take 1 tablet (40 mg total) by mouth every evening., Disp: 180 tablet, Rfl: 3  Orders Placed This Encounter  Procedures   EKG 12-Lead   There are no Patient Instructions on file for this visit.   --Continue cardiac medications as reconciled in final medication list. We have gone to stop smoking and landing --No follow-ups on file. Or sooner if needed. --Continue follow-up with your primary care physician regarding the management of your other chronic comorbid conditions.  Patient's questions and concerns were addressed  to her satisfaction. She voices understanding of the instructions provided during this encounter.   This note was created using a voice recognition software as a result there may be grammatical errors inadvertently enclosed that do not reflect the nature of this encounter. Every attempt is made to correct such errors.  Rex Kras, [place], [facility]  Pager: [phone] Office: [phone]

## 2023-03-24 ENCOUNTER — Encounter: Payer: Self-pay | Admitting: Cardiology

## 2023-03-25 ENCOUNTER — Other Ambulatory Visit (INDEPENDENT_AMBULATORY_CARE_PROVIDER_SITE_OTHER): Payer: Medicare Other

## 2023-03-25 ENCOUNTER — Ambulatory Visit (INDEPENDENT_AMBULATORY_CARE_PROVIDER_SITE_OTHER): Payer: Medicare Other | Admitting: Orthopedic Surgery

## 2023-03-25 ENCOUNTER — Telehealth: Payer: Self-pay

## 2023-03-25 DIAGNOSIS — Z96649 Presence of unspecified artificial hip joint: Secondary | ICD-10-CM | POA: Diagnosis not present

## 2023-03-25 MED ORDER — TRAMADOL HCL 50 MG PO TABS: 50.0000 mg | ORAL_TABLET | Freq: Two times a day (BID) | ORAL | 0 refills | Status: DC | PRN

## 2023-03-25 NOTE — Progress Notes (Signed)
Orthopaedic Postop Note  Assessment: Jody Wilson is a 72 y.o. female s/p Right hip hemiarthroplasty  DOS: 02/11/2023  Plan: Mrs. Schaal reports that she has recently had an onset of right groin pain.  This happened within the last week after she transitioned to a cane instead of a walker.  Radiographs look good, no change.  Incision healing well.  No concern for a new injury.  She has likely transitioned to a cane too quickly.  Recommend use of the walker.  Provided Tramadol for pain.  Follow up in 6 weeks.    Follow-up: Return in about 6 weeks (around 05/06/2023). XR at next visit: AP pelvis and Right hip  Subjective:  Chief Complaint  Patient presents with   Routine Post Op    R hip DOS 02/11/23    History of Present Illness: Jody Wilson is a 72 y.o. female who presents following the above stated procedure.  Surgery was 6 weeks ago.  She was doing well until this week.  She transitioned to a cane this week, holding it in her right hand.  Since starting with the cane, she has been experiencing pain in her right groin.  Walking on her own at home, completing exercises.   Review of Systems: No fevers or chills No numbness or tingling No Chest Pain No shortness of breath   Objective: There were no vitals taken for this visit.  Physical Exam:  Alert and oriented.  No acute distress.  Slow steady gait with use of a walker.  Lateral based hip incision is healing well.  No surrounding erythema or drainage.  No tenderness to palpation.  She is able to maintain a straight leg raise.  She tolerates axial loading.  She tolerates gentle range of motion of her hip.Pain with axial loading and motion in her right hip.  Sensation intact over the dorsum of the right foot.  IMAGING: I personally ordered and reviewed the following images:  XR of the Right hip and AP pelvis demonstrates a hip arthroplasty in good position.  The hip remains reduced.  There is no evidence of implant  subsidence.  No acute fractures are noted.  Impression: Right hip hemiarthroplasty in stable position; no evidence of subsidence compared to prior XR   Oliver Barre, MD 03/25/2023 11:14 PM

## 2023-03-25 NOTE — Telephone Encounter (Signed)
Patient called stating that Dr. Dallas Schimke was going to send her in some Tramadol, but her pharmacy hasn't receive anything.  PATIENT USES [facility]   Pharmacy closes at 6 pm.

## 2023-03-25 NOTE — Progress Notes (Incomplete)
Orthopaedic Postop Note  Assessment: Jody Wilson is a 72 y.o. female s/p Right hip hemiarthroplasty  DOS: 02/11/2023  Plan: Sutures trimmed, steri strips placed Continue using hip abduction pillow while in bed until 6 weeks after surgery Continue posterior hip precautions until 3 months postop Continue with DVT prophylaxis for at least 6 weeks after surgery WBAT on the operative extremity Follow up in 4 weeks; call with any issues     Follow-up: Return in about 6 weeks (around 05/06/2023). XR at next visit: AP pelvis and Right hip  Subjective:  Chief Complaint  Patient presents with  . Routine Post Op    R hip DOS 02/11/23    History of Present Illness: Jody Wilson is a 72 y.o. female who presents following the above stated procedure.  Surgery was 2 weeks ago.  She has done well.  She is in a nursing home, but will be discharged tomorrow.  She states that therapy has provided her with reassurance, telling her she is doing very well.  She is ambulating well with a walker.  She is taking some pain medication daily, but this is limited.  She has noted some swelling distally, but this improves when she gets off her feet.  Review of Systems: No fevers or chills No numbness or tingling No Chest Pain No shortness of breath   Objective: There were no vitals taken for this visit.  Physical Exam:  Alert and oriented.  No acute distress.  Slow steady gait with use of a walker.  Lateral based hip incision is healing well.  No surrounding erythema or drainage.  No tenderness to palpation.  She is able to maintain a straight leg raise.  She tolerates axial loading.  She tolerates gentle range of motion of her hip.  Sensation intact over the dorsum of the right foot.  IMAGING: I personally ordered and reviewed the following images:  XR of the Right hip and AP pelvis demonstrates a hip arthroplasty in good position.  The hip remains reduced.  There is no evidence of implant  subsidence.  No acute fractures are noted.  Impression: Right hip hemiarthroplasty in stable position, without evidence of migration or subsidence compared to prior XR   Oliver Barre, MD 03/25/2023 11:14 PM

## 2023-03-26 ENCOUNTER — Encounter: Payer: Self-pay | Admitting: Orthopedic Surgery

## 2023-03-31 ENCOUNTER — Ambulatory Visit (INDEPENDENT_AMBULATORY_CARE_PROVIDER_SITE_OTHER): Payer: Medicare Other | Admitting: Family Medicine

## 2023-03-31 ENCOUNTER — Encounter: Payer: Self-pay | Admitting: Family Medicine

## 2023-03-31 VITALS — BP 141/74 | HR 65 | Ht 65.0 in | Wt 144.0 lb

## 2023-03-31 DIAGNOSIS — E785 Hyperlipidemia, unspecified: Secondary | ICD-10-CM

## 2023-03-31 DIAGNOSIS — I10 Essential (primary) hypertension: Secondary | ICD-10-CM | POA: Diagnosis not present

## 2023-03-31 DIAGNOSIS — F419 Anxiety disorder, unspecified: Secondary | ICD-10-CM | POA: Diagnosis not present

## 2023-03-31 DIAGNOSIS — F32A Depression, unspecified: Secondary | ICD-10-CM

## 2023-03-31 LAB — CMP14+EGFR
ALT: 8 IU/L (ref 0–32)
AST: 11 IU/L (ref 0–40)
Albumin/Globulin Ratio: 1.7 (ref 1.2–2.2)
Albumin: 3.9 g/dL (ref 3.8–4.8)
Alkaline Phosphatase: 94 IU/L (ref 44–121)
BUN/Creatinine Ratio: 18 (ref 12–28)
BUN: 12 mg/dL (ref 8–27)
Bilirubin Total: 0.6 mg/dL (ref 0.0–1.2)
CO2: 23 mmol/L (ref 20–29)
Calcium: 9.2 mg/dL (ref 8.7–10.3)
Chloride: 103 mmol/L (ref 96–106)
Creatinine, Ser: 0.67 mg/dL (ref 0.57–1.00)
Globulin, Total: 2.3 g/dL (ref 1.5–4.5)
Glucose: 97 mg/dL (ref 70–99)
Potassium: 4.4 mmol/L (ref 3.5–5.2)
Sodium: 140 mmol/L (ref 134–144)
Total Protein: 6.2 g/dL (ref 6.0–8.5)
eGFR: 93 mL/min/{1.73_m2} (ref >59)

## 2023-03-31 LAB — CBC WITH DIFFERENTIAL/PLATELET
Basophils Absolute: 0 10*3/uL (ref 0.0–0.2)
Basos: 0 %
EOS (ABSOLUTE): 0.1 10*3/uL (ref 0.0–0.4)
Eos: 3 %
Hematocrit: 41.4 % (ref 34.0–46.6)
Hemoglobin: 14 g/dL (ref 11.1–15.9)
Immature Grans (Abs): 0 10*3/uL (ref 0.0–0.1)
Immature Granulocytes: 0 %
Lymphocytes Absolute: 1.8 10*3/uL (ref 0.7–3.1)
Lymphs: 31 %
MCH: 34.1 pg — ABNORMAL HIGH (ref 26.6–33.0)
MCHC: 33.8 g/dL (ref 31.5–35.7)
MCV: 101 fL — ABNORMAL HIGH (ref 79–97)
Monocytes Absolute: 0.4 10*3/uL (ref 0.1–0.9)
Monocytes: 7 %
Neutrophils Absolute: 3.3 10*3/uL (ref 1.4–7.0)
Neutrophils: 59 %
Platelets: 279 10*3/uL (ref 150–450)
RBC: 4.11 x10E6/uL (ref 3.77–5.28)
RDW: 12.5 % (ref 11.7–15.4)
WBC: 5.7 10*3/uL (ref 3.4–10.8)

## 2023-03-31 MED ORDER — ALPRAZOLAM 0.5 MG PO TABS: 0.5000 mg | ORAL_TABLET | Freq: Two times a day (BID) | ORAL | 2 refills | Status: DC | PRN

## 2023-03-31 NOTE — Progress Notes (Signed)
BP (!) 141/74   Pulse 65   Ht 5\' 5"  (1.651 m)   Wt 144 lb (65.3 kg)   SpO2 99%   BMI 23.96 kg/m    Subjective:   Patient ID: Jody Wilson, female    DOB: 04-19-1951, 72 y.o.   MRN: [id number]  HPI: Jody Wilson is a 72 y.o. female presenting on 03/31/2023 for Medical Management of Chronic Issues, Hypertension, Anxiety, and Depression   HPI Hypertension Patient is currently on olmesartan and hydrochlorothiazide, and their blood pressure today is 141/74. Patient denies any lightheadedness or dizziness. Patient denies headaches, blurred vision, chest pains, shortness of breath, or weakness. Denies any side effects from medication and is content with current medication.   Hyperlipidemia Patient is coming in for recheck of his hyperlipidemia. The patient is currently taking pravastatin and Zetia. They deny any issues with myalgias or history of liver damage from it. They deny any focal numbness or weakness or chest pain.   Anxiety and depression recheck Current rx-Xanax 0.5 mg twice daily as needed # meds rx-60/month Effectiveness of current meds-works well Adverse reactions form meds-none  Pill count performed-No Last drug screen -06/04/2022 ( high risk q40m, moderate risk q98m, low risk yearly ) Urine drug screen today- No Was the N[facility] reviewed-yes  If yes were their any concerning findings? -With her recent hip fracture she has had some pain medicine prescribed, she is tapering off of them but recommended heavily that she avoid using within 3 or 4 hours of the benzodiazepine.  No flowsheet data found.   Controlled substance contract signed on: 06/04/2022    03/31/2023    9:57 AM 03/01/2023    2:57 PM 12/30/2022    2:03 PM 12/24/2022    9:15 AM 11/02/2022   12:33 PM  Depression screen PHQ 2/9  Decreased Interest 1 0 0 1 2  Down, Depressed, Hopeless 0 0 0 0 0  PHQ - 2 Score 1 0 0 1 2  Altered sleeping 1 0 0 2 3  Tired, decreased energy 1 0 0 2 2  Change in appetite 1  0 3 1 2   Feeling bad or failure about yourself  0 0 0 0 0  Trouble concentrating 0 0 0 0 0  Moving slowly or fidgety/restless 0 0 3 0 0  Suicidal thoughts 0 0 0 0 0  PHQ-9 Score 4 0 6 6 9   Difficult doing work/chores Somewhat difficult Not difficult at all Somewhat difficult Somewhat difficult Somewhat difficult     Relevant past medical, surgical, family and social history reviewed and updated as indicated. Interim medical history since our last visit reviewed. Allergies and medications reviewed and updated.  Review of Systems  Constitutional:  Negative for chills and fever.  Eyes:  Negative for visual disturbance.  Respiratory:  Negative for chest tightness and shortness of breath.   Cardiovascular:  Negative for chest pain and leg swelling.  Genitourinary:  Negative for difficulty urinating and dysuria.  Musculoskeletal:  Negative for back pain and gait problem.  Skin:  Negative for rash.  Neurological:  Negative for dizziness, light-headedness and headaches.  Psychiatric/Behavioral:  Negative for agitation and behavioral problems.   All other systems reviewed and are negative.   Per HPI unless specifically indicated above   Allergies as of 03/31/2023       Reactions   Tetracyclines & Related Itching, Swelling        Medication List        Accurate as  of March 31, 2023 10:19 AM. If you have any questions, ask your nurse or doctor.          ALPRAZolam 0.5 MG tablet Commonly known as: XANAX Take 1 tablet (0.5 mg total) by mouth 2 (two) times daily as needed for anxiety.   aspirin EC 81 MG tablet Take 1 tablet (81 mg total) by mouth 2 (two) times daily. Swallow whole.   calcium-vitamin D 500-200 MG-UNIT tablet Commonly known as: OSCAL WITH D Take 1 tablet by mouth daily with lunch.   citalopram 40 MG tablet Commonly known as: CELEXA Take 1 tablet (40 mg total) by mouth daily.   clobetasol cream 0.05 % Commonly known as: TEMOVATE Apply 1 Application  topically 2 (two) times daily.   cyanocobalamin 1000 MCG/ML injection Commonly known as: VITAMIN B12 INJECT 1 ML INTO MUSCLE EVERY 28 DAYS   ergocalciferol 1.25 MG (50000 UT) capsule Commonly known as: VITAMIN D2 Take 1 capsule (50,000 Units total) by mouth once a week.   ezetimibe 10 MG tablet Commonly known as: ZETIA Take 1 tablet (10 mg total) by mouth daily.   folic acid 1 MG tablet Commonly known as: FOLVITE Take 1 mg by mouth daily.   hydrochlorothiazide 12.5 MG capsule Commonly known as: MICROZIDE Take 1 capsule (12.5 mg total) by mouth daily.   levETIRAcetam 250 MG tablet Commonly known as: KEPPRA Take 250 mg by mouth at bedtime.   metroNIDAZOLE 0.75 % vaginal gel Commonly known as: METROGEL Place 1 Applicatorful vaginally 2 (two) times daily.   olmesartan 40 MG tablet Commonly known as: BENICAR Take 1 tablet (40 mg total) by mouth daily.   pantoprazole 40 MG tablet Commonly known as: PROTONIX Take 1 tablet (40 mg total) by mouth daily.   pravastatin 40 MG tablet Commonly known as: PRAVACHOL Take 1 tablet (40 mg total) by mouth every evening.   traMADol 50 MG tablet Commonly known as: ULTRAM Take 1 tablet (50 mg total) by mouth every 12 (twelve) hours as needed.         Objective:   BP (!) 141/74   Pulse 65   Ht 5\' 5"  (1.651 m)   Wt 144 lb (65.3 kg)   SpO2 99%   BMI 23.96 kg/m   Wt Readings from Last 3 Encounters:  03/31/23 144 lb (65.3 kg)  03/22/23 139 lb (63 kg)  03/01/23 146 lb (66.2 kg)    Physical Exam Vitals and nursing note reviewed.  Constitutional:      General: She is not in acute distress.    Appearance: She is well-developed. She is not diaphoretic.  Eyes:     Conjunctiva/sclera: Conjunctivae normal.  Cardiovascular:     Rate and Rhythm: Normal rate and regular rhythm.     Heart sounds: Normal heart sounds. No murmur heard. Pulmonary:     Effort: Pulmonary effort is normal. No respiratory distress.     Breath sounds:  Normal breath sounds. No wheezing.  Musculoskeletal:        General: No tenderness. Normal range of motion.  Skin:    General: Skin is warm and dry.     Findings: No rash.  Neurological:     Mental Status: She is alert and oriented to person, place, and time.     Coordination: Coordination normal.  Psychiatric:        Behavior: Behavior normal.       Assessment & Plan:   Problem List Items Addressed This Visit  Cardiovascular and Mediastinum   Hypertension - Primary   Relevant Orders   CBC with Differential/Platelet   CMP14+EGFR     Other   Hyperlipidemia with target LDL less than 100   Relevant Orders   CBC with Differential/Platelet   CMP14+EGFR   Anxiety and depression   Relevant Medications   ALPRAZolam (XANAX) 0.5 MG tablet    Refill the alprazolam, recommended that she watch the tramadol and the alprazolam and not take them within a few hours of each other at least.  Will recheck blood work.  She says her hip seems to be doing better and healing. Follow up plan: Return in about 3 months (around 06/30/2023), or if symptoms worsen or fail to improve, for Anxiety and hypertension and cholesterol recheck.  Counseling provided for all of the vaccine components Orders Placed This Encounter  Procedures   CBC with Differential/Platelet   CMP14+EGFR    Arville CareJoshua Quintez Maselli, MD [facility] 03/31/2023, 10:19 AM

## 2023-04-04 NOTE — Progress Notes (Signed)
Gave patient results, she acknowledged understanding and had no further questions.

## 2023-04-06 NOTE — Progress Notes (Signed)
Patient did not complete LDCT as scheduled, reminder letter mailed to patient's listed mailing address asking that the patient call me to schedule follow-up.  

## 2023-05-06 ENCOUNTER — Encounter: Payer: Self-pay | Admitting: Orthopedic Surgery

## 2023-05-06 ENCOUNTER — Other Ambulatory Visit (INDEPENDENT_AMBULATORY_CARE_PROVIDER_SITE_OTHER): Payer: Medicare Other

## 2023-05-06 ENCOUNTER — Ambulatory Visit (INDEPENDENT_AMBULATORY_CARE_PROVIDER_SITE_OTHER): Payer: Medicare Other | Admitting: Orthopedic Surgery

## 2023-05-06 DIAGNOSIS — Z96649 Presence of unspecified artificial hip joint: Secondary | ICD-10-CM

## 2023-05-06 NOTE — Progress Notes (Signed)
Orthopaedic Postop Note  Assessment: Jody Wilson is a 72 y.o. female s/p Right hip hemiarthroplasty  DOS: 02/11/2023  Plan: Jody Wilson continues to have some pain in the groin.  Otherwise, she is progressing appropriately.  She has yet to work with physical therapy, but has been doing exercises on her own.  She is interested in therapy.  We have placed a referral today.  Nothing concerning on radiographs.  Prosthesis remains in good position, without noticeable differences compared to prior x-rays.  I would like to see her back in 3 months for repeat evaluation.   Follow-up: Return in about 3 months (around 08/06/2023). XR at next visit: AP pelvis and Right hip  Subjective:  Chief Complaint  Patient presents with   Routine Post Op    R hip DOS 02/11/23    History of Present Illness: Jody Wilson is a 72 y.o. female who presents following the above stated procedure.  Surgery was 3 months ago.  She continues to have some pain in the groin.  She has been very cognizant of posterior hip precautions.  She uses a walker.  She has not worked with therapy.  No issues with her incision.   Review of Systems: No fevers or chills No numbness or tingling No Chest Pain No shortness of breath   Objective: There were no vitals taken for this visit.  Physical Exam:  Alert and oriented.  No acute distress.  steady gait with use of a walker.  Lateral based hip incision is healing well.  No surrounding erythema or drainage.  No tenderness to palpation.  She is able to maintain a straight leg raise.  She tolerates axial loading.  She tolerates gentle range of motion of her hip.  Sensation intact over the dorsum of the right foot.  IMAGING: I personally ordered and reviewed the following images:  AP pelvis and right hip x-rays were obtained in clinic today.  These are compared to prior x-rays.  Hip is reduced.  No adverse features.  No shift in the overall alignment of the prosthesis.   No subsidence of the prosthesis.  There is no lucency around the prosthesis.  No acute injuries.  No bony lesions.  Impression: Right hip hemiarthroplasty in stable position without subsidence.   Oliver Barre, MD 05/06/2023 12:01 PM

## 2023-05-06 NOTE — Patient Instructions (Signed)
Physical therapy for your hip

## 2023-05-19 ENCOUNTER — Ambulatory Visit: Payer: Medicare Other | Attending: Orthopedic Surgery

## 2023-05-19 ENCOUNTER — Other Ambulatory Visit: Payer: Self-pay

## 2023-05-19 DIAGNOSIS — R262 Difficulty in walking, not elsewhere classified: Secondary | ICD-10-CM | POA: Insufficient documentation

## 2023-05-19 DIAGNOSIS — Z96649 Presence of unspecified artificial hip joint: Secondary | ICD-10-CM | POA: Insufficient documentation

## 2023-05-19 DIAGNOSIS — M25551 Pain in right hip: Secondary | ICD-10-CM | POA: Diagnosis not present

## 2023-05-19 DIAGNOSIS — M6281 Muscle weakness (generalized): Secondary | ICD-10-CM | POA: Insufficient documentation

## 2023-05-19 NOTE — Therapy (Signed)
OUTPATIENT PHYSICAL THERAPY LOWER EXTREMITY EVALUATION   Patient Name: Jody Wilson MRN: [id number] DOB:[birth year], 72 y.o., female Today's Date: 05/19/2023  END OF SESSION:  PT End of Session - 05/19/23 1034     Visit Number 1    Number of Visits 14    Date for PT Re-Evaluation 07/15/23    PT Start Time 1036    PT Stop Time 1116    PT Time Calculation (min) 40 min    Activity Tolerance Patient tolerated treatment well    Behavior During Therapy WFL for tasks assessed/performed             Past Medical History:  Diagnosis Date   Anemia    Anxiety    Aortic atherosclerosis (HCC)    Carotid artery disease (HCC)    Depression    GERD (gastroesophageal reflux disease)    Hyperlipidemia    Hypertension    Orthostatic hypotension    Osteoporosis    Past Surgical History:  Procedure Laterality Date   ABDOMINAL HYSTERECTOMY     BACK SURGERY     CHOLECYSTECTOMY     FOOT SURGERY     7 in the past   HIP ARTHROPLASTY Right 02/11/2023   Procedure: ARTHROPLASTY BIPOLAR HIP (HEMIARTHROPLASTY);  Surgeon: Oliver Barre, MD;  Location: [facility];  Service: Orthopedics;  Laterality: Right;   IR GENERIC HISTORICAL  12/09/2016   IR RADIOLOGIST EVAL & MGMT 12/09/2016 [facility]   IR GENERIC HISTORICAL  12/10/2016   IR VERTEBROPLASTY CERV/THOR BX INC UNI/BIL INC/INJECT/IMAGING 12/10/2016 [facility]   LEFT HEART CATH AND CORONARY ANGIOGRAPHY N/A 09/30/2020   Procedure: LEFT HEART CATH AND CORONARY ANGIOGRAPHY;  Surgeon: Elder Negus, MD;  Location: [facility];  Service: Cardiovascular;  Laterality: N/A;   TRACHEOSTOMY TUBE PLACEMENT  1982   Patient Active Problem List   Diagnosis Date Noted   Closed right hip fracture (HCC) 02/09/2023   Pulmonary nodule 02/09/2023   Electrolyte abnormality 02/09/2023   Hypertriglyceridemia 01/22/2022   Abnormal findings on diagnostic imaging of liver and biliary tract 12/09/2021   Diarrhea 12/09/2021   Personal history of  colonic polyps 12/09/2021   Weight loss 12/09/2021   Anxiety and depression 06/23/2020   Hiatal hernia 06/23/2020   Thoracic aortic atherosclerosis (HCC) 06/23/2020   Tobacco abuse 04/25/2020   Involuntary muscle jerks while sleeping 09/26/2019   B12 deficiency 09/25/2019   Iron deficiency anemia 09/25/2019   Elevated liver function tests 09/07/2019   Osteoporosis 02/09/2017   History of vertebral fracture 02/09/2017   GERD (gastroesophageal reflux disease) 03/11/2016   Hyperlipidemia with target LDL less than 100 02/19/2015   Hypertension 03/18/2014    PCP: Dettinger, Elige Radon, MD  REFERRING PROVIDER: Oliver Barre, MD   REFERRING DIAG: Status post hip hemiarthroplasty   THERAPY DIAG:  Pain in right hip  Muscle weakness (generalized)  Difficulty in walking, not elsewhere classified  Rationale for Evaluation and Treatment: Rehabilitation  ONSET DATE: 02/11/23  SUBJECTIVE:   SUBJECTIVE STATEMENT: Patient reports that she had a partial right hip replacement after she feel at home. She was sent to [facility] skilled nursing facility after surgery for about 2 weeks. She notes that this went well, but she was on oxycodone which really helped. However, since she has not taken prescription pain medication recently. She still has pain when putting weight on her right leg. She has to have help from her husband to get her shoes and socks on.   PERTINENT  HISTORY: Hypertension, osteoporosis, anxiety, depression, and orthostatic hypotension PAIN:  Are you having pain? Yes: NPRS scale: 7/10 Pain location: right groin and quadriceps Pain description: constant dull ache Aggravating factors: transfers, putting pressure on her right leg, sneezing, putting her shoes and socks on Relieving factors: medication  PRECAUTIONS: Fall  WEIGHT BEARING RESTRICTIONS: No  FALLS:  Has patient fallen in last 6 months? Yes. Number of falls 2  LIVING ENVIRONMENT: Lives with: lives with their  spouse Lives in: House/apartment Stairs: Yes: External: 2 steps; none Has following equipment at home: Single point cane  OCCUPATION: retired  PLOF: Independent  PATIENT GOALS: reduced pain, improved strength, be able to plant her garden, walk without an assistive device, and be able to do housework  NEXT MD VISIT: none scheduled yet   OBJECTIVE:   DIAGNOSTIC FINDINGS: 05/06/23 right hip x-ray   Impression: Right hip hemiarthroplasty in stable position without subsidence.   PATIENT SURVEYS:  FOTO to be completed on first follow-up  COGNITION: Overall cognitive status: Within functional limits for tasks assessed     SENSATION: Patient reports no numbness or tingling  EDEMA:  No edema observed  PALPATION: TTP: right hip flexors and hip adductors   LOWER EXTREMITY ROM:  Active ROM Right eval Left eval  Hip flexion 47/ 90 (PROM); limited by pain 107  Hip extension    Hip abduction    Hip adduction    Hip internal rotation    Hip external rotation    Knee flexion    Knee extension    Ankle dorsiflexion    Ankle plantarflexion    Ankle inversion    Ankle eversion     (Blank rows = not tested)  LOWER EXTREMITY MMT:  MMT Right eval Left eval  Hip flexion 3/5 4-/5  Hip extension    Hip abduction    Hip adduction    Hip internal rotation    Hip external rotation    Knee flexion 4/5 5/5  Knee extension 3+/5; familiar pain 5/5  Ankle dorsiflexion    Ankle plantarflexion    Ankle inversion    Ankle eversion     (Blank rows = not tested)  FUNCTIONAL TESTS:  Sit to supine: requires LLE to lift RLE   GAIT: Assistive device utilized: Single point cane Level of assistance: Modified independence Comments: Decreased stance time on right lower extremity, step to pattern, and decreased gait speed   TODAY'S TREATMENT:                                                                                                                              DATE:      PATIENT EDUCATION:  Education details: Healing, anatomy, prognosis, plan of care, and goals for therapy Person educated: Patient Education method: Explanation Education comprehension: verbalized understanding  HOME EXERCISE PROGRAM:   ASSESSMENT:  CLINICAL IMPRESSION: Patient is a 72 y.o. female who was seen today for physical therapy evaluation and treatment  following a right hip hemiarthroplasty on 02/11/23. She presented with moderate to high pain severity and irritability with palpation, active hip flexion, and manual muscle testing of the right quadriceps reproducing her familiar pain. She is at an elevated risk of falling due to her gait deviations and her history of falling. Recommend that she continue with skilled physical therapy to address her impairments to maximize her safety and functional mobility.  OBJECTIVE IMPAIRMENTS: Abnormal gait, decreased activity tolerance, decreased balance, decreased mobility, difficulty walking, decreased ROM, decreased strength, impaired tone, and pain.   ACTIVITY LIMITATIONS: carrying, lifting, standing, squatting, stairs, transfers, dressing, and locomotion level  PARTICIPATION LIMITATIONS: meal prep, cleaning, laundry, shopping, community activity, and yard work  PERSONAL FACTORS: Time since onset of injury/illness/exacerbation and 3+ comorbidities: Hypertension, osteoporosis, anxiety, depression, and orthostatic hypotension  are also affecting patient's functional outcome.   REHAB POTENTIAL: Good  CLINICAL DECISION MAKING: Evolving/moderate complexity  EVALUATION COMPLEXITY: Moderate   GOALS: Goals reviewed with patient? Yes  SHORT TERM GOALS: Target date: 06/09/23 Patient will be independent with her initial HEP. Baseline: Goal status: INITIAL  2.  Patient will be able to complete her daily activities without her familiar pain exceeding 5/10. Baseline:  Goal status: INITIAL  3.  Patient will be able to independently  transfer from sitting to supine for improved bed mobility. Baseline:  Goal status: INITIAL  4.  Patient will be able to demonstrate at least 60 degrees of active right hip flexion for improved hip mobility. Baseline:  Goal status: INITIAL  LONG TERM GOALS: Target date: 07/07/23  Patient will be independent with her advanced HEP. Baseline:  Goal status: INITIAL  2.  Patient will be able to complete her daily activities without her familiar pain exceeding 3/10. Baseline:  Goal status: INITIAL  3.  Patient will be able to safely squat and pick up items from the floor without assistance for improved function cleaning her house. Baseline:  Goal status: INITIAL  4.  Patient will be able to demonstrate at least 90 degrees of active right hip flexion for improved hip mobility. Baseline:  Goal status: INITIAL  5.  Patient will be able to safely ambulate at least 80 feet without an assistive device for improved household mobility. Baseline:  Goal status: INITIAL  6.  Patient will be able to transfer from sitting to standing without upper extremity support for improved functional mobility. Baseline:  Goal status: INITIAL   PLAN:  PT FREQUENCY: 2x/week  PT DURATION: other: 7 weeks  PLANNED INTERVENTIONS: Therapeutic exercises, Therapeutic activity, Neuromuscular re-education, Balance training, Gait training, Patient/Family education, Self Care, Joint mobilization, Stair training, Electrical stimulation, Cryotherapy, Moist heat, Vasopneumatic device, Manual therapy, and Re-evaluation  PLAN FOR NEXT SESSION: Nustep, hip isometrics, gait training, lower extremity strengthening, balance interventions, and modalities as needed    Granville Lewis, PT 05/19/2023, 6:08 PM

## 2023-05-23 ENCOUNTER — Ambulatory Visit: Payer: Medicare Other | Attending: Orthopedic Surgery

## 2023-05-23 DIAGNOSIS — M6281 Muscle weakness (generalized): Secondary | ICD-10-CM | POA: Insufficient documentation

## 2023-05-23 DIAGNOSIS — M25551 Pain in right hip: Secondary | ICD-10-CM | POA: Insufficient documentation

## 2023-05-23 DIAGNOSIS — R262 Difficulty in walking, not elsewhere classified: Secondary | ICD-10-CM | POA: Diagnosis not present

## 2023-05-23 NOTE — Therapy (Signed)
OUTPATIENT PHYSICAL THERAPY LOWER EXTREMITY TREATMENT   Patient Name: Jody Wilson MRN: [id number] DOB:05-26-1951, 72 y.o., female Today's Date: 05/23/2023  END OF SESSION:  PT End of Session - 05/23/23 1106     Visit Number 2    Number of Visits 14    Date for PT Re-Evaluation 07/15/23    PT Start Time 1100    PT Stop Time 1148    PT Time Calculation (min) 48 min    Activity Tolerance Patient tolerated treatment well    Behavior During Therapy WFL for tasks assessed/performed              Past Medical History:  Diagnosis Date   Anemia    Anxiety    Aortic atherosclerosis (HCC)    Carotid artery disease (HCC)    Depression    GERD (gastroesophageal reflux disease)    Hyperlipidemia    Hypertension    Orthostatic hypotension    Osteoporosis    Past Surgical History:  Procedure Laterality Date   ABDOMINAL HYSTERECTOMY     BACK SURGERY     CHOLECYSTECTOMY     FOOT SURGERY     7 in the past   HIP ARTHROPLASTY Right 02/11/2023   Procedure: ARTHROPLASTY BIPOLAR HIP (HEMIARTHROPLASTY);  Surgeon: Oliver Barre, MD;  Location: [facility];  Service: Orthopedics;  Laterality: Right;   IR GENERIC HISTORICAL  12/09/2016   IR RADIOLOGIST EVAL & MGMT 12/09/2016 [facility]   IR GENERIC HISTORICAL  12/10/2016   IR VERTEBROPLASTY CERV/THOR BX INC UNI/BIL INC/INJECT/IMAGING 12/10/2016 [facility]   LEFT HEART CATH AND CORONARY ANGIOGRAPHY N/A 09/30/2020   Procedure: LEFT HEART CATH AND CORONARY ANGIOGRAPHY;  Surgeon: Elder Negus, MD;  Location: [facility];  Service: Cardiovascular;  Laterality: N/A;   TRACHEOSTOMY TUBE PLACEMENT  1982   Patient Active Problem List   Diagnosis Date Noted   Closed right hip fracture (HCC) 02/09/2023   Pulmonary nodule 02/09/2023   Electrolyte abnormality 02/09/2023   Hypertriglyceridemia 01/22/2022   Abnormal findings on diagnostic imaging of liver and biliary tract 12/09/2021   Diarrhea 12/09/2021   Personal history of  colonic polyps 12/09/2021   Weight loss 12/09/2021   Anxiety and depression 06/23/2020   Hiatal hernia 06/23/2020   Thoracic aortic atherosclerosis (HCC) 06/23/2020   Tobacco abuse 04/25/2020   Involuntary muscle jerks while sleeping 09/26/2019   B12 deficiency 09/25/2019   Iron deficiency anemia 09/25/2019   Elevated liver function tests 09/07/2019   Osteoporosis 02/09/2017   History of vertebral fracture 02/09/2017   GERD (gastroesophageal reflux disease) 03/11/2016   Hyperlipidemia with target LDL less than 100 02/19/2015   Hypertension 03/18/2014    PCP: Dettinger, Elige Radon, MD  REFERRING PROVIDER: Oliver Barre, MD   REFERRING DIAG: Status post hip hemiarthroplasty   THERAPY DIAG:  Pain in right hip  Muscle weakness (generalized)  Difficulty in walking, not elsewhere classified  Rationale for Evaluation and Treatment: Rehabilitation  ONSET DATE: 02/11/23  SUBJECTIVE:   SUBJECTIVE STATEMENT: Patient reports that she is hurting a little today. She notes that she likes walking with her walker more than her cane as it does not hurt as much.   PERTINENT HISTORY: Hypertension, osteoporosis, anxiety, depression, and orthostatic hypotension PAIN:  Are you having pain? Yes: NPRS scale: 3/10 Pain location: right groin and quadriceps Pain description: constant dull ache Aggravating factors: transfers, putting pressure on her right leg, sneezing, putting her shoes and socks on Relieving factors: medication  PRECAUTIONS:  Fall  WEIGHT BEARING RESTRICTIONS: No  FALLS:  Has patient fallen in last 6 months? Yes. Number of falls 2  LIVING ENVIRONMENT: Lives with: lives with their spouse Lives in: House/apartment Stairs: Yes: External: 2 steps; none Has following equipment at home: Single point cane  OCCUPATION: retired  PLOF: Independent  PATIENT GOALS: reduced pain, improved strength, be able to plant her garden, walk without an assistive device, and be able to do  housework  NEXT MD VISIT: none scheduled yet   OBJECTIVE:   DIAGNOSTIC FINDINGS: 05/06/23 right hip x-ray   Impression: Right hip hemiarthroplasty in stable position without subsidence.   PATIENT SURVEYS:  FOTO 28.88 on 05/23/23  COGNITION: Overall cognitive status: Within functional limits for tasks assessed     SENSATION: Patient reports no numbness or tingling  EDEMA:  No edema observed  PALPATION: TTP: right hip flexors and hip adductors   LOWER EXTREMITY ROM:  Active ROM Right eval Left eval  Hip flexion 47/ 90 (PROM); limited by pain 107  Hip extension    Hip abduction    Hip adduction    Hip internal rotation    Hip external rotation    Knee flexion    Knee extension    Ankle dorsiflexion    Ankle plantarflexion    Ankle inversion    Ankle eversion     (Blank rows = not tested)  LOWER EXTREMITY MMT:  MMT Right eval Left eval  Hip flexion 3/5 4-/5  Hip extension    Hip abduction    Hip adduction    Hip internal rotation    Hip external rotation    Knee flexion 4/5 5/5  Knee extension 3+/5; familiar pain 5/5  Ankle dorsiflexion    Ankle plantarflexion    Ankle inversion    Ankle eversion     (Blank rows = not tested)  FUNCTIONAL TESTS:  Sit to supine: requires LLE to lift RLE   GAIT: Assistive device utilized: Single point cane Level of assistance: Modified independence Comments: Decreased stance time on right lower extremity, step to pattern, and decreased gait speed   TODAY'S TREATMENT:                                                                                                                              DATE:                                     05/23/23 EXERCISE LOG  Exercise Repetitions and Resistance Comments  Nustep  L3 x 16 minutes   Standing heel/toe raises  2 minutes   Standing side stepping 2 minutes  Alternating LE  Seated hip ADD isometric 3 minutes w/ 5 seconds   LAQ 2.5 minutes    Seated clams 2 minutes    Seated  march  2 minutes    Blank cell = exercise not  performed today    PATIENT EDUCATION:  Education details: Healing, anatomy, prognosis, plan of care, and goals for therapy Person educated: Patient Education method: Explanation Education comprehension: verbalized understanding  HOME EXERCISE PROGRAM: [email or web address]  ASSESSMENT:  CLINICAL IMPRESSION: Patient was introduced to multiple new interventions with moderate difficulty. She required minimal cueing with today's new interventions for proper exercise performance to facilitate improved lower extremity strength without aggravating her familiar symptoms. She experienced a mild increase in right hip "soreness" with seated clams and marching, but this did not inhibit her ability to complete any of today's interventions. Her HEP was updated with today's interventions. She reported feeling comfortable with these interventions. She reported feeling alright upon the conclusion of treatment. She continues to require skilled physical therapy to address her remaining impairments to return to her prior level of function.   OBJECTIVE IMPAIRMENTS: Abnormal gait, decreased activity tolerance, decreased balance, decreased mobility, difficulty walking, decreased ROM, decreased strength, impaired tone, and pain.   ACTIVITY LIMITATIONS: carrying, lifting, standing, squatting, stairs, transfers, dressing, and locomotion level  PARTICIPATION LIMITATIONS: meal prep, cleaning, laundry, shopping, community activity, and yard work  PERSONAL FACTORS: Time since onset of injury/illness/exacerbation and 3+ comorbidities: Hypertension, osteoporosis, anxiety, depression, and orthostatic hypotension  are also affecting patient's functional outcome.   REHAB POTENTIAL: Good  CLINICAL DECISION MAKING: Evolving/moderate complexity  EVALUATION COMPLEXITY: Moderate   GOALS: Goals reviewed with patient? Yes  SHORT TERM GOALS: Target date: 06/09/23 Patient will be independent  with her initial HEP. Baseline: Goal status: INITIAL  2.  Patient will be able to complete her daily activities without her familiar pain exceeding 5/10. Baseline:  Goal status: INITIAL  3.  Patient will be able to independently transfer from sitting to supine for improved bed mobility. Baseline:  Goal status: INITIAL  4.  Patient will be able to demonstrate at least 60 degrees of active right hip flexion for improved hip mobility. Baseline:  Goal status: INITIAL  LONG TERM GOALS: Target date: 07/07/23  Patient will be independent with her advanced HEP. Baseline:  Goal status: INITIAL  2.  Patient will be able to complete her daily activities without her familiar pain exceeding 3/10. Baseline:  Goal status: INITIAL  3.  Patient will be able to safely squat and pick up items from the floor without assistance for improved function cleaning her house. Baseline:  Goal status: INITIAL  4.  Patient will be able to demonstrate at least 90 degrees of active right hip flexion for improved hip mobility. Baseline:  Goal status: INITIAL  5.  Patient will be able to safely ambulate at least 80 feet without an assistive device for improved household mobility. Baseline:  Goal status: INITIAL  6.  Patient will be able to transfer from sitting to standing without upper extremity support for improved functional mobility. Baseline:  Goal status: INITIAL   PLAN:  PT FREQUENCY: 2x/week  PT DURATION: other: 7 weeks  PLANNED INTERVENTIONS: Therapeutic exercises, Therapeutic activity, Neuromuscular re-education, Balance training, Gait training, Patient/Family education, Self Care, Joint mobilization, Stair training, Electrical stimulation, Cryotherapy, Moist heat, Vasopneumatic device, Manual therapy, and Re-evaluation  PLAN FOR NEXT SESSION: Nustep, hip isometrics, gait training, lower extremity strengthening, balance interventions, and modalities as needed    Granville Lewis,  PT 05/23/2023, 12:13 PM

## 2023-05-30 ENCOUNTER — Ambulatory Visit: Payer: Medicare Other

## 2023-05-30 DIAGNOSIS — M6281 Muscle weakness (generalized): Secondary | ICD-10-CM | POA: Diagnosis not present

## 2023-05-30 DIAGNOSIS — R262 Difficulty in walking, not elsewhere classified: Secondary | ICD-10-CM

## 2023-05-30 DIAGNOSIS — M25551 Pain in right hip: Secondary | ICD-10-CM

## 2023-05-30 NOTE — Therapy (Signed)
OUTPATIENT PHYSICAL THERAPY LOWER EXTREMITY TREATMENT   Patient Name: Jody Wilson MRN: [id number] DOB:05-Dec-1951, 72 y.o., female Today's Date: 05/30/2023  END OF SESSION:  PT End of Session - 05/30/23 1057     Visit Number 3    Number of Visits 14    Date for PT Re-Evaluation 07/15/23    PT Start Time 1100    PT Stop Time 1145    PT Time Calculation (min) 45 min    Activity Tolerance Patient tolerated treatment well    Behavior During Therapy WFL for tasks assessed/performed              Past Medical History:  Diagnosis Date   Anemia    Anxiety    Aortic atherosclerosis (HCC)    Carotid artery disease (HCC)    Depression    GERD (gastroesophageal reflux disease)    Hyperlipidemia    Hypertension    Orthostatic hypotension    Osteoporosis    Past Surgical History:  Procedure Laterality Date   ABDOMINAL HYSTERECTOMY     BACK SURGERY     CHOLECYSTECTOMY     FOOT SURGERY     7 in the past   HIP ARTHROPLASTY Right 02/11/2023   Procedure: ARTHROPLASTY BIPOLAR HIP (HEMIARTHROPLASTY);  Surgeon: Oliver Barre, MD;  Location: [facility];  Service: Orthopedics;  Laterality: Right;   IR GENERIC HISTORICAL  12/09/2016   IR RADIOLOGIST EVAL & MGMT 12/09/2016 [facility]   IR GENERIC HISTORICAL  12/10/2016   IR VERTEBROPLASTY CERV/THOR BX INC UNI/BIL INC/INJECT/IMAGING 12/10/2016 [facility]   LEFT HEART CATH AND CORONARY ANGIOGRAPHY N/A 09/30/2020   Procedure: LEFT HEART CATH AND CORONARY ANGIOGRAPHY;  Surgeon: Elder Negus, MD;  Location: [facility];  Service: Cardiovascular;  Laterality: N/A;   TRACHEOSTOMY TUBE PLACEMENT  1982   Patient Active Problem List   Diagnosis Date Noted   Closed right hip fracture (HCC) 02/09/2023   Pulmonary nodule 02/09/2023   Electrolyte abnormality 02/09/2023   Hypertriglyceridemia 01/22/2022   Abnormal findings on diagnostic imaging of liver and biliary tract 12/09/2021   Diarrhea 12/09/2021   Personal history  of colonic polyps 12/09/2021   Weight loss 12/09/2021   Anxiety and depression 06/23/2020   Hiatal hernia 06/23/2020   Thoracic aortic atherosclerosis (HCC) 06/23/2020   Tobacco abuse 04/25/2020   Involuntary muscle jerks while sleeping 09/26/2019   B12 deficiency 09/25/2019   Iron deficiency anemia 09/25/2019   Elevated liver function tests 09/07/2019   Osteoporosis 02/09/2017   History of vertebral fracture 02/09/2017   GERD (gastroesophageal reflux disease) 03/11/2016   Hyperlipidemia with target LDL less than 100 02/19/2015   Hypertension 03/18/2014    PCP: Dettinger, Elige Radon, MD  REFERRING PROVIDER: Oliver Barre, MD   REFERRING DIAG: Status post hip hemiarthroplasty   THERAPY DIAG:  Pain in right hip  Muscle weakness (generalized)  Difficulty in walking, not elsewhere classified  Rationale for Evaluation and Treatment: Rehabilitation  ONSET DATE: 02/11/23  SUBJECTIVE:   SUBJECTIVE STATEMENT: Patient reports that she was sick last week and was unable to do her exercises on Thursday or Friday. She notes that she has been sore since starting her exercises back on Saturday. She notes that she felt better than she thought after her last appointment.   PERTINENT HISTORY: Hypertension, osteoporosis, anxiety, depression, and orthostatic hypotension PAIN:  Are you having pain? Yes: NPRS scale: 5/10 Pain location: right groin and quadriceps Pain description: constant dull ache Aggravating factors: transfers, putting  pressure on her right leg, sneezing, putting her shoes and socks on Relieving factors: medication  PRECAUTIONS: Fall  WEIGHT BEARING RESTRICTIONS: No  FALLS:  Has patient fallen in last 6 months? Yes. Number of falls 2  LIVING ENVIRONMENT: Lives with: lives with their spouse Lives in: House/apartment Stairs: Yes: External: 2 steps; none Has following equipment at home: Single point cane  OCCUPATION: retired  PLOF: Independent  PATIENT GOALS:  reduced pain, improved strength, be able to plant her garden, walk without an assistive device, and be able to do housework  NEXT MD VISIT: none scheduled yet   OBJECTIVE:   DIAGNOSTIC FINDINGS: 05/06/23 right hip x-ray   Impression: Right hip hemiarthroplasty in stable position without subsidence.   PATIENT SURVEYS:  FOTO 28.88 on 05/23/23  COGNITION: Overall cognitive status: Within functional limits for tasks assessed     SENSATION: Patient reports no numbness or tingling  EDEMA:  No edema observed  PALPATION: TTP: right hip flexors and hip adductors   LOWER EXTREMITY ROM:  Active ROM Right eval Left eval  Hip flexion 47/ 90 (PROM); limited by pain 107  Hip extension    Hip abduction    Hip adduction    Hip internal rotation    Hip external rotation    Knee flexion    Knee extension    Ankle dorsiflexion    Ankle plantarflexion    Ankle inversion    Ankle eversion     (Blank rows = not tested)  LOWER EXTREMITY MMT:  MMT Right eval Left eval  Hip flexion 3/5 4-/5  Hip extension    Hip abduction    Hip adduction    Hip internal rotation    Hip external rotation    Knee flexion 4/5 5/5  Knee extension 3+/5; familiar pain 5/5  Ankle dorsiflexion    Ankle plantarflexion    Ankle inversion    Ankle eversion     (Blank rows = not tested)  FUNCTIONAL TESTS:  Sit to supine: requires LLE to lift RLE   GAIT: Assistive device utilized: Single point cane Level of assistance: Modified independence Comments: Decreased stance time on right lower extremity, step to pattern, and decreased gait speed   TODAY'S TREATMENT:                                                                                                                              DATE:                                     05/30/23 EXERCISE LOG  Exercise Repetitions and Resistance Comments  Nustep  L3 x 17 minutes    Step up  4" step x 20 reps each    Standing heel/toe raise  3 minutes BUE  support  Standing HS curl  3 minutes BUE support  Standing HS stretch  2 minutes  RLE on 6' step   Toe tap onto 6" step  2 minutes     Blank cell = exercise not performed today                                    05/23/23 EXERCISE LOG  Exercise Repetitions and Resistance Comments  Nustep  L3 x 16 minutes   Standing heel/toe raises  2 minutes   Standing side stepping 2 minutes  Alternating LE  Seated hip ADD isometric 3 minutes w/ 5 seconds   LAQ 2.5 minutes    Seated clams 2 minutes    Seated march  2 minutes    Blank cell = exercise not performed today    PATIENT EDUCATION:  Education details: Healing, anatomy, prognosis, plan of care, and goals for therapy Person educated: Patient Education method: Explanation Education comprehension: verbalized understanding  HOME EXERCISE PROGRAM: [email or web address]  ASSESSMENT:  CLINICAL IMPRESSION: Treatment focused on standing interventions for improved function with her daily activities. She required minimal cueing with step ups to prevent right hip circumduction to isolate right hip flexion. She experienced a mild increase in right hip soreness with standing hamstring curls, but this was able to be significantly reduced with a standing hamstring stretch. She reported feeling better upon the conclusion of treatment. She continues to require skilled physical therapy to address her remaining impairments to return to her prior level of function.   OBJECTIVE IMPAIRMENTS: Abnormal gait, decreased activity tolerance, decreased balance, decreased mobility, difficulty walking, decreased ROM, decreased strength, impaired tone, and pain.   ACTIVITY LIMITATIONS: carrying, lifting, standing, squatting, stairs, transfers, dressing, and locomotion level  PARTICIPATION LIMITATIONS: meal prep, cleaning, laundry, shopping, community activity, and yard work  PERSONAL FACTORS: Time since onset of injury/illness/exacerbation and 3+ comorbidities: Hypertension,  osteoporosis, anxiety, depression, and orthostatic hypotension  are also affecting patient's functional outcome.   REHAB POTENTIAL: Good  CLINICAL DECISION MAKING: Evolving/moderate complexity  EVALUATION COMPLEXITY: Moderate   GOALS: Goals reviewed with patient? Yes  SHORT TERM GOALS: Target date: 06/09/23 Patient will be independent with her initial HEP. Baseline: Goal status: INITIAL  2.  Patient will be able to complete her daily activities without her familiar pain exceeding 5/10. Baseline:  Goal status: INITIAL  3.  Patient will be able to independently transfer from sitting to supine for improved bed mobility. Baseline:  Goal status: INITIAL  4.  Patient will be able to demonstrate at least 60 degrees of active right hip flexion for improved hip mobility. Baseline:  Goal status: INITIAL  LONG TERM GOALS: Target date: 07/07/23  Patient will be independent with her advanced HEP. Baseline:  Goal status: INITIAL  2.  Patient will be able to complete her daily activities without her familiar pain exceeding 3/10. Baseline:  Goal status: INITIAL  3.  Patient will be able to safely squat and pick up items from the floor without assistance for improved function cleaning her house. Baseline:  Goal status: INITIAL  4.  Patient will be able to demonstrate at least 90 degrees of active right hip flexion for improved hip mobility. Baseline:  Goal status: INITIAL  5.  Patient will be able to safely ambulate at least 80 feet without an assistive device for improved household mobility. Baseline:  Goal status: INITIAL  6.  Patient will be able to transfer from sitting to standing without upper extremity support for improved functional mobility. Baseline:  Goal status:  INITIAL   PLAN:  PT FREQUENCY: 2x/week  PT DURATION: other: 7 weeks  PLANNED INTERVENTIONS: Therapeutic exercises, Therapeutic activity, Neuromuscular re-education, Balance training, Gait training,  Patient/Family education, Self Care, Joint mobilization, Stair training, Electrical stimulation, Cryotherapy, Moist heat, Vasopneumatic device, Manual therapy, and Re-evaluation  PLAN FOR NEXT SESSION: Nustep, hip isometrics, gait training, lower extremity strengthening, balance interventions, and modalities as needed    Granville Lewis, PT 05/30/2023, 12:18 PM

## 2023-06-02 ENCOUNTER — Ambulatory Visit: Payer: Medicare Other

## 2023-06-02 DIAGNOSIS — M25551 Pain in right hip: Secondary | ICD-10-CM

## 2023-06-02 DIAGNOSIS — R262 Difficulty in walking, not elsewhere classified: Secondary | ICD-10-CM

## 2023-06-02 DIAGNOSIS — M6281 Muscle weakness (generalized): Secondary | ICD-10-CM | POA: Diagnosis not present

## 2023-06-02 NOTE — Therapy (Signed)
OUTPATIENT PHYSICAL THERAPY LOWER EXTREMITY TREATMENT   Patient Name: Jody Wilson MRN: [id number] DOB:[birth year], 72 y.o., female Today's Date: 06/02/2023  END OF SESSION:  PT End of Session - 06/02/23 1303     Visit Number 4    Number of Visits 14    Date for PT Re-Evaluation 07/15/23    PT Start Time 1300    PT Stop Time 1345    PT Time Calculation (min) 45 min    Activity Tolerance Patient tolerated treatment well    Behavior During Therapy WFL for tasks assessed/performed              Past Medical History:  Diagnosis Date   Anemia    Anxiety    Aortic atherosclerosis (HCC)    Carotid artery disease (HCC)    Depression    GERD (gastroesophageal reflux disease)    Hyperlipidemia    Hypertension    Orthostatic hypotension    Osteoporosis    Past Surgical History:  Procedure Laterality Date   ABDOMINAL HYSTERECTOMY     BACK SURGERY     CHOLECYSTECTOMY     FOOT SURGERY     7 in the past   HIP ARTHROPLASTY Right 02/11/2023   Procedure: ARTHROPLASTY BIPOLAR HIP (HEMIARTHROPLASTY);  Surgeon: Oliver Barre, MD;  Location: [facility];  Service: Orthopedics;  Laterality: Right;   IR GENERIC HISTORICAL  12/09/2016   IR RADIOLOGIST EVAL & MGMT 12/09/2016 [facility]   IR GENERIC HISTORICAL  12/10/2016   IR VERTEBROPLASTY CERV/THOR BX INC UNI/BIL INC/INJECT/IMAGING 12/10/2016 [facility]   LEFT HEART CATH AND CORONARY ANGIOGRAPHY N/A 09/30/2020   Procedure: LEFT HEART CATH AND CORONARY ANGIOGRAPHY;  Surgeon: Elder Negus, MD;  Location: [facility];  Service: Cardiovascular;  Laterality: N/A;   TRACHEOSTOMY TUBE PLACEMENT  1982   Patient Active Problem List   Diagnosis Date Noted   Closed right hip fracture (HCC) 02/09/2023   Pulmonary nodule 02/09/2023   Electrolyte abnormality 02/09/2023   Hypertriglyceridemia 01/22/2022   Abnormal findings on diagnostic imaging of liver and biliary tract 12/09/2021   Diarrhea 12/09/2021   Personal history  of colonic polyps 12/09/2021   Weight loss 12/09/2021   Anxiety and depression 06/23/2020   Hiatal hernia 06/23/2020   Thoracic aortic atherosclerosis (HCC) 06/23/2020   Tobacco abuse 04/25/2020   Involuntary muscle jerks while sleeping 09/26/2019   B12 deficiency 09/25/2019   Iron deficiency anemia 09/25/2019   Elevated liver function tests 09/07/2019   Osteoporosis 02/09/2017   History of vertebral fracture 02/09/2017   GERD (gastroesophageal reflux disease) 03/11/2016   Hyperlipidemia with target LDL less than 100 02/19/2015   Hypertension 03/18/2014    PCP: Dettinger, Elige Radon, MD  REFERRING PROVIDER: Oliver Barre, MD   REFERRING DIAG: Status post hip hemiarthroplasty   THERAPY DIAG:  Pain in right hip  Muscle weakness (generalized)  Difficulty in walking, not elsewhere classified  Rationale for Evaluation and Treatment: Rehabilitation  ONSET DATE: 02/11/23  SUBJECTIVE:   SUBJECTIVE STATEMENT: Pt reports minimal pain today.  PERTINENT HISTORY: Hypertension, osteoporosis, anxiety, depression, and orthostatic hypotension PAIN:  Are you having pain? Yes: NPRS scale: 1/10 Pain location: right groin and quadriceps Pain description: constant dull ache Aggravating factors: transfers, putting pressure on her right leg, sneezing, putting her shoes and socks on Relieving factors: medication  PRECAUTIONS: Fall  WEIGHT BEARING RESTRICTIONS: No  FALLS:  Has patient fallen in last 6 months? Yes. Number of falls 2  LIVING ENVIRONMENT: Lives  with: lives with their spouse Lives in: House/apartment Stairs: Yes: External: 2 steps; none Has following equipment at home: Single point cane  OCCUPATION: retired  PLOF: Independent  PATIENT GOALS: reduced pain, improved strength, be able to plant her garden, walk without an assistive device, and be able to do housework  NEXT MD VISIT: none scheduled yet   OBJECTIVE:   DIAGNOSTIC FINDINGS: 05/06/23 right hip x-ray    Impression: Right hip hemiarthroplasty in stable position without subsidence.   PATIENT SURVEYS:  FOTO 28.88 on 05/23/23  COGNITION: Overall cognitive status: Within functional limits for tasks assessed     SENSATION: Patient reports no numbness or tingling  EDEMA:  No edema observed  PALPATION: TTP: right hip flexors and hip adductors   LOWER EXTREMITY ROM:  Active ROM Right eval Left eval  Hip flexion 47/ 90 (PROM); limited by pain 107  Hip extension    Hip abduction    Hip adduction    Hip internal rotation    Hip external rotation    Knee flexion    Knee extension    Ankle dorsiflexion    Ankle plantarflexion    Ankle inversion    Ankle eversion     (Blank rows = not tested)  LOWER EXTREMITY MMT:  MMT Right eval Left eval  Hip flexion 3/5 4-/5  Hip extension    Hip abduction    Hip adduction    Hip internal rotation    Hip external rotation    Knee flexion 4/5 5/5  Knee extension 3+/5; familiar pain 5/5  Ankle dorsiflexion    Ankle plantarflexion    Ankle inversion    Ankle eversion     (Blank rows = not tested)  FUNCTIONAL TESTS:  Sit to supine: requires LLE to lift RLE   GAIT: Assistive device utilized: Single point cane Level of assistance: Modified independence Comments: Decreased stance time on right lower extremity, step to pattern, and decreased gait speed   TODAY'S TREATMENT:                                                                                                                              DATE:                                     06/02/23 EXERCISE LOG  Exercise Repetitions and Resistance Comments  Nustep  L3 x 17 minutes    Step up  6" step x 20 reps each    Standing heel/toe raise  3 minutes BUE support  Standing HS curl  3 minutes BUE support  Standing HS stretch  2 minutes  RLE on 6' step   Rockerboard 3 mins   Gait  50 ft with walking stick (heel toe pattern)   LAQ 3# x 25 reps bil   Seated Marches 3# x 25 reps  bil  Blank cell = exercise not performed today                                 PATIENT EDUCATION:  Education details: Healing, anatomy, prognosis, plan of care, and goals for therapy Person educated: Patient Education method: Explanation Education comprehension: verbalized understanding  HOME EXERCISE PROGRAM: [email or web address]  ASSESSMENT:  CLINICAL IMPRESSION: Pt arrives for today's treatment session reporting 1/10 right hip pain. Pt able to tolerate increased time with all previously performed exercises.  Pt able to tolerate introduction to seated exercises today without discomfort.  Gait training at end of session focused on heel-toe pattern with good heel strike on right LE.  Pt's gait slow, but looks much better with correct pattern. Pt denied any pain at completion of today's treatment session.  OBJECTIVE IMPAIRMENTS: Abnormal gait, decreased activity tolerance, decreased balance, decreased mobility, difficulty walking, decreased ROM, decreased strength, impaired tone, and pain.   ACTIVITY LIMITATIONS: carrying, lifting, standing, squatting, stairs, transfers, dressing, and locomotion level  PARTICIPATION LIMITATIONS: meal prep, cleaning, laundry, shopping, community activity, and yard work  PERSONAL FACTORS: Time since onset of injury/illness/exacerbation and 3+ comorbidities: Hypertension, osteoporosis, anxiety, depression, and orthostatic hypotension  are also affecting patient's functional outcome.   REHAB POTENTIAL: Good  CLINICAL DECISION MAKING: Evolving/moderate complexity  EVALUATION COMPLEXITY: Moderate   GOALS: Goals reviewed with patient? Yes  SHORT TERM GOALS: Target date: 06/09/23 Patient will be independent with her initial HEP. Baseline: Goal status: INITIAL  2.  Patient will be able to complete her daily activities without her familiar pain exceeding 5/10. Baseline:  Goal status: INITIAL  3.  Patient will be able to independently transfer from sitting to  supine for improved bed mobility. Baseline:  Goal status: INITIAL  4.  Patient will be able to demonstrate at least 60 degrees of active right hip flexion for improved hip mobility. Baseline:  Goal status: INITIAL  LONG TERM GOALS: Target date: 07/07/23  Patient will be independent with her advanced HEP. Baseline:  Goal status: INITIAL  2.  Patient will be able to complete her daily activities without her familiar pain exceeding 3/10. Baseline:  Goal status: INITIAL  3.  Patient will be able to safely squat and pick up items from the floor without assistance for improved function cleaning her house. Baseline:  Goal status: INITIAL  4.  Patient will be able to demonstrate at least 90 degrees of active right hip flexion for improved hip mobility. Baseline:  Goal status: INITIAL  5.  Patient will be able to safely ambulate at least 80 feet without an assistive device for improved household mobility. Baseline:  Goal status: INITIAL  6.  Patient will be able to transfer from sitting to standing without upper extremity support for improved functional mobility. Baseline:  Goal status: INITIAL   PLAN:  PT FREQUENCY: 2x/week  PT DURATION: other: 7 weeks  PLANNED INTERVENTIONS: Therapeutic exercises, Therapeutic activity, Neuromuscular re-education, Balance training, Gait training, Patient/Family education, Self Care, Joint mobilization, Stair training, Electrical stimulation, Cryotherapy, Moist heat, Vasopneumatic device, Manual therapy, and Re-evaluation  PLAN FOR NEXT SESSION: Nustep, hip isometrics, gait training, lower extremity strengthening, balance interventions, and modalities as needed    Newman Pies, PTA 06/02/2023, 2:21 PM

## 2023-06-07 ENCOUNTER — Ambulatory Visit: Payer: Medicare Other | Admitting: Physical Therapy

## 2023-06-07 DIAGNOSIS — R262 Difficulty in walking, not elsewhere classified: Secondary | ICD-10-CM | POA: Diagnosis not present

## 2023-06-07 DIAGNOSIS — M25551 Pain in right hip: Secondary | ICD-10-CM | POA: Diagnosis not present

## 2023-06-07 DIAGNOSIS — M6281 Muscle weakness (generalized): Secondary | ICD-10-CM

## 2023-06-07 NOTE — Therapy (Signed)
OUTPATIENT PHYSICAL THERAPY LOWER EXTREMITY TREATMENT   Patient Name: Jody Wilson MRN: [id number] DOB:Nov 22, 1951, 72 y.o., female Today's Date: 06/07/2023  END OF SESSION:  PT End of Session - 06/07/23 1610     Visit Number 5    Number of Visits 14    Date for PT Re-Evaluation 07/15/23    PT Start Time 0315    PT Stop Time 0354    PT Time Calculation (min) 39 min    Activity Tolerance Patient tolerated treatment well    Behavior During Therapy WFL for tasks assessed/performed              Past Medical History:  Diagnosis Date   Anemia    Anxiety    Aortic atherosclerosis (HCC)    Carotid artery disease (HCC)    Depression    GERD (gastroesophageal reflux disease)    Hyperlipidemia    Hypertension    Orthostatic hypotension    Osteoporosis    Past Surgical History:  Procedure Laterality Date   ABDOMINAL HYSTERECTOMY     BACK SURGERY     CHOLECYSTECTOMY     FOOT SURGERY     7 in the past   HIP ARTHROPLASTY Right 02/11/2023   Procedure: ARTHROPLASTY BIPOLAR HIP (HEMIARTHROPLASTY);  Surgeon: Oliver Barre, MD;  Location: [facility];  Service: Orthopedics;  Laterality: Right;   IR GENERIC HISTORICAL  12/09/2016   IR RADIOLOGIST EVAL & MGMT 12/09/2016 [facility]   IR GENERIC HISTORICAL  12/10/2016   IR VERTEBROPLASTY CERV/THOR BX INC UNI/BIL INC/INJECT/IMAGING 12/10/2016 [facility]   LEFT HEART CATH AND CORONARY ANGIOGRAPHY N/A 09/30/2020   Procedure: LEFT HEART CATH AND CORONARY ANGIOGRAPHY;  Surgeon: Elder Negus, MD;  Location: [facility];  Service: Cardiovascular;  Laterality: N/A;   TRACHEOSTOMY TUBE PLACEMENT  1982   Patient Active Problem List   Diagnosis Date Noted   Closed right hip fracture (HCC) 02/09/2023   Pulmonary nodule 02/09/2023   Electrolyte abnormality 02/09/2023   Hypertriglyceridemia 01/22/2022   Abnormal findings on diagnostic imaging of liver and biliary tract 12/09/2021   Diarrhea 12/09/2021   Personal history  of colonic polyps 12/09/2021   Weight loss 12/09/2021   Anxiety and depression 06/23/2020   Hiatal hernia 06/23/2020   Thoracic aortic atherosclerosis (HCC) 06/23/2020   Tobacco abuse 04/25/2020   Involuntary muscle jerks while sleeping 09/26/2019   B12 deficiency 09/25/2019   Iron deficiency anemia 09/25/2019   Elevated liver function tests 09/07/2019   Osteoporosis 02/09/2017   History of vertebral fracture 02/09/2017   GERD (gastroesophageal reflux disease) 03/11/2016   Hyperlipidemia with target LDL less than 100 02/19/2015   Hypertension 03/18/2014    PCP: Dettinger, Elige Radon, MD  REFERRING PROVIDER: Oliver Barre, MD   REFERRING DIAG: Status post hip hemiarthroplasty   THERAPY DIAG:  No diagnosis found.  Rationale for Evaluation and Treatment: Rehabilitation  ONSET DATE: 02/11/23  SUBJECTIVE:   SUBJECTIVE STATEMENT: No pain but "sore" level is a 12.  No c/o right groin pain.  PERTINENT HISTORY: Hypertension, osteoporosis, anxiety, depression, and orthostatic hypotension PAIN:  Are you having pain? Yes: NPRS scale:  /10 Pain location: right groin and quadriceps Pain description: constant dull ache Aggravating factors: transfers, putting pressure on her right leg, sneezing, putting her shoes and socks on Relieving factors: medication  PRECAUTIONS: Fall  WEIGHT BEARING RESTRICTIONS: No  FALLS:  Has patient fallen in last 6 months? Yes. Number of falls 2  LIVING ENVIRONMENT: Lives with: lives  with their spouse Lives in: House/apartment Stairs: Yes: External: 2 steps; none Has following equipment at home: Single point cane  OCCUPATION: retired  PLOF: Independent  PATIENT GOALS: reduced pain, improved strength, be able to plant her garden, walk without an assistive device, and be able to do housework  NEXT MD VISIT: none scheduled yet   OBJECTIVE:   DIAGNOSTIC FINDINGS: 05/06/23 right hip x-ray   Impression: Right hip hemiarthroplasty in stable  position without subsidence.   PATIENT SURVEYS:  FOTO 28.88 on 05/23/23  COGNITION: Overall cognitive status: Within functional limits for tasks assessed     SENSATION: Patient reports no numbness or tingling  EDEMA:  No edema observed  PALPATION: TTP: right hip flexors and hip adductors   LOWER EXTREMITY ROM:  Active ROM Right eval Left eval  Hip flexion 47/ 90 (PROM); limited by pain 107  Hip extension    Hip abduction    Hip adduction    Hip internal rotation    Hip external rotation    Knee flexion    Knee extension    Ankle dorsiflexion    Ankle plantarflexion    Ankle inversion    Ankle eversion     (Blank rows = not tested)  LOWER EXTREMITY MMT:  MMT Right eval Left eval  Hip flexion 3/5 4-/5  Hip extension    Hip abduction    Hip adduction    Hip internal rotation    Hip external rotation    Knee flexion 4/5 5/5  Knee extension 3+/5; familiar pain 5/5  Ankle dorsiflexion    Ankle plantarflexion    Ankle inversion    Ankle eversion     (Blank rows = not tested)  FUNCTIONAL TESTS:  Sit to supine: requires LLE to lift RLE   GAIT: Assistive device utilized: Single point cane Level of assistance: Modified independence Comments: Decreased stance time on right lower extremity, step to pattern, and decreased gait speed   TODAY'S TREATMENT:                                                                                                                              DATE:                                     06/02/23 EXERCISE LOG  Exercise Repetitions and Resistance Comments  Nustep  L3 x 20 minutes    In parallel bars:  Rockerboard x 4 minutes, Airex balance pad x 4 minutes and foam balance beam x 2 minutes.                                 PATIENT EDUCATION:  Education details: Healing, anatomy, prognosis, plan of care, and goals for therapy Person educated: Patient Education method: Explanation Education comprehension: verbalized  understanding  HOME  EXERCISE PROGRAM: [email or web address]  ASSESSMENT:  CLINICAL IMPRESSION: Patient sore from last treatment but reporting no right hip or groin pain.  She did very well with balance/Neuro-re-edu activities.  She is walking with very good heel strike and performed a non-reciprocating stair gait easily with one railing.  OBJECTIVE IMPAIRMENTS: Abnormal gait, decreased activity tolerance, decreased balance, decreased mobility, difficulty walking, decreased ROM, decreased strength, impaired tone, and pain.   ACTIVITY LIMITATIONS: carrying, lifting, standing, squatting, stairs, transfers, dressing, and locomotion level  PARTICIPATION LIMITATIONS: meal prep, cleaning, laundry, shopping, community activity, and yard work  PERSONAL FACTORS: Time since onset of injury/illness/exacerbation and 3+ comorbidities: Hypertension, osteoporosis, anxiety, depression, and orthostatic hypotension  are also affecting patient's functional outcome.   REHAB POTENTIAL: Good  CLINICAL DECISION MAKING: Evolving/moderate complexity  EVALUATION COMPLEXITY: Moderate   GOALS: Goals reviewed with patient? Yes  SHORT TERM GOALS: Target date: 06/09/23 Patient will be independent with her initial HEP. Baseline: Goal status: INITIAL  2.  Patient will be able to complete her daily activities without her familiar pain exceeding 5/10. Baseline:  Goal status: INITIAL  3.  Patient will be able to independently transfer from sitting to supine for improved bed mobility. Baseline:  Goal status: INITIAL  4.  Patient will be able to demonstrate at least 60 degrees of active right hip flexion for improved hip mobility. Baseline:  Goal status: INITIAL  LONG TERM GOALS: Target date: 07/07/23  Patient will be independent with her advanced HEP. Baseline:  Goal status: INITIAL  2.  Patient will be able to complete her daily activities without her familiar pain exceeding 3/10. Baseline:  Goal status:  INITIAL  3.  Patient will be able to safely squat and pick up items from the floor without assistance for improved function cleaning her house. Baseline:  Goal status: INITIAL  4.  Patient will be able to demonstrate at least 90 degrees of active right hip flexion for improved hip mobility. Baseline:  Goal status: INITIAL  5.  Patient will be able to safely ambulate at least 80 feet without an assistive device for improved household mobility. Baseline:  Goal status: INITIAL  6.  Patient will be able to transfer from sitting to standing without upper extremity support for improved functional mobility. Baseline:  Goal status: INITIAL   PLAN:  PT FREQUENCY: 2x/week  PT DURATION: other: 7 weeks  PLANNED INTERVENTIONS: Therapeutic exercises, Therapeutic activity, Neuromuscular re-education, Balance training, Gait training, Patient/Family education, Self Care, Joint mobilization, Stair training, Electrical stimulation, Cryotherapy, Moist heat, Vasopneumatic device, Manual therapy, and Re-evaluation  PLAN FOR NEXT SESSION: Nustep, hip isometrics, gait training, lower extremity strengthening, balance interventions, and modalities as needed    Wendle Kina, [place], PT 06/07/2023, 4:11 PM

## 2023-06-09 ENCOUNTER — Ambulatory Visit: Payer: Medicare Other

## 2023-06-09 DIAGNOSIS — M25551 Pain in right hip: Secondary | ICD-10-CM | POA: Diagnosis not present

## 2023-06-09 DIAGNOSIS — R262 Difficulty in walking, not elsewhere classified: Secondary | ICD-10-CM | POA: Diagnosis not present

## 2023-06-09 DIAGNOSIS — M6281 Muscle weakness (generalized): Secondary | ICD-10-CM

## 2023-06-09 NOTE — Therapy (Signed)
OUTPATIENT PHYSICAL THERAPY LOWER EXTREMITY TREATMENT   Patient Name: Jody Wilson MRN: [id number] DOB:May 14, 1951, 72 y.o., female Today's Date: 06/09/2023  END OF SESSION:  PT End of Session - 06/09/23 1740     Visit Number 6    Number of Visits 14    Date for PT Re-Evaluation 07/15/23    PT Start Time 1600    PT Stop Time 1642    PT Time Calculation (min) 42 min    Activity Tolerance Patient tolerated treatment well    Behavior During Therapy WFL for tasks assessed/performed              Past Medical History:  Diagnosis Date   Anemia    Anxiety    Aortic atherosclerosis (HCC)    Carotid artery disease (HCC)    Depression    GERD (gastroesophageal reflux disease)    Hyperlipidemia    Hypertension    Orthostatic hypotension    Osteoporosis    Past Surgical History:  Procedure Laterality Date   ABDOMINAL HYSTERECTOMY     BACK SURGERY     CHOLECYSTECTOMY     FOOT SURGERY     7 in the past   HIP ARTHROPLASTY Right 02/11/2023   Procedure: ARTHROPLASTY BIPOLAR HIP (HEMIARTHROPLASTY);  Surgeon: Oliver Barre, MD;  Location: [facility];  Service: Orthopedics;  Laterality: Right;   IR GENERIC HISTORICAL  12/09/2016   IR RADIOLOGIST EVAL & MGMT 12/09/2016 [facility]   IR GENERIC HISTORICAL  12/10/2016   IR VERTEBROPLASTY CERV/THOR BX INC UNI/BIL INC/INJECT/IMAGING 12/10/2016 [facility]   LEFT HEART CATH AND CORONARY ANGIOGRAPHY N/A 09/30/2020   Procedure: LEFT HEART CATH AND CORONARY ANGIOGRAPHY;  Surgeon: Elder Negus, MD;  Location: [facility];  Service: Cardiovascular;  Laterality: N/A;   TRACHEOSTOMY TUBE PLACEMENT  1982   Patient Active Problem List   Diagnosis Date Noted   Closed right hip fracture (HCC) 02/09/2023   Pulmonary nodule 02/09/2023   Electrolyte abnormality 02/09/2023   Hypertriglyceridemia 01/22/2022   Abnormal findings on diagnostic imaging of liver and biliary tract 12/09/2021   Diarrhea 12/09/2021   Personal history  of colonic polyps 12/09/2021   Weight loss 12/09/2021   Anxiety and depression 06/23/2020   Hiatal hernia 06/23/2020   Thoracic aortic atherosclerosis (HCC) 06/23/2020   Tobacco abuse 04/25/2020   Involuntary muscle jerks while sleeping 09/26/2019   B12 deficiency 09/25/2019   Iron deficiency anemia 09/25/2019   Elevated liver function tests 09/07/2019   Osteoporosis 02/09/2017   History of vertebral fracture 02/09/2017   GERD (gastroesophageal reflux disease) 03/11/2016   Hyperlipidemia with target LDL less than 100 02/19/2015   Hypertension 03/18/2014    PCP: Dettinger, Elige Radon, MD  REFERRING PROVIDER: Oliver Barre, MD   REFERRING DIAG: Status post hip hemiarthroplasty   THERAPY DIAG:  Pain in right hip  Muscle weakness (generalized)  Difficulty in walking, not elsewhere classified  Rationale for Evaluation and Treatment: Rehabilitation  ONSET DATE: 02/11/23  SUBJECTIVE:   SUBJECTIVE STATEMENT: Patient reports that she is not hurting today, but is having a little muscle soreness.   PERTINENT HISTORY: Hypertension, osteoporosis, anxiety, depression, and orthostatic hypotension PAIN:  Are you having pain? Yes: NPRS scale: 0/10 Pain location: right groin and quadriceps Pain description: constant dull ache Aggravating factors: transfers, putting pressure on her right leg, sneezing, putting her shoes and socks on Relieving factors: medication  PRECAUTIONS: Fall  WEIGHT BEARING RESTRICTIONS: No  FALLS:  Has patient fallen in last  6 months? Yes. Number of falls 2  LIVING ENVIRONMENT: Lives with: lives with their spouse Lives in: House/apartment Stairs: Yes: External: 2 steps; none Has following equipment at home: Single point cane  OCCUPATION: retired  PLOF: Independent  PATIENT GOALS: reduced pain, improved strength, be able to plant her garden, walk without an assistive device, and be able to do housework  NEXT MD VISIT: none scheduled yet    OBJECTIVE:   DIAGNOSTIC FINDINGS: 05/06/23 right hip x-ray   Impression: Right hip hemiarthroplasty in stable position without subsidence.   PATIENT SURVEYS:  FOTO 28.88 on 05/23/23  COGNITION: Overall cognitive status: Within functional limits for tasks assessed     SENSATION: Patient reports no numbness or tingling  EDEMA:  No edema observed  PALPATION: TTP: right hip flexors and hip adductors   LOWER EXTREMITY ROM:  Active ROM Right eval Left eval  Hip flexion 47/ 90 (PROM); limited by pain 107  Hip extension    Hip abduction    Hip adduction    Hip internal rotation    Hip external rotation    Knee flexion    Knee extension    Ankle dorsiflexion    Ankle plantarflexion    Ankle inversion    Ankle eversion     (Blank rows = not tested)  LOWER EXTREMITY MMT:  MMT Right eval Left eval  Hip flexion 3/5 4-/5  Hip extension    Hip abduction    Hip adduction    Hip internal rotation    Hip external rotation    Knee flexion 4/5 5/5  Knee extension 3+/5; familiar pain 5/5  Ankle dorsiflexion    Ankle plantarflexion    Ankle inversion    Ankle eversion     (Blank rows = not tested)  FUNCTIONAL TESTS:  Sit to supine: requires LLE to lift RLE   GAIT: Assistive device utilized: Single point cane Level of assistance: Modified independence Comments: Decreased stance time on right lower extremity, step to pattern, and decreased gait speed   TODAY'S TREATMENT:                                                                                                                              DATE:                                     06/09/23 EXERCISE LOG  Exercise Repetitions and Resistance Comments  Nustep  L4 x 15 minutes    Step up  4"step x 2 minutes  Simulating a reciprocal pattern  Lunges onto step  8" step x 25 reps    Seated R hip IR/ER  25 reps each         Blank cell = exercise not performed today  Manual Therapy Passive ROM: right hip flexion,  internal, and external rotation, to tolerance  06/02/23 EXERCISE LOG  Exercise Repetitions and Resistance Comments  Nustep  L3 x 20 minutes    In parallel bars:  Rockerboard x 4 minutes, Airex balance pad x 4 minutes and foam balance beam x 2 minutes.                                 PATIENT EDUCATION:  Education details: Healing, anatomy, prognosis, plan of care, and goals for therapy Person educated: Patient Education method: Explanation Education comprehension: verbalized understanding  HOME EXERCISE PROGRAM: [id number]  ASSESSMENT:  CLINICAL IMPRESSION: Patient was introduced to lunges onto a step and seated hip internal and external rotation for improved hip mobility. She experienced a brief "sting" with lunges onto a step, but this resolved quickly and then "felt good." Manual therapy focused on right hip passive range of motion for improved hip mobility. She was able to demonstrate an improvement in right hip flexion and external rotation following manual therapy. She also felt that her hip could move further at the conclusion of treatment. She continues to require skilled physical therapy to address her remaining impairments to return to her prior level of function.   OBJECTIVE IMPAIRMENTS: Abnormal gait, decreased activity tolerance, decreased balance, decreased mobility, difficulty walking, decreased ROM, decreased strength, impaired tone, and pain.   ACTIVITY LIMITATIONS: carrying, lifting, standing, squatting, stairs, transfers, dressing, and locomotion level  PARTICIPATION LIMITATIONS: meal prep, cleaning, laundry, shopping, community activity, and yard work  PERSONAL FACTORS: Time since onset of injury/illness/exacerbation and 3+ comorbidities: Hypertension, osteoporosis, anxiety, depression, and orthostatic hypotension  are also affecting patient's functional outcome.   REHAB POTENTIAL: Good  CLINICAL DECISION MAKING: Evolving/moderate  complexity  EVALUATION COMPLEXITY: Moderate   GOALS: Goals reviewed with patient? Yes  SHORT TERM GOALS: Target date: 06/09/23 Patient will be independent with her initial HEP. Baseline: Goal status: INITIAL  2.  Patient will be able to complete her daily activities without her familiar pain exceeding 5/10. Baseline:  Goal status: INITIAL  3.  Patient will be able to independently transfer from sitting to supine for improved bed mobility. Baseline:  Goal status: INITIAL  4.  Patient will be able to demonstrate at least 60 degrees of active right hip flexion for improved hip mobility. Baseline:  Goal status: INITIAL  LONG TERM GOALS: Target date: 07/07/23  Patient will be independent with her advanced HEP. Baseline:  Goal status: INITIAL  2.  Patient will be able to complete her daily activities without her familiar pain exceeding 3/10. Baseline:  Goal status: INITIAL  3.  Patient will be able to safely squat and pick up items from the floor without assistance for improved function cleaning her house. Baseline:  Goal status: INITIAL  4.  Patient will be able to demonstrate at least 90 degrees of active right hip flexion for improved hip mobility. Baseline:  Goal status: INITIAL  5.  Patient will be able to safely ambulate at least 80 feet without an assistive device for improved household mobility. Baseline:  Goal status: INITIAL  6.  Patient will be able to transfer from sitting to standing without upper extremity support for improved functional mobility. Baseline:  Goal status: INITIAL   PLAN:  PT FREQUENCY: 2x/week  PT DURATION: other: 7 weeks  PLANNED INTERVENTIONS: Therapeutic exercises, Therapeutic activity, Neuromuscular re-education, Balance training, Gait training, Patient/Family education, Self Care, Joint mobilization, Stair training, Electrical stimulation, Cryotherapy, Moist heat, Vasopneumatic device, Manual therapy, and Re-evaluation  PLAN FOR  NEXT SESSION: Nustep, hip isometrics, gait training, lower extremity strengthening, balance interventions, and modalities as needed    Granville Lewis, PT 06/09/2023, 5:42 PM

## 2023-06-14 ENCOUNTER — Encounter: Payer: Medicare Other | Admitting: Physical Therapy

## 2023-06-15 ENCOUNTER — Ambulatory Visit: Payer: Medicare Other | Admitting: Physical Therapy

## 2023-06-15 DIAGNOSIS — R262 Difficulty in walking, not elsewhere classified: Secondary | ICD-10-CM | POA: Diagnosis not present

## 2023-06-15 DIAGNOSIS — M6281 Muscle weakness (generalized): Secondary | ICD-10-CM | POA: Diagnosis not present

## 2023-06-15 DIAGNOSIS — M25551 Pain in right hip: Secondary | ICD-10-CM | POA: Diagnosis not present

## 2023-06-15 NOTE — Therapy (Signed)
OUTPATIENT PHYSICAL THERAPY LOWER EXTREMITY TREATMENT   Patient Name: Jody Wilson MRN: [id number] DOB:12-Apr-1951, 72 y.o., female Today's Date: 06/15/2023  END OF SESSION:  PT End of Session - 06/15/23 1109     Visit Number 7    Number of Visits 14    Date for PT Re-Evaluation 07/15/23    PT Start Time 1015    PT Stop Time 1056    PT Time Calculation (min) 41 min    Activity Tolerance Patient tolerated treatment well    Behavior During Therapy WFL for tasks assessed/performed              Past Medical History:  Diagnosis Date   Anemia    Anxiety    Aortic atherosclerosis (HCC)    Carotid artery disease (HCC)    Depression    GERD (gastroesophageal reflux disease)    Hyperlipidemia    Hypertension    Orthostatic hypotension    Osteoporosis    Past Surgical History:  Procedure Laterality Date   ABDOMINAL HYSTERECTOMY     BACK SURGERY     CHOLECYSTECTOMY     FOOT SURGERY     7 in the past   HIP ARTHROPLASTY Right 02/11/2023   Procedure: ARTHROPLASTY BIPOLAR HIP (HEMIARTHROPLASTY);  Surgeon: Oliver Barre, MD;  Location: [facility];  Service: Orthopedics;  Laterality: Right;   IR GENERIC HISTORICAL  12/09/2016   IR RADIOLOGIST EVAL & MGMT 12/09/2016 [facility]   IR GENERIC HISTORICAL  12/10/2016   IR VERTEBROPLASTY CERV/THOR BX INC UNI/BIL INC/INJECT/IMAGING 12/10/2016 [facility]   LEFT HEART CATH AND CORONARY ANGIOGRAPHY N/A 09/30/2020   Procedure: LEFT HEART CATH AND CORONARY ANGIOGRAPHY;  Surgeon: Elder Negus, MD;  Location: [facility];  Service: Cardiovascular;  Laterality: N/A;   TRACHEOSTOMY TUBE PLACEMENT  1982   Patient Active Problem List   Diagnosis Date Noted   Closed right hip fracture (HCC) 02/09/2023   Pulmonary nodule 02/09/2023   Electrolyte abnormality 02/09/2023   Hypertriglyceridemia 01/22/2022   Abnormal findings on diagnostic imaging of liver and biliary tract 12/09/2021   Diarrhea 12/09/2021   Personal history  of colonic polyps 12/09/2021   Weight loss 12/09/2021   Anxiety and depression 06/23/2020   Hiatal hernia 06/23/2020   Thoracic aortic atherosclerosis (HCC) 06/23/2020   Tobacco abuse 04/25/2020   Involuntary muscle jerks while sleeping 09/26/2019   B12 deficiency 09/25/2019   Iron deficiency anemia 09/25/2019   Elevated liver function tests 09/07/2019   Osteoporosis 02/09/2017   History of vertebral fracture 02/09/2017   GERD (gastroesophageal reflux disease) 03/11/2016   Hyperlipidemia with target LDL less than 100 02/19/2015   Hypertension 03/18/2014    PCP: Dettinger, Elige Radon, MD  REFERRING PROVIDER: Oliver Barre, MD   REFERRING DIAG: Status post hip hemiarthroplasty   THERAPY DIAG:  Pain in right hip  Muscle weakness (generalized)  Difficulty in walking, not elsewhere classified  Rationale for Evaluation and Treatment: Rehabilitation  ONSET DATE: 02/11/23  SUBJECTIVE:   SUBJECTIVE STATEMENT: Doing well and very pleased with progress. PERTINENT HISTORY: Hypertension, osteoporosis, anxiety, depression, and orthostatic hypotension PAIN:  Are you having pain? Yes: NPRS scale: 0/10 Pain location: right groin and quadriceps Pain description: constant dull ache Aggravating factors: transfers, putting pressure on her right leg, sneezing, putting her shoes and socks on Relieving factors: medication  PRECAUTIONS: Fall  WEIGHT BEARING RESTRICTIONS: No  FALLS:  Has patient fallen in last 6 months? Yes. Number of falls 2  LIVING ENVIRONMENT:  Lives with: lives with their spouse Lives in: House/apartment Stairs: Yes: External: 2 steps; none Has following equipment at home: Single point cane  OCCUPATION: retired  PLOF: Independent  PATIENT GOALS: reduced pain, improved strength, be able to plant her garden, walk without an assistive device, and be able to do housework  NEXT MD VISIT: none scheduled yet   OBJECTIVE:   DIAGNOSTIC FINDINGS: 05/06/23 right hip  x-ray   Impression: Right hip hemiarthroplasty in stable position without subsidence.   PATIENT SURVEYS:  FOTO 28.88 on 05/23/23  COGNITION: Overall cognitive status: Within functional limits for tasks assessed     SENSATION: Patient reports no numbness or tingling  EDEMA:  No edema observed  PALPATION: TTP: right hip flexors and hip adductors   LOWER EXTREMITY ROM:  Active ROM Right eval Left eval  Hip flexion 47/ 90 (PROM); limited by pain 107  Hip extension    Hip abduction    Hip adduction    Hip internal rotation    Hip external rotation    Knee flexion    Knee extension    Ankle dorsiflexion    Ankle plantarflexion    Ankle inversion    Ankle eversion     (Blank rows = not tested)  LOWER EXTREMITY MMT:  MMT Right eval Left eval  Hip flexion 3/5 4-/5  Hip extension    Hip abduction    Hip adduction    Hip internal rotation    Hip external rotation    Knee flexion 4/5 5/5  Knee extension 3+/5; familiar pain 5/5  Ankle dorsiflexion    Ankle plantarflexion    Ankle inversion    Ankle eversion     (Blank rows = not tested)  FUNCTIONAL TESTS:  Sit to supine: requires LLE to lift RLE   GAIT: Assistive device utilized: Single point cane Level of assistance: Modified independence Comments: Decreased stance time on right lower extremity, step to pattern, and decreased gait speed   TODAY'S TREATMENT:                                                                                                                              DATE:    06/14/23:                                   06/02/23 EXERCISE LOG  Exercise Repetitions and Resistance Comments  Nustep  L3-4 performing one mile in 21 minutes.   In parallel bars:  Rockerboard x 4 minutes in parallel bars f/b ball tosses while patient stood on Airex balance pad x 4 minutes f/b red theraband resisted hip abduction x 3 minutes.                                PATIENT EDUCATION:  Education details: Healing,  anatomy, prognosis, plan  of care, and goals for therapy Person educated: Patient Education method: Explanation Education comprehension: verbalized understanding  HOME EXERCISE PROGRAM: [email or web address]  ASSESSMENT:  CLINICAL IMPRESSION: Patient did an excellent job today.  She was very pleased that she was able to perform a mile on the Nustep with an excellent cadence.  She did very well with Neuro-re-education activities and she states she feels much more stable while walking.  OBJECTIVE IMPAIRMENTS: Abnormal gait, decreased activity tolerance, decreased balance, decreased mobility, difficulty walking, decreased ROM, decreased strength, impaired tone, and pain.   ACTIVITY LIMITATIONS: carrying, lifting, standing, squatting, stairs, transfers, dressing, and locomotion level  PARTICIPATION LIMITATIONS: meal prep, cleaning, laundry, shopping, community activity, and yard work  PERSONAL FACTORS: Time since onset of injury/illness/exacerbation and 3+ comorbidities: Hypertension, osteoporosis, anxiety, depression, and orthostatic hypotension  are also affecting patient's functional outcome.   REHAB POTENTIAL: Good  CLINICAL DECISION MAKING: Evolving/moderate complexity  EVALUATION COMPLEXITY: Moderate   GOALS: Goals reviewed with patient? Yes  SHORT TERM GOALS: Target date: 06/09/23 Patient will be independent with her initial HEP. Baseline: Goal status: INITIAL  2.  Patient will be able to complete her daily activities without her familiar pain exceeding 5/10. Baseline:  Goal status: INITIAL  3.  Patient will be able to independently transfer from sitting to supine for improved bed mobility. Baseline:  Goal status: INITIAL  4.  Patient will be able to demonstrate at least 60 degrees of active right hip flexion for improved hip mobility. Baseline:  Goal status: INITIAL  LONG TERM GOALS: Target date: 07/07/23  Patient will be independent with her advanced HEP. Baseline:  Goal  status: INITIAL  2.  Patient will be able to complete her daily activities without her familiar pain exceeding 3/10. Baseline:  Goal status: INITIAL  3.  Patient will be able to safely squat and pick up items from the floor without assistance for improved function cleaning her house. Baseline:  Goal status: INITIAL  4.  Patient will be able to demonstrate at least 90 degrees of active right hip flexion for improved hip mobility. Baseline:  Goal status: INITIAL  5.  Patient will be able to safely ambulate at least 80 feet without an assistive device for improved household mobility. Baseline:  Goal status: INITIAL  6.  Patient will be able to transfer from sitting to standing without upper extremity support for improved functional mobility. Baseline:  Goal status: INITIAL   PLAN:  PT FREQUENCY: 2x/week  PT DURATION: other: 7 weeks  PLANNED INTERVENTIONS: Therapeutic exercises, Therapeutic activity, Neuromuscular re-education, Balance training, Gait training, Patient/Family education, Self Care, Joint mobilization, Stair training, Electrical stimulation, Cryotherapy, Moist heat, Vasopneumatic device, Manual therapy, and Re-evaluation  PLAN FOR NEXT SESSION: Nustep, hip isometrics, gait training, lower extremity strengthening, balance interventions, and modalities as needed    Baby Stairs, [place], PT 06/15/2023, 11:15 AM  OUTPATIENT PHYSICAL THERAPY LOWER EXTREMITY TREATMENT   Patient Name: Jody Wilson MRN: [id number] DOB:06-28-1951, 72 y.o., female Today's Date: 06/15/2023  END OF SESSION:  PT End of Session - 06/15/23 1109     Visit Number 7    Number of Visits 14    Date for PT Re-Evaluation 07/15/23    PT Start Time 1015    PT Stop Time 1056    PT Time Calculation (min) 41 min    Activity Tolerance Patient tolerated treatment well    Behavior During Therapy [facility] for tasks assessed/performed  Past Medical History:  Diagnosis Date   Anemia     Anxiety    Aortic atherosclerosis (HCC)    Carotid artery disease (HCC)    Depression    GERD (gastroesophageal reflux disease)    Hyperlipidemia    Hypertension    Orthostatic hypotension    Osteoporosis    Past Surgical History:  Procedure Laterality Date   ABDOMINAL HYSTERECTOMY     BACK SURGERY     CHOLECYSTECTOMY     FOOT SURGERY     7 in the past   HIP ARTHROPLASTY Right 02/11/2023   Procedure: ARTHROPLASTY BIPOLAR HIP (HEMIARTHROPLASTY);  Surgeon: Oliver Barre, MD;  Location: [facility];  Service: Orthopedics;  Laterality: Right;   IR GENERIC HISTORICAL  12/09/2016   IR RADIOLOGIST EVAL & MGMT 12/09/2016 [facility]   IR GENERIC HISTORICAL  12/10/2016   IR VERTEBROPLASTY CERV/THOR BX INC UNI/BIL INC/INJECT/IMAGING 12/10/2016 [facility]   LEFT HEART CATH AND CORONARY ANGIOGRAPHY N/A 09/30/2020   Procedure: LEFT HEART CATH AND CORONARY ANGIOGRAPHY;  Surgeon: Elder Negus, MD;  [facility];  Service: Cardiovascular;  Laterality: N/A;   TRACHEOSTOMY TUBE PLACEMENT  1982   Patient Active Problem List   Diagnosis Date Noted   Closed right hip fracture (HCC) 02/09/2023   Pulmonary nodule 02/09/2023   Electrolyte abnormality 02/09/2023   Hypertriglyceridemia 01/22/2022   Abnormal findings on diagnostic imaging of liver and biliary tract 12/09/2021   Diarrhea 12/09/2021   Personal history of colonic polyps 12/09/2021   Weight loss 12/09/2021   Anxiety and depression 06/23/2020   Hiatal hernia 06/23/2020   Thoracic aortic atherosclerosis (HCC) 06/23/2020   Tobacco abuse 04/25/2020   Involuntary muscle jerks while sleeping 09/26/2019   B12 deficiency 09/25/2019   Iron deficiency anemia 09/25/2019   Elevated liver function tests 09/07/2019   Osteoporosis 02/09/2017   History of vertebral fracture 02/09/2017   GERD (gastroesophageal reflux disease) 03/11/2016   Hyperlipidemia with target LDL less than 100 02/19/2015   Hypertension 03/18/2014     PCP: Dettinger, Elige Radon, MD  REFERRING PROVIDER: Oliver Barre, MD   REFERRING DIAG: Status post hip hemiarthroplasty   THERAPY DIAG:  Pain in right hip  Muscle weakness (generalized)  Difficulty in walking, not elsewhere classified  Rationale for Evaluation and Treatment: Rehabilitation  ONSET DATE: 02/11/23  SUBJECTIVE:   SUBJECTIVE STATEMENT: Patient reports that she is not hurting today, but is having a little muscle soreness.   PERTINENT HISTORY: Hypertension, osteoporosis, anxiety, depression, and orthostatic hypotension PAIN:  Are you having pain? Yes: NPRS scale: 0/10 Pain location: right groin and quadriceps Pain description: constant dull ache Aggravating factors: transfers, putting pressure on her right leg, sneezing, putting her shoes and socks on Relieving factors: medication  PRECAUTIONS: Fall  WEIGHT BEARING RESTRICTIONS: No  FALLS:  Has patient fallen in last 6 months? Yes. Number of falls 2  LIVING ENVIRONMENT: Lives with: lives with their spouse Lives in: House/apartment Stairs: Yes: External: 2 steps; none Has following equipment at home: Single point cane  OCCUPATION: retired  PLOF: Independent  PATIENT GOALS: reduced pain, improved strength, be able to plant her garden, walk without an assistive device, and be able to do housework  NEXT MD VISIT: none scheduled yet   OBJECTIVE:   DIAGNOSTIC FINDINGS: 05/06/23 right hip x-ray   Impression: Right hip hemiarthroplasty in stable position without subsidence.   PATIENT SURVEYS:  FOTO 28.88 on 05/23/23  COGNITION: Overall cognitive status: Within functional limits for  tasks assessed     SENSATION: Patient reports no numbness or tingling  EDEMA:  No edema observed  PALPATION: TTP: right hip flexors and hip adductors   LOWER EXTREMITY ROM:  Active ROM Right eval Left eval  Hip flexion 47/ 90 (PROM); limited by pain 107  Hip extension    Hip abduction    Hip adduction     Hip internal rotation    Hip external rotation    Knee flexion    Knee extension    Ankle dorsiflexion    Ankle plantarflexion    Ankle inversion    Ankle eversion     (Blank rows = not tested)  LOWER EXTREMITY MMT:  MMT Right eval Left eval  Hip flexion 3/5 4-/5  Hip extension    Hip abduction    Hip adduction    Hip internal rotation    Hip external rotation    Knee flexion 4/5 5/5  Knee extension 3+/5; familiar pain 5/5  Ankle dorsiflexion    Ankle plantarflexion    Ankle inversion    Ankle eversion     (Blank rows = not tested)  FUNCTIONAL TESTS:  Sit to supine: requires LLE to lift RLE   GAIT: Assistive device utilized: Single point cane Level of assistance: Modified independence Comments: Decreased stance time on right lower extremity, step to pattern, and decreased gait speed   TODAY'S TREATMENT:                                                                                                                              DATE:                                     06/09/23 EXERCISE LOG  Exercise Repetitions and Resistance Comments                                     06/02/23 EXERCISE LOG  Exercise Repetitions and Resistance Comments  Nustep  L3 x 20 minutes    In parallel bars:  Rockerboard x 4 minutes, Airex balance pad x 4 minutes and foam balance beam x 2 minutes.                                 PATIENT EDUCATION:  Education details: Healing, anatomy, prognosis, plan of care, and goals for therapy Person educated: Patient Education method: Explanation Education comprehension: verbalized understanding  HOME EXERCISE PROGRAM: [id number]  ASSESSMENT:  CLINICAL IMPRESSION: Patient was introduced to lunges onto a step and seated hip internal and external rotation for improved hip mobility. She experienced a brief "sting" with lunges onto a step, but this resolved quickly and then "felt good." Manual therapy focused on right hip passive range  of motion  for improved hip mobility. She was able to demonstrate an improvement in right hip flexion and external rotation following manual therapy. She also felt that her hip could move further at the conclusion of treatment. She continues to require skilled physical therapy to address her remaining impairments to return to her prior level of function.   OBJECTIVE IMPAIRMENTS: Abnormal gait, decreased activity tolerance, decreased balance, decreased mobility, difficulty walking, decreased ROM, decreased strength, impaired tone, and pain.   ACTIVITY LIMITATIONS: carrying, lifting, standing, squatting, stairs, transfers, dressing, and locomotion level  PARTICIPATION LIMITATIONS: meal prep, cleaning, laundry, shopping, community activity, and yard work  PERSONAL FACTORS: Time since onset of injury/illness/exacerbation and 3+ comorbidities: Hypertension, osteoporosis, anxiety, depression, and orthostatic hypotension  are also affecting patient's functional outcome.   REHAB POTENTIAL: Good  CLINICAL DECISION MAKING: Evolving/moderate complexity  EVALUATION COMPLEXITY: Moderate   GOALS: Goals reviewed with patient? Yes  SHORT TERM GOALS: Target date: 06/09/23 Patient will be independent with her initial HEP. Baseline: Goal status: INITIAL  2.  Patient will be able to complete her daily activities without her familiar pain exceeding 5/10. Baseline:  Goal status: INITIAL  3.  Patient will be able to independently transfer from sitting to supine for improved bed mobility. Baseline:  Goal status: INITIAL  4.  Patient will be able to demonstrate at least 60 degrees of active right hip flexion for improved hip mobility. Baseline:  Goal status: INITIAL  LONG TERM GOALS: Target date: 07/07/23  Patient will be independent with her advanced HEP. Baseline:  Goal status: INITIAL  2.  Patient will be able to complete her daily activities without her familiar pain exceeding 3/10. Baseline:  Goal  status: INITIAL  3.  Patient will be able to safely squat and pick up items from the floor without assistance for improved function cleaning her house. Baseline:  Goal status: INITIAL  4.  Patient will be able to demonstrate at least 90 degrees of active right hip flexion for improved hip mobility. Baseline:  Goal status: INITIAL  5.  Patient will be able to safely ambulate at least 80 feet without an assistive device for improved household mobility. Baseline:  Goal status: INITIAL  6.  Patient will be able to transfer from sitting to standing without upper extremity support for improved functional mobility. Baseline:  Goal status: INITIAL   PLAN:  PT FREQUENCY: 2x/week  PT DURATION: other: 7 weeks  PLANNED INTERVENTIONS: Therapeutic exercises, Therapeutic activity, Neuromuscular re-education, Balance training, Gait training, Patient/Family education, Self Care, Joint mobilization, Stair training, Electrical stimulation, Cryotherapy, Moist heat, Vasopneumatic device, Manual therapy, and Re-evaluation  PLAN FOR NEXT SESSION: Nustep, hip isometrics, gait training, lower extremity strengthening, balance interventions, and modalities as needed    Koby Pickup, [place], PT 06/15/2023, 11:15 AM

## 2023-06-16 ENCOUNTER — Ambulatory Visit: Payer: Medicare Other

## 2023-06-16 DIAGNOSIS — R262 Difficulty in walking, not elsewhere classified: Secondary | ICD-10-CM | POA: Diagnosis not present

## 2023-06-16 DIAGNOSIS — M25551 Pain in right hip: Secondary | ICD-10-CM

## 2023-06-16 DIAGNOSIS — M6281 Muscle weakness (generalized): Secondary | ICD-10-CM

## 2023-06-16 NOTE — Therapy (Addendum)
 OUTPATIENT PHYSICAL THERAPY LOWER EXTREMITY TREATMENT   Patient Name: Jody Wilson MRN: [id number] DOB:02/12/1951, 72 y.o., female Today's Date: 06/16/2023  END OF SESSION:  PT End of Session - 06/16/23 1436     Visit Number 8    Number of Visits 14    Date for PT Re-Evaluation 07/15/23    PT Start Time 1430    PT Stop Time 1514    PT Time Calculation (min) 44 min    Activity Tolerance Patient tolerated treatment well    Behavior During Therapy WFL for tasks assessed/performed                Past Medical History:  Diagnosis Date   Anemia    Anxiety    Aortic atherosclerosis (HCC)    Carotid artery disease (HCC)    Depression    GERD (gastroesophageal reflux disease)    Hyperlipidemia    Hypertension    Orthostatic hypotension    Osteoporosis    Past Surgical History:  Procedure Laterality Date   ABDOMINAL HYSTERECTOMY     BACK SURGERY     CHOLECYSTECTOMY     FOOT SURGERY     7 in the past   HIP ARTHROPLASTY Right 02/11/2023   Procedure: ARTHROPLASTY BIPOLAR HIP (HEMIARTHROPLASTY);  Surgeon: Oliver Barre, MD;  Location: [facility];  Service: Orthopedics;  Laterality: Right;   IR GENERIC HISTORICAL  12/09/2016   IR RADIOLOGIST EVAL & MGMT 12/09/2016 [facility]   IR GENERIC HISTORICAL  12/10/2016   IR VERTEBROPLASTY CERV/THOR BX INC UNI/BIL INC/INJECT/IMAGING 12/10/2016 [facility]   LEFT HEART CATH AND CORONARY ANGIOGRAPHY N/A 09/30/2020   Procedure: LEFT HEART CATH AND CORONARY ANGIOGRAPHY;  Surgeon: Elder Negus, MD;  Location: [facility];  Service: Cardiovascular;  Laterality: N/A;   TRACHEOSTOMY TUBE PLACEMENT  1982   Patient Active Problem List   Diagnosis Date Noted   Closed right hip fracture (HCC) 02/09/2023   Pulmonary nodule 02/09/2023   Electrolyte abnormality 02/09/2023   Hypertriglyceridemia 01/22/2022   Abnormal findings on diagnostic imaging of liver and biliary tract 12/09/2021   Diarrhea 12/09/2021   Personal  history of colonic polyps 12/09/2021   Weight loss 12/09/2021   Anxiety and depression 06/23/2020   Hiatal hernia 06/23/2020   Thoracic aortic atherosclerosis (HCC) 06/23/2020   Tobacco abuse 04/25/2020   Involuntary muscle jerks while sleeping 09/26/2019   B12 deficiency 09/25/2019   Iron deficiency anemia 09/25/2019   Elevated liver function tests 09/07/2019   Osteoporosis 02/09/2017   History of vertebral fracture 02/09/2017   GERD (gastroesophageal reflux disease) 03/11/2016   Hyperlipidemia with target LDL less than 100 02/19/2015   Hypertension 03/18/2014    PCP: Dettinger, Elige Radon, MD  REFERRING PROVIDER: Oliver Barre, MD   REFERRING DIAG: Status post hip hemiarthroplasty   THERAPY DIAG:  Pain in right hip  Muscle weakness (generalized)  Difficulty in walking, not elsewhere classified  Rationale for Evaluation and Treatment: Rehabilitation  ONSET DATE: 02/11/23  SUBJECTIVE:   SUBJECTIVE STATEMENT: Patient reports that she is not hurting today, but is having a little muscle soreness.   PERTINENT HISTORY: Hypertension, osteoporosis, anxiety, depression, and orthostatic hypotension PAIN:  Are you having pain? Yes: NPRS scale: 0/10 Pain location: right groin and quadriceps Pain description: constant dull ache Aggravating factors: transfers, putting pressure on her right leg, sneezing, putting her shoes and socks on Relieving factors: medication  PRECAUTIONS: Fall  WEIGHT BEARING RESTRICTIONS: No  FALLS:  Has patient fallen  in last 6 months? Yes. Number of falls 2  LIVING ENVIRONMENT: Lives with: lives with their spouse Lives in: House/apartment Stairs: Yes: External: 2 steps; none Has following equipment at home: Single point cane  OCCUPATION: retired  PLOF: Independent  PATIENT GOALS: reduced pain, improved strength, be able to plant her garden, walk without an assistive device, and be able to do housework  NEXT MD VISIT: none scheduled yet    OBJECTIVE:   DIAGNOSTIC FINDINGS: 05/06/23 right hip x-ray   Impression: Right hip hemiarthroplasty in stable position without subsidence.   PATIENT SURVEYS:  FOTO 28.88 on 05/23/23  COGNITION: Overall cognitive status: Within functional limits for tasks assessed     SENSATION: Patient reports no numbness or tingling  EDEMA:  No edema observed  PALPATION: TTP: right hip flexors and hip adductors   LOWER EXTREMITY ROM:  Active ROM Right eval Left eval  Hip flexion 47/ 90 (PROM); limited by pain 107  Hip extension    Hip abduction    Hip adduction    Hip internal rotation    Hip external rotation    Knee flexion    Knee extension    Ankle dorsiflexion    Ankle plantarflexion    Ankle inversion    Ankle eversion     (Blank rows = not tested)  LOWER EXTREMITY MMT:  MMT Right eval Left eval  Hip flexion 3/5 4-/5  Hip extension    Hip abduction    Hip adduction    Hip internal rotation    Hip external rotation    Knee flexion 4/5 5/5  Knee extension 3+/5; familiar pain 5/5  Ankle dorsiflexion    Ankle plantarflexion    Ankle inversion    Ankle eversion     (Blank rows = not tested)  FUNCTIONAL TESTS:  Sit to supine: requires LLE to lift RLE   GAIT: Assistive device utilized: Single point cane Level of assistance: Modified independence Comments: Decreased stance time on right lower extremity, step to pattern, and decreased gait speed   TODAY'S TREATMENT:                                                                                                                              DATE:                                     06/16/23 EXERCISE LOG  Exercise Repetitions and Resistance Comments  Nustep  L5 x 15 minutes    Step up  8" step x 2 minutes   Wall squat  2.5 minutes  With red ball behind her back  Rocker board  4 minutes  Intermittent UE support  Side stepping Red t-band @ ankles x 7 laps   Ball toss on foam  2.5 minutes  Without UE support    Blank cell = exercise not performed today  06/02/23 EXERCISE LOG  Exercise Repetitions and Resistance Comments  Nustep  L3 x 20 minutes    In parallel bars:  Rockerboard x 4 minutes, Airex balance pad x 4 minutes and foam balance beam x 2 minutes.                                 PATIENT EDUCATION:  Education details: POC, healing, progress with therapy, and walking Person educated: Patient Education method: Explanation Education comprehension: verbalized understanding  HOME EXERCISE PROGRAM: [id number]  ASSESSMENT:  CLINICAL IMPRESSION: Patient was progressed with multiple new and familiar interventions for improved hip strength and stability. She required minimal cueing with resisted side stepping to promote increased foot clearance to facilitate increased hip abductor engagement. She experienced no increase in pain or discomfort with any of today's interventions. She reported that her hip felt good upon the conclusion of treatment. She continues to require skilled physical therapy to address her remaining impairments to return to her prior level of function.   PHYSICAL THERAPY DISCHARGE SUMMARY  Visits from Start of Care: 8  Current functional level related to goals / functional outcomes: Patient was able to partially meet her goals for skilled physical therapy.    Remaining deficits: AROM, gait mechanics   Education / Equipment: HEP    Patient agrees to discharge. Patient goals were partially met. Patient is being discharged due to not returning since the last visit.  Candi Leash, PT, DPT    OBJECTIVE IMPAIRMENTS: Abnormal gait, decreased activity tolerance, decreased balance, decreased mobility, difficulty walking, decreased ROM, decreased strength, impaired tone, and pain.   ACTIVITY LIMITATIONS: carrying, lifting, standing, squatting, stairs, transfers, dressing, and locomotion level  PARTICIPATION LIMITATIONS: meal prep, cleaning,  laundry, shopping, community activity, and yard work  PERSONAL FACTORS: Time since onset of injury/illness/exacerbation and 3+ comorbidities: Hypertension, osteoporosis, anxiety, depression, and orthostatic hypotension  are also affecting patient's functional outcome.   REHAB POTENTIAL: Good  CLINICAL DECISION MAKING: Evolving/moderate complexity  EVALUATION COMPLEXITY: Moderate   GOALS: Goals reviewed with patient? Yes  SHORT TERM GOALS: Target date: 06/09/23 Patient will be independent with her initial HEP. Baseline: Goal status: MET  2.  Patient will be able to complete her daily activities without her familiar pain exceeding 5/10. Baseline:  Goal status: MET  3.  Patient will be able to independently transfer from sitting to supine for improved bed mobility. Baseline:  Goal status: INITIAL  4.  Patient will be able to demonstrate at least 60 degrees of active right hip flexion for improved hip mobility. Baseline:  Goal status: INITIAL  LONG TERM GOALS: Target date: 07/07/23  Patient will be independent with her advanced HEP. Baseline:  Goal status: INITIAL  2.  Patient will be able to complete her daily activities without her familiar pain exceeding 3/10. Baseline:  Goal status: MET  3.  Patient will be able to safely squat and pick up items from the floor without assistance for improved function cleaning her house. Baseline:  Goal status: INITIAL  4.  Patient will be able to demonstrate at least 90 degrees of active right hip flexion for improved hip mobility. Baseline:  Goal status: INITIAL  5.  Patient will be able to safely ambulate at least 80 feet without an assistive device for improved household mobility. Baseline:  Goal status: IN PROGRESS  6.  Patient will be able to transfer from sitting to standing without upper extremity support for  improved functional mobility. Baseline:  Goal status: INITIAL   PLAN:  PT FREQUENCY: 2x/week  PT DURATION:  other: 7 weeks  PLANNED INTERVENTIONS: Therapeutic exercises, Therapeutic activity, Neuromuscular re-education, Balance training, Gait training, Patient/Family education, Self Care, Joint mobilization, Stair training, Electrical stimulation, Cryotherapy, Moist heat, Vasopneumatic device, Manual therapy, and Re-evaluation  PLAN FOR NEXT SESSION: Nustep, hip isometrics, gait training, lower extremity strengthening, balance interventions, and modalities as needed    Granville Lewis, PT 06/16/2023, 3:54 PM

## 2023-06-17 ENCOUNTER — Telehealth: Payer: Self-pay | Admitting: Orthopedic Surgery

## 2023-06-17 NOTE — Telephone Encounter (Signed)
[place]   Patient called and states he needs to talk with Dr. Dallas Schimke or his nurse about therapy.    Please call him back at [phone]

## 2023-06-17 NOTE — Telephone Encounter (Signed)
Spoke w pt who states that PT is concerned that pt is still unable to sit in a chair and rais leg to put a sock on, cross leg, or squat. Pt states all and all she feels fine and only takes some ibuprofen for her minor discomfort. Wanted to call to see if there is anything different she should be doing at this point. Please advise.  Right hip hemiarthroplasty DOS: 02/11/2023

## 2023-06-22 ENCOUNTER — Ambulatory Visit: Payer: Medicare Other

## 2023-06-24 NOTE — Telephone Encounter (Signed)
Called pt and gave her information from provider. Pt verbalized understanding.

## 2023-06-27 ENCOUNTER — Ambulatory Visit: Payer: Medicare Other | Admitting: Physical Therapy

## 2023-06-30 ENCOUNTER — Ambulatory Visit (INDEPENDENT_AMBULATORY_CARE_PROVIDER_SITE_OTHER): Payer: Medicare Other | Admitting: Family Medicine

## 2023-06-30 ENCOUNTER — Encounter: Payer: Self-pay | Admitting: Family Medicine

## 2023-06-30 ENCOUNTER — Encounter: Payer: Medicare Other | Admitting: Physical Therapy

## 2023-06-30 ENCOUNTER — Other Ambulatory Visit: Payer: Self-pay

## 2023-06-30 VITALS — BP 156/69 | HR 58 | Ht 65.0 in | Wt 142.0 lb

## 2023-06-30 DIAGNOSIS — E559 Vitamin D deficiency, unspecified: Secondary | ICD-10-CM

## 2023-06-30 DIAGNOSIS — I10 Essential (primary) hypertension: Secondary | ICD-10-CM | POA: Diagnosis not present

## 2023-06-30 DIAGNOSIS — E785 Hyperlipidemia, unspecified: Secondary | ICD-10-CM

## 2023-06-30 DIAGNOSIS — F419 Anxiety disorder, unspecified: Secondary | ICD-10-CM

## 2023-06-30 DIAGNOSIS — F32A Depression, unspecified: Secondary | ICD-10-CM

## 2023-06-30 DIAGNOSIS — Z79899 Other long term (current) drug therapy: Secondary | ICD-10-CM | POA: Diagnosis not present

## 2023-06-30 DIAGNOSIS — D509 Iron deficiency anemia, unspecified: Secondary | ICD-10-CM

## 2023-06-30 MED ORDER — CITALOPRAM HYDROBROMIDE 40 MG PO TABS: 40.0000 mg | ORAL_TABLET | Freq: Every day | ORAL | 1 refills | Status: DC

## 2023-06-30 MED ORDER — ALPRAZOLAM 0.5 MG PO TABS: 0.5000 mg | ORAL_TABLET | Freq: Two times a day (BID) | ORAL | 2 refills | Status: DC | PRN

## 2023-06-30 NOTE — Progress Notes (Signed)
BP (!) 156/69   Pulse (!) 58   Ht 5\' 5"  (1.651 m)   Wt 142 lb (64.4 kg)   SpO2 97%   BMI 23.63 kg/m    Subjective:   Patient ID: Jody Wilson, female    DOB: [birth year], 72 y.o.   MRN: [id number]  HPI: Jody Wilson is a 72 y.o. female presenting on 06/30/2023 for Medical Management of Chronic Issues, Hypertension, Anxiety, and Depression   HPI Anxiety recheck Current rx-alprazolam 0.5 mg twice daily as needed # meds rx-60/month Effectiveness of current meds-works well Adverse reactions form meds-none  Pill count performed-No Last drug screen -06/04/2022 ( high risk q63m, moderate risk q30m, low risk yearly ) Urine drug screen today- Yes Was the [facility] reviewed-yes  If yes were their any concerning findings? -None  No flowsheet data found.   Controlled substance contract signed on: Today  Hypertension Patient is currently on hydrochlorothiazide and olmesartan, and their blood pressure today is a lot of pain. Patient denies any lightheadedness or dizziness. Patient denies headaches, blurred vision, chest pains, shortness of breath, or weakness. Denies any side effects from medication and is content with current medication.   Hyperlipidemia Patient is coming in for recheck of his hyperlipidemia. The patient is currently taking Zetia and pravastatin. They deny any issues with myalgias or history of liver damage from it. They deny any focal numbness or weakness or chest pain.   She is still working with orthopedic and finding overall her hip pain after her fracture and still working with rehab as well.  Relevant past medical, surgical, family and social history reviewed and updated as indicated. Interim medical history since our last visit reviewed. Allergies and medications reviewed and updated.  Review of Systems  Constitutional:  Negative for chills and fever.  HENT:  Negative for congestion, ear discharge and ear pain.   Eyes:  Negative for redness and visual  disturbance.  Respiratory:  Negative for chest tightness and shortness of breath.   Cardiovascular:  Negative for chest pain and leg swelling.  Genitourinary:  Negative for difficulty urinating and dysuria.  Musculoskeletal:  Positive for arthralgias and gait problem. Negative for back pain.  Skin:  Negative for rash.  Neurological:  Negative for light-headedness and headaches.  Psychiatric/Behavioral:  Negative for agitation and behavioral problems.   All other systems reviewed and are negative.   Per HPI unless specifically indicated above   Allergies as of 06/30/2023       Reactions   Tetracyclines & Related Itching, Swelling        Medication List        Accurate as of June 30, 2023 11:03 AM. If you have any questions, ask your nurse or doctor.          ALPRAZolam 0.5 MG tablet Commonly known as: XANAX Take 1 tablet (0.5 mg total) by mouth 2 (two) times daily as needed for anxiety.   aspirin EC 81 MG tablet Take 1 tablet (81 mg total) by mouth 2 (two) times daily. Swallow whole.   calcium-vitamin D 500-200 MG-UNIT tablet Commonly known as: OSCAL WITH D Take 1 tablet by mouth daily with lunch.   citalopram 40 MG tablet Commonly known as: CELEXA Take 1 tablet (40 mg total) by mouth daily.   clobetasol cream 0.05 % Commonly known as: TEMOVATE Apply 1 Application topically 2 (two) times daily.   cyanocobalamin 1000 MCG/ML injection Commonly known as: VITAMIN B12 INJECT 1 ML INTO MUSCLE EVERY 28 DAYS  ergocalciferol 1.25 MG (50000 UT) capsule Commonly known as: VITAMIN D2 Take 1 capsule (50,000 Units total) by mouth once a week.   ezetimibe 10 MG tablet Commonly known as: ZETIA Take 1 tablet (10 mg total) by mouth daily.   folic acid 1 MG tablet Commonly known as: FOLVITE Take 1 mg by mouth daily.   hydrochlorothiazide 12.5 MG capsule Commonly known as: MICROZIDE Take 1 capsule (12.5 mg total) by mouth daily.   levETIRAcetam 250 MG  tablet Commonly known as: KEPPRA Take 250 mg by mouth at bedtime.   metroNIDAZOLE 0.75 % vaginal gel Commonly known as: METROGEL Place 1 Applicatorful vaginally 2 (two) times daily.   olmesartan 40 MG tablet Commonly known as: BENICAR Take 1 tablet (40 mg total) by mouth daily.   pantoprazole 40 MG tablet Commonly known as: PROTONIX Take 1 tablet (40 mg total) by mouth daily.   pravastatin 40 MG tablet Commonly known as: PRAVACHOL Take 1 tablet (40 mg total) by mouth every evening.   traMADol 50 MG tablet Commonly known as: ULTRAM Take 1 tablet (50 mg total) by mouth every 12 (twelve) hours as needed.         Objective:   BP (!) 156/69   Pulse (!) 58   Ht 5\' 5"  (1.651 m)   Wt 142 lb (64.4 kg)   SpO2 97%   BMI 23.63 kg/m   Wt Readings from Last 3 Encounters:  06/30/23 142 lb (64.4 kg)  03/31/23 144 lb (65.3 kg)  03/22/23 139 lb (63 kg)    Physical Exam Vitals and nursing note reviewed.  Constitutional:      General: She is not in acute distress.    Appearance: She is well-developed. She is not diaphoretic.  Eyes:     Conjunctiva/sclera: Conjunctivae normal.  Cardiovascular:     Rate and Rhythm: Normal rate and regular rhythm.     Heart sounds: Normal heart sounds. No murmur heard. Pulmonary:     Effort: Pulmonary effort is normal. No respiratory distress.     Breath sounds: Normal breath sounds. No wheezing.  Musculoskeletal:        General: No swelling.  Skin:    General: Skin is warm and dry.     Findings: No rash.  Neurological:     Mental Status: She is alert and oriented to person, place, and time.     Coordination: Coordination normal.  Psychiatric:        Behavior: Behavior normal.       Assessment & Plan:   Problem List Items Addressed This Visit       Cardiovascular and Mediastinum   Hypertension   Relevant Orders   BMP8+EGFR     Other   Hyperlipidemia with target LDL less than 100   Anxiety and depression - Primary    Relevant Medications   ALPRAZolam (XANAX) 0.5 MG tablet   citalopram (CELEXA) 40 MG tablet   Other Relevant Orders   BMP8+EGFR   Other Visit Diagnoses     Controlled substance agreement signed       Relevant Orders   ToxASSURE Select 13 (MW), Urine       Will do urine drug screen today  Patient will monitor her blood pressure closely over the next 2 weeks.  Likely elevated in the office because she is in a lot of pain with walking and getting around.  Refill current medicines. Follow up plan: Return in about 3 months (around 09/30/2023), or if symptoms worsen or  fail to improve, for Anxiety hypertension and cholesterol recheck.  Counseling provided for all of the vaccine components Orders Placed This Encounter  Procedures   ToxASSURE Select 13 (MW), Urine   BMP8+EGFR    Arville Care, MD [facility] 06/30/2023, 11:03 AM

## 2023-07-01 LAB — BMP8+EGFR
BUN/Creatinine Ratio: 17 (ref 12–28)
BUN: 12 mg/dL (ref 8–27)
CO2: 23 mmol/L (ref 20–29)
Calcium: 9.5 mg/dL (ref 8.7–10.3)
Chloride: 103 mmol/L (ref 96–106)
Creatinine, Ser: 0.7 mg/dL (ref 0.57–1.00)
Glucose: 95 mg/dL (ref 70–99)
Potassium: 4.1 mmol/L (ref 3.5–5.2)
Sodium: 141 mmol/L (ref 134–144)
eGFR: 92 mL/min/{1.73_m2} (ref >59)

## 2023-07-04 LAB — TOXASSURE SELECT 13 (MW), URINE

## 2023-07-06 NOTE — Progress Notes (Signed)
[facility] [address], [place] [address]   CLINIC:  Medical Oncology/Hematology  PCP:  Dettinger, Elige Radon, MD [address] [place] [address] [phone]   REASON FOR VISIT: Follow-up for normocytic anemia   CURRENT THERAPY: Intermittent IV iron; daily B12 and folic acid supplement  INTERVAL HISTORY:   [name] 72 y.o. female returns for routine follow-up of normocytic anemia.  She was last evaluated by Rojelio Brenner PA-C via telemedicine visit by NP Consuello Masse on 01/07/2023.   At today's visit, she reports that she is doing fair.  Since her last visit, she unfortunately sustained a fall in February 2024 and broke her right hip, which required surgical repair.  She is slowly recovering her mobility with the help of outpatient PT/OT.  She continues to have chronic fatigue that is unchanged. She has chronic dyspnea on exertion and occasional lightheaded episodes. She denies any major bleeding events such as hematemesis, hematochezia, melena, or epistaxis.  No chest pain or syncope.   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  She has generalized itching of her skin, but denies any aquagenic pruritus.  She has some tingling in her toes intermittently.  She denies any headaches, erythromelalgia or vasomotor symptoms.  She does not take any iron supplementation at home.  She is compliant with her folic acid pills and B12 shots.  She is taking weekly vitamin D.    She has little to no energy and 40% appetite. She endorses that she is maintaining a stable weight.  ASSESSMENT & PLAN:  1.   History of normocytic anemia, RESOLVED - Last colonoscopy (July 2021) was unremarkable - She reports recent EGD and colonoscopy via Dr. Dulce Wilson [facility] physicians in [place]), unable to find report in Epic - She last  received Feraheme infusion on 03/17/2020. - Denies any bleeding per rectum or melena.   - She is not taking any oral iron suuplement.   - Most recent labs (06/30/2023): Hgb 13.5, ferritin 71, iron saturation 84% - PLAN: No IV iron or intervention for anemia needed at this time.     2.  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  No vasomotor symptoms or erythromelalgia. 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  No further work-up or intervention planned at this time.   Repeat CBC in 1 year    3.  Vitamin D deficiency - Low vitamin D noted on labs from 10/22/2021, with vitamin D 14.6 - She is taking vitamin D 50,000 units weekly  - Most recent vitamin D (06/30/2023) normal at 61.3 - PLAN: Decrease vitamin D to 50,000 units every 2 weeks.   Recheck in 1 year    4.  Vitamin B12 deficiency: - She self administers vitamin B12 injections at home  once a month  - Most recent labs (06/30/2023): B12 normal but decreased from previous at 339, MMA pending  - PLAN: Continue B12 injections once a month, patient self administers at home.  Check B12 and methylmalonic acid in 1 year    5.  Folic acid deficiency: - Most recent folate (07/01/2022) normal at 16.1 with normal homocystine - She is taking folic acid 1 mg daily  - PLAN: Decrease folic acid to 161 mcg every other day.  Recheck in 1  year.   6.  Tobacco abuse: - She smoked 1 pack/day for 40 years. - Current smoking status: 0.5 PPD, trying to quit.   - CT lung cancer screening scan on 05/19/2021 was lung RADS 2. - We deferred low-dose CT chest from July 2023, as she received CT angio chest for monitoring of her aorta, no need to duplicate study  - CT angio chest (07/02/2022) showed "no pulmonary nodules, masses, or other abnormalities of the lungs/pleura." - PLAN: She is due for repeat LDCT scan in July 2024  7.  Macrocytosis, mild - Chronic for at least 3 years.  Stable.  Likely related to nutrient deficiencies. - PLAN: Continue monitoring.  PLAN SUMMARY: >> Please schedule for LDCT chest >> Labs in 1 year = CBC/D, CMP, ferritin, iron/TIBC, B12, MMA, vitamin D, folate >> OFFICE visit in 1 year (after labs)    REVIEW OF SYSTEMS:   Review of Systems  Constitutional:  Positive for fatigue. Negative for appetite change, chills, diaphoresis, fever and unexpected weight change.  HENT:   Negative for lump/mass and nosebleeds.   Eyes:  Negative for eye problems.  Respiratory:  Positive for shortness of breath (With exertion). Negative for cough and hemoptysis.   Cardiovascular:  Negative for chest pain, leg swelling and palpitations.  Gastrointestinal:  Negative for abdominal pain, blood in stool, constipation, diarrhea, nausea and vomiting.  Genitourinary:  Negative for hematuria.   Musculoskeletal:  Positive for arthralgias (Right hip).  Skin: Negative.   Neurological:  Positive for light-headedness and numbness. Negative for dizziness and headaches.  Hematological:  Does not bruise/bleed easily.     PHYSICAL EXAM:  ECOG PERFORMANCE STATUS: 2 - Symptomatic, <50% confined to bed  There were no vitals filed for this visit. There were no vitals filed for this visit. Physical Exam Constitutional:      Appearance: Normal appearance. She is normal weight.  Cardiovascular:     Heart sounds: Normal heart sounds.   Pulmonary:     Breath sounds: Normal breath sounds.  Neurological:     General: No focal deficit present.     Mental Status: Mental status is at baseline.     Gait: Gait abnormal (Antalgic gait, ambulates with cane).  Psychiatric:        Behavior: Behavior normal. Behavior is cooperative.     PAST MEDICAL/SURGICAL HISTORY:  Past Medical History:  Diagnosis Date   Anemia    Anxiety    Aortic atherosclerosis (HCC)    Carotid artery disease (HCC)    Depression    GERD (gastroesophageal reflux disease)    Hyperlipidemia    Hypertension    Orthostatic hypotension    Osteoporosis    Past Surgical History:  Procedure Laterality Date   ABDOMINAL HYSTERECTOMY     BACK SURGERY     CHOLECYSTECTOMY     FOOT SURGERY     7 in the past   HIP ARTHROPLASTY Right 02/11/2023   Procedure: ARTHROPLASTY BIPOLAR HIP (HEMIARTHROPLASTY);  Surgeon: Thane Edu  A, MD;  Location: [facility];  Service: Orthopedics;  Laterality: Right;   IR GENERIC HISTORICAL  12/09/2016   IR RADIOLOGIST EVAL & MGMT 12/09/2016 [facility]   IR GENERIC HISTORICAL  12/10/2016   IR VERTEBROPLASTY CERV/THOR BX INC UNI/BIL INC/INJECT/IMAGING 12/10/2016 [facility]   LEFT HEART CATH AND CORONARY ANGIOGRAPHY N/A 09/30/2020   Procedure: LEFT HEART CATH AND CORONARY ANGIOGRAPHY;  Surgeon: Elder Negus, MD;  Location: [facility]AS[facility];  Service: Cardiovascular;  Laterality: N/A;   TRACHEOSTOMY TUBE PLACEMENT  1982    SOCIAL HISTORY:  Social History   Socioeconomic History   Marital status: Married    Spouse name: Donnie   Number of children: 2   Years of education: 12   Highest education level: Associate degree: occupational, Scientist, product/process development, or vocational program  Occupational History   Occupation: retired    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  Tobacco Use   Smoking status: Former    Current packs/day: 0.00    Average packs/day: 0.3 packs/day for 25.0 years  (6.3 ttl pk-yrs)    Types: Cigarettes    Start date: 02/1998    Quit date: 02/2023    Years since quitting: 0.3    Passive exposure: Never   Smokeless tobacco: Never   Tobacco comments:    Trying to cut on number of cigarettes she smokes.  Vaping Use   Vaping status: Never Used  Substance and Sexual Activity   Alcohol use: Not Currently    Comment: occassionally    Drug use: No   Sexual activity: Not on file  Other Topics Concern   Not on file  Social History Narrative   Lives with her husband, Moise Boring, and has two grown sons and two grand sons.   She visits family and has a once weekly outing with a friend for socializing.  She enjoys making beaded jewelry, reading and walking.   Social Determinants of Health   Financial Resource Strain: Low Risk  (12/24/2022)   Overall Financial Resource Strain (CARDIA)    Difficulty of Paying Living Expenses: Not hard at all  Food Insecurity: No Food Insecurity (02/10/2023)   Hunger Vital Sign    Worried About Running Out of Food in the Last Year: Never true    Ran Out of Food in the Last Year: Never true  Transportation Needs: No Transportation Needs (02/10/2023)   PRAPARE - Administrator, Civil Service (Medical): No    Lack of Transportation (Non-Medical): No  Physical Activity: Inactive (12/24/2022)   Exercise Vital Sign    Days of Exercise per Week: 0 days    Minutes of Exercise per Session: 0 min  Stress: No Stress Concern Present (12/24/2022)   [employer] of Occupational Health - Occupational Stress Questionnaire    Feeling of Stress : Not at all  Social Connections: Socially Integrated (12/24/2022)   Social Connection and Isolation Panel [NHANES]    Frequency of Communication with Friends and Family: More than three times a week    Frequency of Social Gatherings with Friends and Family: More than three times a week    Attends Religious Services: More than 4 times per year    Active Member of [employer] or Organizations: Yes     Attends Banker Meetings: More than 4 times per year    Marital Status: Married  Catering manager Violence: Not At Risk (02/10/2023)   Humiliation, Afraid, Rape, and Kick questionnaire  Fear of Current or Ex-Partner: No    Emotionally Abused: No    Physically Abused: No    Sexually Abused: No    FAMILY HISTORY:  Family History  Problem Relation Age of Onset   Heart disease Mother    Hypertension Mother    Hyperlipidemia Mother    Diabetes Mother    Congestive Heart Failure Mother    Aneurysm Paternal Uncle    Heart attack Maternal Grandmother    Heart attack Maternal Grandfather    Hypertension Son    Other Son 51       Kienbock's Disease   Liver disease Son 43       Gilbert's Disease   Lupus Half-Sister    Atrial fibrillation Half-Sister    Breast cancer Niece     CURRENT MEDICATIONS:  Outpatient Encounter Medications as of 07/07/2023  Medication Sig   ALPRAZolam (XANAX) 0.5 MG tablet Take 1 tablet (0.5 mg total) by mouth 2 (two) times daily as needed for anxiety.   aspirin EC 81 MG tablet Take 1 tablet (81 mg total) by mouth 2 (two) times daily. Swallow whole.   calcium-vitamin D (OSCAL WITH D) 500-200 MG-UNIT tablet Take 1 tablet by mouth daily with lunch.    citalopram (CELEXA) 40 MG tablet Take 1 tablet (40 mg total) by mouth daily.   clobetasol cream (TEMOVATE) 0.05 % Apply 1 Application topically 2 (two) times daily.   cyanocobalamin (VITAMIN B12) 1000 MCG/ML injection INJECT 1 ML INTO MUSCLE EVERY 28 DAYS   ergocalciferol (VITAMIN D2) 1.25 MG (50000 UT) capsule Take 1 capsule (50,000 Units total) by mouth once a week.   ezetimibe (ZETIA) 10 MG tablet Take 1 tablet (10 mg total) by mouth daily.   folic acid (FOLVITE) 1 MG tablet Take 1 mg by mouth daily.   hydrochlorothiazide (MICROZIDE) 12.5 MG capsule Take 1 capsule (12.5 mg total) by mouth daily.   levETIRAcetam (KEPPRA) 250 MG tablet Take 250 mg by mouth at bedtime.   metroNIDAZOLE (METROGEL)  0.75 % vaginal gel Place 1 Applicatorful vaginally 2 (two) times daily.   olmesartan (BENICAR) 40 MG tablet Take 1 tablet (40 mg total) by mouth daily.   pantoprazole (PROTONIX) 40 MG tablet Take 1 tablet (40 mg total) by mouth daily.   pravastatin (PRAVACHOL) 40 MG tablet Take 1 tablet (40 mg total) by mouth every evening.   traMADol (ULTRAM) 50 MG tablet Take 1 tablet (50 mg total) by mouth every 12 (twelve) hours as needed.   No facility-administered encounter medications on file as of 07/07/2023.    ALLERGIES:  Allergies  Allergen Reactions   Tetracyclines & Related Itching and Swelling    LABORATORY DATA:  I have reviewed the labs as listed.  CBC    Component Value Date/Time   WBC 6.0 06/30/2023 1124   WBC 7.4 02/12/2023 0449   RBC 3.97 06/30/2023 1124   RBC 3.37 (L) 02/12/2023 0449   HGB 13.5 06/30/2023 1124   HCT 39.8 06/30/2023 1124   PLT 208 06/30/2023 1124   MCV 100 (H) 06/30/2023 1124   MCH 34.0 (H) 06/30/2023 1124   MCH 35.6 (H) 02/12/2023 0449   MCHC 33.9 06/30/2023 1124   MCHC 33.9 02/12/2023 0449   RDW 12.4 06/30/2023 1124   LYMPHSABS 2.0 06/30/2023 1124   MONOABS 0.5 02/09/2023 1650   EOSABS 0.1 06/30/2023 1124   BASOSABS 0.0 06/30/2023 1124      Latest Ref Rng & Units 06/30/2023   11:29 AM 06/30/2023  11:24 AM 03/31/2023   10:32 AM  CMP  Glucose 70 - 99 mg/dL 95  97  97   BUN 8 - 27 mg/dL 12  12  12    Creatinine 0.57 - 1.00 mg/dL 1.19  1.47  8.29   Sodium 134 - 144 mmol/L 141  139  140   Potassium 3.5 - 5.2 mmol/L 4.1  4.3  4.4   Chloride 96 - 106 mmol/L 103  102  103   CO2 20 - 29 mmol/L 23  24  23    Calcium 8.7 - 10.3 mg/dL 9.5  9.6  9.2   Total Protein 6.0 - 8.5 g/dL  6.3  6.2   Total Bilirubin 0.0 - 1.2 mg/dL  1.2  0.6   Alkaline Phos 44 - 121 IU/L  92  94   AST 0 - 40 IU/L  20  11   ALT 0 - 32 IU/L  10  8     DIAGNOSTIC IMAGING:  I have independently reviewed the relevant imaging and discussed with the patient.   WRAP UP:  All  questions were answered. The patient knows to call the clinic with any problems, questions or concerns.  Medical decision making: Moderate  Time spent on visit: I spent 20 minutes counseling the patient face to face. The total time spent in the appointment was 30 minutes and more than 50% was on counseling.  Carnella Guadalajara, PA-C  07/07/23 2:50 PM

## 2023-07-07 ENCOUNTER — Inpatient Hospital Stay: Payer: Medicare Other | Attending: Physician Assistant | Admitting: Physician Assistant

## 2023-07-07 VITALS — BP 119/81 | HR 64 | Temp 97.6°F | Resp 16 | Wt 137.8 lb

## 2023-07-07 DIAGNOSIS — Z803 Family history of malignant neoplasm of breast: Secondary | ICD-10-CM | POA: Insufficient documentation

## 2023-07-07 DIAGNOSIS — R42 Dizziness and giddiness: Secondary | ICD-10-CM | POA: Insufficient documentation

## 2023-07-07 DIAGNOSIS — D649 Anemia, unspecified: Secondary | ICD-10-CM | POA: Diagnosis not present

## 2023-07-07 DIAGNOSIS — D509 Iron deficiency anemia, unspecified: Secondary | ICD-10-CM

## 2023-07-07 DIAGNOSIS — Z87891 Personal history of nicotine dependence: Secondary | ICD-10-CM | POA: Diagnosis not present

## 2023-07-07 DIAGNOSIS — I10 Essential (primary) hypertension: Secondary | ICD-10-CM | POA: Insufficient documentation

## 2023-07-07 DIAGNOSIS — D7589 Other specified diseases of blood and blood-forming organs: Secondary | ICD-10-CM | POA: Insufficient documentation

## 2023-07-07 DIAGNOSIS — R5383 Other fatigue: Secondary | ICD-10-CM | POA: Diagnosis not present

## 2023-07-07 DIAGNOSIS — Z8349 Family history of other endocrine, nutritional and metabolic diseases: Secondary | ICD-10-CM | POA: Diagnosis not present

## 2023-07-07 DIAGNOSIS — Z8379 Family history of other diseases of the digestive system: Secondary | ICD-10-CM | POA: Diagnosis not present

## 2023-07-07 DIAGNOSIS — F419 Anxiety disorder, unspecified: Secondary | ICD-10-CM | POA: Diagnosis not present

## 2023-07-07 DIAGNOSIS — K219 Gastro-esophageal reflux disease without esophagitis: Secondary | ICD-10-CM | POA: Diagnosis not present

## 2023-07-07 DIAGNOSIS — Z833 Family history of diabetes mellitus: Secondary | ICD-10-CM | POA: Diagnosis not present

## 2023-07-07 DIAGNOSIS — R200 Anesthesia of skin: Secondary | ICD-10-CM | POA: Diagnosis not present

## 2023-07-07 DIAGNOSIS — Z9049 Acquired absence of other specified parts of digestive tract: Secondary | ICD-10-CM | POA: Insufficient documentation

## 2023-07-07 DIAGNOSIS — E538 Deficiency of other specified B group vitamins: Secondary | ICD-10-CM | POA: Insufficient documentation

## 2023-07-07 DIAGNOSIS — Z832 Family history of diseases of the blood and blood-forming organs and certain disorders involving the immune mechanism: Secondary | ICD-10-CM | POA: Diagnosis not present

## 2023-07-07 DIAGNOSIS — R0602 Shortness of breath: Secondary | ICD-10-CM | POA: Insufficient documentation

## 2023-07-07 DIAGNOSIS — M2550 Pain in unspecified joint: Secondary | ICD-10-CM | POA: Diagnosis not present

## 2023-07-07 DIAGNOSIS — Z90710 Acquired absence of both cervix and uterus: Secondary | ICD-10-CM | POA: Diagnosis not present

## 2023-07-07 DIAGNOSIS — Z8249 Family history of ischemic heart disease and other diseases of the circulatory system: Secondary | ICD-10-CM | POA: Insufficient documentation

## 2023-07-07 DIAGNOSIS — E785 Hyperlipidemia, unspecified: Secondary | ICD-10-CM | POA: Diagnosis not present

## 2023-07-07 DIAGNOSIS — Z79899 Other long term (current) drug therapy: Secondary | ICD-10-CM | POA: Insufficient documentation

## 2023-07-07 DIAGNOSIS — Z122 Encounter for screening for malignant neoplasm of respiratory organs: Secondary | ICD-10-CM

## 2023-07-07 DIAGNOSIS — D751 Secondary polycythemia: Secondary | ICD-10-CM

## 2023-07-07 DIAGNOSIS — R7989 Other specified abnormal findings of blood chemistry: Secondary | ICD-10-CM

## 2023-07-07 DIAGNOSIS — E559 Vitamin D deficiency, unspecified: Secondary | ICD-10-CM | POA: Diagnosis not present

## 2023-07-07 MED ORDER — ERGOCALCIFEROL 1.25 MG (50000 UT) PO CAPS: 50000.0000 [IU] | ORAL_CAPSULE | ORAL | 1 refills | Status: DC

## 2023-07-07 NOTE — Progress Notes (Signed)
Attempted to reach patient to schedule follow-up LDCT. Unable to reach patient directly. Details VM left asking that the patient return my call.

## 2023-07-07 NOTE — Patient Instructions (Signed)
[facility] at [facility] **VISIT SUMMARY & IMPORTANT INSTRUCTIONS **   You were seen today by Rojelio Brenner PA-C for your follow-up visit.    VITAMIN DEFICIENCIES Your iron levels look good.  You do not need any iron supplement at this time. Your vitamin B12 levels look good.  Continue your vitamin B12 injections once a month. Your folic acid levels look good.  You can DECREASE your folic acid so that you are taking 400 mcg every other day (instead of daily).  This is available over-the-counter. Your vitamin D levels look good, but are on the higher end of normal.  You can DECREASE your vitamin D so that you are taking it every 2 weeks (instead of weekly).  ELEVATED RED BLOOD CELLS Your hemoglobin and red blood cells are back to normal after you cut back on smoking.  TOBACCO USE Keep up the good work on cutting back on your cigarettes! You are due for your once a year CT scan of your chest to screen for lung cancer.  We will get this set up within the next 1 to 2 months.  FOLLOW-UP APPOINTMENT: Labs and office visit in 1 year.  ** Thank you for trusting me with your healthcare!  I strive to provide all of my patients with quality care at each visit.  If you receive a survey for this visit, I would be so grateful to you for taking the time to provide feedback.  Thank you in advance!  ~ Navi Erber                   Dr. Doreatha Massed   &   Rojelio Brenner, PA-C   - - - - - - - - - - - - - - - - - -    Thank you for choosing [facility] at [facility] to provide your oncology and hematology care.  To afford each patient quality time with our provider, please arrive at least 15 minutes before your scheduled appointment time.   If you have a lab appointment with the Cancer Center please come in thru the Main Entrance and check in at the main information desk.  You need to re-schedule your appointment should you arrive 10 or more  minutes late.  We strive to give you quality time with our providers, and arriving late affects you and other patients whose appointments are after yours.  Also, if you no show three or more times for appointments you may be dismissed from the clinic at the providers discretion.     Again, thank you for choosing [facility].  Our hope is that these requests will decrease the amount of time that you wait before being seen by our physicians.       _____________________________________________________________  Should you have questions after your visit to [facility], please contact our office at [phone] and follow the prompts.  Our office hours are 8:00 a.m. and 4:30 p.m. Monday - Friday.  Please note that voicemails left after 4:00 p.m. may not be returned until the following business day.  We are closed weekends and major holidays.  You do have access to a nurse 24-7, just call the main number to the clinic [phone] and do not press any options, hold on the line and a nurse will answer the phone.    For prescription refill requests, have your pharmacy contact our office and allow 72 hours.

## 2023-07-08 LAB — CBC WITH DIFFERENTIAL/PLATELET
EOS (ABSOLUTE): 0.1 10*3/uL (ref 0.0–0.4)
Eos: 1 %
Hematocrit: 39.8 % (ref 34.0–46.6)
Hemoglobin: 13.5 g/dL (ref 11.1–15.9)
Lymphocytes Absolute: 2 10*3/uL (ref 0.7–3.1)
Lymphs: 34 %
MCHC: 33.9 g/dL (ref 31.5–35.7)
Monocytes Absolute: 0.5 10*3/uL (ref 0.1–0.9)
Monocytes: 8 %
Neutrophils Absolute: 3.4 10*3/uL (ref 1.4–7.0)
Neutrophils: 57 %
Platelets: 208 10*3/uL (ref 150–450)
RDW: 12.4 % (ref 11.7–15.4)

## 2023-07-08 LAB — COMPREHENSIVE METABOLIC PANEL
AST: 20 IU/L (ref 0–40)
Albumin: 3.9 g/dL (ref 3.8–4.8)
BUN: 12 mg/dL (ref 8–27)
Creatinine, Ser: 0.65 mg/dL (ref 0.57–1.00)
Potassium: 4.3 mmol/L (ref 3.5–5.2)
Total Protein: 6.3 g/dL (ref 6.0–8.5)
eGFR: 94 mL/min/{1.73_m2} (ref >59)

## 2023-07-08 LAB — IRON AND TIBC
Iron: 223 ug/dL — ABNORMAL HIGH (ref 27–139)
UIBC: 44 ug/dL — ABNORMAL LOW (ref 118–369)

## 2023-07-08 LAB — METHYLMALONIC ACID, SERUM

## 2023-07-08 LAB — VITAMIN D 25 HYDROXY (VIT D DEFICIENCY, FRACTURES): Vit D, 25-Hydroxy: 61.3 ng/mL (ref 30.0–100.0)

## 2023-07-08 LAB — VITAMIN B12: Vitamin B-12: 339 pg/mL (ref 232–1245)

## 2023-07-11 LAB — IRON AND TIBC
Iron Saturation: 84 % (ref 15–55)
Total Iron Binding Capacity: 267 ug/dL (ref 250–450)

## 2023-07-11 LAB — CBC WITH DIFFERENTIAL/PLATELET
Basophils Absolute: 0 10*3/uL (ref 0.0–0.2)
Basos: 0 %
Immature Grans (Abs): 0 10*3/uL (ref 0.0–0.1)
Immature Granulocytes: 0 %
MCH: 34 pg — ABNORMAL HIGH (ref 26.6–33.0)
MCV: 100 fL — ABNORMAL HIGH (ref 79–97)
RBC: 3.97 x10E6/uL (ref 3.77–5.28)
WBC: 6 10*3/uL (ref 3.4–10.8)

## 2023-07-11 LAB — COMPREHENSIVE METABOLIC PANEL
ALT: 10 IU/L (ref 0–32)
Alkaline Phosphatase: 92 IU/L (ref 44–121)
BUN/Creatinine Ratio: 18 (ref 12–28)
Bilirubin Total: 1.2 mg/dL (ref 0.0–1.2)
CO2: 24 mmol/L (ref 20–29)
Calcium: 9.6 mg/dL (ref 8.7–10.3)
Chloride: 102 mmol/L (ref 96–106)
Globulin, Total: 2.4 g/dL (ref 1.5–4.5)
Glucose: 97 mg/dL (ref 70–99)
Sodium: 139 mmol/L (ref 134–144)

## 2023-07-11 LAB — FERRITIN: Ferritin: 71 ng/mL (ref 15–150)

## 2023-07-11 LAB — FOLATE: Folate: >20 ng/mL (ref >3.0)

## 2023-08-12 ENCOUNTER — Ambulatory Visit (INDEPENDENT_AMBULATORY_CARE_PROVIDER_SITE_OTHER): Payer: Medicare Other | Admitting: Orthopedic Surgery

## 2023-08-12 ENCOUNTER — Encounter: Payer: Self-pay | Admitting: Orthopedic Surgery

## 2023-08-12 ENCOUNTER — Other Ambulatory Visit: Payer: Self-pay

## 2023-08-12 VITALS — BP 137/80 | HR 58 | Ht 65.0 in | Wt 142.8 lb

## 2023-08-12 DIAGNOSIS — Z96649 Presence of unspecified artificial hip joint: Secondary | ICD-10-CM

## 2023-08-12 NOTE — Progress Notes (Signed)
Orthopaedic Postop Note  Assessment: Jody Wilson is a 72 y.o. female s/p Right hip hemiarthroplasty  DOS: 02/11/2023  Plan: [name] continues to improve.  Occasional pain in her groin.  She still has some weakness and stiffness.  Urged her to continue walking as therapy.  Cane or a walking stick if walking longer distances.  Anticipate gradual continued improvement.  This was discussed with the patient.  I would like see her back in approximately 6 months.   Follow-up: Return in about 6 months (around 02/12/2024). XR at next visit: AP pelvis and Right hip  Subjective:  Chief Complaint  Patient presents with   Routine Post Op    R hip DOS 02/11/23. Pt states she's still having significant tightness and can't lift her leg high enough to put socks on.     History of Present Illness: Jody Wilson is a 72 y.o. female who presents following the above stated procedure.  Surgery was 6 months ago.  She continues to have occasional pain in her groin.  She notes that she is unable to cross her legs.  She has difficulty flexing her hip up enough to put her socks on.  Occasional pain in the lateral hip.  She will take Tylenol or ibuprofen as needed.  She does not take pain medication on a daily basis.   Review of Systems: No fevers or chills No numbness or tingling No Chest Pain No shortness of breath   Objective: BP 137/80   Pulse (!) 58   Ht 5\' 5"  (1.651 m)   Wt 142 lb 12.8 oz (64.8 kg)   BMI 23.76 kg/m   Physical Exam:  Alert and oriented.  No acute distress.  Steady gait without assistive device.  Evaluation of left hip demonstrates a healed surgical incision.  Mild tenderness to palpation laterally.  No surrounding erythema or drainage.  She tolerates gentle range of motion of her hip.  She is able to maintain a straight leg raise.  No pain with axial loading.  Toes are warm and well-perfused.  Active motion intact in the EHL/TA.  IMAGING: I personally ordered and  reviewed the following images:  AP pelvis and right hip x-rays were obtained in clinic today.  These are compared to prior x-rays.  No acute injuries.  Hip joint is reduced.  Hip prosthesis remains in stable position.  No fracture lines.  No periprosthetic lucency.  There has been no subsidence of the implant.  Impression: Right hip hemiarthroplasty in stable position, without subsidence.  Oliver Barre, MD  08/12/2023 12:06 PM

## 2023-09-28 ENCOUNTER — Other Ambulatory Visit: Payer: Self-pay | Admitting: Nurse Practitioner

## 2023-09-28 ENCOUNTER — Encounter: Payer: Self-pay | Admitting: Nurse Practitioner

## 2023-09-28 ENCOUNTER — Ambulatory Visit (INDEPENDENT_AMBULATORY_CARE_PROVIDER_SITE_OTHER): Payer: Medicare Other

## 2023-09-28 ENCOUNTER — Encounter (HOSPITAL_COMMUNITY): Payer: Self-pay

## 2023-09-28 ENCOUNTER — Emergency Department (HOSPITAL_COMMUNITY): Payer: Medicare Other

## 2023-09-28 ENCOUNTER — Other Ambulatory Visit: Payer: Self-pay

## 2023-09-28 ENCOUNTER — Ambulatory Visit: Payer: Medicare Other | Admitting: Nurse Practitioner

## 2023-09-28 ENCOUNTER — Emergency Department (HOSPITAL_COMMUNITY)
Admission: EM | Admit: 2023-09-28 | Discharge: 2023-09-28 | Disposition: A | Discharge status: Home / Self Care | Payer: Medicare Other

## 2023-09-28 VITALS — BP 149/81 | HR 61 | Temp 97.9°F | Ht 65.0 in | Wt 139.0 lb

## 2023-09-28 DIAGNOSIS — W19XXXA Unspecified fall, initial encounter: Secondary | ICD-10-CM | POA: Diagnosis not present

## 2023-09-28 DIAGNOSIS — K449 Diaphragmatic hernia without obstruction or gangrene: Secondary | ICD-10-CM | POA: Diagnosis not present

## 2023-09-28 DIAGNOSIS — S20211A Contusion of right front wall of thorax, initial encounter: Secondary | ICD-10-CM

## 2023-09-28 DIAGNOSIS — S060X1A Concussion with loss of consciousness of 30 minutes or less, initial encounter: Secondary | ICD-10-CM

## 2023-09-28 DIAGNOSIS — S4991XA Unspecified injury of right shoulder and upper arm, initial encounter: Secondary | ICD-10-CM | POA: Diagnosis present

## 2023-09-28 DIAGNOSIS — W01198A Fall on same level from slipping, tripping and stumbling with subsequent striking against other object, initial encounter: Secondary | ICD-10-CM | POA: Insufficient documentation

## 2023-09-28 DIAGNOSIS — Z79899 Other long term (current) drug therapy: Secondary | ICD-10-CM | POA: Diagnosis not present

## 2023-09-28 DIAGNOSIS — S0990XA Unspecified injury of head, initial encounter: Secondary | ICD-10-CM | POA: Diagnosis not present

## 2023-09-28 DIAGNOSIS — S199XXA Unspecified injury of neck, initial encounter: Secondary | ICD-10-CM | POA: Diagnosis not present

## 2023-09-28 DIAGNOSIS — I700 Atherosclerosis of aorta: Secondary | ICD-10-CM | POA: Diagnosis not present

## 2023-09-28 DIAGNOSIS — Z981 Arthrodesis status: Secondary | ICD-10-CM | POA: Diagnosis not present

## 2023-09-28 DIAGNOSIS — Z9181 History of falling: Secondary | ICD-10-CM

## 2023-09-28 DIAGNOSIS — R0789 Other chest pain: Secondary | ICD-10-CM | POA: Insufficient documentation

## 2023-09-28 DIAGNOSIS — S42021A Displaced fracture of shaft of right clavicle, initial encounter for closed fracture: Secondary | ICD-10-CM | POA: Insufficient documentation

## 2023-09-28 DIAGNOSIS — S42031A Displaced fracture of lateral end of right clavicle, initial encounter for closed fracture: Secondary | ICD-10-CM | POA: Diagnosis not present

## 2023-09-28 DIAGNOSIS — Z7982 Long term (current) use of aspirin: Secondary | ICD-10-CM | POA: Insufficient documentation

## 2023-09-28 DIAGNOSIS — I6782 Cerebral ischemia: Secondary | ICD-10-CM | POA: Diagnosis not present

## 2023-09-28 DIAGNOSIS — J432 Centrilobular emphysema: Secondary | ICD-10-CM | POA: Diagnosis not present

## 2023-09-28 MED ORDER — LIDOCAINE 5 % EX PTCH
1.0000 | MEDICATED_PATCH | Freq: Once | CUTANEOUS | Status: DC
  Administered 2023-09-28: 1 via TRANSDERMAL
  Filled 2023-09-28: qty 1

## 2023-09-28 MED ORDER — LIDOCAINE 4 % EX PTCH: 1.0000 | MEDICATED_PATCH | CUTANEOUS | 0 refills | Status: DC

## 2023-09-28 MED ORDER — OXYCODONE-ACETAMINOPHEN 5-325 MG PO TABS
1.0000 | ORAL_TABLET | Freq: Once | ORAL | Status: AC
  Administered 2023-09-28: 1 via ORAL
  Filled 2023-09-28: qty 1

## 2023-09-28 MED ORDER — OXYCODONE-ACETAMINOPHEN 5-325 MG PO TABS
1.0000 | ORAL_TABLET | Freq: Four times a day (QID) | ORAL | 0 refills | Status: DC | PRN
Start: 2023-09-28 — End: 2023-10-11

## 2023-09-28 NOTE — ED Provider Notes (Signed)
[facility] AT [facility] Provider Note   CSN: [id number] Arrival date & time: 09/28/23  1125     History  Chief Complaint  Patient presents with   Marletta Lor    Jody Wilson is a 72 y.o. female.   Had a fall on Monday.  Mechanical.  Hit her head, LOC.  Has had pain to her right shoulder.  No headache, vision changes, facial droop.  Is having chest wall pain on the right side as well.  No shortness of breath.  No abdominal pain.  No numbness tingling changes in sensation in her upper extremities.  She reportedly went to outside hospital had x-rays done that showed clavicle fracture and sent to the emergency department for further imaging.   Fall       Home Medications Prior to Admission medications   Medication Sig Start Date End Date Taking? Authorizing Provider  lidocaine 4 % Place 1 patch onto the skin daily. 09/28/23  Yes Coral Spikes, DO  oxyCODONE-acetaminophen (PERCOCET/ROXICET) 5-325 MG tablet Take 1 tablet by mouth every 6 (six) hours as needed for severe pain. 09/28/23  Yes Venetia Prewitt, Harmon Dun, DO  ALPRAZolam Prudy Feeler) 0.5 MG tablet Take 1 tablet (0.5 mg total) by mouth 2 (two) times daily as needed for anxiety. 06/30/23   Dettinger, Elige Radon, MD  aspirin EC 81 MG tablet Take 1 tablet (81 mg total) by mouth 2 (two) times daily. Swallow whole. 02/13/23   Lanae Boast, MD  calcium-vitamin D (OSCAL WITH D) 500-200 MG-UNIT tablet Take 1 tablet by mouth daily with lunch.     [provider]  citalopram (CELEXA) 40 MG tablet Take 1 tablet (40 mg total) by mouth daily. 06/30/23   Dettinger, Elige Radon, MD  clobetasol cream (TEMOVATE) 0.05 % Apply 1 Application topically 2 (two) times daily.    [provider]  cyanocobalamin (VITAMIN B12) 1000 MCG/ML injection INJECT 1 ML INTO MUSCLE EVERY 28 DAYS 11/25/22   Doreatha Massed, MD  ergocalciferol (VITAMIN D2) 1.25 MG (50000 UT) capsule Take 1 capsule (50,000 Units total) by mouth every 14  (fourteen) days. 07/07/23   Carnella Guadalajara, PA-C  ezetimibe (ZETIA) 10 MG tablet Take 1 tablet (10 mg total) by mouth daily. 12/30/22   Dettinger, Elige Radon, MD  folic acid (FOLVITE) 1 MG tablet Take 1 mg by mouth daily.    [provider]  hydrochlorothiazide (MICROZIDE) 12.5 MG capsule Take 1 capsule (12.5 mg total) by mouth daily. 03/01/23   Raliegh Ip, DO  levETIRAcetam (KEPPRA) 250 MG tablet Take 250 mg by mouth at bedtime.    [provider]  metroNIDAZOLE (METROGEL) 0.75 % vaginal gel Place 1 Applicatorful vaginally 2 (two) times daily.    [provider]  olmesartan (BENICAR) 40 MG tablet Take 1 tablet (40 mg total) by mouth daily. Patient taking differently: Take 40 mg by mouth daily. 20 mg am and 20 mg pm 03/01/23   Delynn Flavin M, DO  pantoprazole (PROTONIX) 40 MG tablet Take 1 tablet (40 mg total) by mouth daily. 03/01/23   Raliegh Ip, DO  pravastatin (PRAVACHOL) 40 MG tablet Take 1 tablet (40 mg total) by mouth every evening. 03/01/23   Raliegh Ip, DO      Allergies    Tetracyclines & related    Review of Systems   Review of Systems  Physical Exam Updated Vital Signs BP (!) 163/101 (BP Location: Left Arm)   Pulse 62  Temp 97.9 F (36.6 C) (Oral)   Resp 18   Ht 5\' 5"  (1.651 m)   Wt 59.9 kg   SpO2 91%   BMI 21.97 kg/m  Physical Exam Vitals and nursing note reviewed.  Constitutional:      General: She is not in acute distress.    Appearance: She is not toxic-appearing.  HENT:     Head: Normocephalic and atraumatic.     Nose: Nose normal.     Mouth/Throat:     Mouth: Mucous membranes are dry.  Eyes:     Conjunctiva/sclera: Conjunctivae normal.  Cardiovascular:     Rate and Rhythm: Normal rate and regular rhythm.  Pulmonary:     Effort: Pulmonary effort is normal.     Breath sounds: Normal breath sounds.  Abdominal:     General: Abdomen is flat. There is no distension.     Tenderness: There is no  abdominal tenderness. There is no guarding or rebound.  Musculoskeletal:     Comments: Patient with extensive bruising to the right shoulder and anterior chest wall.  Tenderness to palpation diffusely over shoulder.  And clavicle.  Good grip strength.  Neurovascular intact in upper extremities.  No other tenderness.  Pelvis stable nontender.  No bony tenderness to the lower extremities.  Neurological:     General: No focal deficit present.     Mental Status: She is alert.  Psychiatric:        Mood and Affect: Mood normal.        Behavior: Behavior normal.     ED Results / Procedures / Treatments   Labs (all labs ordered are listed, but only abnormal results are displayed) Labs Reviewed - No data to display  EKG None  Radiology CT Chest Wo Contrast  Result Date: 09/28/2023 CLINICAL DATA:  Fall on Monday. Right-sided chest and shoulder pain. EXAM: CT CHEST WITHOUT CONTRAST TECHNIQUE: Multidetector CT imaging of the chest was performed following the standard protocol without IV contrast. RADIATION DOSE REDUCTION: This exam was performed according to the departmental dose-optimization program which includes automated exposure control, adjustment of the mA and/or kV according to patient size and/or use of iterative reconstruction technique. COMPARISON:  CTA chest dated July 02, 2022 FINDINGS: Cardiovascular: Normal heart size. No No pericardial effusion. Normal caliber thoracic aorta with severe atherosclerotic disease. 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 Mediastinum/Nodes: Large hiatal hernia. Thyroid is unremarkable. No No enlarged lymph nodes seen in the chest. Lungs/Pleura: Central airways are patent. Mild centrilobular emphysema. Unchanged right-greater-than-left apical pleural-parenchymal scarring. No  consolidation, pleural effusion or pneumothorax. Upper Abdomen: No acute abnormality. Musculoskeletal: Mild subcutaneous soft tissue stranding of the right breast and right chest wall. Mildly displaced fracture of the mid right clavicle. New mild sclerosis of the sternum, likely sequela of prior trauma. Mild deformity of the superior endplate of T5, unchanged. Moderate compression deformity of T11 with kyphoplasty changes, unchanged. Old fracture of the right anterior third rib. IMPRESSION: 1. Mildly displaced fracture of the mid right clavicle. 2. No pleural effusion or pneumothorax. 3. Mild subcutaneous soft tissue stranding of the right breast and right chest wall, likely due to contusion. 4. 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 5. Large hiatal hernia. 6. Aortic Atherosclerosis (ICD10-I70.0) and Emphysema (ICD10-J43.9). Electronically Signed   By: Allegra Lai M.D.   On: 09/28/2023 14:45   CT Head Wo Contrast  Result Date: 09/28/2023 CLINICAL DATA:  Head trauma, minor (Age >= 65y); Neck trauma (Age >= 65y) EXAM: CT HEAD WITHOUT CONTRAST CT CERVICAL SPINE WITHOUT CONTRAST TECHNIQUE: Multidetector CT imaging of the head and cervical spine was performed following the standard protocol without intravenous contrast. Multiplanar CT image reconstructions of the cervical spine were also generated. RADIATION DOSE REDUCTION: This exam was performed according to the departmental dose-optimization program which includes automated exposure control, adjustment of the mA and/or kV according to patient size and/or use of iterative reconstruction technique. COMPARISON:  None Available. FINDINGS: CT HEAD FINDINGS Brain: No evidence of acute infarction, hemorrhage, hydrocephalus, extra-axial collection or mass lesion/mass effect. Sequela of mild  chronic microvascular ischemic change. Vascular: No hyperdense vessel or unexpected calcification. Skull: Normal. Negative for fracture or focal lesion. Sinuses/Orbits: 2 no middle ear or mastoid effusion. Paranasal sinuses are notable for mucosal thickening in the left sphenoid sinus. Orbits are unremarkable. Other: None. CT CERVICAL SPINE FINDINGS Alignment: Trace anterolisthesis of C7 on T1. Skull base and vertebrae: No acute fracture. No primary bone lesion or focal pathologic process.Status post C5-C7 ACDF. Soft tissues and spinal canal: No prevertebral fluid or swelling. No visible canal hematoma. Disc levels:  No evidence of high-grade spinal canal stenosis. Upper chest: Right apical pleural-parenchymal scarring. Other: None IMPRESSION: 1. No acute intracranial abnormality. 2. No acute fracture or traumatic malalignment of the cervical spine. Electronically Signed   By: Lorenza Cambridge M.D.   On: 09/28/2023 14:17   CT Cervical Spine Wo Contrast  Result Date: 09/28/2023 CLINICAL DATA:  Head trauma, minor (Age >= 65y); Neck trauma (Age >= 65y) EXAM: CT HEAD WITHOUT CONTRAST CT CERVICAL SPINE WITHOUT CONTRAST TECHNIQUE: Multidetector CT imaging of the head and cervical spine was performed following the standard protocol without intravenous contrast. Multiplanar CT image reconstructions of the cervical spine were also generated. RADIATION DOSE REDUCTION: This exam was performed according to the departmental dose-optimization program which includes automated exposure control, adjustment of the mA and/or kV according to patient size and/or use of iterative reconstruction technique. COMPARISON:  None Available. FINDINGS: CT HEAD FINDINGS Brain: No evidence of acute infarction, hemorrhage, hydrocephalus, extra-axial collection or mass lesion/mass effect. Sequela of mild chronic microvascular ischemic change. Vascular: No hyperdense vessel or unexpected calcification. Skull: Normal. Negative for fracture or focal  lesion. Sinuses/Orbits: 2 no middle ear or mastoid effusion. Paranasal sinuses are notable for mucosal thickening in the left sphenoid sinus. Orbits are unremarkable. Other: None. CT CERVICAL SPINE FINDINGS Alignment: Trace anterolisthesis of C7 on T1. Skull base and vertebrae: No acute fracture. No primary bone lesion or focal pathologic process.Status post C5-C7 ACDF. Soft tissues and spinal canal: No prevertebral fluid or swelling. No visible canal hematoma. Disc levels:  No evidence of high-grade spinal canal stenosis. Upper chest: Right apical pleural-parenchymal scarring. Other: None IMPRESSION: 1. No acute intracranial abnormality. 2. No acute fracture or traumatic malalignment of the cervical spine. Electronically Signed   By: Lorenza Cambridge M.D.   On: 09/28/2023 14:17   DG Clavicle Right  Result Date: 09/28/2023 CLINICAL DATA:  Fall.  Right clavicular pain and tenderness. EXAM: RIGHT CLAVICLE - 2+ VIEWS COMPARISON:  None Available. FINDINGS: Mildly displaced fracture of the distal right clavicle is seen. AC joint remains intact. IMPRESSION: Mildly displaced distal right clavicle fracture. Electronically Signed  By: Danae Orleans M.D.   On: 09/28/2023 10:42   DG Shoulder Right  Result Date: 09/28/2023 CLINICAL DATA:  Fall.  Left shoulder injury and pain. EXAM: RIGHT SHOULDER - 2+ VIEW COMPARISON:  None Available. FINDINGS: Mildly displaced fracture of the distal right clavicle is seen. No other fractures identified. No evidence of shoulder dislocation. IMPRESSION: Mildly displaced distal right clavicle fracture. Electronically Signed   By: Danae Orleans M.D.   On: 09/28/2023 10:38   DG Ribs Unilateral W/Chest Right  Result Date: 09/28/2023 CLINICAL DATA:  Fall.  Blunt chest trauma and right chest wall pain. EXAM: RIGHT RIBS AND CHEST - 3+ VIEW COMPARISON:  Chest radiographs on 02/14/2023 FINDINGS: No acute fracture or other bone lesions are seen involving the ribs. There is no evidence of  pneumothorax or pleural effusion. Pulmonary hyperinflation is seen, consistent with COPD. Both lungs are clear. Heart size is normal. Moderate size hiatal hernia again seen. IMPRESSION: No evidence of acute right rib fracture or pneumothorax. COPD. No active lung disease. Moderate hiatal hernia. Electronically Signed   By: Danae Orleans M.D.   On: 09/28/2023 10:37    Procedures Procedures    Medications Ordered in ED Medications  lidocaine (LIDODERM) 5 % 1 patch (1 patch Transdermal Patch Applied 09/28/23 1329)  oxyCODONE-acetaminophen (PERCOCET/ROXICET) 5-325 MG per tablet 1 tablet (1 tablet Oral Given 09/28/23 1329)    ED Course/ Medical Decision Making/ A&P Clinical Course as of 09/28/23 1642  Wed Sep 28, 2023  1426 Negative CT head and Cervical spine.  [TY]    Clinical Course User Index [TY] Coral Spikes, DO                                 Medical Decision Making 72 year old female present emergency department after fall.  Afebrile vital signs reassuring.  CT head, C-spine and CT chest reassuring.  Only apparent injury is clavicle fracture.  Given no abdominal tenderness on exam, stable vitals and fall on Monday low suspicion for acute intra-abdominal pathology; therefore labs deferred.  Patient given Percocet for breakthrough pain.  Placed in sling.  She has an Ortho doctor that she follows for her chronic hip pain.  She will contact them for follow-up.  Stable for discharge at this time.  Amount and/or Complexity of Data Reviewed Radiology: ordered.  Risk OTC drugs. Prescription drug management.          Final Clinical Impression(s) / ED Diagnoses Final diagnoses:  Fall, initial encounter  Closed displaced fracture of shaft of right clavicle, initial encounter    Rx / DC Orders ED Discharge Orders          Ordered    oxyCODONE-acetaminophen (PERCOCET/ROXICET) 5-325 MG tablet  Every 6 hours PRN        09/28/23 1500    lidocaine 4 %  Every 24 hours         09/28/23 1500              Coral Spikes, DO 09/28/23 1642

## 2023-09-28 NOTE — Discharge Instructions (Signed)
Remains in sling for comfort until you are seen by the bone doctor.  No over the head movements with the right upper extremity.  Please take Tylenol and lidocaine patches for pain.  You may use Percocet for breakthrough pain, but please peak we reviewed the skin caused you to fall and be disoriented.  Please try to limit them and only use them for severe pain.

## 2023-09-28 NOTE — ED Triage Notes (Signed)
C/o mechanical fall Monday.  Pt reports LOC and hitting head.  Bruising noted to right side of face and right shoulder Denies blood thinner usage.

## 2023-09-28 NOTE — Progress Notes (Signed)
Established Patient Office Visit  Subjective   Patient ID: Jody Wilson, female    DOB: 1951-04-10  Age: 72 y.o. MRN: [id number]  Chief Complaint  Patient presents with   Fall    Patient feel Monday and hit all right side of body bruised all over     HPI The patient is a 72 year old female who presents for an acute visit following a fall at home on Monday. She experienced a loss of consciousness (LOC) for approximately 1 to 2 minutes during the incident. Upon evaluation, she was found to have sustained a fracture of the right shoulder and ribs, as well as a laceration and hematoma on the right eyebrow, along with a hematoma on the right side of the chest. X-rays performed in the office confirmed the fractures. Due to the mechanism of injury and the presence of hematomas, the patient was instructed to go to the emergency department for a CT scan to rule out any intracranial bleeding and for possible reduction of the fractures. She denies any other symptoms at this time, including nausea, vomiting, or dizziness since the fall. Patient Active Problem List   Diagnosis Date Noted   Fall 09/28/2023   Contusion of rib on right side 09/28/2023   Concussion with brief LOC 09/28/2023   Closed right hip fracture (HCC) 02/09/2023   Pulmonary nodule 02/09/2023   Electrolyte abnormality 02/09/2023   Hypertriglyceridemia 01/22/2022   Abnormal findings on diagnostic imaging of liver and biliary tract 12/09/2021   Diarrhea 12/09/2021   History of colonic polyps 12/09/2021   Weight loss 12/09/2021   Anxiety and depression 06/23/2020   Hiatal hernia 06/23/2020   Thoracic aortic atherosclerosis (HCC) 06/23/2020   Tobacco abuse 04/25/2020   Involuntary muscle jerks while sleeping 09/26/2019   B12 deficiency 09/25/2019   Iron deficiency anemia 09/25/2019   Elevated liver function tests 09/07/2019   Osteoporosis 02/09/2017   History of vertebral fracture 02/09/2017   GERD (gastroesophageal reflux  disease) 03/11/2016   Hyperlipidemia with target LDL less than 100 02/19/2015   Hypertension 03/18/2014   Past Medical History:  Diagnosis Date   Anemia    Anxiety    Aortic atherosclerosis (HCC)    Carotid artery disease (HCC)    Depression    GERD (gastroesophageal reflux disease)    Hyperlipidemia    Hypertension    Orthostatic hypotension    Osteoporosis    Past Surgical History:  Procedure Laterality Date   ABDOMINAL HYSTERECTOMY     BACK SURGERY     CHOLECYSTECTOMY     FOOT SURGERY     7 in the past   HIP ARTHROPLASTY Right 02/11/2023   Procedure: ARTHROPLASTY BIPOLAR HIP (HEMIARTHROPLASTY);  Surgeon: Oliver Barre, MD;  Location: [facility];  Service: Orthopedics;  Laterality: Right;   IR GENERIC HISTORICAL  12/09/2016   IR RADIOLOGIST EVAL & MGMT 12/09/2016 [facility]   IR GENERIC HISTORICAL  12/10/2016   IR VERTEBROPLASTY CERV/THOR BX INC UNI/BIL INC/INJECT/IMAGING 12/10/2016 [facility]   LEFT HEART CATH AND CORONARY ANGIOGRAPHY N/A 09/30/2020   Procedure: LEFT HEART CATH AND CORONARY ANGIOGRAPHY;  Surgeon: Elder Negus, MD;  Location: [facility];  Service: Cardiovascular;  Laterality: N/A;   TRACHEOSTOMY TUBE PLACEMENT  1982   Social History   Tobacco Use   Smoking status: Former    Current packs/day: 0.00    Average packs/day: 0.3 packs/day for 25.0 years (6.3 ttl pk-yrs)    Types: Cigarettes    Start date:  02/1998    Quit date: 02/2023    Years since quitting: 0.6    Passive exposure: Never   Smokeless tobacco: Never   Tobacco comments:    Trying to cut on number of cigarettes she smokes.  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  Tobacco Use   Smoking status: Former    Current packs/day: 0.00    Average packs/day: 0.3 packs/day for 25.0 years (6.3 ttl pk-yrs)    Types: Cigarettes    Start date: 02/1998    Quit date: 02/2023    Years since quitting: 0.6    Passive exposure: Never   Smokeless tobacco: Never   Tobacco comments:    Trying to cut on number of cigarettes she smokes.  Vaping Use   Vaping status: Never Used  Substance and Sexual Activity   Alcohol use: Not Currently    Comment: occassionally    Drug use: No   Sexual activity: Not on file  Other Topics Concern   Not on file  Social History Narrative   Lives with her husband, Jody Wilson, and has two grown sons and two grand sons.   She visits family and has a once weekly outing with a friend for socializing.  She enjoys making beaded jewelry, reading and walking.   Social Determinants of Health   Financial Resource Strain: Low Risk  (12/24/2022)   Overall Financial Resource Strain (CARDIA)    Difficulty of Paying Living Expenses: Not hard at all  Food Insecurity: No Food Insecurity (02/10/2023)   Hunger Vital Sign    Worried About Running Out of Food in the Last Year: Never true    Ran Out of Food in the Last Year: Never true  Transportation Needs: No Transportation Needs (02/10/2023)   PRAPARE - Administrator, Civil Service (Medical): No    Lack of Transportation (Non-Medical): No  Physical Activity: Inactive (12/24/2022)   Exercise Vital Sign    Days of Exercise per Week: 0 days    Minutes of Exercise per Session: 0 min  Stress: No Stress Concern Present (12/24/2022)   [employer] of Occupational Health - Occupational Stress Questionnaire    Feeling of Stress : Not at all  Social Connections: Socially Integrated (12/24/2022)   Social Connection and Isolation Panel [NHANES]     Frequency of Communication with Friends and Family: More than three times a week    Frequency of Social Gatherings with Friends and Family: More than three times a week    Attends Religious Services: More than 4 times per year    Active Member of [employer] or Organizations: Yes    Attends Banker Meetings: More than 4 times per year    Marital Status: Married  Catering manager Violence: Not At Risk (02/10/2023)   Humiliation, Afraid, Rape, and Kick questionnaire    Fear of Current or Ex-Partner: No    Emotionally Abused: No    Physically Abused: No    Sexually Abused:  No   Family Status  Relation Name Status   Mother  Deceased at age 84   Father  Deceased       Does not know biological father, thinks he is still living    Daughter  Deceased       Died at 24 hours old d/t kidney infection   Pat Uncle  Deceased   MGM  Deceased   MGF  Deceased   PGM  Deceased   PGF  Deceased   Son  Alive   Son  Alive   H Sister Debra Alive   H Sister  Alive   H Sister  Alive   H Sister  Alive   H Sister  Alive   Niece  Alive  No partnership data on file   Family History  Problem Relation Age of Onset   Heart disease Mother    Hypertension Mother    Hyperlipidemia Mother    Diabetes Mother    Congestive Heart Failure Mother    Aneurysm Paternal Uncle    Heart attack Maternal Grandmother    Heart attack Maternal Grandfather    Hypertension Son    Other Son 99       Kienbock's Disease   Liver disease Son 71       Gilbert's Disease   Lupus Half-Sister    Atrial fibrillation Half-Sister    Breast cancer Niece    Allergies  Allergen Reactions   Tetracyclines & Related Itching and Swelling      Review of Systems  Constitutional:  Negative for chills, fever and weight loss.  HENT:  Negative for sore throat and tinnitus.   Eyes:  Negative for double vision and pain.  Respiratory:  Negative for cough and shortness of breath.   Cardiovascular:  Negative for chest pain and  leg swelling.  Genitourinary:  Negative for flank pain, frequency and hematuria.  Musculoskeletal:  Positive for back pain and falls.  Neurological:  Negative for dizziness and headaches.  Endo/Heme/Allergies:  Negative for environmental allergies and polydipsia. Does not bruise/bleed easily.   Negative unless indicated in HPI   Objective:     BP (!) 149/81   Pulse 61   Temp 97.9 F (36.6 C) (Temporal)   Ht 5\' 5"  (1.651 m)   Wt 139 lb (63 kg)   SpO2 95%   BMI 23.13 kg/m  BP Readings from Last 3 Encounters:  09/28/23 124/71  09/28/23 (!) 149/81  08/12/23 137/80   Wt Readings from Last 3 Encounters:  09/28/23 132 lb (59.9 kg)  09/28/23 139 lb (63 kg)  08/12/23 142 lb 12.8 oz (64.8 kg)      Physical Exam Vitals and nursing note reviewed.  Constitutional:      Appearance: Normal appearance.  HENT:     Head: Normocephalic. Laceration present.      Comments: Hematoma on right side of HA, Denies being on blood thinner    Right Ear: Hearing, tympanic membrane, ear canal and external ear normal.     Left Ear: Hearing, tympanic membrane and external ear normal.     Nose: Nose normal.  Eyes:     General: Scleral icterus present.     Conjunctiva/sclera: Conjunctivae normal.     Pupils: Pupils are equal, round, and reactive to light.  Cardiovascular:     Rate and Rhythm: Normal rate and regular rhythm.  Pulmonary:     Effort: Pulmonary effort is normal.     Breath sounds: Normal breath sounds.  Musculoskeletal:     Right shoulder: Swelling, deformity, tenderness, bony tenderness and crepitus present. Decreased range of motion.     Left shoulder: Normal.  Skin:    General: Skin is warm and dry.  Neurological:     Mental Status: She is alert and oriented to person, place, and time. Mental status is at baseline.  Psychiatric:        Mood and Affect: Mood normal.        Behavior: Behavior normal.        Thought Content: Thought content normal.        Judgment: Judgment  normal.     No results found for any visits on 09/28/23.  Last CBC Lab Results  Component Value Date   WBC 6.0 06/30/2023   HGB 13.5 06/30/2023   HCT 39.8 06/30/2023   MCV 100 (H) 06/30/2023   MCH 34.0 (H) 06/30/2023   RDW 12.4 06/30/2023   PLT 208 06/30/2023   Last metabolic panel Lab Results  Component Value Date   GLUCOSE 95 06/30/2023   NA 141 06/30/2023   K 4.1 06/30/2023   CL 103 06/30/2023   CO2 23 06/30/2023   BUN 12 06/30/2023   CREATININE 0.70 06/30/2023   EGFR 92 06/30/2023   CALCIUM 9.5 06/30/2023   PHOS 2.9 02/12/2023   PROT 6.3 06/30/2023   ALBUMIN 3.9 06/30/2023   LABGLOB 2.4 06/30/2023   AGRATIO 1.7 03/31/2023   BILITOT 1.2 06/30/2023   ALKPHOS 92 06/30/2023   AST 20 06/30/2023   ALT 10 06/30/2023   ANIONGAP 6 02/13/2023   Last lipids Lab Results  Component Value Date   CHOL 176 12/30/2022   HDL 94 12/30/2022   LDLCALC 66 12/30/2022   LDLDIRECT 83 07/07/2021   TRIG 90 12/30/2022   CHOLHDL 1.9 12/30/2022   Last hemoglobin A1c No results found for: "HGBA1C" Last thyroid functions Lab Results  Component Value Date   TSH 1.060 04/14/2021   T4TOTAL 7.9 04/14/2021        Assessment & Plan:  Fall, initial encounter -     DG Ribs Unilateral W/Chest Right; Future -     DG Shoulder Right; Future  Contusion of rib on right side, initial encounter  Concussion with brief LOC  Colin is 72 yrs old Caucasian female X-ray: Mildly displaced fracture of the distal right clavicle is seen. AC joint remains intact. She ws instructed to go to ED, write spoke to husband, he agrees to take her to [facility] to r/u bleed and further care for the clavicle fx.   The above assessment and management plan was discussed with the patient. The patient verbalized understanding of and has agreed to the management plan. Patient is aware to call the clinic if they develop any new symptoms or if symptoms persist or worsen. Patient is aware when to return to the  clinic for a follow-up visit. Patient educated on when it is appropriate to go to the emergency department.   Return for as already with PCP.   Arrie Aran Santa Lighter, DNP [facility] [address] [place], [place] [address] [phone]

## 2023-09-30 ENCOUNTER — Ambulatory Visit: Payer: Medicare Other | Admitting: Family Medicine

## 2023-09-30 ENCOUNTER — Encounter: Payer: Self-pay | Admitting: Family Medicine

## 2023-09-30 VITALS — BP 139/79 | HR 54 | Ht 65.0 in | Wt 140.0 lb

## 2023-09-30 DIAGNOSIS — F419 Anxiety disorder, unspecified: Secondary | ICD-10-CM

## 2023-09-30 DIAGNOSIS — I10 Essential (primary) hypertension: Secondary | ICD-10-CM

## 2023-09-30 DIAGNOSIS — S42001D Fracture of unspecified part of right clavicle, subsequent encounter for fracture with routine healing: Secondary | ICD-10-CM | POA: Diagnosis not present

## 2023-09-30 DIAGNOSIS — S20211A Contusion of right front wall of thorax, initial encounter: Secondary | ICD-10-CM

## 2023-09-30 DIAGNOSIS — F32A Depression, unspecified: Secondary | ICD-10-CM

## 2023-09-30 MED ORDER — ALPRAZOLAM 0.5 MG PO TABS
0.5000 mg | ORAL_TABLET | Freq: Two times a day (BID) | ORAL | 2 refills | Status: DC | PRN
Start: 2023-09-30 — End: 2024-01-04

## 2023-09-30 NOTE — Progress Notes (Signed)
BP 139/79   Pulse (!) 54   Ht 5\' 5"  (1.651 m)   Wt 140 lb (63.5 kg)   SpO2 94%   BMI 23.30 kg/m    Subjective:   Patient ID: Jody Wilson, female    DOB: [birth year], 72 y.o.   MRN: [id number]  HPI: Jody Wilson is a 72 y.o. female presenting on 09/30/2023 for Medical Management of Chronic Issues, Anxiety, Depression, and Hypertension   HPI Anxiety depression recheck Patient is currently taking citalopram and alprazolam as needed for anxiety and depression.  Current rx-alprazolam 0.5 mg twice daily as needed # meds rx-60/month Effectiveness of current meds-works well, she is going through a lot right now but she seems to be doing okay with it Adverse reactions form meds-none except maybe some balance, she is going to go see neurology to be evaluated for this but not likely from this low dose of the medicine Pill count performed-No Last drug screen -07/18/2023 ( high risk q20m, moderate risk q47m, low risk yearly ) Urine drug screen today- No Was the N[facility]SR reviewed-yes  If yes were their any concerning findings? -None Controlled substance contract signed on: 07/18/2023    09/30/2023   10:55 AM 09/28/2023    9:32 AM 06/30/2023   10:33 AM 03/31/2023    9:57 AM 03/01/2023    2:57 PM  Depression screen PHQ 2/9  Decreased Interest 1 1 1 1  0  Down, Depressed, Hopeless 1 1 1  0 0  PHQ - 2 Score 2 2 2 1  0  Altered sleeping 1 1 1 1  0  Tired, decreased energy 1 1 3 1  0  Change in appetite 1 1 2 1  0  Feeling bad or failure about yourself  0 0 0 0 0  Trouble concentrating 0 0 0 0 0  Moving slowly or fidgety/restless 0 0 0 0 0  Suicidal thoughts 0 0 0 0 0  PHQ-9 Score 5 5 8 4  0  Difficult doing work/chores Somewhat difficult Somewhat difficult Somewhat difficult Somewhat difficult Not difficult at all    Hypertension Patient is currently on olmesartan 40 mg daily, and their blood pressure today is 139/79. Patient denies any lightheadedness or dizziness. Patient denies headaches,  blurred vision, chest pains, shortness of breath, or weakness. Denies any side effects from medication and is content with current medication.   Fall and concussion and clavicle fracture Patient had a fall last week where she fell on the right side of her body and hit the right side of her face and had a concussion and a clavicular fracture of the right side.  She still has significant bruising and soreness on the right side and is currently in a sling and is going to call orthopedic to get an appointment for follow-up.  We discussed that should probably need rehab and that we got a figure out why she is falling and work on preventing it so this does not happen again.  She will see her neurologist for balance issues  Relevant past medical, surgical, family and social history reviewed and updated as indicated. Interim medical history since our last visit reviewed. Allergies and medications reviewed and updated.  Review of Systems  Constitutional:  Negative for chills and fever.  Eyes:  Negative for visual disturbance.  Respiratory:  Negative for chest tightness and shortness of breath.   Cardiovascular:  Negative for chest pain and leg swelling.  Genitourinary:  Negative for difficulty urinating and dysuria.  Musculoskeletal:  Positive for  arthralgias and myalgias. Negative for back pain and gait problem.  Skin:  Positive for color change. Negative for rash and wound.  Neurological:  Negative for dizziness, light-headedness and headaches.  Psychiatric/Behavioral:  Negative for agitation and behavioral problems.   All other systems reviewed and are negative.   Per HPI unless specifically indicated above   Allergies as of 09/30/2023       Reactions   Tetracyclines & Related Itching, Swelling        Medication List        Accurate as of September 30, 2023 11:14 AM. If you have any questions, ask your nurse or doctor.          ALPRAZolam 0.5 MG tablet Commonly known as:  XANAX Take 1 tablet (0.5 mg total) by mouth 2 (two) times daily as needed for anxiety.   aspirin EC 81 MG tablet Take 1 tablet (81 mg total) by mouth 2 (two) times daily. Swallow whole.   calcium-vitamin D 500-200 MG-UNIT tablet Commonly known as: OSCAL WITH D Take 1 tablet by mouth daily with lunch.   citalopram 40 MG tablet Commonly known as: CELEXA Take 1 tablet (40 mg total) by mouth daily.   clobetasol cream 0.05 % Commonly known as: TEMOVATE Apply 1 Application topically 2 (two) times daily.   cyanocobalamin 1000 MCG/ML injection Commonly known as: VITAMIN B12 INJECT 1 ML INTO MUSCLE EVERY 28 DAYS   ergocalciferol 1.25 MG (50000 UT) capsule Commonly known as: VITAMIN D2 Take 1 capsule (50,000 Units total) by mouth every 14 (fourteen) days.   ezetimibe 10 MG tablet Commonly known as: ZETIA Take 1 tablet (10 mg total) by mouth daily.   folic acid 1 MG tablet Commonly known as: FOLVITE Take 1 mg by mouth daily.   hydrochlorothiazide 12.5 MG capsule Commonly known as: MICROZIDE Take 1 capsule (12.5 mg total) by mouth daily.   levETIRAcetam 250 MG tablet Commonly known as: KEPPRA Take 250 mg by mouth at bedtime.   lidocaine 4 % Place 1 patch onto the skin daily.   metroNIDAZOLE 0.75 % vaginal gel Commonly known as: METROGEL Place 1 Applicatorful vaginally 2 (two) times daily.   olmesartan 40 MG tablet Commonly known as: BENICAR Take 1 tablet (40 mg total) by mouth daily. What changed: additional instructions   oxyCODONE-acetaminophen 5-325 MG tablet Commonly known as: PERCOCET/ROXICET Take 1 tablet by mouth every 6 (six) hours as needed for severe pain.   pantoprazole 40 MG tablet Commonly known as: PROTONIX Take 1 tablet (40 mg total) by mouth daily.   pravastatin 40 MG tablet Commonly known as: PRAVACHOL Take 1 tablet (40 mg total) by mouth every evening.         Objective:   BP 139/79   Pulse (!) 54   Ht 5\' 5"  (1.651 m)   Wt 140 lb (63.5  kg)   SpO2 94%   BMI 23.30 kg/m   Wt Readings from Last 3 Encounters:  09/30/23 140 lb (63.5 kg)  09/28/23 132 lb (59.9 kg)  09/28/23 139 lb (63 kg)    Physical Exam Vitals and nursing note reviewed.  Constitutional:      General: She is not in acute distress.    Appearance: She is well-developed. She is not diaphoretic.  Eyes:     Conjunctiva/sclera: Conjunctivae normal.  Cardiovascular:     Rate and Rhythm: Normal rate and regular rhythm.     Heart sounds: Normal heart sounds. No murmur heard. Pulmonary:  Effort: Pulmonary effort is normal. No respiratory distress.     Breath sounds: Normal breath sounds. No wheezing.  Musculoskeletal:        General: Tenderness (Pain around her right shoulder, significant bruising as well, and the sling) present. Normal range of motion.  Skin:    General: Skin is warm and dry.     Findings: Bruising (Bruising around right shoulder and upper chest and just to the right side of her eye) present. No rash.  Neurological:     Mental Status: She is alert and oriented to person, place, and time.     Coordination: Coordination normal.  Psychiatric:        Behavior: Behavior normal.       Assessment & Plan:   Problem List Items Addressed This Visit       Cardiovascular and Mediastinum   Hypertension - Primary     Other   Anxiety and depression   Relevant Medications   ALPRAZolam (XANAX) 0.5 MG tablet   Other Visit Diagnoses     Contusion of rib on right side, initial encounter       Closed displaced fracture of right clavicle with routine healing, unspecified part of clavicle, subsequent encounter           Continue current medicine for now she is going to see neurology and be evaluated for balance, do not think it is likely this low dose of alprazolam causing it because it is a very low dose. Follow up plan: Return in about 3 months (around 12/31/2023), or if symptoms worsen or fail to improve, for Hypertension and anxiety and  depression.  Counseling provided for all of the vaccine components No orders of the defined types were placed in this encounter.   Arville Care, MD [facility] 09/30/2023, 11:14 AM

## 2023-10-11 ENCOUNTER — Encounter: Payer: Self-pay | Admitting: Orthopedic Surgery

## 2023-10-11 ENCOUNTER — Ambulatory Visit: Payer: Medicare Other | Admitting: Orthopedic Surgery

## 2023-10-11 ENCOUNTER — Other Ambulatory Visit (INDEPENDENT_AMBULATORY_CARE_PROVIDER_SITE_OTHER): Payer: Medicare Other

## 2023-10-11 VITALS — BP 156/78 | HR 58 | Ht 65.0 in | Wt 139.0 lb

## 2023-10-11 DIAGNOSIS — R1031 Right lower quadrant pain: Secondary | ICD-10-CM

## 2023-10-11 DIAGNOSIS — S42021A Displaced fracture of shaft of right clavicle, initial encounter for closed fracture: Secondary | ICD-10-CM | POA: Diagnosis not present

## 2023-10-11 MED ORDER — OXYCODONE-ACETAMINOPHEN 5-325 MG PO TABS
1.0000 | ORAL_TABLET | ORAL | 0 refills | Status: DC | PRN
Start: 2023-10-11 — End: 2023-10-25

## 2023-10-11 NOTE — Progress Notes (Signed)
Orthopaedic Clinic Return  Assessment: Jody Wilson is a 72 y.o. female with the following: 1.  Right clavicle shaft fracture 2.  Right groin pain, following fall; in setting of prior right hip hemiarthroplasty  Plan: [name] sustained a fall, a couple of weeks ago.  She sustained a right midshaft clavicle fracture.  This is minimally displaced.  It is amenable to nonoperative management.  She will continue to use a sling.  I provided her with a an updated prescription for Percocet.  She should take this very carefully.  She should consider cutting her pills in half.  In addition, she has had worsening right groin pain.  We have repeated right radiographs of the right hip hemiarthroplasty, and this remains in good position.  No signs of new injury.  No dislocation.  No subsidence of the implants.  Anticipate that this is an exacerbation of some occasional groin pain following surgery.  Nothing to do at this time.  We will continue to monitor closely.  Meds ordered this encounter  Medications   oxyCODONE-acetaminophen (PERCOCET/ROXICET) 5-325 MG tablet    Sig: Take 1 tablet by mouth every 4 (four) hours as needed for severe pain (pain score 7-10).    Dispense:  20 tablet    Refill:  0    Body mass index is 23.13 kg/m.  Follow-up: Return in about 2 weeks (around 10/25/2023).   Subjective:  Chief Complaint  Patient presents with   Fracture    R clavicle DOI 09/26/23    History of Present Illness: Jody Wilson is a 72 y.o. female who returns to clinic for evaluation of right shoulder pain.  Approximate 2 weeks ago, she fell.  She states that she gets lightheaded.  She is not sure what has caused her to fall this time.  She was unconscious.  She needed assistance.  She was evaluated in the emergency department, after seeing her regular doctor.  Radiographs demonstrated a clavicle shaft fracture.  She has been in a sling.  In addition, she has been having groin pain since the  fall.  She had right hip hemiarthroplasty completed earlier this year, and has had some intermittent pain in the groin, but her current pain is worse.  She has been taking some pain medication since the fall.  Review of Systems: No fevers or chills No numbness or tingling No chest pain No shortness of breath No bowel or bladder dysfunction No GI distress No headaches   Objective: BP (!) 156/78   Pulse (!) 58   Ht 5\' 5"  (1.651 m)   Wt 139 lb (63 kg)   BMI 23.13 kg/m   Physical Exam:  Alert and oriented.  No acute distress.  She is able to ambulate without assistive device.  She has ecchymosis over the shoulder and onto her chest.  She has pain primarily in the posterior aspect of her shoulder.  Sensation intact in the right hand.  Sensation intact in the axillary nerve distribution.  2+ radial pulse.  She tolerates gentle range of motion of the right hip.  IMAGING: I personally ordered and reviewed the following images:  AP pelvis and right hip x-rays were obtained in clinic today.  No acute injuries are noted.  Well-positioned hemiarthroplasty within the hip joint.  No evidence of subsidence.  There is no dislocation.  Compared to prior x-rays, there has been no change in overall alignment or position of the right hip hemiarthroplasty.  Impression: Stable right hip hemiarthroplasty with  out acute injury   Oliver Barre, MD 10/11/2023 4:32 PM

## 2023-10-11 NOTE — Patient Instructions (Signed)
Sling at all times  OK to remove for hygiene  Pain medication provided.  Please take this carefully.  Consider trying a smaller dose.  You can cut the pill in half

## 2023-10-25 ENCOUNTER — Other Ambulatory Visit (INDEPENDENT_AMBULATORY_CARE_PROVIDER_SITE_OTHER): Payer: Medicare Other

## 2023-10-25 ENCOUNTER — Ambulatory Visit (INDEPENDENT_AMBULATORY_CARE_PROVIDER_SITE_OTHER): Payer: Medicare Other | Admitting: Orthopedic Surgery

## 2023-10-25 DIAGNOSIS — S42021D Displaced fracture of shaft of right clavicle, subsequent encounter for fracture with routine healing: Secondary | ICD-10-CM

## 2023-10-25 DIAGNOSIS — S42021A Displaced fracture of shaft of right clavicle, initial encounter for closed fracture: Secondary | ICD-10-CM | POA: Diagnosis not present

## 2023-10-25 MED ORDER — OXYCODONE-ACETAMINOPHEN 5-325 MG PO TABS: 1.0000 | ORAL_TABLET | Freq: Four times a day (QID) | ORAL | 0 refills | Status: DC | PRN

## 2023-10-25 MED ORDER — CYCLOBENZAPRINE HCL 10 MG PO TABS: 10.0000 mg | ORAL_TABLET | Freq: Two times a day (BID) | ORAL | 0 refills | Status: DC | PRN

## 2023-10-25 NOTE — Patient Instructions (Signed)
Pendulum   Stand near a wall or a surface that you can hold onto for balance. Bend at the waist and let your left / right arm hang straight down. Use your other arm to support you. Keep your back straight and do not lock your knees. Relax your left / right arm and shoulder muscles, and move your hips and your trunk so your left / right arm swings freely. Your arm should swing because of the motion of your body, not because you are using your arm or shoulder muscles. Keep moving your hips and trunk so your arm swings in the following directions, as told by your health care provider: Side to side. Forward and backward. In clockwise and counterclockwise circles. Continue each motion for 20 seconds, or for as long as told by your health care provider. Slowly return to the starting position.  Repeat 10 times. Complete this exercise daily.  

## 2023-10-25 NOTE — Progress Notes (Unsigned)
Orthopaedic Clinic Return  Assessment: Jody Wilson is a 72 y.o. female with the following: 1.  Right clavicle shaft fracture 2.  Right groin pain, following fall; in setting of prior right hip hemiarthroplasty  Plan: Jody Wilson sustained a right clavicle fracture approximately 1 month ago.  Radiographs remained stable.  Her pain is improving.  She is able to use her arm on a more consistent basis.  Advised her to continue to use the sling when she is upright.  Otherwise, can remove the sling.  She can also start working on some range of motion of the elbow, wrist and hand, and initiate pendulum activities.  She states her understanding.  Regarding the pain she is describing the right thigh, this is consistent with a muscle spasm, and is not necessarily coming from her prior hip surgery.  I provided her with an updated prescription for oxycodone for her shoulder pain, which she states she only takes at night.  I have also given her some Flexeril, which could help with the pains that she is experiencing in the right thigh.  Meds ordered this encounter  Medications   cyclobenzaprine (FLEXERIL) 10 MG tablet    Sig: Take 1 tablet (10 mg total) by mouth 2 (two) times daily as needed.    Dispense:  20 tablet    Refill:  0   oxyCODONE-acetaminophen (PERCOCET/ROXICET) 5-325 MG tablet    Sig: Take 1 tablet by mouth every 6 (six) hours as needed for severe pain (pain score 7-10).    Dispense:  20 tablet    Refill:  0     Follow-up: Return in about 2 weeks (around 11/08/2023).   Subjective:  Chief Complaint  Patient presents with   Fracture     R clavicle DOI 09/26/23    History of Present Illness: Jody Wilson is a 72 y.o. female who returns to clinic for evaluation of right shoulder pain.  She fell and sustained a right clavicle fracture, approximately 4 weeks ago.  She remains in a sling.  She is doing better.  Her pain is better.  Her function has improved.  The bruising is much  better.  She continues to take oxycodone, but primarily at nighttime.  She continues to have some pain in the right thigh.  She localizes the pain to the distal thigh, radiating proximally into the groin.  She describes some spasm type pain.   Review of Systems: No fevers or chills No numbness or tingling No chest pain No shortness of breath No bowel or bladder dysfunction No GI distress No headaches   Objective: There were no vitals taken for this visit.  Physical Exam:  Alert and oriented.  No acute distress.  She is able to ambulate without assistive device.  Minimal bruising.  Mild deformity over the clavicle.  Tenderness to palpation.  Sensation intact in the axillary nerve distribution.  Fingers are warm and well-perfused.  IMAGING: I personally ordered and reviewed the following images:  X-rays of the right clavicle were obtained in clinic today.  These are compared to prior x-rays.  There is an oblique fracture through the shaft of the clavicle, with superior displacement of the proximal fragment.  Compared to prior x-rays, there is been no interval displacement.  There is no obvious callus formation.  No additional injuries are noted.  Impression: Stable right clavicle shaft fracture   Oliver Barre, MD 10/26/2023 8:13 AM

## 2023-10-26 ENCOUNTER — Encounter: Payer: Self-pay | Admitting: Orthopedic Surgery

## 2023-10-31 ENCOUNTER — Other Ambulatory Visit: Payer: Self-pay | Admitting: Hematology

## 2023-10-31 DIAGNOSIS — R7989 Other specified abnormal findings of blood chemistry: Secondary | ICD-10-CM

## 2023-11-01 ENCOUNTER — Telehealth: Payer: Self-pay

## 2023-11-01 NOTE — Telephone Encounter (Signed)
Transition Care Management Unsuccessful Follow-up Telephone Call  Date of [facility] and from where:  [facility]  Attempts:  1st Attempt  Reason for unsuccessful TCM follow-up call:  No answer/busy   Lenard Forth   [facility], [facility] Guide, Phone: [phone] Website: [email or web address]

## 2023-11-08 ENCOUNTER — Encounter: Payer: Medicare Other | Admitting: Orthopedic Surgery

## 2023-11-09 ENCOUNTER — Other Ambulatory Visit (INDEPENDENT_AMBULATORY_CARE_PROVIDER_SITE_OTHER): Payer: Medicare Other

## 2023-11-09 ENCOUNTER — Encounter: Payer: Self-pay | Admitting: Orthopedic Surgery

## 2023-11-09 ENCOUNTER — Ambulatory Visit (INDEPENDENT_AMBULATORY_CARE_PROVIDER_SITE_OTHER): Payer: Medicare Other | Admitting: Orthopedic Surgery

## 2023-11-09 DIAGNOSIS — S42021D Displaced fracture of shaft of right clavicle, subsequent encounter for fracture with routine healing: Secondary | ICD-10-CM | POA: Diagnosis not present

## 2023-11-09 NOTE — Progress Notes (Signed)
Orthopaedic Clinic Return  Assessment: Jody Wilson is a 72 y.o. female with the following: 1.  Right clavicle shaft fracture 2.  Right groin pain, following fall; in setting of prior right hip hemiarthroplasty  Plan: Jody Wilson sustained a right clavicle fracture approximately 6 weeks ago.  Radiographs are stable.  Her pain is much better.  Okay to remove the sling.  Initiate passive range of motion.  Exercises were demonstrated in clinic today.  In approximately 2 weeks, she can start to advance her active motion.  Limit lifting to nothing more than a coffee cup.  I would like see her back in approximately 1 month.  Regarding the groin pain, this was discussed in clinic today.  Radiographs have remained stable.  There is no obvious problem with the implant.  If she continues to struggle, we may have to discuss a revision to a total hip arthroplasty.  She states understanding.  She would like to avoid another surgery if possible.   Follow-up: Return in about 4 weeks (around 12/07/2023).   Subjective:  Chief Complaint  Patient presents with   Routine Post Op    R clavicle DOI 09/26/23    History of Present Illness: Jody Wilson is a 72 y.o. female who returns to clinic for evaluation of right shoulder pain.  She fell and sustained a right clavicle fracture, approximately 6 weeks ago.  At the last visit, she initiated pendulum activities.  She states her pain is much better.  She is starting to come out of the sling a little bit.  No numbness or tingling.  She still has pain in the groin, which does radiate distally.  This has remained constant.  She continues take pain medication, but this is intermittent.  Review of Systems: No fevers or chills No numbness or tingling No chest pain No shortness of breath No bowel or bladder dysfunction No GI distress No headaches   Objective: There were no vitals taken for this visit.  Physical Exam:  Alert and oriented.  No acute  distress.  She is able to ambulate without assistive device.  Left shoulder demonstrates mild deformity over the clavicle.  No gross motion at the fracture site.  There is tenderness to palpation of the distal clavicle.  Minimal bruising.  Minimal swelling.  Sensation intact distally.  Fingers warm well-perfused.  IMAGING: I personally ordered and reviewed the following images:  X-rays of the right clavicle were obtained in clinic today.  These are compared available x-rays.  Oblique fracture through the midshaft of the fracture remains in stable alignment.  There is no obvious callus formation.  There does appear to be some healing of the bone, proximally.  No interval displacement.  No new injuries.  No bony lesions.  Impression: Stable right clavicle shaft fracture   Oliver Barre, MD 11/09/2023 3:04 PM

## 2023-12-07 ENCOUNTER — Other Ambulatory Visit (INDEPENDENT_AMBULATORY_CARE_PROVIDER_SITE_OTHER): Payer: Medicare Other

## 2023-12-07 ENCOUNTER — Ambulatory Visit (INDEPENDENT_AMBULATORY_CARE_PROVIDER_SITE_OTHER): Payer: Medicare Other | Admitting: Orthopedic Surgery

## 2023-12-07 DIAGNOSIS — S42021D Displaced fracture of shaft of right clavicle, subsequent encounter for fracture with routine healing: Secondary | ICD-10-CM

## 2023-12-08 ENCOUNTER — Encounter: Payer: Self-pay | Admitting: Orthopedic Surgery

## 2023-12-08 NOTE — Progress Notes (Signed)
Orthopaedic Clinic Return  Assessment: Jody Wilson is a 72 y.o. female with the following: 1.  Right clavicle shaft fracture 2.  Right groin pain, following fall; in setting of prior right hip hemiarthroplasty - improved  Plan: Jody Wilson sustained a right clavicle fracture approximately 2 months ago.  Fracture is displaced, with mild comminution.  Overall, radiographs have remained stable.  Her pain is much better.  There is no obvious callus or bone formation on x-ray.  This was reviewed with the patient in clinic today.  She does have a mild deformity.  There is no gross motion at the fracture site.  As she is doing much better overall, I would recommend against anything further at this time.  She is in agreement with this plan.  She will steadily increase her activity, focusing more on range of motion and then strengthening.  In addition, she also states that the groin pain she has been dealing with following her right hip surgery has gotten better over the past few weeks.  As such, I would like to see her back in approximately 2 months, which is close to the 1 year anniversary following her surgery on her hip.   Follow-up: Return in about 2 months (around 02/07/2024).   Subjective:  Chief Complaint  Patient presents with   Fracture    R clavicle DOI 09/26/23    History of Present Illness: Jody Wilson is a 72 y.o. female who returns to clinic for evaluation of right shoulder pain.  She fell and sustained a right clavicle fracture, approximately 2 months ago.  She is no longer using a sling.  She has been focusing on range of motion, below the level of her shoulder.  Her pain is much better.  No numbness or tingling.  Pain in her right groin is much better.  Review of Systems: No fevers or chills No numbness or tingling No chest pain No shortness of breath No bowel or bladder dysfunction No GI distress No headaches   Objective: There were no vitals taken for this  visit.  Physical Exam:  Alert and oriented.  No acute distress.  She is able to ambulate without assistive device.  Right shoulder demonstrates mild deformity over the clavicle.  No gross motion at the fracture site.  She continues to have some tenderness to palpation over the clavicle.  No bruising in this area.  She tolerates forward flexion to 100 degrees.  Abduction to 90 degrees.  Fingers warm and well-perfused.  IMAGING: I personally ordered and reviewed the following images:  X-rays of the right clavicle were obtained in clinic today.  These are compared to prior x-rays.  No change in overall alignment.  Persistent displacement of a midshaft clavicle fracture.  No obvious callus formation.  No further displacement.  No bony lesions.  Impression: Stable right clavicle fracture with persistent displacement and without obvious callus formation.  Oliver Barre, MD 12/08/2023 10:57 PM

## 2023-12-20 ENCOUNTER — Other Ambulatory Visit: Payer: Self-pay

## 2023-12-20 DIAGNOSIS — Z87891 Personal history of nicotine dependence: Secondary | ICD-10-CM

## 2023-12-20 DIAGNOSIS — Z122 Encounter for screening for malignant neoplasm of respiratory organs: Secondary | ICD-10-CM

## 2023-12-27 ENCOUNTER — Ambulatory Visit: Payer: Medicare Other

## 2023-12-27 VITALS — Ht 65.0 in | Wt 139.0 lb

## 2023-12-27 DIAGNOSIS — Z0000 Encounter for general adult medical examination without abnormal findings: Secondary | ICD-10-CM

## 2023-12-27 NOTE — Progress Notes (Signed)
 Subjective:   Jody Wilson is a 73 y.o. female who presents for Medicare Annual (Subsequent) preventive examination.  Visit Complete: Virtual I connected with  Jody Wilson on 12/27/23 by a audio enabled telemedicine application and verified that I am speaking with the correct person using two identifiers.  Patient Location: Home  Provider Location: Home Office  I discussed the limitations of evaluation and management by telemedicine. The patient expressed understanding and agreed to proceed.  Vital Signs: Because this visit was a virtual/telehealth visit, some criteria may be missing or patient reported. Any vitals not documented were not able to be obtained and vitals that have been documented are patient reported.  Cardiac Risk Factors include: advanced age (>33men, >6 women);dyslipidemia;hypertension;smoking/ tobacco exposure     Objective:    Today's Vitals   12/27/23 1132  Weight: 139 lb (63 kg)  Height: 5' 5 (1.651 m)   Body mass index is 23.13 kg/m.     12/27/2023    7:15 PM 09/28/2023   12:05 PM 07/07/2023    2:11 PM 05/19/2023    6:07 PM 02/11/2023    2:35 PM 02/10/2023    8:51 AM 02/09/2023    3:01 PM  Advanced Directives  Does Patient Have a Medical Advance Directive? No No No No  No No  Would patient like information on creating a medical advance directive? Yes (MAU/Ambulatory/Procedural Areas - Information given) No - Patient declined No - Patient declined  No - Patient declined      Current Medications (verified) Outpatient Encounter Medications as of 12/27/2023  Medication Sig   ALPRAZolam  (XANAX ) 0.5 MG tablet Take 1 tablet (0.5 mg total) by mouth 2 (two) times daily as needed for anxiety.   aspirin  EC 81 MG tablet Take 1 tablet (81 mg total) by mouth 2 (two) times daily. Swallow whole.   calcium -vitamin D  (OSCAL WITH D) 500-200 MG-UNIT tablet Take 1 tablet by mouth daily with lunch.    citalopram  (CELEXA ) 40 MG tablet Take 1 tablet (40 mg total) by  mouth daily.   clobetasol  cream (TEMOVATE ) 0.05 % Apply 1 Application topically 2 (two) times daily.   cyanocobalamin  (VITAMIN B12) 1000 MCG/ML injection INJECT 1 ML IM EVERY 28 DAYS   cyclobenzaprine  (FLEXERIL ) 10 MG tablet Take 1 tablet (10 mg total) by mouth 2 (two) times daily as needed.   ergocalciferol  (VITAMIN D2) 1.25 MG (50000 UT) capsule Take 1 capsule (50,000 Units total) by mouth every 14 (fourteen) days.   ezetimibe  (ZETIA ) 10 MG tablet Take 1 tablet (10 mg total) by mouth daily.   folic acid  (FOLVITE ) 1 MG tablet Take 1 mg by mouth daily.   hydrochlorothiazide  (MICROZIDE ) 12.5 MG capsule Take 1 capsule (12.5 mg total) by mouth daily.   levETIRAcetam  (KEPPRA ) 250 MG tablet Take 250 mg by mouth at bedtime.   lidocaine  4 % Place 1 patch onto the skin daily.   metroNIDAZOLE (METROGEL) 0.75 % vaginal gel Place 1 Applicatorful vaginally 2 (two) times daily.   olmesartan  (BENICAR ) 40 MG tablet Take 1 tablet (40 mg total) by mouth daily. (Patient taking differently: Take 40 mg by mouth daily. 20 mg am and 20 mg pm)   oxyCODONE -acetaminophen  (PERCOCET/ROXICET) 5-325 MG tablet Take 1 tablet by mouth every 6 (six) hours as needed for severe pain (pain score 7-10).   pantoprazole  (PROTONIX ) 40 MG tablet Take 1 tablet (40 mg total) by mouth daily.   pravastatin  (PRAVACHOL ) 40 MG tablet Take 1 tablet (40 mg total) by  mouth every evening.   No facility-administered encounter medications on file as of 12/27/2023.    Allergies (verified) Tetracyclines & related   History: Past Medical History:  Diagnosis Date   Anemia    Anxiety    Aortic atherosclerosis (HCC)    Carotid artery disease (HCC)    Depression    GERD (gastroesophageal reflux disease)    Hyperlipidemia    Hypertension    Orthostatic hypotension    Osteoporosis    Past Surgical History:  Procedure Laterality Date   ABDOMINAL HYSTERECTOMY     BACK SURGERY     CHOLECYSTECTOMY     FOOT SURGERY     7 in the past   HIP  ARTHROPLASTY Right 02/11/2023   Procedure: ARTHROPLASTY BIPOLAR HIP (HEMIARTHROPLASTY);  Surgeon: Onesimo Oneil LABOR, MD;  Location: [facility];  Service: Orthopedics;  Laterality: Right;   IR GENERIC HISTORICAL  12/09/2016   IR RADIOLOGIST EVAL & MGMT 12/09/2016 [facility]   IR GENERIC HISTORICAL  12/10/2016   IR VERTEBROPLASTY CERV/THOR BX INC UNI/BIL INC/INJECT/IMAGING 12/10/2016 [facility]   LEFT HEART CATH AND CORONARY ANGIOGRAPHY N/A 09/30/2020   Procedure: LEFT HEART CATH AND CORONARY ANGIOGRAPHY;  Surgeon: Elmira Newman PARAS, MD;  Location: [facility];  Service: Cardiovascular;  Laterality: N/A;   TRACHEOSTOMY TUBE PLACEMENT  1982   Family History  Problem Relation Age of Onset   Heart disease Mother    Hypertension Mother    Hyperlipidemia Mother    Diabetes Mother    Congestive Heart Failure Mother    Aneurysm Paternal Uncle    Heart attack Maternal Grandmother    Heart attack Maternal Grandfather    Hypertension Son    Other Son 54       Kienbock's Disease   Liver disease Son 67       Gilbert's Disease   Lupus Half-Sister    Atrial fibrillation Half-Sister    Breast cancer Niece    Social History   Socioeconomic History   Marital status: Married    Spouse name: Jody Wilson   Number of children: 2   Years of education: 12   Highest education level: Associate degree: occupational, scientist, product/process development, or vocational program  Occupational History   Occupation: retired    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  Tobacco Use   Smoking status: Former    Current packs/day: 0.00    Average packs/day: 0.3 packs/day for 25.0 years (6.3 ttl pk-yrs)    Types: Cigarettes    Start date: 02/1998    Quit date: 02/2023    Years since quitting: 0.8    Passive exposure: Never   Smokeless tobacco: Never   Tobacco comments:    Trying to cut on number of cigarettes she smokes.  Vaping Use   Vaping status: Never Used  Substance and Sexual  Activity   Alcohol use: Not Currently    Comment: occassionally    Drug use: No   Sexual activity: Not on file  Other Topics Concern   Not on file  Social History Narrative   Lives with her husband, Jenene, and has two grown sons and two grand sons.   She visits family and has a once weekly outing with a friend for socializing.  She enjoys making beaded jewelry, reading and walking.   Social Drivers of Health   Financial Resource Strain: Low Risk  (12/27/2023)   Overall Financial Resource Strain (CARDIA)    Difficulty of Paying  Living Expenses: Not hard at all  Food Insecurity: No Food Insecurity (12/27/2023)   Hunger Vital Sign    Worried About Running Out of Food in the Last Year: Never true    Ran Out of Food in the Last Year: Never true  Transportation Needs: No Transportation Needs (12/27/2023)   PRAPARE - Administrator, Civil Service (Medical): No    Lack of Transportation (Non-Medical): No  Physical Activity: Inactive (12/27/2023)   Exercise Vital Sign    Days of Exercise per Week: 0 days    Minutes of Exercise per Session: 0 min  Stress: No Stress Concern Present (12/27/2023)   [employer] of Occupational Health - Occupational Stress Questionnaire    Feeling of Stress : Not at all  Social Connections: Socially Integrated (12/27/2023)   Social Connection and Isolation Panel [NHANES]    Frequency of Communication with Friends and Family: More than three times a week    Frequency of Social Gatherings with Friends and Family: Twice a week    Attends Religious Services: More than 4 times per year    Active Member of [employer] or Organizations: Yes    Attends Engineer, Structural: More than 4 times per year    Marital Status: Married    Tobacco Counseling Counseling given: Not Answered Tobacco comments: Trying to cut on number of cigarettes she smokes.   Clinical Intake:  Pre-visit preparation completed: Yes  Pain : No/denies pain     Diabetes:  No  How often do you need to have someone help you when you read instructions, pamphlets, or other written materials from your doctor or pharmacy?: 1 - Never  Interpreter Needed?: No  Information entered by :: Charmaine Bloodgood LPN   Activities of Daily Living    12/27/2023    7:15 PM 02/10/2023    5:01 PM  In your present state of health, do you have any difficulty performing the following activities:  Hearing? 0   Vision? 0   Difficulty concentrating or making decisions? 0   Walking or climbing stairs? 0   Dressing or bathing? 0   Doing errands, shopping? 0 0  Preparing Food and eating ? N   Using the Toilet? N   In the past six months, have you accidently leaked urine? N   Do you have problems with loss of bowel control? N   Managing your Medications? N   Managing your Finances? N   Housekeeping or managing your Housekeeping? N     Patient Care Team: Dettinger, Fonda LABOR, MD as PCP - General (Family Medicine) Roddie Bring, DPM as Consulting Physician (Podiatry) Burnette Fallow, MD as Consulting Physician (Gastroenterology) Michele Richardson, DO as Consulting Physician (Cardiology) Billee Mliss BIRCH, Physician [facility] as Pharmacist (Family Medicine) Pennington, Rebekah M, PA-C as Physician Assistant (Oncology) Vicci Mcardle, OD (Optometry) Early, Krystal FALCON, MD (Inactive) as Consulting Physician (Vascular Surgery)  Indicate any recent Medical Services you may have received from other than [facility] providers in the past year (date may be approximate).     Assessment:   This is a routine wellness examination for Katheryn.  Hearing/Vision screen Hearing Screening - Comments:: Denies hearing difficulties   Vision Screening - Comments:: Wears rx glasses - up to date with routine eye exams with Dr. Ladora     Goals Addressed   None   Depression Screen    12/27/2023    7:13 PM 09/30/2023   10:55 AM 09/28/2023    9:32 AM 06/30/2023  10:33 AM 03/31/2023    9:57 AM 03/01/2023    2:57 PM 12/30/2022    2:03 PM   PHQ 2/9 Scores  PHQ - 2 Score 2 2 2 2 1  0 0  PHQ- 9 Score 6 5 5 8 4  0 6    Fall Risk    12/27/2023    7:14 PM 09/30/2023   10:55 AM 09/28/2023    9:31 AM 06/30/2023   10:33 AM 03/31/2023    9:57 AM  Fall Risk   Falls in the past year? 1 1 1 1 1   Number falls in past yr: 1 1 1 1 1   Injury with Fall? 1 1 1 1 1   Risk for fall due to : History of fall(s);Impaired balance/gait;Impaired mobility History of fall(s);Orthopedic patient History of fall(s);Orthopedic patient Impaired balance/gait;Impaired mobility;Orthopedic patient Impaired balance/gait  Follow up Education provided;Falls prevention discussed;Falls evaluation completed Falls evaluation completed Falls evaluation completed;Education provided Falls evaluation completed Falls evaluation completed    MEDICARE RISK AT HOME: Medicare Risk at Home Any stairs in or around the home?: No If so, are there any without handrails?: No Home free of loose throw rugs in walkways, pet beds, electrical cords, etc?: Yes Adequate lighting in your home to reduce risk of falls?: Yes Life alert?: No Use of a cane, walker or w/c?: No Grab bars in the bathroom?: Yes Shower chair or bench in shower?: No Elevated toilet seat or a handicapped toilet?: Yes  TIMED UP AND GO:  Was the test performed?  No    Cognitive Function:    11/30/2018    4:24 PM  MMSE - Mini Mental State Exam  Orientation to time 5  Orientation to Place 5  Registration 3  Attention/ Calculation 5  Recall 3  Language- name 2 objects 2  Language- repeat 1  Language- follow 3 step command 3  Language- read & follow direction 1  Write a sentence 1  Copy design 1  Total score 30        12/27/2023    7:15 PM 12/24/2022    9:18 AM 12/09/2021   11:27 AM 12/08/2020    1:50 PM 12/04/2019    2:59 PM  6CIT Screen  What Year? 0 points 0 points 0 points 0 points 0 points  What month? 0 points 0 points 0 points 0 points 0 points  What time? 0 points 0 points 0 points 0 points  0 points  Count back from 20 0 points 0 points 0 points 0 points 0 points  Months in reverse 0 points 0 points 0 points 0 points 0 points  Repeat phrase 0 points 0 points 0 points 0 points 0 points  Total Score 0 points 0 points 0 points 0 points 0 points    Immunizations Immunization History  Administered Date(s) Administered   Fluad Quad(high Dose 65+) 09/25/2019, 10/09/2020, 10/22/2021, 12/30/2022   Hep B, Unspecified 07/15/2003, 08/15/2003, 02/06/2004   Influenza, High Dose Seasonal PF 11/30/2018   Moderna Sars-Covid-2 Vaccination 02/25/2020, 03/24/2020, 10/21/2020   Pneumococcal Conjugate-13 03/15/2018   Pneumococcal Polysaccharide-23 05/30/2019   Td 07/15/2003   Tdap 08/30/2022    TDAP status: Up to date  Flu Vaccine status: Due, Education has been provided regarding the importance of this vaccine. Advised may receive this vaccine at local pharmacy or Health Dept. Aware to provide a copy of the vaccination record if obtained from local pharmacy or Health Dept. Verbalized acceptance and understanding.  Pneumococcal vaccine status: Up to date  Covid-19 vaccine status: Information provided on how to obtain vaccines.   Qualifies for Shingles Vaccine? Yes   Zostavax completed No   Shingrix Completed?: No.    Education has been provided regarding the importance of this vaccine. Patient has been advised to call insurance company to determine out of pocket expense if they have not yet received this vaccine. Advised may also receive vaccine at local pharmacy or Health Dept. Verbalized acceptance and understanding.  Screening Tests Health Maintenance  Topic Date Due   Zoster Vaccines- Shingrix (1 of 2) Never done   INFLUENZA VACCINE  07/21/2023   DEXA SCAN  07/25/2023   COVID-19 Vaccine (4 - 2024-25 season) 08/21/2023   MAMMOGRAM  08/26/2023   Medicare Annual Wellness (AWV)  12/26/2024   Colonoscopy  07/17/2025   DTaP/Tdap/Td (3 - Td or Tdap) 08/30/2032   Pneumonia Vaccine 15+  Years old  Completed   Hepatitis C Screening  Completed   HPV VACCINES  Aged Out    Health Maintenance  Health Maintenance Due  Topic Date Due   Zoster Vaccines- Shingrix (1 of 2) Never done   INFLUENZA VACCINE  07/21/2023   DEXA SCAN  07/25/2023   COVID-19 Vaccine (4 - 2024-25 season) 08/21/2023   MAMMOGRAM  08/26/2023    Colorectal cancer screening: Type of screening: Colonoscopy. Completed 07/17/20. Repeat every 5 years  Mammogram status:  Declines at this time   Bone Density status: Completed 07/22/21. Results reflect: Bone density results: OSTEOPOROSIS. Repeat every 2-3 years.  Lung Cancer Screening: (Low Dose CT Chest recommended if Age 36-80 years, 20 pack-year currently smoking OR have quit w/in 15years.) does qualify.   Lung Cancer Screening Referral: ordered 12/20/23  Additional Screening:  Hepatitis C Screening: does qualify; Completed 06/15/19  Vision Screening: Recommended annual ophthalmology exams for early detection of glaucoma and other disorders of the eye. Is the patient up to date with their annual eye exam?  Yes  Who is the provider or what is the name of the office in which the patient attends annual eye exams? Dr. Ladora If pt is not established with a provider, would they like to be referred to a provider to establish care? No .   Dental Screening: Recommended annual dental exams for proper oral hygiene  Community Resource Referral / Chronic Care Management: CRR required this visit?  No   CCM required this visit?  No     Plan:     I have personally reviewed and noted the following in the patient's chart:   Medical and social history Use of alcohol, tobacco or illicit drugs  Current medications and supplements including opioid prescriptions. Patient is not currently taking opioid prescriptions. Functional ability and status Nutritional status Physical activity Advanced directives List of other physicians Hospitalizations, surgeries, and ER  visits in previous 12 months Vitals Screenings to include cognitive, depression, and falls Referrals and appointments  In addition, I have reviewed and discussed with patient certain preventive protocols, quality metrics, and best practice recommendations. A written personalized care plan for preventive services as well as general preventive health recommendations were provided to patient.     Lavelle Pfeiffer [place], [place]   07/21/7973   After Visit Summary: (Mail) Due to this being a telephonic visit, the after visit summary with patients personalized plan was offered to patient via mail   Nurse Notes: No concerns at this time

## 2023-12-27 NOTE — Patient Instructions (Signed)
 Jody Wilson , Thank you for taking time to come for your Medicare Wellness Visit. I appreciate your ongoing commitment to your health goals. Please review the following plan we discussed and let me know if I can assist you in the future.   Referrals/Orders/Follow-Ups/Clinician Recommendations: Aim for 30 minutes of exercise or brisk walking, 6-8 glasses of water, and 5 servings of fruits and vegetables each day.  This is a list of the screening recommended for you and due dates:  Health Maintenance  Topic Date Due   Zoster (Shingles) Vaccine (1 of 2) Never done   Flu Shot  07/21/2023   DEXA scan (bone density measurement)  07/25/2023   COVID-19 Vaccine (4 - 2024-25 season) 08/21/2023   Mammogram  08/26/2023   Medicare Annual Wellness Visit  12/26/2024   Colon Cancer Screening  07/17/2025   DTaP/Tdap/Td vaccine (3 - Td or Tdap) 08/30/2032   Pneumonia Vaccine  Completed   Hepatitis C Screening  Completed   HPV Vaccine  Aged Out    Advanced directives: (ACP Link)Information on Advanced Care Planning can be found at [place]  Secretary of State Advance Health Care Directives Advance Health Care Directives ([email or web address]   Next Medicare Annual Wellness Visit scheduled for next year: Yes

## 2024-01-04 ENCOUNTER — Ambulatory Visit: Payer: Medicare Other | Admitting: Family Medicine

## 2024-01-04 ENCOUNTER — Encounter: Payer: Self-pay | Admitting: Family Medicine

## 2024-01-04 VITALS — BP 136/78 | HR 60 | Ht 65.0 in | Wt 142.0 lb

## 2024-01-04 DIAGNOSIS — F32A Depression, unspecified: Secondary | ICD-10-CM

## 2024-01-04 DIAGNOSIS — F419 Anxiety disorder, unspecified: Secondary | ICD-10-CM

## 2024-01-04 DIAGNOSIS — E782 Mixed hyperlipidemia: Secondary | ICD-10-CM

## 2024-01-04 DIAGNOSIS — Z23 Encounter for immunization: Secondary | ICD-10-CM | POA: Diagnosis not present

## 2024-01-04 DIAGNOSIS — I6521 Occlusion and stenosis of right carotid artery: Secondary | ICD-10-CM | POA: Diagnosis not present

## 2024-01-04 MED ORDER — CITALOPRAM HYDROBROMIDE 40 MG PO TABS
40.0000 mg | ORAL_TABLET | Freq: Every day | ORAL | 1 refills | Status: AC
Start: 2024-01-04 — End: ?

## 2024-01-04 MED ORDER — EZETIMIBE 10 MG PO TABS
10.0000 mg | ORAL_TABLET | Freq: Every day | ORAL | 3 refills | Status: DC
Start: 2024-01-04 — End: 2024-06-27

## 2024-01-04 MED ORDER — ALPRAZOLAM 0.5 MG PO TABS
0.5000 mg | ORAL_TABLET | Freq: Two times a day (BID) | ORAL | 2 refills | Status: DC | PRN
Start: 2024-01-04 — End: 2024-04-16

## 2024-01-04 MED ORDER — BUPROPION HCL ER (XL) 150 MG PO TB24
150.0000 mg | ORAL_TABLET | Freq: Every day | ORAL | 2 refills | Status: AC
Start: 2024-01-04 — End: ?

## 2024-01-04 NOTE — Progress Notes (Signed)
 BP 136/78   Pulse 60   Ht 5\' 5"  (1.651 m)   Wt 142 lb (64.4 kg)   SpO2 98%   BMI 23.63 kg/m    Subjective:   Patient ID: Jody Wilson, female    DOB: [birth year], 73 y.o.   MRN: [id number]  HPI: Jody Wilson is a 73 y.o. female presenting on 01/04/2024 for Medical Management of Chronic Issues, Anxiety, Depression, and Hypertension   HPI Anxiety depression recheck Patient is coming in today for anxiety and depression recheck.  She currently takes citalopram  and alprazolam  0.5 mg twice daily as needed Current rx-alprazolam  0.5 mg twice daily as needed # meds rx-60/month Effectiveness of current meds-she is not doing well with anxiety.  She has a lot of things going on and just feels like anxiety medicines are not helping or not lasting.  She has got a couple family members including his sister and her husband's niece that have been diagnosed recently with cancer and then dealing with her husband's health issues and also her own health issues including her right clavicle fracture that is not healing.  She says her anxiety will build up after about 3 to 4 hours and be a high that she just cannot settle down and wakes her up at sleep and makes her so jittery and anxious during the day Adverse reactions form meds-none Pill count performed-No Last drug screen -07/18/2023 ( high risk q98m, moderate risk q73m, low risk yearly ) Urine drug screen today- No Was the [facility] reviewed-yes  If yes were their any concerning findings? -None Controlled substance contract signed on: 07/18/2023  Relevant past medical, surgical, family and social history reviewed and updated as indicated. Interim medical history since our last visit reviewed. Allergies and medications reviewed and updated.  Review of Systems  Constitutional:  Negative for chills and fever.  Eyes:  Negative for redness and visual disturbance.  Respiratory:  Negative for chest tightness and shortness of breath.   Cardiovascular:   Negative for chest pain and leg swelling.  Genitourinary:  Negative for difficulty urinating and dysuria.  Skin:  Negative for rash.  Neurological:  Negative for dizziness, light-headedness and headaches.  Psychiatric/Behavioral:  Positive for decreased concentration. Negative for agitation, behavioral problems, self-injury, sleep disturbance and suicidal ideas. The patient is nervous/anxious.   All other systems reviewed and are negative.   Per HPI unless specifically indicated above   Allergies as of 01/04/2024       Reactions   Tetracyclines & Related Itching, Swelling        Medication List        Accurate as of January 04, 2024  9:44 AM. If you have any questions, ask your nurse or doctor.          STOP taking these medications    clobetasol  cream 0.05 % Commonly known as: TEMOVATE  Stopped by: Lucio Sabin Leroy Trim   oxyCODONE -acetaminophen  5-325 MG tablet Commonly known as: PERCOCET/ROXICET Stopped by: Lucio Sabin Zavior Thomason       TAKE these medications    ALPRAZolam  0.5 MG tablet Commonly known as: XANAX  Take 1 tablet (0.5 mg total) by mouth 2 (two) times daily as needed for anxiety.   aspirin  EC 81 MG tablet Take 1 tablet (81 mg total) by mouth 2 (two) times daily. Swallow whole.   buPROPion  150 MG 24 hr tablet Commonly known as: Wellbutrin  XL Take 1 tablet (150 mg total) by mouth daily. Started by: Lucio Sabin Renu Asby   calcium -vitamin D   500-200 MG-UNIT tablet Commonly known as: OSCAL WITH D Take 1 tablet by mouth daily with lunch.   citalopram  40 MG tablet Commonly known as: CELEXA  Take 1 tablet (40 mg total) by mouth daily.   cyanocobalamin  1000 MCG/ML injection Commonly known as: VITAMIN B12 INJECT 1 ML IM EVERY 28 DAYS   cyclobenzaprine  10 MG tablet Commonly known as: FLEXERIL  Take 1 tablet (10 mg total) by mouth 2 (two) times daily as needed.   ergocalciferol  1.25 MG (50000 UT) capsule Commonly known as: VITAMIN D2 Take 1 capsule (50,000  Units total) by mouth every 14 (fourteen) days.   ezetimibe  10 MG tablet Commonly known as: ZETIA  Take 1 tablet (10 mg total) by mouth daily.   folic acid  1 MG tablet Commonly known as: FOLVITE  Take 1 mg by mouth every other day.   hydrochlorothiazide  12.5 MG capsule Commonly known as: MICROZIDE  Take 1 capsule (12.5 mg total) by mouth daily.   levETIRAcetam  250 MG tablet Commonly known as: KEPPRA  Take 250 mg by mouth at bedtime.   lidocaine  4 % Place 1 patch onto the skin daily.   metroNIDAZOLE 0.75 % vaginal gel Commonly known as: METROGEL Place 1 Applicatorful vaginally 2 (two) times daily.   olmesartan  40 MG tablet Commonly known as: BENICAR  Take 1 tablet (40 mg total) by mouth daily. What changed: additional instructions   pantoprazole  40 MG tablet Commonly known as: PROTONIX  Take 1 tablet (40 mg total) by mouth daily.   pravastatin  40 MG tablet Commonly known as: PRAVACHOL  Take 1 tablet (40 mg total) by mouth every evening.         Objective:   BP 136/78   Pulse 60   Ht 5\' 5"  (1.651 m)   Wt 142 lb (64.4 kg)   SpO2 98%   BMI 23.63 kg/m   Wt Readings from Last 3 Encounters:  01/04/24 142 lb (64.4 kg)  12/27/23 139 lb (63 kg)  10/11/23 139 lb (63 kg)    Physical Exam Vitals and nursing note reviewed.  Constitutional:      General: She is not in acute distress.    Appearance: She is well-developed. She is not diaphoretic.  Eyes:     Conjunctiva/sclera: Conjunctivae normal.  Skin:    General: Skin is warm and dry.     Findings: No rash.  Neurological:     Mental Status: She is alert and oriented to person, place, and time.     Coordination: Coordination normal.  Psychiatric:        Mood and Affect: Mood is anxious and depressed.        Behavior: Behavior normal.        Thought Content: Thought content does not include suicidal ideation. Thought content does not include suicidal plan.       Assessment & Plan:   Problem List Items  Addressed This Visit       Other   Anxiety and depression   Relevant Medications   ALPRAZolam  (XANAX ) 0.5 MG tablet   citalopram  (CELEXA ) 40 MG tablet   buPROPion  (WELLBUTRIN  XL) 150 MG 24 hr tablet   Other Visit Diagnoses       Mixed hyperlipidemia       Relevant Medications   ezetimibe  (ZETIA ) 10 MG tablet     Stenosis of right carotid artery       Relevant Medications   ezetimibe  (ZETIA ) 10 MG tablet     Refilled escitalopram and alprazolam  but then we will add Wellbutrin  to see  if that can calm down the anxiety depression.  Follow up plan: Return in about 4 weeks (around 02/01/2024), or if symptoms worsen or fail to improve, for Anxiety depression.  Counseling provided for all of the vaccine components No orders of the defined types were placed in this encounter.   Jolyne Needs, MD [facility] 01/04/2024, 9:44 AM

## 2024-02-02 ENCOUNTER — Ambulatory Visit: Payer: Self-pay | Admitting: Family Medicine

## 2024-02-02 ENCOUNTER — Ambulatory Visit: Payer: Medicare Other | Admitting: Family Medicine

## 2024-02-02 NOTE — Telephone Encounter (Addendum)
  Additional Notes: This Triage RN attempted to call patient at this time, no answer, left voicemail. Attempt 1/3  Additional Notes: This Triage RN attempted to call patient at this time, no answer, left voicemail. Attempt 2/3  Additional Notes: This Triage RN attempted to call patient at this time, no answer, left voicemail, Attempt 3/3.

## 2024-02-10 ENCOUNTER — Ambulatory Visit: Payer: Medicare Other | Admitting: Orthopedic Surgery

## 2024-02-17 ENCOUNTER — Ambulatory Visit: Payer: Medicare Other | Admitting: Orthopedic Surgery

## 2024-02-21 ENCOUNTER — Ambulatory Visit: Payer: Medicare Other | Admitting: Orthopedic Surgery

## 2024-02-28 ENCOUNTER — Ambulatory Visit: Admitting: Orthopedic Surgery

## 2024-02-28 ENCOUNTER — Telehealth: Payer: Self-pay | Admitting: Orthopedic Surgery

## 2024-02-28 NOTE — Telephone Encounter (Signed)
 Dr. Dallas Schimke pt - pt lvm on 02/27/24, I returned her call this morning, but her vm is not setup.

## 2024-02-29 ENCOUNTER — Encounter: Payer: Self-pay | Admitting: Orthopedic Surgery

## 2024-02-29 ENCOUNTER — Ambulatory Visit: Admitting: Orthopedic Surgery

## 2024-02-29 ENCOUNTER — Other Ambulatory Visit (INDEPENDENT_AMBULATORY_CARE_PROVIDER_SITE_OTHER): Payer: Self-pay

## 2024-02-29 DIAGNOSIS — S42021D Displaced fracture of shaft of right clavicle, subsequent encounter for fracture with routine healing: Secondary | ICD-10-CM

## 2024-02-29 DIAGNOSIS — S72001D Fracture of unspecified part of neck of right femur, subsequent encounter for closed fracture with routine healing: Secondary | ICD-10-CM

## 2024-02-29 NOTE — Progress Notes (Signed)
 Orthopaedic Clinic Return  Assessment: Jody Wilson is a 73 y.o. female with the following: 1.  Right clavicle shaft fracture 2.  Right hip hemiarthroplasty  Plan: [name] sustained a right clavicle fracture a few months ago.  Radiographs remained stable.  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  This was discussed with the patient.  She does continue to have some pain.  However, she continues to be uninterested in surgery.  We briefly discussed the procedure, as well as recovery.  Questions were answered.  She will let me know if she is interested in surgery for the right clavicle.  Overall, the right hip is doing well.  She does have some decreased motion, and is unable to cross her right hip.  I did remind her that that position is to be avoided, to minimize the risk of dislocation.  She states understanding.  Overall, she is pleased with her right hip.  Nothing further is needed at this time.  She will contact clinic as needed.   Follow-up: Return if symptoms worsen or fail to improve.   Subjective:  Chief Complaint  Patient presents with   Hip Injury    Right has decreased motion states can't cross leg over    Shoulder Injury    Has swelling and pain     History of Present Illness: Jody Wilson is a 73 y.o. female who returns to clinic for evaluation of right shoulder pain.  She has pain in the right shoulder after a clavicle fracture.  She is able to do more with her right arm.  She does continue to have some pain.  She continues to have restricted motion.  In regards to her right hip, the groin pain she was complaining of has improved.  She does notice decreased motion.  She is unable to cross her leg, right over left.  She also has some difficulty putting her socks on.   Review of Systems: No fevers or chills No numbness or tingling No chest pain No shortness of breath No bowel or bladder dysfunction No GI distress No headaches   Objective: There were  no vitals taken for this visit.  Physical Exam:  Alert and oriented.  No acute distress.  She is able to ambulate without assistive device.  Right shoulder with deformity over the clavicle.  No bruising.  There is swelling in this area.  No gross motion.  Forward flexion 130 degrees.  Internal rotation to the back pocket.  Abduction of 100 degrees.  Fingers warm well-perfused.  Right hip with well-healed surgical incision.  She is able to maintain a straight leg raise.  No pain with axial loading.  She tolerates gentle range of motion.  Toes warm and well-perfused.  IMAGING: I personally ordered and reviewed the following images:  X-rays of the right clavicle were obtained in clinic today.  These are compared available x-rays.  Overall alignment remains unchanged.  Superior displacement of the distal fragment.  This is unchanged.  There is no interval callus formation.  No bony lesions.  Shoulder is reduced.  Impression: Stable right clavicle shaft fracture, with unchanged displacement and no callus formation.   AP pelvis and right hip x-rays were obtained in clinic today.  These are compared to prior x-rays.  There has been no change in overall alignment.  There is no subsidence of the prosthesis.  No fractures.  Impression: Stable right hip hemiarthroplasty without subsidence  Oliver Barre, MD 02/29/2024 12:22 PM

## 2024-03-05 ENCOUNTER — Ambulatory Visit (HOSPITAL_COMMUNITY): Payer: Medicare Other | Attending: Cardiology

## 2024-03-05 ENCOUNTER — Other Ambulatory Visit: Payer: Medicare Other

## 2024-03-05 DIAGNOSIS — I2510 Atherosclerotic heart disease of native coronary artery without angina pectoris: Secondary | ICD-10-CM | POA: Diagnosis not present

## 2024-03-05 DIAGNOSIS — I2584 Coronary atherosclerosis due to calcified coronary lesion: Secondary | ICD-10-CM | POA: Diagnosis not present

## 2024-03-05 LAB — ECHOCARDIOGRAM COMPLETE
Area-P 1/2: 3.48 cm2
S' Lateral: 2.5 cm

## 2024-03-06 ENCOUNTER — Encounter: Payer: Self-pay | Admitting: Cardiology

## 2024-03-22 ENCOUNTER — Ambulatory Visit: Payer: Medicare Other | Admitting: Cardiology

## 2024-04-06 ENCOUNTER — Ambulatory Visit: Attending: Cardiology | Admitting: Cardiology

## 2024-04-06 VITALS — BP 104/60 | HR 68 | Resp 16 | Ht 65.0 in | Wt 141.6 lb

## 2024-04-06 DIAGNOSIS — E782 Mixed hyperlipidemia: Secondary | ICD-10-CM | POA: Diagnosis not present

## 2024-04-06 DIAGNOSIS — Z87891 Personal history of nicotine dependence: Secondary | ICD-10-CM | POA: Diagnosis not present

## 2024-04-06 DIAGNOSIS — I700 Atherosclerosis of aorta: Secondary | ICD-10-CM | POA: Diagnosis not present

## 2024-04-06 DIAGNOSIS — E781 Pure hyperglyceridemia: Secondary | ICD-10-CM | POA: Diagnosis not present

## 2024-04-06 DIAGNOSIS — I2584 Coronary atherosclerosis due to calcified coronary lesion: Secondary | ICD-10-CM | POA: Diagnosis not present

## 2024-04-06 DIAGNOSIS — I2510 Atherosclerotic heart disease of native coronary artery without angina pectoris: Secondary | ICD-10-CM | POA: Diagnosis not present

## 2024-04-06 DIAGNOSIS — I10 Essential (primary) hypertension: Secondary | ICD-10-CM | POA: Diagnosis not present

## 2024-04-06 DIAGNOSIS — I6521 Occlusion and stenosis of right carotid artery: Secondary | ICD-10-CM | POA: Diagnosis not present

## 2024-04-06 MED ORDER — OLMESARTAN MEDOXOMIL 20 MG PO TABS: 20.0000 mg | ORAL_TABLET | Freq: Every evening | ORAL | 3 refills | Status: DC

## 2024-04-06 NOTE — Progress Notes (Unsigned)
 Cardiology Office Note:  .   ID:  DAYRIN STALLONE, DOB 02/27/1951, MRN [id number] PCP:  Dettinger, Lucio Sabin, MD  Former Cardiology Providers: None [facility] Providers Cardiologist:  Olinda Bertrand, DO , [facility] (established care 07/29/2020 ) Electrophysiologist:  None  Click to update primary MD,subspecialty MD or APP then REFRESH:1}    Chief Complaint  Patient presents with   Coronary atherosclerosis due to calcified coronary lesion o   Follow-up    History of Present Illness: .   ANNALEA ALGUIRE is a 73 y.o. Caucasian female whose past medical history and cardiovascular risk factors includes: hypertension, hyperlipidemia, GERD, former smoker, Thoracic aortic atherosclerosis, coronary artery calcification, advanced age, postmenopausal female.   Patient is being followed by the practice given her carotid disease.  In the past carotid duplex criteria had noted right ICA disease to be close to 50-69%.  With up titration of medical therapy disease burden did improve on follow-up studies.  She presents today for 1 year follow-up visit   Since last office visit patient denies any anginal chest pain or heart failure symptoms.  No hospitalizations or urgent care visits for cardiovascular reasons.  She has been compliant with her medical therapy.  Physical endurance remains stable.  Home blood pressures are trending soft and she complains of feeling lightheaded and dizzy.  Patient has stopped smoking completely but at times restarts smoking cigarettes due to stress.  For now she has stopped.  Review of Systems: .   Review of Systems  Cardiovascular:  Negative for chest pain, claudication, irregular heartbeat, leg swelling, near-syncope, orthopnea, palpitations, paroxysmal nocturnal dyspnea and syncope.  Respiratory:  Negative for shortness of breath.   Hematologic/Lymphatic: Negative for bleeding problem.  Neurological:  Positive for dizziness and light-headedness.    Studies Reviewed:    EKG: EKG Interpretation Date/Time:  Friday April 06 2024 10:58:40 EDT Ventricular Rate:  58 PR Interval:  132 QRS Duration:  70 QT Interval:  432 QTC Calculation: 424 R Axis:   78  Text Interpretation: Sinus bradycardia When compared with ECG of 09-Feb-2023 18:45, No significant change since last tracing Confirmed by Olinda Bertrand [phone]) on 04/06/2024 11:28:44 AM  Echocardiogram: 02/2024  1. Left ventricular ejection fraction, by estimation, is 60 to 65%. Left  ventricular ejection fraction by 3D volume is 60 %. The left ventricle has  normal function. The left ventricle has no regional wall motion  abnormalities. Left ventricular diastolic   parameters are consistent with Grade I diastolic dysfunction (impaired  relaxation). The average left ventricular global longitudinal strain is  -26.6 %. The global longitudinal strain is normal.   2. Right ventricular systolic function is normal. The right ventricular  size is normal. There is normal pulmonary artery systolic pressure. The  estimated right ventricular systolic pressure is 31.7 mmHg.   3. The mitral valve is normal in structure. Mild mitral valve  regurgitation. No evidence of mitral stenosis.   4. The aortic valve is normal in structure. Aortic valve regurgitation is  not visualized. No aortic stenosis is present.   5. The inferior vena cava is normal in size with greater than 50%  respiratory variability, suggesting right atrial pressure of 3 mmHg.   Heart Catheterization: 09/30/2020: Right dominant circulation Normal coronary arteries Normal LVEDP  Carotid duplex: 06/23/2020:  Right Carotid: Velocities in the right ICA are consistent with a 1-39% stenosis. Left Carotid: Velocities in the left ICA are consistent with a 1-39% stenosis. Vertebrals: Bilateral vertebral arteries demonstrate antegrade flow.  04/30/2021: Doppler velocity suggests stenosis in the right internal carotid artery (50-69%), lower limit of the  spectrum. Doppler velocity suggests stenosis in the left internal carotid artery (1-15%).   Carotid artery duplex 11/03/2021:  Duplex suggests stenosis in the right internal carotid artery (1-15%). Duplex suggests stenosis in the left internal carotid artery (minimal).  Carotid artery duplex 03/22/2023:  Duplex suggests stenosis in the right internal carotid artery (minimal).  Duplex suggests stenosis in the left internal carotid artery (minimal).  Antegrade right vertebral artery flow. Antegrade left vertebral artery  flow.  Compared to 11/03/2021, no significant change.   RADIOLOGY: MR Abdomen w/ and w/o contrast 06/03/2020:  1. Area of concern on prior chest CT represents geographic fat intensification on a background of global mild hepatic steatosis. No suspicious lesion in the liver with lobular contours. Correlate with any clinical or laboratory evidence of liver disease. 2. Small focal outpouching approximately 7 x 7 mm along the distal thoracic aorta may represent a penetrating atherosclerotic ulcer not associated with acute features and unchanged since previous chest CT. Consider CT angiogram follow-up in 1 year with vascular surgery consultation to guide further follow-up. 3. Large hiatal hernia extending into the chest as on the recent CT of the chest. 4. Compression fracture at T11 better demonstrated on recent chest CT. 5. Aortic atherosclerosis.   CT chest lung cancer screening: 04/25/2020: Mild coronary atherosclerosis of the LAD.  Risk Assessment/Calculations:   NA   Labs:       Latest Ref Rng & Units 06/30/2023   11:24 AM 03/31/2023   10:32 AM 03/01/2023    3:24 PM  CBC  WBC 3.4 - 10.8 x10E3/uL 6.0  5.7  6.8   Hemoglobin 11.1 - 15.9 g/dL 16.1  09.6  04.5   Hematocrit 34.0 - 46.6 % 39.8  41.4  42.3   Platelets 150 - 450 x10E3/uL 208  279  503        Latest Ref Rng & Units 06/30/2023   11:29 AM 06/30/2023   11:24 AM 03/31/2023   10:32 AM  BMP  Glucose 70 - 99  mg/dL 95  97  97   BUN 8 - 27 mg/dL 12  12  12    Creatinine 0.57 - 1.00 mg/dL 4.09  8.11  9.14   BUN/Creat Ratio 12 - 28 17  18  18    Sodium 134 - 144 mmol/L 141  139  140   Potassium 3.5 - 5.2 mmol/L 4.1  4.3  4.4   Chloride 96 - 106 mmol/L 103  102  103   CO2 20 - 29 mmol/L 23  24  23    Calcium  8.7 - 10.3 mg/dL 9.5  9.6  9.2       Latest Ref Rng & Units 06/30/2023   11:29 AM 06/30/2023   11:24 AM 03/31/2023   10:32 AM  CMP  Glucose 70 - 99 mg/dL 95  97  97   BUN 8 - 27 mg/dL 12  12  12    Creatinine 0.57 - 1.00 mg/dL 7.82  9.56  2.13   Sodium 134 - 144 mmol/L 141  139  140   Potassium 3.5 - 5.2 mmol/L 4.1  4.3  4.4   Chloride 96 - 106 mmol/L 103  102  103   CO2 20 - 29 mmol/L 23  24  23    Calcium  8.7 - 10.3 mg/dL 9.5  9.6  9.2   Total Protein 6.0 - 8.5 g/dL  6.3  6.2  Total Bilirubin 0.0 - 1.2 mg/dL  1.2  0.6   Alkaline Phos 44 - 121 IU/L  92  94   AST 0 - 40 IU/L  20  11   ALT 0 - 32 IU/L  10  8     Lab Results  Component Value Date   CHOL 176 12/30/2022   HDL 94 12/30/2022   LDLCALC 66 12/30/2022   LDLDIRECT 83 07/07/2021   TRIG 90 12/30/2022   CHOLHDL 1.9 12/30/2022   No results for input(s): "LIPOA" in the last 8760 hours. No components found for: "NTPROBNP" No results for input(s): "PROBNP" in the last 8760 hours. No results for input(s): "TSH" in the last 8760 hours.  Physical Exam:    Today's Vitals   04/06/24 1051  BP: 104/60  Pulse: 68  Resp: 16  SpO2: 96%  Weight: 141 lb 9.6 oz (64.2 kg)  Height: 5\' 5"  (1.651 m)   Body mass index is 23.56 kg/m. Wt Readings from Last 3 Encounters:  04/06/24 141 lb 9.6 oz (64.2 kg)  01/04/24 142 lb (64.4 kg)  12/27/23 139 lb (63 kg)    Physical Exam  Constitutional: No distress.  Age appropriate, hemodynamically stable, walks with cane  Neck: No JVD present.  Cardiovascular: Normal rate, regular rhythm, S1 normal, S2 normal and intact distal pulses. Exam reveals no gallop, no S3 and no S4.  No murmur  heard. Pulses:      Dorsalis pedis pulses are 1+ on the right side and 1+ on the left side.       Posterior tibial pulses are 1+ on the right side and 1+ on the left side.  Pulmonary/Chest: Effort normal and breath sounds normal. No stridor. She has no wheezes. She has no rales.  Abdominal: Soft. Bowel sounds are normal. She exhibits no distension. There is no abdominal tenderness.  Musculoskeletal:        General: No edema.     Cervical back: Neck supple.  Neurological: She is alert and oriented to person, place, and time. She has intact cranial nerves (2-12).  Skin: Skin is warm and moist.   Impression & Recommendation(s):  Impression:   ICD-10-CM   1. Coronary atherosclerosis due to calcified coronary lesion of native artery  I25.10 EKG 12-Lead   I25.84     2. Aortic atherosclerosis (HCC)  I70.0     3. Stenosis of right carotid artery  I65.21     4. Hypertriglyceridemia  E78.1     5. Mixed hyperlipidemia  E78.2     6. Essential hypertension  I10 olmesartan  (BENICAR ) 20 MG tablet    7. 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 Reemphasized importance of secondary prevention. EKG today is nonischemic. Echocardiogram March 2025: Preserved LVEF 60 to 65%, grade 1 diastolic dysfunction, no significant valvular heart disease. No additional workup warranted at this time. Chronic and stable  Stenosis of right carotid artery Chronic and improving. In 2022 patient was noted to have right ICA disease less than 70%. With uptitration of antilipid therapy, continuation of antiplatelet therapy, and for the most part smoking cessation.  Follow-up carotid duplex results of noted improvement in overall disease burden. Multiple duplexes in the recent past have noted very minimal disease bilaterally. Shared decision was to monitor  clinically.  Hypertriglyceridemia Currently on mixed hyperlipidemia Currently on Zetia  10 mg p.o. daily.  Continue pravastatin  40 mg p.o. nightly. No recent labs available for review. Has an appointment with PCP for her yearly well visit in the coming week.  She will send us  a copy of her labs for reference  Essential hypertension Chronic and progressive Complains of feeling lightheaded and dizzy. Office blood pressures are also soft. Currently takes hydrochlorothiazide  12.5 mg p.o. daily. Currently takes Benicar  20 mg p.o. twice daily. Given the fact that she is symptomatic and blood pressures are soft and she is already had falls in the recent past I have asked her to reduce her Benicar  to 20 mg p.o. every afternoon.  Continue to monitor blood pressures and to review with PCP  Former smoker Reemphasized importance of complete smoking cessation   Orders Placed:  Orders Placed This Encounter  Procedures   EKG 12-Lead     Final Medication List:    Meds ordered this encounter  Medications   olmesartan  (BENICAR ) 20 MG tablet    Sig: Take 1 tablet (20 mg total) by mouth every evening.    Dispense:  90 tablet    Refill:  3    Medications Discontinued During This Encounter  Medication Reason   levETIRAcetam  (KEPPRA ) 250 MG tablet Patient Preference   metroNIDAZOLE (METROGEL) 0.75 % vaginal gel Patient Preference   olmesartan  (BENICAR ) 40 MG tablet      Current Outpatient Medications:    ALPRAZolam  (XANAX ) 0.5 MG tablet, Take 1 tablet (0.5 mg total) by mouth 2 (two) times daily as needed for anxiety., Disp: 60 tablet, Rfl: 2   aspirin  EC 81 MG tablet, Take 1 tablet (81 mg total) by mouth 2 (two) times daily. Swallow whole., Disp: 30 tablet, Rfl: 0   buPROPion  (WELLBUTRIN  XL) 150 MG 24 hr tablet, Take 1 tablet (150 mg total) by mouth daily., Disp: 30 tablet, Rfl: 2   calcium -vitamin D  (OSCAL WITH D) 500-200 MG-UNIT tablet, Take 1 tablet by mouth daily with lunch. , Disp: , Rfl:     citalopram  (CELEXA ) 40 MG tablet, Take 1 tablet (40 mg total) by mouth daily., Disp: 90 tablet, Rfl: 1   cyanocobalamin  (VITAMIN B12) 1000 MCG/ML injection, INJECT 1 ML IM EVERY 28 DAYS, Disp: 1 mL, Rfl: 6   cyclobenzaprine  (FLEXERIL ) 10 MG tablet, Take 1 tablet (10 mg total) by mouth 2 (two) times daily as needed., Disp: 20 tablet, Rfl: 0   ergocalciferol  (VITAMIN D2) 1.25 MG (50000 UT) capsule, Take 1 capsule (50,000 Units total) by mouth every 14 (fourteen) days., Disp: 12 capsule, Rfl: 1   ezetimibe  (ZETIA ) 10 MG tablet, Take 1 tablet (10 mg total) by mouth daily., Disp: 90 tablet, Rfl: 3   folic acid  (FOLVITE ) 1 MG tablet, Take 1 mg by mouth every other day., Disp: , Rfl:    hydrochlorothiazide  (MICROZIDE ) 12.5 MG capsule, Take 1 capsule (12.5 mg total) by mouth daily., Disp: 90 capsule, Rfl: 3   lidocaine  4 %, Place 1 patch onto the skin daily., Disp: 10 patch, Rfl: 0   pantoprazole  (PROTONIX ) 40 MG tablet, Take 1 tablet (40 mg total) by mouth daily., Disp: 90 tablet, Rfl: 3   pravastatin  (PRAVACHOL ) 40 MG tablet, Take 1 tablet (40 mg total) by mouth every evening., Disp: 180 tablet, Rfl: 3   olmesartan  (BENICAR ) 20 MG tablet, Take 1 tablet (20 mg total) by mouth every evening., Disp: 90 tablet, Rfl: 3  Consent:   NA  Disposition:   Recommended follow-up on as needed basis.  Patient prefers annual follow-up visits.  Her questions and concerns were addressed to her satisfaction. She voices understanding of the recommendations provided during this encounter.    Signed, Olinda Bertrand, DO, [facility]  [facility]  [address] [place], [place] [address] 04/08/2024 7:02 PM

## 2024-04-06 NOTE — Patient Instructions (Addendum)
 Medication Instructions:  Your physician has recommended you make the following change in your medication:   CHANGE Olmesartan  (Benicar ) 20 mg once daily at night   *If you need a refill on your cardiac medications before your next appointment, please call your pharmacy*  Lab Work: None ordered today. If you have labs (blood work) drawn today and your tests are completely normal, you will receive your results only by: MyChart Message (if you have MyChart) OR A paper copy in the mail If you have any lab test that is abnormal or we need to change your treatment, we will call you to review the results.  Testing/Procedures: None ordered today.  Follow-Up: At [facility], you and your health needs are our priority.  As part of our continuing mission to provide you with exceptional heart care, we have created designated Provider Care Teams.  These Care Teams include your primary Cardiologist (physician) and Advanced Practice Providers (APPs -  Physician Assistants and Nurse Practitioners) who all work together to provide you with the care you need, when you need it.  We recommend signing up for the patient portal called "MyChart".  Sign up information is provided on this After Visit Summary.  MyChart is used to connect with patients for Virtual Visits (Telemedicine).  Patients are able to view lab/test results, encounter notes, upcoming appointments, etc.  Non-urgent messages can be sent to your provider as well.   To learn more about what you can do with MyChart, go to [email or web address].    Your next appointment:   1 year(s)  The format for your next appointment:   In Person  Provider:   Olinda Bertrand, DO{  Other Instructions   1st Floor: - Lobby - Registration  - Pharmacy  - Lab - Cafe  2nd Floor: - PV Lab - Diagnostic Testing (echo, CT, nuclear med)  3rd Floor: - Vacant  4th Floor: - TCTS (cardiothoracic surgery) - AFib Clinic - Structural Heart Clinic -  Vascular Surgery  - Vascular Ultrasound  5th Floor: - HeartCare Cardiology (general and EP) - Clinical Pharmacy for coumadin, hypertension, lipid, weight-loss medications, and med management appointments    Valet parking services will be available as well.

## 2024-04-08 ENCOUNTER — Encounter: Payer: Self-pay | Admitting: Cardiology

## 2024-04-13 ENCOUNTER — Encounter (HOSPITAL_COMMUNITY): Payer: Self-pay | Admitting: Emergency Medicine

## 2024-04-13 ENCOUNTER — Inpatient Hospital Stay (HOSPITAL_COMMUNITY)
Admission: EM | Admit: 2024-04-13 | Discharge: 2024-04-17 | DRG: 522 | Disposition: A | Discharge status: Skilled Nursing Facility | Attending: Family Medicine | Admitting: Family Medicine

## 2024-04-13 ENCOUNTER — Other Ambulatory Visit: Payer: Self-pay

## 2024-04-13 ENCOUNTER — Emergency Department (HOSPITAL_COMMUNITY)

## 2024-04-13 ENCOUNTER — Inpatient Hospital Stay (HOSPITAL_COMMUNITY)

## 2024-04-13 ENCOUNTER — Inpatient Hospital Stay (HOSPITAL_COMMUNITY): Admitting: Certified Registered"

## 2024-04-13 ENCOUNTER — Encounter (HOSPITAL_COMMUNITY)
Admission: EM | Disposition: A | Discharge status: Skilled Nursing Facility | Payer: Self-pay | Source: Home / Self Care | Attending: Family Medicine

## 2024-04-13 DIAGNOSIS — F418 Other specified anxiety disorders: Secondary | ICD-10-CM

## 2024-04-13 DIAGNOSIS — Z743 Need for continuous supervision: Secondary | ICD-10-CM | POA: Diagnosis not present

## 2024-04-13 DIAGNOSIS — K449 Diaphragmatic hernia without obstruction or gangrene: Secondary | ICD-10-CM | POA: Diagnosis not present

## 2024-04-13 DIAGNOSIS — Y92090 Kitchen in other non-institutional residence as the place of occurrence of the external cause: Secondary | ICD-10-CM | POA: Diagnosis not present

## 2024-04-13 DIAGNOSIS — E559 Vitamin D deficiency, unspecified: Secondary | ICD-10-CM | POA: Diagnosis not present

## 2024-04-13 DIAGNOSIS — Z96641 Presence of right artificial hip joint: Secondary | ICD-10-CM | POA: Diagnosis not present

## 2024-04-13 DIAGNOSIS — M80852D Other osteoporosis with current pathological fracture, left femur, subsequent encounter for fracture with routine healing: Secondary | ICD-10-CM | POA: Diagnosis not present

## 2024-04-13 DIAGNOSIS — D62 Acute posthemorrhagic anemia: Secondary | ICD-10-CM | POA: Diagnosis not present

## 2024-04-13 DIAGNOSIS — J323 Chronic sphenoidal sinusitis: Secondary | ICD-10-CM | POA: Diagnosis not present

## 2024-04-13 DIAGNOSIS — Z8781 Personal history of (healed) traumatic fracture: Secondary | ICD-10-CM

## 2024-04-13 DIAGNOSIS — I700 Atherosclerosis of aorta: Secondary | ICD-10-CM | POA: Diagnosis present

## 2024-04-13 DIAGNOSIS — I10 Essential (primary) hypertension: Secondary | ICD-10-CM | POA: Diagnosis present

## 2024-04-13 DIAGNOSIS — K219 Gastro-esophageal reflux disease without esophagitis: Secondary | ICD-10-CM | POA: Diagnosis not present

## 2024-04-13 DIAGNOSIS — Z471 Aftercare following joint replacement surgery: Secondary | ICD-10-CM | POA: Diagnosis not present

## 2024-04-13 DIAGNOSIS — E538 Deficiency of other specified B group vitamins: Secondary | ICD-10-CM | POA: Diagnosis present

## 2024-04-13 DIAGNOSIS — I951 Orthostatic hypotension: Secondary | ICD-10-CM | POA: Diagnosis not present

## 2024-04-13 DIAGNOSIS — F32A Depression, unspecified: Secondary | ICD-10-CM | POA: Diagnosis present

## 2024-04-13 DIAGNOSIS — S72002A Fracture of unspecified part of neck of left femur, initial encounter for closed fracture: Principal | ICD-10-CM | POA: Diagnosis present

## 2024-04-13 DIAGNOSIS — R6889 Other general symptoms and signs: Secondary | ICD-10-CM | POA: Diagnosis not present

## 2024-04-13 DIAGNOSIS — Z8269 Family history of other diseases of the musculoskeletal system and connective tissue: Secondary | ICD-10-CM

## 2024-04-13 DIAGNOSIS — Z981 Arthrodesis status: Secondary | ICD-10-CM | POA: Diagnosis not present

## 2024-04-13 DIAGNOSIS — S7292XA Unspecified fracture of left femur, initial encounter for closed fracture: Secondary | ICD-10-CM | POA: Diagnosis not present

## 2024-04-13 DIAGNOSIS — W010XXA Fall on same level from slipping, tripping and stumbling without subsequent striking against object, initial encounter: Secondary | ICD-10-CM | POA: Diagnosis present

## 2024-04-13 DIAGNOSIS — Z7982 Long term (current) use of aspirin: Secondary | ICD-10-CM

## 2024-04-13 DIAGNOSIS — I2510 Atherosclerotic heart disease of native coronary artery without angina pectoris: Secondary | ICD-10-CM

## 2024-04-13 DIAGNOSIS — F411 Generalized anxiety disorder: Secondary | ICD-10-CM | POA: Diagnosis present

## 2024-04-13 DIAGNOSIS — R2689 Other abnormalities of gait and mobility: Secondary | ICD-10-CM | POA: Diagnosis not present

## 2024-04-13 DIAGNOSIS — M6281 Muscle weakness (generalized): Secondary | ICD-10-CM | POA: Diagnosis not present

## 2024-04-13 DIAGNOSIS — Z803 Family history of malignant neoplasm of breast: Secondary | ICD-10-CM

## 2024-04-13 DIAGNOSIS — E876 Hypokalemia: Secondary | ICD-10-CM | POA: Diagnosis present

## 2024-04-13 DIAGNOSIS — D519 Vitamin B12 deficiency anemia, unspecified: Secondary | ICD-10-CM | POA: Diagnosis not present

## 2024-04-13 DIAGNOSIS — M810 Age-related osteoporosis without current pathological fracture: Secondary | ICD-10-CM | POA: Diagnosis present

## 2024-04-13 DIAGNOSIS — Z83438 Family history of other disorder of lipoprotein metabolism and other lipidemia: Secondary | ICD-10-CM

## 2024-04-13 DIAGNOSIS — Z79899 Other long term (current) drug therapy: Secondary | ICD-10-CM

## 2024-04-13 DIAGNOSIS — R739 Hyperglycemia, unspecified: Secondary | ICD-10-CM | POA: Diagnosis not present

## 2024-04-13 DIAGNOSIS — Z881 Allergy status to other antibiotic agents status: Secondary | ICD-10-CM

## 2024-04-13 DIAGNOSIS — S199XXA Unspecified injury of neck, initial encounter: Secondary | ICD-10-CM | POA: Diagnosis not present

## 2024-04-13 DIAGNOSIS — D649 Anemia, unspecified: Secondary | ICD-10-CM | POA: Diagnosis not present

## 2024-04-13 DIAGNOSIS — Z8249 Family history of ischemic heart disease and other diseases of the circulatory system: Secondary | ICD-10-CM | POA: Diagnosis not present

## 2024-04-13 DIAGNOSIS — F419 Anxiety disorder, unspecified: Secondary | ICD-10-CM | POA: Diagnosis not present

## 2024-04-13 DIAGNOSIS — D509 Iron deficiency anemia, unspecified: Secondary | ICD-10-CM | POA: Diagnosis present

## 2024-04-13 DIAGNOSIS — E7800 Pure hypercholesterolemia, unspecified: Secondary | ICD-10-CM | POA: Diagnosis not present

## 2024-04-13 DIAGNOSIS — Z9889 Other specified postprocedural states: Secondary | ICD-10-CM

## 2024-04-13 DIAGNOSIS — Z87891 Personal history of nicotine dependence: Secondary | ICD-10-CM | POA: Diagnosis not present

## 2024-04-13 DIAGNOSIS — Z833 Family history of diabetes mellitus: Secondary | ICD-10-CM

## 2024-04-13 DIAGNOSIS — W19XXXA Unspecified fall, initial encounter: Secondary | ICD-10-CM | POA: Diagnosis not present

## 2024-04-13 DIAGNOSIS — S0990XA Unspecified injury of head, initial encounter: Secondary | ICD-10-CM | POA: Diagnosis not present

## 2024-04-13 DIAGNOSIS — S72002D Fracture of unspecified part of neck of left femur, subsequent encounter for closed fracture with routine healing: Secondary | ICD-10-CM | POA: Diagnosis not present

## 2024-04-13 DIAGNOSIS — Z8379 Family history of other diseases of the digestive system: Secondary | ICD-10-CM

## 2024-04-13 DIAGNOSIS — Z860100 Personal history of colon polyps, unspecified: Secondary | ICD-10-CM

## 2024-04-13 DIAGNOSIS — E785 Hyperlipidemia, unspecified: Secondary | ICD-10-CM | POA: Diagnosis not present

## 2024-04-13 DIAGNOSIS — K5900 Constipation, unspecified: Secondary | ICD-10-CM | POA: Diagnosis not present

## 2024-04-13 DIAGNOSIS — R0602 Shortness of breath: Secondary | ICD-10-CM | POA: Diagnosis not present

## 2024-04-13 DIAGNOSIS — Z720 Tobacco use: Secondary | ICD-10-CM | POA: Diagnosis not present

## 2024-04-13 DIAGNOSIS — E781 Pure hyperglyceridemia: Secondary | ICD-10-CM | POA: Diagnosis present

## 2024-04-13 DIAGNOSIS — Z9181 History of falling: Secondary | ICD-10-CM | POA: Diagnosis not present

## 2024-04-13 DIAGNOSIS — Z96643 Presence of artificial hip joint, bilateral: Secondary | ICD-10-CM | POA: Diagnosis not present

## 2024-04-13 DIAGNOSIS — Z7401 Bed confinement status: Secondary | ICD-10-CM | POA: Diagnosis not present

## 2024-04-13 DIAGNOSIS — Z90710 Acquired absence of both cervix and uterus: Secondary | ICD-10-CM

## 2024-04-13 DIAGNOSIS — Z9049 Acquired absence of other specified parts of digestive tract: Secondary | ICD-10-CM

## 2024-04-13 DIAGNOSIS — R278 Other lack of coordination: Secondary | ICD-10-CM | POA: Diagnosis not present

## 2024-04-13 DIAGNOSIS — D508 Other iron deficiency anemias: Secondary | ICD-10-CM | POA: Diagnosis not present

## 2024-04-13 DIAGNOSIS — M79605 Pain in left leg: Secondary | ICD-10-CM | POA: Diagnosis not present

## 2024-04-13 DIAGNOSIS — S42001A Fracture of unspecified part of right clavicle, initial encounter for closed fracture: Secondary | ICD-10-CM | POA: Diagnosis not present

## 2024-04-13 DIAGNOSIS — Z01811 Encounter for preprocedural respiratory examination: Secondary | ICD-10-CM | POA: Diagnosis not present

## 2024-04-13 HISTORY — PX: HIP ARTHROPLASTY: SHX981

## 2024-04-13 LAB — SURGICAL PCR SCREEN
MRSA, PCR: NEGATIVE
Staphylococcus aureus: NEGATIVE

## 2024-04-13 LAB — CBC WITH DIFFERENTIAL/PLATELET
Abs Immature Granulocytes: 0.05 10*3/uL (ref 0.00–0.07)
Basophils Absolute: 0 10*3/uL (ref 0.0–0.1)
Basophils Relative: 0 %
Eosinophils Absolute: 0 10*3/uL (ref 0.0–0.5)
Eosinophils Relative: 0 %
HCT: 40.5 % (ref 36.0–46.0)
Hemoglobin: 14.1 g/dL (ref 12.0–15.0)
Immature Granulocytes: 1 %
Lymphocytes Relative: 15 %
Lymphs Abs: 1.5 10*3/uL (ref 0.7–4.0)
MCH: 34.6 pg — ABNORMAL HIGH (ref 26.0–34.0)
MCHC: 34.8 g/dL (ref 30.0–36.0)
MCV: 99.5 fL (ref 80.0–100.0)
Monocytes Absolute: 0.5 10*3/uL (ref 0.1–1.0)
Monocytes Relative: 5 %
Neutro Abs: 7.5 10*3/uL (ref 1.7–7.7)
Neutrophils Relative %: 79 %
Platelets: 210 10*3/uL (ref 150–400)
RBC: 4.07 MIL/uL (ref 3.87–5.11)
RDW: 12.2 % (ref 11.5–15.5)
WBC: 9.6 10*3/uL (ref 4.0–10.5)
nRBC: 0 % (ref 0.0–0.2)

## 2024-04-13 LAB — BASIC METABOLIC PANEL WITH GFR
Anion gap: 14 (ref 5–15)
BUN: 18 mg/dL (ref 8–23)
CO2: 21 mmol/L — ABNORMAL LOW (ref 22–32)
Calcium: 9.2 mg/dL (ref 8.9–10.3)
Chloride: 101 mmol/L (ref 98–111)
Creatinine, Ser: 0.86 mg/dL (ref 0.44–1.00)
GFR, Estimated: >60 mL/min (ref >60)
Glucose, Bld: 92 mg/dL (ref 70–99)
Potassium: 3.3 mmol/L — ABNORMAL LOW (ref 3.5–5.1)
Sodium: 136 mmol/L (ref 135–145)

## 2024-04-13 LAB — PROTIME-INR
INR: 1.1 (ref 0.8–1.2)
Prothrombin Time: 14 s (ref 11.4–15.2)

## 2024-04-13 LAB — TYPE AND SCREEN
ABO/RH(D): A POS
Antibody Screen: NEGATIVE

## 2024-04-13 LAB — VITAMIN D 25 HYDROXY (VIT D DEFICIENCY, FRACTURES): Vit D, 25-Hydroxy: 100.18 ng/mL — ABNORMAL HIGH (ref 30–100)

## 2024-04-13 LAB — MAGNESIUM: Magnesium: 1.4 mg/dL — ABNORMAL LOW (ref 1.7–2.4)

## 2024-04-13 SURGERY — HEMIARTHROPLASTY (BIPOLAR) HIP, POSTERIOR APPROACH FOR FRACTURE
Anesthesia: Spinal | Site: Hip | Laterality: Left

## 2024-04-13 MED ORDER — MORPHINE SULFATE (PF) 2 MG/ML IV SOLN
0.5000 mg | INTRAVENOUS | Status: DC | PRN
  Administered 2024-04-14 (×2): 0.5 mg via INTRAVENOUS
  Administered 2024-04-15: 1 mg via INTRAVENOUS
  Administered 2024-04-15: 0.5 mg via INTRAVENOUS
  Filled 2024-04-13 (×5): qty 1

## 2024-04-13 MED ORDER — CHLORHEXIDINE GLUCONATE 4 % EX SOLN: 60.0000 mL | Freq: Once | CUTANEOUS | Status: AC

## 2024-04-13 MED ORDER — PROPOFOL 1000 MG/100ML IV EMUL
INTRAVENOUS | Status: AC
  Filled 2024-04-13: qty 100

## 2024-04-13 MED ORDER — SODIUM CHLORIDE 0.9 % IV SOLN: INTRAVENOUS | Status: DC

## 2024-04-13 MED ORDER — TRANEXAMIC ACID-NACL 1000-0.7 MG/100ML-% IV SOLN
1000.0000 mg | INTRAVENOUS | Status: AC
  Administered 2024-04-13: 1000 mg via INTRAVENOUS

## 2024-04-13 MED ORDER — SODIUM CHLORIDE 0.9 % IR SOLN
Status: DC | PRN
Start: 2024-04-13 — End: 2024-04-13
  Administered 2024-04-13: 3000 mL

## 2024-04-13 MED ORDER — ADULT MULTIVITAMIN W/MINERALS CH
1.0000 | ORAL_TABLET | Freq: Every day | ORAL | Status: DC
  Administered 2024-04-14 – 2024-04-16 (×3): 1 via ORAL
  Filled 2024-04-13 (×5): qty 1

## 2024-04-13 MED ORDER — ORAL CARE MOUTH RINSE: 15.0000 mL | Freq: Once | OROMUCOSAL | Status: AC

## 2024-04-13 MED ORDER — ENOXAPARIN SODIUM 40 MG/0.4ML IJ SOSY
40.0000 mg | PREFILLED_SYRINGE | INTRAMUSCULAR | Status: DC
  Administered 2024-04-14 – 2024-04-17 (×4): 40 mg via SUBCUTANEOUS
  Filled 2024-04-13 (×4): qty 0.4

## 2024-04-13 MED ORDER — HYDROCODONE-ACETAMINOPHEN 7.5-325 MG PO TABS
1.0000 | ORAL_TABLET | ORAL | Status: DC | PRN
  Administered 2024-04-14 – 2024-04-15 (×5): 1 via ORAL
  Administered 2024-04-15: 2 via ORAL
  Administered 2024-04-16 (×2): 1 via ORAL
  Filled 2024-04-13 (×5): qty 1
  Filled 2024-04-13: qty 2
  Filled 2024-04-13 (×2): qty 1

## 2024-04-13 MED ORDER — PROPOFOL 500 MG/50ML IV EMUL
INTRAVENOUS | Status: AC
  Filled 2024-04-13: qty 100

## 2024-04-13 MED ORDER — CEFAZOLIN SODIUM-DEXTROSE 2-4 GM/100ML-% IV SOLN
2.0000 g | INTRAVENOUS | Status: AC
  Administered 2024-04-13: 2 g via INTRAVENOUS

## 2024-04-13 MED ORDER — BUPIVACAINE-EPINEPHRINE (PF) 0.5% -1:200000 IJ SOLN
INTRAMUSCULAR | Status: AC
  Filled 2024-04-13: qty 30

## 2024-04-13 MED ORDER — HEPARIN SODIUM (PORCINE) 5000 UNIT/ML IJ SOLN: 5000.0000 [IU] | Freq: Three times a day (TID) | INTRAMUSCULAR | Status: DC

## 2024-04-13 MED ORDER — ONDANSETRON HCL 4 MG/2ML IJ SOLN
INTRAMUSCULAR | Status: AC
  Filled 2024-04-13: qty 2

## 2024-04-13 MED ORDER — PHENYLEPHRINE 80 MCG/ML (10ML) SYRINGE FOR IV PUSH (FOR BLOOD PRESSURE SUPPORT)
PREFILLED_SYRINGE | INTRAVENOUS | Status: AC
  Filled 2024-04-13: qty 10

## 2024-04-13 MED ORDER — HYDROCODONE-ACETAMINOPHEN 5-325 MG PO TABS: 1.0000 | ORAL_TABLET | Freq: Four times a day (QID) | ORAL | Status: DC | PRN

## 2024-04-13 MED ORDER — BUPIVACAINE IN DEXTROSE 0.75-8.25 % IT SOLN
INTRATHECAL | Status: DC | PRN
  Administered 2024-04-13: 1.8 mL via INTRATHECAL

## 2024-04-13 MED ORDER — ONDANSETRON HCL 4 MG/2ML IJ SOLN
INTRAMUSCULAR | Status: DC | PRN
  Administered 2024-04-13: 4 mg via INTRAVENOUS

## 2024-04-13 MED ORDER — ONDANSETRON HCL 4 MG/2ML IJ SOLN: 4.0000 mg | Freq: Once | INTRAMUSCULAR | Status: DC | PRN

## 2024-04-13 MED ORDER — METOCLOPRAMIDE HCL 5 MG/ML IJ SOLN: 5.0000 mg | Freq: Three times a day (TID) | INTRAMUSCULAR | Status: DC | PRN

## 2024-04-13 MED ORDER — MORPHINE SULFATE (PF) 2 MG/ML IV SOLN
0.5000 mg | INTRAVENOUS | Status: DC | PRN
  Administered 2024-04-13 (×2): 0.5 mg via INTRAVENOUS
  Filled 2024-04-13 (×2): qty 1

## 2024-04-13 MED ORDER — FENTANYL CITRATE PF 50 MCG/ML IJ SOSY
PREFILLED_SYRINGE | INTRAMUSCULAR | Status: AC
  Filled 2024-04-13: qty 1

## 2024-04-13 MED ORDER — FAMOTIDINE IN NACL 20-0.9 MG/50ML-% IV SOLN
20.0000 mg | Freq: Once | INTRAVENOUS | Status: AC
  Administered 2024-04-13: 20 mg via INTRAVENOUS
  Filled 2024-04-13: qty 50

## 2024-04-13 MED ORDER — TRANEXAMIC ACID-NACL 1000-0.7 MG/100ML-% IV SOLN
1000.0000 mg | Freq: Once | INTRAVENOUS | Status: AC
  Administered 2024-04-13: 1000 mg via INTRAVENOUS
  Filled 2024-04-13: qty 100

## 2024-04-13 MED ORDER — PHENYLEPHRINE HCL-NACL 20-0.9 MG/250ML-% IV SOLN
INTRAVENOUS | Status: AC
  Filled 2024-04-13: qty 250

## 2024-04-13 MED ORDER — MORPHINE SULFATE (PF) 4 MG/ML IV SOLN
4.0000 mg | INTRAVENOUS | Status: DC | PRN
  Administered 2024-04-13: 4 mg via INTRAVENOUS
  Filled 2024-04-13: qty 1

## 2024-04-13 MED ORDER — ONDANSETRON HCL 4 MG/2ML IJ SOLN
4.0000 mg | Freq: Once | INTRAMUSCULAR | Status: DC
  Filled 2024-04-13: qty 2

## 2024-04-13 MED ORDER — HYDROCODONE-ACETAMINOPHEN 10-325 MG PO TABS
1.0000 | ORAL_TABLET | Freq: Once | ORAL | Status: DC
  Filled 2024-04-13 (×2): qty 1

## 2024-04-13 MED ORDER — HYDROCODONE-ACETAMINOPHEN 5-325 MG PO TABS
1.0000 | ORAL_TABLET | ORAL | Status: DC | PRN
  Administered 2024-04-15 – 2024-04-17 (×4): 2 via ORAL
  Filled 2024-04-13 (×4): qty 2

## 2024-04-13 MED ORDER — HYDROCHLOROTHIAZIDE 12.5 MG PO TABS
12.5000 mg | ORAL_TABLET | Freq: Every day | ORAL | Status: DC
  Administered 2024-04-13 – 2024-04-16 (×4): 12.5 mg via ORAL
  Filled 2024-04-13 (×5): qty 1

## 2024-04-13 MED ORDER — METHOCARBAMOL 500 MG PO TABS
500.0000 mg | ORAL_TABLET | Freq: Four times a day (QID) | ORAL | Status: DC | PRN
  Administered 2024-04-14 – 2024-04-17 (×2): 500 mg via ORAL
  Filled 2024-04-13 (×2): qty 1

## 2024-04-13 MED ORDER — BISACODYL 5 MG PO TBEC: 5.0000 mg | DELAYED_RELEASE_TABLET | Freq: Every day | ORAL | Status: DC | PRN

## 2024-04-13 MED ORDER — DOCUSATE SODIUM 100 MG PO CAPS
100.0000 mg | ORAL_CAPSULE | Freq: Two times a day (BID) | ORAL | Status: DC
  Administered 2024-04-13 – 2024-04-16 (×7): 100 mg via ORAL
  Filled 2024-04-13 (×8): qty 1

## 2024-04-13 MED ORDER — METHOCARBAMOL 1000 MG/10ML IJ SOLN: 500.0000 mg | Freq: Four times a day (QID) | INTRAMUSCULAR | Status: DC | PRN

## 2024-04-13 MED ORDER — POVIDONE-IODINE 10 % EX SWAB
2.0000 | Freq: Once | CUTANEOUS | Status: AC
  Administered 2024-04-13: 2 via TOPICAL

## 2024-04-13 MED ORDER — IRBESARTAN 150 MG PO TABS
150.0000 mg | ORAL_TABLET | Freq: Every evening | ORAL | Status: DC
  Administered 2024-04-13 – 2024-04-16 (×4): 150 mg via ORAL
  Filled 2024-04-13 (×4): qty 1

## 2024-04-13 MED ORDER — NAPROXEN 250 MG PO TABS
250.0000 mg | ORAL_TABLET | Freq: Two times a day (BID) | ORAL | Status: DC
  Administered 2024-04-14 – 2024-04-17 (×6): 250 mg via ORAL
  Filled 2024-04-13 (×6): qty 1

## 2024-04-13 MED ORDER — CITALOPRAM HYDROBROMIDE 20 MG PO TABS
40.0000 mg | ORAL_TABLET | Freq: Every day | ORAL | Status: DC
  Administered 2024-04-13 – 2024-04-16 (×4): 40 mg via ORAL
  Filled 2024-04-13 (×5): qty 2

## 2024-04-13 MED ORDER — EZETIMIBE 10 MG PO TABS
10.0000 mg | ORAL_TABLET | Freq: Every day | ORAL | Status: DC
  Administered 2024-04-13 – 2024-04-16 (×4): 10 mg via ORAL
  Filled 2024-04-13 (×5): qty 1

## 2024-04-13 MED ORDER — ACETAMINOPHEN 500 MG PO TABS
500.0000 mg | ORAL_TABLET | Freq: Once | ORAL | Status: DC
  Administered 2024-04-13: 500 mg via ORAL
  Filled 2024-04-13: qty 1

## 2024-04-13 MED ORDER — FENTANYL CITRATE PF 50 MCG/ML IJ SOSY: 25.0000 ug | PREFILLED_SYRINGE | INTRAMUSCULAR | Status: DC | PRN

## 2024-04-13 MED ORDER — MAGNESIUM SULFATE 4 GM/100ML IV SOLN
4.0000 g | Freq: Once | INTRAVENOUS | Status: AC
  Administered 2024-04-13: 4 g via INTRAVENOUS
  Filled 2024-04-13: qty 100

## 2024-04-13 MED ORDER — PHENOL 1.4 % MT LIQD: 1.0000 | OROMUCOSAL | Status: DC | PRN

## 2024-04-13 MED ORDER — BUPIVACAINE-EPINEPHRINE (PF) 0.5% -1:200000 IJ SOLN
INTRAMUSCULAR | Status: DC | PRN
  Administered 2024-04-13: 30 mL via PERINEURAL

## 2024-04-13 MED ORDER — MUPIROCIN 2 % EX OINT
1.0000 | TOPICAL_OINTMENT | Freq: Two times a day (BID) | CUTANEOUS | Status: DC
  Administered 2024-04-13 – 2024-04-14 (×3): 1 via NASAL
  Filled 2024-04-13: qty 22

## 2024-04-13 MED ORDER — OXYCODONE HCL 5 MG PO TABS: 5.0000 mg | ORAL_TABLET | Freq: Once | ORAL | Status: DC | PRN

## 2024-04-13 MED ORDER — METOCLOPRAMIDE HCL 10 MG PO TABS: 5.0000 mg | ORAL_TABLET | Freq: Three times a day (TID) | ORAL | Status: DC | PRN

## 2024-04-13 MED ORDER — POTASSIUM CHLORIDE 2 MEQ/ML IV SOLN
INTRAVENOUS | Status: DC
  Filled 2024-04-13 (×2): qty 1000

## 2024-04-13 MED ORDER — TRANEXAMIC ACID-NACL 1000-0.7 MG/100ML-% IV SOLN
INTRAVENOUS | Status: AC
Start: 2024-04-13 — End: 2024-04-13
  Filled 2024-04-13: qty 100

## 2024-04-13 MED ORDER — PRAVASTATIN SODIUM 40 MG PO TABS
40.0000 mg | ORAL_TABLET | Freq: Every evening | ORAL | Status: DC
  Administered 2024-04-13 – 2024-04-16 (×4): 40 mg via ORAL
  Filled 2024-04-13 (×4): qty 1

## 2024-04-13 MED ORDER — LACTATED RINGERS IV SOLN: INTRAVENOUS | Status: DC

## 2024-04-13 MED ORDER — OXYCODONE HCL 5 MG/5ML PO SOLN: 5.0000 mg | Freq: Once | ORAL | Status: DC | PRN

## 2024-04-13 MED ORDER — ACETAMINOPHEN 500 MG PO TABS
500.0000 mg | ORAL_TABLET | Freq: Four times a day (QID) | ORAL | Status: DC
  Administered 2024-04-13 – 2024-04-14 (×2): 500 mg via ORAL
  Filled 2024-04-13 (×2): qty 1

## 2024-04-13 MED ORDER — ACETAMINOPHEN 325 MG PO TABS
325.0000 mg | ORAL_TABLET | Freq: Four times a day (QID) | ORAL | Status: DC | PRN
  Administered 2024-04-17: 650 mg via ORAL
  Filled 2024-04-13: qty 2

## 2024-04-13 MED ORDER — CHLORHEXIDINE GLUCONATE 0.12 % MT SOLN
OROMUCOSAL | Status: AC
  Filled 2024-04-13: qty 15

## 2024-04-13 MED ORDER — MENTHOL 3 MG MT LOZG: 1.0000 | LOZENGE | OROMUCOSAL | Status: DC | PRN

## 2024-04-13 MED ORDER — CHLORHEXIDINE GLUCONATE 0.12 % MT SOLN
15.0000 mL | Freq: Once | OROMUCOSAL | Status: AC
  Administered 2024-04-13: 15 mL via OROMUCOSAL

## 2024-04-13 MED ORDER — CEFAZOLIN SODIUM-DEXTROSE 2-4 GM/100ML-% IV SOLN
INTRAVENOUS | Status: AC
  Filled 2024-04-13: qty 100

## 2024-04-13 MED ORDER — ASPIRIN 81 MG PO TBEC
81.0000 mg | DELAYED_RELEASE_TABLET | Freq: Every day | ORAL | Status: DC
  Administered 2024-04-13 – 2024-04-16 (×4): 81 mg via ORAL
  Filled 2024-04-13 (×5): qty 1

## 2024-04-13 MED ORDER — PROPOFOL 10 MG/ML IV BOLUS
INTRAVENOUS | Status: AC
  Filled 2024-04-13: qty 20

## 2024-04-13 MED ORDER — TRAMADOL HCL 50 MG PO TABS
50.0000 mg | ORAL_TABLET | Freq: Four times a day (QID) | ORAL | Status: DC
  Administered 2024-04-13 – 2024-04-14 (×4): 50 mg via ORAL
  Filled 2024-04-13 (×4): qty 1

## 2024-04-13 MED ORDER — PANTOPRAZOLE SODIUM 40 MG PO TBEC
40.0000 mg | DELAYED_RELEASE_TABLET | Freq: Every day | ORAL | Status: DC
  Administered 2024-04-13 – 2024-04-14 (×2): 40 mg via ORAL
  Filled 2024-04-13 (×2): qty 1

## 2024-04-13 MED ORDER — KETOROLAC TROMETHAMINE 30 MG/ML IJ SOLN
15.0000 mg | Freq: Once | INTRAMUSCULAR | Status: AC
  Administered 2024-04-13: 15 mg via INTRAVENOUS
  Filled 2024-04-13: qty 1

## 2024-04-13 MED ORDER — ONDANSETRON HCL 4 MG/2ML IJ SOLN
4.0000 mg | Freq: Four times a day (QID) | INTRAMUSCULAR | Status: DC | PRN
  Administered 2024-04-13: 4 mg via INTRAVENOUS

## 2024-04-13 MED ORDER — PHENYLEPHRINE 80 MCG/ML (10ML) SYRINGE FOR IV PUSH (FOR BLOOD PRESSURE SUPPORT)
PREFILLED_SYRINGE | INTRAVENOUS | Status: DC | PRN
Start: 2024-04-13 — End: 2024-04-13
  Administered 2024-04-13: 80 ug via INTRAVENOUS

## 2024-04-13 MED ORDER — PHENYLEPHRINE HCL-NACL 20-0.9 MG/250ML-% IV SOLN
INTRAVENOUS | Status: DC | PRN
  Administered 2024-04-13: 50 ug/min via INTRAVENOUS

## 2024-04-13 MED ORDER — ALPRAZOLAM 0.5 MG PO TABS
0.5000 mg | ORAL_TABLET | Freq: Two times a day (BID) | ORAL | Status: DC | PRN
  Administered 2024-04-13 – 2024-04-17 (×4): 0.5 mg via ORAL
  Filled 2024-04-13 (×4): qty 1

## 2024-04-13 MED ORDER — TRAZODONE HCL 50 MG PO TABS
25.0000 mg | ORAL_TABLET | Freq: Every evening | ORAL | Status: DC | PRN
  Filled 2024-04-13: qty 1

## 2024-04-13 MED ORDER — OYSTER SHELL CALCIUM/D3 500-5 MG-MCG PO TABS
1.0000 | ORAL_TABLET | Freq: Every day | ORAL | Status: DC
  Administered 2024-04-14 – 2024-04-16 (×3): 1 via ORAL
  Filled 2024-04-13 (×3): qty 1

## 2024-04-13 MED ORDER — BUPROPION HCL ER (XL) 150 MG PO TB24
150.0000 mg | ORAL_TABLET | Freq: Every day | ORAL | Status: DC
  Administered 2024-04-13 – 2024-04-16 (×4): 150 mg via ORAL
  Filled 2024-04-13 (×5): qty 1

## 2024-04-13 MED ORDER — ENSURE ENLIVE PO LIQD
237.0000 mL | Freq: Two times a day (BID) | ORAL | Status: DC
  Administered 2024-04-13 – 2024-04-16 (×2): 237 mL via ORAL
  Filled 2024-04-13 (×3): qty 237

## 2024-04-13 MED ORDER — CEFAZOLIN SODIUM-DEXTROSE 2-4 GM/100ML-% IV SOLN
2.0000 g | Freq: Four times a day (QID) | INTRAVENOUS | Status: AC
  Administered 2024-04-13 – 2024-04-14 (×2): 2 g via INTRAVENOUS
  Filled 2024-04-13 (×2): qty 100

## 2024-04-13 MED ORDER — FENTANYL CITRATE PF 50 MCG/ML IJ SOSY
50.0000 ug | PREFILLED_SYRINGE | Freq: Once | INTRAMUSCULAR | Status: AC
  Administered 2024-04-13: 50 ug via INTRAVENOUS

## 2024-04-13 MED ORDER — 0.9 % SODIUM CHLORIDE (POUR BTL) OPTIME
TOPICAL | Status: DC | PRN
Start: 2024-04-13 — End: 2024-04-13
  Administered 2024-04-13: 1000 mL

## 2024-04-13 MED ORDER — PROPOFOL 500 MG/50ML IV EMUL
INTRAVENOUS | Status: DC | PRN
  Administered 2024-04-13 (×2): 50 mg via INTRAVENOUS
  Administered 2024-04-13: 75 ug/kg/min via INTRAVENOUS

## 2024-04-13 MED ORDER — POLYETHYLENE GLYCOL 3350 17 G PO PACK: 17.0000 g | PACK | Freq: Every day | ORAL | Status: DC | PRN

## 2024-04-13 MED ORDER — LACTATED RINGERS IV SOLN: INTRAVENOUS | Status: DC | PRN

## 2024-04-13 MED ORDER — LORAZEPAM 2 MG/ML IJ SOLN: 0.5000 mg | Freq: Four times a day (QID) | INTRAMUSCULAR | Status: DC | PRN

## 2024-04-13 SURGICAL SUPPLY — 50 items
ANCHOR SUT MAYO CAT SZ1 1/2 CI (Anchor) IMPLANT
BIT DRILL 2.8X128 (BIT) ×1 IMPLANT
BLADE SAGITTAL 25.0X1.27X90 (BLADE) ×1 IMPLANT
CEMENT BONE 10-PACK (Cement) IMPLANT
CHLORAPREP W/TINT 26 (MISCELLANEOUS) ×1 IMPLANT
CLOTH BEACON ORANGE TIMEOUT ST (SAFETY) ×1 IMPLANT
COUNTER NDL MAGNETIC 40 RED (SET/KITS/TRAYS/PACK) ×1 IMPLANT
COUNTER NEEDLE MAGNETIC 40 RED (SET/KITS/TRAYS/PACK) ×1 IMPLANT
COVER LIGHT HANDLE STERIS (MISCELLANEOUS) ×2 IMPLANT
DECANTER SPIKE VIAL GLASS SM (MISCELLANEOUS) ×1 IMPLANT
DRAPE HIP W/POCKET STRL (MISCELLANEOUS) ×1 IMPLANT
DRAPE U-SHAPE 47X51 STRL (DRAPES) ×1 IMPLANT
DRSG MEPILEX BORDER 4X12 (GAUZE/BANDAGES/DRESSINGS) ×1 IMPLANT
DRSG MEPILEX POST OP 4X12 (GAUZE/BANDAGES/DRESSINGS) IMPLANT
DRSG MEPILEX SACRM 8.7X9.8 (GAUZE/BANDAGES/DRESSINGS) ×1 IMPLANT
ELECTRODE REM PT RTRN 9FT ADLT (ELECTROSURGICAL) ×1 IMPLANT
GLOVE BIOGEL PI IND STRL 7.0 (GLOVE) ×2 IMPLANT
GLOVE SS N UNI LF 8.5 STRL (GLOVE) ×1 IMPLANT
GLOVE SURG POLYISO LF SZ8 (GLOVE) ×2 IMPLANT
GOWN STRL REUS W/TWL LRG LVL3 (GOWN DISPOSABLE) ×2 IMPLANT
GOWN STRL REUS W/TWL XL LVL3 (GOWN DISPOSABLE) ×1 IMPLANT
HEAD BIPOLAR DEPUY 47 (Hips) IMPLANT
HEAD FEM STD 28X+8.5 STRL (Hips) IMPLANT
INST SET MAJOR BONE (KITS) ×1 IMPLANT
KIT TURNOVER KIT A (KITS) ×1 IMPLANT
MANIFOLD NEPTUNE II (INSTRUMENTS) ×1 IMPLANT
MARKER SKIN DUAL TIP RULER LAB (MISCELLANEOUS) ×1 IMPLANT
NDL HYPO 18GX1.5 BLUNT FILL (NEEDLE) ×1 IMPLANT
NDL HYPO 21X1.5 SAFETY (NEEDLE) ×1 IMPLANT
NEEDLE HYPO 18GX1.5 BLUNT FILL (NEEDLE) ×1 IMPLANT
NEEDLE HYPO 21X1.5 SAFETY (NEEDLE) ×1 IMPLANT
NS IRRIG 1000ML POUR BTL (IV SOLUTION) ×1 IMPLANT
PACK TOTAL JOINT (CUSTOM PROCEDURE TRAY) ×1 IMPLANT
PAD ARMBOARD POSITIONER FOAM (MISCELLANEOUS) ×1 IMPLANT
PIN STMN SNGL STERILE 9X3.6MM (PIN) ×2 IMPLANT
POSITIONER HEAD 8X9X4 ADT (SOFTGOODS) ×1 IMPLANT
SET BASIN LINEN APH (SET/KITS/TRAYS/PACK) ×1 IMPLANT
SET HNDPC FAN SPRY TIP SCT (DISPOSABLE) ×1 IMPLANT
STAPLER SKIN PROX WIDE 3.9 (STAPLE) ×1 IMPLANT
STEM FEMORAL SZ 6MM STD ACTIS (Stem) IMPLANT
SUT BRALON NAB BRD #1 30IN (SUTURE) ×2 IMPLANT
SUT ETHIBOND 5 LR DA (SUTURE) ×2 IMPLANT
SUT MNCRL 0 VIOLET CTX 36 (SUTURE) ×1 IMPLANT
SUT MON AB 0 CT1 (SUTURE) ×1 IMPLANT
SUT VIC AB 1 CT1 27XBRD ANTBC (SUTURE) ×4 IMPLANT
SYR 30ML LL (SYRINGE) ×1 IMPLANT
SYR BULB IRRIG 60ML STRL (SYRINGE) ×1 IMPLANT
TRAY FOLEY MTR SLVR 16FR STAT (SET/KITS/TRAYS/PACK) ×1 IMPLANT
WATER STERILE IRR 1000ML POUR (IV SOLUTION) ×2 IMPLANT
YANKAUER SUCT 12FT TUBE ARGYLE (SUCTIONS) ×1 IMPLANT

## 2024-04-13 NOTE — ED Provider Notes (Signed)
 [facility] AT [facility] Provider Note   CSN: [id number] Arrival date & time: 04/13/24  1308     History  Chief Complaint  Patient presents with   Jody Wilson    Jody Wilson is a 73 y.o. female.  The history is provided by the patient.  Fall  She has history of hypertension, hyperlipidemia, carotid artery disease, orthostatic hypotension and comes in following a fall at home.  She was getting something out of refrigerator when she lost her balance and fell injuring her left hip.  She also endorses possible loss of consciousness.  She is not on any anticoagulants, and the only antiplatelet agent she is on is low-dose aspirin .   Home Medications Prior to Admission medications   Medication Sig Start Date End Date Taking? Authorizing Provider  ALPRAZolam  (XANAX ) 0.5 MG tablet Take 1 tablet (0.5 mg total) by mouth 2 (two) times daily as needed for anxiety. 01/04/24   Dettinger, Lucio Sabin, MD  aspirin  EC 81 MG tablet Take 1 tablet (81 mg total) by mouth 2 (two) times daily. Swallow whole. 02/13/23   Lesa Rape, MD  buPROPion  (WELLBUTRIN  XL) 150 MG 24 hr tablet Take 1 tablet (150 mg total) by mouth daily. 01/04/24   Dettinger, Lucio Sabin, MD  calcium -vitamin D  (OSCAL WITH D) 500-200 MG-UNIT tablet Take 1 tablet by mouth daily with lunch.     [provider]  citalopram  (CELEXA ) 40 MG tablet Take 1 tablet (40 mg total) by mouth daily. 01/04/24   Dettinger, Lucio Sabin, MD  cyanocobalamin  (VITAMIN B12) 1000 MCG/ML injection INJECT 1 ML IM EVERY 28 DAYS 10/31/23   Paulett Boros, MD  cyclobenzaprine  (FLEXERIL ) 10 MG tablet Take 1 tablet (10 mg total) by mouth 2 (two) times daily as needed. 10/25/23   Tonita Frater, MD  ergocalciferol  (VITAMIN D2) 1.25 MG (50000 UT) capsule Take 1 capsule (50,000 Units total) by mouth every 14 (fourteen) days. 07/07/23   Sonnie Dusky, PA-C  ezetimibe  (ZETIA ) 10 MG tablet Take 1 tablet (10 mg total) by mouth daily.  01/04/24   Dettinger, Lucio Sabin, MD  folic acid  (FOLVITE ) 1 MG tablet Take 1 mg by mouth every other day.    [provider]  hydrochlorothiazide  (MICROZIDE ) 12.5 MG capsule Take 1 capsule (12.5 mg total) by mouth daily. 03/01/23   Eliodoro Guerin, DO  lidocaine  4 % Place 1 patch onto the skin daily. 09/28/23   Rolinda Climes, DO  olmesartan  (BENICAR ) 20 MG tablet Take 1 tablet (20 mg total) by mouth every evening. 04/06/24   Tolia, Sunit, DO  pantoprazole  (PROTONIX ) 40 MG tablet Take 1 tablet (40 mg total) by mouth daily. 03/01/23   Eliodoro Guerin, DO  pravastatin  (PRAVACHOL ) 40 MG tablet Take 1 tablet (40 mg total) by mouth every evening. 03/01/23   Eliodoro Guerin, DO      Allergies    Tetracyclines & related    Review of Systems   Review of Systems  All other systems reviewed and are negative.   Physical Exam Updated Vital Signs BP (!) 157/94   Pulse 82   Temp 98.4 F (36.9 C) (Oral)   Resp 19   Ht 5\' 5"  (1.651 m)   Wt 64.4 kg   SpO2 96%   BMI 23.63 kg/m  Physical Exam Vitals and nursing note reviewed.   73 year old female, resting comfortably and in no acute distress. Vital signs are significant for elevated blood pressure.  Oxygen saturation is 97%, which is normal. Head is normocephalic and atraumatic. PERRLA, EOMI.  Neck is immobilized in a stiff cervical collar and is nontender. Back is nontender. Lungs are clear without rales, wheezes, or rhonchi. Chest is nontender. Heart has regular rate and rhythm without murmur. Abdomen is soft, flat, nontender. Extremities: Left leg is shortened and externally rotated.  There is marked tenderness to palpation of the left hip, and marked pain with any movement of the left hip.  Distal neurovascular exam is intact with strong dorsalis pedis pulse and normal sensation and capillary refill. Skin is warm and dry without rash. Neurologic: Mental status is normal, cranial nerves are intact, moves all extremities  equally except for left leg which she does not move because of pain.  ED Results / Procedures / Treatments   Labs (all labs ordered are listed, but only abnormal results are displayed) Labs Reviewed  BASIC METABOLIC PANEL WITH GFR - Abnormal; Notable for the following components:      Result Value   Potassium 3.3 (*)    CO2 21 (*)    All other components within normal limits  CBC WITH DIFFERENTIAL/PLATELET - Abnormal; Notable for the following components:   MCH 34.6 (*)    All other components within normal limits  [facility]  TYPE AND SCREEN    EKG LARIN, DEPAOLI ZO:[id number] 13-Apr-2024 05:23:30 [facility] ROUTINE RECORD [birth year] (72 yr) Female Caucasian Room:ER4 Loc:0 Technician: [id number] Test ind: Comment 1:: Comment 2:: Comment 3:: Comment 4:: Vent. rate 78 BPM PR interval 143 ms QRS duration 95 ms QT/QTcB 386/440 ms P-R-T axes 78 78 57 Sinus rhythm Atrial premature complex Consider left atrial enlargement Borderline T abnormalities, anterior leads When compared with ECG of 04/06/2024, No significant change was found Confirmed by Alissa April ([id number]) on 04/13/2024 6:56:10 AM Confirmed By: Alissa April  Radiology CT Head Wo Contrast Result Date: 04/13/2024 CLINICAL DATA:  Neck trauma.  Unwitnessed fall with head trauma. EXAM: CT HEAD WITHOUT CONTRAST CT CERVICAL SPINE WITHOUT CONTRAST TECHNIQUE: Multidetector CT imaging of the head and cervical spine was performed following the standard protocol without intravenous contrast. Multiplanar CT image reconstructions of the cervical spine were also generated. RADIATION DOSE REDUCTION: This exam was performed according to the departmental dose-optimization program which includes automated exposure control, adjustment of the mA and/or kV according to patient size and/or use of iterative reconstruction technique. COMPARISON:  09/28/2023 FINDINGS: CT HEAD FINDINGS Brain: No evidence of acute infarction,  hemorrhage, hydrocephalus, extra-axial collection or mass lesion/mass effect. Vascular: No hyperdense vessel or unexpected calcification. Skull: Normal. Negative for fracture or focal lesion. Sinuses/Orbits: Chronic left sphenoid sinusitis with polypoid density and sclerotic wall thickening. CT CERVICAL SPINE FINDINGS Alignment: No traumatic malalignment Skull base and vertebrae: No acute fracture. No primary bone lesion or focal pathologic process. Soft tissues and spinal canal: No prevertebral fluid or swelling. No visible canal hematoma. Disc levels:  C5-6 and C6-7 ACDF with solid arthrodesis. Upper chest: No evidence of acute injury. Old injury at the right coracoclavicular ligament with calcification. IMPRESSION: No evidence of acute intracranial or cervical spine injury. Electronically Signed   By: Ronnette Coke M.D.   On: 04/13/2024 06:38   CT Cervical Spine Wo Contrast Result Date: 04/13/2024 CLINICAL DATA:  Neck trauma.  Unwitnessed fall with head trauma. EXAM: CT HEAD WITHOUT CONTRAST CT CERVICAL SPINE WITHOUT CONTRAST TECHNIQUE: Multidetector CT imaging of the head and cervical spine was performed following the standard protocol without intravenous contrast. Multiplanar CT  image reconstructions of the cervical spine were also generated. RADIATION DOSE REDUCTION: This exam was performed according to the departmental dose-optimization program which includes automated exposure control, adjustment of the mA and/or kV according to patient size and/or use of iterative reconstruction technique. COMPARISON:  09/28/2023 FINDINGS: CT HEAD FINDINGS Brain: No evidence of acute infarction, hemorrhage, hydrocephalus, extra-axial collection or mass lesion/mass effect. Vascular: No hyperdense vessel or unexpected calcification. Skull: Normal. Negative for fracture or focal lesion. Sinuses/Orbits: Chronic left sphenoid sinusitis with polypoid density and sclerotic wall thickening. CT CERVICAL SPINE FINDINGS  Alignment: No traumatic malalignment Skull base and vertebrae: No acute fracture. No primary bone lesion or focal pathologic process. Soft tissues and spinal canal: No prevertebral fluid or swelling. No visible canal hematoma. Disc levels:  C5-6 and C6-7 ACDF with solid arthrodesis. Upper chest: No evidence of acute injury. Old injury at the right coracoclavicular ligament with calcification. IMPRESSION: No evidence of acute intracranial or cervical spine injury. Electronically Signed   By: Ronnette Coke M.D.   On: 04/13/2024 06:38    Procedures Procedures    Medications Ordered in ED Medications  morphine  (PF) 4 MG/ML injection 4 mg (4 mg Intravenous Given 04/13/24 0553)  ondansetron  (ZOFRAN ) injection 4 mg (4 mg Intravenous Patient Refused/Not Given 04/13/24 0557)    ED Course/ Medical Decision Making/ A&P                                 Medical Decision Making Amount and/or Complexity of Data Reviewed Labs: ordered. Radiology: ordered.  Risk Prescription drug management. Decision regarding hospitalization.   Fall with injury to left hip.  Exam is concerning for fracture.  I have ordered morphine  for pain and I have ordered x-rays of left hip as well as CT scans of head and cervical spine.  CT scans of head and cervical spine show no acute intracranial injury and no cervical spine injury.  I have independently viewed these images, and agree with radiologist's interpretation.  Hip x-ray shows femoral neck fracture per my interpretation with radiologist interpretation pending.  Chest x-ray shows no active cardiopulmonary disease per my reading, with radiologist interpretation pending.  I have discussed the case with Dr. Lincoln Renshaw of [facility], who agrees to admit the patient.  I have discussed the case with Dr. Phyllis Breeze of orthopedic service who agrees to see the patient in consultation with plan for surgical management.  Final Clinical Impression(s) / ED Diagnoses Final  diagnoses:  Closed fracture of neck of left femur, initial encounter (HCC)  Hypokalemia    Rx / DC Orders ED Discharge Orders     None         Alissa April, MD 04/13/24 [phone]

## 2024-04-13 NOTE — Anesthesia Postprocedure Evaluation (Signed)
 Anesthesia Post Note  Patient: Jody Wilson  Procedure(s) Performed: HEMIARTHROPLASTY (BIPOLAR) HIP, DIRECT LATERAL  FOR FRACTURE (Left: Hip)  Patient location during evaluation: PACU Anesthesia Type: Spinal Level of consciousness: awake and alert Pain management: pain level controlled Vital Signs Assessment: post-procedure vital signs reviewed and stable Respiratory status: spontaneous breathing, nonlabored ventilation, respiratory function stable and patient connected to nasal cannula oxygen Cardiovascular status: blood pressure returned to baseline and stable Postop Assessment: no apparent nausea or vomiting Anesthetic complications: no   No notable events documented.   Last Vitals:  Vitals:   04/13/24 1422 04/13/24 1700  BP: 114/77 (!) 77/49  Pulse: 78 70  Resp: 15 15  Temp:  (!) 36.4 C  SpO2:  99%    Last Pain:  Vitals:   04/13/24 1700  TempSrc:   PainSc: 0-No pain                 Coretha Dew

## 2024-04-13 NOTE — ED Notes (Signed)
 O2 saturation noted at 84%. Pt NAD with no complaints of shortness of breath or breathing problems. No cyanosis or shallow breathing noted. O2 administered at 2lpm via [place].

## 2024-04-13 NOTE — ED Notes (Signed)
 Patient transported to CT

## 2024-04-13 NOTE — Progress Notes (Signed)
 Initial Nutrition Assessment  DOCUMENTATION CODES:   Not applicable  INTERVENTION:   Ensure Enlive po BID, each supplement provides 350 kcal and 20 grams of protein. MVI with minerals daily.  NUTRITION DIAGNOSIS:   Increased nutrient needs related to hip fracture, post-op healing as evidenced by estimated needs.  GOAL:   Patient will meet greater than or equal to 90% of their needs  MONITOR:   PO intake, Supplement acceptance  REASON FOR ASSESSMENT:   Consult Hip fracture protocol  ASSESSMENT:   73 yo female admitted with L femoral neck fracture. PMH includes HTN, depression, HLD, GERD, anemia, orthostatic hypotension, aortic atherosclerosis, CAD.  Patient is currently in the OR for L hip hemiarthroplasty. Unable to speak with her or obtain nutrition hx at this time.  Labs reviewed. K 3.3, mag 1.4  Medications reviewed and include oscal with D, hydrochlorothiazide , zofran , protonix , IV antibiotics.  Weight history reviewed. No significant weight changes noted over the past year.   NUTRITION - FOCUSED PHYSICAL EXAM:  Unable to complete  Diet Order:   Diet Order             Diet NPO time specified Except for: Sips with Meds, Ice Chips  Diet effective now                   EDUCATION NEEDS:   No education needs have been identified at this time  Skin:  Skin Assessment: Reviewed RN Assessment  Last BM:  4/24  Height:   Ht Readings from Last 1 Encounters:  04/13/24 5\' 5"  (1.651 m)    Weight:   Wt Readings from Last 1 Encounters:  04/13/24 64.4 kg    Ideal Body Weight:  56.8 kg  BMI:  Body mass index is 23.63 kg/m.  Estimated Nutritional Needs:   Kcal:  1650-1850  Protein:  80-90 gm  Fluid:  1.7-1.9 L   Barnet Boots RD, LDN, CNSC Contact via secure chat. If unavailable, use group chat "RD Inpatient."

## 2024-04-13 NOTE — Anesthesia Preprocedure Evaluation (Signed)
 Anesthesia Evaluation  Patient identified by MRN, date of birth, ID band Patient awake    Reviewed: Allergy & Precautions, H&P , NPO status , Patient's Chart, lab work & pertinent test results, reviewed documented beta blocker date and time   Airway Mallampati: II  TM Distance: >3 FB Neck ROM: full    Dental no notable dental hx.    Pulmonary neg pulmonary ROS, Patient abstained from smoking., former smoker   Pulmonary exam normal breath sounds clear to auscultation       Cardiovascular Exercise Tolerance: Good hypertension,  Rhythm:regular Rate:Normal     Neuro/Psych  PSYCHIATRIC DISORDERS Anxiety Depression     Neuromuscular disease    GI/Hepatic Neg liver ROS, hiatal hernia,GERD  ,,  Endo/Other  negative endocrine ROS    Renal/GU negative Renal ROS  negative genitourinary   Musculoskeletal   Abdominal   Peds  Hematology  (+) Blood dyscrasia, anemia   Anesthesia Other Findings   Reproductive/Obstetrics negative OB ROS                             Anesthesia Physical Anesthesia Plan  ASA: 3  Anesthesia Plan: Spinal   Post-op Pain Management:    Induction:   PONV Risk Score and Plan: Propofol  infusion  Airway Management Planned:   Additional Equipment:   Intra-op Plan:   Post-operative Plan:   Informed Consent: I have reviewed the patients History and Physical, chart, labs and discussed the procedure including the risks, benefits and alternatives for the proposed anesthesia with the patient or authorized representative who has indicated his/her understanding and acceptance.     Dental Advisory Given  Plan Discussed with: CRNA  Anesthesia Plan Comments:        Anesthesia Quick Evaluation

## 2024-04-13 NOTE — H&P (Signed)
 History and Physical  [facility]  Jody Wilson KGM:[id number] DOB: 05-Jan-1951 DOA: 04/13/2024  PCP: Dettinger, Lucio Sabin, MD  Patient coming from: home by [facility]  Level of care: Med-Surg  I have personally briefly reviewed patient's old medical records in [facility]  Chief Complaint: left leg pain  HPI: Jody Wilson is a 73 year old female with history of anemia, GAD, depression, aortic atherosclerosis, GERD, hyperlipidemia, tobacco, hypertension, orthostatic hypotension, osteoporosis, status post right hip fracture from a mechanical fall in February 2024 and underwent a right hip hemiarthroplasty by Dr. Ernesta Heading.  She presented to ED by EMS from home after an unwitnessed mechanical fall.  Patient denies hitting head and not sure about having loss of consciousness.  She complained of left hip pain and headache.  She says she was getting something out of the refrigerator when she lost her balance and fell.  She reports she takes low-dose aspirin  daily.  She had a CT scan of her head and cervical spine that showed no acute intracranial injury and no cervical spine injury.  Hip x-ray show left femoral neck fracture.  Dr. Phyllis Breeze with orthopedic service was consulted and recommended admission for surgical management.    Past Medical History:  Diagnosis Date   Anemia    Anxiety    Aortic atherosclerosis (HCC)    Carotid artery disease (HCC)    Depression    GERD (gastroesophageal reflux disease)    Hyperlipidemia    Hypertension    Orthostatic hypotension    Osteoporosis     Past Surgical History:  Procedure Laterality Date   ABDOMINAL HYSTERECTOMY     BACK SURGERY     CHOLECYSTECTOMY     FOOT SURGERY     7 in the past   HIP ARTHROPLASTY Right 02/11/2023   Procedure: ARTHROPLASTY BIPOLAR HIP (HEMIARTHROPLASTY);  Surgeon: Tonita Frater, MD;  Location: [facility];  Service: Orthopedics;  Laterality: Right;   IR GENERIC HISTORICAL  12/09/2016   IR RADIOLOGIST EVAL & MGMT  12/09/2016 [facility]INTER[facility] RAD   IR GENERIC HISTORICAL  12/10/2016   IR VERTEBROPLASTY CERV/THOR BX INC UNI/BIL INC/INJECT/IMAGING 12/10/2016 [facility]INTER[facility] RAD   LEFT HEART CATH AND CORONARY ANGIOGRAPHY N/A 09/30/2020   Procedure: LEFT HEART CATH AND CORONARY ANGIOGRAPHY;  Surgeon: Cody Das, MD;  Location: [facility] INVASIVE CV LAB;  Service: Cardiovascular;  Laterality: N/A;   TRACHEOSTOMY TUBE PLACEMENT  1982     reports that she quit smoking about 13 months ago. Her smoking use included cigarettes. She started smoking about 26 years ago. She has a 6.3 pack-year smoking history. She has never been exposed to tobacco smoke. She has never used smokeless tobacco. She reports that she does not currently use alcohol. She reports that she does not use drugs.  Allergies  Allergen Reactions   Tetracyclines & Related Itching and Swelling    Family History  Problem Relation Age of Onset   Heart disease Mother    Hypertension Mother    Hyperlipidemia Mother    Diabetes Mother    Congestive Heart Failure Mother    Aneurysm Paternal Uncle    Heart attack Maternal Grandmother    Heart attack Maternal Grandfather    Hypertension Son    Other Son 54       Kienbock's Disease   Liver disease Son 11       Gilbert's Disease   Lupus Half-Sister    Atrial fibrillation Half-Sister    Breast cancer Niece  Prior to Admission medications   Medication Sig Start Date End Date Taking? Authorizing Provider  ALPRAZolam  (XANAX ) 0.5 MG tablet Take 1 tablet (0.5 mg total) by mouth 2 (two) times daily as needed for anxiety. 01/04/24   Dettinger, Lucio Sabin, MD  aspirin  EC 81 MG tablet Take 1 tablet (81 mg total) by mouth 2 (two) times daily. Swallow whole. 02/13/23   Lesa Rape, MD  buPROPion  (WELLBUTRIN  XL) 150 MG 24 hr tablet Take 1 tablet (150 mg total) by mouth daily. 01/04/24   Dettinger, Lucio Sabin, MD  calcium -vitamin D  (OSCAL WITH D) 500-200 MG-UNIT tablet Take 1 tablet by mouth daily with lunch.     [provider]  citalopram  (CELEXA ) 40 MG tablet Take 1 tablet (40 mg total) by mouth daily. 01/04/24   Dettinger, Lucio Sabin, MD  cyanocobalamin  (VITAMIN B12) 1000 MCG/ML injection INJECT 1 ML IM EVERY 28 DAYS 10/31/23   Paulett Boros, MD  cyclobenzaprine  (FLEXERIL ) 10 MG tablet Take 1 tablet (10 mg total) by mouth 2 (two) times daily as needed. 10/25/23   Tonita Frater, MD  ergocalciferol  (VITAMIN D2) 1.25 MG (50000 UT) capsule Take 1 capsule (50,000 Units total) by mouth every 14 (fourteen) days. 07/07/23   Sonnie Dusky, PA-C  ezetimibe  (ZETIA ) 10 MG tablet Take 1 tablet (10 mg total) by mouth daily. 01/04/24   Dettinger, Lucio Sabin, MD  folic acid  (FOLVITE ) 1 MG tablet Take 1 mg by mouth every other day.    [provider]  hydrochlorothiazide  (MICROZIDE ) 12.5 MG capsule Take 1 capsule (12.5 mg total) by mouth daily. 03/01/23   Eliodoro Guerin, DO  lidocaine  4 % Place 1 patch onto the skin daily. 09/28/23   Rolinda Climes, DO  olmesartan  (BENICAR ) 20 MG tablet Take 1 tablet (20 mg total) by mouth every evening. 04/06/24   Tolia, Sunit, DO  pantoprazole  (PROTONIX ) 40 MG tablet Take 1 tablet (40 mg total) by mouth daily. 03/01/23   Eliodoro Guerin, DO  pravastatin  (PRAVACHOL ) 40 MG tablet Take 1 tablet (40 mg total) by mouth every evening. 03/01/23   Eliodoro Guerin, DO    Physical Exam: Vitals:   04/13/24 0524 04/13/24 0530 04/13/24 0553 04/13/24 0600  BP:  (!) 151/88  (!) 157/94  Pulse:  72 74 82  Resp:  (!) 21 19 19   Temp:      TempSrc:      SpO2:  96% 96% 96%  Weight: 64.4 kg     Height: 5\' 5"  (1.651 m)       Constitutional: NAD, calm, uncomfortable Eyes: PERRL, lids and conjunctivae normal ENMT: Mucous membranes are moist. Posterior pharynx clear of any exudate or lesions.Normal dentition.  Neck: normal, supple, no masses, no thyromegaly Respiratory: clear to auscultation bilaterally, no wheezing, no crackles. Normal respiratory effort. No accessory  muscle use.  Cardiovascular: normal s1, s2 sounds, no murmurs / rubs / gallops. No extremity edema. 2+ pedal pulses. No carotid bruits.  Abdomen: no tenderness, no masses palpated. No hepatosplenomegaly. Bowel sounds positive.  Musculoskeletal: no clubbing / cyanosis. Left leg shortened and rotated. Normal muscle tone.  Skin: no rashes, lesions, ulcers. No induration Neurologic: CN 2-12 grossly intact. Sensation intact, DTR normal. Strength 5/5 in all 4.  Psychiatric: Normal judgment and insight. Alert and oriented x 3. Normal mood.   Labs on Admission: I have personally reviewed following labs and imaging studies  CBC: Recent Labs  Lab 04/13/24 0604  WBC 9.6  NEUTROABS 7.5  HGB 14.1  HCT 40.5  MCV 99.5  PLT 210   Basic Metabolic Panel: Recent Labs  Lab 04/13/24 0604  NA 136  K 3.3*  CL 101  CO2 21*  GLUCOSE 92  BUN 18  CREATININE 0.86  CALCIUM  9.2   GFR: Estimated Creatinine Clearance: 53.2 mL/min (by C-G formula based on SCr of 0.86 mg/dL). Liver Function Tests: No results for input(s): "AST", "ALT", "ALKPHOS", "BILITOT", "PROT", "ALBUMIN" in the last 168 hours. No results for input(s): "LIPASE", "AMYLASE" in the last 168 hours. No results for input(s): "AMMONIA" in the last 168 hours. Coagulation Profile: Recent Labs  Lab 04/13/24 0604  INR 1.1   Cardiac Enzymes: No results for input(s): "CKTOTAL", "CKMB", "CKMBINDEX", "TROPONINI" in the last 168 hours. BNP (last 3 results) No results for input(s): "PROBNP" in the last 8760 hours. HbA1C: No results for input(s): "HGBA1C" in the last 72 hours. CBG: No results for input(s): "GLUCAP" in the last 168 hours. Lipid Profile: No results for input(s): "CHOL", "HDL", "LDLCALC", "TRIG", "CHOLHDL", "LDLDIRECT" in the last 72 hours. Thyroid  Function Tests: No results for input(s): "TSH", "T4TOTAL", "FREET4", "T3FREE", "THYROIDAB" in the last 72 hours. Anemia Panel: No results for input(s): "VITAMINB12", "FOLATE",  "FERRITIN", "TIBC", "IRON", "RETICCTPCT" in the last 72 hours. Urine analysis:    Component Value Date/Time   COLORURINE YELLOW 07/11/2020 2212   APPEARANCEUR Clear 01/22/2022 1411   LABSPEC 1.008 07/11/2020 2212   PHURINE 6.0 07/11/2020 2212   GLUCOSEU Negative 01/22/2022 1411   HGBUR NEGATIVE 07/11/2020 2212   BILIRUBINUR Negative 01/22/2022 1411   KETONESUR NEGATIVE 07/11/2020 2212   PROTEINUR Negative 01/22/2022 1411   PROTEINUR NEGATIVE 07/11/2020 2212   UROBILINOGEN negative 04/11/2014 1713   NITRITE Negative 01/22/2022 1411   NITRITE NEGATIVE 07/11/2020 2212   LEUKOCYTESUR Negative 01/22/2022 1411   LEUKOCYTESUR NEGATIVE 07/11/2020 2212    Radiological Exams on Admission: CT Head Wo Contrast Result Date: 04/13/2024 CLINICAL DATA:  Neck trauma.  Unwitnessed fall with head trauma. EXAM: CT HEAD WITHOUT CONTRAST CT CERVICAL SPINE WITHOUT CONTRAST TECHNIQUE: Multidetector CT imaging of the head and cervical spine was performed following the standard protocol without intravenous contrast. Multiplanar CT image reconstructions of the cervical spine were also generated. RADIATION DOSE REDUCTION: This exam was performed according to the departmental dose-optimization program which includes automated exposure control, adjustment of the mA and/or kV according to patient size and/or use of iterative reconstruction technique. COMPARISON:  09/28/2023 FINDINGS: CT HEAD FINDINGS Brain: No evidence of acute infarction, hemorrhage, hydrocephalus, extra-axial collection or mass lesion/mass effect. Vascular: No hyperdense vessel or unexpected calcification. Skull: Normal. Negative for fracture or focal lesion. Sinuses/Orbits: Chronic left sphenoid sinusitis with polypoid density and sclerotic wall thickening. CT CERVICAL SPINE FINDINGS Alignment: No traumatic malalignment Skull base and vertebrae: No acute fracture. No primary bone lesion or focal pathologic process. Soft tissues and spinal canal: No  prevertebral fluid or swelling. No visible canal hematoma. Disc levels:  C5-6 and C6-7 ACDF with solid arthrodesis. Upper chest: No evidence of acute injury. Old injury at the right coracoclavicular ligament with calcification. IMPRESSION: No evidence of acute intracranial or cervical spine injury. Electronically Signed   By: Ronnette Coke M.D.   On: 04/13/2024 06:38   CT Cervical Spine Wo Contrast Result Date: 04/13/2024 CLINICAL DATA:  Neck trauma.  Unwitnessed fall with head trauma. EXAM: CT HEAD WITHOUT CONTRAST CT CERVICAL SPINE WITHOUT CONTRAST TECHNIQUE: Multidetector CT imaging of the head and cervical spine was performed following the standard protocol without intravenous  contrast. Multiplanar CT image reconstructions of the cervical spine were also generated. RADIATION DOSE REDUCTION: This exam was performed according to the departmental dose-optimization program which includes automated exposure control, adjustment of the mA and/or kV according to patient size and/or use of iterative reconstruction technique. COMPARISON:  09/28/2023 FINDINGS: CT HEAD FINDINGS Brain: No evidence of acute infarction, hemorrhage, hydrocephalus, extra-axial collection or mass lesion/mass effect. Vascular: No hyperdense vessel or unexpected calcification. Skull: Normal. Negative for fracture or focal lesion. Sinuses/Orbits: Chronic left sphenoid sinusitis with polypoid density and sclerotic wall thickening. CT CERVICAL SPINE FINDINGS Alignment: No traumatic malalignment Skull base and vertebrae: No acute fracture. No primary bone lesion or focal pathologic process. Soft tissues and spinal canal: No prevertebral fluid or swelling. No visible canal hematoma. Disc levels:  C5-6 and C6-7 ACDF with solid arthrodesis. Upper chest: No evidence of acute injury. Old injury at the right coracoclavicular ligament with calcification. IMPRESSION: No evidence of acute intracranial or cervical spine injury. Electronically Signed   By:  Ronnette Coke M.D.   On: 04/13/2024 06:38    EKG: Independently reviewed.   Assessment/Plan Principal Problem:   Closed left hip fracture (HCC) Active Problems:   Hypertension   Hyperlipidemia with target LDL less than 100   GERD (gastroesophageal reflux disease)   B12 deficiency   Iron deficiency anemia   Tobacco abuse   Anxiety and depression   Thoracic aortic atherosclerosis (HCC)   History of colonic polyps   Hypertriglyceridemia   Hypokalemia   Acute left hip femoral neck fracture s/p mechanical fall - admit to med surg unit  - consultation with orthopedics Dr. Phyllis Breeze - hip fracture orderset bundle initiated - IV pain and nausea medication - NPO until evaluation by orthopedics - check 25-OH vitamin D    Hypokalemia - check Mg - adding potassium to IV fluids - recheck BMP in AM   Dyslipidemia - plan to resume home oral meds when reconciled  GERD - IV pepcid  ordered   Essential hypertension  - resume home medication when reconciled   DVT prophylaxis: [place] heparin    Code Status: Full   Family Communication: son at bedside   Disposition Plan: TBD   Consults called: orthopedics Dr. Phyllis Breeze   Admission status: INP  Level of care: Med-Surg Faustino Hook MD [facility] How to contact the [facility] Attending or Consulting provider 7A - 7P or covering provider during after hours 7P -7A, for this patient?  Check the care team in [facility] and look for a) attending/consulting [facility] provider listed and b) the [facility] team listed Log into [email or web address] and use [place]'s universal password to access. If you do not have the password, please contact the hospital operator. Locate the [facility] provider you are looking for under [facility] and page to a number that you can be directly reached. If you still have difficulty reaching the provider, please page the [facility] (Director on Call) for the Hospitalists listed on amion for assistance.   If 7PM-7AM, please contact  night-coverage [email or web address] Password [facility]  04/13/2024, 7:21 AM

## 2024-04-13 NOTE — Anesthesia Procedure Notes (Signed)
 Spinal  Patient location during procedure: OR Start time: 04/13/2024 3:15 PM End time: 04/13/2024 3:17 PM Reason for block: surgical anesthesia Staffing Performed: anesthesiologist  Anesthesiologist: Coretha Dew, MD Performed by: Coretha Dew, MD Authorized by: Coretha Dew, MD   Preanesthetic Checklist Completed: patient identified, IV checked, site marked, risks and benefits discussed, surgical consent, monitors and equipment checked, pre-op evaluation and timeout performed Spinal Block Patient position: left lateral decubitus Prep: Betadine  Patient monitoring: heart rate, cardiac monitor, continuous pulse ox and blood pressure Approach: midline Location: L4-5 Injection technique: single-shot Needle Needle type: Pencan  Needle gauge: 22 G

## 2024-04-13 NOTE — Hospital Course (Signed)
 73 year old female with history of anemia, GAD, depression, aortic atherosclerosis, GERD, hyperlipidemia, tobacco, hypertension, orthostatic hypotension, osteoporosis, status post right hip fracture from a mechanical fall in February 2024 and underwent a right hip hemiarthroplasty by Dr. Ernesta Heading.  She presented to ED by EMS from home after an unwitnessed mechanical fall.  Patient denies hitting head and not sure about having loss of consciousness.  She complained of left hip pain and headache.  She says she was getting something out of the refrigerator when she lost her balance and fell.  She reports she takes low-dose aspirin  daily.  She had a CT scan of her head and cervical spine that showed no acute intracranial injury and no cervical spine injury.  Hip x-ray show left femoral neck fracture.  Dr. Phyllis Breeze with orthopedic service was consulted and recommended admission for surgical management.

## 2024-04-13 NOTE — Interval H&P Note (Signed)
 History and Physical Interval Note:  04/13/2024 2:59 PM  Jody Wilson  has presented today for surgery, with the diagnosis of LEFT FEMORAL NECK FRACTURE.  The various methods of treatment have been discussed with the patient and family. After consideration of risks, benefits and other options for treatment, the patient has consented to  Procedure(s): HEMIARTHROPLASTY (BIPOLAR) HIP, DIRECT LATERAL  FOR FRACTURE (Left) as a surgical intervention.  The patient's history has been reviewed, patient examined, no change in status, stable for surgery.  I have reviewed the patient's chart and labs.  Questions were answered to the patient's satisfaction.     Elsa Halls

## 2024-04-13 NOTE — Brief Op Note (Signed)
 04/13/2024  4:56 PM  PATIENT:  Lexandra Boeck  73 y.o. female  PRE-OPERATIVE DIAGNOSIS:  LEFT FEMORAL NECK FRACTURE  POST-OPERATIVE DIAGNOSIS:  LEFT FEMORAL NECK FRACTURE  PROCEDURE:  Procedure(s): HEMIARTHROPLASTY (BIPOLAR) HIP, DIRECT LATERAL  FOR FRACTURE (Left)  SURGEON:  Surgeons and Role:    * Darrin Emerald, MD - Primary  PHYSICIAN ASSISTANT:   ASSISTANTS: Augustus Blood  ANESTHESIA:   spinal  EBL:  100 mL   BLOOD ADMINISTERED:none  DRAINS: none   LOCAL MEDICATIONS USED:  MARCAINE      SPECIMEN:  No Specimen  DISPOSITION OF SPECIMEN:  N/A  COUNTS: Yes  TOURNIQUET:  * No tourniquets in log *  DICTATION: .Dragon Dictation  PLAN OF CARE: Admit to inpatient   PATIENT DISPOSITION:  PACU - hemodynamically stable.   Delay start of Pharmacological VTE agent (>24hrs) due to surgical blood loss or risk of bleeding: yes

## 2024-04-13 NOTE — Op Note (Signed)
 04/13/2024  4:56 PM  PATIENT:  Jody Wilson  73 y.o. female  PRE-OPERATIVE DIAGNOSIS:  LEFT FEMORAL NECK FRACTURE  POST-OPERATIVE DIAGNOSIS:  LEFT FEMORAL NECK FRACTURE  PROCEDURE:  Procedure(s): HEMIARTHROPLASTY (BIPOLAR) HIP, DIRECT LATERAL  FOR FRACTURE (Left)  SURGEON:  Surgeons and Role:    * Darrin Emerald, MD - Primary  PHYSICIAN ASSISTANT:   ASSISTANTS: Augustus Blood  ANESTHESIA:   spinal  EBL:  100 mL   BLOOD ADMINISTERED:none  DRAINS: none   LOCAL MEDICATIONS USED:  MARCAINE      SPECIMEN:  No Specimen  DISPOSITION OF SPECIMEN:  N/A  COUNTS: Yes  TOURNIQUET:  * No tourniquets in log *  DICTATION: .Dragon Dictation  PLAN OF CARE: Admit to inpatient   PATIENT DISPOSITION:  PACU - hemodynamically stable.   Delay start of Pharmacological VTE agent (>24hrs) due to surgical blood loss or risk of bleeding: yes   Orthopaedic Surgery Operative Note (CSN: [id number])  Jody Wilson  12/22/1950 Date of Surgery: 04/13/2024     TXA was used Operative Finding Complete left femoral neck fracture.  No evidence of chondromalacia of the head or acetabulum    Implants: Implant Name Type Inv. Item Serial No. Manufacturer Lot No. LRB No. Used Action  HEAD BIPOLAR DEPUY 47 - [id number] Hips HEAD BIPOLAR DEPUY 47  [employer]PU[employer] [id number] Left 1 Implanted  STEM FEMORAL SZ STD ACTIS - [id number] Stem STEM FEMORAL SZ STD ACTIS  [employer]PU[employer] ORTH[employer] [id number] Left 1 Implanted  HEAD FEM STD 28X+8.5 STRL - [id number] Hips HEAD FEM STD 28X+8.5 STRL  [employer]PU[employer] [id number] Left 1 Implanted    Indication for surgery left femoral neck fracture hip fracture   Details of surgery: The patient was identified by 2 approved identification mechanisms. The operative extremity was evaluated and found to be acceptable for surgical treatment today. The chart was reviewed. The surgical site was confirmed initials were placed over the left hip  In the  operating room the patient had 2 g of Ancef  Followed by administering spinal anesthetic Foley catheter insertion was completed  The patient was then placed in the lateral decubitus position with appropriate padding. The surgical site was prepped and draped sterilely.  Timeout was executed confirming the patient's name, surgical site, antibiotic administration, x-rays available, and implants were checked and were available.  Incision was made over the greater trochanter extended proximally and distally approximately 3 cm  Subcutaneous tissue was divided down to fascia which was then split in line with the skin incision and deep retractors were placed.  The greater trochanteric bursa was resected exposing the abductors   The gluteus medius anterior half was subperiosteally dissected from the greater trochanter and tagged with #1 Vicryl sutures  The underlying gluteus minimus was split in continuity with the capsule and preserved tagging with Vicryl sutures as well 2 Steinmann pins were then placed in the pelvis to retract the soft tissue.  The hip capsule and gluteus minimus were preserved split then tagged  The femoral head was removed and measured 47  The acetabular was inspected: I did not see any arthritis  The hip was dislocated anteriorly into a sterile bag  Proximal femur was prepared starting with a femoral neck cutting guide, box osteotome, and further preparation per manufacture technique  Broaching was started with a size 1 and broached up to appropriate size based on proximal fit and fill.  This was a size 6  Trial reduction was then performed using a +5  47 head with a size 6 stem  Although the reduction was good and relatively stable it seemed a little loose on the shuck test so we placed a +8.5 neck and this gave better stability    The trial implants were then removed 2 drill holes were placed in the greater trochanter and a #5 Ethibond was passed through the drill  holes   The acetabulum was irrigated and cleaned of any bony debris, the  implants were placed and the hip was reduced  The hip was stable throughout the range of motion.  As follows  Hip flexion  Hip extension with external rotation  Sleep position stress test  Shuck test  Leg lengths equal  Local anesthetic Marcaine  with epinephrine  was injected in the soft tissues including the subcontinues tissue, the abductors were repaired using the #5 Ethibond and then oversewn with #1 Braylon  The hip was then abducted the fascia was closed with #1 Braylon  Subcutaneous tissues were closed with 0 Monocryl and 0 Monocryl and  staples  Additional Marcaine  was injected in the subfascial layer  A sterile dressing was applied  The patient was taken recovery room in stable condition   Postop plan  Weightbearing as tolerated Direct lateral hip precautions DVT prophylaxis for 30 days Remove staples at 12 to 14 days Postop appointment scheduled for 28 days  [id number]

## 2024-04-13 NOTE — ED Notes (Signed)
 ED Provider at bedside.

## 2024-04-13 NOTE — Consult Note (Signed)
 Reason for Consult:pain left hip  Referring Physician: Dr Faustino Hook  Jody Wilson is an 73 y.o. female.  HPI: 69 F dizzy after getting and orange from frig. Fell backwards fractured left hip  C/o mod pain left hip non rad Inj date : 04/13/24  Past Medical History:  Diagnosis Date   Anemia    Anxiety    Aortic atherosclerosis (HCC)    Carotid artery disease (HCC)    Depression    GERD (gastroesophageal reflux disease)    Hyperlipidemia    Hypertension    Orthostatic hypotension    Osteoporosis     Past Surgical History:  Procedure Laterality Date   ABDOMINAL HYSTERECTOMY     BACK SURGERY     CHOLECYSTECTOMY     FOOT SURGERY     7 in the past   HIP ARTHROPLASTY Right 02/11/2023   Procedure: ARTHROPLASTY BIPOLAR HIP (HEMIARTHROPLASTY);  Surgeon: Tonita Frater, MD;  Location: [facility];  Service: Orthopedics;  Laterality: Right;   IR GENERIC HISTORICAL  12/09/2016   IR RADIOLOGIST EVAL & MGMT 12/09/2016 [facility]   IR GENERIC HISTORICAL  12/10/2016   IR VERTEBROPLASTY CERV/THOR BX INC UNI/BIL INC/INJECT/IMAGING 12/10/2016 [facility]   LEFT HEART CATH AND CORONARY ANGIOGRAPHY N/A 09/30/2020   Procedure: LEFT HEART CATH AND CORONARY ANGIOGRAPHY;  Surgeon: Cody Das, MD;  Location: [facility];  Service: Cardiovascular;  Laterality: N/A;   TRACHEOSTOMY TUBE PLACEMENT  1982    Family History  Problem Relation Age of Onset   Heart disease Mother    Hypertension Mother    Hyperlipidemia Mother    Diabetes Mother    Congestive Heart Failure Mother    Aneurysm Paternal Uncle    Heart attack Maternal Grandmother    Heart attack Maternal Grandfather    Hypertension Son    Other Son 9       Kienbock's Disease   Liver disease Son 79       Gilbert's Disease   Lupus Half-Sister    Atrial fibrillation Half-Sister    Breast cancer Niece     Social History:  reports that she quit smoking about 13 months ago. Her smoking use included cigarettes.  She started smoking about 26 years ago. She has a 6.3 pack-year smoking history. She has never been exposed to tobacco smoke. She has never used smokeless tobacco. She reports that she does not currently use alcohol. She reports that she does not use drugs.  Allergies:  Allergies  Allergen Reactions   Tetracyclines & Related Itching and Swelling    Medications: I have reviewed the patient's current medications.  Results for orders placed or performed during the hospital encounter of 04/13/24 (from the past 48 hours)  Basic metabolic panel     Status: Abnormal   Collection Time: 04/13/24  6:04 AM  Result Value Ref Range   Sodium 136 135 - 145 mmol/L   Potassium 3.3 (L) 3.5 - 5.1 mmol/L   Chloride 101 98 - 111 mmol/L   CO2 21 (L) 22 - 32 mmol/L   Glucose, Bld 92 70 - 99 mg/dL    Comment: Glucose reference range applies only to samples taken after fasting for at least 8 hours.   BUN 18 8 - 23 mg/dL   Creatinine, Ser 1.61 0.44 - 1.00 mg/dL   Calcium  9.2 8.9 - 10.3 mg/dL   GFR, Estimated >09 >60 mL/min    Comment: (NOTE) Calculated using the CKD-EPI Creatinine Equation (2021)  Anion gap 14 5 - 15    Comment: Performed at [facility], [address], [place], [place] [address]  CBC with Differential     Status: Abnormal   Collection Time: 04/13/24  6:04 AM  Result Value Ref Range   WBC 9.6 4.0 - 10.5 K/uL   RBC 4.07 3.87 - 5.11 MIL/uL   Hemoglobin 14.1 12.0 - 15.0 g/dL   HCT 96.2 95.2 - 84.1 %   MCV 99.5 80.0 - 100.0 fL   MCH 34.6 (H) 26.0 - 34.0 pg   MCHC 34.8 30.0 - 36.0 g/dL   RDW 32.4 40.1 - 02.7 %   Platelets 210 150 - 400 K/uL   nRBC 0.0 0.0 - 0.2 %   Neutrophils Relative % 79 %   Neutro Abs 7.5 1.7 - 7.7 K/uL   Lymphocytes Relative 15 %   Lymphs Abs 1.5 0.7 - 4.0 K/uL   Monocytes Relative 5 %   Monocytes Absolute 0.5 0.1 - 1.0 K/uL   Eosinophils Relative 0 %   Eosinophils Absolute 0.0 0.0 - 0.5 K/uL   Basophils Relative 0 %   Basophils Absolute 0.0 0.0 - 0.1 K/uL    Immature Granulocytes 1 %   Abs Immature Granulocytes 0.05 0.00 - 0.07 K/uL    Comment: Performed at [facility], [address], [place], [place] [address]  Protime-INR     Status: None   Collection Time: 04/13/24  6:04 AM  Result Value Ref Range   Prothrombin Time 14.0 11.4 - 15.2 seconds   INR 1.1 0.8 - 1.2    Comment: (NOTE) INR goal varies based on device and disease states. Performed at [facility], [address] [place], [place] [address]   Type and screen [facility]     Status: None   Collection Time: 04/13/24  6:04 AM  Result Value Ref Range   ABO/RH(D) A POS    Antibody Screen NEG    Sample Expiration      04/16/2024,2359 Performed at [facility], [address] [place], [place] [address]   Magnesium      Status: Abnormal   Collection Time: 04/13/24  7:44 AM  Result Value Ref Range   Magnesium  1.4 (L) 1.7 - 2.4 mg/dL    Comment: Performed at [facility], [address] [place], [place] [address]  VITAMIN D  25 Hydroxy (Vit-D Deficiency, Fractures)     Status: Abnormal   Collection Time: 04/13/24  7:44 AM  Result Value Ref Range   Vit D, 25-Hydroxy 100.18 (H) 30 - 100 ng/mL    Comment: (NOTE) Vitamin D  deficiency has been defined by the Institute of Medicine  and an Endocrine Society practice guideline as a level of serum 25-OH  vitamin D  less than 20 ng/mL (1,2). The Endocrine Society went on to  further define vitamin D  insufficiency as a level between 21 and 29  ng/mL (2).  1. IOM (Institute of Medicine). 2010. Dietary reference intakes for  calcium  and D. [place]: The [employer]. 2. Holick MF, Binkley [place], Bischoff-Ferrari HA, et al. Evaluation,  treatment, and prevention of vitamin D  deficiency: an Endocrine  Society clinical practice guideline, JCEM. 2011 Jul; 96(7): 1911-30.  Performed at [facility], [address] [place], [place] [address]   Surgical PCR screen     Status: None   Collection Time: 04/13/24 10:17  AM   Specimen: Nasal Mucosa; Nasal Swab  Result Value Ref Range   MRSA, PCR NEGATIVE NEGATIVE  Staphylococcus aureus NEGATIVE NEGATIVE    Comment: (NOTE) The Xpert SA Assay (FDA approved for NASAL specimens in patients 66 years of age and older), is one component of a comprehensive surveillance program. It is not intended to diagnose infection nor to guide or monitor treatment. Performed at [facility], [address] [place], [place] [address]     DG HIP UNILAT WITH PELVIS 2-3 VIEWS LEFT Result Date: 04/13/2024 CLINICAL DATA:  Fall EXAM: DG HIP (WITH OR WITHOUT PELVIS) 3V LEFT COMPARISON:  None Available. FINDINGS: Left femoral neck fracture with lateral displacement and medial impaction. Bipolar right hip hemiarthroplasty which is unremarkable where covered. No evidence of pelvic ring fracture. Lower lumbar degeneration. IMPRESSION: Impacted left femoral neck fracture. Electronically Signed   By: Ronnette Coke M.D.   On: 04/13/2024 07:28   DG Chest 1 View Result Date: 04/13/2024 CLINICAL DATA:  Preop respiratory exam.  Left hip fracture. EXAM: CHEST  1 VIEW COMPARISON:  09/28/2023 FINDINGS: Stable cardiomediastinal contours. Large hiatal hernia. No pleural effusion, airspace consolidation, atelectasis or pneumothorax. Remote healed right clavicle fracture. Signs of previous vertebroplasty within the lower thoracic spine. IMPRESSION: 1. No acute cardiopulmonary abnormalities. 2. Large hiatal hernia. Electronically Signed   By: Kimberley Penman M.D.   On: 04/13/2024 07:26   CT Head Wo Contrast Result Date: 04/13/2024 CLINICAL DATA:  Neck trauma.  Unwitnessed fall with head trauma. EXAM: CT HEAD WITHOUT CONTRAST CT CERVICAL SPINE WITHOUT CONTRAST TECHNIQUE: Multidetector CT imaging of the head and cervical spine was performed following the standard protocol without intravenous contrast. Multiplanar CT image reconstructions of the cervical spine were also generated. RADIATION DOSE REDUCTION:  This exam was performed according to the departmental dose-optimization program which includes automated exposure control, adjustment of the mA and/or kV according to patient size and/or use of iterative reconstruction technique. COMPARISON:  09/28/2023 FINDINGS: CT HEAD FINDINGS Brain: No evidence of acute infarction, hemorrhage, hydrocephalus, extra-axial collection or mass lesion/mass effect. Vascular: No hyperdense vessel or unexpected calcification. Skull: Normal. Negative for fracture or focal lesion. Sinuses/Orbits: Chronic left sphenoid sinusitis with polypoid density and sclerotic wall thickening. CT CERVICAL SPINE FINDINGS Alignment: No traumatic malalignment Skull base and vertebrae: No acute fracture. No primary bone lesion or focal pathologic process. Soft tissues and spinal canal: No prevertebral fluid or swelling. No visible canal hematoma. Disc levels:  C5-6 and C6-7 ACDF with solid arthrodesis. Upper chest: No evidence of acute injury. Old injury at the right coracoclavicular ligament with calcification. IMPRESSION: No evidence of acute intracranial or cervical spine injury. Electronically Signed   By: Ronnette Coke M.D.   On: 04/13/2024 06:38   CT Cervical Spine Wo Contrast Result Date: 04/13/2024 CLINICAL DATA:  Neck trauma.  Unwitnessed fall with head trauma. EXAM: CT HEAD WITHOUT CONTRAST CT CERVICAL SPINE WITHOUT CONTRAST TECHNIQUE: Multidetector CT imaging of the head and cervical spine was performed following the standard protocol without intravenous contrast. Multiplanar CT image reconstructions of the cervical spine were also generated. RADIATION DOSE REDUCTION: This exam was performed according to the departmental dose-optimization program which includes automated exposure control, adjustment of the mA and/or kV according to patient size and/or use of iterative reconstruction technique. COMPARISON:  09/28/2023 FINDINGS: CT HEAD FINDINGS Brain: No evidence of acute infarction,  hemorrhage, hydrocephalus, extra-axial collection or mass lesion/mass effect. Vascular: No hyperdense vessel or unexpected calcification. Skull: Normal. Negative for fracture or focal lesion. Sinuses/Orbits: Chronic left sphenoid sinusitis with polypoid density and sclerotic wall thickening. CT CERVICAL SPINE FINDINGS Alignment: No traumatic  malalignment Skull base and vertebrae: No acute fracture. No primary bone lesion or focal pathologic process. Soft tissues and spinal canal: No prevertebral fluid or swelling. No visible canal hematoma. Disc levels:  C5-6 and C6-7 ACDF with solid arthrodesis. Upper chest: No evidence of acute injury. Old injury at the right coracoclavicular ligament with calcification. IMPRESSION: No evidence of acute intracranial or cervical spine injury. Electronically Signed   By: Ronnette Coke M.D.   On: 04/13/2024 06:38    Review of Systems  Constitutional:  Negative for chills and fever.  Respiratory:  Negative for shortness of breath.   Cardiovascular:  Negative for chest pain.  All other systems reviewed and are negative.  Blood pressure 114/77, pulse 78, temperature 98.3 F (36.8 C), temperature source Oral, resp. rate 15, height 5\' 5"  (1.651 m), weight 64.4 kg, SpO2 97%. Physical Exam Vitals and nursing note reviewed.  Constitutional:      General: She is not in acute distress.    Appearance: Normal appearance. She is normal weight. She is not ill-appearing, toxic-appearing or diaphoretic.  HENT:     Head: Normocephalic and atraumatic.     Right Ear: External ear normal.     Left Ear: External ear normal.     Nose: Nose normal. No congestion or rhinorrhea.     Mouth/Throat:     Mouth: Mucous membranes are moist.     Pharynx: No oropharyngeal exudate or posterior oropharyngeal erythema.  Eyes:     General: No scleral icterus.       Right eye: No discharge.        Left eye: No discharge.     Extraocular Movements: Extraocular movements intact.      Conjunctiva/sclera: Conjunctivae normal.     Pupils: Pupils are equal, round, and reactive to light.  Cardiovascular:     Rate and Rhythm: Normal rate and regular rhythm.     Heart sounds: Normal heart sounds.  Pulmonary:     Effort: Pulmonary effort is normal. No respiratory distress.     Breath sounds: Normal breath sounds. No stridor.  Chest:     Chest wall: No tenderness.  Abdominal:     General: Abdomen is flat. Bowel sounds are normal. There is no distension.     Palpations: Abdomen is soft. There is no mass.     Tenderness: There is no abdominal tenderness.  Musculoskeletal:     Cervical back: Normal range of motion and neck supple. No rigidity or tenderness.     Comments: Left hip  Skin normal  Tender yes prox femur   Rom none   Stable knee and ankle   Motor normal   UEs normal rom stability strength and align  Rt low ext normal skin rom stab strength and alignment   Lymphadenopathy:     Cervical: No cervical adenopathy.  Skin:    General: Skin is warm and dry.     Capillary Refill: Capillary refill takes less than 2 seconds.  Neurological:     General: No focal deficit present.     Mental Status: She is alert and oriented to person, place, and time.     Cranial Nerves: No cranial nerve deficit.     Sensory: No sensory deficit.     Motor: No weakness.     Gait: Gait abnormal.     Deep Tendon Reflexes: Reflexes normal.  Psychiatric:        Mood and Affect: Mood normal.        Behavior:  Behavior normal.        Thought Content: Thought content normal.        Judgment: Judgment normal.     Assessment/Plan: Left hip frx   Left hip bipolar   The procedure has been fully reviewed with the patient; The risks and benefits of surgery have been discussed and explained and understood. Alternative treatment has also been reviewed, questions were encouraged and answered. The postoperative plan is also been reviewed.   Elsa Halls 04/13/2024, 2:59 PM

## 2024-04-13 NOTE — TOC Initial Note (Signed)
 Transition of Care [facility]) - Initial/Assessment Note    Patient Details  Name: Jody Wilson MRN: [id number] Date of Birth: 1951/11/25  [facility] CM/SW Contact:    Cyndie Dredge, LCSWA Phone Number: 04/13/2024, 9:03 AM  Clinical Narrative:                  CSW spoke with patient at bedside and assessed her due to consult for SNF. Son was also at bedside. Patient expressed that she had a bad experience when she went to [facility] for about 2 weeks and rather go home with [facility] or OPPT . Patient has a walker, shower chair, and rollator for equipment at home. Patient spouse and son who lives next door helps her, but patient and son states that patient is fairly independent. Patient states that she still drives. TOC to follow.   Expected Discharge Plan: Home w Home Health Services Barriers to Discharge: Continued Medical Work up   Patient Goals and CMS Choice Patient states their goals for this hospitalization and ongoing recovery are:: return back home CMS Medicare.gov Compare Post Acute Care list provided to:: Patient Choice offered to / list presented to : Patient      Expected Discharge Plan and Services In-house Referral: Clinical Social Work Discharge Planning Services: CM Consult Post Acute Care Choice: Durable Medical Equipment Living arrangements for the past 2 months: Single Family Home                 DME Arranged: N/A DME Agency: NA       HH Arranged: Refused SNF          Prior Living Arrangements/Services Living arrangements for the past 2 months: Single Family Home Lives with:: Spouse Patient language and need for interpreter reviewed:: Yes Do you feel safe going back to the place where you live?: Yes      Need for Family Participation in Patient Care: Yes (Comment) Care giver support system in place?: No (comment) Current home services: DME Criminal Activity/Legal Involvement Pertinent to Current Situation/Hospitalization: No - Comment as  needed  Activities of Daily Living      Permission Sought/Granted      Share Information with NAME: Daysi     Permission granted to share info w Relationship: Patient     Emotional Assessment Appearance:: Appears stated age   Affect (typically observed): Accepting, Appropriate Orientation: : Oriented to Self, Oriented to Place, Oriented to  Time, Oriented to Situation Alcohol / Substance Use: Not Applicable Psych Involvement: No (comment)  Admission diagnosis:  Closed left hip fracture (HCC) [S72.002A] Patient Active Problem List   Diagnosis Date Noted   Closed left hip fracture (HCC) 04/13/2024   Hypokalemia 04/13/2024   Closed right hip fracture (HCC) 02/09/2023   Pulmonary nodule 02/09/2023   Hypertriglyceridemia 01/22/2022   Abnormal findings on diagnostic imaging of liver and biliary tract 12/09/2021   Diarrhea 12/09/2021   History of colonic polyps 12/09/2021   Anxiety and depression 06/23/2020   Hiatal hernia 06/23/2020   Thoracic aortic atherosclerosis (HCC) 06/23/2020   Tobacco abuse 04/25/2020   Involuntary muscle jerks while sleeping 09/26/2019   B12 deficiency 09/25/2019   Iron deficiency anemia 09/25/2019   Elevated liver function tests 09/07/2019   Osteoporosis 02/09/2017   History of vertebral fracture 02/09/2017   GERD (gastroesophageal reflux disease) 03/11/2016   Hyperlipidemia with target LDL less than 100 02/19/2015   Hypertension 03/18/2014   PCP:  Dettinger, Lucio Sabin, MD Pharmacy:  [facility] [place], [place] - [address] [place] [address] Phone: [phone] Fax: [phone]     Social Drivers of Health (SDOH) Social History: SDOH Screenings   Food Insecurity: No Food Insecurity (12/27/2023)  Housing: Unknown (12/27/2023)  Transportation Needs: No Transportation Needs (12/27/2023)  Utilities: Not At Risk (12/27/2023)  Alcohol Screen: Low Risk  (12/27/2023)  Depression (PHQ2-9): Medium Risk  (12/27/2023)  Financial Resource Strain: Low Risk  (12/27/2023)  Physical Activity: Inactive (12/27/2023)  Social Connections: Socially Integrated (12/27/2023)  Stress: No Stress Concern Present (12/27/2023)  Tobacco Use: Medium Risk (04/13/2024)  Health Literacy: Adequate Health Literacy (12/27/2023)   SDOH Interventions:     Readmission Risk Interventions    04/13/2024    9:01 AM  Readmission Risk Prevention Plan  Medication Screening Complete  Transportation Screening Complete

## 2024-04-13 NOTE — Transfer of Care (Signed)
 Immediate Anesthesia Transfer of Care Note  Patient: Jody Wilson  Procedure(s) Performed: HEMIARTHROPLASTY (BIPOLAR) HIP, DIRECT LATERAL  FOR FRACTURE (Left: Hip)  Patient Location: PACU  Anesthesia Type:Spinal  Level of Consciousness: awake and alert   Airway & Oxygen Therapy: Patient Spontanous Breathing  Post-op Assessment: Report given to RN and Post -op Vital signs reviewed and unstable, Anesthesiologist notified  Post vital signs: Reviewed and stable  Last Vitals:  Vitals Value Taken Time  BP 72/41 04/13/24 1702  Temp 36.4 C 04/13/24 1700  Pulse 71 04/13/24 1713  Resp 14 04/13/24 1713  SpO2 94 % 04/13/24 1713  Vitals shown include unfiled device data.\   Last Pain:  Vitals:   04/13/24 1700  TempSrc:   PainSc: 0-No pain      Patients Stated Pain Goal: 4 (04/13/24 1407)  Complications: No notable events documented.  Giving fluid bolus for BP

## 2024-04-13 NOTE — ED Triage Notes (Signed)
 Pt arrives via EMS from home after unwitnessed mechanical fall. Pt states unknown LOC or hitting head. C/o L hip pain and HA. L leg appears shortened and rotated, pedal pulses intact. C-Collar in place by EMS.

## 2024-04-14 DIAGNOSIS — S72002D Fracture of unspecified part of neck of left femur, subsequent encounter for closed fracture with routine healing: Secondary | ICD-10-CM

## 2024-04-14 DIAGNOSIS — K219 Gastro-esophageal reflux disease without esophagitis: Secondary | ICD-10-CM | POA: Diagnosis not present

## 2024-04-14 DIAGNOSIS — E876 Hypokalemia: Secondary | ICD-10-CM | POA: Diagnosis not present

## 2024-04-14 DIAGNOSIS — E785 Hyperlipidemia, unspecified: Secondary | ICD-10-CM | POA: Diagnosis not present

## 2024-04-14 LAB — CBC
HCT: 35.1 % — ABNORMAL LOW (ref 36.0–46.0)
Hemoglobin: 11.6 g/dL — ABNORMAL LOW (ref 12.0–15.0)
MCH: 34.4 pg — ABNORMAL HIGH (ref 26.0–34.0)
MCHC: 33 g/dL (ref 30.0–36.0)
MCV: 104.2 fL — ABNORMAL HIGH (ref 80.0–100.0)
Platelets: 153 10*3/uL (ref 150–400)
RBC: 3.37 MIL/uL — ABNORMAL LOW (ref 3.87–5.11)
RDW: 12.7 % (ref 11.5–15.5)
WBC: 6.4 10*3/uL (ref 4.0–10.5)
nRBC: 0 % (ref 0.0–0.2)

## 2024-04-14 LAB — BASIC METABOLIC PANEL WITH GFR
Anion gap: 10 (ref 5–15)
BUN: 20 mg/dL (ref 8–23)
CO2: 26 mmol/L (ref 22–32)
Calcium: 8.2 mg/dL — ABNORMAL LOW (ref 8.9–10.3)
Chloride: 100 mmol/L (ref 98–111)
Creatinine, Ser: 1.06 mg/dL — ABNORMAL HIGH (ref 0.44–1.00)
GFR, Estimated: 56 mL/min — ABNORMAL LOW (ref >60)
Glucose, Bld: 122 mg/dL — ABNORMAL HIGH (ref 70–99)
Potassium: 3.3 mmol/L — ABNORMAL LOW (ref 3.5–5.1)
Sodium: 136 mmol/L (ref 135–145)

## 2024-04-14 LAB — MAGNESIUM: Magnesium: 2.1 mg/dL (ref 1.7–2.4)

## 2024-04-14 MED ORDER — POTASSIUM CHLORIDE CRYS ER 20 MEQ PO TBCR
40.0000 meq | EXTENDED_RELEASE_TABLET | Freq: Once | ORAL | Status: AC
  Administered 2024-04-14: 40 meq via ORAL
  Filled 2024-04-14: qty 2

## 2024-04-14 MED ORDER — POTASSIUM CHLORIDE CRYS ER 20 MEQ PO TBCR: 30.0000 meq | EXTENDED_RELEASE_TABLET | Freq: Once | ORAL | Status: DC

## 2024-04-14 MED ORDER — PANTOPRAZOLE SODIUM 40 MG PO TBEC
40.0000 mg | DELAYED_RELEASE_TABLET | Freq: Two times a day (BID) | ORAL | Status: DC
  Administered 2024-04-14 – 2024-04-16 (×5): 40 mg via ORAL
  Filled 2024-04-14 (×6): qty 1

## 2024-04-14 NOTE — Plan of Care (Signed)
   Problem: Activity: Goal: Risk for activity intolerance will decrease Outcome: Progressing   Problem: Coping: Goal: Level of anxiety will decrease Outcome: Progressing

## 2024-04-14 NOTE — Progress Notes (Signed)
 PROGRESS NOTE   Jody Wilson  ZOX:[id number] DOB: 20-Sep-1951 DOA: 04/13/2024 PCP: Dettinger, Lucio Sabin, MD   Chief Complaint  Patient presents with   Fall   Level of care: Med-Surg  Brief Admission History:  73 year old female with history of anemia, GAD, depression, aortic atherosclerosis, GERD, hyperlipidemia, tobacco, hypertension, orthostatic hypotension, osteoporosis, status post right hip fracture from a mechanical fall in February 2024 and underwent a right hip hemiarthroplasty by Dr. Ernesta Heading.  She presented to ED by EMS from home after an unwitnessed mechanical fall.  Patient denies hitting head and not sure about having loss of consciousness.  She complained of left hip pain and headache.  She says she was getting something out of the refrigerator when she lost her balance and fell.  She reports she takes low-dose aspirin  daily.  She had a CT scan of her head and cervical spine that showed no acute intracranial injury and no cervical spine injury.  Hip x-ray show left femoral neck fracture.  Dr. Phyllis Breeze with orthopedic service was consulted and recommended admission for surgical management.   Assessment and Plan:  Acute left hip femoral neck fracture s/p mechanical fall Postop s/p ORIF bipolar hip hemiarthroplasty, direct lateral, left on 04/13/24 - continue surgical postop orders as written  - up with PT today for ambulation, recommendation was for SNF  - consultation with orthopedics Dr. Phyllis Breeze - hip fracture orderset bundle initiated - pain and nausea medication - vitamin D  repleted (100.18) - postop plan per Dr. Phyllis Breeze:  Weightbearing as tolerated Direct lateral hip precautions DVT prophylaxis for 30 days Remove staples at 12 to 14 days Postop appointment scheduled for 28 days   Hypokalemia - Mg was repleted  - give additional oral potassium today - recheck BMP in AM    Dyslipidemia - resumed home pravastatin  and ezetimibe    GERD - pantoprazole  ordered     Essential hypertension  - resumed home hydrochlorothiazide    DVT prophylaxis: [place] heparin   Code Status: Full  Family Communication: son at bedside  Disposition: TBD, currently afraid to go to SNF but was recommended by PT    Consultants:  Orthopedics   Procedures:  ORIF left hip 04/13/24  Antimicrobials:    Subjective: Pt reports she is sore but she has been able to start ambulating.  She is tolerating diet.  She has no specific complaints.  She says that she had a bad experience at [facility] and not interested in SNF placement.   Objective: Vitals:   04/13/24 1756 04/13/24 2200 04/14/24 0047 04/14/24 0544  BP: 126/67 97/74 105/71 115/62  Pulse: 79 72 75 65  Resp: 16 18 17 18   Temp:  98 F (36.7 C) 98.3 F (36.8 C) 97.6 F (36.4 C)  TempSrc: Oral Oral Oral Oral  SpO2: 95% 92% 95% 93%  Weight:      Height:        Intake/Output Summary (Last 24 hours) at 04/14/2024 1228 Last data filed at 04/14/2024 8119 Gross per 24 hour  Intake 1956.84 ml  Output 700 ml  Net 1256.84 ml   Filed Weights   04/13/24 0524 04/13/24 1346  Weight: 64.4 kg 64.4 kg   Examination:  General exam: Appears calm and comfortable  Respiratory system: Clear to auscultation. Respiratory effort normal. Cardiovascular system: normal S1 & S2 heard. No JVD, murmurs, rubs, gallops or clicks. No pedal edema. Gastrointestinal system: Abdomen is nondistended, soft and nontender. No organomegaly or masses felt. Normal bowel sounds heard. Central nervous system: Alert  and oriented. No focal neurological deficits. Extremities: bandages clean and dry and intact, warm and dry lower extremities with palpable DP pulses bilateral.  Skin: No rashes, lesions or ulcers. Psychiatry: Judgement and insight appear normal. Mood & affect appropriate.   Data Reviewed: I have personally reviewed following labs and imaging studies  CBC: Recent Labs  Lab 04/13/24 0604 04/14/24 0426  WBC 9.6 6.4  NEUTROABS 7.5  --    HGB 14.1 11.6*  HCT 40.5 35.1*  MCV 99.5 104.2*  PLT 210 153    Basic Metabolic Panel: Recent Labs  Lab 04/13/24 0604 04/13/24 0744 04/14/24 0426  NA 136  --  136  K 3.3*  --  3.3*  CL 101  --  100  CO2 21*  --  26  GLUCOSE 92  --  122*  BUN 18  --  20  CREATININE 0.86  --  1.06*  CALCIUM  9.2  --  8.2*  MG  --  1.4* 2.1    CBG: No results for input(s): "GLUCAP" in the last 168 hours.  Recent Results (from the past 240 hours)  Surgical PCR screen     Status: None   Collection Time: 04/13/24 10:17 AM   Specimen: Nasal Mucosa; Nasal Swab  Result Value Ref Range Status   MRSA, PCR NEGATIVE NEGATIVE Final   Staphylococcus aureus NEGATIVE NEGATIVE Final    Comment: (NOTE) The Xpert SA Assay (FDA approved for NASAL specimens in patients 39 years of age and older), is one component of a comprehensive surveillance program. It is not intended to diagnose infection nor to guide or monitor treatment. Performed at [facility], [address] [place], [place] [address]      Radiology Studies: DG HIP UNILAT WITH PELVIS 2-3 VIEWS LEFT Result Date: 04/13/2024 CLINICAL DATA:  Status post left hip hemiarthroplasty. EXAM: DG HIP (WITH OR WITHOUT PELVIS) 2-3V LEFT COMPARISON:  Pelvis and left hip radiographs 04/13/2024 FINDINGS: There is diffuse decreased bone mineralization. Interval left hip hemiarthroplasty. Partial visualization of prior right hip hemiarthroplasty. No perihardware lucency is seen to indicate hardware failure or loosening. Expected postoperative changes of lateral left hip subcutaneous air and lateral left hip surgical skin staples. Mild bilateral sacroiliac subchondral sclerosis. IMPRESSION: Interval left hip hemiarthroplasty without evidence of hardware failure. Electronically Signed   By: Bertina Broccoli M.D.   On: 04/13/2024 18:58   DG HIP UNILAT WITH PELVIS 2-3 VIEWS LEFT Result Date: 04/13/2024 CLINICAL DATA:  Fall EXAM: DG HIP (WITH OR WITHOUT PELVIS) 3V LEFT  COMPARISON:  None Available. FINDINGS: Left femoral neck fracture with lateral displacement and medial impaction. Bipolar right hip hemiarthroplasty which is unremarkable where covered. No evidence of pelvic ring fracture. Lower lumbar degeneration. IMPRESSION: Impacted left femoral neck fracture. Electronically Signed   By: Ronnette Coke M.D.   On: 04/13/2024 07:28   DG Chest 1 View Result Date: 04/13/2024 CLINICAL DATA:  Preop respiratory exam.  Left hip fracture. EXAM: CHEST  1 VIEW COMPARISON:  09/28/2023 FINDINGS: Stable cardiomediastinal contours. Large hiatal hernia. No pleural effusion, airspace consolidation, atelectasis or pneumothorax. Remote healed right clavicle fracture. Signs of previous vertebroplasty within the lower thoracic spine. IMPRESSION: 1. No acute cardiopulmonary abnormalities. 2. Large hiatal hernia. Electronically Signed   By: Kimberley Penman M.D.   On: 04/13/2024 07:26   CT Head Wo Contrast Result Date: 04/13/2024 CLINICAL DATA:  Neck trauma.  Unwitnessed fall with head trauma. EXAM: CT HEAD WITHOUT CONTRAST CT CERVICAL SPINE WITHOUT CONTRAST TECHNIQUE: Multidetector CT imaging  of the head and cervical spine was performed following the standard protocol without intravenous contrast. Multiplanar CT image reconstructions of the cervical spine were also generated. RADIATION DOSE REDUCTION: This exam was performed according to the departmental dose-optimization program which includes automated exposure control, adjustment of the mA and/or kV according to patient size and/or use of iterative reconstruction technique. COMPARISON:  09/28/2023 FINDINGS: CT HEAD FINDINGS Brain: No evidence of acute infarction, hemorrhage, hydrocephalus, extra-axial collection or mass lesion/mass effect. Vascular: No hyperdense vessel or unexpected calcification. Skull: Normal. Negative for fracture or focal lesion. Sinuses/Orbits: Chronic left sphenoid sinusitis with polypoid density and sclerotic wall  thickening. CT CERVICAL SPINE FINDINGS Alignment: No traumatic malalignment Skull base and vertebrae: No acute fracture. No primary bone lesion or focal pathologic process. Soft tissues and spinal canal: No prevertebral fluid or swelling. No visible canal hematoma. Disc levels:  C5-6 and C6-7 ACDF with solid arthrodesis. Upper chest: No evidence of acute injury. Old injury at the right coracoclavicular ligament with calcification. IMPRESSION: No evidence of acute intracranial or cervical spine injury. Electronically Signed   By: Ronnette Coke M.D.   On: 04/13/2024 06:38   CT Cervical Spine Wo Contrast Result Date: 04/13/2024 CLINICAL DATA:  Neck trauma.  Unwitnessed fall with head trauma. EXAM: CT HEAD WITHOUT CONTRAST CT CERVICAL SPINE WITHOUT CONTRAST TECHNIQUE: Multidetector CT imaging of the head and cervical spine was performed following the standard protocol without intravenous contrast. Multiplanar CT image reconstructions of the cervical spine were also generated. RADIATION DOSE REDUCTION: This exam was performed according to the departmental dose-optimization program which includes automated exposure control, adjustment of the mA and/or kV according to patient size and/or use of iterative reconstruction technique. COMPARISON:  09/28/2023 FINDINGS: CT HEAD FINDINGS Brain: No evidence of acute infarction, hemorrhage, hydrocephalus, extra-axial collection or mass lesion/mass effect. Vascular: No hyperdense vessel or unexpected calcification. Skull: Normal. Negative for fracture or focal lesion. Sinuses/Orbits: Chronic left sphenoid sinusitis with polypoid density and sclerotic wall thickening. CT CERVICAL SPINE FINDINGS Alignment: No traumatic malalignment Skull base and vertebrae: No acute fracture. No primary bone lesion or focal pathologic process. Soft tissues and spinal canal: No prevertebral fluid or swelling. No visible canal hematoma. Disc levels:  C5-6 and C6-7 ACDF with solid arthrodesis. Upper  chest: No evidence of acute injury. Old injury at the right coracoclavicular ligament with calcification. IMPRESSION: No evidence of acute intracranial or cervical spine injury. Electronically Signed   By: Ronnette Coke M.D.   On: 04/13/2024 06:38    Scheduled Meds:  aspirin  EC  81 mg Oral Daily   buPROPion   150 mg Oral Daily   calcium -vitamin D   1 tablet Oral Q lunch   citalopram   40 mg Oral Daily   docusate sodium   100 mg Oral BID   enoxaparin (LOVENOX) injection  40 mg Subcutaneous Q24H   ezetimibe   10 mg Oral Daily   feeding supplement  237 mL Oral BID BM   hydrochlorothiazide   12.5 mg Oral Daily   irbesartan   150 mg Oral QPM   multivitamin with minerals  1 tablet Oral Daily   mupirocin  ointment  1 Application Nasal BID   naproxen  250 mg Oral BID WC   ondansetron  (ZOFRAN ) IV  4 mg Intravenous Once   pantoprazole   40 mg Oral Daily   potassium chloride   30 mEq Oral Once   pravastatin   40 mg Oral QPM   traMADol   50 mg Oral Q6H   Continuous Infusions:  sodium chloride  Stopped (04/13/24 2258)  lactated ringers        LOS: 1 day   Time spent: 53 mins  Makaylyn Sinyard Lincoln Renshaw, MD How to contact the [facility] Attending or Consulting provider 7A - 7P or covering provider during after hours 7P -7A, for this patient?  Check the care team in [facility] and look for a) attending/consulting [facility] provider listed and b) the [facility] team listed Log into [email or web address] to find provider on call.  Locate the [facility] provider you are looking for under [facility] and page to a number that you can be directly reached. If you still have difficulty reaching the provider, please page the [facility] (Director on Call) for the Hospitalists listed on amion for assistance.  04/14/2024, 12:28 PM

## 2024-04-14 NOTE — Plan of Care (Signed)
  Problem: Acute Rehab PT Goals(only PT should resolve) Goal: Pt Will Go Supine/Side To Sit 04/14/2024 1133 by Powell-Butler, Sharnay Cashion E, PT Outcome: Progressing Flowsheets (Taken 04/14/2024 1133) Pt will go Supine/Side to Sit: with supervision 04/14/2024 1132 by Powell-Butler, Artesha Wemhoff E, PT Outcome: Progressing Flowsheets (Taken 04/14/2024 1132) Pt will go Supine/Side to Sit: with contact guard assist Goal: Patient Will Transfer Sit To/From Stand 04/14/2024 1133 by Powell-Butler, Ludwig Tugwell E, PT Outcome: Progressing Flowsheets (Taken 04/14/2024 1133) Patient will transfer sit to/from stand: with supervision 04/14/2024 1132 by Powell-Butler, Aalani Aikens E, PT Outcome: Progressing Flowsheets (Taken 04/14/2024 1132) Patient will transfer sit to/from stand: with supervision Goal: Pt Will Transfer Bed To Chair/Chair To Bed 04/14/2024 1133 by Powell-Butler, Lyndzee Kliebert E, PT Outcome: Progressing Flowsheets (Taken 04/14/2024 1133) Pt will Transfer Bed to Chair/Chair to Bed: with supervision 04/14/2024 1132 by Powell-Butler, Shyvonne Chastang E, PT Outcome: Progressing Flowsheets (Taken 04/14/2024 1132) Pt will Transfer Bed to Chair/Chair to Bed: with supervision Goal: Pt Will Ambulate 04/14/2024 1133 by Powell-Butler, Camille Thau E, PT Outcome: Progressing Flowsheets (Taken 04/14/2024 1133) Pt will Ambulate:  15 feet  with supervision  with rolling walker 04/14/2024 1132 by Powell-Butler, Jeannifer Drakeford E, PT Outcome: Progressing Flowsheets (Taken 04/14/2024 1132) Pt will Ambulate:  15 feet  with contact guard assist  with rolling walker Goal: Pt Will Go Up/Down Stairs 04/14/2024 1133 by Powell-Butler, Henri Baumler E, PT Outcome: Progressing Flowsheets (Taken 04/14/2024 1133) Pt will Go Up / Down Stairs:  1-2 stairs  with rail(s)  with minimal assist 04/14/2024 1132 by Powell-Butler, My Rinke E, PT Flowsheets (Taken 04/14/2024 1132) Pt will Go Up / Down Stairs:  1-2 stairs  with minimal assist  with rail(s)   11:34 AM, 04/14/24 Marysue Sola, PT, DPT [place] with [facility] [place]

## 2024-04-14 NOTE — Evaluation (Signed)
 Physical Therapy Evaluation Patient Details Name: Jody Wilson MRN: [id number] DOB: [birth year] Today's Date: 04/14/2024  History of Present Illness  Jody Wilson is a 73 year old female with history of anemia, GAD, depression, aortic atherosclerosis, GERD, hyperlipidemia, tobacco, hypertension, orthostatic hypotension, osteoporosis, status post right hip fracture from a mechanical fall in February 2024 and underwent a right hip hemiarthroplasty by Dr. Ernesta Heading.  She presented to ED by EMS from home after an unwitnessed mechanical fall.  Patient denies hitting head and not sure about having loss of consciousness.  She complained of left hip pain and headache.  She says she was getting something out of the refrigerator when she lost her balance and fell.  She reports she takes low-dose aspirin  daily.  She had a CT scan of her head and cervical spine that showed no acute intracranial injury and no cervical spine injury.  Hip x-ray show left femoral neck fracture.  Dr. Phyllis Breeze with orthopedic service was consulted and recommended admission for surgical management. Procedure(s):  HEMIARTHROPLASTY (BIPOLAR) HIP, DIRECT LATERAL  FOR FRACTURE (Left) as a surgical intervention   Clinical Impression  Patient limited due to pain but tolerates PT Evaluation well. Patient reports at baseline, she is (I) with ambulation with AD, and mostly (I) with ADLs and iADLs, only requiring assist with donning R footwear from previous R THA last year. On this date, patient required mod A for bed mobility, CGA/min A for functional transfers (including STS and bed>BSC>recliner), and ambulation due to some unsteadiness, LE weakness, and pain. Patient reports living with her husband who uses an AD himself and sometimes has good or bad days. Patient has a RW and shower seat from previous surgeries. Would recommend BSC once patient is home due limited ambulation tolerance at this time. Patient will benefit from continued skilled  physical therapy acutely and in recommended venue in order to address LE strength, endurance, and balance/coordination prior to returning home for her safety to return her to PLOF and max independence.        If plan is discharge home, recommend the following: A lot of help with walking and/or transfers;A lot of help with bathing/dressing/bathroom;Assistance with cooking/housework;Assist for transportation;Help with stairs or ramp for entrance   Can travel by private vehicle        Equipment Recommendations    Recommendations for Other Services       Functional Status Assessment Patient has had a recent decline in their functional status and demonstrates the ability to make significant improvements in function in a reasonable and predictable amount of time.     Precautions / Restrictions Precautions Precautions: Fall (Lateral hip) Recall of Precautions/Restrictions: Intact Restrictions Weight Bearing Restrictions Per Provider Order: Yes LLE Weight Bearing Per Provider Order: Weight bearing as tolerated      Mobility  Bed Mobility Overal bed mobility: Needs Assistance Bed Mobility: Supine to Sit     Supine to sit: Mod assist     General bed mobility comments: A for LLE handling/support t/o and forward trunk translation through pull to sit 2/2 general weakness and pain    Transfers Overall transfer level: Needs assistance Equipment used: Rolling walker (2 wheels) Transfers: Sit to/from Stand, Bed to chair/wheelchair/BSC Sit to Stand: Min assist   Step pivot transfers: Min assist, Contact guard assist       General transfer comment: STS: v cueing for form to avoid excessive pain through R weight shift and extending L knee as well as min A to complete STS from  bed and BSC w/ RW. Bed>BSC>recliner CGA/min A for steadying t/o.    Ambulation/Gait Ambulation/Gait assistance: Contact guard assist, Min assist Gait Distance (Feet): 5 Feet Assistive device: Rolling walker  (2 wheels) Gait Pattern/deviations: Decreased step length - right, Decreased stance time - left, Decreased stride length, Decreased weight shift to left, Antalgic, Trunk flexed, Step-to pattern Gait velocity: Decreased     General Gait Details: Pt mildly unsteady during gait trial with CGA/min A and use of RW. very small and slow steps. Multiple steps needed to make turns. SOB noted t/o. No reports of dizziness or dyspnea.  Stairs            Wheelchair Mobility     Tilt Bed    Modified Rankin (Stroke Patients Only)       Balance Overall balance assessment: Needs assistance Sitting-balance support: Bilateral upper extremity supported, Feet supported, No upper extremity supported Sitting balance-Leahy Scale: Fair Sitting balance - Comments: Good/fair seated EOB, w/ varying UE support   Standing balance support: Bilateral upper extremity supported, During functional activity, Reliant on assistive device for balance Standing balance-Leahy Scale: Fair Standing balance comment: Fair w/ RW and CGA/min A during gait trial                             Pertinent Vitals/Pain Pain Assessment Pain Assessment: 0-10 Pain Score: 6  Pain Location: L hip Pain Descriptors / Indicators: Aching Pain Intervention(s): Limited activity within patient's tolerance, RN gave pain meds during session, Monitored during session, Repositioned    Home Living Family/patient expects to be discharged to:: Private residence Living Arrangements: Spouse/significant other Available Help at Discharge: Family;Available PRN/intermittently Type of Home: House Home Access: Stairs to enter Entrance Stairs-Rails: None Entrance Stairs-Number of Steps: 1   Home Layout: One level Home Equipment: Agricultural consultant (2 wheels);Hand held shower head;Grab bars - tub/shower      Prior Function Prior Level of Function : Independent/Modified Independent;Driving             Mobility Comments: Community  ambulator without AD, Had since returned to driving following R hip surgery last year ADLs Comments: Was still receiving some help with LE dressing following R hip surgery last year     Extremity/Trunk Assessment   Upper Extremity Assessment Upper Extremity Assessment: Overall WFL for tasks assessed;Generalized weakness (Generally weak. Reports AC joint dysfunction but shoulder flexion ROM WFL, abd to 90 deg. Shoulder flex 4-/5 MMT)    Lower Extremity Assessment Lower Extremity Assessment: Generalized weakness;RLE deficits/detail;LLE deficits/detail RLE Deficits / Details: Residual weakness from previous R THA last year, hip flex 3+/5. Unable to donn sock on this date, total assist needed. 4+/5 DF MMT RLE Coordination: decreased gross motor;decreased fine motor LLE Deficits / Details: Surgical side. Unable to fully assess d/t pain. Adequate for functional transfers. LLE: Unable to fully assess due to pain LLE Coordination: decreased gross motor;decreased fine motor    Cervical / Trunk Assessment Cervical / Trunk Assessment: Kyphotic  Communication   Communication Communication: No apparent difficulties    Cognition Arousal: Alert Behavior During Therapy: WFL for tasks assessed/performed   PT - Cognitive impairments: No apparent impairments                         Following commands: Intact       Cueing Cueing Techniques: Verbal cues     General Comments      Exercises  Assessment/Plan    PT Assessment Patient needs continued PT services;All further PT needs can be met in the next venue of care  PT Problem List Decreased strength;Decreased range of motion;Decreased activity tolerance;Pain;Decreased balance;Decreased mobility       PT Treatment Interventions DME instruction;Gait training;Patient/family education;Stair training;Functional mobility training;Therapeutic activities;Therapeutic exercise;Balance training    PT Goals (Current goals can be  found in the Care Plan section)  Acute Rehab PT Goals Patient Stated Goal: return home following short rehab stay PT Goal Formulation: With patient Time For Goal Achievement: 04/16/24 Potential to Achieve Goals: Good    Frequency Min 5X/week     Co-evaluation               AM-PAC PT "6 Clicks" Mobility  Outcome Measure Help needed turning from your back to your side while in a flat bed without using bedrails?: A Lot Help needed moving from lying on your back to sitting on the side of a flat bed without using bedrails?: A Lot Help needed moving to and from a bed to a chair (including a wheelchair)?: A Little Help needed standing up from a chair using your arms (e.g., wheelchair or bedside chair)?: A Little Help needed to walk in hospital room?: A Little Help needed climbing 3-5 steps with a railing? : A Lot 6 Click Score: 15    End of Session Equipment Utilized During Treatment: Gait belt Activity Tolerance: Patient tolerated treatment well;Patient limited by pain Patient left: in chair;with call bell/phone within reach Nurse Communication: Mobility status PT Visit Diagnosis: Difficulty in walking, not elsewhere classified (R26.2);Pain;Other abnormalities of gait and mobility (R26.89);History of falling (Z91.81);Muscle weakness (generalized) (M62.81) Pain - Right/Left: Left Pain - part of body: Hip    Time: 1610-9604 PT Time Calculation (min) (ACUTE ONLY): 37 min   Charges:   PT Evaluation $PT Eval Moderate Complexity: 1 Mod PT Treatments $Gait Training: 8-22 mins $Therapeutic Activity: 8-22 mins PT General Charges $$ ACUTE PT VISIT: 1 Visit         11:30 AM, 04/14/24 Marysue Sola, PT, DPT  with [facility]

## 2024-04-15 DIAGNOSIS — D508 Other iron deficiency anemias: Secondary | ICD-10-CM

## 2024-04-15 DIAGNOSIS — S72002D Fracture of unspecified part of neck of left femur, subsequent encounter for closed fracture with routine healing: Secondary | ICD-10-CM | POA: Diagnosis not present

## 2024-04-15 DIAGNOSIS — Z720 Tobacco use: Secondary | ICD-10-CM

## 2024-04-15 DIAGNOSIS — Z860100 Personal history of colon polyps, unspecified: Secondary | ICD-10-CM

## 2024-04-15 DIAGNOSIS — E781 Pure hyperglyceridemia: Secondary | ICD-10-CM | POA: Diagnosis not present

## 2024-04-15 DIAGNOSIS — E538 Deficiency of other specified B group vitamins: Secondary | ICD-10-CM | POA: Diagnosis not present

## 2024-04-15 LAB — BASIC METABOLIC PANEL WITH GFR
Anion gap: 8 (ref 5–15)
BUN: 13 mg/dL (ref 8–23)
CO2: 26 mmol/L (ref 22–32)
Calcium: 8.9 mg/dL (ref 8.9–10.3)
Chloride: 100 mmol/L (ref 98–111)
Creatinine, Ser: 0.62 mg/dL (ref 0.44–1.00)
GFR, Estimated: >60 mL/min (ref >60)
Glucose, Bld: 107 mg/dL — ABNORMAL HIGH (ref 70–99)
Potassium: 3.8 mmol/L (ref 3.5–5.1)
Sodium: 134 mmol/L — ABNORMAL LOW (ref 135–145)

## 2024-04-15 LAB — CBC
HCT: 35.1 % — ABNORMAL LOW (ref 36.0–46.0)
Hemoglobin: 11.8 g/dL — ABNORMAL LOW (ref 12.0–15.0)
MCH: 34.6 pg — ABNORMAL HIGH (ref 26.0–34.0)
MCHC: 33.6 g/dL (ref 30.0–36.0)
MCV: 102.9 fL — ABNORMAL HIGH (ref 80.0–100.0)
Platelets: 157 10*3/uL (ref 150–400)
RBC: 3.41 MIL/uL — ABNORMAL LOW (ref 3.87–5.11)
RDW: 12.4 % (ref 11.5–15.5)
WBC: 7.9 10*3/uL (ref 4.0–10.5)
nRBC: 0 % (ref 0.0–0.2)

## 2024-04-15 NOTE — Progress Notes (Signed)
   04/15/24 1447  [place] Medicaid [place]2 Level of Care Screening Tool  Provider Number [facility]  Facility and Address AP  Current Level of Care Hospital  Requested Level of Care SNF  Discharge Plan SNF  Orientation Self;Time;Situation;Place  Personal Care Assistance Bathing;Feeding;Dressing  Bathing Assistance Limited assistance  Feeding assistance Independent  Dressing Assistance Independent  Ambulatory Status Limited Assist  Functional Limitations Sight;Hearing;Speech  Sight Info Impaired (glasses)  Hearing Info Adequate  Speech Info Adequate  Bladder Continent  Bowel Continent  Communication of Needs Verbally  Respiration Normal  Nutrition Status Diet (Regular)  Special Care Factors PT (By licensed PT);OT (By licensed OT)  PT Frequency 5 times weekly  OT Frequency 5 times weekly  Additional Special Care Factors Code Status;Allergies  Code Status Info Full  Allergies Info Tetracylines & related  Contractures Info Not present  Additional Information [phone]   CM spoke with patient at bedside. Patient agreed to be discharged to SNF. Patient does not want to be discharged to [facility] or [facility].

## 2024-04-15 NOTE — Progress Notes (Signed)
 Physical Therapy Treatment Patient Details Name: Jody Wilson MRN: [id number] DOB: May 24, 1951 Today's Date: 04/15/2024   History of Present Illness Jody Wilson is a 73 year old female with history of anemia, GAD, depression, aortic atherosclerosis, GERD, hyperlipidemia, tobacco, hypertension, orthostatic hypotension, osteoporosis, status post right hip fracture from a mechanical fall in February 2024 and underwent a right hip hemiarthroplasty by Dr. Ernesta Heading.  She presented to ED by EMS from home after an unwitnessed mechanical fall.  Patient denies hitting head and not sure about having loss of consciousness.  She complained of left hip pain and headache.  She says she was getting something out of the refrigerator when she lost her balance and fell.  She reports she takes low-dose aspirin  daily.  She had a CT scan of her head and cervical spine that showed no acute intracranial injury and no cervical spine injury.  Hip x-ray show left femoral neck fracture.  Dr. Phyllis Breeze with orthopedic service was consulted and recommended admission for surgical management. Procedure(s):  HEMIARTHROPLASTY (BIPOLAR) HIP, DIRECT LATERAL  FOR FRACTURE (Left) as a surgical intervention    PT Comments  Patient was limited due to pain and fatigue but tolerated PT session well and showing progress towards goals. Patient was received sitting in recliner. Began with ankle pumps and LAQ in seated position for general LE strengthening. Patient required min A for STS and CGA/min A during ambulation in which she ambulated ~12 ft with forward and backwards stepping. SpO2 monitored t/o. Pt received on 2 Lpm. On RA at rest, SpO2 drops to 86%. During mobility, on 1 Lpm, SpO2 remains around 88-89%. Pt returned to bed and 2 Lpm at end of session. Nursing staff notified. Patient will benefit from continued skilled physical therapy acutely and in recommended venue in order to work toward personal goals and return to PLOF.      If plan  is discharge home, recommend the following: A lot of help with walking and/or transfers;A lot of help with bathing/dressing/bathroom;Assistance with cooking/housework;Assist for transportation;Help with stairs or ramp for entrance   Can travel by private vehicle        Equipment Recommendations  None recommended by PT    Recommendations for Other Services       Precautions / Restrictions Precautions Precautions: Fall;Other (comment) (Lateral hip) Recall of Precautions/Restrictions: Intact Restrictions Weight Bearing Restrictions Per Provider Order: Yes LLE Weight Bearing Per Provider Order: Weight bearing as tolerated     Mobility  Bed Mobility Overal bed mobility: Needs Assistance Bed Mobility: Sit to Supine       Sit to supine: Min assist, Mod assist   General bed mobility comments: Pt recived in recliner. Sit>supine at end of session required min/mod A for LE handling    Transfers Overall transfer level: Needs assistance Equipment used: Rolling walker (2 wheels) Transfers: Sit to/from Stand, Bed to chair/wheelchair/BSC Sit to Stand: Min assist   Step pivot transfers: Contact guard assist       General transfer comment: Min A needed following 2 attempts for STS from recliner using RW and 1:1 hand placement. CGA for step pivot using RW. Mild unsteadiness. Cueing for proximity to RW t/o.    Ambulation/Gait Ambulation/Gait assistance: Contact guard assist, Min assist Gait Distance (Feet): 12 Feet Assistive device: Rolling walker (2 wheels) Gait Pattern/deviations: Decreased step length - right, Decreased stance time - left, Decreased stride length, Decreased weight shift to left, Antalgic, Trunk flexed, Step-to pattern Gait velocity: Decreased     General Gait Details:  Pt able to take forward and backwards steps w/ RW and CGA, totaling ~28ft. V cueing during for pursed lip breathing due to dec in SpO2, and for proximity to RW t/o, pt demo inc distance from RW  especially when stepping backwards. Mild unsteadiness remains, slow labored movement.   Stairs     Wheelchair Mobility     Tilt Bed    Modified Rankin (Stroke Patients Only)       Balance Overall balance assessment: Needs assistance Sitting-balance support: Bilateral upper extremity supported, Feet supported, No upper extremity supported Sitting balance-Leahy Scale: Good Sitting balance - Comments: Seated EOB and in recliner   Standing balance support: Bilateral upper extremity supported, During functional activity, Reliant on assistive device for balance Standing balance-Leahy Scale: Fair Standing balance comment: Fair w/ RW and CGA/min A during gait trial              Communication Communication Communication: No apparent difficulties  Cognition Arousal: Alert Behavior During Therapy: WFL for tasks assessed/performed   PT - Cognitive impairments: No apparent impairments         Following commands: Intact      Cueing Cueing Techniques: Verbal cues  Exercises General Exercises - Lower Extremity Ankle Circles/Pumps: AROM, Strengthening, Left, 10 reps, Seated Long Arc Quad: AROM, Strengthening, Left, 10 reps, Seated    General Comments        Pertinent Vitals/Pain Pain Assessment Pain Assessment: Faces Faces Pain Scale: Hurts even more Pain Location: L hip Pain Intervention(s): Limited activity within patient's tolerance, Monitored during session, Repositioned, Other (comment) (Pt reports pain medication given ~30 min prior to session began)    Home Living                          Prior Function            PT Goals (current goals can now be found in the care plan section) Acute Rehab PT Goals Patient Stated Goal: return home following short rehab stay PT Goal Formulation: With patient Time For Goal Achievement: 04/16/24 Potential to Achieve Goals: Good Progress towards PT goals: Progressing toward goals    Frequency    Min  5X/week      PT Plan      Co-evaluation              AM-PAC PT "6 Clicks" Mobility   Outcome Measure  Help needed turning from your back to your side while in a flat bed without using bedrails?: A Lot Help needed moving from lying on your back to sitting on the side of a flat bed without using bedrails?: A Lot Help needed moving to and from a bed to a chair (including a wheelchair)?: A Little Help needed standing up from a chair using your arms (e.g., wheelchair or bedside chair)?: A Little Help needed to walk in hospital room?: A Little Help needed climbing 3-5 steps with a railing? : A Lot 6 Click Score: 15    End of Session Equipment Utilized During Treatment: Gait belt Activity Tolerance: Patient tolerated treatment well;Patient limited by pain;Patient limited by fatigue Patient left: in bed;with call bell/phone within reach Nurse Communication: Mobility status PT Visit Diagnosis: Difficulty in walking, not elsewhere classified (R26.2);Pain;Other abnormalities of gait and mobility (R26.89);History of falling (Z91.81);Muscle weakness (generalized) (M62.81) Pain - Right/Left: Right Pain - part of body: Hip     Time: 1610-9604 PT Time Calculation (min) (ACUTE ONLY): 27 min  Charges:    $  Gait Training: 8-22 mins $Therapeutic Exercise: 8-22 mins PT General Charges $$ ACUTE PT VISIT: 1 Visit                     10:22 AM, 04/15/24 Marysue Sola, PT, DPT [place] with [facility]

## 2024-04-15 NOTE — Progress Notes (Addendum)
 PROGRESS NOTE   Jody Wilson  ZOX:[id number] DOB: 1951-07-06 DOA: 04/13/2024 PCP: Dettinger, Lucio Sabin, MD   Chief Complaint  Patient presents with   Fall   Level of care: Med-Surg  Brief Admission History:  73 year old female with history of anemia, GAD, depression, aortic atherosclerosis, GERD, hyperlipidemia, tobacco, hypertension, orthostatic hypotension, osteoporosis, status post right hip fracture from a mechanical fall in February 2024 and underwent a right hip hemiarthroplasty by Dr. Ernesta Heading.  She presented to ED by EMS from home after an unwitnessed mechanical fall.  Patient denies hitting head and not sure about having loss of consciousness.  She complained of left hip pain and headache.  She says she was getting something out of the refrigerator when she lost her balance and fell.  She reports she takes low-dose aspirin  daily.  She had a CT scan of her head and cervical spine that showed no acute intracranial injury and no cervical spine injury.  Hip x-ray show left femoral neck fracture.  Dr. Phyllis Breeze with orthopedic service was consulted and recommended admission for surgical management.   Assessment and Plan:  Acute left hip femoral neck fracture s/p mechanical fall Postop s/p ORIF bipolar hip hemiarthroplasty, direct lateral, left on 04/13/24 - continue surgical postop orders as written  - up with PT today for ambulation, recommendation was for SNF, TOC consulted for placement  - consultation with orthopedics Dr. Phyllis Breeze - hip fracture orderset bundle initiated - pain and nausea medication - vitamin D  repleted (100.18) - postop plan per Dr. Phyllis Breeze:  Weightbearing as tolerated Direct lateral hip precautions DVT prophylaxis for 30 days Remove staples at 12 to 14 days Postop appointment scheduled for 28 days   Hypokalemia - Mg was repleted  - potassium repleted     Dyslipidemia - resumed home pravastatin  and ezetimibe    GERD - pantoprazole  ordered    Essential  hypertension  - resumed home hydrochlorothiazide    DVT prophylaxis: Gregg heparin   Code Status: Full  Family Communication: son at bedside  Disposition: working on SNF placement     Consultants:  Orthopedics   Procedures:  ORIF left hip 04/13/24  Antimicrobials:    Subjective: Pt reports that she had a "bad experience" at [facility], but willing to discuss another SNF, she has some pain when getting up in chair but has been manageable.    Objective: Vitals:   04/14/24 0544 04/14/24 1435 04/14/24 2032 04/15/24 0359  BP: 115/62 109/73 106/72 112/82  Pulse: 65 82 88 86  Resp: 18 14 20 20   Temp: 97.6 F (36.4 C) 97.8 F (36.6 C) 98.7 F (37.1 C) 98.5 F (36.9 C)  TempSrc: Oral Oral Oral Oral  SpO2: 93% 92% 90% 90%  Weight:      Height:        Intake/Output Summary (Last 24 hours) at 04/15/2024 1105 Last data filed at 04/15/2024 0641 Gross per 24 hour  Intake 240 ml  Output --  Net 240 ml   Filed Weights   04/13/24 0524 04/13/24 1346  Weight: 64.4 kg 64.4 kg   Examination:  General exam: sitting up in chair, Appears calm and comfortable  Respiratory system: Clear to auscultation. Respiratory effort normal. Cardiovascular system: normal S1 & S2 heard. No JVD, murmurs, rubs, gallops or clicks. No pedal edema. Gastrointestinal system: Abdomen is nondistended, soft and nontender. No organomegaly or masses felt. Normal bowel sounds heard. Central nervous system: Alert and oriented. No focal neurological deficits. Extremities: bandages clean and dry and intact, warm and  dry lower extremities with palpable DP pulses bilateral.  Skin: No rashes, lesions or ulcers. Psychiatry: Judgement and insight appear normal. Mood & affect appropriate.   Data Reviewed: I have personally reviewed following labs and imaging studies  CBC: Recent Labs  Lab 04/13/24 0604 04/14/24 0426 04/15/24 0457  WBC 9.6 6.4 7.9  NEUTROABS 7.5  --   --   HGB 14.1 11.6* 11.8*  HCT 40.5 35.1* 35.1*   MCV 99.5 104.2* 102.9*  PLT 210 153 157    Basic Metabolic Panel: Recent Labs  Lab 04/13/24 0604 04/13/24 0744 04/14/24 0426 04/15/24 0457  NA 136  --  136 134*  K 3.3*  --  3.3* 3.8  CL 101  --  100 100  CO2 21*  --  26 26  GLUCOSE 92  --  122* 107*  BUN 18  --  20 13  CREATININE 0.86  --  1.06* 0.62  CALCIUM  9.2  --  8.2* 8.9  MG  --  1.4* 2.1  --     CBG: No results for input(s): "GLUCAP" in the last 168 hours.  Recent Results (from the past 240 hours)  Surgical PCR screen     Status: None   Collection Time: 04/13/24 10:17 AM   Specimen: Nasal Mucosa; Nasal Swab  Result Value Ref Range Status   MRSA, PCR NEGATIVE NEGATIVE Final   Staphylococcus aureus NEGATIVE NEGATIVE Final    Comment: (NOTE) The Xpert SA Assay (FDA approved for NASAL specimens in patients 34 years of age and older), is one component of a comprehensive surveillance program. It is not intended to diagnose infection nor to guide or monitor treatment. Performed at [facility], [address] [place], [place] [address]      Radiology Studies: DG HIP UNILAT WITH PELVIS 2-3 VIEWS LEFT Result Date: 04/13/2024 CLINICAL DATA:  Status post left hip hemiarthroplasty. EXAM: DG HIP (WITH OR WITHOUT PELVIS) 2-3V LEFT COMPARISON:  Pelvis and left hip radiographs 04/13/2024 FINDINGS: There is diffuse decreased bone mineralization. Interval left hip hemiarthroplasty. Partial visualization of prior right hip hemiarthroplasty. No perihardware lucency is seen to indicate hardware failure or loosening. Expected postoperative changes of lateral left hip subcutaneous air and lateral left hip surgical skin staples. Mild bilateral sacroiliac subchondral sclerosis. IMPRESSION: Interval left hip hemiarthroplasty without evidence of hardware failure. Electronically Signed   By: Bertina Broccoli M.D.   On: 04/13/2024 18:58   Scheduled Meds:  aspirin  EC  81 mg Oral Daily   buPROPion   150 mg Oral Daily   calcium -vitamin D   1  tablet Oral Q lunch   citalopram   40 mg Oral Daily   docusate sodium   100 mg Oral BID   enoxaparin (LOVENOX) injection  40 mg Subcutaneous Q24H   ezetimibe   10 mg Oral Daily   feeding supplement  237 mL Oral BID BM   hydrochlorothiazide   12.5 mg Oral Daily   irbesartan   150 mg Oral QPM   multivitamin with minerals  1 tablet Oral Daily   naproxen  250 mg Oral BID WC   ondansetron  (ZOFRAN ) IV  4 mg Intravenous Once   pantoprazole   40 mg Oral BID   pravastatin   40 mg Oral QPM   Continuous Infusions:   LOS: 2 days   Time spent: 55 mins  Locklan Canoy Lincoln Renshaw, MD How to contact the [facility] Attending or Consulting provider 7A - 7P or covering provider during after hours 7P -7A, for this patient?  Check the care team in [facility]  and look for a) attending/consulting TRH provider listed and b) the TRH team listed Log into [email or web address] to find provider on call.  Locate the TRH provider you are looking for under [facility] and page to a number that you can be directly reached. If you still have difficulty reaching the provider, please page the [facility] (Director on Call) for the Hospitalists listed on amion for assistance.  04/15/2024, 11:05 AM

## 2024-04-16 ENCOUNTER — Encounter (HOSPITAL_COMMUNITY): Payer: Self-pay | Admitting: Orthopedic Surgery

## 2024-04-16 DIAGNOSIS — K219 Gastro-esophageal reflux disease without esophagitis: Secondary | ICD-10-CM | POA: Diagnosis not present

## 2024-04-16 DIAGNOSIS — E876 Hypokalemia: Secondary | ICD-10-CM | POA: Diagnosis not present

## 2024-04-16 DIAGNOSIS — E785 Hyperlipidemia, unspecified: Secondary | ICD-10-CM | POA: Diagnosis not present

## 2024-04-16 DIAGNOSIS — S72002D Fracture of unspecified part of neck of left femur, subsequent encounter for closed fracture with routine healing: Secondary | ICD-10-CM | POA: Diagnosis not present

## 2024-04-16 MED ORDER — ALPRAZOLAM 0.5 MG PO TABS: 0.5000 mg | ORAL_TABLET | Freq: Two times a day (BID) | ORAL | 0 refills | Status: DC | PRN

## 2024-04-16 MED ORDER — ADULT MULTIVITAMIN W/MINERALS CH: 1.0000 | ORAL_TABLET | Freq: Every day | ORAL | Status: DC

## 2024-04-16 MED ORDER — ASPIRIN EC 81 MG PO TBEC
DELAYED_RELEASE_TABLET | ORAL | Status: AC
Start: 2024-04-16 — End: ?

## 2024-04-16 MED ORDER — HYDROCODONE-ACETAMINOPHEN 5-325 MG PO TABS
1.0000 | ORAL_TABLET | Freq: Four times a day (QID) | ORAL | 0 refills | Status: AC | PRN
Start: 2024-04-16 — End: 2024-04-19

## 2024-04-16 MED ORDER — POLYETHYLENE GLYCOL 3350 17 G PO PACK: 17.0000 g | PACK | Freq: Every day | ORAL | Status: AC | PRN

## 2024-04-16 MED ORDER — DOCUSATE SODIUM 100 MG PO CAPS: 100.0000 mg | ORAL_CAPSULE | Freq: Two times a day (BID) | ORAL | Status: AC

## 2024-04-16 MED ORDER — ENSURE ENLIVE PO LIQD: 237.0000 mL | Freq: Two times a day (BID) | ORAL | Status: DC

## 2024-04-16 NOTE — TOC Progression Note (Signed)
 Transition of Care [facility]) - Progression Note    Patient Details  Name: Jody Wilson MRN: [id number] Date of Birth: 1951/06/01  [facility] CM/SW Contact  Ander Katos, [place] Phone Number: 04/16/2024, 11:07 AM  Clinical Narrative: Bed offers presented and pt chooses [facility]. Facility notified. CMA starting insurance authorization.       Expected Discharge Plan: Home w Home Health Services Barriers to Discharge: Continued Medical Work up  Expected Discharge Plan and Services In-house Referral: Clinical Social Work Discharge Planning Services: CM Consult Post Acute Care Choice: Durable Medical Equipment Living arrangements for the past 2 months: Single Family Home                 DME Arranged: N/A DME Agency: NA       HH Arranged: Refused SNF           Social Determinants of Health (SDOH) Interventions SDOH Screenings   Food Insecurity: No Food Insecurity (04/13/2024)  Housing: Low Risk  (04/13/2024)  Transportation Needs: No Transportation Needs (04/13/2024)  Utilities: Not At Risk (04/13/2024)  Alcohol Screen: Low Risk  (12/27/2023)  Depression (PHQ2-9): Medium Risk (12/27/2023)  Financial Resource Strain: Low Risk  (12/27/2023)  Physical Activity: Inactive (12/27/2023)  Social Connections: Socially Integrated (04/13/2024)  Stress: No Stress Concern Present (12/27/2023)  Tobacco Use: Medium Risk (04/13/2024)  Health Literacy: Adequate Health Literacy (12/27/2023)    Readmission Risk Interventions    04/13/2024    9:01 AM  Readmission Risk Prevention Plan  Medication Screening Complete  Transportation Screening Complete

## 2024-04-16 NOTE — Progress Notes (Signed)
 Subjective: 3 Days Post-Op Procedure(s) (LRB): HEMIARTHROPLASTY (BIPOLAR) HIP, DIRECT LATERAL  FOR FRACTURE (Left) Patient reports pain as moderate.    Objective: Vital signs in last 24 hours: Temp:  [97.9 F (36.6 C)-98.3 F (36.8 C)] 98.3 F (36.8 C) (04/28 0434) Pulse Rate:  [78-92] 78 (04/28 0434) Resp:  [18-19] 19 (04/28 0434) BP: (96-113)/(59-76) 98/68 (04/28 0434) SpO2:  [90 %-93 %] 92 % (04/28 0434)  Intake/Output from previous day: No intake/output data recorded. Intake/Output this shift: No intake/output data recorded.  Recent Labs    04/14/24 0426 04/15/24 0457  HGB 11.6* 11.8*   Recent Labs    04/14/24 0426 04/15/24 0457  WBC 6.4 7.9  RBC 3.37* 3.41*  HCT 35.1* 35.1*  PLT 153 157   Recent Labs    04/14/24 0426 04/15/24 0457  NA 136 134*  K 3.3* 3.8  CL 100 100  CO2 26 26  BUN 20 13  CREATININE 1.06* 0.62  GLUCOSE 122* 107*  CALCIUM  8.2* 8.9   No results for input(s): "LABPT", "INR" in the last 72 hours.  Neurologically intact Neurovascular intact Sensation intact distally Intact pulses distally Dorsiflexion/Plantar flexion intact Incision: no drainage and scant drainage   Assessment/Plan: 3 Days Post-Op Procedure(s) (LRB): HEMIARTHROPLASTY (BIPOLAR) HIP, DIRECT LATERAL  FOR FRACTURE (Left) Discharge to SNF      [facility] 04/16/2024, 8:09 AM

## 2024-04-16 NOTE — Progress Notes (Signed)
 SATURATION QUALIFICATIONS: (This note is used to comply with regulatory documentation for home oxygen)  Patient Saturations on Room Air at Rest = 90%  Patient Saturations on Room Air while Ambulating = 85%  Patient Saturations on 2 Liters of oxygen while Ambulating = 94%  Please briefly explain why patient needs home oxygen: Patient is not able to maintain an acceptable oxygen saturation on her own.

## 2024-04-16 NOTE — Discharge Instructions (Signed)
 PLEASE REMOVE STAPLES IN 2 WEEKS AROUND 04/27/24   Postop plan per Dr. Phyllis Breeze orthopedics  Weightbearing as tolerated Direct lateral hip precautions DVT prophylaxis for 30 days Remove staples at 12 to 14 days Postop appointment scheduled for 28 days   IMPORTANT INFORMATION: PAY CLOSE ATTENTION   PHYSICIAN DISCHARGE INSTRUCTIONS  Follow with Primary care provider  Dettinger, Lucio Sabin, MD  and other consultants as instructed by your Hospitalist Physician  SEEK MEDICAL CARE OR RETURN TO EMERGENCY ROOM IF SYMPTOMS COME BACK, WORSEN OR NEW PROBLEM DEVELOPS   Please note: You were cared for by a hospitalist during your hospital stay. Every effort will be made to forward records to your primary care provider.  You can request that your primary care provider send for your hospital records if they have not received them.  Once you are discharged, your primary care physician will handle any further medical issues. Please note that NO REFILLS for any discharge medications will be authorized once you are discharged, as it is imperative that you return to your primary care physician (or establish a relationship with a primary care physician if you do not have one) for your post hospital discharge needs so that they can reassess your need for medications and monitor your lab values.  Please get a complete blood count and chemistry panel checked by your Primary MD at your next visit, and again as instructed by your Primary MD.  Get Medicines reviewed and adjusted: Please take all your medications with you for your next visit with your Primary MD  Laboratory/radiological data: Please request your Primary MD to go over all hospital tests and procedure/radiological results at the follow up, please ask your primary care provider to get all Hospital records sent to his/her office.  In some cases, they will be blood work, cultures and biopsy results pending at the time of your discharge. Please request that  your primary care provider follow up on these results.  If you are diabetic, please bring your blood sugar readings with you to your follow up appointment with primary care.    Please call and make your follow up appointments as soon as possible.    Also Note the following: If you experience worsening of your admission symptoms, develop shortness of breath, life threatening emergency, suicidal or homicidal thoughts you must seek medical attention immediately by calling 911 or calling your MD immediately  if symptoms less severe.  You must read complete instructions/literature along with all the possible adverse reactions/side effects for all the Medicines you take and that have been prescribed to you. Take any new Medicines after you have completely understood and accpet all the possible adverse reactions/side effects.   Do not drive when taking Pain medications or sleeping medications (Benzodiazepines)  Do not take more than prescribed Pain, Sleep and Anxiety Medications. It is not advisable to combine anxiety,sleep and pain medications without talking with your primary care practitioner  Special Instructions: If you have smoked or chewed Tobacco  in the last 2 yrs please stop smoking, stop any regular Alcohol  and or any Recreational drug use.  Wear Seat belts while driving.  Do not drive if taking any narcotic, mind altering or controlled substances or recreational drugs or alcohol.

## 2024-04-16 NOTE — TOC Transition Note (Signed)
 Transition of Care [facility]) - Discharge Note   Patient Details  Name: Jody Wilson MRN: [id number] Date of Birth: August 07, 1951  Transition of [facility]) CM/SW Contact:  Ander Katos, LCSW Phone Number: 04/16/2024, 2:39 PM   Clinical Narrative:  Pt d/c to [facility] today. Pt and SNF aware and agreeable. SNF authorization received. D/C summary sent to SNF. RN given number to call report. Pt requests [facility] to transport. LCSW to arrange.       Final next level of care: Skilled Nursing Facility Barriers to Discharge: Barriers Resolved   Patient Goals and CMS Choice Patient states their goals for this hospitalization and ongoing recovery are:: return back home CMS Medicare.gov Compare Post Acute Care list provided to:: Patient Choice offered to / list presented to : Patient      Discharge Placement              Patient chooses bed at: [facility] Patient to be transferred to facility by: [facility] Name of family member notified: pt only Patient and family notified of of transfer: 04/16/24  Discharge Plan and Services Additional resources added to the After Visit Summary for   In-house Referral: Clinical Social Work Discharge Planning Services: CM Consult Post Acute Care Choice: Durable Medical Equipment          DME Arranged: N/A DME Agency: NA       HH Arranged: Refused SNF          Social Drivers of Health (SDOH) Interventions SDOH Screenings   Food Insecurity: No Food Insecurity (04/13/2024)  Housing: Low Risk  (04/13/2024)  Transportation Needs: No Transportation Needs (04/13/2024)  Utilities: Not At Risk (04/13/2024)  Alcohol Screen: Low Risk  (12/27/2023)  Depression (PHQ2-9): Medium Risk (12/27/2023)  Financial Resource Strain: Low Risk  (12/27/2023)  Physical Activity: Inactive (12/27/2023)  Social Connections: Socially Integrated (04/13/2024)  Stress: No Stress Concern Present (12/27/2023)  Tobacco Use:  Medium Risk (04/13/2024)  Health Literacy: Adequate Health Literacy (12/27/2023)     Readmission Risk Interventions    04/13/2024    9:01 AM  Readmission Risk Prevention Plan  Medication Screening Complete  Transportation Screening Complete

## 2024-04-16 NOTE — NC FL2 (Signed)
 [place]  MEDICAID FL2 LEVEL OF CARE FORM     IDENTIFICATION  Patient Name: Jody Wilson Birthdate: Jun 27, 1951 Sex: female Admission Date (Current Location): 04/13/2024  [facility] and [place] Number:  [employer] and Address:  [facility],  [address], [name] [address]      Provider Number: [id number]  Attending Physician Name and Address:  Rayfield Cairo, MD  Relative Name and Phone Number:       Current Level of Care: Hospital Recommended Level of Care: Skilled Nursing Facility Prior Approval Number:    Date Approved/Denied:   PASRR Number: [id number]  Discharge Plan: SNF    Current Diagnoses: Patient Active Problem List   Diagnosis Date Noted   Closed fracture of neck of left femur (HCC) 04/13/2024   Hypokalemia 04/13/2024   Closed right hip fracture (HCC) 02/09/2023   Pulmonary nodule 02/09/2023   Hypertriglyceridemia 01/22/2022   Abnormal findings on diagnostic imaging of liver and biliary tract 12/09/2021   Diarrhea 12/09/2021   History of colonic polyps 12/09/2021   Anxiety and depression 06/23/2020   Hiatal hernia 06/23/2020   Thoracic aortic atherosclerosis (HCC) 06/23/2020   Tobacco abuse 04/25/2020   Involuntary muscle jerks while sleeping 09/26/2019   B12 deficiency 09/25/2019   Iron deficiency anemia 09/25/2019   Elevated liver function tests 09/07/2019   Osteoporosis 02/09/2017   History of vertebral fracture 02/09/2017   GERD (gastroesophageal reflux disease) 03/11/2016   Hyperlipidemia with target LDL less than 100 02/19/2015   Hypertension 03/18/2014    Orientation RESPIRATION BLADDER Height & Weight     Self, Time, Situation, Place  Normal Continent Weight: 142 lb (64.4 kg) Height:  5\' 5"  (165.1 cm)  BEHAVIORAL SYMPTOMS/MOOD NEUROLOGICAL BOWEL NUTRITION STATUS      Continent Diet (See d/c summary)  AMBULATORY STATUS COMMUNICATION OF NEEDS Skin   Extensive Assist Verbally Surgical wounds, Bruising                        Personal Care Assistance Level of Assistance  Bathing, Feeding, Dressing Bathing Assistance: Maximum assistance Feeding assistance: Independent Dressing Assistance: Maximum assistance     Functional Limitations Info  Sight, Hearing, Speech Sight Info: Impaired (glasses) Hearing Info: Adequate Speech Info: Adequate    SPECIAL CARE FACTORS FREQUENCY  PT (By licensed PT), OT (By licensed OT)     PT Frequency: 5 times weekly OT Frequency: 5 times weekly            Contractures Contractures Info: Not present    Additional Factors Info  Code Status, Allergies Code Status Info: Full Allergies Info: Tetracylines & related           Current Medications (04/16/2024):  This is the current hospital active medication list Current Facility-Administered Medications  Medication Dose Route Frequency Provider Last Rate Last Admin   acetaminophen  (TYLENOL ) tablet 325-650 mg  325-650 mg Oral Q6H PRN Darrin Emerald, MD       ALPRAZolam  (XANAX ) tablet 0.5 mg  0.5 mg Oral BID PRN Segars, Jonathan, MD   0.5 mg at 04/15/24 2204   aspirin  EC tablet 81 mg  81 mg Oral Daily Harrison, Stanley E, MD   81 mg at 04/16/24 6295   bisacodyl (DULCOLAX) EC tablet 5 mg  5 mg Oral Daily PRN Darrin Emerald, MD       buPROPion  (WELLBUTRIN  XL) 24 hr tablet 150 mg  150 mg Oral Daily Darrin Emerald, MD  150 mg at 04/16/24 5621   calcium -vitamin D  (OSCAL WITH D) 500-5 MG-MCG per tablet 1 tablet  1 tablet Oral Q lunch Harrison, Stanley E, MD   1 tablet at 04/15/24 1215   citalopram  (CELEXA ) tablet 40 mg  40 mg Oral Daily Harrison, Stanley E, MD   40 mg at 04/16/24 0930   docusate sodium  (COLACE) capsule 100 mg  100 mg Oral BID Harrison, Stanley E, MD   100 mg at 04/16/24 0929   enoxaparin (LOVENOX) injection 40 mg  40 mg Subcutaneous Q24H Darrin Emerald, MD   40 mg at 04/16/24 3086   ezetimibe  (ZETIA ) tablet 10 mg  10 mg Oral Daily Harrison, Stanley E, MD   10 mg at  04/16/24 0931   feeding supplement (ENSURE ENLIVE / ENSURE PLUS) liquid 237 mL  237 mL Oral BID BM Harrison, Stanley E, MD   237 mL at 04/16/24 0929   hydrochlorothiazide  (HYDRODIURIL ) tablet 12.5 mg  12.5 mg Oral Daily Harrison, Stanley E, MD   12.5 mg at 04/16/24 5784   HYDROcodone -acetaminophen  (NORCO) 7.5-325 MG per tablet 1-2 tablet  1-2 tablet Oral Q4H PRN Harrison, Stanley E, MD   1 tablet at 04/15/24 2204   HYDROcodone -acetaminophen  (NORCO/VICODIN) 5-325 MG per tablet 1-2 tablet  1-2 tablet Oral Q4H PRN Harrison, Stanley E, MD   2 tablet at 04/16/24 6962   irbesartan  (AVAPRO ) tablet 150 mg  150 mg Oral QPM Darrin Emerald, MD   150 mg at 04/15/24 1703   menthol-cetylpyridinium (CEPACOL) lozenge 3 mg  1 lozenge Oral PRN Harrison, Stanley E, MD       Or   phenol (CHLORASEPTIC) mouth spray 1 spray  1 spray Mouth/Throat PRN Darrin Emerald, MD       methocarbamol (ROBAXIN) tablet 500 mg  500 mg Oral Q6H PRN Harrison, Stanley E, MD   500 mg at 04/14/24 0355   Or   methocarbamol (ROBAXIN) injection 500 mg  500 mg Intravenous Q6H PRN Harrison, Stanley E, MD       metoCLOPramide (REGLAN) tablet 5-10 mg  5-10 mg Oral Q8H PRN Harrison, Stanley E, MD       Or   metoCLOPramide (REGLAN) injection 5-10 mg  5-10 mg Intravenous Q8H PRN Harrison, Stanley E, MD       morphine  (PF) 2 MG/ML injection 0.5-1 mg  0.5-1 mg Intravenous Q2H PRN Harrison, Stanley E, MD   1 mg at 04/15/24 0319   multivitamin with minerals tablet 1 tablet  1 tablet Oral Daily Darrin Emerald, MD   1 tablet at 04/16/24 0930   naproxen (NAPROSYN) tablet 250 mg  250 mg Oral BID WC Darrin Emerald, MD   250 mg at 04/16/24 0930   ondansetron  (ZOFRAN ) injection 4 mg  4 mg Intravenous Once Harrison, Stanley E, MD       ondansetron  (ZOFRAN ) injection 4 mg  4 mg Intravenous Q6H PRN Harrison, Stanley E, MD   4 mg at 04/13/24 0749   pantoprazole  (PROTONIX ) EC tablet 40 mg  40 mg Oral BID Johnson, Clanford L, MD   40 mg at  04/16/24 0929   polyethylene glycol (MIRALAX  / GLYCOLAX ) packet 17 g  17 g Oral Daily PRN Darrin Emerald, MD       pravastatin  (PRAVACHOL ) tablet 40 mg  40 mg Oral QPM Darrin Emerald, MD   40 mg at 04/15/24 1703   traZODone (DESYREL) tablet 25 mg  25 mg Oral QHS PRN Elsa Halls  E, MD         Discharge Medications: Please see discharge summary for a list of discharge medications.  Relevant Imaging Results:  Relevant Lab Results:   Additional Information [id number]  Ander Katos, LCSW

## 2024-04-16 NOTE — Discharge Summary (Signed)
 Physician Discharge Summary  ZYNIQUE SWEARENGEN YQM:[id number] DOB: [birth year] DOA: 04/13/2024  PCP: Dettinger, Lucio Sabin, MD Orthopedics: Ninette Basque date: 04/13/2024 Discharge date: 04/16/2024  Disposition:  SNF  Recommendations for Outpatient Follow-up:  Follow up with Dr. Phyllis Breeze in 28 days Please remove staples in 2 weeks around 04/27/24  Postop plan per Dr. Barbie Boon as tolerated Direct lateral hip precautions DVT prophylaxis for 30 days Remove staples at 12 to 14 days (around 04/27/24) Postop appointment scheduled for 28 days with Dr. Phyllis Breeze  Discharge Condition: STABLE   CODE STATUS: FULL DIET: regular    Brief Hospitalization Summary: Please see all hospital notes, images, labs for full details of the hospitalization. Admission provider HPI: 73 year old female with history of anemia, GAD, depression, aortic atherosclerosis, GERD, hyperlipidemia, tobacco, hypertension, orthostatic hypotension, osteoporosis, status post right hip fracture from a mechanical fall in February 2024 and underwent a right hip hemiarthroplasty by Dr. Ernesta Heading.  She presented to ED by EMS from home after an unwitnessed mechanical fall.  Patient denies hitting head and not sure about having loss of consciousness.  She complained of left hip pain and headache.  She says she was getting something out of the refrigerator when she lost her balance and fell.  She reports she takes low-dose aspirin  daily.  She had a CT scan of her head and cervical spine that showed no acute intracranial injury and no cervical spine injury.  Hip x-ray show left femoral neck fracture.  Dr. Phyllis Breeze with orthopedic service was consulted and recommended admission for surgical management.  Hospital course by listed problems addressed   Acute left hip femoral neck fracture s/p mechanical fall Postop s/p ORIF bipolar hip hemiarthroplasty, direct lateral, left on 04/13/24 - continue surgical postop orders as written  - up  with PT today for ambulation, recommendation was for SNF, TOC consulted for placement  - consultation with orthopedics Dr. Phyllis Breeze - hip fracture orderset bundle initiated - pain and nausea medication - vitamin D  repleted (100.18) - postop plan per Dr. Phyllis Breeze:  Weightbearing as tolerated Direct lateral hip precautions DVT prophylaxis for 30 days (aspirin  81 mg BID thru 05/13/24 then resume 1 tab daily)  Remove staples at 12 to 14 days (around 04/27/24) Postop appointment scheduled for 28 days with Dr. Phyllis Breeze for postop visit and xrays   Hypokalemia - Mg was repleted  - potassium repleted     Dyslipidemia - resumed home pravastatin  and ezetimibe    GERD - pantoprazole  ordered    Essential hypertension  - resumed home hydrochlorothiazide   Discharge Diagnoses:  Principal Problem:   Closed fracture of neck of left femur (HCC) Active Problems:   Hypertension   Hyperlipidemia with target LDL less than 100   GERD (gastroesophageal reflux disease)   B12 deficiency   Iron deficiency anemia   Tobacco abuse   Anxiety and depression   Thoracic aortic atherosclerosis (HCC)   History of colonic polyps   Hypertriglyceridemia   Hypokalemia   Discharge Instructions:  Allergies as of 04/16/2024       Reactions   Tetracyclines & Related Itching, Swelling        Medication List     TAKE these medications    ALPRAZolam  0.5 MG tablet Commonly known as: XANAX  Take 1 tablet (0.5 mg total) by mouth 2 (two) times daily as needed for anxiety.   aspirin  EC 81 MG tablet TAKE 1 PO BID THRU 05/13/24 THEN RESUME 1 PO DAILY What changed:  how much to take how to  take this when to take this additional instructions   buPROPion  150 MG 24 hr tablet Commonly known as: Wellbutrin  XL Take 1 tablet (150 mg total) by mouth daily.   calcium -vitamin D  500-200 MG-UNIT tablet Commonly known as: OSCAL WITH D Take 1 tablet by mouth daily with lunch.   citalopram  40 MG tablet Commonly  known as: CELEXA  Take 1 tablet (40 mg total) by mouth daily.   cyanocobalamin  1000 MCG/ML injection Commonly known as: VITAMIN B12 INJECT 1 ML IM EVERY 28 DAYS   docusate sodium  100 MG capsule Commonly known as: COLACE Take 1 capsule (100 mg total) by mouth 2 (two) times daily.   ergocalciferol  1.25 MG (50000 UT) capsule Commonly known as: VITAMIN D2 Take 1 capsule (50,000 Units total) by mouth every 14 (fourteen) days.   ezetimibe  10 MG tablet Commonly known as: ZETIA  Take 1 tablet (10 mg total) by mouth daily.   feeding supplement Liqd Take 237 mLs by mouth 2 (two) times daily between meals. Start taking on: April 17, 2024   folic acid  1 MG tablet Commonly known as: FOLVITE  Take 1 mg by mouth every other day.   hydrochlorothiazide  12.5 MG capsule Commonly known as: MICROZIDE  Take 1 capsule (12.5 mg total) by mouth daily.   HYDROcodone -acetaminophen  5-325 MG tablet Commonly known as: NORCO/VICODIN Take 1 tablet by mouth every 6 (six) hours as needed for up to 3 days for severe pain (pain score 7-10).   multivitamin with minerals Tabs tablet Take 1 tablet by mouth daily. Start taking on: April 17, 2024   olmesartan  20 MG tablet Commonly known as: BENICAR  Take 1 tablet (20 mg total) by mouth every evening.   pantoprazole  40 MG tablet Commonly known as: PROTONIX  Take 1 tablet (40 mg total) by mouth daily.   polyethylene glycol 17 g packet Commonly known as: MIRALAX  / GLYCOLAX  Take 17 g by mouth daily as needed for mild constipation.   pravastatin  40 MG tablet Commonly known as: PRAVACHOL  Take 1 tablet (40 mg total) by mouth every evening.        Contact information for after-discharge care     Destination     [facility] .   Service: Skilled Nursing Contact information: [address] [place]  [address] [phone]                    Allergies  Allergen Reactions   Tetracyclines  & Related Itching and Swelling   Allergies as of 04/16/2024       Reactions   Tetracyclines & Related Itching, Swelling        Medication List     TAKE these medications    ALPRAZolam  0.5 MG tablet Commonly known as: XANAX  Take 1 tablet (0.5 mg total) by mouth 2 (two) times daily as needed for anxiety.   aspirin  EC 81 MG tablet TAKE 1 PO BID THRU 05/13/24 THEN RESUME 1 PO DAILY What changed:  how much to take how to take this when to take this additional instructions   buPROPion  150 MG 24 hr tablet Commonly known as: Wellbutrin  XL Take 1 tablet (150 mg total) by mouth daily.   calcium -vitamin D  500-200 MG-UNIT tablet Commonly known as: OSCAL WITH D Take 1 tablet by mouth daily with lunch.   citalopram  40 MG tablet Commonly known as: CELEXA  Take 1 tablet (40 mg total) by mouth daily.   cyanocobalamin  1000 MCG/ML injection Commonly known as: VITAMIN B12 INJECT 1 ML  IM EVERY 28 DAYS   docusate sodium  100 MG capsule Commonly known as: COLACE Take 1 capsule (100 mg total) by mouth 2 (two) times daily.   ergocalciferol  1.25 MG (50000 UT) capsule Commonly known as: VITAMIN D2 Take 1 capsule (50,000 Units total) by mouth every 14 (fourteen) days.   ezetimibe  10 MG tablet Commonly known as: ZETIA  Take 1 tablet (10 mg total) by mouth daily.   feeding supplement Liqd Take 237 mLs by mouth 2 (two) times daily between meals. Start taking on: April 17, 2024   folic acid  1 MG tablet Commonly known as: FOLVITE  Take 1 mg by mouth every other day.   hydrochlorothiazide  12.5 MG capsule Commonly known as: MICROZIDE  Take 1 capsule (12.5 mg total) by mouth daily.   HYDROcodone -acetaminophen  5-325 MG tablet Commonly known as: NORCO/VICODIN Take 1 tablet by mouth every 6 (six) hours as needed for up to 3 days for severe pain (pain score 7-10).   multivitamin with minerals Tabs tablet Take 1 tablet by mouth daily. Start taking on: April 17, 2024   olmesartan  20 MG  tablet Commonly known as: BENICAR  Take 1 tablet (20 mg total) by mouth every evening.   pantoprazole  40 MG tablet Commonly known as: PROTONIX  Take 1 tablet (40 mg total) by mouth daily.   polyethylene glycol 17 g packet Commonly known as: MIRALAX  / GLYCOLAX  Take 17 g by mouth daily as needed for mild constipation.   pravastatin  40 MG tablet Commonly known as: PRAVACHOL  Take 1 tablet (40 mg total) by mouth every evening.        Procedures/Studies: DG HIP UNILAT WITH PELVIS 2-3 VIEWS LEFT Result Date: 04/13/2024 CLINICAL DATA:  Status post left hip hemiarthroplasty. EXAM: DG HIP (WITH OR WITHOUT PELVIS) 2-3V LEFT COMPARISON:  Pelvis and left hip radiographs 04/13/2024 FINDINGS: There is diffuse decreased bone mineralization. Interval left hip hemiarthroplasty. Partial visualization of prior right hip hemiarthroplasty. No perihardware lucency is seen to indicate hardware failure or loosening. Expected postoperative changes of lateral left hip subcutaneous air and lateral left hip surgical skin staples. Mild bilateral sacroiliac subchondral sclerosis. IMPRESSION: Interval left hip hemiarthroplasty without evidence of hardware failure. Electronically Signed   By: Bertina Broccoli M.D.   On: 04/13/2024 18:58   DG HIP UNILAT WITH PELVIS 2-3 VIEWS LEFT Result Date: 04/13/2024 CLINICAL DATA:  Fall EXAM: DG HIP (WITH OR WITHOUT PELVIS) 3V LEFT COMPARISON:  None Available. FINDINGS: Left femoral neck fracture with lateral displacement and medial impaction. Bipolar right hip hemiarthroplasty which is unremarkable where covered. No evidence of pelvic ring fracture. Lower lumbar degeneration. IMPRESSION: Impacted left femoral neck fracture. Electronically Signed   By: Ronnette Coke M.D.   On: 04/13/2024 07:28   DG Chest 1 View Result Date: 04/13/2024 CLINICAL DATA:  Preop respiratory exam.  Left hip fracture. EXAM: CHEST  1 VIEW COMPARISON:  09/28/2023 FINDINGS: Stable cardiomediastinal contours. Large  hiatal hernia. No pleural effusion, airspace consolidation, atelectasis or pneumothorax. Remote healed right clavicle fracture. Signs of previous vertebroplasty within the lower thoracic spine. IMPRESSION: 1. No acute cardiopulmonary abnormalities. 2. Large hiatal hernia. Electronically Signed   By: Kimberley Penman M.D.   On: 04/13/2024 07:26   CT Head Wo Contrast Result Date: 04/13/2024 CLINICAL DATA:  Neck trauma.  Unwitnessed fall with head trauma. EXAM: CT HEAD WITHOUT CONTRAST CT CERVICAL SPINE WITHOUT CONTRAST TECHNIQUE: Multidetector CT imaging of the head and cervical spine was performed following the standard protocol without intravenous contrast. Multiplanar CT image reconstructions of the cervical  spine were also generated. RADIATION DOSE REDUCTION: This exam was performed according to the departmental dose-optimization program which includes automated exposure control, adjustment of the mA and/or kV according to patient size and/or use of iterative reconstruction technique. COMPARISON:  09/28/2023 FINDINGS: CT HEAD FINDINGS Brain: No evidence of acute infarction, hemorrhage, hydrocephalus, extra-axial collection or mass lesion/mass effect. Vascular: No hyperdense vessel or unexpected calcification. Skull: Normal. Negative for fracture or focal lesion. Sinuses/Orbits: Chronic left sphenoid sinusitis with polypoid density and sclerotic wall thickening. CT CERVICAL SPINE FINDINGS Alignment: No traumatic malalignment Skull base and vertebrae: No acute fracture. No primary bone lesion or focal pathologic process. Soft tissues and spinal canal: No prevertebral fluid or swelling. No visible canal hematoma. Disc levels:  C5-6 and C6-7 ACDF with solid arthrodesis. Upper chest: No evidence of acute injury. Old injury at the right coracoclavicular ligament with calcification. IMPRESSION: No evidence of acute intracranial or cervical spine injury. Electronically Signed   By: Ronnette Coke M.D.   On: 04/13/2024  06:38   CT Cervical Spine Wo Contrast Result Date: 04/13/2024 CLINICAL DATA:  Neck trauma.  Unwitnessed fall with head trauma. EXAM: CT HEAD WITHOUT CONTRAST CT CERVICAL SPINE WITHOUT CONTRAST TECHNIQUE: Multidetector CT imaging of the head and cervical spine was performed following the standard protocol without intravenous contrast. Multiplanar CT image reconstructions of the cervical spine were also generated. RADIATION DOSE REDUCTION: This exam was performed according to the departmental dose-optimization program which includes automated exposure control, adjustment of the mA and/or kV according to patient size and/or use of iterative reconstruction technique. COMPARISON:  09/28/2023 FINDINGS: CT HEAD FINDINGS Brain: No evidence of acute infarction, hemorrhage, hydrocephalus, extra-axial collection or mass lesion/mass effect. Vascular: No hyperdense vessel or unexpected calcification. Skull: Normal. Negative for fracture or focal lesion. Sinuses/Orbits: Chronic left sphenoid sinusitis with polypoid density and sclerotic wall thickening. CT CERVICAL SPINE FINDINGS Alignment: No traumatic malalignment Skull base and vertebrae: No acute fracture. No primary bone lesion or focal pathologic process. Soft tissues and spinal canal: No prevertebral fluid or swelling. No visible canal hematoma. Disc levels:  C5-6 and C6-7 ACDF with solid arthrodesis. Upper chest: No evidence of acute injury. Old injury at the right coracoclavicular ligament with calcification. IMPRESSION: No evidence of acute intracranial or cervical spine injury. Electronically Signed   By: Ronnette Coke M.D.   On: 04/13/2024 06:38     Subjective: Pt is agreeable to SNF rehab and reports she is eager to work with PT and ambulate more. No complaints.   Discharge Exam: Vitals:   04/16/24 0434 04/16/24 1237  BP: 98/68 99/69  Pulse: 78 89  Resp: 19 18  Temp: 98.3 F (36.8 C) 98.7 F (37.1 C)  SpO2: 92% 92%   Vitals:   04/15/24 1306  04/15/24 2008 04/16/24 0434 04/16/24 1237  BP: 113/76 (!) 96/59 98/68 99/69   Pulse: 89 92 78 89  Resp: 19 18 19 18   Temp: 97.9 F (36.6 C) 98 F (36.7 C) 98.3 F (36.8 C) 98.7 F (37.1 C)  TempSrc: Oral Oral Oral Oral  SpO2: 90% 93% 92% 92%  Weight:      Height:       General: Pt is alert, awake, not in acute distress Cardiovascular: normal S1/S2 +, no rubs, no gallops Respiratory: CTA bilaterally, no wheezing, no rhonchi Abdominal: Soft, NT, ND, bowel sounds + Extremities: bandages clean, dry, lower extremities warm with palpable DP pulses   The results of significant diagnostics from this hospitalization (including imaging, microbiology, ancillary and  laboratory) are listed below for reference.     Microbiology: Recent Results (from the past 240 hours)  Surgical PCR screen     Status: None   Collection Time: 04/13/24 10:17 AM   Specimen: Nasal Mucosa; Nasal Swab  Result Value Ref Range Status   MRSA, PCR NEGATIVE NEGATIVE Final   Staphylococcus aureus NEGATIVE NEGATIVE Final    Comment: (NOTE) The Xpert SA Assay (FDA approved for NASAL specimens in patients 53 years of age and older), is one component of a comprehensive surveillance program. It is not intended to diagnose infection nor to guide or monitor treatment. Performed at [facility], [address], [place], [place] [address]      Labs: BNP (last 3 results) No results for input(s): "BNP" in the last 8760 hours. Basic Metabolic Panel: Recent Labs  Lab 04/13/24 0604 04/13/24 0744 04/14/24 0426 04/15/24 0457  NA 136  --  136 134*  K 3.3*  --  3.3* 3.8  CL 101  --  100 100  CO2 21*  --  26 26  GLUCOSE 92  --  122* 107*  BUN 18  --  20 13  CREATININE 0.86  --  1.06* 0.62  CALCIUM  9.2  --  8.2* 8.9  MG  --  1.4* 2.1  --    Liver Function Tests: No results for input(s): "AST", "ALT", "ALKPHOS", "BILITOT", "PROT", "ALBUMIN" in the last 168 hours. No results for input(s): "LIPASE", "AMYLASE" in the last  168 hours. No results for input(s): "AMMONIA" in the last 168 hours. CBC: Recent Labs  Lab 04/13/24 0604 04/14/24 0426 04/15/24 0457  WBC 9.6 6.4 7.9  NEUTROABS 7.5  --   --   HGB 14.1 11.6* 11.8*  HCT 40.5 35.1* 35.1*  MCV 99.5 104.2* 102.9*  PLT 210 153 157   Cardiac Enzymes: No results for input(s): "CKTOTAL", "CKMB", "CKMBINDEX", "TROPONINI" in the last 168 hours. BNP: Invalid input(s): "POCBNP" CBG: No results for input(s): "GLUCAP" in the last 168 hours. D-Dimer No results for input(s): "DDIMER" in the last 72 hours. Hgb A1c No results for input(s): "HGBA1C" in the last 72 hours. Lipid Profile No results for input(s): "CHOL", "HDL", "LDLCALC", "TRIG", "CHOLHDL", "LDLDIRECT" in the last 72 hours. Thyroid  function studies No results for input(s): "TSH", "T4TOTAL", "T3FREE", "THYROIDAB" in the last 72 hours.  Invalid input(s): "FREET3" Anemia work up No results for input(s): "VITAMINB12", "FOLATE", "FERRITIN", "TIBC", "IRON", "RETICCTPCT" in the last 72 hours. Urinalysis    Component Value Date/Time   COLORURINE YELLOW 07/11/2020 2212   APPEARANCEUR Clear 01/22/2022 1411   LABSPEC 1.008 07/11/2020 2212   PHURINE 6.0 07/11/2020 2212   GLUCOSEU Negative 01/22/2022 1411   HGBUR NEGATIVE 07/11/2020 2212   BILIRUBINUR Negative 01/22/2022 1411   KETONESUR NEGATIVE 07/11/2020 2212   PROTEINUR Negative 01/22/2022 1411   PROTEINUR NEGATIVE 07/11/2020 2212   UROBILINOGEN negative 04/11/2014 1713   NITRITE Negative 01/22/2022 1411   NITRITE NEGATIVE 07/11/2020 2212   LEUKOCYTESUR Negative 01/22/2022 1411   LEUKOCYTESUR NEGATIVE 07/11/2020 2212   Sepsis Labs Recent Labs  Lab 04/13/24 0604 04/14/24 0426 04/15/24 0457  WBC 9.6 6.4 7.9   Microbiology Recent Results (from the past 240 hours)  Surgical PCR screen     Status: None   Collection Time: 04/13/24 10:17 AM   Specimen: Nasal Mucosa; Nasal Swab  Result Value Ref Range Status   MRSA, PCR NEGATIVE NEGATIVE  Final   Staphylococcus aureus NEGATIVE NEGATIVE Final    Comment: (NOTE) The Xpert  SA Assay (FDA approved for NASAL specimens in patients 36 years of age and older), is one component of a comprehensive surveillance program. It is not intended to diagnose infection nor to guide or monitor treatment. Performed at [facility], [address], [place], [place] [address]    Time coordinating discharge:  41 mins   SIGNED:  Faustino Hook, MD  [facility] 04/16/2024, 2:14 PM How to contact the [facility] Attending or Consulting provider 7A - 7P or covering provider during after hours 7P -7A, for this patient?  Check the care team in [facility] and look for a) attending/consulting [facility] provider listed and b) the [facility] team listed Log into [email or web address] and use [employer] universal password to access. If you do not have the password, please contact the hospital operator. Locate the [facility] provider you are looking for under [facility] and page to a number that you can be directly reached. If you still have difficulty reaching the provider, please page the [facility] (Director on Call) for the Hospitalists listed on amion for assistance.

## 2024-04-16 NOTE — Progress Notes (Signed)
 Mobility Specialist Progress Note:    04/16/24 1410  Mobility  Activity Ambulated with assistance in room;Ambulated with assistance to bathroom;Transferred from bed to chair  Level of Assistance Minimal assist, patient does 75% or more  Assistive Device Front wheel walker  Distance Ambulated (ft) 35 ft  Range of Motion/Exercises Active;All extremities  LLE Weight Bearing Per Provider Order WBAT  Activity Response Tolerated well  Mobility Referral Yes  Mobility visit 1 Mobility  Mobility Specialist Start Time (ACUTE ONLY) 1330  Mobility Specialist Stop Time (ACUTE ONLY) 1350  Mobility Specialist Time Calculation (min) (ACUTE ONLY) 20 min   Pt received in bed agreeable to mobility. Required MinA to stand and CGA to ambulate with RW. Tolerated well, see O2 sats below. LLE WBAT. Left pt in chair, call bell in reach. All needs met.   SpO2 90% on RA at rest SpO2 85% on RA during ambulation SpO2 94% on 2L after ambulation  Glinda Lapping Mobility Specialist Please contact via SecureChat or  Rehab office at [phone]

## 2024-04-16 NOTE — Progress Notes (Signed)
 Mobility Specialist Progress Note:    04/16/24 1131  Mobility  Activity Transferred from chair to bed  Level of Assistance Minimal assist, patient does 75% or more  Assistive Device None  Distance Ambulated (ft) 5 ft  Range of Motion/Exercises Active;All extremities  LLE Weight Bearing Per Provider Order WBAT  Activity Response Tolerated well  Mobility Referral Yes  Mobility visit 1 Mobility  Mobility Specialist Start Time (ACUTE ONLY) 1110  Mobility Specialist Stop Time (ACUTE ONLY) 1131  Mobility Specialist Time Calculation (min) (ACUTE ONLY) 21 min   Pt received in chair requesting assistance to bed. Required MinA to stand and transfer with no AD. Tolerated well, LLE WBAT. Left pt supine, alarm on. All needs met.  Glinda Lapping Mobility Specialist Please contact via Special educational needs teacher or  Rehab office at [phone]

## 2024-04-17 DIAGNOSIS — R739 Hyperglycemia, unspecified: Secondary | ICD-10-CM | POA: Diagnosis not present

## 2024-04-17 DIAGNOSIS — Z96698 Presence of other orthopedic joint implants: Secondary | ICD-10-CM | POA: Diagnosis not present

## 2024-04-17 DIAGNOSIS — M6281 Muscle weakness (generalized): Secondary | ICD-10-CM | POA: Diagnosis not present

## 2024-04-17 DIAGNOSIS — E876 Hypokalemia: Secondary | ICD-10-CM | POA: Diagnosis not present

## 2024-04-17 DIAGNOSIS — D519 Vitamin B12 deficiency anemia, unspecified: Secondary | ICD-10-CM | POA: Diagnosis not present

## 2024-04-17 DIAGNOSIS — K5900 Constipation, unspecified: Secondary | ICD-10-CM | POA: Diagnosis not present

## 2024-04-17 DIAGNOSIS — R2689 Other abnormalities of gait and mobility: Secondary | ICD-10-CM | POA: Diagnosis not present

## 2024-04-17 DIAGNOSIS — I10 Essential (primary) hypertension: Secondary | ICD-10-CM | POA: Diagnosis not present

## 2024-04-17 DIAGNOSIS — E559 Vitamin D deficiency, unspecified: Secondary | ICD-10-CM | POA: Diagnosis not present

## 2024-04-17 DIAGNOSIS — R278 Other lack of coordination: Secondary | ICD-10-CM | POA: Diagnosis not present

## 2024-04-17 DIAGNOSIS — D649 Anemia, unspecified: Secondary | ICD-10-CM | POA: Diagnosis not present

## 2024-04-17 DIAGNOSIS — Z7982 Long term (current) use of aspirin: Secondary | ICD-10-CM | POA: Diagnosis not present

## 2024-04-17 DIAGNOSIS — S72002A Fracture of unspecified part of neck of left femur, initial encounter for closed fracture: Secondary | ICD-10-CM | POA: Diagnosis not present

## 2024-04-17 DIAGNOSIS — S7292XA Unspecified fracture of left femur, initial encounter for closed fracture: Secondary | ICD-10-CM | POA: Diagnosis not present

## 2024-04-17 DIAGNOSIS — E538 Deficiency of other specified B group vitamins: Secondary | ICD-10-CM | POA: Diagnosis not present

## 2024-04-17 DIAGNOSIS — E785 Hyperlipidemia, unspecified: Secondary | ICD-10-CM | POA: Diagnosis not present

## 2024-04-17 DIAGNOSIS — Z7401 Bed confinement status: Secondary | ICD-10-CM | POA: Diagnosis not present

## 2024-04-17 DIAGNOSIS — Z792 Long term (current) use of antibiotics: Secondary | ICD-10-CM | POA: Diagnosis not present

## 2024-04-17 DIAGNOSIS — E782 Mixed hyperlipidemia: Secondary | ICD-10-CM | POA: Diagnosis not present

## 2024-04-17 DIAGNOSIS — Z720 Tobacco use: Secondary | ICD-10-CM | POA: Diagnosis not present

## 2024-04-17 DIAGNOSIS — E7800 Pure hypercholesterolemia, unspecified: Secondary | ICD-10-CM | POA: Diagnosis not present

## 2024-04-17 DIAGNOSIS — Z79899 Other long term (current) drug therapy: Secondary | ICD-10-CM | POA: Diagnosis not present

## 2024-04-17 DIAGNOSIS — Z741 Need for assistance with personal care: Secondary | ICD-10-CM | POA: Diagnosis not present

## 2024-04-17 DIAGNOSIS — D509 Iron deficiency anemia, unspecified: Secondary | ICD-10-CM | POA: Diagnosis not present

## 2024-04-17 DIAGNOSIS — Z860100 Personal history of colon polyps, unspecified: Secondary | ICD-10-CM | POA: Diagnosis not present

## 2024-04-17 DIAGNOSIS — S72002D Fracture of unspecified part of neck of left femur, subsequent encounter for closed fracture with routine healing: Secondary | ICD-10-CM | POA: Diagnosis not present

## 2024-04-17 DIAGNOSIS — R0602 Shortness of breath: Secondary | ICD-10-CM | POA: Diagnosis not present

## 2024-04-17 DIAGNOSIS — K219 Gastro-esophageal reflux disease without esophagitis: Secondary | ICD-10-CM | POA: Diagnosis not present

## 2024-04-17 DIAGNOSIS — M80852D Other osteoporosis with current pathological fracture, left femur, subsequent encounter for fracture with routine healing: Secondary | ICD-10-CM | POA: Diagnosis not present

## 2024-04-17 DIAGNOSIS — I700 Atherosclerosis of aorta: Secondary | ICD-10-CM | POA: Diagnosis not present

## 2024-04-17 DIAGNOSIS — S72302D Unspecified fracture of shaft of left femur, subsequent encounter for closed fracture with routine healing: Secondary | ICD-10-CM | POA: Diagnosis not present

## 2024-04-17 NOTE — Plan of Care (Signed)
   Problem: Health Behavior/Discharge Planning: Goal: Ability to manage health-related needs will improve Outcome: Progressing

## 2024-04-18 DIAGNOSIS — S72002A Fracture of unspecified part of neck of left femur, initial encounter for closed fracture: Secondary | ICD-10-CM | POA: Diagnosis not present

## 2024-04-18 DIAGNOSIS — I10 Essential (primary) hypertension: Secondary | ICD-10-CM | POA: Diagnosis not present

## 2024-04-20 DIAGNOSIS — S7292XA Unspecified fracture of left femur, initial encounter for closed fracture: Secondary | ICD-10-CM | POA: Diagnosis not present

## 2024-04-20 DIAGNOSIS — E538 Deficiency of other specified B group vitamins: Secondary | ICD-10-CM | POA: Diagnosis not present

## 2024-04-23 ENCOUNTER — Telehealth: Payer: Self-pay | Admitting: Orthopedic Surgery

## 2024-04-23 DIAGNOSIS — D519 Vitamin B12 deficiency anemia, unspecified: Secondary | ICD-10-CM | POA: Diagnosis not present

## 2024-04-23 DIAGNOSIS — Z741 Need for assistance with personal care: Secondary | ICD-10-CM | POA: Diagnosis not present

## 2024-04-23 DIAGNOSIS — Z792 Long term (current) use of antibiotics: Secondary | ICD-10-CM | POA: Diagnosis not present

## 2024-04-23 NOTE — Telephone Encounter (Signed)
 Dr. Delfino Fellers pt - spoke w/Miranda w/[facility] [phone] - requesting orders to remove staples, DOS 04/13/24, would like that faxed to [phone].

## 2024-04-24 DIAGNOSIS — Z79899 Other long term (current) drug therapy: Secondary | ICD-10-CM | POA: Diagnosis not present

## 2024-04-24 NOTE — Telephone Encounter (Signed)
 Spoke w/Miranda at [facility], she verbalized understanding.

## 2024-04-25 DIAGNOSIS — Z96698 Presence of other orthopedic joint implants: Secondary | ICD-10-CM | POA: Diagnosis not present

## 2024-04-25 DIAGNOSIS — I10 Essential (primary) hypertension: Secondary | ICD-10-CM | POA: Diagnosis not present

## 2024-04-25 DIAGNOSIS — S72302D Unspecified fracture of shaft of left femur, subsequent encounter for closed fracture with routine healing: Secondary | ICD-10-CM | POA: Diagnosis not present

## 2024-04-25 DIAGNOSIS — E7800 Pure hypercholesterolemia, unspecified: Secondary | ICD-10-CM | POA: Diagnosis not present

## 2024-04-26 DIAGNOSIS — I10 Essential (primary) hypertension: Secondary | ICD-10-CM | POA: Diagnosis not present

## 2024-04-26 DIAGNOSIS — E782 Mixed hyperlipidemia: Secondary | ICD-10-CM | POA: Diagnosis not present

## 2024-04-26 DIAGNOSIS — S72302D Unspecified fracture of shaft of left femur, subsequent encounter for closed fracture with routine healing: Secondary | ICD-10-CM | POA: Diagnosis not present

## 2024-04-27 ENCOUNTER — Encounter: Admitting: Orthopedic Surgery

## 2024-05-01 ENCOUNTER — Telehealth: Payer: Self-pay

## 2024-05-01 NOTE — Transitions of Care (Post Inpatient/ED Visit) (Signed)
 05/01/2024  Name: Jody Wilson MRN: [id number] DOB: 04-24-1951  Today's TOC FU Call Status: Today's TOC FU Call Status:: Successful TOC FU Call Completed TOC FU Call Complete Date: 05/01/24 Patient's Name and Date of Birth confirmed.  Transition Care Management Follow-up Telephone Call Date of Discharge: 04/30/24 Discharge Facility: Other [facility]) Name of Other ([facility]) Discharge Facility: [facility] Type of Discharge: Inpatient Admission How have you been since you were released from the hospital?: Better Any questions or concerns?: No  Items Reviewed: Did you receive and understand the discharge instructions provided?: Yes Medications obtained,verified, and reconciled?: Yes (Medications Reviewed) Any new allergies since your discharge?: No Dietary orders reviewed?: Yes Do you have support at home?: Yes People in Home [RPT]: spouse  Medications Reviewed Today: Medications Reviewed Today     Reviewed by Darrall Ellison, LPN (Licensed Practical Nurse) on 05/01/24 at 1050  Med List Status: <None>   Medication Order Taking? Sig Documenting Provider Last Dose Status Informant  ALPRAZolam  (XANAX ) 0.5 MG tablet [id number]  Take 1 tablet (0.5 mg total) by mouth 2 (two) times daily as needed for anxiety. Rayfield Cairo, MD  Active   aspirin  EC 81 MG tablet [id number]  TAKE 1 PO BID THRU 05/13/24 THEN RESUME 1 PO DAILY Johnson, Clanford L, MD  Active   buPROPion  (WELLBUTRIN  XL) 150 MG 24 hr tablet [id number] No Take 1 tablet (150 mg total) by mouth daily. Dettinger, Lucio Sabin, MD 04/13/2024 Active Self, Pharmacy Records  calcium -vitamin D  (OSCAL WITH D) 500-200 MG-UNIT tablet [id number] No Take 1 tablet by mouth daily with lunch.  [provider] Taking Active Self, Pharmacy Records           Med Note Verle Gleason Jul 28, 2020  7:58 AM)    citalopram  (CELEXA ) 40 MG tablet [id number] No Take 1 tablet (40 mg total) by mouth daily. Dettinger, Lucio Sabin, MD 04/12/2024 Morning Active Self, Pharmacy Records  cyanocobalamin  (VITAMIN B12) 1000 MCG/ML injection [id number] No INJECT 1 ML IM EVERY 28 DAYS Paulett Boros, MD Taking Active Self, Pharmacy Records  docusate sodium  (COLACE) 100 MG capsule [id number]  Take 1 capsule (100 mg total) by mouth 2 (two) times daily. Johnson, Clanford L, MD  Active   ergocalciferol  (VITAMIN D2) 1.25 MG (50000 UT) capsule [id number] No Take 1 capsule (50,000 Units total) by mouth every 14 (fourteen) days. Sonnie Dusky, PA-C Taking Active Self, Pharmacy Records  ezetimibe  (ZETIA ) 10 MG tablet [id number] No Take 1 tablet (10 mg total) by mouth daily. Dettinger, Lucio Sabin, MD 04/12/2024 Morning Active Self, Pharmacy Records  feeding supplement (ENSURE ENLIVE / ENSURE PLUS) LIQD [id number]  Take 237 mLs by mouth 2 (two) times daily between meals. Johnson, Clanford L, MD  Active   folic acid  (FOLVITE ) 1 MG tablet [id number] No Take 1 mg by mouth every other day. [provider] Past Week Active Self, Pharmacy Records  hydrochlorothiazide  (MICROZIDE ) 12.5 MG capsule [id number] No Take 1 capsule (12.5 mg total) by mouth daily. Vicky Grange M, DO 04/12/2024 Morning Active Self, Pharmacy Records  Multiple Vitamin (MULTIVITAMIN WITH MINERALS) TABS tablet [id number]  Take 1 tablet by mouth daily. Johnson, Clanford L, MD  Active   olmesartan  (BENICAR ) 20 MG tablet [id number] No Take 1 tablet (20 mg total) by mouth every evening. Tolia, Sunit, DO Past Week Active Self, Pharmacy Records  pantoprazole  (PROTONIX ) 40 MG tablet [id number] No Take 1 tablet (40 mg total) by mouth daily. Vicky Grange M,  DO 04/13/2024 Active Self, Pharmacy Records  polyethylene glycol (MIRALAX  / GLYCOLAX ) 17 g packet [id number]  Take 17 g by mouth daily as needed for mild constipation. Rayfield Cairo, MD  Active   pravastatin  (PRAVACHOL ) 40 MG tablet [id number] No Take 1 tablet (40 mg total) by mouth every evening. Eliodoro Guerin, DO  Past Week Active Self, Pharmacy Records            Home Care and Equipment/Supplies: Were Home Health Services Ordered?: NA Any new equipment or medical supplies ordered?: NA  Functional Questionnaire: Do you need assistance with bathing/showering or dressing?: No Do you need assistance with meal preparation?: No Do you need assistance with eating?: No Do you have difficulty maintaining continence: No Do you need assistance with getting out of bed/getting out of a chair/moving?: No Do you have difficulty managing or taking your medications?: No  Follow up appointments reviewed: PCP Follow-up appointment confirmed?: Yes Date of PCP follow-up appointment?: 05/08/24 Follow-up Provider: [facility] Hospital Follow-up appointment confirmed?: Yes Date of Specialist follow-up appointment?: 05/17/24 Follow-Up Specialty Provider:: surgeon Do you need transportation to your follow-up appointment?: No Do you understand care options if your condition(s) worsen?: Yes-patient verbalized understanding    SIGNATURE Darrall Ellison, LPN [facility] Nurse Health Advisor Direct Dial [phone]

## 2024-05-07 NOTE — Progress Notes (Signed)
 Established Patient Office Visit  Subjective  Patient ID: Jody Wilson, female    DOB: [birth year]  Age: 73 y.o. MRN: [id number]  Chief Complaint  Patient presents with   Hospitalization Follow-up    Had hip surgery on 5/1, went to rehab after came home a week ago    HPI Jody Wilson is a 73 year old female presenting on 05/08/2024 for hospital discharge follow-up due to concerns of low blood pressure, fecal incontinence, and constipation. The patient recently underwent hip surgery and was discharged home yesterday after a 13-day rehabilitation stay at [facility]. She is scheduled to follow up with her surgical team on 05/17/2024.  While in the rehabilitation center, she was prescribed Losartan 50 mg twice daily and hydrochlorothiazide  12.5 mg daily for blood pressure management. Since returning home, she has resumed her previous outpatient blood pressure regimen of Benicar  (olmesartan ) 20 mg daily. She reports her blood pressure has been running low at home. Due to her history of anemia, hemoglobin levels will be checked.  The patient also reports bowel incontinence accompanied by constipation. She states her last bowel movement was over one week ago, and she has had multiple episodes of fecal incontinence. She describes incidents where she goes to the bathroom to urinate and notices stool-staining in her underwear without the sensation of needing to defecate. She reports early satiety and pain after eating just a few bites, expressing concern for a possible bowel obstruction.  Additionally, she reports frequent burping despite minimal food intake. She has a known history of GERD and hiatal hernia, currently managed with pantoprazole  40 mg daily.  Patient Active Problem List   Diagnosis Date Noted   Incontinence of feces 05/08/2024   Closed fracture of neck of left femur (HCC) 04/13/2024   Hypokalemia 04/13/2024   Closed right hip fracture (HCC)  02/09/2023   Pulmonary nodule 02/09/2023   Hypertriglyceridemia 01/22/2022   Abnormal findings on diagnostic imaging of liver and biliary tract 12/09/2021   Diarrhea 12/09/2021   History of colonic polyps 12/09/2021   Anxiety and depression 06/23/2020   Hiatal hernia 06/23/2020   Thoracic aortic atherosclerosis (HCC) 06/23/2020   Tobacco abuse 04/25/2020   Involuntary muscle jerks while sleeping 09/26/2019   B12 deficiency 09/25/2019   Iron deficiency anemia 09/25/2019   Elevated liver function tests 09/07/2019   Osteoporosis 02/09/2017   History of vertebral fracture 02/09/2017   GERD (gastroesophageal reflux disease) 03/11/2016   Hyperlipidemia with target LDL less than 100 02/19/2015   Hypertension 03/18/2014   Past Medical History:  Diagnosis Date   Anemia    Anxiety    Aortic atherosclerosis (HCC)    Carotid artery disease (HCC)    Depression    GERD (gastroesophageal reflux disease)    Hyperlipidemia    Hypertension    Orthostatic hypotension    Osteoporosis    Past Surgical History:  Procedure Laterality Date   ABDOMINAL HYSTERECTOMY     BACK SURGERY     CHOLECYSTECTOMY     FOOT SURGERY     7 in the past   HIP ARTHROPLASTY Right 02/11/2023   Procedure: ARTHROPLASTY BIPOLAR HIP (HEMIARTHROPLASTY);  Surgeon: Tonita Frater, MD;  Location: [facility];  Service: Orthopedics;  Laterality: Right;   HIP ARTHROPLASTY Left 04/13/2024   Procedure: HEMIARTHROPLASTY (BIPOLAR) HIP, DIRECT LATERAL  FOR FRACTURE;  Surgeon: Darrin Emerald, MD;  Location: [facility];  Service: Orthopedics;  Laterality: Left;   IR GENERIC HISTORICAL  12/09/2016  IR RADIOLOGIST EVAL & MGMT 12/09/2016 [facility]   IR GENERIC HISTORICAL  12/10/2016   IR VERTEBROPLASTY CERV/THOR BX INC UNI/BIL INC/INJECT/IMAGING 12/10/2016 [facility]   LEFT HEART CATH AND CORONARY ANGIOGRAPHY N/A 09/30/2020   Procedure: LEFT HEART CATH AND CORONARY ANGIOGRAPHY;  Surgeon: Cody Das, MD;  Location: [facility]  INVASIVE CV LAB;  Service: Cardiovascular;  Laterality: N/A;   TRACHEOSTOMY TUBE PLACEMENT  1982   Social History   Tobacco Use   Smoking status: Former    Current packs/day: 0.00    Average packs/day: 0.3 packs/day for 25.0 years (6.3 ttl pk-yrs)    Types: Cigarettes    Start date: 02/1998    Quit date: 02/2023    Years since quitting: 1.2    Passive exposure: Never   Smokeless tobacco: Never   Tobacco comments:    Trying to cut on number of cigarettes she smokes.  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  Tobacco Use   Smoking status: Former    Current packs/day: 0.00    Average packs/day: 0.3 packs/day for 25.0 years (6.3 ttl pk-yrs)    Types: Cigarettes    Start date: 02/1998    Quit date: 02/2023    Years since quitting: 1.2    Passive exposure: Never   Smokeless tobacco: Never   Tobacco comments:    Trying to cut on number of cigarettes she smokes.  Vaping Use   Vaping status: Never Used  Substance and Sexual Activity   Alcohol use: Not Currently    Comment: occassionally    Drug use: No   Sexual activity: Not on file  Other Topics Concern   Not on file  Social History Narrative   Lives with her husband, Kurt Phi, and has two grown sons and two grand sons.   She visits family and has a once weekly outing with a friend for socializing.  She enjoys making beaded jewelry, reading and walking.   Social Drivers of Corporate investment banker Strain: Low Risk  (12/27/2023)   Overall Financial Resource Strain  (CARDIA)    Difficulty of Paying Living Expenses: Not hard at all  Food Insecurity: No Food Insecurity (04/13/2024)   Hunger Vital Sign    Worried About Running Out of Food in the Last Year: Never true    Ran Out of Food in the Last Year: Never true  Transportation Needs: No Transportation Needs (04/13/2024)   PRAPARE - Administrator, Civil Service (Medical): No    Lack of Transportation (Non-Medical): No  Physical Activity: Inactive (12/27/2023)   Exercise Vital Sign    Days of Exercise per Week: 0 days    Minutes of Exercise per Session: 0 min  Stress: No Stress Concern Present (12/27/2023)   [employer] of Occupational Health - Occupational Stress Questionnaire    Feeling of Stress : Not at all  Social Connections: Socially Integrated (04/13/2024)   Social Connection and Isolation Panel [NHANES]    Frequency  of Communication with Friends and Family: More than three times a week    Frequency of Social Gatherings with Friends and Family: More than three times a week    Attends Religious Services: More than 4 times per year    Active Member of [employer] or Organizations: Yes    Attends Engineer, structural: More than 4 times per year    Marital Status: Married  Catering manager Violence: Not At Risk (04/13/2024)   Humiliation, Afraid, Rape, and Kick questionnaire    Fear of Current or Ex-Partner: No    Emotionally Abused: No    Physically Abused: No    Sexually Abused: No   Family Status  Relation Name Status   Mother  Deceased at age 75   Father  Deceased       Does not know biological father, thinks he is still living    Daughter  Deceased       Died at 38 hours old d/t kidney infection   Pat Uncle  Deceased   MGM  Deceased   MGF  Deceased   PGM  Deceased   PGF  Deceased   Son  Alive   Son  Alive   H Sister Debra Alive   H Sister  Alive   H Sister  Alive   H Sister  Alive   H Sister  Alive   Niece  Alive  No partnership data on file   Family  History  Problem Relation Age of Onset   Heart disease Mother    Hypertension Mother    Hyperlipidemia Mother    Diabetes Mother    Congestive Heart Failure Mother    Aneurysm Paternal Uncle    Heart attack Maternal Grandmother    Heart attack Maternal Grandfather    Hypertension Son    Other Son 1       Kienbock's Disease   Liver disease Son 68       Gilbert's Disease   Lupus Half-Sister    Atrial fibrillation Half-Sister    Breast cancer Niece    Allergies  Allergen Reactions   Tetracyclines & Related Itching and Swelling      Review of Systems  Constitutional:  Negative for chills and fever.  Respiratory:  Negative for cough, shortness of breath and wheezing.   Cardiovascular:  Negative for chest pain and leg swelling.  Gastrointestinal:  Positive for abdominal pain, constipation and heartburn.  Skin:  Negative for itching and rash.  Neurological:  Negative for dizziness and headaches.  Psychiatric/Behavioral:  Negative for suicidal ideas. The patient does not have insomnia.    Negative unless indicated in HPI   Objective:     BP (!) 86/58   Pulse 68   Temp (!) 97.5 F (36.4 C) (Temporal)   Ht 5\' 5"  (1.651 m)   Wt 135 lb 12.8 oz (61.6 kg)   SpO2 94%   BMI 22.60 kg/m  BP Readings from Last 3 Encounters:  05/08/24 93/65  05/08/24 (!) 86/58  04/17/24 138/85   Wt Readings from Last 3 Encounters:  05/08/24 135 lb (61.2 kg)  05/08/24 135 lb 12.8 oz (61.6 kg)  04/13/24 142 lb (64.4 kg)      Physical Exam Vitals and nursing note reviewed.  Constitutional:      General: She is not in acute distress.    Appearance: Normal appearance.  HENT:     Head: Normocephalic and atraumatic.     Nose: Nose normal.  Eyes:  General: No scleral icterus.    Extraocular Movements: Extraocular movements intact.     Conjunctiva/sclera: Conjunctivae normal.     Pupils: Pupils are equal, round, and reactive to light.  Cardiovascular:     Heart sounds: Normal heart  sounds.  Pulmonary:     Effort: Pulmonary effort is normal.     Breath sounds: Normal breath sounds.  Abdominal:     General: Bowel sounds are increased.     Tenderness: There is abdominal tenderness. There is guarding and rebound.  Musculoskeletal:        General: Normal range of motion.     Right lower leg: No edema.     Left lower leg: No edema.  Skin:    General: Skin is warm and dry.     Findings: No rash.  Neurological:     Mental Status: She is alert and oriented to person, place, and time.  Psychiatric:        Mood and Affect: Mood normal.        Behavior: Behavior normal.        Thought Content: Thought content normal.        Judgment: Judgment normal.      No results found for any visits on 05/08/24.  Last CBC Lab Results  Component Value Date   WBC 7.9 04/15/2024   HGB 11.8 (L) 04/15/2024   HCT 35.1 (L) 04/15/2024   MCV 102.9 (H) 04/15/2024   MCH 34.6 (H) 04/15/2024   RDW 12.4 04/15/2024   PLT 157 04/15/2024   Last metabolic panel Lab Results  Component Value Date   GLUCOSE 107 (H) 04/15/2024   NA 134 (L) 04/15/2024   K 3.8 04/15/2024   CL 100 04/15/2024   CO2 26 04/15/2024   BUN 13 04/15/2024   CREATININE 0.62 04/15/2024   GFRNONAA >60 04/15/2024   CALCIUM  8.9 04/15/2024   PHOS 2.9 02/12/2023   PROT 6.3 06/30/2023   ALBUMIN 3.9 06/30/2023   LABGLOB 2.4 06/30/2023   AGRATIO 1.7 03/31/2023   BILITOT 1.2 06/30/2023   ALKPHOS 92 06/30/2023   AST 20 06/30/2023   ALT 10 06/30/2023   ANIONGAP 8 04/15/2024   Last lipids Lab Results  Component Value Date   CHOL 176 12/30/2022   HDL 94 12/30/2022   LDLCALC 66 12/30/2022   LDLDIRECT 83 07/07/2021   TRIG 90 12/30/2022   CHOLHDL 1.9 12/30/2022    Last thyroid  functions Lab Results  Component Value Date   TSH 1.060 04/14/2021   T4TOTAL 7.9 04/14/2021        Assessment & Plan:  Closed fracture of neck of left femur with routine healing, subsequent encounter  Incontinence of feces,  unspecified fecal incontinence type -     DG Abd 1 View  Constipation, unspecified constipation type -     DG Abd 1 View  Gastroesophageal reflux disease without esophagitis  Iron deficiency anemia, unspecified iron deficiency anemia type -     Hemoglobin, fingerstick -     Anemia Profile B -     Ferrous Sulfate ; Take 1 tablet (325 mg total) by mouth daily with breakfast.  Dispense: 90 tablet; Refill: 0  Primary hypertension -     Olmesartan  Medoxomil; Take 0.5 tablets (10 mg total) by mouth daily.  Dispense: 30 tablet; Refill: 0  Hiatal hernia   Jody Wilson is a 73 year old Caucasian female seen today for hospital discharge follow-up  Bowel incontinence/constipation: KUB order, awaiting final reports from radiologist.    HTN: D/C  Losartan 50 mg BID, HCT 12.5 daily ; decrease benicar  20 mg 1/2 tab daily, check BP before taking medications hold for BP less 100/60  Anemia: POC hemoglobin 12; start Ferrous Sulfate  325 mg daily, take with vit to increase absorption  GERD/Hiatal Hernia: increase pantoprazole  to BID until next f/u with PCP in 2-weeks  Based on KUB finds, Jody Wilson was instructed to go to [facility] ED to r/o possible bowel obstruction.  The above assessment and management plan was discussed with the patient. The patient verbalized understanding of and has agreed to the management plan. Patient is aware to call the clinic if they develop any new symptoms or if symptoms persist or worsen. Patient is aware when to return to the clinic for a follow-up visit. Patient educated on when it is appropriate to go to the emergency department.  Return for with PCP in 2-weeks.    Norm Wray [place] Thompson, DNP [facility] [address] [place], [place] [address] [phone]    Note: This document was prepared by [employer] voice dictation technology and any errors that results from this process are unintentional.

## 2024-05-08 ENCOUNTER — Ambulatory Visit: Payer: Self-pay | Admitting: Nurse Practitioner

## 2024-05-08 ENCOUNTER — Emergency Department (HOSPITAL_COMMUNITY)
Admission: EM | Admit: 2024-05-08 | Discharge: 2024-05-08 | Disposition: A | Discharge status: Home / Self Care | Attending: Emergency Medicine | Admitting: Emergency Medicine

## 2024-05-08 ENCOUNTER — Ambulatory Visit (INDEPENDENT_AMBULATORY_CARE_PROVIDER_SITE_OTHER): Admitting: Nurse Practitioner

## 2024-05-08 ENCOUNTER — Other Ambulatory Visit: Payer: Self-pay

## 2024-05-08 ENCOUNTER — Encounter: Payer: Self-pay | Admitting: Nurse Practitioner

## 2024-05-08 ENCOUNTER — Ambulatory Visit (INDEPENDENT_AMBULATORY_CARE_PROVIDER_SITE_OTHER)

## 2024-05-08 ENCOUNTER — Encounter (HOSPITAL_COMMUNITY): Payer: Self-pay | Admitting: *Deleted

## 2024-05-08 ENCOUNTER — Emergency Department (HOSPITAL_COMMUNITY)

## 2024-05-08 VITALS — BP 86/58 | HR 68 | Temp 97.5°F | Ht 65.0 in | Wt 135.8 lb

## 2024-05-08 DIAGNOSIS — K449 Diaphragmatic hernia without obstruction or gangrene: Secondary | ICD-10-CM

## 2024-05-08 DIAGNOSIS — D509 Iron deficiency anemia, unspecified: Secondary | ICD-10-CM

## 2024-05-08 DIAGNOSIS — I10 Essential (primary) hypertension: Secondary | ICD-10-CM

## 2024-05-08 DIAGNOSIS — S72002D Fracture of unspecified part of neck of left femur, subsequent encounter for closed fracture with routine healing: Secondary | ICD-10-CM

## 2024-05-08 DIAGNOSIS — R159 Full incontinence of feces: Secondary | ICD-10-CM | POA: Diagnosis not present

## 2024-05-08 DIAGNOSIS — Z79899 Other long term (current) drug therapy: Secondary | ICD-10-CM | POA: Insufficient documentation

## 2024-05-08 DIAGNOSIS — K5900 Constipation, unspecified: Secondary | ICD-10-CM

## 2024-05-08 DIAGNOSIS — R1013 Epigastric pain: Secondary | ICD-10-CM | POA: Diagnosis not present

## 2024-05-08 DIAGNOSIS — I2510 Atherosclerotic heart disease of native coronary artery without angina pectoris: Secondary | ICD-10-CM | POA: Diagnosis not present

## 2024-05-08 DIAGNOSIS — K5730 Diverticulosis of large intestine without perforation or abscess without bleeding: Secondary | ICD-10-CM | POA: Diagnosis not present

## 2024-05-08 DIAGNOSIS — K8689 Other specified diseases of pancreas: Secondary | ICD-10-CM | POA: Diagnosis not present

## 2024-05-08 DIAGNOSIS — K219 Gastro-esophageal reflux disease without esophagitis: Secondary | ICD-10-CM | POA: Diagnosis not present

## 2024-05-08 DIAGNOSIS — Z7982 Long term (current) use of aspirin: Secondary | ICD-10-CM | POA: Diagnosis not present

## 2024-05-08 DIAGNOSIS — N200 Calculus of kidney: Secondary | ICD-10-CM | POA: Diagnosis not present

## 2024-05-08 LAB — COMPREHENSIVE METABOLIC PANEL WITH GFR
ALT: 17 U/L (ref 0–44)
AST: 18 U/L (ref 15–41)
Albumin: 3.8 g/dL (ref 3.5–5.0)
Alkaline Phosphatase: 77 U/L (ref 38–126)
Anion gap: 10 (ref 5–15)
BUN: 25 mg/dL — ABNORMAL HIGH (ref 8–23)
CO2: 25 mmol/L (ref 22–32)
Calcium: 10 mg/dL (ref 8.9–10.3)
Chloride: 101 mmol/L (ref 98–111)
Creatinine, Ser: 1.56 mg/dL — ABNORMAL HIGH (ref 0.44–1.00)
GFR, Estimated: 35 mL/min — ABNORMAL LOW (ref >60)
Glucose, Bld: 94 mg/dL (ref 70–99)
Potassium: 3.9 mmol/L (ref 3.5–5.1)
Sodium: 136 mmol/L (ref 135–145)
Total Bilirubin: 0.7 mg/dL (ref 0.0–1.2)
Total Protein: 7.4 g/dL (ref 6.5–8.1)

## 2024-05-08 LAB — CBC
HCT: 41 % (ref 36.0–46.0)
Hemoglobin: 13.6 g/dL (ref 12.0–15.0)
MCH: 33.9 pg (ref 26.0–34.0)
MCHC: 33.2 g/dL (ref 30.0–36.0)
MCV: 102.2 fL — ABNORMAL HIGH (ref 80.0–100.0)
Platelets: 340 10*3/uL (ref 150–400)
RBC: 4.01 MIL/uL (ref 3.87–5.11)
RDW: 12 % (ref 11.5–15.5)
WBC: 8 10*3/uL (ref 4.0–10.5)
nRBC: 0 % (ref 0.0–0.2)

## 2024-05-08 LAB — LIPASE, BLOOD: Lipase: 28 U/L (ref 11–51)

## 2024-05-08 LAB — HEMOGLOBIN, FINGERSTICK: Hemoglobin: 12 g/dL (ref 11.1–15.9)

## 2024-05-08 MED ORDER — SODIUM CHLORIDE 0.9 % IV BOLUS: 1000.0000 mL | Freq: Once | INTRAVENOUS | Status: DC

## 2024-05-08 MED ORDER — ONDANSETRON 8 MG PO TBDP
8.0000 mg | ORAL_TABLET | Freq: Once | ORAL | Status: AC
  Administered 2024-05-08: 8 mg via ORAL
  Filled 2024-05-08: qty 1

## 2024-05-08 MED ORDER — FERROUS SULFATE 325 (65 FE) MG PO TABS: 325.0000 mg | ORAL_TABLET | Freq: Every day | ORAL | 0 refills | Status: DC

## 2024-05-08 MED ORDER — ONDANSETRON HCL 4 MG PO TABS: 4.0000 mg | ORAL_TABLET | Freq: Three times a day (TID) | ORAL | 0 refills | Status: AC | PRN

## 2024-05-08 MED ORDER — OLMESARTAN MEDOXOMIL 20 MG PO TABS: 10.0000 mg | ORAL_TABLET | Freq: Every day | ORAL | 0 refills | Status: DC

## 2024-05-08 NOTE — ED Notes (Signed)
 Pt ambulatory at DC with rolling walker, verbalizes DC teaching with no questions. Driven home by family member.

## 2024-05-08 NOTE — ED Provider Notes (Signed)
 [place] EMERGENCY DEPARTMENT AT [facility] Provider Note  CSN: [id number] Arrival date & time: 05/08/24 1211  Chief Complaint(s) Abdominal Pain  HPI Jody Wilson is a 73 y.o. female with history of GERD, hypertension, hyperlipidemia presenting to the emergency department with abdominal pain.  Patient reports the past week she has been having abdominal pain in the epigastric region, associated with nausea.  Whenever she tries to eat.  No fevers or chills.  Also reports that she has been feeling constipated.  Did have recent hip surgery and was on opiate medication but subsequently just taking ibuprofen.  Not taking any medicine for constipation.  Reports that she has been incontinent of some stool.  No trouble urinating.  No back pain.  Denies similar episode.   Past Medical History Past Medical History:  Diagnosis Date   Anemia    Anxiety    Aortic atherosclerosis (HCC)    Carotid artery disease (HCC)    Depression    GERD (gastroesophageal reflux disease)    Hyperlipidemia    Hypertension    Orthostatic hypotension    Osteoporosis    Patient Active Problem List   Diagnosis Date Noted   Incontinence of feces 05/08/2024   Closed fracture of neck of left femur (HCC) 04/13/2024   Hypokalemia 04/13/2024   Closed right hip fracture (HCC) 02/09/2023   Pulmonary nodule 02/09/2023   Hypertriglyceridemia 01/22/2022   Abnormal findings on diagnostic imaging of liver and biliary tract 12/09/2021   Diarrhea 12/09/2021   History of colonic polyps 12/09/2021   Anxiety and depression 06/23/2020   Hiatal hernia 06/23/2020   Thoracic aortic atherosclerosis (HCC) 06/23/2020   Tobacco abuse 04/25/2020   Involuntary muscle jerks while sleeping 09/26/2019   B12 deficiency 09/25/2019   Iron deficiency anemia 09/25/2019   Elevated liver function tests 09/07/2019   Osteoporosis 02/09/2017   History of vertebral fracture 02/09/2017   GERD (gastroesophageal reflux disease)  03/11/2016   Hyperlipidemia with target LDL less than 100 02/19/2015   Hypertension 03/18/2014   Home Medication(s) Prior to Admission medications   Medication Sig Start Date End Date Taking? Authorizing Provider  ondansetron  (ZOFRAN ) 4 MG tablet Take 1 tablet (4 mg total) by mouth every 8 (eight) hours as needed for nausea or vomiting. 05/08/24  Yes Mordecai Applebaum, MD  ALPRAZolam  (XANAX ) 0.5 MG tablet Take 1 tablet (0.5 mg total) by mouth 2 (two) times daily as needed for anxiety. 04/16/24   Rayfield Cairo, MD  aspirin  EC 81 MG tablet TAKE 1 PO BID THRU 05/13/24 THEN RESUME 1 PO DAILY 04/16/24   Johnson, Clanford L, MD  buPROPion  (WELLBUTRIN  XL) 150 MG 24 hr tablet Take 1 tablet (150 mg total) by mouth daily. 01/04/24   Dettinger, Lucio Sabin, MD  calcium -vitamin D  (OSCAL WITH D) 500-200 MG-UNIT tablet Take 1 tablet by mouth daily with lunch.     [provider]  citalopram  (CELEXA ) 40 MG tablet Take 1 tablet (40 mg total) by mouth daily. 01/04/24   Dettinger, Lucio Sabin, MD  cyanocobalamin  (VITAMIN B12) 1000 MCG/ML injection INJECT 1 ML IM EVERY 28 DAYS 10/31/23   Paulett Boros, MD  docusate sodium  (COLACE) 100 MG capsule Take 1 capsule (100 mg total) by mouth 2 (two) times daily. 04/16/24   Johnson, Clanford L, MD  ergocalciferol  (VITAMIN D2) 1.25 MG (50000 UT) capsule Take 1 capsule (50,000 Units total) by mouth every 14 (fourteen) days. 07/07/23   Sonnie Dusky, PA-C  ezetimibe  (ZETIA ) 10 MG  tablet Take 1 tablet (10 mg total) by mouth daily. 01/04/24   Dettinger, Lucio Sabin, MD  feeding supplement (ENSURE ENLIVE / ENSURE PLUS) LIQD Take 237 mLs by mouth 2 (two) times daily between meals. 04/17/24   Rayfield Cairo, MD  ferrous sulfate  325 (65 FE) MG tablet Take 1 tablet (325 mg total) by mouth daily with breakfast. 05/08/24   Villa Greaser, Adell Hones, NP  folic acid  (FOLVITE ) 1 MG tablet Take 1 mg by mouth every other day.    [provider]  Multiple Vitamin  (MULTIVITAMIN WITH MINERALS) TABS tablet Take 1 tablet by mouth daily. 04/17/24   Johnson, Clanford L, MD  olmesartan  (BENICAR ) 20 MG tablet Take 0.5 tablets (10 mg total) by mouth daily. 05/08/24   St Annice Kim, NP  pantoprazole  (PROTONIX ) 40 MG tablet Take 1 tablet (40 mg total) by mouth daily. 03/01/23   Eliodoro Guerin, DO  polyethylene glycol (MIRALAX  / GLYCOLAX ) 17 g packet Take 17 g by mouth daily as needed for mild constipation. 04/16/24   Johnson, Clanford L, MD  pravastatin  (PRAVACHOL ) 40 MG tablet Take 1 tablet (40 mg total) by mouth every evening. 03/01/23   Eliodoro Guerin, DO                                                                                                                                    Past Surgical History Past Surgical History:  Procedure Laterality Date   ABDOMINAL HYSTERECTOMY     BACK SURGERY     CHOLECYSTECTOMY     FOOT SURGERY     7 in the past   HIP ARTHROPLASTY Right 02/11/2023   Procedure: ARTHROPLASTY BIPOLAR HIP (HEMIARTHROPLASTY);  Surgeon: Tonita Frater, MD;  Location: [facility];  Service: Orthopedics;  Laterality: Right;   HIP ARTHROPLASTY Left 04/13/2024   Procedure: HEMIARTHROPLASTY (BIPOLAR) HIP, DIRECT LATERAL  FOR FRACTURE;  Surgeon: Darrin Emerald, MD;  Location: [facility];  Service: Orthopedics;  Laterality: Left;   IR GENERIC HISTORICAL  12/09/2016   IR RADIOLOGIST EVAL & MGMT 12/09/2016 [facility]   IR GENERIC HISTORICAL  12/10/2016   IR VERTEBROPLASTY CERV/THOR BX INC UNI/BIL INC/INJECT/IMAGING 12/10/2016 [facility]   LEFT HEART CATH AND CORONARY ANGIOGRAPHY N/A 09/30/2020   Procedure: LEFT HEART CATH AND CORONARY ANGIOGRAPHY;  Surgeon: Cody Das, MD;  Location: [facility];  Service: Cardiovascular;  Laterality: N/A;   TRACHEOSTOMY TUBE PLACEMENT  1982   Family History Family History  Problem Relation Age of Onset   Heart disease Mother    Hypertension Mother    Hyperlipidemia Mother     Diabetes Mother    Congestive Heart Failure Mother    Aneurysm Paternal Uncle    Heart attack Maternal Grandmother    Heart attack Maternal Grandfather    Hypertension Son    Other Son 48       Kienbock's Disease  Liver disease Son 51       Gilbert's Disease   Lupus Half-Sister    Atrial fibrillation Half-Sister    Breast cancer Niece     Social History Social History   Tobacco Use   Smoking status: Former    Current packs/day: 0.00    Average packs/day: 0.3 packs/day for 25.0 years (6.3 ttl pk-yrs)    Types: Cigarettes    Start date: 02/1998    Quit date: 02/2023    Years since quitting: 1.2    Passive exposure: Never   Smokeless tobacco: Never   Tobacco comments:    Trying to cut on number of cigarettes she smokes.  Vaping Use   Vaping status: Never Used  Substance Use Topics   Alcohol use: Not Currently    Comment: occassionally    Drug use: No   Allergies Tetracyclines & related  Review of Systems Review of Systems  All other systems reviewed and are negative.   Physical Exam Vital Signs  I have reviewed the triage vital signs BP 125/63 (BP Location: Right Arm)   Pulse 70   Temp 98.2 F (36.8 C) (Oral)   Resp 16   Ht 5\' 5"  (1.651 m)   Wt 61.2 kg   SpO2 100%   BMI 22.47 kg/m  Physical Exam Vitals and nursing note reviewed.  Constitutional:      General: She is not in acute distress.    Appearance: She is well-developed.  HENT:     Head: Normocephalic and atraumatic.     Mouth/Throat:     Mouth: Mucous membranes are moist.  Eyes:     Pupils: Pupils are equal, round, and reactive to light.  Cardiovascular:     Rate and Rhythm: Normal rate and regular rhythm.     Heart sounds: No murmur heard. Pulmonary:     Effort: Pulmonary effort is normal. No respiratory distress.     Breath sounds: Normal breath sounds.  Abdominal:     General: Abdomen is flat.     Palpations: Abdomen is soft.     Tenderness: There is no abdominal tenderness.   Musculoskeletal:        General: No tenderness.     Right lower leg: No edema.     Left lower leg: No edema.  Skin:    General: Skin is warm and dry.  Neurological:     General: No focal deficit present.     Mental Status: She is alert. Mental status is at baseline.  Psychiatric:        Mood and Affect: Mood normal.        Behavior: Behavior normal.     ED Results and Treatments Labs (all labs ordered are listed, but only abnormal results are displayed) Labs Reviewed  COMPREHENSIVE METABOLIC PANEL WITH GFR - Abnormal; Notable for the following components:      Result Value   BUN 25 (*)    Creatinine, Ser 1.56 (*)    GFR, Estimated 35 (*)    All other components within normal limits  CBC - Abnormal; Notable for the following components:   MCV 102.2 (*)    All other components within normal limits  LIPASE, BLOOD  Radiology CT ABDOMEN PELVIS WO CONTRAST Result Date: 05/08/2024 CLINICAL DATA:  Abdominal pain. EXAM: CT ABDOMEN AND PELVIS WITHOUT CONTRAST TECHNIQUE: Multidetector CT imaging of the abdomen and pelvis was performed following the standard protocol without IV contrast. RADIATION DOSE REDUCTION: This exam was performed according to the departmental dose-optimization program which includes automated exposure control, adjustment of the mA and/or kV according to patient size and/or use of iterative reconstruction technique. COMPARISON:  None Available. FINDINGS: Evaluation of this exam is limited in the absence of intravenous contrast as well as due to respiratory motion. Evaluation of the pelvic structures is limited due to streak artifact caused by bilateral total hip arthroplasties. Lower chest: The visualized lung bases are clear. There is coronary vascular calcification. No intra-abdominal free air or free fluid. Hepatobiliary: The liver is unremarkable.  No biliary dilatation. Cholecystectomy. Pancreas: The pancreas is atrophic.  No active inflammatory changes. Spleen: Normal in size without focal abnormality. Adrenals/Urinary Tract: The adrenal glands unremarkable. Multiple nonobstructing bilateral renal calculi measure up to 2-3 mm. There is no hydronephrosis or obstructing stone. The visualized ureters appear unremarkable the urinary bladder is poorly visualized. Stomach/Bowel: There is a moderate size hiatal hernia containing the majority of the stomach. Mild sigmoid diverticulosis and scattered colonic diverticula without active inflammatory changes. There is no bowel obstruction or active inflammation. The appendix is normal. Vascular/Lymphatic: Advanced aortoiliac atherosclerotic disease. The IVC is unremarkable. No portal venous gas. There is no adenopathy. Reproductive: The pelvic structures are poorly visualized due to streak artifact caused by bilateral hip arthroplasties. Other: None Musculoskeletal: Osteopenia with degenerative changes of the spine. Bilateral total hip arthroplasties. No acute osseous pathology. IMPRESSION: 1. No acute intra-abdominal or pelvic pathology. 2. Nonobstructing bilateral renal calculi. No hydronephrosis or obstructing stone. 3. Moderate size hiatal hernia. 4. Mild colonic diverticulosis. No bowel obstruction. Normal appendix. 5.  Aortic Atherosclerosis (ICD10-I70.0). Electronically Signed   By: Angus Bark M.D.   On: 05/08/2024 17:05    Pertinent labs & imaging results that were available during my care of the patient were reviewed by me and considered in my medical decision making (see MDM for details).  Medications Ordered in ED Medications  sodium chloride  0.9 % bolus 1,000 mL (1,000 mLs Intravenous Patient Refused/Not Given 05/08/24 1736)  ondansetron  (ZOFRAN -ODT) disintegrating tablet 8 mg (8 mg Oral Given 05/08/24 1849)                                                                                                                                      Procedures Procedures  (including critical care time)  Medical Decision Making / ED Course   MDM:  73 year old presenting to the emergency department with epigastric pain.  Differential includes GERD, pancreatitis, cholecystitis, biliary colic, obstruction, constipation.  Labs do show sign of mild dehydration.  Lipase is negative and LFTs are reassuring, suggesting against pancreatitis or cholecystitis, biliary obstruction.  Given age, symptoms, will check  CT to evaluate for acute intra-abdominal process.  If negative, likely discharge constipation treatment.  Will give fluids for dehydration.  Clinical Course as of 05/08/24 2336  Tue May 08, 2024  1851 CT without sign of acute process, severe constipation, obstruction, impaction. Pt feeling well. She refused IV placement or fluids. Discussed labs with dehydration. She will increase her fluid intake. Advised f/u with PMD this week for a re-check. Will discharge patient to home. All questions answered. Patient comfortable with plan of discharge. Return precautions discussed with patient and specified on the after visit summary.  [WS]    Clinical Course User Index [WS] Isaiah Marc, Dozier Genre, MD     Additional history obtained: -Additional history obtained from family -External records from outside source obtained and reviewed including: Chart review including previous notes, labs, imaging, consultation notes including recent hospitalization   Lab Tests: -I ordered, reviewed, and interpreted labs.   The pertinent results include:   Labs Reviewed  COMPREHENSIVE METABOLIC PANEL WITH GFR - Abnormal; Notable for the following components:      Result Value   BUN 25 (*)    Creatinine, Ser 1.56 (*)    GFR, Estimated 35 (*)    All other components within normal limits  CBC - Abnormal; Notable for the following components:   MCV 102.2 (*)    All other components within normal limits  LIPASE,  BLOOD    Notable for AKI  Imaging Studies ordered: I ordered imaging studies including CT abdomen On my interpretation imaging demonstrates no acute process I independently visualized and interpreted imaging. I agree with the radiologist interpretation   Medicines ordered and prescription drug management: Meds ordered this encounter  Medications   sodium chloride  0.9 % bolus 1,000 mL   ondansetron  (ZOFRAN -ODT) disintegrating tablet 8 mg   ondansetron  (ZOFRAN ) 4 MG tablet    Sig: Take 1 tablet (4 mg total) by mouth every 8 (eight) hours as needed for nausea or vomiting.    Dispense:  16 tablet    Refill:  0    -I have reviewed the patients home medicines and have made adjustments as needed  Reevaluation: After the interventions noted above, I reevaluated the patient and found that their symptoms have improved  Co morbidities that complicate the patient evaluation  Past Medical History:  Diagnosis Date   Anemia    Anxiety    Aortic atherosclerosis (HCC)    Carotid artery disease (HCC)    Depression    GERD (gastroesophageal reflux disease)    Hyperlipidemia    Hypertension    Orthostatic hypotension    Osteoporosis       Dispostion: Disposition decision including need for hospitalization was considered, and patient discharged from emergency department.    Final Clinical Impression(s) / ED Diagnoses Final diagnoses:  Epigastric pain     This chart was dictated using voice recognition software.  Despite best efforts to proofread,  errors can occur which can change the documentation meaning.    Mordecai Applebaum, MD 05/08/24 [phone]

## 2024-05-08 NOTE — Discharge Instructions (Addendum)
 We evaluated you for your abdominal pain.  Your testing in the emergency department is reassuring.  We did not see any signs of any dangerous cause of your symptoms.  We would agree with your primary doctor to increase your Protonix  to twice daily and start MiraLAX .  We will also prescribe you some nausea medicine.  Your laboratory test did show dehydration.  Your kidney function was elevated.  Please be sure to drink lots of fluids.  Please follow-up with your primary doctor for recheck of your laboratory tests.  We would recommend that you have it rechecked in the next few days to make sure that it is improving.  If you have any new or worsening symptoms such as worsening pain, lightheadedness or dizziness, uncontrolled vomiting or nausea, or any other new symptoms, please return to the emergency department.

## 2024-05-08 NOTE — ED Triage Notes (Signed)
 Pt c/o abd pain every time she eats anything, ongoing for a week.  + Nausea and dry heaves.  C/o constipation, pt states she had an xray recently and noted to have a lot of stool.

## 2024-05-09 LAB — ANEMIA PROFILE B
Basophils Absolute: 0 10*3/uL (ref 0.0–0.2)
Basos: 1 %
EOS (ABSOLUTE): 0.2 10*3/uL (ref 0.0–0.4)
Eos: 3 %
Ferritin: 79 ng/mL (ref 15–150)
Folate: 16.6 ng/mL (ref >3.0)
Hematocrit: 40.1 % (ref 34.0–46.6)
Hemoglobin: 13.2 g/dL (ref 11.1–15.9)
Immature Grans (Abs): 0 10*3/uL (ref 0.0–0.1)
Immature Granulocytes: 0 %
Iron Saturation: 28 % (ref 15–55)
Iron: 97 ug/dL (ref 27–139)
Lymphocytes Absolute: 1.9 10*3/uL (ref 0.7–3.1)
Lymphs: 22 %
MCH: 34.2 pg — ABNORMAL HIGH (ref 26.6–33.0)
MCHC: 32.9 g/dL (ref 31.5–35.7)
MCV: 104 fL — ABNORMAL HIGH (ref 79–97)
Monocytes Absolute: 0.8 10*3/uL (ref 0.1–0.9)
Monocytes: 9 %
Neutrophils Absolute: 5.5 10*3/uL (ref 1.4–7.0)
Neutrophils: 65 %
Platelets: 331 10*3/uL (ref 150–450)
RBC: 3.86 x10E6/uL (ref 3.77–5.28)
RDW: 12.1 % (ref 11.7–15.4)
Retic Ct Pct: 1.1 % (ref 0.6–2.6)
Total Iron Binding Capacity: 350 ug/dL (ref 250–450)
UIBC: 253 ug/dL (ref 118–369)
Vitamin B-12: 560 pg/mL (ref 232–1245)
WBC: 8.4 10*3/uL (ref 3.4–10.8)

## 2024-05-17 ENCOUNTER — Ambulatory Visit: Admitting: Orthopedic Surgery

## 2024-05-17 ENCOUNTER — Other Ambulatory Visit (INDEPENDENT_AMBULATORY_CARE_PROVIDER_SITE_OTHER): Payer: Self-pay

## 2024-05-17 ENCOUNTER — Encounter: Payer: Self-pay | Admitting: Orthopedic Surgery

## 2024-05-17 DIAGNOSIS — S72002D Fracture of unspecified part of neck of left femur, subsequent encounter for closed fracture with routine healing: Secondary | ICD-10-CM

## 2024-05-17 DIAGNOSIS — M62838 Other muscle spasm: Secondary | ICD-10-CM

## 2024-05-17 MED ORDER — METHOCARBAMOL 500 MG PO TABS
500.0000 mg | ORAL_TABLET | Freq: Three times a day (TID) | ORAL | 1 refills | Status: DC
Start: 2024-05-17 — End: 2024-10-10

## 2024-05-17 NOTE — Progress Notes (Signed)
   Chief Complaint  Patient presents with   Post-op Follow-up    04/13/24  bipolar hip     Encounter Diagnoses  Name Primary?   Closed fracture of left hip requiring operative repair with routine healing, subsequent encounter 04/13/24 bipolar replacement    Muscle spasms of lower extremity Yes    DOI/DOS/ Date: 04/13/24  Jody Wilson says she had an infection in her left hip.  I was not made aware.  She was on amoxicillin  she said for 9 days.  She has recovered from that.  No other drainage  She does complain of pain from muscle spasms in her left leg  Walking with walker  She refused in-house therapy would rather go outpatient which is 10 minutes from her house  Wound check.  No evidence of infection at this time  Hip flexion is sluggish but 110 degrees  Leg lengths measure equal   DG HIP UNILAT WITH PELVIS 2-3 VIEWS LEFT Result Date: 05/17/2024 Imaging follow-up on left hip bipolar replacement for femoral neck fracture The patient has 2 bipolar replacements 1 on the right 1 on the left The left is the more recent.  Press-fit stem good alignment no evidence of dislocation Impression stable implant left hip status post bipolar replacement for left hip femoral neck fracture      Start outpatient physical therapy  Add Robaxin  for muscle relaxer  Return 6 weeks  Meds ordered this encounter  Medications   methocarbamol  (ROBAXIN ) 500 MG tablet    Sig: Take 1 tablet (500 mg total) by mouth 3 (three) times daily.    Dispense:  60 tablet    Refill:  1

## 2024-05-17 NOTE — Patient Instructions (Signed)
Physical therapy has been ordered for you in [place] . You will need to call and schedule the phone number is [phone]

## 2024-05-17 NOTE — Progress Notes (Signed)
   There were no vitals taken for this visit.  There is no height or weight on file to calculate BMI.  Chief Complaint  Patient presents with   Post-op Follow-up    04/13/24  bipolar hip     Encounter Diagnosis  Name Primary?   Closed fracture of left hip requiring operative repair with routine healing, subsequent encounter 04/13/24 bipolar replacement Yes    DOI/DOS/ Date: 04/13/24  Has had a lot of pain  Walking with walker

## 2024-05-23 ENCOUNTER — Telehealth: Payer: Self-pay

## 2024-05-23 ENCOUNTER — Ambulatory Visit: Admitting: Physical Therapy

## 2024-05-23 ENCOUNTER — Encounter: Payer: Self-pay | Admitting: Family Medicine

## 2024-05-23 ENCOUNTER — Ambulatory Visit: Admitting: Family Medicine

## 2024-05-23 VITALS — BP 111/68 | HR 58 | Temp 97.0°F | Ht 65.0 in | Wt 140.0 lb

## 2024-05-23 DIAGNOSIS — R1013 Epigastric pain: Secondary | ICD-10-CM

## 2024-05-23 DIAGNOSIS — I10 Essential (primary) hypertension: Secondary | ICD-10-CM

## 2024-05-23 DIAGNOSIS — F419 Anxiety disorder, unspecified: Secondary | ICD-10-CM

## 2024-05-23 DIAGNOSIS — F32A Depression, unspecified: Secondary | ICD-10-CM

## 2024-05-23 DIAGNOSIS — E785 Hyperlipidemia, unspecified: Secondary | ICD-10-CM

## 2024-05-23 NOTE — Telephone Encounter (Addendum)
 Pt has history with [facility]. Their contact number is [phone]. Pt will call them. Dettinger placed referral today if needed. Future labs order placed. Lab appt made for 6/5 at 1pm

## 2024-05-23 NOTE — Progress Notes (Signed)
 BP 111/68   Pulse (!) 58   Temp (!) 97 F (36.1 C)   Ht 5\' 5"  (1.651 m)   Wt 140 lb (63.5 kg)   SpO2 93%   BMI 23.30 kg/m    Subjective:   Patient ID: Jody Wilson, female    DOB: 1951/03/15, 73 y.o.   MRN: [id number]  HPI: Jody Wilson is a 73 y.o. female presenting on 05/23/2024 for Recent hip fracture (2w follow up)   HPI Hypertension Patient is currently on olmesartan , and their blood pressure today is 111/68. Patient denies any lightheadedness or dizziness. Patient denies headaches, blurred vision, chest pains, shortness of breath, or weakness. Denies any side effects from medication and is content with current medication.   Hyperlipidemia Patient is coming in for recheck of his hyperlipidemia. The patient is currently taking Zetia  and pravastatin . They deny any issues with myalgias or history of liver damage from it. They deny any focal numbness or weakness or chest pain.   Upper abdominal pain Patient has been having upper abdominal pain.  She states been bothering her over the past few weeks.  She says it will come on after eating and will last 30 minutes to a few hours sometimes and then sometimes she has some vomiting that is mostly mucus and not as much fluid.  She says the pain hurts mostly in the upper abdomen anywhere else.  She tried taking some Tums and it did not seem to help.  She has taken some of the pain pills to help calm the pain but it still been coming back.  Anxiety depression recheck Patient is coming today for escitalopram and Wellbutrin .  She currently takes these and is taking them for anxiety and depression.  She feels like she is with some hip pain and some other pain and that is affecting her mood but overall she feels like the medication is doing well for her.    05/23/2024    9:05 AM 12/27/2023    7:13 PM 09/30/2023   10:55 AM 09/28/2023    9:32 AM 06/30/2023   10:33 AM  Depression screen PHQ 2/9  Decreased Interest 2 1 1 1 1   Down,  Depressed, Hopeless 0 1 1 1 1   PHQ - 2 Score 2 2 2 2 2   Altered sleeping 0 2 1 1 1   Tired, decreased energy 2 1 1 1 3   Change in appetite 2 1 1 1 2   Feeling bad or failure about yourself  0 0 0 0 0  Trouble concentrating 0 0 0 0 0  Moving slowly or fidgety/restless 0 0 0 0 0  Suicidal thoughts 0 0 0 0 0  PHQ-9 Score 6 6 5 5 8   Difficult doing work/chores Somewhat difficult  Somewhat difficult Somewhat difficult Somewhat difficult      Relevant past medical, surgical, family and social history reviewed and updated as indicated. Interim medical history since our last visit reviewed. Allergies and medications reviewed and updated.  Review of Systems  Constitutional:  Negative for chills and fever.  Eyes:  Negative for visual disturbance.  Respiratory:  Negative for chest tightness and shortness of breath.   Cardiovascular:  Negative for chest pain and leg swelling.  Genitourinary:  Negative for difficulty urinating and dysuria.  Musculoskeletal:  Negative for back pain and gait problem.  Skin:  Negative for rash.  Neurological:  Negative for dizziness, light-headedness and headaches.  Psychiatric/Behavioral:  Negative for agitation and behavioral problems.  All other systems reviewed and are negative.   Per HPI unless specifically indicated above   Allergies as of 05/23/2024       Reactions   Tetracyclines & Related Itching, Swelling        Medication List        Accurate as of May 23, 2024  9:46 AM. If you have any questions, ask your nurse or doctor.          ALPRAZolam  0.5 MG tablet Commonly known as: XANAX  Take 1 tablet (0.5 mg total) by mouth 2 (two) times daily as needed for anxiety.   aspirin  EC 81 MG tablet TAKE 1 PO BID THRU 05/13/24 THEN RESUME 1 PO DAILY   buPROPion  150 MG 24 hr tablet Commonly known as: Wellbutrin  XL Take 1 tablet (150 mg total) by mouth daily.   calcium -vitamin D  500-200 MG-UNIT tablet Commonly known as: OSCAL WITH D Take 1  tablet by mouth daily with lunch.   citalopram  40 MG tablet Commonly known as: CELEXA  Take 1 tablet (40 mg total) by mouth daily.   cyanocobalamin  1000 MCG/ML injection Commonly known as: VITAMIN B12 INJECT 1 ML IM EVERY 28 DAYS   docusate sodium  100 MG capsule Commonly known as: COLACE Take 1 capsule (100 mg total) by mouth 2 (two) times daily.   ergocalciferol  1.25 MG (50000 UT) capsule Commonly known as: VITAMIN D2 Take 1 capsule (50,000 Units total) by mouth every 14 (fourteen) days.   ezetimibe  10 MG tablet Commonly known as: ZETIA  Take 1 tablet (10 mg total) by mouth daily.   feeding supplement Liqd Take 237 mLs by mouth 2 (two) times daily between meals.   ferrous sulfate  325 (65 FE) MG tablet Take 1 tablet (325 mg total) by mouth daily with breakfast.   folic acid  1 MG tablet Commonly known as: FOLVITE  Take 1 mg by mouth every other day.   HYDROcodone -acetaminophen  5-325 MG tablet Commonly known as: NORCO/VICODIN Take 1 tablet by mouth every 6 (six) hours as needed.   methocarbamol  500 MG tablet Commonly known as: ROBAXIN  Take 1 tablet (500 mg total) by mouth 3 (three) times daily.   multivitamin with minerals Tabs tablet Take 1 tablet by mouth daily.   olmesartan  20 MG tablet Commonly known as: Benicar  Take 0.5 tablets (10 mg total) by mouth daily.   ondansetron  4 MG tablet Commonly known as: ZOFRAN  Take 1 tablet (4 mg total) by mouth every 8 (eight) hours as needed for nausea or vomiting.   pantoprazole  40 MG tablet Commonly known as: PROTONIX  Take 1 tablet (40 mg total) by mouth daily.   polyethylene glycol 17 g packet Commonly known as: MIRALAX  / GLYCOLAX  Take 17 g by mouth daily as needed for mild constipation.   pravastatin  40 MG tablet Commonly known as: PRAVACHOL  Take 1 tablet (40 mg total) by mouth every evening.         Objective:   BP 111/68   Pulse (!) 58   Temp (!) 97 F (36.1 C)   Ht 5\' 5"  (1.651 m)   Wt 140 lb (63.5 kg)    SpO2 93%   BMI 23.30 kg/m   Wt Readings from Last 3 Encounters:  05/23/24 140 lb (63.5 kg)  05/08/24 135 lb (61.2 kg)  05/08/24 135 lb 12.8 oz (61.6 kg)    Physical Exam Vitals and nursing note reviewed.  Constitutional:      General: She is not in acute distress.    Appearance: She is well-developed. She  is not diaphoretic.  Eyes:     Conjunctiva/sclera: Conjunctivae normal.  Cardiovascular:     Rate and Rhythm: Normal rate and regular rhythm.     Heart sounds: Normal heart sounds. No murmur heard. Pulmonary:     Effort: Pulmonary effort is normal. No respiratory distress.     Breath sounds: Normal breath sounds. No wheezing.  Abdominal:     General: Abdomen is flat. Bowel sounds are normal. There is no distension.     Palpations: Abdomen is soft.     Tenderness: There is abdominal tenderness in the right upper quadrant, epigastric area and left upper quadrant. There is no right CVA tenderness, left CVA tenderness, guarding or rebound.     Hernia: No hernia is present.  Musculoskeletal:        General: No swelling.  Skin:    General: Skin is warm and dry.     Findings: No rash.  Neurological:     Mental Status: She is alert and oriented to person, place, and time.     Coordination: Coordination normal.  Psychiatric:        Behavior: Behavior normal.       Assessment & Plan:   Problem List Items Addressed This Visit       Cardiovascular and Mediastinum   Hypertension - Primary   Relevant Orders   CMP14+EGFR   CBC with Differential/Platelet     Other   Hyperlipidemia with target LDL less than 100   Relevant Orders   CMP14+EGFR   CBC with Differential/Platelet   Anxiety and depression   Other Visit Diagnoses       Epigastric abdominal pain       Relevant Orders   Ambulatory referral to Gastroenterology   CMP14+EGFR   CBC with Differential/Platelet       Will refer back to her gastroenterologist because of upper abdominal pain, concerns that it  possibly could be gallbladder related although the CT scan did not show any inflammation there.  Also could be related to gastritis or maybe an early ulcer.  Kidney numbers were up slightly in the hospital, will recheck them today, will also check liver numbers. Follow up plan: Return in about 3 months (around 08/23/2024), or if symptoms worsen or fail to improve, for Hypertension and hyperlipidemia.  Counseling provided for all of the vaccine components Orders Placed This Encounter  Procedures   CMP14+EGFR   CBC with Differential/Platelet   Ambulatory referral to Gastroenterology    Jolyne Needs, MD [facility] 05/23/2024, 9:46 AM

## 2024-05-23 NOTE — Telephone Encounter (Signed)
 Copied from CRM [id number]. Topic: Clinical - Medical Advice >> May 23, 2024 11:09 AM Antwanette L wrote: Reason for CRM: Patient would like to speak with Dr. Steen Eden about an endoscopy and a possible colonoscopy. The patient would like to have both procedures done at the same time. Please contact patient [phone].

## 2024-05-23 NOTE — Addendum Note (Signed)
 Addended by: Francine Iron on: 05/23/2024 04:22 PM   Modules accepted: Orders

## 2024-05-24 ENCOUNTER — Other Ambulatory Visit

## 2024-05-24 ENCOUNTER — Ambulatory Visit: Attending: Orthopedic Surgery

## 2024-05-24 DIAGNOSIS — M25552 Pain in left hip: Secondary | ICD-10-CM | POA: Diagnosis not present

## 2024-05-24 DIAGNOSIS — R262 Difficulty in walking, not elsewhere classified: Secondary | ICD-10-CM | POA: Insufficient documentation

## 2024-05-24 DIAGNOSIS — S72002D Fracture of unspecified part of neck of left femur, subsequent encounter for closed fracture with routine healing: Secondary | ICD-10-CM | POA: Diagnosis not present

## 2024-05-24 DIAGNOSIS — R1013 Epigastric pain: Secondary | ICD-10-CM

## 2024-05-24 DIAGNOSIS — M25652 Stiffness of left hip, not elsewhere classified: Secondary | ICD-10-CM | POA: Insufficient documentation

## 2024-05-24 DIAGNOSIS — M6281 Muscle weakness (generalized): Secondary | ICD-10-CM | POA: Insufficient documentation

## 2024-05-24 NOTE — Therapy (Signed)
 OUTPATIENT PHYSICAL THERAPY LOWER EXTREMITY EVALUATION   Patient Name: Jody Wilson MRN: [id number] DOB:[birth year], 73 y.o., female Today's Date: 05/24/2024  END OF SESSION:  PT End of Session - 05/24/24 1402     Visit Number 1    Number of Visits 12    Date for PT Re-Evaluation 08/17/24    PT Start Time 1403    PT Stop Time 1430    PT Time Calculation (min) 27 min    Activity Tolerance Patient tolerated treatment well    Behavior During Therapy WFL for tasks assessed/performed             Past Medical History:  Diagnosis Date   Anemia    Anxiety    Aortic atherosclerosis (HCC)    Carotid artery disease (HCC)    Depression    GERD (gastroesophageal reflux disease)    Hyperlipidemia    Hypertension    Orthostatic hypotension    Osteoporosis    Past Surgical History:  Procedure Laterality Date   ABDOMINAL HYSTERECTOMY     BACK SURGERY     CHOLECYSTECTOMY     FOOT SURGERY     7 in the past   HIP ARTHROPLASTY Right 02/11/2023   Procedure: ARTHROPLASTY BIPOLAR HIP (HEMIARTHROPLASTY);  Surgeon: Tonita Frater, MD;  Location: [facility];  Service: Orthopedics;  Laterality: Right;   HIP ARTHROPLASTY Left 04/13/2024   Procedure: HEMIARTHROPLASTY (BIPOLAR) HIP, DIRECT LATERAL  FOR FRACTURE;  Surgeon: Darrin Emerald, MD;  Location: [facility];  Service: Orthopedics;  Laterality: Left;   IR GENERIC HISTORICAL  12/09/2016   IR RADIOLOGIST EVAL & MGMT 12/09/2016 [facility]   IR GENERIC HISTORICAL  12/10/2016   IR VERTEBROPLASTY CERV/THOR BX INC UNI/BIL INC/INJECT/IMAGING 12/10/2016 [facility]   LEFT HEART CATH AND CORONARY ANGIOGRAPHY N/A 09/30/2020   Procedure: LEFT HEART CATH AND CORONARY ANGIOGRAPHY;  Surgeon: Cody Das, MD;  Location: [facility];  Service: Cardiovascular;  Laterality: N/A;   TRACHEOSTOMY TUBE PLACEMENT  1982   Patient Active Problem List   Diagnosis Date Noted   Incontinence of feces 05/08/2024   Closed fracture of neck of left  femur (HCC) 04/13/2024   Hypokalemia 04/13/2024   Closed hip fracture requiring operative repair with routine healing 02/09/2023   Pulmonary nodule 02/09/2023   Hypertriglyceridemia 01/22/2022   Abnormal findings on diagnostic imaging of liver and biliary tract 12/09/2021   Diarrhea 12/09/2021   History of colonic polyps 12/09/2021   Anxiety and depression 06/23/2020   Hiatal hernia 06/23/2020   Thoracic aortic atherosclerosis (HCC) 06/23/2020   Tobacco abuse 04/25/2020   Involuntary muscle jerks while sleeping 09/26/2019   B12 deficiency 09/25/2019   Iron deficiency anemia 09/25/2019   Elevated liver function tests 09/07/2019   Osteoporosis 02/09/2017   History of vertebral fracture 02/09/2017   GERD (gastroesophageal reflux disease) 03/11/2016   Hyperlipidemia with target LDL less than 100 02/19/2015   Hypertension 03/18/2014    PCP: Dettinger, Lucio Sabin, MD  REFERRING PROVIDER: Darrin Emerald, MD   REFERRING DIAG: Closed fracture of left hip requiring operative repair with routine healing, subsequent encounter   THERAPY DIAG:  Pain in left hip  Stiffness of left hip, not elsewhere classified  Muscle weakness (generalized)  Difficulty in walking, not elsewhere classified  Rationale for Evaluation and Treatment: Rehabilitation  ONSET DATE: 04/13/24  SUBJECTIVE:   SUBJECTIVE STATEMENT: Patient reports that she had surgery on her left hip on 04/13/24 after falling at home. She notes that  it was rough to start out with, but it has gotten better. She was in a SNF for about 2 weeks to do therapy. However, it that time she had developed an infection, but that has been resolved now and everything looked good at her last appointment. She still can't sleep on her right and she has pain getting up and down. She has just transitioned to a single point cane in the past two days.   PERTINENT HISTORY: Hypertension, osteoporosis, anxiety, and depression PAIN:  Are you having  pain? Yes: NPRS scale: Current: 0/10 Worst: 8/10 Pain location: left anterior hip Pain description: intermittent soreness Aggravating factors: transfers, squatting, getting dressed, and left side lying Relieving factors: sitting  PRECAUTIONS: Fall  RED FLAGS: None   WEIGHT BEARING RESTRICTIONS: No  FALLS:  Has patient fallen in last 6 months? Yes. Number of falls 2  LIVING ENVIRONMENT: Lives with: lives with their spouse Lives in: House/apartment Stairs: Yes: External: 2 steps; none; step to pattern Has following equipment at home: Single point cane  OCCUPATION: retired  PLOF: Independent  PATIENT GOALS: improved mobility, be able to get into and out of her car, and be able to walk without a cane  NEXT MD VISIT: 06/28/24  OBJECTIVE:  Note: Objective measures were completed at Evaluation unless otherwise noted.  DIAGNOSTIC FINDINGS: 05/17/24 L hip x-ray Impression stable implant left hip status post bipolar replacement for left hip femoral neck fracture  PATIENT SURVEYS:  LEFS  Extreme difficulty/unable (0), Quite a bit of difficulty (1), Moderate difficulty (2), Little difficulty (3), No difficulty (4) Survey date:  05/24/24  Any of your usual work, housework or school activities 2  2. Usual hobbies, recreational or sporting activities 1  3. Getting into/out of the bath 1  4. Walking between rooms 2  5. Putting on socks/shoes 3  6. Squatting  1  7. Lifting an object, like a bag of groceries from the floor 1  8. Performing light activities around your home 2  9. Performing heavy activities around your home 1  10. Getting into/out of a car 1  11. Walking 2 blocks 1  12. Walking 1 mile 1  13. Going up/down 10 stairs (1 flight) 1  14. Standing for 1 hour 1  15.  sitting for 1 hour 3  16. Running on even ground 1  17. Running on uneven ground 1  18. Making sharp turns while running fast 1  19. Hopping  1  20. Rolling over in bed 1  Score total:  27/80      COGNITION: Overall cognitive status: Within functional limits for tasks assessed     SENSATION: Patient reports no numbness or tingling.   PALPATION: TTP: left hip flexors, TFL, IT band, quadriceps, and gluteals  LOWER EXTREMITY ROM:  Active ROM Right eval Left eval  Hip flexion 93 44; limited by pain  Hip extension    Hip abduction    Hip adduction    Hip internal rotation    Hip external rotation    Knee flexion    Knee extension    Ankle dorsiflexion    Ankle plantarflexion    Ankle inversion    Ankle eversion     (Blank rows = not tested)  LOWER EXTREMITY MMT:  MMT Right eval Left eval  Hip flexion 3+/5 3/5; familiar pain  Hip extension    Hip abduction    Hip adduction    Hip internal rotation    Hip external rotation  Knee flexion 4/5 3/5  Knee extension 4+/5 3+/5; limited by familiar pain  Ankle dorsiflexion    Ankle plantarflexion    Ankle inversion    Ankle eversion     (Blank rows = not tested)  LOWER EXTREMITY SPECIAL TESTS:  Not tested due to surgical condition  GAIT: Assistive device utilized: Single point cane Level of assistance: Modified independence Comments: antalgic gait pattern with left hip external rotation                                                                                                                                TREATMENT DATE:     PATIENT EDUCATION:  Education details: POC, prognosis, healing, anatomy, objective findings, and goals for physical therapy Person educated: Patient Education method: Explanation Education comprehension: verbalized understanding  HOME EXERCISE PROGRAM:   ASSESSMENT:  CLINICAL IMPRESSION: Patient is a 73 y.o. female who was seen today for physical therapy evaluation and treatment for left hip pain and stiffness secondary to a left hip hemiarthroplasty on 04/13/24. She presented with moderate to high pain severity with left hip AROM and lower extremity manual muscle testing  reproducing her familiar symptoms. Recommend that she continue with skilled physical therapy to address her impairments to return to her prior level of function.    OBJECTIVE IMPAIRMENTS: Abnormal gait, decreased activity tolerance, decreased balance, decreased mobility, difficulty walking, decreased ROM, decreased strength, hypomobility, impaired tone, and pain.   ACTIVITY LIMITATIONS: standing, squatting, sleeping, stairs, transfers, bed mobility, dressing, and locomotion level  PARTICIPATION LIMITATIONS: cleaning, driving, shopping, and community activity  PERSONAL FACTORS: Past/current experiences, Time since onset of injury/illness/exacerbation, and 3+ comorbidities: Hypertension, osteoporosis, anxiety, and depression are also affecting patient's functional outcome.   REHAB POTENTIAL: Good  CLINICAL DECISION MAKING: Evolving/moderate complexity  EVALUATION COMPLEXITY: Moderate   GOALS: Goals reviewed with patient? Yes  SHORT TERM GOALS: Target date: 06/14/24 Patient will be independent with her initial HEP. Baseline: Goal status: INITIAL  2.  Patient will be able to demonstrate at least 65 degrees of left hip flexion for improved function squatting. Baseline:  Goal status: INITIAL  3.  Patient will improve her LEFS score to at least 40/80 for improved perceived function with her daily activities. Baseline:  Goal status: INITIAL  4.  Patient will be able to safely ambulate without an assistive device for at least 80 feet for improved household mobility. Baseline:  Goal status: INITIAL  LONG TERM GOALS: Target date: 07/05/24  Patient will be independent with her advanced HEP. Baseline:  Goal status: INITIAL  2.  Patient will be able to ambulate with minimal to no significant gait deviations for improved community mobility. Baseline:  Goal status: INITIAL  3.  Patient will improve her LEFS score to at least 50/80 for improved perceived function with her daily  activities. Baseline:  Goal status: INITIAL  4.  Patient will be able to demonstrate at least 85 degrees of left hip  flexion for improved function getting into and out of her car. Baseline:  Goal status: INITIAL  5.  Patient will be able to navigate at least 3 steps with a reciprocal pattern for improved household mobility. Baseline:  Goal status: INITIAL  PLAN:  PT FREQUENCY: 2x/week  PT DURATION: 6 weeks  PLANNED INTERVENTIONS: 57846- PT Re-evaluation, 97750- Physical Performance Testing, 97110-Therapeutic exercises, 97530- Therapeutic activity, [id number]- Neuromuscular re-education, 97535- Self Care, 96295- Manual therapy, [phone]- Gait training, [phone]- Electrical stimulation (unattended), 97016- Vasopneumatic device, Patient/Family education, Balance training, Stair training, Joint mobilization, Cryotherapy, and Moist heat  PLAN FOR NEXT SESSION: Nustep, gait training, lower extremity strengthening, manual therapy, and modalities as needed   Lane Pinon, PT 05/24/2024, 6:22 PM

## 2024-05-25 LAB — CBC WITH DIFFERENTIAL/PLATELET
Basophils Absolute: 0 10*3/uL (ref 0.0–0.2)
Basos: 0 %
EOS (ABSOLUTE): 0.1 10*3/uL (ref 0.0–0.4)
Eos: 2 %
Hematocrit: 40.2 % (ref 34.0–46.6)
Hemoglobin: 12.9 g/dL (ref 11.1–15.9)
Immature Grans (Abs): 0 10*3/uL (ref 0.0–0.1)
Immature Granulocytes: 0 %
Lymphocytes Absolute: 1.9 10*3/uL (ref 0.7–3.1)
Lymphs: 27 %
MCH: 33.2 pg — ABNORMAL HIGH (ref 26.6–33.0)
MCHC: 32.1 g/dL (ref 31.5–35.7)
MCV: 103 fL — ABNORMAL HIGH (ref 79–97)
Monocytes Absolute: 0.5 10*3/uL (ref 0.1–0.9)
Monocytes: 8 %
Neutrophils Absolute: 4.5 10*3/uL (ref 1.4–7.0)
Neutrophils: 63 %
Platelets: 320 10*3/uL (ref 150–450)
RBC: 3.89 x10E6/uL (ref 3.77–5.28)
RDW: 12.1 % (ref 11.7–15.4)
WBC: 7 10*3/uL (ref 3.4–10.8)

## 2024-05-25 LAB — CMP14+EGFR
ALT: 13 IU/L (ref 0–32)
AST: 19 IU/L (ref 0–40)
Albumin: 3.9 g/dL (ref 3.8–4.8)
Alkaline Phosphatase: 84 IU/L (ref 44–121)
BUN/Creatinine Ratio: 13 (ref 12–28)
BUN: 11 mg/dL (ref 8–27)
Bilirubin Total: 0.4 mg/dL (ref 0.0–1.2)
CO2: 22 mmol/L (ref 20–29)
Calcium: 10 mg/dL (ref 8.7–10.3)
Chloride: 104 mmol/L (ref 96–106)
Creatinine, Ser: 0.82 mg/dL (ref 0.57–1.00)
Globulin, Total: 2.6 g/dL (ref 1.5–4.5)
Glucose: 90 mg/dL (ref 70–99)
Potassium: 4.2 mmol/L (ref 3.5–5.2)
Sodium: 142 mmol/L (ref 134–144)
Total Protein: 6.5 g/dL (ref 6.0–8.5)
eGFR: 76 mL/min/{1.73_m2} (ref >59)

## 2024-05-28 ENCOUNTER — Ambulatory Visit

## 2024-05-28 DIAGNOSIS — M6281 Muscle weakness (generalized): Secondary | ICD-10-CM | POA: Diagnosis not present

## 2024-05-28 DIAGNOSIS — M25652 Stiffness of left hip, not elsewhere classified: Secondary | ICD-10-CM

## 2024-05-28 DIAGNOSIS — S72002D Fracture of unspecified part of neck of left femur, subsequent encounter for closed fracture with routine healing: Secondary | ICD-10-CM | POA: Diagnosis not present

## 2024-05-28 DIAGNOSIS — M25552 Pain in left hip: Secondary | ICD-10-CM

## 2024-05-28 DIAGNOSIS — R262 Difficulty in walking, not elsewhere classified: Secondary | ICD-10-CM | POA: Diagnosis not present

## 2024-05-28 NOTE — Therapy (Signed)
 OUTPATIENT PHYSICAL THERAPY LOWER EXTREMITY TREATMENT   Patient Name: Jody Wilson MRN: [id number] DOB:Feb 12, 1951, 73 y.o., female Today's Date: 05/28/2024  END OF SESSION:  PT End of Session - 05/28/24 1611     Visit Number 2    Number of Visits 12    Date for PT Re-Evaluation 08/17/24    PT Start Time 1600    PT Stop Time 1703    PT Time Calculation (min) 63 min    Activity Tolerance Patient tolerated treatment well    Behavior During Therapy WFL for tasks assessed/performed             Past Medical History:  Diagnosis Date   Anemia    Anxiety    Aortic atherosclerosis (HCC)    Carotid artery disease (HCC)    Depression    GERD (gastroesophageal reflux disease)    Hyperlipidemia    Hypertension    Orthostatic hypotension    Osteoporosis    Past Surgical History:  Procedure Laterality Date   ABDOMINAL HYSTERECTOMY     BACK SURGERY     CHOLECYSTECTOMY     FOOT SURGERY     7 in the past   HIP ARTHROPLASTY Right 02/11/2023   Procedure: ARTHROPLASTY BIPOLAR HIP (HEMIARTHROPLASTY);  Surgeon: Tonita Frater, MD;  Location: [facility];  Service: Orthopedics;  Laterality: Right;   HIP ARTHROPLASTY Left 04/13/2024   Procedure: HEMIARTHROPLASTY (BIPOLAR) HIP, DIRECT LATERAL  FOR FRACTURE;  Surgeon: Darrin Emerald, MD;  Location: [facility];  Service: Orthopedics;  Laterality: Left;   IR GENERIC HISTORICAL  12/09/2016   IR RADIOLOGIST EVAL & MGMT 12/09/2016 [facility]   IR GENERIC HISTORICAL  12/10/2016   IR VERTEBROPLASTY CERV/THOR BX INC UNI/BIL INC/INJECT/IMAGING 12/10/2016 [facility]   LEFT HEART CATH AND CORONARY ANGIOGRAPHY N/A 09/30/2020   Procedure: LEFT HEART CATH AND CORONARY ANGIOGRAPHY;  Surgeon: Cody Das, MD;  Location: [facility];  Service: Cardiovascular;  Laterality: N/A;   TRACHEOSTOMY TUBE PLACEMENT  1982   Patient Active Problem List   Diagnosis Date Noted   Incontinence of feces 05/08/2024   Closed fracture of neck of left  femur (HCC) 04/13/2024   Hypokalemia 04/13/2024   Closed hip fracture requiring operative repair with routine healing 02/09/2023   Pulmonary nodule 02/09/2023   Hypertriglyceridemia 01/22/2022   Abnormal findings on diagnostic imaging of liver and biliary tract 12/09/2021   Diarrhea 12/09/2021   History of colonic polyps 12/09/2021   Anxiety and depression 06/23/2020   Hiatal hernia 06/23/2020   Thoracic aortic atherosclerosis (HCC) 06/23/2020   Tobacco abuse 04/25/2020   Involuntary muscle jerks while sleeping 09/26/2019   B12 deficiency 09/25/2019   Iron deficiency anemia 09/25/2019   Elevated liver function tests 09/07/2019   Osteoporosis 02/09/2017   History of vertebral fracture 02/09/2017   GERD (gastroesophageal reflux disease) 03/11/2016   Hyperlipidemia with target LDL less than 100 02/19/2015   Hypertension 03/18/2014    PCP: Dettinger, Lucio Sabin, MD  REFERRING PROVIDER: Darrin Emerald, MD   REFERRING DIAG: Closed fracture of left hip requiring operative repair with routine healing, subsequent encounter   THERAPY DIAG:  Pain in left hip  Stiffness of left hip, not elsewhere classified  Muscle weakness (generalized)  Difficulty in walking, not elsewhere classified  Rationale for Evaluation and Treatment: Rehabilitation  ONSET DATE: 04/13/24  SUBJECTIVE:   SUBJECTIVE STATEMENT: Pt reports minimal soreness today.  PERTINENT HISTORY: Hypertension, osteoporosis, anxiety, and depression PAIN:  Are you having pain?  No  PRECAUTIONS: Fall  RED FLAGS: None   WEIGHT BEARING RESTRICTIONS: No  FALLS:  Has patient fallen in last 6 months? Yes. Number of falls 2  LIVING ENVIRONMENT: Lives with: lives with their spouse Lives in: House/apartment Stairs: Yes: External: 2 steps; none; step to pattern Has following equipment at home: Single point cane  OCCUPATION: retired  PLOF: Independent  PATIENT GOALS: improved mobility, be able to get into and out  of her car, and be able to walk without a cane  NEXT MD VISIT: 06/28/24  OBJECTIVE:  Note: Objective measures were completed at Evaluation unless otherwise noted.  DIAGNOSTIC FINDINGS: 05/17/24 L hip x-ray Impression stable implant left hip status post bipolar replacement for left hip femoral neck fracture  PATIENT SURVEYS:  LEFS  Extreme difficulty/unable (0), Quite a bit of difficulty (1), Moderate difficulty (2), Little difficulty (3), No difficulty (4) Survey date:  05/24/24  Any of your usual work, housework or school activities 2  2. Usual hobbies, recreational or sporting activities 1  3. Getting into/out of the bath 1  4. Walking between rooms 2  5. Putting on socks/shoes 3  6. Squatting  1  7. Lifting an object, like a bag of groceries from the floor 1  8. Performing light activities around your home 2  9. Performing heavy activities around your home 1  10. Getting into/out of a car 1  11. Walking 2 blocks 1  12. Walking 1 mile 1  13. Going up/down 10 stairs (1 flight) 1  14. Standing for 1 hour 1  15.  sitting for 1 hour 3  16. Running on even ground 1  17. Running on uneven ground 1  18. Making sharp turns while running fast 1  19. Hopping  1  20. Rolling over in bed 1  Score total:  27/80     COGNITION: Overall cognitive status: Within functional limits for tasks assessed     SENSATION: Patient reports no numbness or tingling.   PALPATION: TTP: left hip flexors, TFL, IT band, quadriceps, and gluteals  LOWER EXTREMITY ROM:  Active ROM Right eval Left eval  Hip flexion 93 44; limited by pain  Hip extension    Hip abduction    Hip adduction    Hip internal rotation    Hip external rotation    Knee flexion    Knee extension    Ankle dorsiflexion    Ankle plantarflexion    Ankle inversion    Ankle eversion     (Blank rows = not tested)  LOWER EXTREMITY MMT:  MMT Right eval Left eval  Hip flexion 3+/5 3/5; familiar pain  Hip extension    Hip  abduction    Hip adduction    Hip internal rotation    Hip external rotation    Knee flexion 4/5 3/5  Knee extension 4+/5 3+/5; limited by familiar pain  Ankle dorsiflexion    Ankle plantarflexion    Ankle inversion    Ankle eversion     (Blank rows = not tested)  LOWER EXTREMITY SPECIAL TESTS:  Not tested due to surgical condition  GAIT: Assistive device utilized: Single point cane Level of assistance: Modified independence Comments: antalgic gait pattern with left hip external rotation  TREATMENT DATE:      05/28/24                                 EXERCISE LOG  Exercise Repetitions and Resistance Comments  Nustep  Lvl 3 x 15 mins   Seated SKTC 10 reps x 3 sec hold bil   Seated Figure 4 Stretch 20 secs x 3 reps bil   Rockerboard 4 mins   Lunges bil 3 mins each   Standing Hip Abduction 15 reps bil   Standing Hip Extensions 15 reos bil   Thomas Stretch 20 sec x 3 reps    Blank cell = exercise not performed today   Modalities  Date:  Unattended Estim: Hip, IFC 80-150 Hz, 15 mins, Pain and Tone    PATIENT EDUCATION:  Education details: POC, prognosis, healing, anatomy, objective findings, and goals for physical therapy Person educated: Patient Education method: Explanation Education comprehension: verbalized understanding  HOME EXERCISE PROGRAM:   ASSESSMENT:  CLINICAL IMPRESSION: Pt arrives for today's treatment session denying any left hip pain, but does report left hip soreness.  Pt introduced to Nustep today for warm up without discomfort.  Pt instructed in seated and supine hip stretches to decrease pain and tone.  Pt requiring cues for proper technique and hold time.  Pt also instructed in standing exercises with min cues required for technique and posture.  Normal responses to estim noted upon removal.  Pt reported decreased soreness  at completion of today's treatment session.  OBJECTIVE IMPAIRMENTS: Abnormal gait, decreased activity tolerance, decreased balance, decreased mobility, difficulty walking, decreased ROM, decreased strength, hypomobility, impaired tone, and pain.   ACTIVITY LIMITATIONS: standing, squatting, sleeping, stairs, transfers, bed mobility, dressing, and locomotion level  PARTICIPATION LIMITATIONS: cleaning, driving, shopping, and community activity  PERSONAL FACTORS: Past/current experiences, Time since onset of injury/illness/exacerbation, and 3+ comorbidities: Hypertension, osteoporosis, anxiety, and depression are also affecting patient's functional outcome.   REHAB POTENTIAL: Good  CLINICAL DECISION MAKING: Evolving/moderate complexity  EVALUATION COMPLEXITY: Moderate   GOALS: Goals reviewed with patient? Yes  SHORT TERM GOALS: Target date: 06/14/24 Patient will be independent with her initial HEP. Baseline: Goal status: INITIAL  2.  Patient will be able to demonstrate at least 65 degrees of left hip flexion for improved function squatting. Baseline:  Goal status: INITIAL  3.  Patient will improve her LEFS score to at least 40/80 for improved perceived function with her daily activities. Baseline:  Goal status: INITIAL  4.  Patient will be able to safely ambulate without an assistive device for at least 80 feet for improved household mobility. Baseline:  Goal status: INITIAL  LONG TERM GOALS: Target date: 07/05/24  Patient will be independent with her advanced HEP. Baseline:  Goal status: INITIAL  2.  Patient will be able to ambulate with minimal to no significant gait deviations for improved community mobility. Baseline:  Goal status: INITIAL  3.  Patient will improve her LEFS score to at least 50/80 for improved perceived function with her daily activities. Baseline:  Goal status: INITIAL  4.  Patient will be able to demonstrate at least 85 degrees of left hip flexion  for improved function getting into and out of her car. Baseline:  Goal status: INITIAL  5.  Patient will be able to navigate at least 3 steps with a reciprocal pattern for improved household mobility. Baseline:  Goal status: INITIAL  PLAN:  PT FREQUENCY: 2x/week  PT DURATION: 6 weeks  PLANNED INTERVENTIONS: 97164- PT Re-evaluation, 97750- Physical Performance Testing, 97110-Therapeutic exercises, 97530- Therapeutic activity, 97112- Neuromuscular re-education, [phone]- Self Care, 29562- Manual therapy, [phone]- Gait training, [phone]- Electrical stimulation (unattended), 97016- Vasopneumatic device, Patient/Family education, Balance training, Stair training, Joint mobilization, Cryotherapy, and Moist heat  PLAN FOR NEXT SESSION: Nustep, gait training, lower extremity strengthening, manual therapy, and modalities as needed   Deryl Flora, PTA 05/28/2024, 5:09 PM

## 2024-05-30 ENCOUNTER — Ambulatory Visit: Payer: Self-pay | Admitting: Family Medicine

## 2024-05-31 ENCOUNTER — Ambulatory Visit

## 2024-05-31 DIAGNOSIS — S72002D Fracture of unspecified part of neck of left femur, subsequent encounter for closed fracture with routine healing: Secondary | ICD-10-CM | POA: Diagnosis not present

## 2024-05-31 DIAGNOSIS — M25652 Stiffness of left hip, not elsewhere classified: Secondary | ICD-10-CM

## 2024-05-31 DIAGNOSIS — M6281 Muscle weakness (generalized): Secondary | ICD-10-CM | POA: Diagnosis not present

## 2024-05-31 DIAGNOSIS — R262 Difficulty in walking, not elsewhere classified: Secondary | ICD-10-CM | POA: Diagnosis not present

## 2024-05-31 DIAGNOSIS — M25552 Pain in left hip: Secondary | ICD-10-CM

## 2024-05-31 NOTE — Therapy (Signed)
 OUTPATIENT PHYSICAL THERAPY LOWER EXTREMITY TREATMENT   Patient Name: Jody Wilson MRN: [id number] DOB:1951-06-21, 73 y.o., female Today's Date: 05/31/2024  END OF SESSION:  PT End of Session - 05/31/24 1354     Visit Number 3    Number of Visits 12    Date for PT Re-Evaluation 08/17/24    PT Start Time 1345    PT Stop Time 1446    PT Time Calculation (min) 61 min    Activity Tolerance Patient tolerated treatment well    Behavior During Therapy WFL for tasks assessed/performed          Past Medical History:  Diagnosis Date   Anemia    Anxiety    Aortic atherosclerosis (HCC)    Carotid artery disease (HCC)    Depression    GERD (gastroesophageal reflux disease)    Hyperlipidemia    Hypertension    Orthostatic hypotension    Osteoporosis    Past Surgical History:  Procedure Laterality Date   ABDOMINAL HYSTERECTOMY     BACK SURGERY     CHOLECYSTECTOMY     FOOT SURGERY     7 in the past   HIP ARTHROPLASTY Right 02/11/2023   Procedure: ARTHROPLASTY BIPOLAR HIP (HEMIARTHROPLASTY);  Surgeon: Tonita Frater, MD;  Location: [facility];  Service: Orthopedics;  Laterality: Right;   HIP ARTHROPLASTY Left 04/13/2024   Procedure: HEMIARTHROPLASTY (BIPOLAR) HIP, DIRECT LATERAL  FOR FRACTURE;  Surgeon: Darrin Emerald, MD;  Location: [facility];  Service: Orthopedics;  Laterality: Left;   IR GENERIC HISTORICAL  12/09/2016   IR RADIOLOGIST EVAL & MGMT 12/09/2016 [facility]   IR GENERIC HISTORICAL  12/10/2016   IR VERTEBROPLASTY CERV/THOR BX INC UNI/BIL INC/INJECT/IMAGING 12/10/2016 [facility]   LEFT HEART CATH AND CORONARY ANGIOGRAPHY N/A 09/30/2020   Procedure: LEFT HEART CATH AND CORONARY ANGIOGRAPHY;  Surgeon: Cody Das, MD;  Location: [facility];  Service: Cardiovascular;  Laterality: N/A;   TRACHEOSTOMY TUBE PLACEMENT  1982   Patient Active Problem List   Diagnosis Date Noted   Incontinence of feces 05/08/2024   Closed fracture of neck of left  femur (HCC) 04/13/2024   Hypokalemia 04/13/2024   Closed hip fracture requiring operative repair with routine healing 02/09/2023   Pulmonary nodule 02/09/2023   Hypertriglyceridemia 01/22/2022   Abnormal findings on diagnostic imaging of liver and biliary tract 12/09/2021   Diarrhea 12/09/2021   History of colonic polyps 12/09/2021   Anxiety and depression 06/23/2020   Hiatal hernia 06/23/2020   Thoracic aortic atherosclerosis (HCC) 06/23/2020   Tobacco abuse 04/25/2020   Involuntary muscle jerks while sleeping 09/26/2019   B12 deficiency 09/25/2019   Iron deficiency anemia 09/25/2019   Elevated liver function tests 09/07/2019   Osteoporosis 02/09/2017   History of vertebral fracture 02/09/2017   GERD (gastroesophageal reflux disease) 03/11/2016   Hyperlipidemia with target LDL less than 100 02/19/2015   Hypertension 03/18/2014    PCP: Dettinger, Lucio Sabin, MD  REFERRING PROVIDER: Darrin Emerald, MD   REFERRING DIAG: Closed fracture of left hip requiring operative repair with routine healing, subsequent encounter   THERAPY DIAG:  Pain in left hip  Stiffness of left hip, not elsewhere classified  Muscle weakness (generalized)  Difficulty in walking, not elsewhere classified  Rationale for Evaluation and Treatment: Rehabilitation  ONSET DATE: 04/13/24  SUBJECTIVE:   SUBJECTIVE STATEMENT: Pt reports minimal soreness today.  Pt reports performing stretches at home.   PERTINENT HISTORY: Hypertension, osteoporosis, anxiety, and depression PAIN:  Are you having pain? No  PRECAUTIONS: Fall  RED FLAGS: None   WEIGHT BEARING RESTRICTIONS: No  FALLS:  Has patient fallen in last 6 months? Yes. Number of falls 2  LIVING ENVIRONMENT: Lives with: lives with their spouse Lives in: House/apartment Stairs: Yes: External: 2 steps; none; step to pattern Has following equipment at home: Single point cane  OCCUPATION: retired  PLOF: Independent  PATIENT GOALS:  improved mobility, be able to get into and out of her car, and be able to walk without a cane  NEXT MD VISIT: 06/28/24  OBJECTIVE:  Note: Objective measures were completed at Evaluation unless otherwise noted.  DIAGNOSTIC FINDINGS: 05/17/24 L hip x-ray Impression stable implant left hip status post bipolar replacement for left hip femoral neck fracture  PATIENT SURVEYS:  LEFS  Extreme difficulty/unable (0), Quite a bit of difficulty (1), Moderate difficulty (2), Little difficulty (3), No difficulty (4) Survey date:  05/24/24  Any of your usual work, housework or school activities 2  2. Usual hobbies, recreational or sporting activities 1  3. Getting into/out of the bath 1  4. Walking between rooms 2  5. Putting on socks/shoes 3  6. Squatting  1  7. Lifting an object, like a bag of groceries from the floor 1  8. Performing light activities around your home 2  9. Performing heavy activities around your home 1  10. Getting into/out of a car 1  11. Walking 2 blocks 1  12. Walking 1 mile 1  13. Going up/down 10 stairs (1 flight) 1  14. Standing for 1 hour 1  15.  sitting for 1 hour 3  16. Running on even ground 1  17. Running on uneven ground 1  18. Making sharp turns while running fast 1  19. Hopping  1  20. Rolling over in bed 1  Score total:  27/80     COGNITION: Overall cognitive status: Within functional limits for tasks assessed     SENSATION: Patient reports no numbness or tingling.   PALPATION: TTP: left hip flexors, TFL, IT band, quadriceps, and gluteals  LOWER EXTREMITY ROM:  Active ROM Right eval Left eval  Hip flexion 93 44; limited by pain  Hip extension    Hip abduction    Hip adduction    Hip internal rotation    Hip external rotation    Knee flexion    Knee extension    Ankle dorsiflexion    Ankle plantarflexion    Ankle inversion    Ankle eversion     (Blank rows = not tested)  LOWER EXTREMITY MMT:  MMT Right eval Left eval  Hip flexion  3+/5 3/5; familiar pain  Hip extension    Hip abduction    Hip adduction    Hip internal rotation    Hip external rotation    Knee flexion 4/5 3/5  Knee extension 4+/5 3+/5; limited by familiar pain  Ankle dorsiflexion    Ankle plantarflexion    Ankle inversion    Ankle eversion     (Blank rows = not tested)  LOWER EXTREMITY SPECIAL TESTS:  Not tested due to surgical condition  GAIT: Assistive device utilized: Single point cane Level of assistance: Modified independence Comments: antalgic gait pattern with left hip external rotation  TREATMENT DATE:     05/31/24                                 EXERCISE LOG  Exercise Repetitions and Resistance Comments  Nustep  Lvl 3 x 15 mins   Seated SKTC    Forward Step Ups 6 box x 15 reps   Rockerboard 5 mins   Lunges bil 8 box x 3 mins each   Standing Hip Abduction 15 reps bil   Standing Hip Extensions 15 reps bil   Standing Hip Flexion 15 reps bil   Thomas Stretch 5 reps x 30 secs    Blank cell = exercise not performed today   Modalities  Date:  Unattended Estim: Hip, IFC 80-150 Hz, 15 mins, Pain and Tone    PATIENT EDUCATION:  Education details: POC, prognosis, healing, anatomy, objective findings, and goals for physical therapy Person educated: Patient Education method: Explanation Education comprehension: verbalized understanding  HOME EXERCISE PROGRAM:   ASSESSMENT:  CLINICAL IMPRESSION: Pt arrives for today's treatment session denying any left hip pain, but does report left hip soreness.  Pt reports that she has been performing stretches at home with good results.  Pt introduced to forward step ups with very minimal cues required for sequencing.  Normal responses to estim noted upon removal.  Pt denied any pain at completion of today's treatment session.  OBJECTIVE IMPAIRMENTS: Abnormal  gait, decreased activity tolerance, decreased balance, decreased mobility, difficulty walking, decreased ROM, decreased strength, hypomobility, impaired tone, and pain.   ACTIVITY LIMITATIONS: standing, squatting, sleeping, stairs, transfers, bed mobility, dressing, and locomotion level  PARTICIPATION LIMITATIONS: cleaning, driving, shopping, and community activity  PERSONAL FACTORS: Past/current experiences, Time since onset of injury/illness/exacerbation, and 3+ comorbidities: Hypertension, osteoporosis, anxiety, and depression are also affecting patient's functional outcome.   REHAB POTENTIAL: Good  CLINICAL DECISION MAKING: Evolving/moderate complexity  EVALUATION COMPLEXITY: Moderate   GOALS: Goals reviewed with patient? Yes  SHORT TERM GOALS: Target date: 06/14/24 Patient will be independent with her initial HEP. Baseline: Goal status: INITIAL  2.  Patient will be able to demonstrate at least 65 degrees of left hip flexion for improved function squatting. Baseline:  Goal status: INITIAL  3.  Patient will improve her LEFS score to at least 40/80 for improved perceived function with her daily activities. Baseline:  Goal status: INITIAL  4.  Patient will be able to safely ambulate without an assistive device for at least 80 feet for improved household mobility. Baseline:  Goal status: INITIAL  LONG TERM GOALS: Target date: 07/05/24  Patient will be independent with her advanced HEP. Baseline:  Goal status: INITIAL  2.  Patient will be able to ambulate with minimal to no significant gait deviations for improved community mobility. Baseline:  Goal status: INITIAL  3.  Patient will improve her LEFS score to at least 50/80 for improved perceived function with her daily activities. Baseline:  Goal status: INITIAL  4.  Patient will be able to demonstrate at least 85 degrees of left hip flexion for improved function getting into and out of her car. Baseline:  Goal status:  INITIAL  5.  Patient will be able to navigate at least 3 steps with a reciprocal pattern for improved household mobility. Baseline:  Goal status: INITIAL  PLAN:  PT FREQUENCY: 2x/week  PT DURATION: 6 weeks  PLANNED INTERVENTIONS: 97164- PT Re-evaluation, 97750- Physical Performance Testing, 97110-Therapeutic exercises, 97530- Therapeutic activity, [id number]-  Neuromuscular re-education, [phone]- Self Care, 60454- Manual therapy, [phone]- Gait training, [phone]- Electrical stimulation (unattended), [phone]- Vasopneumatic device, Patient/Family education, Balance training, Stair training, Joint mobilization, Cryotherapy, and Moist heat  PLAN FOR NEXT SESSION: Nustep, gait training, lower extremity strengthening, manual therapy, and modalities as needed   Deryl Flora, PTA 05/31/2024, 3:08 PM

## 2024-06-04 ENCOUNTER — Other Ambulatory Visit: Payer: Self-pay | Admitting: *Deleted

## 2024-06-04 ENCOUNTER — Telehealth: Payer: Self-pay | Admitting: *Deleted

## 2024-06-04 ENCOUNTER — Ambulatory Visit

## 2024-06-04 DIAGNOSIS — F419 Anxiety disorder, unspecified: Secondary | ICD-10-CM

## 2024-06-04 DIAGNOSIS — E785 Hyperlipidemia, unspecified: Secondary | ICD-10-CM

## 2024-06-04 DIAGNOSIS — M25552 Pain in left hip: Secondary | ICD-10-CM | POA: Diagnosis not present

## 2024-06-04 DIAGNOSIS — S72002D Fracture of unspecified part of neck of left femur, subsequent encounter for closed fracture with routine healing: Secondary | ICD-10-CM | POA: Diagnosis not present

## 2024-06-04 DIAGNOSIS — M6281 Muscle weakness (generalized): Secondary | ICD-10-CM | POA: Diagnosis not present

## 2024-06-04 DIAGNOSIS — M25652 Stiffness of left hip, not elsewhere classified: Secondary | ICD-10-CM | POA: Diagnosis not present

## 2024-06-04 DIAGNOSIS — R262 Difficulty in walking, not elsewhere classified: Secondary | ICD-10-CM | POA: Diagnosis not present

## 2024-06-04 MED ORDER — PANTOPRAZOLE SODIUM 40 MG PO TBEC: 40.0000 mg | DELAYED_RELEASE_TABLET | Freq: Two times a day (BID) | ORAL | 3 refills | Status: DC

## 2024-06-04 MED ORDER — BUPROPION HCL ER (XL) 150 MG PO TB24: 150.0000 mg | ORAL_TABLET | Freq: Every day | ORAL | 2 refills | Status: DC

## 2024-06-04 MED ORDER — CITALOPRAM HYDROBROMIDE 40 MG PO TABS: 40.0000 mg | ORAL_TABLET | Freq: Every day | ORAL | 1 refills | Status: DC

## 2024-06-04 MED ORDER — PRAVASTATIN SODIUM 40 MG PO TABS: 40.0000 mg | ORAL_TABLET | Freq: Every evening | ORAL | 3 refills | Status: AC

## 2024-06-04 NOTE — Addendum Note (Signed)
 Addended by: Jolyne Needs on: 06/04/2024 01:51 PM   Modules accepted: Orders

## 2024-06-04 NOTE — Telephone Encounter (Signed)
 Fax from [facility] pharmacy RE: Pantoprazole  40 mg Pt states dose was increased to BID Please advise and send new script if appropriate

## 2024-06-04 NOTE — Therapy (Signed)
 OUTPATIENT PHYSICAL THERAPY LOWER EXTREMITY TREATMENT   Patient Name: Jody Wilson MRN: [id number] DOB:[birth year], 73 y.o., female Today's Date: 06/04/2024  END OF SESSION:  PT End of Session - 06/04/24 1104     Visit Number 4    Number of Visits 12    Date for PT Re-Evaluation 08/17/24    PT Start Time 1100    PT Stop Time 1201    PT Time Calculation (min) 61 min    Activity Tolerance Patient tolerated treatment well    Behavior During Therapy WFL for tasks assessed/performed          Past Medical History:  Diagnosis Date   Anemia    Anxiety    Aortic atherosclerosis (HCC)    Carotid artery disease (HCC)    Depression    GERD (gastroesophageal reflux disease)    Hyperlipidemia    Hypertension    Orthostatic hypotension    Osteoporosis    Past Surgical History:  Procedure Laterality Date   ABDOMINAL HYSTERECTOMY     BACK SURGERY     CHOLECYSTECTOMY     FOOT SURGERY     7 in the past   HIP ARTHROPLASTY Right 02/11/2023   Procedure: ARTHROPLASTY BIPOLAR HIP (HEMIARTHROPLASTY);  Surgeon: Tonita Frater, MD;  Location: [facility];  Service: Orthopedics;  Laterality: Right;   HIP ARTHROPLASTY Left 04/13/2024   Procedure: HEMIARTHROPLASTY (BIPOLAR) HIP, DIRECT LATERAL  FOR FRACTURE;  Surgeon: Darrin Emerald, MD;  Location: [facility];  Service: Orthopedics;  Laterality: Left;   IR GENERIC HISTORICAL  12/09/2016   IR RADIOLOGIST EVAL & MGMT 12/09/2016 [facility]   IR GENERIC HISTORICAL  12/10/2016   IR VERTEBROPLASTY CERV/THOR BX INC UNI/BIL INC/INJECT/IMAGING 12/10/2016 [facility]   LEFT HEART CATH AND CORONARY ANGIOGRAPHY N/A 09/30/2020   Procedure: LEFT HEART CATH AND CORONARY ANGIOGRAPHY;  Surgeon: Cody Das, MD;  Location: [facility];  Service: Cardiovascular;  Laterality: N/A;   TRACHEOSTOMY TUBE PLACEMENT  1982   Patient Active Problem List   Diagnosis Date Noted   Incontinence of feces 05/08/2024   Closed fracture of neck of left  femur (HCC) 04/13/2024   Hypokalemia 04/13/2024   Closed hip fracture requiring operative repair with routine healing 02/09/2023   Pulmonary nodule 02/09/2023   Hypertriglyceridemia 01/22/2022   Abnormal findings on diagnostic imaging of liver and biliary tract 12/09/2021   Diarrhea 12/09/2021   History of colonic polyps 12/09/2021   Anxiety and depression 06/23/2020   Hiatal hernia 06/23/2020   Thoracic aortic atherosclerosis (HCC) 06/23/2020   Tobacco abuse 04/25/2020   Involuntary muscle jerks while sleeping 09/26/2019   B12 deficiency 09/25/2019   Iron deficiency anemia 09/25/2019   Elevated liver function tests 09/07/2019   Osteoporosis 02/09/2017   History of vertebral fracture 02/09/2017   GERD (gastroesophageal reflux disease) 03/11/2016   Hyperlipidemia with target LDL less than 100 02/19/2015   Hypertension 03/18/2014    PCP: Dettinger, Lucio Sabin, MD  REFERRING PROVIDER: Darrin Emerald, MD   REFERRING DIAG: Closed fracture of left hip requiring operative repair with routine healing, subsequent encounter   THERAPY DIAG:  Pain in left hip  Stiffness of left hip, not elsewhere classified  Muscle weakness (generalized)  Difficulty in walking, not elsewhere classified  Rationale for Evaluation and Treatment: Rehabilitation  ONSET DATE: 04/13/24  SUBJECTIVE:   SUBJECTIVE STATEMENT: Pt reports 4/10 left hip pain, concentrating in her groin.   PERTINENT HISTORY: Hypertension, osteoporosis, anxiety, and depression PAIN:  Are  you having pain? Yes: NPRS scale: 4/10 Pain location: left hip  PRECAUTIONS: Fall  RED FLAGS: None   WEIGHT BEARING RESTRICTIONS: No  FALLS:  Has patient fallen in last 6 months? Yes. Number of falls 2  LIVING ENVIRONMENT: Lives with: lives with their spouse Lives in: House/apartment Stairs: Yes: External: 2 steps; none; step to pattern Has following equipment at home: Single point cane  OCCUPATION: retired  PLOF:  Independent  PATIENT GOALS: improved mobility, be able to get into and out of her car, and be able to walk without a cane  NEXT MD VISIT: 06/28/24  OBJECTIVE:  Note: Objective measures were completed at Evaluation unless otherwise noted.  DIAGNOSTIC FINDINGS: 05/17/24 L hip x-ray Impression stable implant left hip status post bipolar replacement for left hip femoral neck fracture  PATIENT SURVEYS:  LEFS  Extreme difficulty/unable (0), Quite a bit of difficulty (1), Moderate difficulty (2), Little difficulty (3), No difficulty (4) Survey date:  05/24/24  Any of your usual work, housework or school activities 2  2. Usual hobbies, recreational or sporting activities 1  3. Getting into/out of the bath 1  4. Walking between rooms 2  5. Putting on socks/shoes 3  6. Squatting  1  7. Lifting an object, like a bag of groceries from the floor 1  8. Performing light activities around your home 2  9. Performing heavy activities around your home 1  10. Getting into/out of a car 1  11. Walking 2 blocks 1  12. Walking 1 mile 1  13. Going up/down 10 stairs (1 flight) 1  14. Standing for 1 hour 1  15.  sitting for 1 hour 3  16. Running on even ground 1  17. Running on uneven ground 1  18. Making sharp turns while running fast 1  19. Hopping  1  20. Rolling over in bed 1  Score total:  27/80     COGNITION: Overall cognitive status: Within functional limits for tasks assessed     SENSATION: Patient reports no numbness or tingling.   PALPATION: TTP: left hip flexors, TFL, IT band, quadriceps, and gluteals  LOWER EXTREMITY ROM:  Active ROM Right eval Left eval  Hip flexion 93 44; limited by pain  Hip extension    Hip abduction    Hip adduction    Hip internal rotation    Hip external rotation    Knee flexion    Knee extension    Ankle dorsiflexion    Ankle plantarflexion    Ankle inversion    Ankle eversion     (Blank rows = not tested)  LOWER EXTREMITY MMT:  MMT  Right eval Left eval  Hip flexion 3+/5 3/5; familiar pain  Hip extension    Hip abduction    Hip adduction    Hip internal rotation    Hip external rotation    Knee flexion 4/5 3/5  Knee extension 4+/5 3+/5; limited by familiar pain  Ankle dorsiflexion    Ankle plantarflexion    Ankle inversion    Ankle eversion     (Blank rows = not tested)  LOWER EXTREMITY SPECIAL TESTS:  Not tested due to surgical condition  GAIT: Assistive device utilized: Single point cane Level of assistance: Modified independence Comments: antalgic gait pattern with left hip external rotation  TREATMENT DATE:     06/04/24                                 EXERCISE LOG  Exercise Repetitions and Resistance Comments  Nustep  Lvl 4 x 15 mins   Seated SKTC    Forward Step Ups 6 box x 15 reps   Rockerboard 5 mins   Lunges bil 14 box x 3 mins each   Standing Hip Abduction 30 reps bil   Standing Hip Extensions 30 reps bil   Standing Hip Flexion 30 reps bil   Thomas Stretch     Blank cell = exercise not performed today   Modalities  Date:  Unattended Estim: Hip, IFC 80-150 Hz, 15 mins, Pain and Tone    PATIENT EDUCATION:  Education details: POC, prognosis, healing, anatomy, objective findings, and goals for physical therapy Person educated: Patient Education method: Explanation Education comprehension: verbalized understanding  HOME EXERCISE PROGRAM:   ASSESSMENT:  CLINICAL IMPRESSION: Pt arrives for today's treatment session reporting 4/10 left hip pain today, mostly in her groin area.  Pt able to tolerate increased step height with forward lunges today resulting in increased left hip groin stretch.  Pt able to tolerate increased reps with standing exercises today with minimal increase in pain and fatigue noted.  Normal responses to estim noted upon removal.  Pt  reported decreased pain at completion of today's treatment session.   OBJECTIVE IMPAIRMENTS: Abnormal gait, decreased activity tolerance, decreased balance, decreased mobility, difficulty walking, decreased ROM, decreased strength, hypomobility, impaired tone, and pain.   ACTIVITY LIMITATIONS: standing, squatting, sleeping, stairs, transfers, bed mobility, dressing, and locomotion level  PARTICIPATION LIMITATIONS: cleaning, driving, shopping, and community activity  PERSONAL FACTORS: Past/current experiences, Time since onset of injury/illness/exacerbation, and 3+ comorbidities: Hypertension, osteoporosis, anxiety, and depression are also affecting patient's functional outcome.   REHAB POTENTIAL: Good  CLINICAL DECISION MAKING: Evolving/moderate complexity  EVALUATION COMPLEXITY: Moderate   GOALS: Goals reviewed with patient? Yes  SHORT TERM GOALS: Target date: 06/14/24 Patient will be independent with her initial HEP. Baseline: Goal status: INITIAL  2.  Patient will be able to demonstrate at least 65 degrees of left hip flexion for improved function squatting. Baseline:  Goal status: INITIAL  3.  Patient will improve her LEFS score to at least 40/80 for improved perceived function with her daily activities. Baseline:  Goal status: INITIAL  4.  Patient will be able to safely ambulate without an assistive device for at least 80 feet for improved household mobility. Baseline:  Goal status: INITIAL  LONG TERM GOALS: Target date: 07/05/24  Patient will be independent with her advanced HEP. Baseline:  Goal status: INITIAL  2.  Patient will be able to ambulate with minimal to no significant gait deviations for improved community mobility. Baseline:  Goal status: INITIAL  3.  Patient will improve her LEFS score to at least 50/80 for improved perceived function with her daily activities. Baseline:  Goal status: INITIAL  4.  Patient will be able to demonstrate at least 85  degrees of left hip flexion for improved function getting into and out of her car. Baseline:  Goal status: INITIAL  5.  Patient will be able to navigate at least 3 steps with a reciprocal pattern for improved household mobility. Baseline:  Goal status: INITIAL  PLAN:  PT FREQUENCY: 2x/week  PT DURATION: 6 weeks  PLANNED INTERVENTIONS: 16109- PT Re-evaluation, 97750- Physical Performance  Testing, 97110-Therapeutic exercises, 97530- Therapeutic activity, [id number]- Neuromuscular re-education, [phone]- Self Care, 19147- Manual therapy, [phone]- Gait training, [phone]- Electrical stimulation (unattended), [phone]- Vasopneumatic device, Patient/Family education, Balance training, Stair training, Joint mobilization, Cryotherapy, and Moist heat  PLAN FOR NEXT SESSION: Nustep, gait training, lower extremity strengthening, manual therapy, and modalities as needed   Deryl Flora, PTA 06/04/2024, 12:14 PM

## 2024-06-04 NOTE — Telephone Encounter (Signed)
Sent pantoprazole.

## 2024-06-07 ENCOUNTER — Encounter: Admitting: Physical Therapy

## 2024-06-11 ENCOUNTER — Ambulatory Visit: Admitting: *Deleted

## 2024-06-11 ENCOUNTER — Encounter: Payer: Self-pay | Admitting: *Deleted

## 2024-06-11 ENCOUNTER — Other Ambulatory Visit: Payer: Self-pay | Admitting: Family Medicine

## 2024-06-11 DIAGNOSIS — M6281 Muscle weakness (generalized): Secondary | ICD-10-CM

## 2024-06-11 DIAGNOSIS — R262 Difficulty in walking, not elsewhere classified: Secondary | ICD-10-CM | POA: Diagnosis not present

## 2024-06-11 DIAGNOSIS — F419 Anxiety disorder, unspecified: Secondary | ICD-10-CM

## 2024-06-11 DIAGNOSIS — S72002D Fracture of unspecified part of neck of left femur, subsequent encounter for closed fracture with routine healing: Secondary | ICD-10-CM | POA: Diagnosis not present

## 2024-06-11 DIAGNOSIS — M25552 Pain in left hip: Secondary | ICD-10-CM | POA: Diagnosis not present

## 2024-06-11 DIAGNOSIS — M25652 Stiffness of left hip, not elsewhere classified: Secondary | ICD-10-CM | POA: Diagnosis not present

## 2024-06-11 NOTE — Therapy (Signed)
 OUTPATIENT PHYSICAL THERAPY LOWER EXTREMITY TREATMENT   Patient Name: Jody Wilson MRN: [id number] DOB:[birth year], 73 y.o., female Today's Date: 06/11/2024  END OF SESSION:  PT End of Session - 06/11/24 1442     Visit Number 5    Number of Visits 12    Date for PT Re-Evaluation 08/17/24    PT Start Time 1430    PT Stop Time 1520    PT Time Calculation (min) 50 min          Past Medical History:  Diagnosis Date   Anemia    Anxiety    Aortic atherosclerosis (HCC)    Carotid artery disease (HCC)    Depression    GERD (gastroesophageal reflux disease)    Hyperlipidemia    Hypertension    Orthostatic hypotension    Osteoporosis    Past Surgical History:  Procedure Laterality Date   ABDOMINAL HYSTERECTOMY     BACK SURGERY     CHOLECYSTECTOMY     FOOT SURGERY     7 in the past   HIP ARTHROPLASTY Right 02/11/2023   Procedure: ARTHROPLASTY BIPOLAR HIP (HEMIARTHROPLASTY);  Surgeon: Onesimo Oneil LABOR, MD;  Location: [facility];  Service: Orthopedics;  Laterality: Right;   HIP ARTHROPLASTY Left 04/13/2024   Procedure: HEMIARTHROPLASTY (BIPOLAR) HIP, DIRECT LATERAL  FOR FRACTURE;  Surgeon: Margrette Taft BRAVO, MD;  Location: [facility];  Service: Orthopedics;  Laterality: Left;   IR GENERIC HISTORICAL  12/09/2016   IR RADIOLOGIST EVAL & MGMT 12/09/2016 [facility]   IR GENERIC HISTORICAL  12/10/2016   IR VERTEBROPLASTY CERV/THOR BX INC UNI/BIL INC/INJECT/IMAGING 12/10/2016 [facility]   LEFT HEART CATH AND CORONARY ANGIOGRAPHY N/A 09/30/2020   Procedure: LEFT HEART CATH AND CORONARY ANGIOGRAPHY;  Surgeon: Elmira Newman PARAS, MD;  Location: [facility];  Service: Cardiovascular;  Laterality: N/A;   TRACHEOSTOMY TUBE PLACEMENT  1982   Patient Active Problem List   Diagnosis Date Noted   Incontinence of feces 05/08/2024   Closed fracture of neck of left femur (HCC) 04/13/2024   Hypokalemia 04/13/2024   Closed hip fracture requiring operative repair with routine healing  02/09/2023   Pulmonary nodule 02/09/2023   Hypertriglyceridemia 01/22/2022   Abnormal findings on diagnostic imaging of liver and biliary tract 12/09/2021   Diarrhea 12/09/2021   History of colonic polyps 12/09/2021   Anxiety and depression 06/23/2020   Hiatal hernia 06/23/2020   Thoracic aortic atherosclerosis (HCC) 06/23/2020   Tobacco abuse 04/25/2020   Involuntary muscle jerks while sleeping 09/26/2019   B12 deficiency 09/25/2019   Iron deficiency anemia 09/25/2019   Elevated liver function tests 09/07/2019   Osteoporosis 02/09/2017   History of vertebral fracture 02/09/2017   GERD (gastroesophageal reflux disease) 03/11/2016   Hyperlipidemia with target LDL less than 100 02/19/2015   Hypertension 03/18/2014    PCP: Dettinger, Fonda LABOR, MD  REFERRING PROVIDER: Margrette Taft BRAVO, MD   REFERRING DIAG: Closed fracture of left hip requiring operative repair with routine healing, subsequent encounter   THERAPY DIAG:  Stiffness of left hip, not elsewhere classified  Muscle weakness (generalized)  Pain in left hip  Rationale for Evaluation and Treatment: Rehabilitation  ONSET DATE: 04/13/24  SUBJECTIVE:   SUBJECTIVE STATEMENT: Pt reports 4/10 left hip pain. Brought Home TENS   PERTINENT HISTORY: Hypertension, osteoporosis, anxiety, and depression PAIN:  Are you having pain? Yes: NPRS scale: 4/10 Pain location: left hip  PRECAUTIONS: Fall  RED FLAGS: None   WEIGHT BEARING RESTRICTIONS: No  FALLS:  Has patient fallen in last 6 months? Yes. Number of falls 2  LIVING ENVIRONMENT: Lives with: lives with their spouse Lives in: House/apartment Stairs: Yes: External: 2 steps; none; step to pattern Has following equipment at home: Single point cane  OCCUPATION: retired  PLOF: Independent  PATIENT GOALS: improved mobility, be able to get into and out of her car, and be able to walk without a cane  NEXT MD VISIT: 06/28/24  OBJECTIVE:  Note: Objective  measures were completed at Evaluation unless otherwise noted.  DIAGNOSTIC FINDINGS: 05/17/24 L hip x-ray Impression stable implant left hip status post bipolar replacement for left hip femoral neck fracture  PATIENT SURVEYS:  LEFS  Extreme difficulty/unable (0), Quite a bit of difficulty (1), Moderate difficulty (2), Little difficulty (3), No difficulty (4) Survey date:  05/24/24  Any of your usual work, housework or school activities 2  2. Usual hobbies, recreational or sporting activities 1  3. Getting into/out of the bath 1  4. Walking between rooms 2  5. Putting on socks/shoes 3  6. Squatting  1  7. Lifting an object, like a bag of groceries from the floor 1  8. Performing light activities around your home 2  9. Performing heavy activities around your home 1  10. Getting into/out of a car 1  11. Walking 2 blocks 1  12. Walking 1 mile 1  13. Going up/down 10 stairs (1 flight) 1  14. Standing for 1 hour 1  15.  sitting for 1 hour 3  16. Running on even ground 1  17. Running on uneven ground 1  18. Making sharp turns while running fast 1  19. Hopping  1  20. Rolling over in bed 1  Score total:  27/80     COGNITION: Overall cognitive status: Within functional limits for tasks assessed     SENSATION: Patient reports no numbness or tingling.   PALPATION: TTP: left hip flexors, TFL, IT band, quadriceps, and gluteals  LOWER EXTREMITY ROM:  Active ROM Right eval Left eval  Hip flexion 93 44; limited by pain  Hip extension    Hip abduction    Hip adduction    Hip internal rotation    Hip external rotation    Knee flexion    Knee extension    Ankle dorsiflexion    Ankle plantarflexion    Ankle inversion    Ankle eversion     (Blank rows = not tested)  LOWER EXTREMITY MMT:  MMT Right eval Left eval  Hip flexion 3+/5 3/5; familiar pain  Hip extension    Hip abduction    Hip adduction    Hip internal rotation    Hip external rotation    Knee flexion 4/5 3/5   Knee extension 4+/5 3+/5; limited by familiar pain  Ankle dorsiflexion    Ankle plantarflexion    Ankle inversion    Ankle eversion     (Blank rows = not tested)  LOWER EXTREMITY SPECIAL TESTS:  Not tested due to surgical condition  GAIT: Assistive device utilized: Single point cane Level of assistance: Modified independence Comments: antalgic gait pattern with left hip external rotation  TREATMENT DATE:     06/11/24                                 EXERCISE LOG    LT hip  Exercise Repetitions and Resistance Comments  Nustep  Lvl 4 x 15 mins   Seated SKTC    Forward Step Ups 6 box  2x 15 reps   Rockerboard 5 mins   Lunges bil 14 box x 3 mins each   STS withUE assist x 10 focus on glutes   Standing Hip Abduction    Standing Hip Extensions    Standing Hip Flexion    Balance SLS eachside with toe touch and tandem st    Blank cell = exercise not performed today   Modalities  Date:  Unattended Estim: Hip, IFC 80-150 Hz, 15 mins, Pain and Tone    PATIENT EDUCATION:  Education details: POC, prognosis, healing, anatomy, objective findings, and goals for physical therapy Person educated: Patient Education method: Explanation Education comprehension: verbalized understanding  HOME EXERCISE PROGRAM:   ASSESSMENT:  CLINICAL IMPRESSION: Pt arrives for today's treatment session reporting 4/10 left hip pain today, referred to her groin area. She was able to continue with LE strengthening exs as well as tandem and SLS with toe touching. Pt did fairly well with exs, but balnce act's were very challenging. HEP to practice balance 4-5 mins 2x daily.    OBJECTIVE IMPAIRMENTS: Abnormal gait, decreased activity tolerance, decreased balance, decreased mobility, difficulty walking, decreased ROM, decreased strength, hypomobility, impaired tone, and pain.    ACTIVITY LIMITATIONS: standing, squatting, sleeping, stairs, transfers, bed mobility, dressing, and locomotion level  PARTICIPATION LIMITATIONS: cleaning, driving, shopping, and community activity  PERSONAL FACTORS: Past/current experiences, Time since onset of injury/illness/exacerbation, and 3+ comorbidities: Hypertension, osteoporosis, anxiety, and depression are also affecting patient's functional outcome.   REHAB POTENTIAL: Good  CLINICAL DECISION MAKING: Evolving/moderate complexity  EVALUATION COMPLEXITY: Moderate   GOALS: Goals reviewed with patient? Yes  SHORT TERM GOALS: Target date: 06/14/24 Patient will be independent with her initial HEP. Baseline: Goal status: INITIAL  2.  Patient will be able to demonstrate at least 65 degrees of left hip flexion for improved function squatting. Baseline:  Goal status: INITIAL  3.  Patient will improve her LEFS score to at least 40/80 for improved perceived function with her daily activities. Baseline:  Goal status: INITIAL  4.  Patient will be able to safely ambulate without an assistive device for at least 80 feet for improved household mobility. Baseline:  Goal status: INITIAL  LONG TERM GOALS: Target date: 07/05/24  Patient will be independent with her advanced HEP. Baseline:  Goal status: INITIAL  2.  Patient will be able to ambulate with minimal to no significant gait deviations for improved community mobility. Baseline:  Goal status: INITIAL  3.  Patient will improve her LEFS score to at least 50/80 for improved perceived function with her daily activities. Baseline:  Goal status: INITIAL  4.  Patient will be able to demonstrate at least 85 degrees of left hip flexion for improved function getting into and out of her car. Baseline:  Goal status: INITIAL  5.  Patient will be able to navigate at least 3 steps with a reciprocal pattern for improved household mobility. Baseline:  Goal status:  INITIAL  PLAN:  PT FREQUENCY: 2x/week  PT DURATION: 6 weeks  PLANNED INTERVENTIONS: 97164- PT Re-evaluation, 97750- Physical Performance Testing, 97110-Therapeutic  exercises, 97530- Therapeutic activity, [id number]- Neuromuscular re-education, [phone]- Self Care, 02859- Manual therapy, [phone]- Gait training, [phone]- Electrical stimulation (unattended), [phone]- Vasopneumatic device, Patient/Family education, Balance training, Stair training, Joint mobilization, Cryotherapy, and Moist heat  PLAN FOR NEXT SESSION: Nustep, gait training, lower extremity strengthening, manual therapy, and modalities as needed   Xitlaly Ault,CHRIS, PTA 06/11/2024, 5:14 PM

## 2024-06-12 DIAGNOSIS — R1310 Dysphagia, unspecified: Secondary | ICD-10-CM | POA: Diagnosis not present

## 2024-06-12 DIAGNOSIS — I2510 Atherosclerotic heart disease of native coronary artery without angina pectoris: Secondary | ICD-10-CM | POA: Diagnosis not present

## 2024-06-14 ENCOUNTER — Ambulatory Visit

## 2024-06-14 DIAGNOSIS — M25652 Stiffness of left hip, not elsewhere classified: Secondary | ICD-10-CM

## 2024-06-14 DIAGNOSIS — R262 Difficulty in walking, not elsewhere classified: Secondary | ICD-10-CM | POA: Diagnosis not present

## 2024-06-14 DIAGNOSIS — S72002D Fracture of unspecified part of neck of left femur, subsequent encounter for closed fracture with routine healing: Secondary | ICD-10-CM | POA: Diagnosis not present

## 2024-06-14 DIAGNOSIS — M6281 Muscle weakness (generalized): Secondary | ICD-10-CM | POA: Diagnosis not present

## 2024-06-14 DIAGNOSIS — M25552 Pain in left hip: Secondary | ICD-10-CM

## 2024-06-14 NOTE — Therapy (Signed)
 OUTPATIENT PHYSICAL THERAPY LOWER EXTREMITY TREATMENT   Patient Name: Jody Wilson MRN: [id number] DOB:07-01-1951, 73 y.o., female Today's Date: 06/14/2024  END OF SESSION:  PT End of Session - 06/14/24 1439     Visit Number 6    Number of Visits 12    Date for PT Re-Evaluation 08/17/24    PT Start Time 1433    PT Stop Time 1514    PT Time Calculation (min) 41 min    Activity Tolerance Patient tolerated treatment well    Behavior During Therapy WFL for tasks assessed/performed           Past Medical History:  Diagnosis Date   Anemia    Anxiety    Aortic atherosclerosis (HCC)    Carotid artery disease (HCC)    Depression    GERD (gastroesophageal reflux disease)    Hyperlipidemia    Hypertension    Orthostatic hypotension    Osteoporosis    Past Surgical History:  Procedure Laterality Date   ABDOMINAL HYSTERECTOMY     BACK SURGERY     CHOLECYSTECTOMY     FOOT SURGERY     7 in the past   HIP ARTHROPLASTY Right 02/11/2023   Procedure: ARTHROPLASTY BIPOLAR HIP (HEMIARTHROPLASTY);  Surgeon: Onesimo Oneil LABOR, MD;  Location: [facility];  Service: Orthopedics;  Laterality: Right;   HIP ARTHROPLASTY Left 04/13/2024   Procedure: HEMIARTHROPLASTY (BIPOLAR) HIP, DIRECT LATERAL  FOR FRACTURE;  Surgeon: Margrette Taft BRAVO, MD;  Location: [facility];  Service: Orthopedics;  Laterality: Left;   IR GENERIC HISTORICAL  12/09/2016   IR RADIOLOGIST EVAL & MGMT 12/09/2016 [facility]   IR GENERIC HISTORICAL  12/10/2016   IR VERTEBROPLASTY CERV/THOR BX INC UNI/BIL INC/INJECT/IMAGING 12/10/2016 [facility]   LEFT HEART CATH AND CORONARY ANGIOGRAPHY N/A 09/30/2020   Procedure: LEFT HEART CATH AND CORONARY ANGIOGRAPHY;  Surgeon: Elmira Newman PARAS, MD;  [facility]: [facility];  Service: Cardiovascular;  Laterality: N/A;   TRACHEOSTOMY TUBE PLACEMENT  1982   Patient Active Problem List   Diagnosis Date Noted   Incontinence of feces 05/08/2024   Closed fracture of neck of left  femur (HCC) 04/13/2024   Hypokalemia 04/13/2024   Closed hip fracture requiring operative repair with routine healing 02/09/2023   Pulmonary nodule 02/09/2023   Hypertriglyceridemia 01/22/2022   Abnormal findings on diagnostic imaging of liver and biliary tract 12/09/2021   Diarrhea 12/09/2021   History of colonic polyps 12/09/2021   Anxiety and depression 06/23/2020   Hiatal hernia 06/23/2020   Thoracic aortic atherosclerosis (HCC) 06/23/2020   Tobacco abuse 04/25/2020   Involuntary muscle jerks while sleeping 09/26/2019   B12 deficiency 09/25/2019   Iron deficiency anemia 09/25/2019   Elevated liver function tests 09/07/2019   Osteoporosis 02/09/2017   History of vertebral fracture 02/09/2017   GERD (gastroesophageal reflux disease) 03/11/2016   Hyperlipidemia with target LDL less than 100 02/19/2015   Hypertension 03/18/2014    PCP: Dettinger, Fonda LABOR, MD  REFERRING PROVIDER: Margrette Taft BRAVO, MD   REFERRING DIAG: Closed fracture of left hip requiring operative repair with routine healing, subsequent encounter   THERAPY DIAG:  Pain in left hip  Stiffness of left hip, not elsewhere classified  Muscle weakness (generalized)  Difficulty in walking, not elsewhere classified  Rationale for Evaluation and Treatment: Rehabilitation  ONSET DATE: 04/13/24  SUBJECTIVE:   SUBJECTIVE STATEMENT: Patient reports that she feels sore and a little weak. However, she is not hurting otherwise.   PERTINENT HISTORY: Hypertension,  osteoporosis, anxiety, and depression PAIN:  Are you having pain? Yes: NPRS scale: 4/10 Pain location: left hip  PRECAUTIONS: Fall  RED FLAGS: None   WEIGHT BEARING RESTRICTIONS: No  FALLS:  Has patient fallen in last 6 months? Yes. Number of falls 2  LIVING ENVIRONMENT: Lives with: lives with their spouse Lives in: House/apartment Stairs: Yes: External: 2 steps; none; step to pattern Has following equipment at home: Single point  cane  OCCUPATION: retired  PLOF: Independent  PATIENT GOALS: improved mobility, be able to get into and out of her car, and be able to walk without a cane  NEXT MD VISIT: 06/28/24  OBJECTIVE:  Note: Objective measures were completed at Evaluation unless otherwise noted.  DIAGNOSTIC FINDINGS: 05/17/24 L hip x-ray Impression stable implant left hip status post bipolar replacement for left hip femoral neck fracture  PATIENT SURVEYS:  LEFS  Extreme difficulty/unable (0), Quite a bit of difficulty (1), Moderate difficulty (2), Little difficulty (3), No difficulty (4) Survey date:  05/24/24  Any of your usual work, housework or school activities 2  2. Usual hobbies, recreational or sporting activities 1  3. Getting into/out of the bath 1  4. Walking between rooms 2  5. Putting on socks/shoes 3  6. Squatting  1  7. Lifting an object, like a bag of groceries from the floor 1  8. Performing light activities around your home 2  9. Performing heavy activities around your home 1  10. Getting into/out of a car 1  11. Walking 2 blocks 1  12. Walking 1 mile 1  13. Going up/down 10 stairs (1 flight) 1  14. Standing for 1 hour 1  15.  sitting for 1 hour 3  16. Running on even ground 1  17. Running on uneven ground 1  18. Making sharp turns while running fast 1  19. Hopping  1  20. Rolling over in bed 1  Score total:  27/80     COGNITION: Overall cognitive status: Within functional limits for tasks assessed     SENSATION: Patient reports no numbness or tingling.   PALPATION: TTP: left hip flexors, TFL, IT band, quadriceps, and gluteals  LOWER EXTREMITY ROM:  Active ROM Right eval Left eval  Hip flexion 93 44; limited by pain  Hip extension    Hip abduction    Hip adduction    Hip internal rotation    Hip external rotation    Knee flexion    Knee extension    Ankle dorsiflexion    Ankle plantarflexion    Ankle inversion    Ankle eversion     (Blank rows = not  tested)  LOWER EXTREMITY MMT:  MMT Right eval Left eval  Hip flexion 3+/5 3/5; familiar pain  Hip extension    Hip abduction    Hip adduction    Hip internal rotation    Hip external rotation    Knee flexion 4/5 3/5  Knee extension 4+/5 3+/5; limited by familiar pain  Ankle dorsiflexion    Ankle plantarflexion    Ankle inversion    Ankle eversion     (Blank rows = not tested)  LOWER EXTREMITY SPECIAL TESTS:  Not tested due to surgical condition  GAIT: Assistive device utilized: Single point cane Level of assistance: Modified independence Comments: antalgic gait pattern with left hip external rotation  TREATMENT DATE:                                     06/14/24 EXERCISE LOG  Exercise Repetitions and Resistance Comments  Nustep  L5 x 15.5 minutes   Squatting  35 reps  BUE support   Standing hip ABD  20 reps each    Lower trunk rotation 3 minutes   Bridge  20 reps   SLR  Attempted, but limited by pain   Supine hip ADD isometric  2.5 minutes w/ 5 second hold    Supine march  20 reps each        Blank cell = exercise not performed today    06/11/24                                 EXERCISE LOG    LT hip  Exercise Repetitions and Resistance Comments  Nustep  Lvl 4 x 15 mins   Seated SKTC    Forward Step Ups 6 box  2x 15 reps   Rockerboard 5 mins   Lunges bil 14 box x 3 mins each   STS withUE assist x 10 focus on glutes   Standing Hip Abduction    Standing Hip Extensions    Standing Hip Flexion    Balance SLS eachside with toe touch and tandem st    Blank cell = exercise not performed today   Modalities  Date:  Unattended Estim: Hip, IFC 80-150 Hz, 15 mins, Pain and Tone    PATIENT EDUCATION:  Education details: HEP  Person educated: Patient Education method: Explanation Education comprehension: verbalized understanding  HOME  EXERCISE PROGRAM: [id number]  ASSESSMENT:  CLINICAL IMPRESSION: Patient was introduced to multiple new supine interventions for improved functional mobility needed prior to getting out of bed in the morning. She required minimal cueing with today's new interventions for proper exercise performance. She experienced increase left hip pain with straight leg raises. However, this did not persist into today's other interventions. She reported feeling alright upon the conclusion of treatment. She continues to require skilled physical therapy to address her remaining impairments to return to her prior level of function.    OBJECTIVE IMPAIRMENTS: Abnormal gait, decreased activity tolerance, decreased balance, decreased mobility, difficulty walking, decreased ROM, decreased strength, hypomobility, impaired tone, and pain.   ACTIVITY LIMITATIONS: standing, squatting, sleeping, stairs, transfers, bed mobility, dressing, and locomotion level  PARTICIPATION LIMITATIONS: cleaning, driving, shopping, and community activity  PERSONAL FACTORS: Past/current experiences, Time since onset of injury/illness/exacerbation, and 3+ comorbidities: Hypertension, osteoporosis, anxiety, and depression are also affecting patient's functional outcome.   REHAB POTENTIAL: Good  CLINICAL DECISION MAKING: Evolving/moderate complexity  EVALUATION COMPLEXITY: Moderate   GOALS: Goals reviewed with patient? Yes  SHORT TERM GOALS: Target date: 06/14/24 Patient will be independent with her initial HEP. Baseline: Goal status: INITIAL  2.  Patient will be able to demonstrate at least 65 degrees of left hip flexion for improved function squatting. Baseline:  Goal status: INITIAL  3.  Patient will improve her LEFS score to at least 40/80 for improved perceived function with her daily activities. Baseline:  Goal status: INITIAL  4.  Patient will be able to safely ambulate without an assistive device for at least 80 feet for  improved household mobility. Baseline:  Goal status: INITIAL  LONG TERM GOALS: Target date:  07/05/24  Patient will be independent with her advanced HEP. Baseline:  Goal status: INITIAL  2.  Patient will be able to ambulate with minimal to no significant gait deviations for improved community mobility. Baseline:  Goal status: INITIAL  3.  Patient will improve her LEFS score to at least 50/80 for improved perceived function with her daily activities. Baseline:  Goal status: INITIAL  4.  Patient will be able to demonstrate at least 85 degrees of left hip flexion for improved function getting into and out of her car. Baseline:  Goal status: INITIAL  5.  Patient will be able to navigate at least 3 steps with a reciprocal pattern for improved household mobility. Baseline:  Goal status: INITIAL  PLAN:  PT FREQUENCY: 2x/week  PT DURATION: 6 weeks  PLANNED INTERVENTIONS: 02835- PT Re-evaluation, 97750- Physical Performance Testing, 97110-Therapeutic exercises, 97530- Therapeutic activity, 97112- Neuromuscular re-education, 97535- Self Care, 02859- Manual therapy, [phone]- Gait training, [phone]- Electrical stimulation (unattended), 97016- Vasopneumatic device, Patient/Family education, Balance training, Stair training, Joint mobilization, Cryotherapy, and Moist heat  PLAN FOR NEXT SESSION: Nustep, gait training, lower extremity strengthening, manual therapy, and modalities as needed   Lacinda JAYSON Fass, PT 06/14/2024, 3:33 PM

## 2024-06-18 ENCOUNTER — Ambulatory Visit

## 2024-06-18 ENCOUNTER — Other Ambulatory Visit: Payer: Self-pay | Admitting: Family Medicine

## 2024-06-18 DIAGNOSIS — M25652 Stiffness of left hip, not elsewhere classified: Secondary | ICD-10-CM

## 2024-06-18 DIAGNOSIS — R262 Difficulty in walking, not elsewhere classified: Secondary | ICD-10-CM

## 2024-06-18 DIAGNOSIS — S72002D Fracture of unspecified part of neck of left femur, subsequent encounter for closed fracture with routine healing: Secondary | ICD-10-CM | POA: Diagnosis not present

## 2024-06-18 DIAGNOSIS — M6281 Muscle weakness (generalized): Secondary | ICD-10-CM | POA: Diagnosis not present

## 2024-06-18 DIAGNOSIS — F419 Anxiety disorder, unspecified: Secondary | ICD-10-CM

## 2024-06-18 DIAGNOSIS — M25552 Pain in left hip: Secondary | ICD-10-CM | POA: Diagnosis not present

## 2024-06-18 NOTE — Telephone Encounter (Signed)
 Copied from CRM [id number]. Topic: Clinical - Medication Refill >> Jun 18, 2024  1:35 PM Essie A wrote: Medication: ALPRAZolam  (XANAX ) 0.5 MG tablet  Has the patient contacted their pharmacy? Yes (Agent: If no, request that the patient contact the pharmacy for the refill. If patient does not wish to contact the pharmacy document the reason why and proceed with request.) (Agent: If yes, when and what did the pharmacy advise?)  This is the patient's preferred pharmacy:  [facility] [place], [place] - [address] [place] [address] Phone: [phone] Fax: [phone]  Is this the correct pharmacy for this prescription? Yes If no, delete pharmacy and type the correct one.   Has the prescription been filled recently? Yes  Is the patient out of the medication? Yes, have 3 pills left  Has the patient been seen for an appointment in the last year OR does the patient have an upcoming appointment? Yes  Can we respond through MyChart? No  Agent: Please be advised that Rx refills may take up to 3 business days. We ask that you follow-up with your pharmacy.

## 2024-06-18 NOTE — Therapy (Signed)
 OUTPATIENT PHYSICAL THERAPY LOWER EXTREMITY TREATMENT   Patient Name: Jody Wilson MRN: [id number] DOB:January 21, 1951, 73 y.o., female Today's Date: 06/18/2024  END OF SESSION:  PT End of Session - 06/18/24 1435     Visit Number 7    Number of Visits 12    Date for PT Re-Evaluation 08/17/24    PT Start Time 1433    Activity Tolerance Patient tolerated treatment well    Behavior During Therapy [facility] for tasks assessed/performed           Past Medical History:  Diagnosis Date   Anemia    Anxiety    Aortic atherosclerosis (HCC)    Carotid artery disease (HCC)    Depression    GERD (gastroesophageal reflux disease)    Hyperlipidemia    Hypertension    Orthostatic hypotension    Osteoporosis    Past Surgical History:  Procedure Laterality Date   ABDOMINAL HYSTERECTOMY     BACK SURGERY     CHOLECYSTECTOMY     FOOT SURGERY     7 in the past   HIP ARTHROPLASTY Right 02/11/2023   Procedure: ARTHROPLASTY BIPOLAR HIP (HEMIARTHROPLASTY);  Surgeon: Onesimo Oneil LABOR, MD;  Location: [facility];  Service: Orthopedics;  Laterality: Right;   HIP ARTHROPLASTY Left 04/13/2024   Procedure: HEMIARTHROPLASTY (BIPOLAR) HIP, DIRECT LATERAL  FOR FRACTURE;  Surgeon: Margrette Taft BRAVO, MD;  Location: [facility];  Service: Orthopedics;  Laterality: Left;   IR GENERIC HISTORICAL  12/09/2016   IR RADIOLOGIST EVAL & MGMT 12/09/2016 [facility]   IR GENERIC HISTORICAL  12/10/2016   IR VERTEBROPLASTY CERV/THOR BX INC UNI/BIL INC/INJECT/IMAGING 12/10/2016 [facility]   LEFT HEART CATH AND CORONARY ANGIOGRAPHY N/A 09/30/2020   Procedure: LEFT HEART CATH AND CORONARY ANGIOGRAPHY;  Surgeon: Elmira Newman PARAS, MD;  [facility];  Service: Cardiovascular;  Laterality: N/A;   TRACHEOSTOMY TUBE PLACEMENT  1982   Patient Active Problem List   Diagnosis Date Noted   Incontinence of feces 05/08/2024   Closed fracture of neck of left femur (HCC) 04/13/2024   Hypokalemia 04/13/2024   Closed  hip fracture requiring operative repair with routine healing 02/09/2023   Pulmonary nodule 02/09/2023   Hypertriglyceridemia 01/22/2022   Abnormal findings on diagnostic imaging of liver and biliary tract 12/09/2021   Diarrhea 12/09/2021   History of colonic polyps 12/09/2021   Anxiety and depression 06/23/2020   Hiatal hernia 06/23/2020   Thoracic aortic atherosclerosis (HCC) 06/23/2020   Tobacco abuse 04/25/2020   Involuntary muscle jerks while sleeping 09/26/2019   B12 deficiency 09/25/2019   Iron deficiency anemia 09/25/2019   Elevated liver function tests 09/07/2019   Osteoporosis 02/09/2017   History of vertebral fracture 02/09/2017   GERD (gastroesophageal reflux disease) 03/11/2016   Hyperlipidemia with target LDL less than 100 02/19/2015   Hypertension 03/18/2014    PCP: Dettinger, Fonda LABOR, MD  REFERRING PROVIDER: Margrette Taft BRAVO, MD   REFERRING DIAG: Closed fracture of left hip requiring operative repair with routine healing, subsequent encounter   THERAPY DIAG:  Pain in left hip  Stiffness of left hip, not elsewhere classified  Muscle weakness (generalized)  Difficulty in walking, not elsewhere classified  Rationale for Evaluation and Treatment: Rehabilitation  ONSET DATE: 04/13/24  SUBJECTIVE:   SUBJECTIVE STATEMENT: Patient reports 1/10 left hip pain today.    PERTINENT HISTORY: Hypertension, osteoporosis, anxiety, and depression PAIN:  Are you having pain? Yes: NPRS scale: 1/10 Pain location: left hip  PRECAUTIONS: Fall  RED FLAGS:  None   WEIGHT BEARING RESTRICTIONS: No  FALLS:  Has patient fallen in last 6 months? Yes. Number of falls 2  LIVING ENVIRONMENT: Lives with: lives with their spouse Lives in: House/apartment Stairs: Yes: External: 2 steps; none; step to pattern Has following equipment at home: Single point cane  OCCUPATION: retired  PLOF: Independent  PATIENT GOALS: improved mobility, be able to get into and out of  her car, and be able to walk without a cane  NEXT MD VISIT: 06/28/24  OBJECTIVE:  Note: Objective measures were completed at Evaluation unless otherwise noted.  DIAGNOSTIC FINDINGS: 05/17/24 L hip x-ray Impression stable implant left hip status post bipolar replacement for left hip femoral neck fracture  PATIENT SURVEYS:  LEFS  Extreme difficulty/unable (0), Quite a bit of difficulty (1), Moderate difficulty (2), Little difficulty (3), No difficulty (4) Survey date:  05/24/24  Any of your usual work, housework or school activities 2  2. Usual hobbies, recreational or sporting activities 1  3. Getting into/out of the bath 1  4. Walking between rooms 2  5. Putting on socks/shoes 3  6. Squatting  1  7. Lifting an object, like a bag of groceries from the floor 1  8. Performing light activities around your home 2  9. Performing heavy activities around your home 1  10. Getting into/out of a car 1  11. Walking 2 blocks 1  12. Walking 1 mile 1  13. Going up/down 10 stairs (1 flight) 1  14. Standing for 1 hour 1  15.  sitting for 1 hour 3  16. Running on even ground 1  17. Running on uneven ground 1  18. Making sharp turns while running fast 1  19. Hopping  1  20. Rolling over in bed 1  Score total:  27/80     COGNITION: Overall cognitive status: Within functional limits for tasks assessed     SENSATION: Patient reports no numbness or tingling.   PALPATION: TTP: left hip flexors, TFL, IT band, quadriceps, and gluteals  LOWER EXTREMITY ROM:  Active ROM Right eval Left eval  Hip flexion 93 44; limited by pain  Hip extension    Hip abduction    Hip adduction    Hip internal rotation    Hip external rotation    Knee flexion    Knee extension    Ankle dorsiflexion    Ankle plantarflexion    Ankle inversion    Ankle eversion     (Blank rows = not tested)  LOWER EXTREMITY MMT:  MMT Right eval Left eval  Hip flexion 3+/5 3/5; familiar pain  Hip extension    Hip  abduction    Hip adduction    Hip internal rotation    Hip external rotation    Knee flexion 4/5 3/5  Knee extension 4+/5 3+/5; limited by familiar pain  Ankle dorsiflexion    Ankle plantarflexion    Ankle inversion    Ankle eversion     (Blank rows = not tested)  LOWER EXTREMITY SPECIAL TESTS:  Not tested due to surgical condition  GAIT: Assistive device utilized: Single point cane Level of assistance: Modified independence Comments: antalgic gait pattern with left hip external rotation  TREATMENT DATE:    06/14/24     EXERCISE LOG  Exercise Repetitions and Resistance Comments  Nustep  L5 x 16 minutes   Cybex Knee Flexion 30# x 4 mins   Cybex Knee Extension 10# x 4 mins   Goal Assessment  See Below   Stairs 4 step x 3 reps   Squatting   BUE support   Standing hip ABD     Lower trunk rotation    Bridge     SLR     Supine hip ADD isometric     Supine march          Blank cell = exercise not performed today    06/11/24                                 EXERCISE LOG    LT hip  Exercise Repetitions and Resistance Comments  Nustep  Lvl 4 x 15 mins   Seated SKTC    Forward Step Ups 6 box  2x 15 reps   Rockerboard 5 mins   Lunges bil 14 box x 3 mins each   STS withUE assist x 10 focus on glutes   Standing Hip Abduction    Standing Hip Extensions    Standing Hip Flexion    Balance SLS eachside with toe touch and tandem st    Blank cell = exercise not performed today   Modalities  Date:  Unattended Estim: Hip, IFC 80-150 Hz, 15 mins, Pain and Tone    PATIENT EDUCATION:  Education details: HEP  Person educated: Patient Education method: Explanation Education comprehension: verbalized understanding  HOME EXERCISE PROGRAM: [id number]  ASSESSMENT:  CLINICAL IMPRESSION: Pt arrives for today's treatment session reporting 1/10 left hip  pain.  Pt able to demonstrate 94 degrees of active left hip flexion, meeting bother her short and long term goal.  Pt able to ambulate 157 ft without use of AD and minimal antalgic gait.  Pt also able to navigate four steps with reciprocal patter and use of one hand rail.  Pt demonstrating increased difficulty with descending, but able to perform all reps safely.  Pt is making good progress towards all of her goals at this time.  Pt introduced to cybex knee flexion and extension today with good results.  Pt denied any pain at completion of today's treatment session.   OBJECTIVE IMPAIRMENTS: Abnormal gait, decreased activity tolerance, decreased balance, decreased mobility, difficulty walking, decreased ROM, decreased strength, hypomobility, impaired tone, and pain.   ACTIVITY LIMITATIONS: standing, squatting, sleeping, stairs, transfers, bed mobility, dressing, and locomotion level  PARTICIPATION LIMITATIONS: cleaning, driving, shopping, and community activity  PERSONAL FACTORS: Past/current experiences, Time since onset of injury/illness/exacerbation, and 3+ comorbidities: Hypertension, osteoporosis, anxiety, and depression are also affecting patient's functional outcome.   REHAB POTENTIAL: Good  CLINICAL DECISION MAKING: Evolving/moderate complexity  EVALUATION COMPLEXITY: Moderate   GOALS: Goals reviewed with patient? Yes  SHORT TERM GOALS: Target date: 06/14/24 Patient will be independent with her initial HEP. Baseline: Goal status: MET  2.  Patient will be able to demonstrate at least 65 degrees of left hip flexion for improved function squatting. Baseline: 94 degrees Goal status: INITIAL  3.  Patient will improve her LEFS score to at least 40/80 for improved perceived function with her daily activities. Baseline: 27/80  Goal status: IN PROGRESS  4.  Patient will be able to safely ambulate without an assistive device for  at least 80 feet for improved household  mobility. Baseline: 157 ft no AD Goal status: MET  LONG TERM GOALS: Target date: 07/05/24  Patient will be independent with her advanced HEP. Baseline:  Goal status: IN PROGRESS  2.  Patient will be able to ambulate with minimal to no significant gait deviations for improved community mobility. Baseline:  Goal status: IN PROGRESS  3.  Patient will improve her LEFS score to at least 50/80 for improved perceived function with her daily activities. Baseline:  Goal status: IN PROGRESS  4.  Patient will be able to demonstrate at least 85 degrees of left hip flexion for improved function getting into and out of her car. Baseline: 94 degrees Goal status: MET  5.  Patient will be able to navigate at least 3 steps with a reciprocal pattern for improved household mobility. Baseline:  Goal status: MET  PLAN:  PT FREQUENCY: 2x/week  PT DURATION: 6 weeks  PLANNED INTERVENTIONS: 02835- PT Re-evaluation, 97750- Physical Performance Testing, 97110-Therapeutic exercises, 97530- Therapeutic activity, 97112- Neuromuscular re-education, 97535- Self Care, 02859- Manual therapy, [phone]- Gait training, [phone]- Electrical stimulation (unattended), 97016- Vasopneumatic device, Patient/Family education, Balance training, Stair training, Joint mobilization, Cryotherapy, and Moist heat  PLAN FOR NEXT SESSION: Nustep, gait training, lower extremity strengthening, manual therapy, and modalities as needed   Delon DELENA Gosling, PTA 06/18/2024, 3:33 PM

## 2024-06-19 ENCOUNTER — Encounter

## 2024-06-20 NOTE — Telephone Encounter (Signed)
 LM[facility] refill request was for Alprazolam , this was not originally prescribed by her PCP and was not discussed during her visit on 05/23/24. This is a controlled medication that has to be filled during a visit. Please call the office to make an appointment w/ her PCP or this will have to wait till her visit on 08/23/24.

## 2024-06-21 ENCOUNTER — Ambulatory Visit: Attending: Orthopedic Surgery

## 2024-06-21 DIAGNOSIS — M6281 Muscle weakness (generalized): Secondary | ICD-10-CM | POA: Insufficient documentation

## 2024-06-21 DIAGNOSIS — R262 Difficulty in walking, not elsewhere classified: Secondary | ICD-10-CM | POA: Diagnosis not present

## 2024-06-21 DIAGNOSIS — M25552 Pain in left hip: Secondary | ICD-10-CM | POA: Diagnosis not present

## 2024-06-21 DIAGNOSIS — M25652 Stiffness of left hip, not elsewhere classified: Secondary | ICD-10-CM | POA: Insufficient documentation

## 2024-06-21 NOTE — Therapy (Signed)
 OUTPATIENT PHYSICAL THERAPY LOWER EXTREMITY TREATMENT   Patient Name: Jody Wilson MRN: [id number] DOB:[birth year], 73 y.o., female Today's Date: 06/21/2024  END OF SESSION:  PT End of Session - 06/21/24 1436     Visit Number 8    Number of Visits 12    Date for PT Re-Evaluation 08/17/24    PT Start Time 1430    PT Stop Time 1520    PT Time Calculation (min) 50 min    Activity Tolerance Patient tolerated treatment well    Behavior During Therapy WFL for tasks assessed/performed           Past Medical History:  Diagnosis Date   Anemia    Anxiety    Aortic atherosclerosis (HCC)    Carotid artery disease (HCC)    Depression    GERD (gastroesophageal reflux disease)    Hyperlipidemia    Hypertension    Orthostatic hypotension    Osteoporosis    Past Surgical History:  Procedure Laterality Date   ABDOMINAL HYSTERECTOMY     BACK SURGERY     CHOLECYSTECTOMY     FOOT SURGERY     7 in the past   HIP ARTHROPLASTY Right 02/11/2023   Procedure: ARTHROPLASTY BIPOLAR HIP (HEMIARTHROPLASTY);  Surgeon: Onesimo Oneil LABOR, MD;  Location: [facility];  Service: Orthopedics;  Laterality: Right;   HIP ARTHROPLASTY Left 04/13/2024   Procedure: HEMIARTHROPLASTY (BIPOLAR) HIP, DIRECT LATERAL  FOR FRACTURE;  Surgeon: Margrette Taft BRAVO, MD;  Location: [facility];  Service: Orthopedics;  Laterality: Left;   IR GENERIC HISTORICAL  12/09/2016   IR RADIOLOGIST EVAL & MGMT 12/09/2016 [facility]   IR GENERIC HISTORICAL  12/10/2016   IR VERTEBROPLASTY CERV/THOR BX INC UNI/BIL INC/INJECT/IMAGING 12/10/2016 [facility]   LEFT HEART CATH AND CORONARY ANGIOGRAPHY N/A 09/30/2020   Procedure: LEFT HEART CATH AND CORONARY ANGIOGRAPHY;  Surgeon: Elmira Newman PARAS, MD;  Location: [facility];  Service: Cardiovascular;  Laterality: N/A;   TRACHEOSTOMY TUBE PLACEMENT  1982   Patient Active Problem List   Diagnosis Date Noted   Incontinence of feces 05/08/2024   Closed fracture of neck of left  femur (HCC) 04/13/2024   Hypokalemia 04/13/2024   Closed hip fracture requiring operative repair with routine healing 02/09/2023   Pulmonary nodule 02/09/2023   Hypertriglyceridemia 01/22/2022   Abnormal findings on diagnostic imaging of liver and biliary tract 12/09/2021   Diarrhea 12/09/2021   History of colonic polyps 12/09/2021   Anxiety and depression 06/23/2020   Hiatal hernia 06/23/2020   Thoracic aortic atherosclerosis (HCC) 06/23/2020   Tobacco abuse 04/25/2020   Involuntary muscle jerks while sleeping 09/26/2019   B12 deficiency 09/25/2019   Iron deficiency anemia 09/25/2019   Elevated liver function tests 09/07/2019   Osteoporosis 02/09/2017   History of vertebral fracture 02/09/2017   GERD (gastroesophageal reflux disease) 03/11/2016   Hyperlipidemia with target LDL less than 100 02/19/2015   Hypertension 03/18/2014    PCP: Dettinger, Fonda LABOR, MD  REFERRING PROVIDER: Margrette Taft BRAVO, MD   REFERRING DIAG: Closed fracture of left hip requiring operative repair with routine healing, subsequent encounter   THERAPY DIAG:  Pain in left hip  Stiffness of left hip, not elsewhere classified  Muscle weakness (generalized)  Difficulty in walking, not elsewhere classified  Rationale for Evaluation and Treatment: Rehabilitation  ONSET DATE: 04/13/24  SUBJECTIVE:   SUBJECTIVE STATEMENT: Patient reports 1/10 left hip pain today.    PERTINENT HISTORY: Hypertension, osteoporosis, anxiety, and depression PAIN:  Are you  having pain? Yes: NPRS scale: 1/10 Pain location: left hip  PRECAUTIONS: Fall  RED FLAGS: None   WEIGHT BEARING RESTRICTIONS: No  FALLS:  Has patient fallen in last 6 months? Yes. Number of falls 2  LIVING ENVIRONMENT: Lives with: lives with their spouse Lives in: House/apartment Stairs: Yes: External: 2 steps; none; step to pattern Has following equipment at home: Single point cane  OCCUPATION: retired  PLOF: Independent  PATIENT  GOALS: improved mobility, be able to get into and out of her car, and be able to walk without a cane  NEXT MD VISIT: 06/28/24  OBJECTIVE:  Note: Objective measures were completed at Evaluation unless otherwise noted.  DIAGNOSTIC FINDINGS: 05/17/24 L hip x-ray Impression stable implant left hip status post bipolar replacement for left hip femoral neck fracture  PATIENT SURVEYS:  LEFS  Extreme difficulty/unable (0), Quite a bit of difficulty (1), Moderate difficulty (2), Little difficulty (3), No difficulty (4) Survey date:  05/24/24  Any of your usual work, housework or school activities 2  2. Usual hobbies, recreational or sporting activities 1  3. Getting into/out of the bath 1  4. Walking between rooms 2  5. Putting on socks/shoes 3  6. Squatting  1  7. Lifting an object, like a bag of groceries from the floor 1  8. Performing light activities around your home 2  9. Performing heavy activities around your home 1  10. Getting into/out of a car 1  11. Walking 2 blocks 1  12. Walking 1 mile 1  13. Going up/down 10 stairs (1 flight) 1  14. Standing for 1 hour 1  15.  sitting for 1 hour 3  16. Running on even ground 1  17. Running on uneven ground 1  18. Making sharp turns while running fast 1  19. Hopping  1  20. Rolling over in bed 1  Score total:  27/80     COGNITION: Overall cognitive status: Within functional limits for tasks assessed     SENSATION: Patient reports no numbness or tingling.   PALPATION: TTP: left hip flexors, TFL, IT band, quadriceps, and gluteals  LOWER EXTREMITY ROM:  Active ROM Right eval Left eval  Hip flexion 93 44; limited by pain  Hip extension    Hip abduction    Hip adduction    Hip internal rotation    Hip external rotation    Knee flexion    Knee extension    Ankle dorsiflexion    Ankle plantarflexion    Ankle inversion    Ankle eversion     (Blank rows = not tested)  LOWER EXTREMITY MMT:  MMT Right eval Left eval  Hip  flexion 3+/5 3/5; familiar pain  Hip extension    Hip abduction    Hip adduction    Hip internal rotation    Hip external rotation    Knee flexion 4/5 3/5  Knee extension 4+/5 3+/5; limited by familiar pain  Ankle dorsiflexion    Ankle plantarflexion    Ankle inversion    Ankle eversion     (Blank rows = not tested)  LOWER EXTREMITY SPECIAL TESTS:  Not tested due to surgical condition  GAIT: Assistive device utilized: Single point cane Level of assistance: Modified independence Comments: antalgic gait pattern with left hip external rotation  TREATMENT DATE:    06/21/24     EXERCISE LOG  Exercise Repetitions and Resistance Comments  Nustep  L5 x 16 minutes   Cybex Knee Flexion 30# x 4 mins   Cybex Knee Extension 10# x 4 mins   Leg Press 2 plates; seat 7; 4 mins   STS 5 reps , no UE support   Stairs 4 step x 3 reps   Squatting   BUE support   Standing hip ABD     Lower trunk rotation    Bridge     SLR     Supine hip ADD isometric     Supine march          Blank cell = exercise not performed today    06/11/24                                 EXERCISE LOG    LT hip  Exercise Repetitions and Resistance Comments  Nustep  Lvl 4 x 15 mins   Seated SKTC    Forward Step Ups 6 box  2x 15 reps   Rockerboard 5 mins   Lunges bil 14 box x 3 mins each   STS withUE assist x 10 focus on glutes   Standing Hip Abduction    Standing Hip Extensions    Standing Hip Flexion    Balance SLS eachside with toe touch and tandem st    Blank cell = exercise not performed today   Modalities  Date:  Unattended Estim: Hip, IFC 80-150 Hz, 15 mins, Pain and Tone    PATIENT EDUCATION:  Education details: HEP  Person educated: Patient Education method: Explanation Education comprehension: verbalized understanding  HOME EXERCISE  PROGRAM: [id number]  ASSESSMENT:  CLINICAL IMPRESSION: Pt arrives for today's treatment session reporting 2-3/10 left hip pain.  Pt able to tolerate increased time with cybex knee flexion and extension today.  Pt introduced to cybex leg press with min cues required for eccentric control.  Pt challenged by STS transfers, but able to perform all reps without use of UE support.  Pt asking questions regarding safe sexual positioning within her surgical precautions.  Pt educated on appropriate positioning to avoid reinjury and within her surgical precautions.  Pt verbalized understanding.  Pt reported decreased pain at completion of today's treatment session.   OBJECTIVE IMPAIRMENTS: Abnormal gait, decreased activity tolerance, decreased balance, decreased mobility, difficulty walking, decreased ROM, decreased strength, hypomobility, impaired tone, and pain.   ACTIVITY LIMITATIONS: standing, squatting, sleeping, stairs, transfers, bed mobility, dressing, and locomotion level  PARTICIPATION LIMITATIONS: cleaning, driving, shopping, and community activity  PERSONAL FACTORS: Past/current experiences, Time since onset of injury/illness/exacerbation, and 3+ comorbidities: Hypertension, osteoporosis, anxiety, and depression are also affecting patient's functional outcome.   REHAB POTENTIAL: Good  CLINICAL DECISION MAKING: Evolving/moderate complexity  EVALUATION COMPLEXITY: Moderate   GOALS: Goals reviewed with patient? Yes  SHORT TERM GOALS: Target date: 06/14/24 Patient will be independent with her initial HEP. Baseline: Goal status: MET  2.  Patient will be able to demonstrate at least 65 degrees of left hip flexion for improved function squatting. Baseline: 94 degrees Goal status: INITIAL  3.  Patient will improve her LEFS score to at least 40/80 for improved perceived function with her daily activities. Baseline: 27/80  Goal status: IN PROGRESS  4.  Patient will be able to safely  ambulate without an assistive device for at least 80 feet for  improved household mobility. Baseline: 157 ft no AD Goal status: MET  LONG TERM GOALS: Target date: 07/05/24  Patient will be independent with her advanced HEP. Baseline:  Goal status: IN PROGRESS  2.  Patient will be able to ambulate with minimal to no significant gait deviations for improved community mobility. Baseline:  Goal status: IN PROGRESS  3.  Patient will improve her LEFS score to at least 50/80 for improved perceived function with her daily activities. Baseline:  Goal status: IN PROGRESS  4.  Patient will be able to demonstrate at least 85 degrees of left hip flexion for improved function getting into and out of her car. Baseline: 94 degrees Goal status: MET  5.  Patient will be able to navigate at least 3 steps with a reciprocal pattern for improved household mobility. Baseline:  Goal status: MET  PLAN:  PT FREQUENCY: 2x/week  PT DURATION: 6 weeks  PLANNED INTERVENTIONS: 02835- PT Re-evaluation, 97750- Physical Performance Testing, 97110-Therapeutic exercises, 97530- Therapeutic activity, 97112- Neuromuscular re-education, 97535- Self Care, 02859- Manual therapy, [phone]- Gait training, [phone]- Electrical stimulation (unattended), 97016- Vasopneumatic device, Patient/Family education, Balance training, Stair training, Joint mobilization, Cryotherapy, and Moist heat  PLAN FOR NEXT SESSION: Nustep, gait training, lower extremity strengthening, manual therapy, and modalities as needed   Delon DELENA Gosling, PTA 06/21/2024, 4:40 PM

## 2024-06-26 ENCOUNTER — Emergency Department (HOSPITAL_COMMUNITY)

## 2024-06-26 ENCOUNTER — Observation Stay (HOSPITAL_COMMUNITY)
Admission: EM | Admit: 2024-06-26 | Discharge: 2024-06-29 | Disposition: A | Discharge status: Home / Self Care | Attending: Internal Medicine | Admitting: Internal Medicine

## 2024-06-26 ENCOUNTER — Telehealth: Payer: Self-pay | Admitting: Family Medicine

## 2024-06-26 ENCOUNTER — Encounter

## 2024-06-26 ENCOUNTER — Encounter (HOSPITAL_COMMUNITY): Payer: Self-pay | Admitting: Emergency Medicine

## 2024-06-26 ENCOUNTER — Other Ambulatory Visit: Payer: Self-pay

## 2024-06-26 DIAGNOSIS — E785 Hyperlipidemia, unspecified: Secondary | ICD-10-CM | POA: Diagnosis not present

## 2024-06-26 DIAGNOSIS — Z87891 Personal history of nicotine dependence: Secondary | ICD-10-CM | POA: Diagnosis not present

## 2024-06-26 DIAGNOSIS — Z96643 Presence of artificial hip joint, bilateral: Secondary | ICD-10-CM | POA: Insufficient documentation

## 2024-06-26 DIAGNOSIS — F419 Anxiety disorder, unspecified: Secondary | ICD-10-CM | POA: Diagnosis present

## 2024-06-26 DIAGNOSIS — Z043 Encounter for examination and observation following other accident: Secondary | ICD-10-CM | POA: Diagnosis not present

## 2024-06-26 DIAGNOSIS — Z79899 Other long term (current) drug therapy: Secondary | ICD-10-CM | POA: Diagnosis not present

## 2024-06-26 DIAGNOSIS — R7989 Other specified abnormal findings of blood chemistry: Secondary | ICD-10-CM | POA: Diagnosis not present

## 2024-06-26 DIAGNOSIS — I499 Cardiac arrhythmia, unspecified: Secondary | ICD-10-CM | POA: Diagnosis not present

## 2024-06-26 DIAGNOSIS — K449 Diaphragmatic hernia without obstruction or gangrene: Secondary | ICD-10-CM | POA: Diagnosis not present

## 2024-06-26 DIAGNOSIS — R410 Disorientation, unspecified: Secondary | ICD-10-CM | POA: Diagnosis not present

## 2024-06-26 DIAGNOSIS — F32A Depression, unspecified: Secondary | ICD-10-CM | POA: Diagnosis not present

## 2024-06-26 DIAGNOSIS — S199XXA Unspecified injury of neck, initial encounter: Secondary | ICD-10-CM | POA: Diagnosis not present

## 2024-06-26 DIAGNOSIS — I214 Non-ST elevation (NSTEMI) myocardial infarction: Secondary | ICD-10-CM

## 2024-06-26 DIAGNOSIS — E876 Hypokalemia: Secondary | ICD-10-CM | POA: Diagnosis not present

## 2024-06-26 DIAGNOSIS — I10 Essential (primary) hypertension: Secondary | ICD-10-CM | POA: Diagnosis present

## 2024-06-26 DIAGNOSIS — Z7982 Long term (current) use of aspirin: Secondary | ICD-10-CM | POA: Insufficient documentation

## 2024-06-26 DIAGNOSIS — R55 Syncope and collapse: Secondary | ICD-10-CM | POA: Diagnosis not present

## 2024-06-26 DIAGNOSIS — S0990XA Unspecified injury of head, initial encounter: Secondary | ICD-10-CM | POA: Diagnosis not present

## 2024-06-26 DIAGNOSIS — I6523 Occlusion and stenosis of bilateral carotid arteries: Secondary | ICD-10-CM | POA: Diagnosis not present

## 2024-06-26 DIAGNOSIS — J9601 Acute respiratory failure with hypoxia: Secondary | ICD-10-CM | POA: Diagnosis not present

## 2024-06-26 DIAGNOSIS — R0689 Other abnormalities of breathing: Secondary | ICD-10-CM | POA: Diagnosis not present

## 2024-06-26 DIAGNOSIS — R0902 Hypoxemia: Secondary | ICD-10-CM

## 2024-06-26 DIAGNOSIS — I700 Atherosclerosis of aorta: Secondary | ICD-10-CM | POA: Diagnosis not present

## 2024-06-26 DIAGNOSIS — R932 Abnormal findings on diagnostic imaging of liver and biliary tract: Secondary | ICD-10-CM | POA: Insufficient documentation

## 2024-06-26 DIAGNOSIS — Z981 Arthrodesis status: Secondary | ICD-10-CM | POA: Diagnosis not present

## 2024-06-26 LAB — CBC WITH DIFFERENTIAL/PLATELET
Abs Immature Granulocytes: 0 K/uL (ref 0.00–0.07)
Basophils Absolute: 0 K/uL (ref 0.0–0.1)
Basophils Relative: 0 %
Eosinophils Absolute: 0.1 K/uL (ref 0.0–0.5)
Eosinophils Relative: 1 %
HCT: 41.2 % (ref 36.0–46.0)
Hemoglobin: 14 g/dL (ref 12.0–15.0)
Lymphocytes Relative: 17 %
Lymphs Abs: 1.6 K/uL (ref 0.7–4.0)
MCH: 33.2 pg (ref 26.0–34.0)
MCHC: 34 g/dL (ref 30.0–36.0)
MCV: 97.6 fL (ref 80.0–100.0)
Monocytes Absolute: 0.3 K/uL (ref 0.1–1.0)
Monocytes Relative: 3 %
Neutro Abs: 7.3 K/uL (ref 1.7–7.7)
Neutrophils Relative %: 79 %
Platelets: 254 K/uL (ref 150–400)
RBC: 4.22 MIL/uL (ref 3.87–5.11)
RDW: 13.4 % (ref 11.5–15.5)
WBC: 9.2 K/uL (ref 4.0–10.5)
nRBC: 0 % (ref 0.0–0.2)

## 2024-06-26 LAB — COMPREHENSIVE METABOLIC PANEL WITH GFR
ALT: 11 U/L (ref 0–44)
AST: 19 U/L (ref 15–41)
Albumin: 4 g/dL (ref 3.5–5.0)
Alkaline Phosphatase: 71 U/L (ref 38–126)
Anion gap: 11 (ref 5–15)
BUN: 9 mg/dL (ref 8–23)
CO2: 25 mmol/L (ref 22–32)
Calcium: 9.3 mg/dL (ref 8.9–10.3)
Chloride: 103 mmol/L (ref 98–111)
Creatinine, Ser: 0.96 mg/dL (ref 0.44–1.00)
GFR, Estimated: >60 mL/min (ref >60)
Glucose, Bld: 112 mg/dL — ABNORMAL HIGH (ref 70–99)
Potassium: 3.2 mmol/L — ABNORMAL LOW (ref 3.5–5.1)
Sodium: 139 mmol/L (ref 135–145)
Total Bilirubin: 1.3 mg/dL — ABNORMAL HIGH (ref 0.0–1.2)
Total Protein: 7.4 g/dL (ref 6.5–8.1)

## 2024-06-26 LAB — D-DIMER, QUANTITATIVE: D-Dimer, Quant: 0.55 ug{FEU}/mL — ABNORMAL HIGH (ref 0.00–0.50)

## 2024-06-26 LAB — BRAIN NATRIURETIC PEPTIDE: B Natriuretic Peptide: 56 pg/mL (ref 0.0–100.0)

## 2024-06-26 LAB — TROPONIN I (HIGH SENSITIVITY)
Troponin I (High Sensitivity): 588 ng/L (ref <18)
Troponin I (High Sensitivity): 1116 ng/L (ref <18)

## 2024-06-26 MED ORDER — HEPARIN (PORCINE) 25000 UT/250ML-% IV SOLN
950.0000 [IU]/h | INTRAVENOUS | Status: DC
  Administered 2024-06-26: 750 [IU]/h via INTRAVENOUS
  Filled 2024-06-26: qty 250

## 2024-06-26 MED ORDER — HEPARIN BOLUS VIA INFUSION
3000.0000 [IU] | Freq: Once | INTRAVENOUS | Status: AC
  Administered 2024-06-26: 3000 [IU] via INTRAVENOUS

## 2024-06-26 MED ORDER — ALPRAZOLAM 0.5 MG PO TABS
0.5000 mg | ORAL_TABLET | Freq: Once | ORAL | Status: AC
  Administered 2024-06-26: 0.5 mg via ORAL
  Filled 2024-06-26: qty 1

## 2024-06-26 MED ORDER — ASPIRIN 325 MG PO TABS
325.0000 mg | ORAL_TABLET | Freq: Once | ORAL | Status: AC
  Administered 2024-06-26: 325 mg via ORAL
  Filled 2024-06-26: qty 1

## 2024-06-26 NOTE — Telephone Encounter (Signed)
 Pt had an appt put in wrong with Nena & I had to cancel appt. Pt ntbs w/Dettinger for med refill on her Xanax .

## 2024-06-26 NOTE — ED Notes (Signed)
 Pt given 8 0z cup of water and 16 oz gatorade

## 2024-06-26 NOTE — ED Notes (Signed)
 Patient transported to CT

## 2024-06-26 NOTE — ED Notes (Signed)
 Placed pt on O2 @2L  d/t sats of 84% on R/A. Sats improved to 93%.

## 2024-06-26 NOTE — ED Triage Notes (Signed)
 Pt bib EMS after neighbor stated pt fell face first in yard. Pt does not remember fall. C-Collar in place by EMS. Pt denies pain.

## 2024-06-26 NOTE — Progress Notes (Signed)
 PHARMACY - ANTICOAGULATION CONSULT NOTE  Pharmacy Consult for heparin  Indication: chest pain/ACS  Allergies  Allergen Reactions   Tetracyclines & Related Itching and Swelling    Patient Measurements: Height: 5' 5 (165.1 cm) Weight: 64 kg (141 lb 1.5 oz) IBW/kg (Calculated) : 57 HEPARIN  DW (KG): 64  Vital Signs: Temp: 98.4 F (36.9 C) (07/08 1912) Temp Source: Oral (07/08 1912) BP: 149/91 (07/08 2230) Pulse Rate: 88 (07/08 2230)  Labs: Recent Labs    06/26/24 2009 06/26/24 2211  HGB 14.0  --   HCT 41.2  --   PLT 254  --   CREATININE 0.96  --   TROPONINIHS 588* 1,116*    Estimated Creatinine Clearance: 47.7 mL/min (by C-G formula based on SCr of 0.96 mg/dL).   Medical History: Past Medical History:  Diagnosis Date   Anemia    Anxiety    Aortic atherosclerosis (HCC)    Carotid artery disease (HCC)    Depression    GERD (gastroesophageal reflux disease)    Hyperlipidemia    Hypertension    Orthostatic hypotension    Osteoporosis     Assessment: 72yo female c/o falling in yard (reported by neighbor, pt doesn't remember), imaging shows no acute issues but during w/u troponin found to be elevated and rising >> to begin heparin .  Goal of Therapy:  Heparin  level 0.3-0.7 units/ml Monitor platelets by anticoagulation protocol: Yes   Plan:  Heparin  3000 units IV bolus followed by infusion at 750 units/hr. Monitor heparin  levels and CBC.  Marvetta Dauphin, PharmD, BCPS  06/26/2024,11:15 PM

## 2024-06-26 NOTE — ED Notes (Signed)
 Patient transported to X-ray

## 2024-06-26 NOTE — ED Provider Notes (Signed)
 [place] [facility] AT [facility] Provider Note  CSN: [id number] Arrival date & time: 06/26/24 1844  Chief Complaint(s) Fall  HPI Jody Wilson is a 73 y.o. female with PMH orthostatic hypotension, anemia, CAD, HTN, HLD who presents emergency room for evaluation of a syncopal episode.  Patient states that she remembers getting out of her car but woke up on the ground with EMS.  States that she has had a similar episode before back in April where she was admitted for a femur fracture after her syncopal episode.  She does not wear oxygen at home but arrives 84% on room air.  Currently denies shortness of breath, chest pain, abdominal pain, nausea, vomiting, numbness contently, weakness or other systemic or traumatic complaints.   Past Medical History Past Medical History:  Diagnosis Date   Anemia    Anxiety    Aortic atherosclerosis (HCC)    Carotid artery disease (HCC)    Depression    GERD (gastroesophageal reflux disease)    Hyperlipidemia    Hypertension    Orthostatic hypotension    Osteoporosis    Patient Active Problem List   Diagnosis Date Noted   Syncope 06/27/2024   Elevated troponin 06/27/2024   Abnormal CT of liver 06/26/2024   Incontinence of feces 05/08/2024   Closed fracture of neck of left femur (HCC) 04/13/2024   Hypokalemia 04/13/2024   Closed hip fracture requiring operative repair with routine healing 02/09/2023   Pulmonary nodule 02/09/2023   Hypertriglyceridemia 01/22/2022   Abnormal findings on diagnostic imaging of liver and biliary tract 12/09/2021   Diarrhea 12/09/2021   History of colonic polyps 12/09/2021   Anxiety and depression 06/23/2020   Hiatal hernia 06/23/2020   Thoracic aortic atherosclerosis (HCC) 06/23/2020   Tobacco abuse 04/25/2020   Involuntary muscle jerks while sleeping 09/26/2019   B12 deficiency 09/25/2019   Iron deficiency anemia 09/25/2019   Elevated liver function tests 09/07/2019   Osteoporosis  02/09/2017   History of vertebral fracture 02/09/2017   GERD (gastroesophageal reflux disease) 03/11/2016   Hyperlipidemia with target LDL less than 100 02/19/2015   Hypertension 03/18/2014   Home Medication(s) Prior to Admission medications   Medication Sig Start Date End Date Taking? Authorizing Provider  ALPRAZolam  (XANAX ) 0.5 MG tablet Take 1 tablet (0.5 mg total) by mouth 2 (two) times daily as needed for anxiety. 04/16/24   Vicci Afton CROME, MD  aspirin  EC 81 MG tablet TAKE 1 PO BID THRU 05/13/24 THEN RESUME 1 PO DAILY 04/16/24   Johnson, Clanford L, MD  buPROPion  (WELLBUTRIN  XL) 150 MG 24 hr tablet Take 1 tablet (150 mg total) by mouth daily. 06/04/24   Dettinger, Fonda LABOR, MD  calcium -vitamin D  (OSCAL WITH D) 500-200 MG-UNIT tablet Take 1 tablet by mouth daily with lunch.     [provider]  citalopram  (CELEXA ) 40 MG tablet Take 1 tablet (40 mg total) by mouth daily. 06/04/24   Dettinger, Fonda LABOR, MD  cyanocobalamin  (VITAMIN B12) 1000 MCG/ML injection INJECT 1 ML IM EVERY 28 DAYS 10/31/23   Rogers Hai, MD  docusate sodium  (COLACE) 100 MG capsule Take 1 capsule (100 mg total) by mouth 2 (two) times daily. 04/16/24   Johnson, Clanford L, MD  ergocalciferol  (VITAMIN D2) 1.25 MG (50000 UT) capsule Take 1 capsule (50,000 Units total) by mouth every 14 (fourteen) days. 07/07/23   Lamon Pleasant HERO, PA-C  ezetimibe  (ZETIA ) 10 MG tablet Take 1 tablet (10 mg total) by mouth daily. 01/04/24  Dettinger, Fonda LABOR, MD  feeding supplement (ENSURE ENLIVE / ENSURE PLUS) LIQD Take 237 mLs by mouth 2 (two) times daily between meals. 04/17/24   Vicci Afton CROME, MD  ferrous sulfate  325 (65 FE) MG tablet Take 1 tablet (325 mg total) by mouth daily with breakfast. 05/08/24   Deitra Morton Hummer, Nena, NP  folic acid  (FOLVITE ) 1 MG tablet Take 1 mg by mouth every other day.    [provider]  HYDROcodone -acetaminophen  (NORCO/VICODIN) 5-325 MG tablet Take 1 tablet by mouth every  6 (six) hours as needed. 05/01/24   [provider]  methocarbamol  (ROBAXIN ) 500 MG tablet Take 1 tablet (500 mg total) by mouth 3 (three) times daily. 05/17/24   Margrette Taft BRAVO, MD  Multiple Vitamin (MULTIVITAMIN WITH MINERALS) TABS tablet Take 1 tablet by mouth daily. 04/17/24   Johnson, Clanford L, MD  olmesartan  (BENICAR ) 20 MG tablet Take 0.5 tablets (10 mg total) by mouth daily. 05/08/24   St Morton Hummer Nena, NP  ondansetron  (ZOFRAN ) 4 MG tablet Take 1 tablet (4 mg total) by mouth every 8 (eight) hours as needed for nausea or vomiting. 05/08/24   Francesca Elsie CROME, MD  pantoprazole  (PROTONIX ) 40 MG tablet Take 1 tablet (40 mg total) by mouth 2 (two) times daily before a meal. 06/04/24   Dettinger, Fonda LABOR, MD  polyethylene glycol (MIRALAX  / GLYCOLAX ) 17 g packet Take 17 g by mouth daily as needed for mild constipation. 04/16/24   Johnson, Clanford L, MD  pravastatin  (PRAVACHOL ) 40 MG tablet Take 1 tablet (40 mg total) by mouth every evening. 06/04/24   Dettinger, Fonda LABOR, MD                                                                                                                                    Past Surgical History Past Surgical History:  Procedure Laterality Date   ABDOMINAL HYSTERECTOMY     BACK SURGERY     CHOLECYSTECTOMY     FOOT SURGERY     7 in the past   HIP ARTHROPLASTY Right 02/11/2023   Procedure: ARTHROPLASTY BIPOLAR HIP (HEMIARTHROPLASTY);  Surgeon: Onesimo Oneil LABOR, MD;  Location: [facility];  Service: Orthopedics;  Laterality: Right;   HIP ARTHROPLASTY Left 04/13/2024   Procedure: HEMIARTHROPLASTY (BIPOLAR) HIP, DIRECT LATERAL  FOR FRACTURE;  Surgeon: Margrette Taft BRAVO, MD;  Location: [facility];  Service: Orthopedics;  Laterality: Left;   IR GENERIC HISTORICAL  12/09/2016   IR RADIOLOGIST EVAL & MGMT 12/09/2016 [facility]   IR GENERIC HISTORICAL  12/10/2016   IR VERTEBROPLASTY CERV/THOR BX INC UNI/BIL INC/INJECT/IMAGING 12/10/2016 [facility]   LEFT  HEART CATH AND CORONARY ANGIOGRAPHY N/A 09/30/2020   Procedure: LEFT HEART CATH AND CORONARY ANGIOGRAPHY;  Surgeon: Elmira Newman PARAS, MD;  Location: [facility];  Service: Cardiovascular;  Laterality: N/A;   TRACHEOSTOMY TUBE PLACEMENT  1982   Family History Family History  Problem Relation Age of Onset   Heart disease Mother    Hypertension Mother    Hyperlipidemia Mother    Diabetes Mother    Congestive Heart Failure Mother    Aneurysm Paternal Uncle    Heart attack Maternal Grandmother    Heart attack Maternal Grandfather    Hypertension Son    Other Son 40       Kienbock's Disease   Liver disease Son 52       Gilbert's Disease   Lupus Half-Sister    Atrial fibrillation Half-Sister    Breast cancer Niece     Social History Social History   Tobacco Use   Smoking status: Former    Current packs/day: 0.00    Average packs/day: 0.3 packs/day for 25.0 years (6.3 ttl pk-yrs)    Types: Cigarettes    Start date: 02/1998    Quit date: 02/2023    Years since quitting: 1.3    Passive exposure: Never   Smokeless tobacco: Never   Tobacco comments:    Trying to cut on number of cigarettes she smokes.  Vaping Use   Vaping status: Never Used  Substance Use Topics   Alcohol use: Not Currently    Comment: occassionally    Drug use: No   Allergies Tetracyclines & related  Review of Systems Review of Systems  Neurological:  Positive for syncope.    Physical Exam Vital Signs  I have reviewed the triage vital signs BP 102/64   Pulse 89   Temp 98 F (36.7 C)   Resp 19   Ht 5' 5 (1.651 m)   Wt 64 kg   SpO2 96%   BMI 23.48 kg/m   Physical Exam Vitals and nursing note reviewed.  Constitutional:      General: She is not in acute distress.    Appearance: She is well-developed.  HENT:     Head: Normocephalic and atraumatic.  Eyes:     Conjunctiva/sclera: Conjunctivae normal.  Cardiovascular:     Rate and Rhythm: Normal rate and regular rhythm.      Heart sounds: No murmur heard. Pulmonary:     Effort: Pulmonary effort is normal. No respiratory distress.     Breath sounds: Normal breath sounds.  Abdominal:     Palpations: Abdomen is soft.     Tenderness: There is no abdominal tenderness.  Musculoskeletal:        General: No swelling.     Cervical back: Neck supple.  Skin:    General: Skin is warm and dry.     Capillary Refill: Capillary refill takes less than 2 seconds.  Neurological:     Mental Status: She is alert.  Psychiatric:        Mood and Affect: Mood normal.     ED Results and Treatments Labs (all labs ordered are listed, but only abnormal results are displayed) Labs Reviewed  COMPREHENSIVE METABOLIC PANEL WITH GFR - Abnormal; Notable for the following components:      Result Value   Potassium 3.2 (*)    Glucose, Bld 112 (*)    Total Bilirubin 1.3 (*)    All other components within normal limits  D-DIMER, QUANTITATIVE - Abnormal; Notable for the following components:   D-Dimer, Quant 0.55 (*)    All other components within normal limits  TROPONIN I (HIGH SENSITIVITY) - Abnormal; Notable for the following components:   Troponin I (High Sensitivity) 588 (*)    All other components within normal limits  TROPONIN  I (HIGH SENSITIVITY) - Abnormal; Notable for the following components:   Troponin I (High Sensitivity) 1,116 (*)    All other components within normal limits  CBC WITH DIFFERENTIAL/PLATELET  BRAIN NATRIURETIC PEPTIDE  HEPARIN  LEVEL (UNFRACTIONATED)  BASIC METABOLIC PANEL WITH GFR  MAGNESIUM   CBC  TROPONIN I (HIGH SENSITIVITY)                                                                                                                          Radiology DG Chest Portable 1 View Result Date: 06/26/2024 CLINICAL DATA:  Fall hypoxia EXAM: PORTABLE CHEST 1 VIEW COMPARISON:  04/13/2024 FINDINGS: Hardware in the cervical spine. Moderate to large hiatal hernia. No acute airspace disease, pleural effusion  or pneumothorax. Stable cardiomediastinal silhouette with aortic atherosclerosis IMPRESSION: No active disease. Moderate to large hiatal hernia. Electronically Signed   By: Luke Bun M.D.   On: 06/26/2024 19:56   CT HEAD WO CONTRAST ( ) Result Date: 06/26/2024 CLINICAL DATA:  Head trauma, minor (Age >= 65y); Neck trauma (Age >= 65y) Fall EXAM: CT HEAD WITHOUT CONTRAST CT CERVICAL SPINE WITHOUT CONTRAST TECHNIQUE: Multidetector CT imaging of the head and cervical spine was performed following the standard protocol without intravenous contrast. Multiplanar CT image reconstructions of the cervical spine were also generated. RADIATION DOSE REDUCTION: This exam was performed according to the departmental dose-optimization program which includes automated exposure control, adjustment of the mA and/or kV according to patient size and/or use of iterative reconstruction technique. COMPARISON:  CT head and C-spine 04/13/2024 FINDINGS: CT HEAD FINDINGS Brain: No evidence of large-territorial acute infarction. No parenchymal hemorrhage. No mass lesion. No extra-axial collection. No mass effect or midline shift. No hydrocephalus. Basilar cisterns are patent. Vascular: No hyperdense vessel. Atherosclerotic calcifications are present within the cavernous internal carotid and vertebral arteries. Skull: No acute fracture or focal lesion. Sinuses/Orbits: Left sphenoid sinus mucosal thickening. Left sphenoid wall hypertrophy. Otherwise paranasal sinuses and mastoid air cells are clear. The orbits are unremarkable. Other: None. CT CERVICAL SPINE FINDINGS Alignment: Normal. Skull base and vertebrae: C5-C7 anterior cervical discectomy and fusion. No acute fracture. No aggressive appearing focal osseous lesion or focal pathologic process. Soft tissues and spinal canal: No prevertebral fluid or swelling. No visible canal hematoma. Upper chest: Biapical pleural/pulmonary scarring. Other: Atherosclerotic plaque of the carotid  arteries within the neck. IMPRESSION: 1. No acute intracranial abnormality. 2. No acute displaced fracture or traumatic listhesis of the cervical spine. Electronically Signed   By: Morgane  Naveau M.D.   On: 06/26/2024 19:53   CT CERVICAL SPINE WO CONTRAST Result Date: 06/26/2024 CLINICAL DATA:  Head trauma, minor (Age >= 65y); Neck trauma (Age >= 65y) Fall EXAM: CT HEAD WITHOUT CONTRAST CT CERVICAL SPINE WITHOUT CONTRAST TECHNIQUE: Multidetector CT imaging of the head and cervical spine was performed following the standard protocol without intravenous contrast. Multiplanar CT image reconstructions of the cervical spine were also generated. RADIATION DOSE REDUCTION: This exam was performed according to the departmental dose-optimization  program which includes automated exposure control, adjustment of the mA and/or kV according to patient size and/or use of iterative reconstruction technique. COMPARISON:  CT head and C-spine 04/13/2024 FINDINGS: CT HEAD FINDINGS Brain: No evidence of large-territorial acute infarction. No parenchymal hemorrhage. No mass lesion. No extra-axial collection. No mass effect or midline shift. No hydrocephalus. Basilar cisterns are patent. Vascular: No hyperdense vessel. Atherosclerotic calcifications are present within the cavernous internal carotid and vertebral arteries. Skull: No acute fracture or focal lesion. Sinuses/Orbits: Left sphenoid sinus mucosal thickening. Left sphenoid wall hypertrophy. Otherwise paranasal sinuses and mastoid air cells are clear. The orbits are unremarkable. Other: None. CT CERVICAL SPINE FINDINGS Alignment: Normal. Skull base and vertebrae: C5-C7 anterior cervical discectomy and fusion. No acute fracture. No aggressive appearing focal osseous lesion or focal pathologic process. Soft tissues and spinal canal: No prevertebral fluid or swelling. No visible canal hematoma. Upper chest: Biapical pleural/pulmonary scarring. Other: Atherosclerotic plaque of the  carotid arteries within the neck. IMPRESSION: 1. No acute intracranial abnormality. 2. No acute displaced fracture or traumatic listhesis of the cervical spine. Electronically Signed   By: Morgane  Naveau M.D.   On: 06/26/2024 19:53    Pertinent labs & imaging results that were available during my care of the patient were reviewed by me and considered in my medical decision making (see MDM for details).  Medications Ordered in ED Medications  heparin  ADULT infusion 100 units/mL (25000 units/250mL) (750 Units/hr Intravenous New Bag/Given 06/26/24 2352)  aspirin  EC tablet 81 mg (has no administration in time range)  irbesartan  (AVAPRO ) tablet 75 mg (has no administration in time range)  pravastatin  (PRAVACHOL ) tablet 40 mg (has no administration in time range)  ALPRAZolam  (XANAX ) tablet 0.5 mg (has no administration in time range)  buPROPion  (WELLBUTRIN  XL) 24 hr tablet 150 mg (has no administration in time range)  citalopram  (CELEXA ) tablet 40 mg (has no administration in time range)  pantoprazole  (PROTONIX ) EC tablet 40 mg (has no administration in time range)  sodium chloride  flush (NS) 0.9 % injection 3 mL (has no administration in time range)  potassium chloride  (KLOR-CON ) packet 40 mEq (has no administration in time range)  acetaminophen  (TYLENOL ) tablet 650 mg (has no administration in time range)    Or  acetaminophen  (TYLENOL ) suppository 650 mg (has no administration in time range)  oxyCODONE  (Oxy IR/ROXICODONE ) immediate release tablet 5 mg (has no administration in time range)  polyethylene glycol (MIRALAX  / GLYCOLAX ) packet 17 g (has no administration in time range)  ondansetron  (ZOFRAN ) tablet 4 mg (has no administration in time range)    Or  ondansetron  (ZOFRAN ) injection 4 mg (has no administration in time range)  lactated ringers  infusion (has no administration in time range)  ALPRAZolam  (XANAX ) tablet 0.5 mg (0.5 mg Oral Given 06/26/24 1933)  aspirin  tablet 325 mg (325 mg Oral  Given 06/26/24 2134)  heparin  bolus via infusion 3,000 Units (3,000 Units Intravenous Bolus from Bag 06/26/24 2353)  Procedures .Critical Care  Performed by: Albertina Dixon, MD Authorized by: Albertina Dixon, MD   Critical care provider statement:    Critical care time (minutes):  30   Critical care was necessary to treat or prevent imminent or life-threatening deterioration of the following conditions:  Cardiac failure   Critical care was time spent personally by me on the following activities:  Development of treatment plan with patient or surrogate, discussions with consultants, evaluation of patient's response to treatment, examination of patient, ordering and review of laboratory studies, ordering and review of radiographic studies, ordering and performing treatments and interventions, pulse oximetry, re-evaluation of patient's condition and review of old charts   (including critical care time)  Medical Decision Making / ED Course   This patient presents to the ED for concern of syncope, this involves an extensive number of treatment options, and is a complaint that carries with it a high risk of complications and morbidity.  The differential diagnosis includes orthostatic syncope, cardiogenic syncope, vasovagal syncope, electrolyte abnormality, dehydration, dysrhythmia, vasovagal, Hypoglycemia, Seizure, Autonomic Insufficiency  MDM: Patient seen in the Emergency Department for evaluation of syncope.  Physical exam with no appreciable tenderness of the C-spine and thus patient c-collar was clinically cleared.  No evidence of trauma over the chest abdomen or pelvis.  Cardiopulmonary exam is unremarkable.  Laboratory evaluation largely unremarkable outside of a potassium of 3.2 D-dimer 0.55 but negative by age-adjusted D-dimer, but the high-sensitivity troponin is  concerning.  Initial high-sensitivity troponin is 588.  ECG without evidence of ischemia.  Aspirin  given.  Spoke with the cardiologist on-call Dr. Otelia who is recommending delta troponin and if uptrending admission at [facility].  Delta troponin is uptrending to 1116 and heparin  started.  Trauma imaging including CT head and C-spine is negative.  Chest x-ray unremarkable.  Patient requiring 2 L nasal cannula to maintain oxygen saturation and source of hypoxia is unclear.  Patient will be admitted to the hospitalist service with a request that the patient be admitted at [facility] for cardiology consultation.  Do suspect that this is likely demand ischemia from hypoxia.  Patient admitted   Additional history obtained: -Additional history obtained from multiple family members -External records from outside source obtained and reviewed including: Chart review including previous notes, labs, imaging, consultation notes   Lab Tests: -I ordered, reviewed, and interpreted labs.   The pertinent results include:   Labs Reviewed  COMPREHENSIVE METABOLIC PANEL WITH GFR - Abnormal; Notable for the following components:      Result Value   Potassium 3.2 (*)    Glucose, Bld 112 (*)    Total Bilirubin 1.3 (*)    All other components within normal limits  D-DIMER, QUANTITATIVE - Abnormal; Notable for the following components:   D-Dimer, Quant 0.55 (*)    All other components within normal limits  TROPONIN I (HIGH SENSITIVITY) - Abnormal; Notable for the following components:   Troponin I (High Sensitivity) 588 (*)    All other components within normal limits  TROPONIN I (HIGH SENSITIVITY) - Abnormal; Notable for the following components:   Troponin I (High Sensitivity) 1,116 (*)    All other components within normal limits  CBC WITH DIFFERENTIAL/PLATELET  BRAIN NATRIURETIC PEPTIDE  HEPARIN  LEVEL (UNFRACTIONATED)  BASIC METABOLIC PANEL WITH GFR  MAGNESIUM   CBC  TROPONIN I (HIGH SENSITIVITY)       EKG   EKG Interpretation Date/Time:  Tuesday June 26 2024 21:07:40 EDT Ventricular Rate:  78 PR Interval:  140  QRS Duration:  82 QT Interval:  411 QTC Calculation: 469 R Axis:   67  Text Interpretation: Sinus rhythm Probable left atrial enlargement Minimal ST depression, inferior leads Confirmed by Meagan Spease ([id number]) on 06/27/2024 1:34:21 AM         Imaging Studies ordered: I ordered imaging studies including CT head, C-spine, chest x-ray I independently visualized and interpreted imaging. I agree with the radiologist interpretation   Medicines ordered and prescription drug management: Meds ordered this encounter  Medications   ALPRAZolam  (XANAX ) tablet 0.5 mg   aspirin  tablet 325 mg   heparin  bolus via infusion 3,000 Units   heparin  ADULT infusion 100 units/mL (25000 units/250mL)   aspirin  EC tablet 81 mg    TAKE 1 PO BID THRU 05/13/24 THEN RESUME 1 PO DAILY     irbesartan  (AVAPRO ) tablet 75 mg   pravastatin  (PRAVACHOL ) tablet 40 mg   ALPRAZolam  (XANAX ) tablet 0.5 mg   buPROPion  (WELLBUTRIN  XL) 24 hr tablet 150 mg   citalopram  (CELEXA ) tablet 40 mg   pantoprazole  (PROTONIX ) EC tablet 40 mg   sodium chloride  flush (NS) 0.9 % injection 3 mL   potassium chloride  (KLOR-CON ) packet 40 mEq   OR Linked Order Group    acetaminophen  (TYLENOL ) tablet 650 mg    acetaminophen  (TYLENOL ) suppository 650 mg   oxyCODONE  (Oxy IR/ROXICODONE ) immediate release tablet 5 mg    Refill:  0   polyethylene glycol (MIRALAX  / GLYCOLAX ) packet 17 g   OR Linked Order Group    ondansetron  (ZOFRAN ) tablet 4 mg    ondansetron  (ZOFRAN ) injection 4 mg   lactated ringers  infusion    -I have reviewed the patients home medicines and have made adjustments as needed  Critical interventions Aspirin , heparin , cardiology consultation  Consultations Obtained: I requested consultation with the cardiologist on-call,  and discussed lab and imaging findings as well as pertinent plan - they  recommend: [facility] admission   Cardiac Monitoring: The patient was maintained on a cardiac monitor.  I personally viewed and interpreted the cardiac monitored which showed an underlying rhythm of: NSR  Social Determinants of Health:  Factors impacting patients care include: none   Reevaluation: After the interventions noted above, I reevaluated the patient and found that they have :stayed the same  Co morbidities that complicate the patient evaluation  Past Medical History:  Diagnosis Date   Anemia    Anxiety    Aortic atherosclerosis (HCC)    Carotid artery disease (HCC)    Depression    GERD (gastroesophageal reflux disease)    Hyperlipidemia    Hypertension    Orthostatic hypotension    Osteoporosis       Dispostion: I considered admission for this patient, additional possible admission for hypoxia, NSTEMI and syncope     Final Clinical Impression(s) / ED Diagnoses Final diagnoses:  Syncope, unspecified syncope type  Hypoxia  NSTEMI (non-ST elevated myocardial infarction) (HCC)     @PCDICTATION @    Albertina Dixon, MD 06/27/24 [phone]

## 2024-06-27 ENCOUNTER — Encounter (HOSPITAL_COMMUNITY)
Admission: EM | Disposition: A | Discharge status: Home / Self Care | Payer: Self-pay | Source: Home / Self Care | Attending: Student

## 2024-06-27 ENCOUNTER — Encounter (HOSPITAL_COMMUNITY): Payer: Self-pay | Admitting: Family Medicine

## 2024-06-27 ENCOUNTER — Other Ambulatory Visit (HOSPITAL_COMMUNITY): Payer: Self-pay | Admitting: *Deleted

## 2024-06-27 ENCOUNTER — Observation Stay (HOSPITAL_COMMUNITY)

## 2024-06-27 DIAGNOSIS — E876 Hypokalemia: Secondary | ICD-10-CM | POA: Diagnosis not present

## 2024-06-27 DIAGNOSIS — R55 Syncope and collapse: Secondary | ICD-10-CM | POA: Diagnosis not present

## 2024-06-27 DIAGNOSIS — I429 Cardiomyopathy, unspecified: Secondary | ICD-10-CM | POA: Diagnosis not present

## 2024-06-27 DIAGNOSIS — I2510 Atherosclerotic heart disease of native coronary artery without angina pectoris: Secondary | ICD-10-CM | POA: Diagnosis not present

## 2024-06-27 DIAGNOSIS — J9601 Acute respiratory failure with hypoxia: Secondary | ICD-10-CM

## 2024-06-27 DIAGNOSIS — R7989 Other specified abnormal findings of blood chemistry: Secondary | ICD-10-CM | POA: Diagnosis present

## 2024-06-27 DIAGNOSIS — R0902 Hypoxemia: Secondary | ICD-10-CM | POA: Diagnosis present

## 2024-06-27 DIAGNOSIS — Z7401 Bed confinement status: Secondary | ICD-10-CM | POA: Diagnosis not present

## 2024-06-27 DIAGNOSIS — I214 Non-ST elevation (NSTEMI) myocardial infarction: Secondary | ICD-10-CM | POA: Diagnosis not present

## 2024-06-27 HISTORY — PX: LEFT HEART CATH AND CORONARY ANGIOGRAPHY: CATH118249

## 2024-06-27 LAB — CBC
HCT: 39.8 % (ref 36.0–46.0)
Hemoglobin: 12.6 g/dL (ref 12.0–15.0)
MCH: 31.9 pg (ref 26.0–34.0)
MCHC: 31.7 g/dL (ref 30.0–36.0)
MCV: 100.8 fL — ABNORMAL HIGH (ref 80.0–100.0)
Platelets: 194 K/uL (ref 150–400)
RBC: 3.95 MIL/uL (ref 3.87–5.11)
RDW: 13.4 % (ref 11.5–15.5)
WBC: 6.1 K/uL (ref 4.0–10.5)
nRBC: 0 % (ref 0.0–0.2)

## 2024-06-27 LAB — ECHOCARDIOGRAM COMPLETE
Area-P 1/2: 5.13 cm2
Height: 65 in
S' Lateral: 2.9 cm
Weight: 2257.51 [oz_av]

## 2024-06-27 LAB — HEPARIN LEVEL (UNFRACTIONATED): Heparin Unfractionated: 0.19 [IU]/mL — ABNORMAL LOW (ref 0.30–0.70)

## 2024-06-27 LAB — BASIC METABOLIC PANEL WITH GFR
Anion gap: 10 (ref 5–15)
BUN: 8 mg/dL (ref 8–23)
CO2: 23 mmol/L (ref 22–32)
Calcium: 8.5 mg/dL — ABNORMAL LOW (ref 8.9–10.3)
Chloride: 105 mmol/L (ref 98–111)
Creatinine, Ser: 0.7 mg/dL (ref 0.44–1.00)
GFR, Estimated: >60 mL/min (ref >60)
Glucose, Bld: 103 mg/dL — ABNORMAL HIGH (ref 70–99)
Potassium: 3.1 mmol/L — ABNORMAL LOW (ref 3.5–5.1)
Sodium: 138 mmol/L (ref 135–145)

## 2024-06-27 LAB — MAGNESIUM: Magnesium: 1.8 mg/dL (ref 1.7–2.4)

## 2024-06-27 LAB — TROPONIN I (HIGH SENSITIVITY)
Troponin I (High Sensitivity): 1221 ng/L (ref <18)
Troponin I (High Sensitivity): 865 ng/L (ref <18)

## 2024-06-27 SURGERY — LEFT HEART CATH AND CORONARY ANGIOGRAPHY
Anesthesia: LOCAL

## 2024-06-27 MED ORDER — SODIUM CHLORIDE 0.9 % IV SOLN: INTRAVENOUS | Status: AC

## 2024-06-27 MED ORDER — ASPIRIN 81 MG PO TBEC
81.0000 mg | DELAYED_RELEASE_TABLET | Freq: Every day | ORAL | Status: DC
  Administered 2024-06-27 – 2024-06-29 (×3): 81 mg via ORAL
  Filled 2024-06-27 (×3): qty 1

## 2024-06-27 MED ORDER — LACTATED RINGERS IV SOLN: INTRAVENOUS | Status: DC

## 2024-06-27 MED ORDER — POTASSIUM CHLORIDE 20 MEQ PO PACK
40.0000 meq | PACK | Freq: Once | ORAL | Status: AC
  Administered 2024-06-27: 40 meq via ORAL
  Filled 2024-06-27: qty 2

## 2024-06-27 MED ORDER — FENTANYL CITRATE (PF) 100 MCG/2ML IJ SOLN
INTRAMUSCULAR | Status: AC
  Filled 2024-06-27: qty 2

## 2024-06-27 MED ORDER — FENTANYL CITRATE (PF) 100 MCG/2ML IJ SOLN
INTRAMUSCULAR | Status: DC | PRN
  Administered 2024-06-27: 25 ug via INTRAVENOUS

## 2024-06-27 MED ORDER — ACETAMINOPHEN 325 MG PO TABS: 650.0000 mg | ORAL_TABLET | Freq: Four times a day (QID) | ORAL | Status: DC | PRN

## 2024-06-27 MED ORDER — LIDOCAINE HCL (PF) 1 % IJ SOLN
INTRAMUSCULAR | Status: DC | PRN
  Administered 2024-06-27: 2 mL

## 2024-06-27 MED ORDER — CITALOPRAM HYDROBROMIDE 20 MG PO TABS
40.0000 mg | ORAL_TABLET | Freq: Every day | ORAL | Status: DC
  Administered 2024-06-27 – 2024-06-29 (×3): 40 mg via ORAL
  Filled 2024-06-27 (×3): qty 2

## 2024-06-27 MED ORDER — SODIUM CHLORIDE 0.9 % IV SOLN: 250.0000 mL | INTRAVENOUS | Status: AC | PRN

## 2024-06-27 MED ORDER — LIDOCAINE HCL (PF) 1 % IJ SOLN
INTRAMUSCULAR | Status: AC
  Filled 2024-06-27: qty 30

## 2024-06-27 MED ORDER — LABETALOL HCL 5 MG/ML IV SOLN: 10.0000 mg | INTRAVENOUS | Status: AC | PRN

## 2024-06-27 MED ORDER — ONDANSETRON HCL 4 MG PO TABS: 4.0000 mg | ORAL_TABLET | Freq: Four times a day (QID) | ORAL | Status: DC | PRN

## 2024-06-27 MED ORDER — IRBESARTAN 75 MG PO TABS: 75.0000 mg | ORAL_TABLET | Freq: Every day | ORAL | Status: DC

## 2024-06-27 MED ORDER — HYDRALAZINE HCL 20 MG/ML IJ SOLN: 10.0000 mg | INTRAMUSCULAR | Status: AC | PRN

## 2024-06-27 MED ORDER — OXYCODONE HCL 5 MG PO TABS
5.0000 mg | ORAL_TABLET | ORAL | Status: DC | PRN
  Administered 2024-06-27: 5 mg via ORAL
  Filled 2024-06-27: qty 1

## 2024-06-27 MED ORDER — BUPROPION HCL ER (XL) 150 MG PO TB24
150.0000 mg | ORAL_TABLET | Freq: Every day | ORAL | Status: DC
  Administered 2024-06-27 – 2024-06-29 (×3): 150 mg via ORAL
  Filled 2024-06-27 (×3): qty 1

## 2024-06-27 MED ORDER — HEPARIN (PORCINE) IN NACL 1000-0.9 UT/500ML-% IV SOLN
INTRAVENOUS | Status: DC | PRN
  Administered 2024-06-27 (×2): 500 mL

## 2024-06-27 MED ORDER — PANTOPRAZOLE SODIUM 40 MG PO TBEC
40.0000 mg | DELAYED_RELEASE_TABLET | Freq: Two times a day (BID) | ORAL | Status: DC
  Administered 2024-06-27 – 2024-06-29 (×4): 40 mg via ORAL
  Filled 2024-06-27 (×4): qty 1

## 2024-06-27 MED ORDER — HEPARIN SODIUM (PORCINE) 1000 UNIT/ML IJ SOLN
INTRAMUSCULAR | Status: DC | PRN
  Administered 2024-06-27: 3000 [IU] via INTRAVENOUS

## 2024-06-27 MED ORDER — HEPARIN BOLUS VIA INFUSION
1500.0000 [IU] | Freq: Once | INTRAVENOUS | Status: AC
  Administered 2024-06-27: 1500 [IU] via INTRAVENOUS

## 2024-06-27 MED ORDER — IRBESARTAN 75 MG PO TABS
75.0000 mg | ORAL_TABLET | Freq: Every day | ORAL | Status: DC
  Administered 2024-06-27 – 2024-06-29 (×3): 75 mg via ORAL
  Filled 2024-06-27 (×3): qty 1

## 2024-06-27 MED ORDER — VERAPAMIL HCL 2.5 MG/ML IV SOLN
INTRAVENOUS | Status: AC
Start: 2024-06-27 — End: 2024-06-27
  Filled 2024-06-27: qty 2

## 2024-06-27 MED ORDER — ONDANSETRON HCL 4 MG/2ML IJ SOLN: 4.0000 mg | Freq: Four times a day (QID) | INTRAMUSCULAR | Status: DC | PRN

## 2024-06-27 MED ORDER — SODIUM CHLORIDE 0.9% FLUSH
3.0000 mL | Freq: Two times a day (BID) | INTRAVENOUS | Status: DC
  Administered 2024-06-27 – 2024-06-29 (×4): 3 mL via INTRAVENOUS

## 2024-06-27 MED ORDER — ACETAMINOPHEN 650 MG RE SUPP: 650.0000 mg | Freq: Four times a day (QID) | RECTAL | Status: DC | PRN

## 2024-06-27 MED ORDER — MIDAZOLAM HCL 2 MG/2ML IJ SOLN
INTRAMUSCULAR | Status: AC
  Filled 2024-06-27: qty 2

## 2024-06-27 MED ORDER — POLYETHYLENE GLYCOL 3350 17 G PO PACK: 17.0000 g | PACK | Freq: Every day | ORAL | Status: DC | PRN

## 2024-06-27 MED ORDER — SODIUM CHLORIDE 0.9% FLUSH
3.0000 mL | Freq: Two times a day (BID) | INTRAVENOUS | Status: DC
  Administered 2024-06-27 – 2024-06-29 (×3): 3 mL via INTRAVENOUS

## 2024-06-27 MED ORDER — SODIUM CHLORIDE 0.9% FLUSH
3.0000 mL | INTRAVENOUS | Status: DC | PRN
Start: 2024-06-27 — End: 2024-06-29

## 2024-06-27 MED ORDER — CARVEDILOL 3.125 MG PO TABS
3.1250 mg | ORAL_TABLET | Freq: Two times a day (BID) | ORAL | Status: DC
  Administered 2024-06-27 – 2024-06-29 (×4): 3.125 mg via ORAL
  Filled 2024-06-27 (×5): qty 1

## 2024-06-27 MED ORDER — HEPARIN SODIUM (PORCINE) 1000 UNIT/ML IJ SOLN
INTRAMUSCULAR | Status: AC
  Filled 2024-06-27: qty 10

## 2024-06-27 MED ORDER — ALPRAZOLAM 0.5 MG PO TABS
0.5000 mg | ORAL_TABLET | Freq: Two times a day (BID) | ORAL | Status: DC | PRN
  Administered 2024-06-27 – 2024-06-29 (×3): 0.5 mg via ORAL
  Filled 2024-06-27 (×3): qty 1

## 2024-06-27 MED ORDER — MIDAZOLAM HCL 2 MG/2ML IJ SOLN
INTRAMUSCULAR | Status: DC | PRN
  Administered 2024-06-27: 1 mg via INTRAVENOUS

## 2024-06-27 MED ORDER — VERAPAMIL HCL 2.5 MG/ML IV SOLN
INTRAVENOUS | Status: DC | PRN
  Administered 2024-06-27: 10 mL via INTRA_ARTERIAL

## 2024-06-27 MED ORDER — IOHEXOL 350 MG/ML SOLN
INTRAVENOUS | Status: DC | PRN
  Administered 2024-06-27: 30 mL

## 2024-06-27 MED ORDER — PRAVASTATIN SODIUM 40 MG PO TABS
40.0000 mg | ORAL_TABLET | Freq: Every evening | ORAL | Status: DC
  Administered 2024-06-27 – 2024-06-28 (×2): 40 mg via ORAL
  Filled 2024-06-27 (×2): qty 1

## 2024-06-27 SURGICAL SUPPLY — 7 items
CATH 5FR JL3.5 JR4 ANG PIG MP (CATHETERS) IMPLANT
DEVICE RAD COMP TR BAND LRG (VASCULAR PRODUCTS) IMPLANT
GLIDESHEATH SLEND SS 6F .021 (SHEATH) IMPLANT
GUIDEWIRE INQWIRE 1.5J.035X260 (WIRE) IMPLANT
PACK CARDIAC CATHETERIZATION (CUSTOM PROCEDURE TRAY) ×1 IMPLANT
SET ATX-X65L (MISCELLANEOUS) IMPLANT
SHEATH PROBE COVER 6X72 (BAG) IMPLANT

## 2024-06-27 NOTE — ED Notes (Signed)
 Pt ambulated to bathroom with standby assistance, pt tolerated well. Pt states mild dizziness upon standing.

## 2024-06-27 NOTE — Consult Note (Signed)
 Cardiology Consultation   Patient ID: Jody Wilson MRN: [id number]; DOB: 10/18/1951  Admit date: 06/26/2024 Date of Consult: 06/27/2024  PCP:  Dettinger, Fonda LABOR, MD   [facility] Providers Cardiologist:  Madonna Large, DO        Patient Profile: Jody Wilson is a 73 y.o. female with a hx of HTN, HLD, carotid artery disease and history of chest pain (false positive NST in 07/2020 with cath in 09/2020 showing normal coronary arteries) who is being seen 06/27/2024 for the evaluation of syncope and elevated troponin values at the request of Dr. Charlton.  History of Present Illness: Jody Wilson was examined by Dr. Large in 03/2024 and denied any recent anginal symptoms at that time. Was continued on her current cardiac medications of ASA 81 mg twice daily, Zetia  10 mg daily, HCTZ 12.5 mg daily, Olmesartan  20 mg daily and Pravastatin  40 mg daily.  The patient presented to [facility] yesterday evening after having a syncopal episode. She reported remembering getting out of her car but woke up on the ground with EMS present. Reported having a possible prior syncopal event in 03/2024 and at that time she had suffered a left hip femoral neck fracture and underwent ORIF.  By review of notes from that hospitalization, she was unsure if she had lost consciousness prior to the event.  Was hypoxic while in the ED and improved with application of nasal cannula. Initial labs this admission showed WBC 9.2, Hgb 14.0, platelets 254, Na+ 139, K+ 3.2 and creatinine 0.96. D-dimer mildly elevated at 0.55. Initial and repeat Hs Troponin values elevated at 588, 1116, 1221 and 865. CXR with no active disease. Noted to have a moderate to large hiatal hernia. CT Head with no acute intracranial abnormalities. EKG shows normal sinus rhythm, heart rate 78 with no acute ST changes.  The ED provider did review with the cardiology fellow overnight and the patient was started on IV Heparin . Was recommended to be  admitted to [facility] but still awaiting a bed.  In talking with the patient and her son today, most history is provided by her son as she keeps falling asleep as she did not sleep well overnight. Her husband is currently admitted to the hospital. She was here visiting him yesterday and reports not consuming any liquids or food throughout the day. Upon driving home, she started walking to the mailbox and lost consciousness. Reports feeling a tremor sensation along her upper arms and then lost consciousness. No associated chest pain or palpitations. A neighbor witnessed the episode and called EMS. Her son believes she lost consciousness for total of 5 to 10 minutes. Reports she had altered mental status afterwards and is unable to recall the name of family members. No bowel or bladder incontinence.   Past Medical History:  Diagnosis Date   Anemia    Anxiety    Aortic atherosclerosis (HCC)    Carotid artery disease (HCC)    Depression    GERD (gastroesophageal reflux disease)    Hyperlipidemia    Hypertension    Orthostatic hypotension    Osteoporosis     Past Surgical History:  Procedure Laterality Date   ABDOMINAL HYSTERECTOMY     BACK SURGERY     CHOLECYSTECTOMY     FOOT SURGERY     7 in the past   HIP ARTHROPLASTY Right 02/11/2023   Procedure: ARTHROPLASTY BIPOLAR HIP (HEMIARTHROPLASTY);  Surgeon: Onesimo Oneil LABOR, MD;  Location: [facility];  Service: Orthopedics;  Laterality: Right;   HIP ARTHROPLASTY Left 04/13/2024   Procedure: HEMIARTHROPLASTY (BIPOLAR) HIP, DIRECT LATERAL  FOR FRACTURE;  Surgeon: Margrette Taft BRAVO, MD;  Location: [facility];  Service: Orthopedics;  Laterality: Left;   IR GENERIC HISTORICAL  12/09/2016   IR RADIOLOGIST EVAL & MGMT 12/09/2016 [facility]   IR GENERIC HISTORICAL  12/10/2016   IR VERTEBROPLASTY CERV/THOR BX INC UNI/BIL INC/INJECT/IMAGING 12/10/2016 [facility]   LEFT HEART CATH AND CORONARY ANGIOGRAPHY N/A 09/30/2020   Procedure: LEFT HEART CATH AND  CORONARY ANGIOGRAPHY;  Surgeon: Elmira Newman PARAS, MD;  Location: [facility];  Service: Cardiovascular;  Laterality: N/A;   TRACHEOSTOMY TUBE PLACEMENT  1982     Home Medications:  Prior to Admission medications   Medication Sig Start Date End Date Taking? Authorizing Provider  ALPRAZolam  (XANAX ) 0.5 MG tablet Take 1 tablet (0.5 mg total) by mouth 2 (two) times daily as needed for anxiety. 04/16/24  Yes Johnson, Clanford L, MD  aspirin  EC 81 MG tablet TAKE 1 PO BID THRU 05/13/24 THEN RESUME 1 PO DAILY 04/16/24  Yes Johnson, Clanford L, MD  buPROPion  (WELLBUTRIN  XL) 150 MG 24 hr tablet Take 1 tablet (150 mg total) by mouth daily. 06/04/24  Yes Dettinger, Fonda LABOR, MD  citalopram  (CELEXA ) 40 MG tablet Take 1 tablet (40 mg total) by mouth daily. 06/04/24  Yes Dettinger, Fonda LABOR, MD  cyanocobalamin  (VITAMIN B12) 1000 MCG/ML injection INJECT 1 ML IM EVERY 28 DAYS 10/31/23  Yes Rogers Hai, MD  docusate sodium  (COLACE) 100 MG capsule Take 1 capsule (100 mg total) by mouth 2 (two) times daily. 04/16/24  Yes Johnson, Clanford L, MD  ergocalciferol  (VITAMIN D2) 1.25 MG (50000 UT) capsule Take 1 capsule (50,000 Units total) by mouth every 14 (fourteen) days. 07/07/23  Yes Pennington, Rebekah M, PA-C  ezetimibe  (ZETIA ) 10 MG tablet Take 10 mg by mouth daily.   Yes [provider]  feeding supplement (ENSURE ENLIVE / ENSURE PLUS) LIQD Take 237 mLs by mouth 2 (two) times daily between meals. 04/17/24  Yes Johnson, Clanford L, MD  folic acid  (FOLVITE ) 1 MG tablet Take 1 mg by mouth every other day.   Yes [provider]  methocarbamol  (ROBAXIN ) 500 MG tablet Take 1 tablet (500 mg total) by mouth 3 (three) times daily. 05/17/24  Yes Margrette Taft BRAVO, MD  Multiple Vitamin (MULTIVITAMIN WITH MINERALS) TABS tablet Take 1 tablet by mouth daily. 04/17/24  Yes Johnson, Clanford L, MD  olmesartan  (BENICAR ) 20 MG tablet Take 0.5 tablets (10 mg total) by mouth daily. 05/08/24  Yes [facility] Morton Hummer, Nena, NP  ondansetron  (ZOFRAN ) 4 MG tablet Take 1 tablet (4 mg total) by mouth every 8 (eight) hours as needed for nausea or vomiting. 05/08/24  Yes Francesca Elsie CROME, MD  pantoprazole  (PROTONIX ) 40 MG tablet Take 1 tablet (40 mg total) by mouth 2 (two) times daily before a meal. 06/04/24  Yes Dettinger, Fonda LABOR, MD  polyethylene glycol (MIRALAX  / GLYCOLAX ) 17 g packet Take 17 g by mouth daily as needed for mild constipation. 04/16/24  Yes Johnson, Clanford L, MD  pravastatin  (PRAVACHOL ) 40 MG tablet Take 1 tablet (40 mg total) by mouth every evening. 06/04/24  Yes Dettinger, Fonda LABOR, MD    Scheduled Meds:  aspirin  EC  81 mg Oral Daily   buPROPion   150 mg Oral Daily   citalopram   40 mg Oral Daily   irbesartan   75 mg Oral Daily   pantoprazole   40 mg  Oral BID AC   pravastatin   40 mg Oral QPM   sodium chloride  flush  3 mL Intravenous Q12H   Continuous Infusions:  sodium chloride  75 mL/hr at 06/27/24 0208   heparin  950 Units/hr (06/27/24 0851)   PRN Meds: acetaminophen  **OR** acetaminophen , ALPRAZolam , ondansetron  **OR** ondansetron  (ZOFRAN ) IV, oxyCODONE , polyethylene glycol  Allergies:    Allergies  Allergen Reactions   Tetracyclines & Related Itching and Swelling    Social History:   Social History   Socioeconomic History   Marital status: Married    Spouse name: Donnie   Number of children: 2   Years of education: 12   Highest education level: Associate degree: occupational, Scientist, product/process development, or vocational program  Occupational History   Occupation: retired    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  Tobacco Use   Smoking status: Former    Current packs/day: 0.00    Average packs/day: 0.3 packs/day for 25.0 years (6.3 ttl pk-yrs)    Types: Cigarettes    Start date: 02/1998    Quit date: 02/2023    Years since quitting: 1.3    Passive exposure: Never   Smokeless tobacco: Never   Tobacco comments:    Trying to cut on  number of cigarettes she smokes.  Vaping Use   Vaping status: Never Used  Substance and Sexual Activity   Alcohol use: Not Currently    Comment: occassionally    Drug use: No   Sexual activity: Not on file  Other Topics Concern   Not on file  Social History Narrative   Lives with her husband, Jenene, and has two grown sons and two grand sons.   She visits family and has a once weekly outing with a friend for socializing.  She enjoys making beaded jewelry, reading and walking.   Social Drivers of Corporate investment banker Strain: Low Risk  (12/27/2023)   Overall Financial Resource Strain (CARDIA)    Difficulty of Paying Living Expenses: Not hard at all  Food Insecurity: No Food Insecurity (04/13/2024)   Hunger Vital Sign    Worried About Running Out of Food in the Last Year: Never true    Ran Out of Food in the Last Year: Never true  Transportation Needs: No Transportation Needs (04/13/2024)   PRAPARE - Administrator, Civil Service (Medical): No    Lack of Transportation (Non-Medical): No  Physical Activity: Inactive (12/27/2023)   Exercise Vital Sign    Days of Exercise per Week: 0 days    Minutes of Exercise per Session: 0 min  Stress: No Stress Concern Present (12/27/2023)   [employer] of Occupational Health - Occupational Stress Questionnaire    Feeling of Stress : Not at all  Social Connections: Socially Integrated (04/13/2024)   Social Connection and Isolation Panel    Frequency of Communication with Friends and Family: More than three times a week    Frequency of Social Gatherings with Friends and Family: More than three times a week    Attends Religious Services: More than 4 times per year    Active Member of [employer] or Organizations: Yes    Attends Engineer, structural: More than 4 times per year    Marital Status: Married  Catering manager Violence: Not At Risk (04/13/2024)   Humiliation, Afraid, Rape, and Kick questionnaire    Fear of Current or  Ex-Partner: No    Emotionally Abused: No    Physically Abused:  No    Sexually Abused: No    Family History:    Family History  Problem Relation Age of Onset   Heart disease Mother    Hypertension Mother    Hyperlipidemia Mother    Diabetes Mother    Congestive Heart Failure Mother    Aneurysm Paternal Uncle    Heart attack Maternal Grandmother    Heart attack Maternal Grandfather    Hypertension Son    Other Son 55       Kienbock's Disease   Liver disease Son 53       Gilbert's Disease   Lupus Half-Sister    Atrial fibrillation Half-Sister    Breast cancer Niece      ROS:  Please see the history of present illness.   All other ROS reviewed and negative.     Physical Exam/Data: Vitals:   06/27/24 0600 06/27/24 0630 06/27/24 0731 06/27/24 0800  BP: 136/80 (!) 146/86  (!) 147/92  Pulse: 69 80  67  Resp: 19 20  19   Temp:   97.9 F (36.6 C)   TempSrc:   Oral   SpO2: 95% 93%  92%  Weight:      Height:        Intake/Output Summary (Last 24 hours) at 06/27/2024 0941 Last data filed at 06/27/2024 9171 Gross per 24 hour  Intake 94.16 ml  Output --  Net 94.16 ml      06/26/2024    7:13 PM 05/23/2024    9:04 AM 05/08/2024   12:44 PM  Last 3 Weights  Weight (lbs) 141 lb 1.5 oz 140 lb 135 lb  Weight (kg) 64 kg 63.504 kg 61.236 kg     Body mass index is 23.48 kg/m.  General:  Well nourished, well developed female appearing in no acute distress HEENT: normal Neck: no JVD Vascular: No carotid bruits; Distal pulses 2+ bilaterally Cardiac:  normal S1, S2; RRR; no murmur  Lungs:  clear to auscultation bilaterally, no wheezing, rhonchi or rales  Abd: soft, nontender, no hepatomegaly  Ext: no pitting edema Musculoskeletal:  No deformities, BUE and BLE strength normal and equal Skin: warm and dry  Neuro:  CNs 2-12 intact, no focal abnormalities noted Psych:  Normal affect   EKG:  The EKG was personally reviewed and demonstrates: Normal sinus rhythm, heart rate 78 with no  acute ST changes.  Telemetry:  Telemetry was personally reviewed and demonstrates: NSR, HR in 80's to 90's.   Relevant CV Studies:  Cardiac Catheterization: 09/2020 Right dominant circulation Normal coronary arteries Normal LVEDP  Carotid Dopplers: 03/2023 Duplex suggests stenosis in the right internal carotid artery (minimal).  Duplex suggests stenosis in the left internal carotid artery (minimal).  Antegrade right vertebral artery flow. Antegrade left vertebral artery  flow.   Echocardiogram: 02/2024 IMPRESSIONS     1. Left ventricular ejection fraction, by estimation, is 60 to 65%. Left  ventricular ejection fraction by 3D volume is 60 %. The left ventricle has  normal function. The left ventricle has no regional wall motion  abnormalities. Left ventricular diastolic   parameters are consistent with Grade I diastolic dysfunction (impaired  relaxation). The average left ventricular global longitudinal strain is  -26.6 %. The global longitudinal strain is normal.   2. Right ventricular systolic function is normal. The right ventricular  size is normal. There is normal pulmonary artery systolic pressure. The  estimated right ventricular systolic pressure is 31.7 mmHg.   3. The mitral valve is normal in  structure. Mild mitral valve  regurgitation. No evidence of mitral stenosis.   4. The aortic valve is normal in structure. Aortic valve regurgitation is  not visualized. No aortic stenosis is present.   5. The inferior vena cava is normal in size with greater than 50%  respiratory variability, suggesting right atrial pressure of 3 mmHg.   Comparison(s): No significant change from prior study. Prior images  reviewed side by side.    Laboratory Data: High Sensitivity Troponin:   Recent Labs  Lab 06/26/24 2009 06/26/24 2211 06/27/24 0148 06/27/24 0617  TROPONINIHS 588* 1,116* 1,221* 865*     Chemistry Recent Labs  Lab 06/26/24 2009 06/27/24 0148 06/27/24 0617  NA  139  --  138  K 3.2*  --  3.1*  CL 103  --  105  CO2 25  --  23  GLUCOSE 112*  --  103*  BUN 9  --  8  CREATININE 0.96  --  0.70  CALCIUM  9.3  --  8.5*  MG  --  1.8  --   GFRNONAA >60  --  >60  ANIONGAP 11  --  10    Recent Labs  Lab 06/26/24 2009  PROT 7.4  ALBUMIN 4.0  AST 19  ALT 11  ALKPHOS 71  BILITOT 1.3*   Lipids No results for input(s): CHOL, TRIG, HDL, LABVLDL, LDLCALC, CHOLHDL in the last 168 hours.  Hematology Recent Labs  Lab 06/26/24 2009 06/27/24 0617  WBC 9.2 6.1  RBC 4.22 3.95  HGB 14.0 12.6  HCT 41.2 39.8  MCV 97.6 100.8*  MCH 33.2 31.9  MCHC 34.0 31.7  RDW 13.4 13.4  PLT 254 194   Thyroid  No results for input(s): TSH, FREET4 in the last 168 hours.  BNP Recent Labs  Lab 06/26/24 2009  BNP 56.0    DDimer  Recent Labs  Lab 06/26/24 2009  DDIMER 0.55*    Radiology/Studies:  DG Chest Portable 1 View Result Date: 06/26/2024 CLINICAL DATA:  Fall hypoxia EXAM: PORTABLE CHEST 1 VIEW COMPARISON:  04/13/2024 FINDINGS: Hardware in the cervical spine. Moderate to large hiatal hernia. No acute airspace disease, pleural effusion or pneumothorax. Stable cardiomediastinal silhouette with aortic atherosclerosis IMPRESSION: No active disease. Moderate to large hiatal hernia. Electronically Signed   By: Luke Bun M.D.   On: 06/26/2024 19:56   CT HEAD WO CONTRAST ( ) Result Date: 06/26/2024 CLINICAL DATA:  Head trauma, minor (Age >= 65y); Neck trauma (Age >= 65y) Fall EXAM: CT HEAD WITHOUT CONTRAST CT CERVICAL SPINE WITHOUT CONTRAST TECHNIQUE: Multidetector CT imaging of the head and cervical spine was performed following the standard protocol without intravenous contrast. Multiplanar CT image reconstructions of the cervical spine were also generated. RADIATION DOSE REDUCTION: This exam was performed according to the departmental dose-optimization program which includes automated exposure control, adjustment of the mA and/or kV according to  patient size and/or use of iterative reconstruction technique. COMPARISON:  CT head and C-spine 04/13/2024 FINDINGS: CT HEAD FINDINGS Brain: No evidence of large-territorial acute infarction. No parenchymal hemorrhage. No mass lesion. No extra-axial collection. No mass effect or midline shift. No hydrocephalus. Basilar cisterns are patent. Vascular: No hyperdense vessel. Atherosclerotic calcifications are present within the cavernous internal carotid and vertebral arteries. Skull: No acute fracture or focal lesion. Sinuses/Orbits: Left sphenoid sinus mucosal thickening. Left sphenoid wall hypertrophy. Otherwise paranasal sinuses and mastoid air cells are clear. The orbits are unremarkable. Other: None. CT CERVICAL SPINE FINDINGS Alignment: Normal. Skull base and vertebrae: C5-C7 anterior  cervical discectomy and fusion. No acute fracture. No aggressive appearing focal osseous lesion or focal pathologic process. Soft tissues and spinal canal: No prevertebral fluid or swelling. No visible canal hematoma. Upper chest: Biapical pleural/pulmonary scarring. Other: Atherosclerotic plaque of the carotid arteries within the neck. IMPRESSION: 1. No acute intracranial abnormality. 2. No acute displaced fracture or traumatic listhesis of the cervical spine. Electronically Signed   By: Morgane  Naveau M.D.   On: 06/26/2024 19:53   CT CERVICAL SPINE WO CONTRAST Result Date: 06/26/2024 CLINICAL DATA:  Head trauma, minor (Age >= 65y); Neck trauma (Age >= 65y) Fall EXAM: CT HEAD WITHOUT CONTRAST CT CERVICAL SPINE WITHOUT CONTRAST TECHNIQUE: Multidetector CT imaging of the head and cervical spine was performed following the standard protocol without intravenous contrast. Multiplanar CT image reconstructions of the cervical spine were also generated. RADIATION DOSE REDUCTION: This exam was performed according to the departmental dose-optimization program which includes automated exposure control, adjustment of the mA and/or kV  according to patient size and/or use of iterative reconstruction technique. COMPARISON:  CT head and C-spine 04/13/2024 FINDINGS: CT HEAD FINDINGS Brain: No evidence of large-territorial acute infarction. No parenchymal hemorrhage. No mass lesion. No extra-axial collection. No mass effect or midline shift. No hydrocephalus. Basilar cisterns are patent. Vascular: No hyperdense vessel. Atherosclerotic calcifications are present within the cavernous internal carotid and vertebral arteries. Skull: No acute fracture or focal lesion. Sinuses/Orbits: Left sphenoid sinus mucosal thickening. Left sphenoid wall hypertrophy. Otherwise paranasal sinuses and mastoid air cells are clear. The orbits are unremarkable. Other: None. CT CERVICAL SPINE FINDINGS Alignment: Normal. Skull base and vertebrae: C5-C7 anterior cervical discectomy and fusion. No acute fracture. No aggressive appearing focal osseous lesion or focal pathologic process. Soft tissues and spinal canal: No prevertebral fluid or swelling. No visible canal hematoma. Upper chest: Biapical pleural/pulmonary scarring. Other: Atherosclerotic plaque of the carotid arteries within the neck. IMPRESSION: 1. No acute intracranial abnormality. 2. No acute displaced fracture or traumatic listhesis of the cervical spine. Electronically Signed   By: Morgane  Naveau M.D.   On: 06/26/2024 19:53     Assessment and Plan:  1. NSTEMI - Presented after having a syncopal episode as described above but denies any associated chest pain or shortness of breath. Hs troponin values found to be elevated up to 1221 and downtrending to 865 on most recent check. - Given her significant enzyme elevation and presentation of syncope, may need to consider a relook cardiac catheterization. Would be hesitant to pursue a repeat NST given her history of a false positive NST in 07/2020.  Echocardiogram was performed this morning with the official report pending. If found to have a new cardiomyopathy  or wall motion abnormalities, would recommend a cardiac catheterization for definitive evaluation. - Continue IV Heparin  for now along with ASA 81 mg daily and Pravastatin  40 mg daily. Will recheck an FLP.  2. Carotid Artery Disease - Dopplers in 03/2023 showed minimal plaque along the carotid arteries as outlined above.  Continue ASA and statin therapy.  3. Syncope - Her son reports she has experienced at least 3 syncopal episodes within the past year. Prior monitoring in 2021 showed no significant arrhythmias. Would recommend a repeat monitor as an outpatient for further assessment.  4. Hypokalemia - K+ at 3.1 this AM. Supplementation already administered today. Recheck BMET in AM. Hydrochlorothiazide  held.    For questions or updates, please contact [facility] Please consult [email or web address] for contact info under    Signed, Laymon CHRISTELLA Qua, PA-C  06/27/2024 9:41 AM

## 2024-06-27 NOTE — Interval H&P Note (Signed)
 History and Physical Interval Note:  06/27/2024 4:15 PM  Jody Wilson  has presented today for surgery, with the diagnosis of nstemi.  The various methods of treatment have been discussed with the patient and family. After consideration of risks, benefits and other options for treatment, the patient has consented to  Procedure(s): LEFT HEART CATH AND CORONARY ANGIOGRAPHY (N/A) as a surgical intervention.  The patient's history has been reviewed, patient examined, no change in status, stable for surgery.  I have reviewed the patient's chart and labs.  Questions were answered to the patient's satisfaction.    Cath Lab Visit (complete for each Cath Lab visit)  Clinical Evaluation Leading to the Procedure:   ACS: Yes.    Non-ACS:    Anginal Classification: CCS III  Anti-ischemic medical therapy: No Therapy  Non-Invasive Test Results: No non-invasive testing performed  Prior CABG: No previous CABG        [name]

## 2024-06-27 NOTE — H&P (View-Only) (Signed)
 Cardiology Consultation   Patient ID: Jody Wilson MRN: [id number]; DOB: 10/18/1951  Admit date: 06/26/2024 Date of Consult: 06/27/2024  PCP:  Dettinger, Fonda LABOR, MD   [facility] Providers Cardiologist:  Madonna Large, DO        Patient Profile: Jody Wilson is a 73 y.o. female with a hx of HTN, HLD, carotid artery disease and history of chest pain (false positive NST in 07/2020 with cath in 09/2020 showing normal coronary arteries) who is being seen 06/27/2024 for the evaluation of syncope and elevated troponin values at the request of Dr. Charlton.  History of Present Illness: [name] was examined by Dr. Large in 03/2024 and denied any recent anginal symptoms at that time. Was continued on her current cardiac medications of ASA 81 mg twice daily, Zetia  10 mg daily, HCTZ 12.5 mg daily, Olmesartan  20 mg daily and Pravastatin  40 mg daily.  The patient presented to [name] yesterday evening after having a syncopal episode. She reported remembering getting out of her car but woke up on the ground with EMS present. Reported having a possible prior syncopal event in 03/2024 and at that time she had suffered a left hip femoral neck fracture and underwent ORIF.  By review of notes from that hospitalization, she was unsure if she had lost consciousness prior to the event.  Was hypoxic while in the ED and improved with application of nasal cannula. Initial labs this admission showed WBC 9.2, Hgb 14.0, platelets 254, Na+ 139, K+ 3.2 and creatinine 0.96. D-dimer mildly elevated at 0.55. Initial and repeat Hs Troponin values elevated at 588, 1116, 1221 and 865. CXR with no active disease. Noted to have a moderate to large hiatal hernia. CT Head with no acute intracranial abnormalities. EKG shows normal sinus rhythm, heart rate 78 with no acute ST changes.  The ED provider did review with the cardiology fellow overnight and the patient was started on IV Heparin . Was recommended to be  admitted to [facility] but still awaiting a bed.  In talking with the patient and her son today, most history is provided by her son as she keeps falling asleep as she did not sleep well overnight. Her husband is currently admitted to the hospital. She was here visiting him yesterday and reports not consuming any liquids or food throughout the day. Upon driving home, she started walking to the mailbox and lost consciousness. Reports feeling a tremor sensation along her upper arms and then lost consciousness. No associated chest pain or palpitations. A neighbor witnessed the episode and called EMS. Her son believes she lost consciousness for total of 5 to 10 minutes. Reports she had altered mental status afterwards and is unable to recall the name of family members. No bowel or bladder incontinence.   Past Medical History:  Diagnosis Date   Anemia    Anxiety    Aortic atherosclerosis (HCC)    Carotid artery disease (HCC)    Depression    GERD (gastroesophageal reflux disease)    Hyperlipidemia    Hypertension    Orthostatic hypotension    Osteoporosis     Past Surgical History:  Procedure Laterality Date   ABDOMINAL HYSTERECTOMY     BACK SURGERY     CHOLECYSTECTOMY     FOOT SURGERY     7 in the past   HIP ARTHROPLASTY Right 02/11/2023   Procedure: ARTHROPLASTY BIPOLAR HIP (HEMIARTHROPLASTY);  Surgeon: Onesimo Oneil LABOR, MD;  Location: [facility];  Service: Orthopedics;  Laterality: Right;   HIP ARTHROPLASTY Left 04/13/2024   Procedure: HEMIARTHROPLASTY (BIPOLAR) HIP, DIRECT LATERAL  FOR FRACTURE;  Surgeon: Margrette Taft BRAVO, MD;  Location: [facility];  Service: Orthopedics;  Laterality: Left;   IR GENERIC HISTORICAL  12/09/2016   IR RADIOLOGIST EVAL & MGMT 12/09/2016 [facility]   IR GENERIC HISTORICAL  12/10/2016   IR VERTEBROPLASTY CERV/THOR BX INC UNI/BIL INC/INJECT/IMAGING 12/10/2016 [facility]   LEFT HEART CATH AND CORONARY ANGIOGRAPHY N/A 09/30/2020   Procedure: LEFT HEART CATH AND  CORONARY ANGIOGRAPHY;  Surgeon: Elmira Newman PARAS, MD;  Location: [facility];  Service: Cardiovascular;  Laterality: N/A;   TRACHEOSTOMY TUBE PLACEMENT  1982     Home Medications:  Prior to Admission medications   Medication Sig Start Date End Date Taking? Authorizing Provider  ALPRAZolam  (XANAX ) 0.5 MG tablet Take 1 tablet (0.5 mg total) by mouth 2 (two) times daily as needed for anxiety. 04/16/24  Yes Johnson, Clanford L, MD  aspirin  EC 81 MG tablet TAKE 1 PO BID THRU 05/13/24 THEN RESUME 1 PO DAILY 04/16/24  Yes Johnson, Clanford L, MD  buPROPion  (WELLBUTRIN  XL) 150 MG 24 hr tablet Take 1 tablet (150 mg total) by mouth daily. 06/04/24  Yes Dettinger, Fonda LABOR, MD  citalopram  (CELEXA ) 40 MG tablet Take 1 tablet (40 mg total) by mouth daily. 06/04/24  Yes Dettinger, Fonda LABOR, MD  cyanocobalamin  (VITAMIN B12) 1000 MCG/ML injection INJECT 1 ML IM EVERY 28 DAYS 10/31/23  Yes Rogers Hai, MD  docusate sodium  (COLACE) 100 MG capsule Take 1 capsule (100 mg total) by mouth 2 (two) times daily. 04/16/24  Yes Johnson, Clanford L, MD  ergocalciferol  (VITAMIN D2) 1.25 MG (50000 UT) capsule Take 1 capsule (50,000 Units total) by mouth every 14 (fourteen) days. 07/07/23  Yes Pennington, Rebekah M, PA-C  ezetimibe  (ZETIA ) 10 MG tablet Take 10 mg by mouth daily.   Yes [provider]  feeding supplement (ENSURE ENLIVE / ENSURE PLUS) LIQD Take 237 mLs by mouth 2 (two) times daily between meals. 04/17/24  Yes Johnson, Clanford L, MD  folic acid  (FOLVITE ) 1 MG tablet Take 1 mg by mouth every other day.   Yes [provider]  methocarbamol  (ROBAXIN ) 500 MG tablet Take 1 tablet (500 mg total) by mouth 3 (three) times daily. 05/17/24  Yes Margrette Taft BRAVO, MD  Multiple Vitamin (MULTIVITAMIN WITH MINERALS) TABS tablet Take 1 tablet by mouth daily. 04/17/24  Yes Johnson, Clanford L, MD  olmesartan  (BENICAR ) 20 MG tablet Take 0.5 tablets (10 mg total) by mouth daily. 05/08/24  Yes [facility] Morton Hummer, Nena, NP  ondansetron  (ZOFRAN ) 4 MG tablet Take 1 tablet (4 mg total) by mouth every 8 (eight) hours as needed for nausea or vomiting. 05/08/24  Yes Francesca Elsie CROME, MD  pantoprazole  (PROTONIX ) 40 MG tablet Take 1 tablet (40 mg total) by mouth 2 (two) times daily before a meal. 06/04/24  Yes Dettinger, Fonda LABOR, MD  polyethylene glycol (MIRALAX  / GLYCOLAX ) 17 g packet Take 17 g by mouth daily as needed for mild constipation. 04/16/24  Yes Johnson, Clanford L, MD  pravastatin  (PRAVACHOL ) 40 MG tablet Take 1 tablet (40 mg total) by mouth every evening. 06/04/24  Yes Dettinger, Fonda LABOR, MD    Scheduled Meds:  aspirin  EC  81 mg Oral Daily   buPROPion   150 mg Oral Daily   citalopram   40 mg Oral Daily   irbesartan   75 mg Oral Daily   pantoprazole   40 mg  Oral BID AC   pravastatin   40 mg Oral QPM   sodium chloride  flush  3 mL Intravenous Q12H   Continuous Infusions:  sodium chloride  75 mL/hr at 06/27/24 0208   heparin  950 Units/hr (06/27/24 0851)   PRN Meds: acetaminophen  **OR** acetaminophen , ALPRAZolam , ondansetron  **OR** ondansetron  (ZOFRAN ) IV, oxyCODONE , polyethylene glycol  Allergies:    Allergies  Allergen Reactions   Tetracyclines & Related Itching and Swelling    Social History:   Social History   Socioeconomic History   Marital status: Married    Spouse name: Donnie   Number of children: 2   Years of education: 12   Highest education level: Associate degree: occupational, Scientist, product/process development, or vocational program  Occupational History   Occupation: retired    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  Tobacco Use   Smoking status: Former    Current packs/day: 0.00    Average packs/day: 0.3 packs/day for 25.0 years (6.3 ttl pk-yrs)    Types: Cigarettes    Start date: 02/1998    Quit date: 02/2023    Years since quitting: 1.3    Passive exposure: Never   Smokeless tobacco: Never   Tobacco comments:    Trying to cut on  number of cigarettes she smokes.  Vaping Use   Vaping status: Never Used  Substance and Sexual Activity   Alcohol use: Not Currently    Comment: occassionally    Drug use: No   Sexual activity: Not on file  Other Topics Concern   Not on file  Social History Narrative   Lives with her husband, Jenene, and has two grown sons and two grand sons.   She visits family and has a once weekly outing with a friend for socializing.  She enjoys making beaded jewelry, reading and walking.   Social Drivers of Corporate investment banker Strain: Low Risk  (12/27/2023)   Overall Financial Resource Strain (CARDIA)    Difficulty of Paying Living Expenses: Not hard at all  Food Insecurity: No Food Insecurity (04/13/2024)   Hunger Vital Sign    Worried About Running Out of Food in the Last Year: Never true    Ran Out of Food in the Last Year: Never true  Transportation Needs: No Transportation Needs (04/13/2024)   PRAPARE - Administrator, Civil Service (Medical): No    Lack of Transportation (Non-Medical): No  Physical Activity: Inactive (12/27/2023)   Exercise Vital Sign    Days of Exercise per Week: 0 days    Minutes of Exercise per Session: 0 min  Stress: No Stress Concern Present (12/27/2023)   [employer] of Occupational Health - Occupational Stress Questionnaire    Feeling of Stress : Not at all  Social Connections: Socially Integrated (04/13/2024)   Social Connection and Isolation Panel    Frequency of Communication with Friends and Family: More than three times a week    Frequency of Social Gatherings with Friends and Family: More than three times a week    Attends Religious Services: More than 4 times per year    Active Member of [employer] or Organizations: Yes    Attends Engineer, structural: More than 4 times per year    Marital Status: Married  Catering manager Violence: Not At Risk (04/13/2024)   Humiliation, Afraid, Rape, and Kick questionnaire    Fear of Current or  Ex-Partner: No    Emotionally Abused: No    Physically Abused:  No    Sexually Abused: No    Family History:    Family History  Problem Relation Age of Onset   Heart disease Mother    Hypertension Mother    Hyperlipidemia Mother    Diabetes Mother    Congestive Heart Failure Mother    Aneurysm Paternal Uncle    Heart attack Maternal Grandmother    Heart attack Maternal Grandfather    Hypertension Son    Other Son 55       Kienbock's Disease   Liver disease Son 53       Gilbert's Disease   Lupus Half-Sister    Atrial fibrillation Half-Sister    Breast cancer Niece      ROS:  Please see the history of present illness.   All other ROS reviewed and negative.     Physical Exam/Data: Vitals:   06/27/24 0600 06/27/24 0630 06/27/24 0731 06/27/24 0800  BP: 136/80 (!) 146/86  (!) 147/92  Pulse: 69 80  67  Resp: 19 20  19   Temp:   97.9 F (36.6 C)   TempSrc:   Oral   SpO2: 95% 93%  92%  Weight:      Height:        Intake/Output Summary (Last 24 hours) at 06/27/2024 0941 Last data filed at 06/27/2024 9171 Gross per 24 hour  Intake 94.16 ml  Output --  Net 94.16 ml      06/26/2024    7:13 PM 05/23/2024    9:04 AM 05/08/2024   12:44 PM  Last 3 Weights  Weight (lbs) 141 lb 1.5 oz 140 lb 135 lb  Weight (kg) 64 kg 63.504 kg 61.236 kg     Body mass index is 23.48 kg/m.  General:  Well nourished, well developed female appearing in no acute distress HEENT: normal Neck: no JVD Vascular: No carotid bruits; Distal pulses 2+ bilaterally Cardiac:  normal S1, S2; RRR; no murmur  Lungs:  clear to auscultation bilaterally, no wheezing, rhonchi or rales  Abd: soft, nontender, no hepatomegaly  Ext: no pitting edema Musculoskeletal:  No deformities, BUE and BLE strength normal and equal Skin: warm and dry  Neuro:  CNs 2-12 intact, no focal abnormalities noted Psych:  Normal affect   EKG:  The EKG was personally reviewed and demonstrates: Normal sinus rhythm, heart rate 78 with no  acute ST changes.  Telemetry:  Telemetry was personally reviewed and demonstrates: NSR, HR in 80's to 90's.   Relevant CV Studies:  Cardiac Catheterization: 09/2020 Right dominant circulation Normal coronary arteries Normal LVEDP  Carotid Dopplers: 03/2023 Duplex suggests stenosis in the right internal carotid artery (minimal).  Duplex suggests stenosis in the left internal carotid artery (minimal).  Antegrade right vertebral artery flow. Antegrade left vertebral artery  flow.   Echocardiogram: 02/2024 IMPRESSIONS     1. Left ventricular ejection fraction, by estimation, is 60 to 65%. Left  ventricular ejection fraction by 3D volume is 60 %. The left ventricle has  normal function. The left ventricle has no regional wall motion  abnormalities. Left ventricular diastolic   parameters are consistent with Grade I diastolic dysfunction (impaired  relaxation). The average left ventricular global longitudinal strain is  -26.6 %. The global longitudinal strain is normal.   2. Right ventricular systolic function is normal. The right ventricular  size is normal. There is normal pulmonary artery systolic pressure. The  estimated right ventricular systolic pressure is 31.7 mmHg.   3. The mitral valve is normal in  structure. Mild mitral valve  regurgitation. No evidence of mitral stenosis.   4. The aortic valve is normal in structure. Aortic valve regurgitation is  not visualized. No aortic stenosis is present.   5. The inferior vena cava is normal in size with greater than 50%  respiratory variability, suggesting right atrial pressure of 3 mmHg.   Comparison(s): No significant change from prior study. Prior images  reviewed side by side.    Laboratory Data: High Sensitivity Troponin:   Recent Labs  Lab 06/26/24 2009 06/26/24 2211 06/27/24 0148 06/27/24 0617  TROPONINIHS 588* 1,116* 1,221* 865*     Chemistry Recent Labs  Lab 06/26/24 2009 06/27/24 0148 06/27/24 0617  NA  139  --  138  K 3.2*  --  3.1*  CL 103  --  105  CO2 25  --  23  GLUCOSE 112*  --  103*  BUN 9  --  8  CREATININE 0.96  --  0.70  CALCIUM  9.3  --  8.5*  MG  --  1.8  --   GFRNONAA >60  --  >60  ANIONGAP 11  --  10    Recent Labs  Lab 06/26/24 2009  PROT 7.4  ALBUMIN 4.0  AST 19  ALT 11  ALKPHOS 71  BILITOT 1.3*   Lipids No results for input(s): CHOL, TRIG, HDL, LABVLDL, LDLCALC, CHOLHDL in the last 168 hours.  Hematology Recent Labs  Lab 06/26/24 2009 06/27/24 0617  WBC 9.2 6.1  RBC 4.22 3.95  HGB 14.0 12.6  HCT 41.2 39.8  MCV 97.6 100.8*  MCH 33.2 31.9  MCHC 34.0 31.7  RDW 13.4 13.4  PLT 254 194   Thyroid  No results for input(s): TSH, FREET4 in the last 168 hours.  BNP Recent Labs  Lab 06/26/24 2009  BNP 56.0    DDimer  Recent Labs  Lab 06/26/24 2009  DDIMER 0.55*    Radiology/Studies:  DG Chest Portable 1 View Result Date: 06/26/2024 CLINICAL DATA:  Fall hypoxia EXAM: PORTABLE CHEST 1 VIEW COMPARISON:  04/13/2024 FINDINGS: Hardware in the cervical spine. Moderate to large hiatal hernia. No acute airspace disease, pleural effusion or pneumothorax. Stable cardiomediastinal silhouette with aortic atherosclerosis IMPRESSION: No active disease. Moderate to large hiatal hernia. Electronically Signed   By: Luke Bun M.D.   On: 06/26/2024 19:56   CT HEAD WO CONTRAST ( ) Result Date: 06/26/2024 CLINICAL DATA:  Head trauma, minor (Age >= 65y); Neck trauma (Age >= 65y) Fall EXAM: CT HEAD WITHOUT CONTRAST CT CERVICAL SPINE WITHOUT CONTRAST TECHNIQUE: Multidetector CT imaging of the head and cervical spine was performed following the standard protocol without intravenous contrast. Multiplanar CT image reconstructions of the cervical spine were also generated. RADIATION DOSE REDUCTION: This exam was performed according to the departmental dose-optimization program which includes automated exposure control, adjustment of the mA and/or kV according to  patient size and/or use of iterative reconstruction technique. COMPARISON:  CT head and C-spine 04/13/2024 FINDINGS: CT HEAD FINDINGS Brain: No evidence of large-territorial acute infarction. No parenchymal hemorrhage. No mass lesion. No extra-axial collection. No mass effect or midline shift. No hydrocephalus. Basilar cisterns are patent. Vascular: No hyperdense vessel. Atherosclerotic calcifications are present within the cavernous internal carotid and vertebral arteries. Skull: No acute fracture or focal lesion. Sinuses/Orbits: Left sphenoid sinus mucosal thickening. Left sphenoid wall hypertrophy. Otherwise paranasal sinuses and mastoid air cells are clear. The orbits are unremarkable. Other: None. CT CERVICAL SPINE FINDINGS Alignment: Normal. Skull base and vertebrae: C5-C7 anterior  cervical discectomy and fusion. No acute fracture. No aggressive appearing focal osseous lesion or focal pathologic process. Soft tissues and spinal canal: No prevertebral fluid or swelling. No visible canal hematoma. Upper chest: Biapical pleural/pulmonary scarring. Other: Atherosclerotic plaque of the carotid arteries within the neck. IMPRESSION: 1. No acute intracranial abnormality. 2. No acute displaced fracture or traumatic listhesis of the cervical spine. Electronically Signed   By: Morgane  Naveau M.D.   On: 06/26/2024 19:53   CT CERVICAL SPINE WO CONTRAST Result Date: 06/26/2024 CLINICAL DATA:  Head trauma, minor (Age >= 65y); Neck trauma (Age >= 65y) Fall EXAM: CT HEAD WITHOUT CONTRAST CT CERVICAL SPINE WITHOUT CONTRAST TECHNIQUE: Multidetector CT imaging of the head and cervical spine was performed following the standard protocol without intravenous contrast. Multiplanar CT image reconstructions of the cervical spine were also generated. RADIATION DOSE REDUCTION: This exam was performed according to the departmental dose-optimization program which includes automated exposure control, adjustment of the mA and/or kV  according to patient size and/or use of iterative reconstruction technique. COMPARISON:  CT head and C-spine 04/13/2024 FINDINGS: CT HEAD FINDINGS Brain: No evidence of large-territorial acute infarction. No parenchymal hemorrhage. No mass lesion. No extra-axial collection. No mass effect or midline shift. No hydrocephalus. Basilar cisterns are patent. Vascular: No hyperdense vessel. Atherosclerotic calcifications are present within the cavernous internal carotid and vertebral arteries. Skull: No acute fracture or focal lesion. Sinuses/Orbits: Left sphenoid sinus mucosal thickening. Left sphenoid wall hypertrophy. Otherwise paranasal sinuses and mastoid air cells are clear. The orbits are unremarkable. Other: None. CT CERVICAL SPINE FINDINGS Alignment: Normal. Skull base and vertebrae: C5-C7 anterior cervical discectomy and fusion. No acute fracture. No aggressive appearing focal osseous lesion or focal pathologic process. Soft tissues and spinal canal: No prevertebral fluid or swelling. No visible canal hematoma. Upper chest: Biapical pleural/pulmonary scarring. Other: Atherosclerotic plaque of the carotid arteries within the neck. IMPRESSION: 1. No acute intracranial abnormality. 2. No acute displaced fracture or traumatic listhesis of the cervical spine. Electronically Signed   By: Morgane  Naveau M.D.   On: 06/26/2024 19:53     Assessment and Plan:  1. NSTEMI - Presented after having a syncopal episode as described above but denies any associated chest pain or shortness of breath. Hs troponin values found to be elevated up to 1221 and downtrending to 865 on most recent check. - Given her significant enzyme elevation and presentation of syncope, may need to consider a relook cardiac catheterization. Would be hesitant to pursue a repeat NST given her history of a false positive NST in 07/2020.  Echocardiogram was performed this morning with the official report pending. If found to have a new cardiomyopathy  or wall motion abnormalities, would recommend a cardiac catheterization for definitive evaluation. - Continue IV Heparin  for now along with ASA 81 mg daily and Pravastatin  40 mg daily. Will recheck an FLP.  2. Carotid Artery Disease - Dopplers in 03/2023 showed minimal plaque along the carotid arteries as outlined above.  Continue ASA and statin therapy.  3. Syncope - Her son reports she has experienced at least 3 syncopal episodes within the past year. Prior monitoring in 2021 showed no significant arrhythmias. Would recommend a repeat monitor as an outpatient for further assessment.  4. Hypokalemia - K+ at 3.1 this AM. Supplementation already administered today. Recheck BMET in AM. Hydrochlorothiazide  held.    For questions or updates, please contact [facility] Please consult [email or web address] for contact info under    Signed, Laymon CHRISTELLA Qua, PA-C  06/27/2024 9:41 AM

## 2024-06-27 NOTE — H&P (Signed)
 History and Physical    Jody Wilson FMW:[id number] DOB: June 04, 1951 DOA: 06/26/2024  PCP: Dettinger, Fonda LABOR, MD   Patient coming from: Home   Chief Complaint: Syncope   HPI: Jody Wilson is a 73 y.o. female with medical history significant for hypertension, hyperlipidemia, depression, and anxiety who presents after a syncopal episode with fall.  Patient reports that she was in her usual state of health and having an uneventful day when she got out of her car and then remembers waking up on the ground with EMS.  She denies chest pain, shortness of breath, nausea, vomiting, diaphoresis, leg swelling, or leg tenderness.  ED Course: Upon arrival to the ED, patient is found to be afebrile and saturating mid 80s on room air with normal HR and stable BP.  Labs are most notable for potassium 3.2, normal creatinine, normal WBC, normal hemoglobin, D-dimer 0.55, normal BNP, and troponin 588 which increased to 1116.  EKG demonstrates sinus arrhythmia with PVC.  There are no acute findings on chest x-ray, head CT, or cervical spine CT.  Cardiology was consulted by the ED physician and recommended medical admission to [facility].  Patient was treated with Xanax  and 325 mg of aspirin  in the ED and was started on IV heparin .  Review of Systems:  All other systems reviewed and apart from HPI, are negative.  Past Medical History:  Diagnosis Date   Anemia    Anxiety    Aortic atherosclerosis (HCC)    Carotid artery disease (HCC)    Depression    GERD (gastroesophageal reflux disease)    Hyperlipidemia    Hypertension    Orthostatic hypotension    Osteoporosis     Past Surgical History:  Procedure Laterality Date   ABDOMINAL HYSTERECTOMY     BACK SURGERY     CHOLECYSTECTOMY     FOOT SURGERY     7 in the past   HIP ARTHROPLASTY Right 02/11/2023   Procedure: ARTHROPLASTY BIPOLAR HIP (HEMIARTHROPLASTY);  Surgeon: Onesimo Oneil LABOR, MD;  Location: [facility];  Service: Orthopedics;   Laterality: Right;   HIP ARTHROPLASTY Left 04/13/2024   Procedure: HEMIARTHROPLASTY (BIPOLAR) HIP, DIRECT LATERAL  FOR FRACTURE;  Surgeon: Margrette Taft BRAVO, MD;  Location: [facility];  Service: Orthopedics;  Laterality: Left;   IR GENERIC HISTORICAL  12/09/2016   IR RADIOLOGIST EVAL & MGMT 12/09/2016 [facility]   IR GENERIC HISTORICAL  12/10/2016   IR VERTEBROPLASTY CERV/THOR BX INC UNI/BIL INC/INJECT/IMAGING 12/10/2016 [facility]   LEFT HEART CATH AND CORONARY ANGIOGRAPHY N/A 09/30/2020   Procedure: LEFT HEART CATH AND CORONARY ANGIOGRAPHY;  Surgeon: Elmira Newman PARAS, MD;  Location: [facility];  Service: Cardiovascular;  Laterality: N/A;   TRACHEOSTOMY TUBE PLACEMENT  1982    Social History:   reports that she quit smoking about 16 months ago. Her smoking use included cigarettes. She started smoking about 26 years ago. She has a 6.3 pack-year smoking history. She has never been exposed to tobacco smoke. She has never used smokeless tobacco. She reports that she does not currently use alcohol. She reports that she does not use drugs.  Allergies  Allergen Reactions   Tetracyclines & Related Itching and Swelling    Family History  Problem Relation Age of Onset   Heart disease Mother    Hypertension Mother    Hyperlipidemia Mother    Diabetes Mother    Congestive Heart Failure Mother    Aneurysm Paternal Uncle    Heart  attack Maternal Grandmother    Heart attack Maternal Grandfather    Hypertension Son    Other Son 49       Kienbock's Disease   Liver disease Son 40       Gilbert's Disease   Lupus Half-Sister    Atrial fibrillation Half-Sister    Breast cancer Niece      Prior to Admission medications   Medication Sig Start Date End Date Taking? Authorizing Provider  ALPRAZolam  (XANAX ) 0.5 MG tablet Take 1 tablet (0.5 mg total) by mouth 2 (two) times daily as needed for anxiety. 04/16/24   Vicci Afton CROME, MD  aspirin  EC 81 MG tablet TAKE 1 PO BID THRU  05/13/24 THEN RESUME 1 PO DAILY 04/16/24   Johnson, Clanford L, MD  buPROPion  (WELLBUTRIN  XL) 150 MG 24 hr tablet Take 1 tablet (150 mg total) by mouth daily. 06/04/24   Dettinger, Fonda LABOR, MD  calcium -vitamin D  (OSCAL WITH D) 500-200 MG-UNIT tablet Take 1 tablet by mouth daily with lunch.     [provider]  citalopram  (CELEXA ) 40 MG tablet Take 1 tablet (40 mg total) by mouth daily. 06/04/24   Dettinger, Fonda LABOR, MD  cyanocobalamin  (VITAMIN B12) 1000 MCG/ML injection INJECT 1 ML IM EVERY 28 DAYS 10/31/23   Rogers Hai, MD  docusate sodium  (COLACE) 100 MG capsule Take 1 capsule (100 mg total) by mouth 2 (two) times daily. 04/16/24   Johnson, Clanford L, MD  ergocalciferol  (VITAMIN D2) 1.25 MG (50000 UT) capsule Take 1 capsule (50,000 Units total) by mouth every 14 (fourteen) days. 07/07/23   Lamon Pleasant HERO, PA-C  ezetimibe  (ZETIA ) 10 MG tablet Take 1 tablet (10 mg total) by mouth daily. 01/04/24   Dettinger, Fonda LABOR, MD  feeding supplement (ENSURE ENLIVE / ENSURE PLUS) LIQD Take 237 mLs by mouth 2 (two) times daily between meals. 04/17/24   Vicci Afton CROME, MD  ferrous sulfate  325 (65 FE) MG tablet Take 1 tablet (325 mg total) by mouth daily with breakfast. 05/08/24   Deitra Morton Hummer, Nena, NP  folic acid  (FOLVITE ) 1 MG tablet Take 1 mg by mouth every other day.    [provider]  HYDROcodone -acetaminophen  (NORCO/VICODIN) 5-325 MG tablet Take 1 tablet by mouth every 6 (six) hours as needed. 05/01/24   [provider]  methocarbamol  (ROBAXIN ) 500 MG tablet Take 1 tablet (500 mg total) by mouth 3 (three) times daily. 05/17/24   Margrette Taft BRAVO, MD  Multiple Vitamin (MULTIVITAMIN WITH MINERALS) TABS tablet Take 1 tablet by mouth daily. 04/17/24   Johnson, Clanford L, MD  olmesartan  (BENICAR ) 20 MG tablet Take 0.5 tablets (10 mg total) by mouth daily. 05/08/24   St Morton Hummer Nena, NP  ondansetron  (ZOFRAN ) 4 MG tablet Take 1 tablet (4 mg total) by  mouth every 8 (eight) hours as needed for nausea or vomiting. 05/08/24   Francesca Elsie CROME, MD  pantoprazole  (PROTONIX ) 40 MG tablet Take 1 tablet (40 mg total) by mouth 2 (two) times daily before a meal. 06/04/24   Dettinger, Fonda LABOR, MD  polyethylene glycol (MIRALAX  / GLYCOLAX ) 17 g packet Take 17 g by mouth daily as needed for mild constipation. 04/16/24   Johnson, Clanford L, MD  pravastatin  (PRAVACHOL ) 40 MG tablet Take 1 tablet (40 mg total) by mouth every evening. 06/04/24   Dettinger, Fonda LABOR, MD    Physical Exam: Vitals:   06/26/24 2130 06/26/24 2135 06/26/24 2230 06/26/24 2300  BP: (!) 124/107  (!) 149/91  102/64  Pulse: 80 84 88 89  Resp: (!) 24 (!) 21 20 19   Temp:    98 F (36.7 C)  TempSrc:      SpO2: 98% 96% 96% 96%  Weight:      Height:        Constitutional: NAD, no pallor or diaphoresis   Eyes: PERTLA, lids and conjunctivae normal ENMT: Mucous membranes are moist. Posterior pharynx clear of any exudate or lesions.   Neck: supple, no masses  Respiratory: no wheezing, no crackles. No accessory muscle use.  Cardiovascular: S1 & S2 heard, regular rate and rhythm. No extremity edema.  Abdomen: No tenderness, soft. Bowel sounds active.  Musculoskeletal: no clubbing / cyanosis. No joint deformity upper and lower extremities.   Skin: no significant rashes, lesions, ulcers. Warm, dry, well-perfused. Neurologic: CN 2-12 grossly intact. Moving all extremities. Alert and oriented.  Psychiatric: Calm. Cooperative.    Labs and Imaging on Admission: I have personally reviewed following labs and imaging studies  CBC: Recent Labs  Lab 06/26/24 2009  WBC 9.2  NEUTROABS 7.3  HGB 14.0  HCT 41.2  MCV 97.6  PLT 254   Basic Metabolic Panel: Recent Labs  Lab 06/26/24 2009  NA 139  K 3.2*  CL 103  CO2 25  GLUCOSE 112*  BUN 9  CREATININE 0.96  CALCIUM  9.3   GFR: Estimated Creatinine Clearance: 47.7 mL/min (by C-G formula based on SCr of 0.96 mg/dL). Liver Function  Tests: Recent Labs  Lab 06/26/24 2009  AST 19  ALT 11  ALKPHOS 71  BILITOT 1.3*  PROT 7.4  ALBUMIN 4.0   No results for input(s): LIPASE, AMYLASE in the last 168 hours. No results for input(s): AMMONIA in the last 168 hours. Coagulation Profile: No results for input(s): INR, PROTIME in the last 168 hours. Cardiac Enzymes: No results for input(s): CKTOTAL, CKMB, CKMBINDEX, TROPONINI in the last 168 hours. BNP (last 3 results) No results for input(s): PROBNP in the last 8760 hours. HbA1C: No results for input(s): HGBA1C in the last 72 hours. CBG: No results for input(s): GLUCAP in the last 168 hours. Lipid Profile: No results for input(s): CHOL, HDL, LDLCALC, TRIG, CHOLHDL, LDLDIRECT in the last 72 hours. Thyroid  Function Tests: No results for input(s): TSH, T4TOTAL, FREET4, T3FREE, THYROIDAB in the last 72 hours. Anemia Panel: No results for input(s): VITAMINB12, FOLATE, FERRITIN, TIBC, IRON, RETICCTPCT in the last 72 hours. Urine analysis:    Component Value Date/Time   COLORURINE YELLOW 07/11/2020 2212   APPEARANCEUR Clear 01/22/2022 1411   LABSPEC 1.008 07/11/2020 2212   PHURINE 6.0 07/11/2020 2212   GLUCOSEU Negative 01/22/2022 1411   HGBUR NEGATIVE 07/11/2020 2212   BILIRUBINUR Negative 01/22/2022 1411   KETONESUR NEGATIVE 07/11/2020 2212   PROTEINUR Negative 01/22/2022 1411   PROTEINUR NEGATIVE 07/11/2020 2212   UROBILINOGEN negative 04/11/2014 1713   NITRITE Negative 01/22/2022 1411   NITRITE NEGATIVE 07/11/2020 2212   LEUKOCYTESUR Negative 01/22/2022 1411   LEUKOCYTESUR NEGATIVE 07/11/2020 2212   Sepsis Labs: @LABRCNTIP (procalcitonin:4,lacticidven:4) )No results found for this or any previous visit (from the past 240 hours).   Radiological Exams on Admission: DG Chest Portable 1 View Result Date: 06/26/2024 CLINICAL DATA:  Fall hypoxia EXAM: PORTABLE CHEST 1 VIEW COMPARISON:  04/13/2024 FINDINGS:  Hardware in the cervical spine. Moderate to large hiatal hernia. No acute airspace disease, pleural effusion or pneumothorax. Stable cardiomediastinal silhouette with aortic atherosclerosis IMPRESSION: No active disease. Moderate to large hiatal hernia. Electronically Signed   By: Luke  Scott M.D.   On: 06/26/2024 19:56   CT HEAD WO CONTRAST ( ) Result Date: 06/26/2024 CLINICAL DATA:  Head trauma, minor (Age >= 65y); Neck trauma (Age >= 65y) Fall EXAM: CT HEAD WITHOUT CONTRAST CT CERVICAL SPINE WITHOUT CONTRAST TECHNIQUE: Multidetector CT imaging of the head and cervical spine was performed following the standard protocol without intravenous contrast. Multiplanar CT image reconstructions of the cervical spine were also generated. RADIATION DOSE REDUCTION: This exam was performed according to the departmental dose-optimization program which includes automated exposure control, adjustment of the mA and/or kV according to patient size and/or use of iterative reconstruction technique. COMPARISON:  CT head and C-spine 04/13/2024 FINDINGS: CT HEAD FINDINGS Brain: No evidence of large-territorial acute infarction. No parenchymal hemorrhage. No mass lesion. No extra-axial collection. No mass effect or midline shift. No hydrocephalus. Basilar cisterns are patent. Vascular: No hyperdense vessel. Atherosclerotic calcifications are present within the cavernous internal carotid and vertebral arteries. Skull: No acute fracture or focal lesion. Sinuses/Orbits: Left sphenoid sinus mucosal thickening. Left sphenoid wall hypertrophy. Otherwise paranasal sinuses and mastoid air cells are clear. The orbits are unremarkable. Other: None. CT CERVICAL SPINE FINDINGS Alignment: Normal. Skull base and vertebrae: C5-C7 anterior cervical discectomy and fusion. No acute fracture. No aggressive appearing focal osseous lesion or focal pathologic process. Soft tissues and spinal canal: No prevertebral fluid or swelling. No visible canal  hematoma. Upper chest: Biapical pleural/pulmonary scarring. Other: Atherosclerotic plaque of the carotid arteries within the neck. IMPRESSION: 1. No acute intracranial abnormality. 2. No acute displaced fracture or traumatic listhesis of the cervical spine. Electronically Signed   By: Morgane  Naveau M.D.   On: 06/26/2024 19:53   CT CERVICAL SPINE WO CONTRAST Result Date: 06/26/2024 CLINICAL DATA:  Head trauma, minor (Age >= 65y); Neck trauma (Age >= 65y) Fall EXAM: CT HEAD WITHOUT CONTRAST CT CERVICAL SPINE WITHOUT CONTRAST TECHNIQUE: Multidetector CT imaging of the head and cervical spine was performed following the standard protocol without intravenous contrast. Multiplanar CT image reconstructions of the cervical spine were also generated. RADIATION DOSE REDUCTION: This exam was performed according to the departmental dose-optimization program which includes automated exposure control, adjustment of the mA and/or kV according to patient size and/or use of iterative reconstruction technique. COMPARISON:  CT head and C-spine 04/13/2024 FINDINGS: CT HEAD FINDINGS Brain: No evidence of large-territorial acute infarction. No parenchymal hemorrhage. No mass lesion. No extra-axial collection. No mass effect or midline shift. No hydrocephalus. Basilar cisterns are patent. Vascular: No hyperdense vessel. Atherosclerotic calcifications are present within the cavernous internal carotid and vertebral arteries. Skull: No acute fracture or focal lesion. Sinuses/Orbits: Left sphenoid sinus mucosal thickening. Left sphenoid wall hypertrophy. Otherwise paranasal sinuses and mastoid air cells are clear. The orbits are unremarkable. Other: None. CT CERVICAL SPINE FINDINGS Alignment: Normal. Skull base and vertebrae: C5-C7 anterior cervical discectomy and fusion. No acute fracture. No aggressive appearing focal osseous lesion or focal pathologic process. Soft tissues and spinal canal: No prevertebral fluid or swelling. No visible  canal hematoma. Upper chest: Biapical pleural/pulmonary scarring. Other: Atherosclerotic plaque of the carotid arteries within the neck. IMPRESSION: 1. No acute intracranial abnormality. 2. No acute displaced fracture or traumatic listhesis of the cervical spine. Electronically Signed   By: Morgane  Naveau M.D.   On: 06/26/2024 19:53    EKG: Independently reviewed. Sinus arrhythmia, PVC.   Assessment/Plan   1. Syncope  - Check orthostatic vitals, continue cardiac monitoring, check echocardiogram    2. Elevated troponin  - Troponin is elevated and increasing  after a syncopal episode with no anginal symptoms, pt completely asymptomatic in ED    - Continue cardiac monitoring, trend troponin, continue IV heparin  for now, check echocardiogram   3. Acute hypoxic respiratory failure  - Requiring 2 Lpm supplemental O2 in ED  - CXR clear and exam unremarkable; age-adjusted d-dimer is negative  - Start incentive spirometry, continue supplemental O2 as-needed, follow-up echo findings, consider CTA chest if hypoxic persists   4. Hypokalemia  - Replacing   5. Hypertension  - Continue ARB   6. Hyperlipidemia  - Continue statin   7. Depression, anxiety  - Continue Wellbutrin , Celexa , and as-needed Xanax      DVT prophylaxis: IV heparin   Code Status: Full  Level of Care: Level of care: Progressive Family Communication: none present  Disposition Plan:  Patient is from: Home  Anticipated d/c is to: TBD Anticipated d/c date is: TBD Patient currently: Pending orthostatic vitals, cardiac monitoring, echocardiogram, cardiology consultation  Consults called: Cardiology  Admission status: Observation     Evalene GORMAN Sprinkles, MD [facility]  06/27/2024, 1:32 AM

## 2024-06-27 NOTE — ED Notes (Signed)
Pt having echo at this time

## 2024-06-27 NOTE — Progress Notes (Addendum)
   06/27/24 1133  TOC Brief Assessment  Insurance and Status Reviewed  Patient has primary care physician Yes  Home environment has been reviewed Home  Prior level of function: Independent  Prior/Current Home Services No current home services  Social Drivers of Health Review SDOH reviewed no interventions necessary  Readmission risk has been reviewed Yes  Transition of care needs no transition of care needs at this time   Orders to transfer to [facility] for [facility] Cath.  Transition of Care Department [facility]) has reviewed patient and no TOC needs have been identified at this time. We will continue to monitor patient advancement through interdisciplinary progression rounds. If new patient transition needs arise, please place a TOC consult.

## 2024-06-27 NOTE — Progress Notes (Signed)
 PHARMACY - ANTICOAGULATION CONSULT NOTE  Pharmacy Consult for heparin  Indication: chest pain/ACS  Allergies  Allergen Reactions   Tetracyclines & Related Itching and Swelling    Patient Measurements: Height: 5' 5 (165.1 cm) Weight: 64 kg (141 lb 1.5 oz) IBW/kg (Calculated) : 57 HEPARIN  DW (KG): 64  Vital Signs: Temp: 97.9 F (36.6 C) (07/09 0731) Temp Source: Oral (07/09 0731) BP: 146/86 (07/09 0630) Pulse Rate: 80 (07/09 0630)  Labs: Recent Labs    06/26/24 2009 06/26/24 2211 06/27/24 0148 06/27/24 0617 06/27/24 0735  HGB 14.0  --   --  12.6  --   HCT 41.2  --   --  39.8  --   PLT 254  --   --  194  --   HEPARINUNFRC  --   --   --   --  0.19*  CREATININE 0.96  --   --  0.70  --   TROPONINIHS 588* 1,116* 1,221* 865*  --     Estimated Creatinine Clearance: 57.2 mL/min (by C-G formula based on SCr of 0.7 mg/dL).   Medical History: Past Medical History:  Diagnosis Date   Anemia    Anxiety    Aortic atherosclerosis (HCC)    Carotid artery disease (HCC)    Depression    GERD (gastroesophageal reflux disease)    Hyperlipidemia    Hypertension    Orthostatic hypotension    Osteoporosis     Assessment: 72yo female c/o falling in yard (reported by neighbor, pt doesn't remember), imaging shows no acute issues but during w/u troponin found to be elevated and rising >> to begin heparin .  HL 0.19- subtherapeutic Trop 1,116 > 865 CBC WNL  Goal of Therapy:  Heparin  level 0.3-0.7 units/ml Monitor platelets by anticoagulation protocol: Yes   Plan:  Rebolus 1500 units IV x 1 Increase heparin  infusion to 950 units/hr Heparin  level in 8 hours an daily Continue to monitor H&H and platelets   Elspeth Sour, PharmD Clinical Pharmacist 06/27/2024 8:34 AM

## 2024-06-27 NOTE — ED Notes (Signed)
 Patients son Donnie given an update

## 2024-06-27 NOTE — Progress Notes (Signed)
*  PRELIMINARY RESULTS* Echocardiogram 2D Echocardiogram has been performed.  Teresa Aida PARAS 06/27/2024, 9:37 AM

## 2024-06-27 NOTE — Progress Notes (Signed)
 PROGRESS NOTE    Jody Wilson  FMW:[id number] DOB: 23-May-1951 DOA: 06/26/2024 PCP: Dettinger, Fonda LABOR, MD   Brief Narrative:    Jody Wilson is a 73 y.o. female with medical history significant for hypertension, hyperlipidemia, depression, and anxiety who presents after a syncopal episode with fall.  In the ED she was noted to have significant elevation in troponin levels and cardiology is recommending transfer to [facility] for further evaluation with initiation of IV heparin  drip.  2D echocardiogram with LVEF 45% and new wall motion abnormalities.  Plans are for cardiac catheterization this afternoon.  Assessment & Plan:   Principal Problem:   Syncope Active Problems:   Hypertension   Hyperlipidemia with target LDL less than 100   Anxiety and depression   Hypokalemia   Elevated troponin   Acute respiratory failure with hypoxia (HCC)  Assessment and Plan:   1. Syncope likely in the setting of ACS - 2D echocardiogram with LVEF 45% and new wall motion abnormalities noted -Concern for ACS with plans for cardiac catheterization this afternoon -May need to consider CTA head and neck in the near future after catheterization if renal function stable   2. Elevated troponin with likely ACS - Appreciate cardiology evaluation with plans to transfer to [facility] for catheterization this afternoon   3. Acute hypoxic respiratory failure  - Requiring 2 Lpm supplemental O2 in ED  - CXR clear and exam unremarkable; age-adjusted d-dimer is negative  - Start incentive spirometry, continue supplemental O2 as-needed, follow-up echo findings, consider CTA chest if hypoxic persists    4. Hypokalemia  - Replacing  -Follow-up in a.m.   5. Hypertension  - Continue ARB    6. Hyperlipidemia  - Continue statin    7. Depression, anxiety  - Continue Wellbutrin , Celexa , and as-needed Xanax       DVT prophylaxis: Heparin  infusion Code Status: Full Family Communication: None at  bedside Disposition Plan: Transfer to [facility] for cardiac catheterization Status is: Observation The patient will require care spanning > 2 midnights and should be moved to inpatient because: Need for cardiac cath  Consultants:  EDP discussed with cardiology  Procedures:  None  Antimicrobials:  None   Subjective: Patient seen and evaluated today with no new acute complaints or concerns. No acute concerns or events noted overnight.  Objective: Vitals:   06/27/24 0600 06/27/24 0630 06/27/24 0731 06/27/24 0800  BP: 136/80 (!) 146/86  (!) 147/92  Pulse: 69 80  67  Resp: 19 20  19   Temp:   97.9 F (36.6 C)   TempSrc:   Oral   SpO2: 95% 93%  92%  Weight:      Height:        Intake/Output Summary (Last 24 hours) at 06/27/2024 1102 Last data filed at 06/27/2024 0828 Gross per 24 hour  Intake 94.16 ml  Output --  Net 94.16 ml   Filed Weights   06/26/24 1913  Weight: 64 kg    Examination:  General exam: Appears calm and comfortable  Respiratory system: Clear to auscultation. Respiratory effort normal.  Nasal cannula oxygen Cardiovascular system: S1 & S2 heard, RRR.  Gastrointestinal system: Abdomen is soft Central nervous system: Alert and awake Extremities: No edema Skin: No significant lesions noted Psychiatry: Flat affect.    Data Reviewed: I have personally reviewed following labs and imaging studies  CBC: Recent Labs  Lab 06/26/24 2009 06/27/24 0617  WBC 9.2 6.1  NEUTROABS 7.3  --   HGB 14.0 12.6  HCT 41.2 39.8  MCV 97.6 100.8*  PLT 254 194   Basic Metabolic Panel: Recent Labs  Lab 06/26/24 2009 06/27/24 0148 06/27/24 0617  NA 139  --  138  K 3.2*  --  3.1*  CL 103  --  105  CO2 25  --  23  GLUCOSE 112*  --  103*  BUN 9  --  8  CREATININE 0.96  --  0.70  CALCIUM  9.3  --  8.5*  MG  --  1.8  --    GFR: Estimated Creatinine Clearance: 57.2 mL/min (by C-G formula based on SCr of 0.7 mg/dL). Liver Function Tests: Recent Labs  Lab 06/26/24 2009   AST 19  ALT 11  ALKPHOS 71  BILITOT 1.3*  PROT 7.4  ALBUMIN 4.0   No results for input(s): LIPASE, AMYLASE in the last 168 hours. No results for input(s): AMMONIA in the last 168 hours. Coagulation Profile: No results for input(s): INR, PROTIME in the last 168 hours. Cardiac Enzymes: No results for input(s): CKTOTAL, CKMB, CKMBINDEX, TROPONINI in the last 168 hours. BNP (last 3 results) No results for input(s): PROBNP in the last 8760 hours. HbA1C: No results for input(s): HGBA1C in the last 72 hours. CBG: No results for input(s): GLUCAP in the last 168 hours. Lipid Profile: No results for input(s): CHOL, HDL, LDLCALC, TRIG, CHOLHDL, LDLDIRECT in the last 72 hours. Thyroid  Function Tests: No results for input(s): TSH, T4TOTAL, FREET4, T3FREE, THYROIDAB in the last 72 hours. Anemia Panel: No results for input(s): VITAMINB12, FOLATE, FERRITIN, TIBC, IRON, RETICCTPCT in the last 72 hours. Sepsis Labs: No results for input(s): PROCALCITON, LATICACIDVEN in the last 168 hours.  No results found for this or any previous visit (from the past 240 hours).       Radiology Studies: ECHOCARDIOGRAM COMPLETE Result Date: 06/27/2024    ECHOCARDIOGRAM REPORT   Patient Name:   Jody Wilson Date of Exam: 06/27/2024 Medical Rec #:  [id number]       Height:       65.0 in Accession #:    [id number]      Weight:       141.1 lb Date of Birth:  03-Dec-1951      BSA:          1.706 m Patient Age:    72 years        BP:           146/86 mmHg Patient Gender: F               HR:           80 bpm. Exam Location:  [name] Procedure: 2D Echo, 3D Echo, Cardiac Doppler, Color Doppler and Strain Analysis            (Both Spectral and Color Flow Doppler were utilized during            procedure). Indications:    Syncope R55 / Elevated Troponin  History:        Patient has prior history of Echocardiogram examinations, most                 recent  03/05/2024. Signs/Symptoms:Syncope; Risk                 Factors:Hypertension, Dyslipidemia and Tobacco abuse.  Sonographer:    Aida Pizza RCS Referring Phys: [id number] TIMOTHY S OPYD  Sonographer Comments: Global longitudinal strain was attempted. IMPRESSIONS  1. Left ventricular ejection fraction, by estimation,  is 45 to 50%. Left ventricular ejection fraction by 3D volume is 51 %. The left ventricle has mildly decreased function. 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 Left ventricular diastolic parameters are consistent with Grade I diastolic dysfunction (impaired relaxation). The average left ventricular global longitudinal strain is -13.4  %. The global longitudinal strain is abnormal.  2. Right ventricular systolic function is normal. The right ventricular size is normal. There is mildly elevated pulmonary artery systolic pressure. The estimated right ventricular systolic pressure is 37.6 mmHg.  3. The mitral valve is normal in structure. No evidence of mitral valve regurgitation. No evidence of mitral stenosis.  4. The aortic valve has an indeterminant number of cusps. Aortic valve regurgitation is not visualized. No aortic stenosis is present.  5. The inferior vena cava is normal in size with greater than 50% respiratory variability, suggesting right atrial pressure of 3 mmHg. Comparison(s): Prior images reviewed side by side. Changes from prior study are noted. LVEF mildly reduced to 45% now with new RWMA. FINDINGS  Left Ventricle: Left ventricular ejection fraction, by estimation, is 45 to 50%. Left ventricular ejection fraction by 3D volume is 51 %. The left ventricle has mildly decreased function. The left ventricle demonstrates regional wall motion abnormalities. The average left ventricular global longitudinal strain is -13.4 %. Strain was performed and the global longitudinal strain is  abnormal. The left ventricular internal cavity size was normal in size. There is no left ventricular hypertrophy. Left ventricular diastolic parameters are consistent with Grade I diastolic dysfunction (impaired relaxation). Normal left ventricular filling pressure.  LV Wall Scoring: The apical lateral segment, mid anterolateral segment, mid inferoseptal segment, apical septal segment, and apex are akinetic. The mid and distal anterior wall, anterior septum, and apical inferior segment are hypokinetic. The inferior wall, posterior wall, basal anterolateral segment, basal anterior segment, and basal inferoseptal segment are normal. Right Ventricle: The right ventricular size is normal. No increase in right ventricular wall thickness. Right ventricular systolic function is normal. There is mildly elevated pulmonary artery systolic pressure. The tricuspid regurgitant velocity is 2.94  m/s, and with an assumed right atrial pressure of 3 mmHg, the estimated right ventricular systolic pressure is 37.6 mmHg. Left Atrium: Left atrial size was normal in size. Right Atrium: Right atrial size was normal in size. Pericardium: There is no evidence of pericardial effusion. Mitral Valve: The mitral valve is normal in structure. No evidence of mitral valve regurgitation. No evidence of mitral valve stenosis. Tricuspid Valve: The tricuspid valve is normal in structure. Tricuspid valve regurgitation is mild . No evidence of tricuspid stenosis. Aortic Valve: The aortic valve has an indeterminant number of cusps. Aortic valve regurgitation is not visualized. No aortic stenosis is present. Pulmonic Valve: The pulmonic valve was not well visualized. Pulmonic valve regurgitation is not visualized. No evidence of pulmonic stenosis. Aorta: The aortic root is normal in size and structure. Venous: The inferior vena cava is normal in size with greater than 50% respiratory variability, suggesting right atrial pressure of 3 mmHg. IAS/Shunts: No  atrial level shunt detected by color flow Doppler. Additional Comments: 3D was performed not requiring image post processing on an independent workstation and was abnormal.  LEFT VENTRICLE PLAX 2D LVIDd:         4.50 cm         Diastology LVIDs:         2.90 cm  LV e' medial:    6.31 cm/s LV PW:         1.00 cm         LV E/e' medial:  14.4 LV IVS:        0.90 cm         LV e' lateral:   6.42 cm/s LVOT diam:     1.90 cm         LV E/e' lateral: 14.2 LV SV:         49 LV SV Index:   29              2D Longitudinal LVOT Area:     2.84 cm        Strain                                2D Strain GLS   -13.4 %                                Avg:                                 3D Volume EF                                LV 3D EF:    Left                                             ventricul                                             ar                                             ejection                                             fraction                                             by 3D                                             volume is                                             51 %.  3D Volume EF:                                3D EF:        51 %                                LV EDV:       109 ml                                LV ESV:       53 ml                                LV SV:        56 ml RIGHT VENTRICLE RV S prime:     15.00 cm/s TAPSE (M-mode): 2.2 cm LEFT ATRIUM             Index        RIGHT ATRIUM           Index LA diam:        2.70 cm 1.58 cm/m   RA Area:     13.60 cm LA Vol (A2C):   37.1 ml 21.75 ml/m  RA Volume:   32.20 ml  18.88 ml/m LA Vol (A4C):   45.4 ml 26.62 ml/m LA Biplane Vol: 41.6 ml 24.39 ml/m  AORTIC VALVE LVOT Vmax:   79.30 cm/s LVOT Vmean:  53.300 cm/s LVOT VTI:    0.172 m  AORTA Ao Root diam: 2.90 cm MITRAL VALVE                TRICUSPID VALVE MV Area (PHT): 5.13 cm     TR Peak grad:   34.6 mmHg MV Decel Time: 148 msec     TR Vmax:         294.00 cm/s MV E velocity: 91.10 cm/s MV A velocity: 114.00 cm/s  SHUNTS MV E/A ratio:  0.80         Systemic VTI:  0.17 m                             Systemic Diam: 1.90 cm Vishnu Priya Mallipeddi Electronically signed by Diannah Late Mallipeddi Signature Date/Time: 06/27/2024/10:44:04 AM    Final    DG Chest Portable 1 View Result Date: 06/26/2024 CLINICAL DATA:  Fall hypoxia EXAM: PORTABLE CHEST 1 VIEW COMPARISON:  04/13/2024 FINDINGS: Hardware in the cervical spine. Moderate to large hiatal hernia. No acute airspace disease, pleural effusion or pneumothorax. Stable cardiomediastinal silhouette with aortic atherosclerosis IMPRESSION: No active disease. Moderate to large hiatal hernia. Electronically Signed   By: Luke Bun M.D.   On: 06/26/2024 19:56   CT HEAD WO CONTRAST ( ) Result Date: 06/26/2024 CLINICAL DATA:  Head trauma, minor (Age >= 65y); Neck trauma (Age >= 65y) Fall EXAM: CT HEAD WITHOUT CONTRAST CT CERVICAL SPINE WITHOUT CONTRAST TECHNIQUE: Multidetector CT imaging of the head and cervical spine was performed following the standard protocol without intravenous contrast. Multiplanar CT image reconstructions of the cervical spine were also generated. RADIATION DOSE REDUCTION: This exam was performed according to the departmental dose-optimization program which includes automated exposure control, adjustment of the mA and/or  kV according to patient size and/or use of iterative reconstruction technique. COMPARISON:  CT head and C-spine 04/13/2024 FINDINGS: CT HEAD FINDINGS Brain: No evidence of large-territorial acute infarction. No parenchymal hemorrhage. No mass lesion. No extra-axial collection. No mass effect or midline shift. No hydrocephalus. Basilar cisterns are patent. Vascular: No hyperdense vessel. Atherosclerotic calcifications are present within the cavernous internal carotid and vertebral arteries. Skull: No acute fracture or focal lesion. Sinuses/Orbits: Left sphenoid sinus mucosal  thickening. Left sphenoid wall hypertrophy. Otherwise paranasal sinuses and mastoid air cells are clear. The orbits are unremarkable. Other: None. CT CERVICAL SPINE FINDINGS Alignment: Normal. Skull base and vertebrae: C5-C7 anterior cervical discectomy and fusion. No acute fracture. No aggressive appearing focal osseous lesion or focal pathologic process. Soft tissues and spinal canal: No prevertebral fluid or swelling. No visible canal hematoma. Upper chest: Biapical pleural/pulmonary scarring. Other: Atherosclerotic plaque of the carotid arteries within the neck. IMPRESSION: 1. No acute intracranial abnormality. 2. No acute displaced fracture or traumatic listhesis of the cervical spine. Electronically Signed   By: Morgane  Naveau M.D.   On: 06/26/2024 19:53   CT CERVICAL SPINE WO CONTRAST Result Date: 06/26/2024 CLINICAL DATA:  Head trauma, minor (Age >= 65y); Neck trauma (Age >= 65y) Fall EXAM: CT HEAD WITHOUT CONTRAST CT CERVICAL SPINE WITHOUT CONTRAST TECHNIQUE: Multidetector CT imaging of the head and cervical spine was performed following the standard protocol without intravenous contrast. Multiplanar CT image reconstructions of the cervical spine were also generated. RADIATION DOSE REDUCTION: This exam was performed according to the departmental dose-optimization program which includes automated exposure control, adjustment of the mA and/or kV according to patient size and/or use of iterative reconstruction technique. COMPARISON:  CT head and C-spine 04/13/2024 FINDINGS: CT HEAD FINDINGS Brain: No evidence of large-territorial acute infarction. No parenchymal hemorrhage. No mass lesion. No extra-axial collection. No mass effect or midline shift. No hydrocephalus. Basilar cisterns are patent. Vascular: No hyperdense vessel. Atherosclerotic calcifications are present within the cavernous internal carotid and vertebral arteries. Skull: No acute fracture or focal lesion. Sinuses/Orbits: Left sphenoid sinus  mucosal thickening. Left sphenoid wall hypertrophy. Otherwise paranasal sinuses and mastoid air cells are clear. The orbits are unremarkable. Other: None. CT CERVICAL SPINE FINDINGS Alignment: Normal. Skull base and vertebrae: C5-C7 anterior cervical discectomy and fusion. No acute fracture. No aggressive appearing focal osseous lesion or focal pathologic process. Soft tissues and spinal canal: No prevertebral fluid or swelling. No visible canal hematoma. Upper chest: Biapical pleural/pulmonary scarring. Other: Atherosclerotic plaque of the carotid arteries within the neck. IMPRESSION: 1. No acute intracranial abnormality. 2. No acute displaced fracture or traumatic listhesis of the cervical spine. Electronically Signed   By: Morgane  Naveau M.D.   On: 06/26/2024 19:53        Scheduled Meds:  aspirin  EC  81 mg Oral Daily   buPROPion   150 mg Oral Daily   citalopram   40 mg Oral Daily   irbesartan   75 mg Oral Daily   pantoprazole   40 mg Oral BID AC   pravastatin   40 mg Oral QPM   sodium chloride  flush  3 mL Intravenous Q12H   Continuous Infusions:  sodium chloride  75 mL/hr at 06/27/24 0208   heparin  950 Units/hr (06/27/24 0851)     LOS: 0 days    Time spent: 55 minutes    Lien Lyman D Maree, DO [facility]  If 7PM-7AM, please contact night-coverage [email or web address] 06/27/2024, 11:02 AM

## 2024-06-27 NOTE — Plan of Care (Signed)

## 2024-06-28 ENCOUNTER — Encounter

## 2024-06-28 ENCOUNTER — Ambulatory Visit: Admitting: Nurse Practitioner

## 2024-06-28 ENCOUNTER — Encounter (HOSPITAL_COMMUNITY): Payer: Self-pay | Admitting: Cardiovascular Disease

## 2024-06-28 ENCOUNTER — Encounter: Admitting: Orthopedic Surgery

## 2024-06-28 ENCOUNTER — Observation Stay (HOSPITAL_COMMUNITY)

## 2024-06-28 DIAGNOSIS — R918 Other nonspecific abnormal finding of lung field: Secondary | ICD-10-CM | POA: Diagnosis not present

## 2024-06-28 DIAGNOSIS — I10 Essential (primary) hypertension: Secondary | ICD-10-CM | POA: Diagnosis not present

## 2024-06-28 DIAGNOSIS — J9601 Acute respiratory failure with hypoxia: Secondary | ICD-10-CM | POA: Diagnosis not present

## 2024-06-28 DIAGNOSIS — E785 Hyperlipidemia, unspecified: Secondary | ICD-10-CM | POA: Diagnosis not present

## 2024-06-28 DIAGNOSIS — R55 Syncope and collapse: Secondary | ICD-10-CM | POA: Diagnosis not present

## 2024-06-28 DIAGNOSIS — J90 Pleural effusion, not elsewhere classified: Secondary | ICD-10-CM | POA: Diagnosis not present

## 2024-06-28 DIAGNOSIS — F419 Anxiety disorder, unspecified: Secondary | ICD-10-CM | POA: Diagnosis not present

## 2024-06-28 DIAGNOSIS — I214 Non-ST elevation (NSTEMI) myocardial infarction: Secondary | ICD-10-CM | POA: Diagnosis not present

## 2024-06-28 DIAGNOSIS — I5181 Takotsubo syndrome: Secondary | ICD-10-CM | POA: Diagnosis not present

## 2024-06-28 DIAGNOSIS — E876 Hypokalemia: Secondary | ICD-10-CM | POA: Diagnosis not present

## 2024-06-28 DIAGNOSIS — I700 Atherosclerosis of aorta: Secondary | ICD-10-CM | POA: Diagnosis not present

## 2024-06-28 DIAGNOSIS — J432 Centrilobular emphysema: Secondary | ICD-10-CM | POA: Diagnosis not present

## 2024-06-28 DIAGNOSIS — R7989 Other specified abnormal findings of blood chemistry: Secondary | ICD-10-CM | POA: Diagnosis not present

## 2024-06-28 LAB — CBC
HCT: 41.1 % (ref 36.0–46.0)
Hemoglobin: 13.4 g/dL (ref 12.0–15.0)
MCH: 31.8 pg (ref 26.0–34.0)
MCHC: 32.6 g/dL (ref 30.0–36.0)
MCV: 97.6 fL (ref 80.0–100.0)
Platelets: 215 K/uL (ref 150–400)
RBC: 4.21 MIL/uL (ref 3.87–5.11)
RDW: 13.2 % (ref 11.5–15.5)
WBC: 8.7 K/uL (ref 4.0–10.5)
nRBC: 0 % (ref 0.0–0.2)

## 2024-06-28 LAB — LIPID PANEL
Cholesterol: 142 mg/dL (ref 0–200)
HDL: 64 mg/dL (ref >40)
LDL Cholesterol: 53 mg/dL (ref 0–99)
Total CHOL/HDL Ratio: 2.2 ratio
Triglycerides: 125 mg/dL (ref <150)
VLDL: 25 mg/dL (ref 0–40)

## 2024-06-28 LAB — BASIC METABOLIC PANEL WITH GFR
Anion gap: 7 (ref 5–15)
BUN: 7 mg/dL — ABNORMAL LOW (ref 8–23)
CO2: 25 mmol/L (ref 22–32)
Calcium: 8.6 mg/dL — ABNORMAL LOW (ref 8.9–10.3)
Chloride: 105 mmol/L (ref 98–111)
Creatinine, Ser: 0.95 mg/dL (ref 0.44–1.00)
GFR, Estimated: >60 mL/min (ref >60)
Glucose, Bld: 109 mg/dL — ABNORMAL HIGH (ref 70–99)
Potassium: 3.6 mmol/L (ref 3.5–5.1)
Sodium: 137 mmol/L (ref 135–145)

## 2024-06-28 LAB — MAGNESIUM: Magnesium: 1.6 mg/dL — ABNORMAL LOW (ref 1.7–2.4)

## 2024-06-28 MED ORDER — EZETIMIBE 10 MG PO TABS
10.0000 mg | ORAL_TABLET | Freq: Every day | ORAL | Status: DC
  Administered 2024-06-28 – 2024-06-29 (×2): 10 mg via ORAL
  Filled 2024-06-28 (×2): qty 1

## 2024-06-28 MED ORDER — FUROSEMIDE 10 MG/ML IJ SOLN
40.0000 mg | Freq: Once | INTRAMUSCULAR | Status: AC
  Administered 2024-06-28: 40 mg via INTRAVENOUS
  Filled 2024-06-28: qty 4

## 2024-06-28 MED ORDER — IOHEXOL 350 MG/ML SOLN
65.0000 mL | Freq: Once | INTRAVENOUS | Status: AC | PRN
  Administered 2024-06-28: 65 mL via INTRAVENOUS

## 2024-06-28 MED ORDER — MAGNESIUM SULFATE 2 GM/50ML IV SOLN
2.0000 g | Freq: Once | INTRAVENOUS | Status: AC
  Administered 2024-06-28: 2 g via INTRAVENOUS
  Filled 2024-06-28: qty 50

## 2024-06-28 NOTE — Progress Notes (Addendum)
 Mobility Specialist Progress Note;   06/28/24 1105  Mobility  Activity Ambulated with assistance in hallway  Level of Assistance Contact guard assist, steadying assist  Assistive Device None  Distance Ambulated (ft) 200 ft  Activity Response Tolerated well  Mobility Referral Yes  Mobility visit 1 Mobility  Mobility Specialist Start Time (ACUTE ONLY) 1105  Mobility Specialist Stop Time (ACUTE ONLY) 1115  Mobility Specialist Time Calculation (min) (ACUTE ONLY) 10 min   RN requesting walking O2 test, pt agreeable. On RA upon arrival. Able to ambulate on RA, SPO2 92-95%. Required MinG assistance during ambulation for safety. Pt returned back to bed with all needs met, call bell in reach. RN notified.   Lauraine Erm Mobility Specialist Please contact via SecureChat or [employer] [phone]

## 2024-06-28 NOTE — Care Management Obs Status (Signed)
 MEDICARE OBSERVATION STATUS NOTIFICATION   Patient Details  Name: Jody Wilson MRN: [id number] Date of Birth: Sep 23, 1951   Medicare Observation Status Notification Given:  Yes    Sudie Erminio Deems, RN 06/28/2024, 11:22 AM

## 2024-06-28 NOTE — TOC Transition Note (Signed)
 Transition of Care [facility]) - Discharge Note   Patient Details  Name: Jody Wilson MRN: [id number] Date of Birth: [birth year]  Transition of Care [facility] CM/SW Contact:  Sudie Erminio Deems, RN Phone Number: 06/28/2024, 11:24 AM   Clinical Narrative: Patient was a transfer from [facility] for cath. PTA patient states she was from home with support of spouse. Patient has DME cane and rolling walker. Oxygen has been removed and will not need oxygen for home. No home needs identified at this time.   Final next level of care: Home/Self Care Barriers to Discharge: No Barriers Identified   Patient Goals and CMS Choice Patient states their goals for this hospitalization and ongoing recovery are:: Plans to return home.   Choice offered to / list presented to : NA   Discharge Plan and Services Additional resources added to the After Visit Summary for   In-house Referral: NA Discharge Planning Services: NA Post Acute Care Choice: NA            DME Agency: NA       HH Arranged: NA    Social Drivers of Health (SDOH) Interventions SDOH Screenings   Food Insecurity: No Food Insecurity (06/27/2024)  Housing: Low Risk  (06/27/2024)  Transportation Needs: No Transportation Needs (06/27/2024)  Utilities: Not At Risk (06/27/2024)  Alcohol Screen: Low Risk  (12/27/2023)  Depression (PHQ2-9): Medium Risk (05/23/2024)  Financial Resource Strain: Low Risk  (12/27/2023)  Physical Activity: Inactive (12/27/2023)  Social Connections: Socially Integrated (06/27/2024)  Stress: No Stress Concern Present (12/27/2023)  Tobacco Use: Medium Risk (06/27/2024)  Health Literacy: Adequate Health Literacy (12/27/2023)     Readmission Risk Interventions    04/13/2024    9:01 AM  Readmission Risk Prevention Plan  Medication Screening Complete  Transportation Screening Complete

## 2024-06-28 NOTE — Plan of Care (Signed)
  Problem: Clinical Measurements: Goal: Ability to maintain clinical measurements within normal limits will improve Outcome: Progressing Goal: Will remain free from infection Outcome: Progressing Goal: Diagnostic test results will improve Outcome: Progressing Goal: Respiratory complications will improve Outcome: Progressing Goal: Cardiovascular complication will be avoided Outcome: Progressing   Problem: Activity: Goal: Risk for activity intolerance will decrease Outcome: Progressing   Problem: Pain Managment: Goal: General experience of comfort will improve and/or be controlled Outcome: Progressing

## 2024-06-28 NOTE — Progress Notes (Signed)
 Nurse requested Mobility Specialist to perform oxygen saturation test with pt which includes removing pt from oxygen both at rest and while ambulating.  Below are the results from that testing.     Patient Saturations on Room Air at Rest = spO2 96%  Patient Saturations on Room Air while Ambulating = sp02 92-95% .    Patient Saturations on 0 Liters of oxygen while Ambulating = sp02 92%  At end of testing pt left in room on 0  Liters of oxygen.  Reported results to nurse.    Lauraine Erm Mobility Specialist Please contact via SecureChat or [employer] [phone]

## 2024-06-28 NOTE — Progress Notes (Signed)
 PROGRESS NOTE    Jody Wilson  FMW:[id number] DOB: [birth year] DOA: 06/26/2024 PCP: Jody Wilson LABOR, MD   Brief Narrative:  Jody Wilson is a 73 y.o. female with medical history significant for hypertension, hyperlipidemia, depression, and anxiety who presents after a syncopal episode with fall.  In the ED she was noted to have significant elevation in troponin levels and cardiology is recommending transfer to [facility] for further evaluation with initiation of IV heparin  drip.   2D echocardiogram with LVEF 45% and new wall motion abnormalities.  Plans are for further cardiac workup including imaging and possible catheterization per their schedule.  Assessment & Plan:   Principal Problem:   Syncope Active Problems:   Hypertension   Hyperlipidemia with target LDL less than 100   Anxiety and depression   Hypokalemia   Elevated troponin   Acute respiratory failure with hypoxia (HCC)  Syncope likely in the setting of ACS - 2D echocardiogram with LVEF 45% and new wall motion abnormalities noted - Concern for ACS with plans for cardiac catheterization per cardiology schedule - Echo consistent with Takotsubo cardiomyopathy   Elevated troponin rule out ACS - Appreciate cardiology evaluation with plans to transfer to [facility] for catheterization this afternoon   Acute hypoxic respiratory failure  - Requiring 2 Lpm supplemental O2 in ED  - CXR clear and exam unremarkable; age-adjusted d-dimer is negative  - Start incentive spirometry, currently on room air at rest - CTA chest pending - Echo consistent with Takotsubo cardiomyopathy   Hypokalemia  - Replacing  -Follow-up in a.m.   Hypertension  - Continue ARB    Hyperlipidemia  - Continue statin    Depression, anxiety  - Continue Wellbutrin , Celexa , and as-needed Xanax    - Patient Xanax  recently ran out, concern for prior withdrawals - Numerous stressors concurrently including multiple sick family members - Echo  consistent with Takotsubo   DVT prophylaxis: Heparin  drip Code Status:   Code Status: Full Code Family Communication: None present  Status is: Inpatient  Dispo: The patient is from: Home              Anticipated d/c is to: Home              Anticipated d/c date is: 24 to 48 hours              Patient currently not medically stable for discharge  Consultants:  Cardiology  Procedures:  Cardiac catheterization  Antimicrobials:  Perioperatively  Subjective: No acute issues or events overnight denies nausea vomiting diarrhea constipation headache fevers chills or chest pain  Objective: Vitals:   06/28/24 0000 06/28/24 0008 06/28/24 0200 06/28/24 0343  BP:  137/73  (!) 156/89  Pulse: 70 79 76 99  Resp:  18  20  Temp:  98.5 F (36.9 C)  98.3 F (36.8 C)  TempSrc:  Oral  Oral  SpO2: 90% 91% 92% 90%  Weight:      Height:        Intake/Output Summary (Last 24 hours) at 06/28/2024 0710 Last data filed at 06/27/2024 2126 Gross per 24 hour  Intake 1690.56 ml  Output 200 ml  Net 1490.56 ml   Filed Weights   06/26/24 1913 06/27/24 1710  Weight: 64 kg 64.7 kg    Examination:  General exam: Appears calm and comfortable  Respiratory system: Clear to auscultation. Respiratory effort normal. Cardiovascular system: S1 & S2 heard, RRR. No JVD, murmurs, rubs, gallops or clicks. No pedal edema. Gastrointestinal  system: Abdomen is nondistended, soft and nontender. No organomegaly or masses felt. Normal bowel sounds heard. Central nervous system: Alert and oriented. No focal neurological deficits. Extremities: Symmetric 5 x 5 power. Skin: No rashes, lesions or ulcers Psychiatry: Judgement and insight appear normal. Mood & affect appropriate.     Data Reviewed: I have personally reviewed following labs and imaging studies  CBC: Recent Labs  Lab 06/26/24 2009 06/27/24 0617 06/28/24 0434  WBC 9.2 6.1 8.7  NEUTROABS 7.3  --   --   HGB 14.0 12.6 13.4  HCT 41.2 39.8 41.1   MCV 97.6 100.8* 97.6  PLT 254 194 215   Basic Metabolic Panel: Recent Labs  Lab 06/26/24 2009 06/27/24 0148 06/27/24 0617 06/28/24 0434  NA 139  --  138 137  K 3.2*  --  3.1* 3.6  CL 103  --  105 105  CO2 25  --  23 25  GLUCOSE 112*  --  103* 109*  BUN 9  --  8 7*  CREATININE 0.96  --  0.70 0.95  CALCIUM  9.3  --  8.5* 8.6*  MG  --  1.8  --  1.6*   GFR: Estimated Creatinine Clearance: 48.2 mL/min (by C-G formula based on SCr of 0.95 mg/dL). Liver Function Tests: Recent Labs  Lab 06/26/24 2009  AST 19  ALT 11  ALKPHOS 71  BILITOT 1.3*  PROT 7.4  ALBUMIN 4.0   No results for input(s): LIPASE, AMYLASE in the last 168 hours. No results for input(s): AMMONIA in the last 168 hours. Coagulation Profile: No results for input(s): INR, PROTIME in the last 168 hours. Cardiac Enzymes: No results for input(s): CKTOTAL, CKMB, CKMBINDEX, TROPONINI in the last 168 hours. BNP (last 3 results) No results for input(s): PROBNP in the last 8760 hours. HbA1C: No results for input(s): HGBA1C in the last 72 hours. CBG: No results for input(s): GLUCAP in the last 168 hours. Lipid Profile: Recent Labs    06/28/24 0434  CHOL 142  HDL 64  LDLCALC 53  TRIG 125  CHOLHDL 2.2   Thyroid  Function Tests: No results for input(s): TSH, T4TOTAL, FREET4, T3FREE, THYROIDAB in the last 72 hours. Anemia Panel: No results for input(s): VITAMINB12, FOLATE, FERRITIN, TIBC, IRON, RETICCTPCT in the last 72 hours. Sepsis Labs: No results for input(s): PROCALCITON, LATICACIDVEN in the last 168 hours.  No results found for this or any previous visit (from the past 240 hours).       Radiology Studies: CARDIAC CATHETERIZATION Result Date: 06/27/2024   2nd Diag lesion is 40% stenosed. Mild plaque in the ostium of the diagonal branch but no obstructive disease noted in the LAD, Circumflex or RCA. LVEDP=21 mmhg Recommendations: Her LV dysfunction is  felt to be due to stress induced cardiomyopathy. Medical management of mild CAD   ECHOCARDIOGRAM COMPLETE Result Date: 06/27/2024    ECHOCARDIOGRAM REPORT   Patient Name:   Jody Wilson Date of Exam: 06/27/2024 Medical Rec #:  [id number]       Height:       65.0 in Accession #:    [id number]      Weight:       141.1 lb Date of Birth:  [birth year]      BSA:          1.706 m Patient Age:    72 years        BP:           146/86 mmHg Patient Gender: F  HR:           80 bpm. Exam Location:  [name] Procedure: 2D Echo, 3D Echo, Cardiac Doppler, Color Doppler and Strain Analysis            (Both Spectral and Color Flow Doppler were utilized during            procedure). Indications:    Syncope R55 / Elevated Troponin  History:        Patient has prior history of Echocardiogram examinations, most                 recent 03/05/2024. Signs/Symptoms:Syncope; Risk                 Factors:Hypertension, Dyslipidemia and Tobacco abuse.  Sonographer:    Aida Pizza RCS Referring Phys: [id number] TIMOTHY S OPYD  Sonographer Comments: Global longitudinal strain was attempted. IMPRESSIONS  1. Left ventricular ejection fraction, by estimation, is 45 to 50%. Left ventricular ejection fraction by 3D volume is 51 %. The left ventricle has mildly decreased function. 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 Left ventricular diastolic parameters are consistent with Grade I diastolic dysfunction (impaired relaxation). The average left ventricular global longitudinal strain is -13.4  %. The global longitudinal strain is abnormal.  2. Right ventricular systolic function is normal. The right ventricular size is normal. There is mildly elevated pulmonary artery systolic pressure. The estimated right ventricular systolic pressure is 37.6 mmHg.  3. The mitral valve is normal in structure. No evidence of mitral valve  regurgitation. No evidence of mitral stenosis.  4. The aortic valve has an indeterminant number of cusps. Aortic valve regurgitation is not visualized. No aortic stenosis is present.  5. The inferior vena cava is normal in size with greater than 50% respiratory variability, suggesting right atrial pressure of 3 mmHg. Comparison(s): Prior images reviewed side by side. Changes from prior study are noted. LVEF mildly reduced to 45% now with new RWMA. FINDINGS  Left Ventricle: Left ventricular ejection fraction, by estimation, is 45 to 50%. Left ventricular ejection fraction by 3D volume is 51 %. The left ventricle has mildly decreased function. The left ventricle demonstrates regional wall motion abnormalities. The average left ventricular global longitudinal strain is -13.4 %. Strain was performed and the global longitudinal strain is abnormal. The left ventricular internal cavity size was normal in size. There is no left ventricular hypertrophy. Left ventricular diastolic parameters are consistent with Grade I diastolic dysfunction (impaired relaxation). Normal left ventricular filling pressure.  LV Wall Scoring: The apical lateral segment, mid anterolateral segment, mid inferoseptal segment, apical septal segment, and apex are akinetic. The mid and distal anterior wall, anterior septum, and apical inferior segment are hypokinetic. The inferior wall, posterior wall, basal anterolateral segment, basal anterior segment, and basal inferoseptal segment are normal. Right Ventricle: The right ventricular size is normal. No increase in right ventricular wall thickness. Right ventricular systolic function is normal. There is mildly elevated pulmonary artery systolic pressure. The tricuspid regurgitant velocity is 2.94  m/s, and with an assumed right atrial pressure of 3 mmHg, the estimated right ventricular systolic pressure is 37.6 mmHg. Left Atrium: Left atrial size was normal in size. Right Atrium: Right atrial size was  normal in size. Pericardium: There is no evidence of pericardial effusion. Mitral Valve: The mitral valve is normal in structure. No evidence of mitral valve regurgitation. No evidence of mitral valve stenosis. Tricuspid Valve:  The tricuspid valve is normal in structure. Tricuspid valve regurgitation is mild . No evidence of tricuspid stenosis. Aortic Valve: The aortic valve has an indeterminant number of cusps. Aortic valve regurgitation is not visualized. No aortic stenosis is present. Pulmonic Valve: The pulmonic valve was not well visualized. Pulmonic valve regurgitation is not visualized. No evidence of pulmonic stenosis. Aorta: The aortic root is normal in size and structure. Venous: The inferior vena cava is normal in size with greater than 50% respiratory variability, suggesting right atrial pressure of 3 mmHg. IAS/Shunts: No atrial level shunt detected by color flow Doppler. Additional Comments: 3D was performed not requiring image post processing on an independent workstation and was abnormal.  LEFT VENTRICLE PLAX 2D LVIDd:         4.50 cm         Diastology LVIDs:         2.90 cm         LV e' medial:    6.31 cm/s LV PW:         1.00 cm         LV E/e' medial:  14.4 LV IVS:        0.90 cm         LV e' lateral:   6.42 cm/s LVOT diam:     1.90 cm         LV E/e' lateral: 14.2 LV SV:         49 LV SV Index:   29              2D Longitudinal LVOT Area:     2.84 cm        Strain                                2D Strain GLS   -13.4 %                                Avg:                                 3D Volume EF                                LV 3D EF:    Left                                             ventricul                                             ar                                             ejection  fraction                                             by 3D                                             volume is                                              51 %.                                 3D Volume EF:                                3D EF:        51 %                                LV EDV:       109 ml                                LV ESV:       53 ml                                LV SV:        56 ml RIGHT VENTRICLE RV S prime:     15.00 cm/s TAPSE (M-mode): 2.2 cm LEFT ATRIUM             Index        RIGHT ATRIUM           Index LA diam:        2.70 cm 1.58 cm/m   RA Area:     13.60 cm LA Vol (A2C):   37.1 ml 21.75 ml/m  RA Volume:   32.20 ml  18.88 ml/m LA Vol (A4C):   45.4 ml 26.62 ml/m LA Biplane Vol: 41.6 ml 24.39 ml/m  AORTIC VALVE LVOT Vmax:   79.30 cm/s LVOT Vmean:  53.300 cm/s LVOT VTI:    0.172 m  AORTA Ao Root diam: 2.90 cm MITRAL VALVE                TRICUSPID VALVE MV Area (PHT): 5.13 cm     TR Peak grad:   34.6 mmHg MV Decel Time: 148 msec     TR Vmax:        294.00 cm/s MV E velocity: 91.10 cm/s MV A velocity: 114.00 cm/s  SHUNTS MV E/A ratio:  0.80         Systemic VTI:  0.17 m                             Systemic Diam: 1.90 cm Vishnu Priya Mallipeddi Electronically signed by  Vishnu Priya Mallipeddi Signature Date/Time: 06/27/2024/10:44:04 AM    Final    DG Chest Portable 1 View Result Date: 06/26/2024 CLINICAL DATA:  Fall hypoxia EXAM: PORTABLE CHEST 1 VIEW COMPARISON:  04/13/2024 FINDINGS: Hardware in the cervical spine. Moderate to large hiatal hernia. No acute airspace disease, pleural effusion or pneumothorax. Stable cardiomediastinal silhouette with aortic atherosclerosis IMPRESSION: No active disease. Moderate to large hiatal hernia. Electronically Signed   By: Luke Bun M.D.   On: 06/26/2024 19:56   CT HEAD WO CONTRAST ( ) Result Date: 06/26/2024 CLINICAL DATA:  Head trauma, minor (Age >= 65y); Neck trauma (Age >= 65y) Fall EXAM: CT HEAD WITHOUT CONTRAST CT CERVICAL SPINE WITHOUT CONTRAST TECHNIQUE: Multidetector CT imaging of the head and cervical spine was performed following the standard protocol without intravenous  contrast. Multiplanar CT image reconstructions of the cervical spine were also generated. RADIATION DOSE REDUCTION: This exam was performed according to the departmental dose-optimization program which includes automated exposure control, adjustment of the mA and/or kV according to patient size and/or use of iterative reconstruction technique. COMPARISON:  CT head and C-spine 04/13/2024 FINDINGS: CT HEAD FINDINGS Brain: No evidence of large-territorial acute infarction. No parenchymal hemorrhage. No mass lesion. No extra-axial collection. No mass effect or midline shift. No hydrocephalus. Basilar cisterns are patent. Vascular: No hyperdense vessel. Atherosclerotic calcifications are present within the cavernous internal carotid and vertebral arteries. Skull: No acute fracture or focal lesion. Sinuses/Orbits: Left sphenoid sinus mucosal thickening. Left sphenoid wall hypertrophy. Otherwise paranasal sinuses and mastoid air cells are clear. The orbits are unremarkable. Other: None. CT CERVICAL SPINE FINDINGS Alignment: Normal. Skull base and vertebrae: C5-C7 anterior cervical discectomy and fusion. No acute fracture. No aggressive appearing focal osseous lesion or focal pathologic process. Soft tissues and spinal canal: No prevertebral fluid or swelling. No visible canal hematoma. Upper chest: Biapical pleural/pulmonary scarring. Other: Atherosclerotic plaque of the carotid arteries within the neck. IMPRESSION: 1. No acute intracranial abnormality. 2. No acute displaced fracture or traumatic listhesis of the cervical spine. Electronically Signed   By: Morgane  Naveau M.D.   On: 06/26/2024 19:53   CT CERVICAL SPINE WO CONTRAST Result Date: 06/26/2024 CLINICAL DATA:  Head trauma, minor (Age >= 65y); Neck trauma (Age >= 65y) Fall EXAM: CT HEAD WITHOUT CONTRAST CT CERVICAL SPINE WITHOUT CONTRAST TECHNIQUE: Multidetector CT imaging of the head and cervical spine was performed following the standard protocol without  intravenous contrast. Multiplanar CT image reconstructions of the cervical spine were also generated. RADIATION DOSE REDUCTION: This exam was performed according to the departmental dose-optimization program which includes automated exposure control, adjustment of the mA and/or kV according to patient size and/or use of iterative reconstruction technique. COMPARISON:  CT head and C-spine 04/13/2024 FINDINGS: CT HEAD FINDINGS Brain: No evidence of large-territorial acute infarction. No parenchymal hemorrhage. No mass lesion. No extra-axial collection. No mass effect or midline shift. No hydrocephalus. Basilar cisterns are patent. Vascular: No hyperdense vessel. Atherosclerotic calcifications are present within the cavernous internal carotid and vertebral arteries. Skull: No acute fracture or focal lesion. Sinuses/Orbits: Left sphenoid sinus mucosal thickening. Left sphenoid wall hypertrophy. Otherwise paranasal sinuses and mastoid air cells are clear. The orbits are unremarkable. Other: None. CT CERVICAL SPINE FINDINGS Alignment: Normal. Skull base and vertebrae: C5-C7 anterior cervical discectomy and fusion. No acute fracture. No aggressive appearing focal osseous lesion or focal pathologic process. Soft tissues and spinal canal: No prevertebral fluid or swelling. No visible canal hematoma. Upper chest: Biapical pleural/pulmonary scarring. Other: Atherosclerotic plaque of the  carotid arteries within the neck. IMPRESSION: 1. No acute intracranial abnormality. 2. No acute displaced fracture or traumatic listhesis of the cervical spine. Electronically Signed   By: Morgane  Naveau M.D.   On: 06/26/2024 19:53   Scheduled Meds:  aspirin  EC  81 mg Oral Daily   buPROPion   150 mg Oral Daily   carvedilol   3.125 mg Oral BID WC   citalopram   40 mg Oral Daily   irbesartan   75 mg Oral Daily   pantoprazole   40 mg Oral BID AC   pravastatin   40 mg Oral QPM   sodium chloride  flush  3 mL Intravenous Q12H   sodium chloride   flush  3 mL Intravenous Q12H   Continuous Infusions:  sodium chloride        LOS: 0 days   Time spent:  Elsie JAYSON [place], DO [facility]  If 7PM-7AM, please contact night-coverage [email or web address]  06/28/2024, 7:10 AM

## 2024-06-28 NOTE — Plan of Care (Signed)
  Problem: Education: Goal: Knowledge of General Education information will improve Description: Including pain rating scale, medication(s)/side effects and non-pharmacologic comfort measures Outcome: Progressing   Problem: Health Behavior/Discharge Planning: Goal: Ability to manage health-related needs will improve Outcome: Progressing   Problem: Clinical Measurements: Goal: Ability to maintain clinical measurements within normal limits will improve Outcome: Progressing Goal: Diagnostic test results will improve Outcome: Progressing Goal: Respiratory complications will improve Outcome: Progressing Goal: Cardiovascular complication will be avoided Outcome: Progressing   Problem: Nutrition: Goal: Adequate nutrition will be maintained Outcome: Progressing   Problem: Pain Managment: Goal: General experience of comfort will improve and/or be controlled Outcome: Progressing   Problem: Safety: Goal: Ability to remain free from injury will improve Outcome: Progressing

## 2024-06-28 NOTE — Progress Notes (Addendum)
 Rounding Note   Patient Name: Jody Wilson Date of Encounter: 06/28/2024  [facility] Cardiologist: Madonna Large, DO   Subjective Patient is doing okay today.  She denies lightheadedness, chest pain, and shortness of breath. She denies any recent sick contacts.  Denies fever, chills, or cough.  When taken off the oxygen during interview patient felt fine (oxygen saturation the low 90s) She has been requiring oxygen with ambulating, during those times she does feel a little jittery.  She does not use oxygen at home.  She is due for CT lung screening.   She does report stress in her personal life.  Her husband is currently hospitalized at [facility] for the past several days.  She just recently lost a sister-in-law.  And her brother-in-law is currently awaiting renal transplant with other ongoing medical conditions.  Several days leading up to her syncopal episode she reports not taking care of herself as much given her husband was in the hospital.  She had not been adequately eating and hydrating.  Prior to her syncopal episode she was lightheaded and dizzy, has not had the symptoms since being admitted.   Scheduled Meds:  aspirin  EC  81 mg Oral Daily   buPROPion   150 mg Oral Daily   carvedilol   3.125 mg Oral BID WC   citalopram   40 mg Oral Daily   ezetimibe   10 mg Oral Daily   irbesartan   75 mg Oral Daily   pantoprazole   40 mg Oral BID AC   pravastatin   40 mg Oral QPM   sodium chloride  flush  3 mL Intravenous Q12H   sodium chloride  flush  3 mL Intravenous Q12H   Continuous Infusions:  sodium chloride      PRN Meds: sodium chloride , acetaminophen  **OR** acetaminophen , ALPRAZolam , ondansetron  **OR** ondansetron  (ZOFRAN ) IV, oxyCODONE , polyethylene glycol, sodium chloride  flush   Vital Signs  Vitals:   06/28/24 0343 06/28/24 0730 06/28/24 0801 06/28/24 0813  BP: (!) 156/89  (!) 149/97 (!) 147/97  Pulse: 99 85  83  Resp: 20  16   Temp: 98.3 F (36.8 C)  99.1 F  (37.3 C)   TempSrc: Oral  Oral   SpO2: 90% 91% 94%   Weight:      Height:        Intake/Output Summary (Last 24 hours) at 06/28/2024 1131 Last data filed at 06/28/2024 0841 Gross per 24 hour  Intake 1602.4 ml  Output 200 ml  Net 1402.4 ml      06/27/2024    5:10 PM 06/26/2024    7:13 PM 05/23/2024    9:04 AM  Last 3 Weights  Weight (lbs) 142 lb 10.2 oz 141 lb 1.5 oz 140 lb  Weight (kg) 64.7 kg 64 kg 63.504 kg      Telemetry Sinus rhythm HR ~75-80- Personally Reviewed  ECG  Sinus rhythm VR 78 - Personally Reviewed  Physical Exam GEN: No acute distress.   Cardiac: RRR, no murmurs, rubs, or gallops.  Respiratory: subtle rhonchi to right base, no crackles or wheezing. GI: Soft, nontender, non-distended  MS: No edema; No deformity. R cath site without tenderness or ecchymosis.  Dressing clean and dry.  Neuro:  Nonfocal  Psych: Normal affect   Labs High Sensitivity Troponin:   Recent Labs  Lab 06/26/24 2009 06/26/24 2211 06/27/24 0148 06/27/24 0617  TROPONINIHS 588* 1,116* 1,221* 865*     Chemistry Recent Labs  Lab 06/26/24 2009 06/27/24 0148 06/27/24 0617 06/28/24 0434  NA 139  --  138 137  K 3.2*  --  3.1* 3.6  CL 103  --  105 105  CO2 25  --  23 25  GLUCOSE 112*  --  103* 109*  BUN 9  --  8 7*  CREATININE 0.96  --  0.70 0.95  CALCIUM  9.3  --  8.5* 8.6*  MG  --  1.8  --  1.6*  PROT 7.4  --   --   --   ALBUMIN 4.0  --   --   --   AST 19  --   --   --   ALT 11  --   --   --   ALKPHOS 71  --   --   --   BILITOT 1.3*  --   --   --   GFRNONAA >60  --  >60 >60  ANIONGAP 11  --  10 7    Lipids  Recent Labs  Lab 06/28/24 0434  CHOL 142  TRIG 125  HDL 64  LDLCALC 53  CHOLHDL 2.2    Hematology Recent Labs  Lab 06/26/24 2009 06/27/24 0617 06/28/24 0434  WBC 9.2 6.1 8.7  RBC 4.22 3.95 4.21  HGB 14.0 12.6 13.4  HCT 41.2 39.8 41.1  MCV 97.6 100.8* 97.6  MCH 33.2 31.9 31.8  MCHC 34.0 31.7 32.6  RDW 13.4 13.4 13.2  PLT 254 194 215   Thyroid  No  results for input(s): TSH, FREET4 in the last 168 hours.  BNP Recent Labs  Lab 06/26/24 2009  BNP 56.0    DDimer  Recent Labs  Lab 06/26/24 2009  DDIMER 0.55*     Radiology  CARDIAC CATHETERIZATION Result Date: 06/27/2024   2nd Diag lesion is 40% stenosed. Mild plaque in the ostium of the diagonal branch but no obstructive disease noted in the LAD, Circumflex or RCA. 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 Medical management of mild CAD   ECHOCARDIOGRAM COMPLETE Result Date: 06/27/2024    ECHOCARDIOGRAM REPORT   Patient Name:   Jody Wilson Date of Exam: 06/27/2024 Medical Rec #:  [id number]       Height:       65.0 in Accession #:    [id number]      Weight:       141.1 lb Date of Birth:  09-13-1951      BSA:          1.706 m Patient Age:    73 years        BP:           146/86 mmHg Patient Gender: F               HR:           80 bpm. Exam Location:  [name] Procedure: 2D Echo, 3D Echo, Cardiac Doppler, Color Doppler and Strain Analysis            (Both Spectral and Color Flow Doppler were utilized during            procedure). Indications:    Syncope R55 / Elevated Troponin  History:        Patient has prior history of Echocardiogram examinations, most                 recent 03/05/2024. Signs/Symptoms:Syncope; Risk                 Factors:Hypertension, Dyslipidemia and Tobacco abuse.  Sonographer:    Aida Pizza RCS Referring Phys: [id number] TIMOTHY S OPYD  Sonographer Comments: Global longitudinal strain was attempted. IMPRESSIONS  1. Left ventricular ejection fraction, by estimation, is 45 to 50%. Left ventricular ejection fraction by 3D volume is 51 %. The left ventricle has mildly decreased function. 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 Left ventricular diastolic parameters are consistent with Grade I diastolic  dysfunction (impaired relaxation). The average left ventricular global longitudinal strain is -13.4  %. The global longitudinal strain is abnormal.  2. Right ventricular systolic function is normal. The right ventricular size is normal. There is mildly elevated pulmonary artery systolic pressure. The estimated right ventricular systolic pressure is 37.6 mmHg.  3. The mitral valve is normal in structure. No evidence of mitral valve regurgitation. No evidence of mitral stenosis.  4. The aortic valve has an indeterminant number of cusps. Aortic valve regurgitation is not visualized. No aortic stenosis is present.  5. The inferior vena cava is normal in size with greater than 50% respiratory variability, suggesting right atrial pressure of 3 mmHg. Comparison(s): Prior images reviewed side by side. Changes from prior study are noted. LVEF mildly reduced to 45% now with new RWMA. FINDINGS  Left Ventricle: Left ventricular ejection fraction, by estimation, is 45 to 50%. Left ventricular ejection fraction by 3D volume is 51 %. The left ventricle has mildly decreased function. The left ventricle demonstrates regional wall motion abnormalities. The average left ventricular global longitudinal strain is -13.4 %. Strain was performed and the global longitudinal strain is abnormal. The left ventricular internal cavity size was normal in size. There is no left ventricular hypertrophy. Left ventricular diastolic parameters are consistent with Grade I diastolic dysfunction (impaired relaxation). Normal left ventricular filling pressure.  LV Wall Scoring: The apical lateral segment, mid anterolateral segment, mid inferoseptal segment, apical septal segment, and apex are akinetic. The mid and distal anterior wall, anterior septum, and apical inferior segment are hypokinetic. The inferior wall, posterior wall, basal anterolateral segment, basal anterior segment, and basal inferoseptal segment are normal. Right Ventricle: The right  ventricular size is normal. No increase in right ventricular wall thickness. Right ventricular systolic function is normal. There is mildly elevated pulmonary artery systolic pressure. The tricuspid regurgitant velocity is 2.94  m/s, and with an assumed right atrial pressure of 3 mmHg, the estimated right ventricular systolic pressure is 37.6 mmHg. Left Atrium: Left atrial size was normal in size. Right Atrium: Right atrial size was normal in size. Pericardium: There is no evidence of pericardial effusion. Mitral Valve: The mitral valve is normal in structure. No evidence of mitral valve regurgitation. No evidence of mitral valve stenosis. Tricuspid Valve: The tricuspid valve is normal in structure. Tricuspid valve regurgitation is mild . No evidence of tricuspid stenosis. Aortic Valve: The aortic valve has an indeterminant number of cusps. Aortic valve regurgitation is not visualized. No aortic stenosis is present. Pulmonic Valve: The pulmonic valve was not well visualized. Pulmonic valve regurgitation is not visualized. No evidence of pulmonic stenosis. Aorta: The aortic root is normal in size and structure. Venous: The inferior vena cava is normal in size with greater than 50% respiratory variability, suggesting right atrial pressure of 3 mmHg. IAS/Shunts: No atrial level shunt detected by color flow Doppler. Additional Comments: 3D was performed not requiring image post processing on an independent workstation and was abnormal.  LEFT VENTRICLE PLAX 2D LVIDd:  4.50 cm         Diastology LVIDs:         2.90 cm         LV e' medial:    6.31 cm/s LV PW:         1.00 cm         LV E/e' medial:  14.4 LV IVS:        0.90 cm         LV e' lateral:   6.42 cm/s LVOT diam:     1.90 cm         LV E/e' lateral: 14.2 LV SV:         49 LV SV Index:   29              2D Longitudinal LVOT Area:     2.84 cm        Strain                                2D Strain GLS   -13.4 %                                Avg:                                  3D Volume EF                                LV 3D EF:    Left                                             ventricul                                             ar                                             ejection                                             fraction                                             by 3D                                             volume is  51 %.                                 3D Volume EF:                                3D EF:        51 %                                LV EDV:       109 ml                                LV ESV:       53 ml                                LV SV:        56 ml RIGHT VENTRICLE RV S prime:     15.00 cm/s TAPSE (M-mode): 2.2 cm LEFT ATRIUM             Index        RIGHT ATRIUM           Index LA diam:        2.70 cm 1.58 cm/m   RA Area:     13.60 cm LA Vol (A2C):   37.1 ml 21.75 ml/m  RA Volume:   32.20 ml  18.88 ml/m LA Vol (A4C):   45.4 ml 26.62 ml/m LA Biplane Vol: 41.6 ml 24.39 ml/m  AORTIC VALVE LVOT Vmax:   79.30 cm/s LVOT Vmean:  53.300 cm/s LVOT VTI:    0.172 m  AORTA Ao Root diam: 2.90 cm MITRAL VALVE                TRICUSPID VALVE MV Area (PHT): 5.13 cm     TR Peak grad:   34.6 mmHg MV Decel Time: 148 msec     TR Vmax:        294.00 cm/s MV E velocity: 91.10 cm/s MV A velocity: 114.00 cm/s  SHUNTS MV E/A ratio:  0.80         Systemic VTI:  0.17 m                             Systemic Diam: 1.90 cm Vishnu Priya Mallipeddi Electronically signed by Diannah Late Mallipeddi Signature Date/Time: 06/27/2024/10:44:04 AM    Final    DG Chest Portable 1 View Result Date: 06/26/2024 CLINICAL DATA:  Fall hypoxia EXAM: PORTABLE CHEST 1 VIEW COMPARISON:  04/13/2024 FINDINGS: Hardware in the cervical spine. Moderate to large hiatal hernia. No acute airspace disease, pleural effusion or pneumothorax. Stable cardiomediastinal silhouette with aortic atherosclerosis IMPRESSION: No active disease.  Moderate to large hiatal hernia. Electronically Signed   By: Luke Bun M.D.   On: 06/26/2024 19:56   CT HEAD WO CONTRAST ( ) Result Date: 06/26/2024 CLINICAL DATA:  Head trauma, minor (Age >= 65y); Neck trauma (Age >= 65y) Fall EXAM: CT HEAD WITHOUT CONTRAST CT CERVICAL SPINE WITHOUT CONTRAST TECHNIQUE: Multidetector CT imaging of the head and cervical spine was performed following the standard protocol without intravenous contrast.  Multiplanar CT image reconstructions of the cervical spine were also generated. RADIATION DOSE REDUCTION: This exam was performed according to the departmental dose-optimization program which includes automated exposure control, adjustment of the mA and/or kV according to patient size and/or use of iterative reconstruction technique. COMPARISON:  CT head and C-spine 04/13/2024 FINDINGS: CT HEAD FINDINGS Brain: No evidence of large-territorial acute infarction. No parenchymal hemorrhage. No mass lesion. No extra-axial collection. No mass effect or midline shift. No hydrocephalus. Basilar cisterns are patent. Vascular: No hyperdense vessel. Atherosclerotic calcifications are present within the cavernous internal carotid and vertebral arteries. Skull: No acute fracture or focal lesion. Sinuses/Orbits: Left sphenoid sinus mucosal thickening. Left sphenoid wall hypertrophy. Otherwise paranasal sinuses and mastoid air cells are clear. The orbits are unremarkable. Other: None. CT CERVICAL SPINE FINDINGS Alignment: Normal. Skull base and vertebrae: C5-C7 anterior cervical discectomy and fusion. No acute fracture. No aggressive appearing focal osseous lesion or focal pathologic process. Soft tissues and spinal canal: No prevertebral fluid or swelling. No visible canal hematoma. Upper chest: Biapical pleural/pulmonary scarring. Other: Atherosclerotic plaque of the carotid arteries within the neck. IMPRESSION: 1. No acute intracranial abnormality. 2. No acute displaced fracture or traumatic  listhesis of the cervical spine. Electronically Signed   By: Morgane  Naveau M.D.   On: 06/26/2024 19:53   CT CERVICAL SPINE WO CONTRAST Result Date: 06/26/2024 CLINICAL DATA:  Head trauma, minor (Age >= 65y); Neck trauma (Age >= 65y) Fall EXAM: CT HEAD WITHOUT CONTRAST CT CERVICAL SPINE WITHOUT CONTRAST TECHNIQUE: Multidetector CT imaging of the head and cervical spine was performed following the standard protocol without intravenous contrast. Multiplanar CT image reconstructions of the cervical spine were also generated. RADIATION DOSE REDUCTION: This exam was performed according to the departmental dose-optimization program which includes automated exposure control, adjustment of the mA and/or kV according to patient size and/or use of iterative reconstruction technique. COMPARISON:  CT head and C-spine 04/13/2024 FINDINGS: CT HEAD FINDINGS Brain: No evidence of large-territorial acute infarction. No parenchymal hemorrhage. No mass lesion. No extra-axial collection. No mass effect or midline shift. No hydrocephalus. Basilar cisterns are patent. Vascular: No hyperdense vessel. Atherosclerotic calcifications are present within the cavernous internal carotid and vertebral arteries. Skull: No acute fracture or focal lesion. Sinuses/Orbits: Left sphenoid sinus mucosal thickening. Left sphenoid wall hypertrophy. Otherwise paranasal sinuses and mastoid air cells are clear. The orbits are unremarkable. Other: None. CT CERVICAL SPINE FINDINGS Alignment: Normal. Skull base and vertebrae: C5-C7 anterior cervical discectomy and fusion. No acute fracture. No aggressive appearing focal osseous lesion or focal pathologic process. Soft tissues and spinal canal: No prevertebral fluid or swelling. No visible canal hematoma. Upper chest: Biapical pleural/pulmonary scarring. Other: Atherosclerotic plaque of the carotid arteries within the neck. IMPRESSION: 1. No acute intracranial abnormality. 2. No acute displaced fracture or  traumatic listhesis of the cervical spine. Electronically Signed   By: Morgane  Naveau M.D.   On: 06/26/2024 19:53   Patient Profile   73 y.o. female with a past medical history of hypertension, hyperlipidemia, carotid artery disease, and h/o chest pain (false positive NST in 07/2020 with cath in 10/21 showing normal coronary arteries) who presented to the [facility] on 7/8 after she synopsized. She was transferred to [facility] for cardiac catheterization given elevated troponins.   Assessment & Plan  Stress cardiomyopathy  Hs troponin 588->1116-> 1221 -> 865 ECG showed no evidence of acute ischemic change LHC 7/9 showed mild CAD with no obstructive disease. LVEDP 21 mmHg.  Echocardiogram LVEF 45-50% with mildly  decreased function with regional wall motion abnormalities. Grade I Diastolic Dysfunction.   She does report significant personal life stress with the death of a sister-in-law several weeks ago, a brother-in-law awaiting renal transplant with other medical conditions, and her husband is currently hospitalized at [facility].  She also has been without her Xanax  prescription for the last week and has been experiencing what sounds to be withdrawal symptoms.  -coreg  3.125 mg BID, recent addition this admission will hold off increasing as she has only received two doses thus far -irbesartan  75 mg formulary sub for home olmesartan  -consider starting a SGLT2i or MRA, will hold off right now given recent contrast administration and future readministration with CT imaging. (See below)  Acute hypoxic respiratory failure  O2 sat 84% on RA on presentation without history of chronic hypoxia. Patient denies systemic infectious symptoms.  She does notice feeling short of breath on exertion without the oxygen.  She does not use oxygen at home.  She is due for her CT lung screening.  Per chart review she does have some mild emphysema changes on a CT Chest in 2024.  Will obtain a CTA Chest for further evaluation  given recent syncopal episode as well as recent fall/hip surgery. Will obtain ambulating oxygen saturation  Syncope Could be multifactorial.  Patient also describes inadequate eating and hydration prior to episode when she was focused on her husband who is in the hospital. She has a history of several syncopal episodes in the last year.  Per telemetry she has remained in sinus rhythm. Would plan for 2 week live monitor at discharge, with consideration of ILR if this is unrevealing as outpatient. Could also consider OP neuro evaluation. Will need instructions regarding avoidance of driving x 6 months at discharge per [place] DMV recommendations.  Hypertension BP: 156/89 -coreg  3.125 mg BID -irbesartan  75 mg   Hyperlipidemia  LDL 53    HDL 64 Continue Pravastatin  40 mg Restart PTA zetia  10 mg  Mild Carotid Artery Disease Continue to monitor outpatient -ASA 81  Anxiety/Depression Patient reported until recently she was taking Xanax  daily but had ran out of her prescription.  For about a week she was anxious and jittery.  Reports improvement in those symptoms since admission.  She has received some benzodiazepines during this admission.  Most likely describing withdrawal symptoms with improvement from benzo administration.  Will continue to monitor Per primary    For questions or updates, please contact [facility] Please consult [email or web address] for contact info under     Signed, Leontine LOISE Salen, PA-C  06/28/2024, 11:31 AM    Patient seen and examined with LL PA-C.  Agree as above, with the following exceptions and changes as noted below. Overall feels ok, findings most consistent with stress CM. Gen: NAD, CV: RRR, no murmurs, Lungs: clear, Abd: soft, Extrem: Warm, well perfused, no edema, Neuro/Psych: alert and oriented x 3, normal mood and affect. All available labs, radiology testing, previous records reviewed. PE study obtained due to hypoxia, negative for PE but notes small  pleural effusions. LVEDP 21 mmHg, will plan to give lasix  x 1 today and if she feels well can consider hospital discharge in next 24 hours. Needs f/u with cardiology after hospital stay and needs repeat echo in 4-6 weeks to recheck LV function.   Retaj Hilbun A Rai Sinagra, MD 06/28/24 4:43 PM

## 2024-06-29 ENCOUNTER — Telehealth: Payer: Self-pay | Admitting: Family Medicine

## 2024-06-29 ENCOUNTER — Inpatient Hospital Stay (HOSPITAL_COMMUNITY): Admit: 2024-06-29 | Discharge: 2024-06-29 | Disposition: A | Discharge status: Home / Self Care

## 2024-06-29 DIAGNOSIS — I10 Essential (primary) hypertension: Secondary | ICD-10-CM | POA: Diagnosis not present

## 2024-06-29 DIAGNOSIS — I5181 Takotsubo syndrome: Secondary | ICD-10-CM | POA: Diagnosis not present

## 2024-06-29 DIAGNOSIS — E785 Hyperlipidemia, unspecified: Secondary | ICD-10-CM | POA: Diagnosis not present

## 2024-06-29 DIAGNOSIS — J9601 Acute respiratory failure with hypoxia: Secondary | ICD-10-CM | POA: Diagnosis not present

## 2024-06-29 DIAGNOSIS — R7989 Other specified abnormal findings of blood chemistry: Secondary | ICD-10-CM | POA: Diagnosis not present

## 2024-06-29 DIAGNOSIS — E876 Hypokalemia: Secondary | ICD-10-CM | POA: Diagnosis not present

## 2024-06-29 DIAGNOSIS — R55 Syncope and collapse: Secondary | ICD-10-CM

## 2024-06-29 DIAGNOSIS — I214 Non-ST elevation (NSTEMI) myocardial infarction: Secondary | ICD-10-CM

## 2024-06-29 DIAGNOSIS — F419 Anxiety disorder, unspecified: Secondary | ICD-10-CM | POA: Diagnosis not present

## 2024-06-29 LAB — CBC
HCT: 41.7 % (ref 36.0–46.0)
Hemoglobin: 14.1 g/dL (ref 12.0–15.0)
MCH: 32.5 pg (ref 26.0–34.0)
MCHC: 33.8 g/dL (ref 30.0–36.0)
MCV: 96.1 fL (ref 80.0–100.0)
Platelets: 229 K/uL (ref 150–400)
RBC: 4.34 MIL/uL (ref 3.87–5.11)
RDW: 13.1 % (ref 11.5–15.5)
WBC: 6.9 K/uL (ref 4.0–10.5)
nRBC: 0 % (ref 0.0–0.2)

## 2024-06-29 LAB — BASIC METABOLIC PANEL WITH GFR
Anion gap: 10 (ref 5–15)
BUN: 6 mg/dL — ABNORMAL LOW (ref 8–23)
CO2: 27 mmol/L (ref 22–32)
Calcium: 8.9 mg/dL (ref 8.9–10.3)
Chloride: 99 mmol/L (ref 98–111)
Creatinine, Ser: 0.84 mg/dL (ref 0.44–1.00)
GFR, Estimated: >60 mL/min (ref >60)
Glucose, Bld: 101 mg/dL — ABNORMAL HIGH (ref 70–99)
Potassium: 3.5 mmol/L (ref 3.5–5.1)
Sodium: 136 mmol/L (ref 135–145)

## 2024-06-29 MED ORDER — FUROSEMIDE 20 MG PO TABS: 20.0000 mg | ORAL_TABLET | Freq: Every day | ORAL | 0 refills | Status: DC | PRN

## 2024-06-29 MED ORDER — CARVEDILOL 3.125 MG PO TABS: 3.1250 mg | ORAL_TABLET | Freq: Two times a day (BID) | ORAL | 0 refills | Status: DC

## 2024-06-29 NOTE — Discharge Summary (Signed)
 Physician Discharge Summary  CHRISTINA GINTZ FMW:[id number] DOB: February 04, 1951 DOA: 06/26/2024  PCP: Dettinger, Fonda LABOR, MD  Admit date: 06/26/2024 Discharge date: 06/29/2024  Admitted From: Home Disposition:  Home  Recommendations for Outpatient Follow-up:  Follow up with PCP in 1-2 weeks Follow-up with cardiology as scheduled:  Home Health: None Equipment/Devices: None  Discharge Condition: Stable CODE STATUS: Full Diet recommendation: Low-salt low-fat low-carb diet  Brief/Interim Summary: Jody Wilson is a 73 y.o. female with medical history significant for hypertension, hyperlipidemia, depression, and anxiety who presents after a syncopal episode with fall.  In the ED she was noted to have significant elevation in troponin levels and cardiology is recommending transfer to [facility] for further evaluation with initiation of IV heparin  drip.    Patient admitted to our facility for further evaluation and treatment with cardiology, echocardiogram shows EF of 45% with no wall motion abnormality consistent with stress cardiomyopathy.  Patient's cardiac cath was without any overt obstructive pathology.  Given resolution of symptoms and findings as above otherwise stable and agreeable for discharge home.  Transient hypoxic respiratory failure at outside facility resolved prior to admission, likely secondary to cardiomyopathy which improved with volume management.  CTA performed in the interim to rule out pulmonary embolism given acute findings which was unremarkable.  Continue home medications otherwise without changes unless specified as below.   Discharge Diagnoses:  Principal Problem:   Syncope Active Problems:   Hypertension   Hyperlipidemia with target LDL less than 100   Anxiety and depression   Hypokalemia   Elevated troponin   Acute respiratory failure with hypoxia (HCC)   NSTEMI (non-ST elevated myocardial infarction) (HCC)  Syncope likely in the setting of stress  cardiomyopathy - 2D echocardiogram with LVEF 45% and new wall motion abnormalities noted - Cardiac cath without clear obstruction - Echo consistent with Takotsubo cardiomyopathy   Elevated troponin rule out ACS - ACS ruled out, see above with negative cardiac catheterization   Acute hypoxic respiratory failure, resolved - Requiring 2 Lpm supplemental O2 in ED  - CXR clear and exam unremarkable; age-adjusted d-dimer is negative  - Start incentive spirometry, currently on room air at rest - CTA chest negative for PE - Echo consistent with Takotsubo cardiomyopathy   Hypokalemia, resolved - Replacing  Hypertension - Continue ARB  Hyperlipidemia - Continue statin    Depression, anxiety  - Continue Wellbutrin , Celexa , and as-needed Xanax    - Patient Xanax  recently ran out, concern for prior withdrawals (weeks prior, no acute symptoms now) - Numerous stressors concurrently including multiple sick family members (has been hospitalized and sister-in-law recently deceased   Discharge Instructions  Discharge Instructions     (HEART FAILURE PATIENTS) Call MD:  Anytime you have any of the following symptoms: 1) 3 pound weight gain in 24 hours or 5 pounds in 1 week 2) shortness of breath, with or without a dry hacking cough 3) swelling in the hands, feet or stomach 4) if you have to sleep on extra pillows at night in order to breathe.   Complete by: As directed    Call MD for:  difficulty breathing, headache or visual disturbances   Complete by: As directed    Call MD for:  extreme fatigue   Complete by: As directed    Call MD for:  hives   Complete by: As directed    Call MD for:  persistant dizziness or light-headedness   Complete by: As directed    Call MD for:  persistant  nausea and vomiting   Complete by: As directed    Call MD for:  severe uncontrolled pain   Complete by: As directed    Call MD for:  temperature >100.4   Complete by: As directed    Diet - low sodium heart healthy    Complete by: As directed    Increase activity slowly   Complete by: As directed       Allergies as of 06/29/2024       Reactions   Tetracyclines & Related Itching, Swelling        Medication List     TAKE these medications    ALPRAZolam  0.5 MG tablet Commonly known as: XANAX  Take 1 tablet (0.5 mg total) by mouth 2 (two) times daily as needed for anxiety.   aspirin  EC 81 MG tablet TAKE 1 PO BID THRU 05/13/24 THEN RESUME 1 PO DAILY   buPROPion  150 MG 24 hr tablet Commonly known as: Wellbutrin  XL Take 1 tablet (150 mg total) by mouth daily.   carvedilol  3.125 MG tablet Commonly known as: COREG  Take 1 tablet (3.125 mg total) by mouth 2 (two) times daily with a meal.   citalopram  40 MG tablet Commonly known as: CELEXA  Take 1 tablet (40 mg total) by mouth daily.   cyanocobalamin  1000 MCG/ML injection Commonly known as: VITAMIN B12 INJECT 1 ML IM EVERY 28 DAYS   docusate sodium  100 MG capsule Commonly known as: COLACE Take 1 capsule (100 mg total) by mouth 2 (two) times daily.   ergocalciferol  1.25 MG (50000 UT) capsule Commonly known as: VITAMIN D2 Take 1 capsule (50,000 Units total) by mouth every 14 (fourteen) days.   ezetimibe  10 MG tablet Commonly known as: ZETIA  Take 10 mg by mouth daily.   feeding supplement Liqd Take 237 mLs by mouth 2 (two) times daily between meals.   folic acid  1 MG tablet Commonly known as: FOLVITE  Take 1 mg by mouth every other day.   furosemide  20 MG tablet Commonly known as: Lasix  Take 1 tablet (20 mg total) by mouth daily as needed (for swelling/shortness of breath).   methocarbamol  500 MG tablet Commonly known as: ROBAXIN  Take 1 tablet (500 mg total) by mouth 3 (three) times daily.   multivitamin with minerals Tabs tablet Take 1 tablet by mouth daily.   olmesartan  20 MG tablet Commonly known as: Benicar  Take 0.5 tablets (10 mg total) by mouth daily.   ondansetron  4 MG tablet Commonly known as: ZOFRAN  Take 1  tablet (4 mg total) by mouth every 8 (eight) hours as needed for nausea or vomiting.   pantoprazole  40 MG tablet Commonly known as: PROTONIX  Take 1 tablet (40 mg total) by mouth 2 (two) times daily before a meal.   polyethylene glycol 17 g packet Commonly known as: MIRALAX  / GLYCOLAX  Take 17 g by mouth daily as needed for mild constipation.   pravastatin  40 MG tablet Commonly known as: PRAVACHOL  Take 1 tablet (40 mg total) by mouth every evening.        Follow-up Information     Rana Lum CROME, NP Follow up.   Specialty: Cardiology Why: [facility] [address] location Cardiology follow-up arranged on Friday July 13, 2024 at 8:50 AM Arrive 15 minutes prior to appointment to check in [place] is one of our nurse practitioners that works with Dr. Michele and our team Contact information: [address] [place] [address] [phone]  Allergies  Allergen Reactions   Tetracyclines & Related Itching and Swelling    Consultations: Cardiology   Procedures/Studies: CT Angio Chest Pulmonary Embolism (PE) W or WO Contrast Result Date: 06/28/2024 CLINICAL DATA:  Pulmonary embolism (PE) suspected, low to intermediate prob, positive D-dimer EXAM: CT ANGIOGRAPHY CHEST WITH CONTRAST TECHNIQUE: Multidetector CT imaging of the chest was performed using the standard protocol during bolus administration of intravenous contrast. Multiplanar CT image reconstructions and MIPs were obtained to evaluate the vascular anatomy. RADIATION DOSE REDUCTION: This exam was performed according to the departmental dose-optimization program which includes automated exposure control, adjustment of the mA and/or kV according to patient size and/or use of iterative reconstruction technique. CONTRAST:  65mL OMNIPAQUE  IOHEXOL  350 MG/ML SOLN COMPARISON:  Chest radiographs 06/26/2024. Noncontrast chest CT 09/28/2023 and chest CTA 07/02/2022 FINDINGS: Cardiovascular: The  pulmonary arteries are well opacified with contrast to the level of the segmental branches. There is no evidence of acute pulmonary embolism. Diffuse atherosclerosis of the aorta, great vessels and coronary arteries. There is limited opacification of the aortic lumen but no evidence of acute systemic arterial abnormality. The heart size is normal. There is no pericardial effusion. Mediastinum/Nodes: There are no enlarged mediastinal, hilar or axillary lymph nodes.Large hiatal hernia again noted. The thyroid  gland and trachea appear unremarkable. Lungs/Pleura: New small right-greater-than-left pleural effusions with associated dependent airspace opacities bilaterally, likely atelectasis. Underlying mild centrilobular emphysema with biapical scarring. Upper abdomen: No acute findings. Possible punctate nonobstructing left renal calculi. Previous cholecystectomy. Musculoskeletal/Chest wall: There is no chest wall mass or suspicious osseous finding. Previous lower cervical fusion and spinal augmentation at T11. Mild multilevel spondylosis. Old fracture of the sternal body. Old rib fractures bilaterally. Review of the MIP images confirms the above findings. IMPRESSION: 1. No evidence of acute pulmonary embolism or other acute vascular findings in the chest. 2. New small right-greater-than-left pleural effusions with associated dependent airspace opacities bilaterally, likely atelectasis. 3. Stable large hiatal hernia. 4. Aortic Atherosclerosis (ICD10-I70.0) and Emphysema (ICD10-J43.9). Electronically Signed   By: Elsie Perone M.D.   On: 06/28/2024 15:31   CARDIAC CATHETERIZATION Result Date: 06/27/2024   2nd Diag lesion is 40% stenosed. Mild plaque in the ostium of the diagonal branch but no obstructive disease noted in the LAD, Circumflex or RCA. 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 Medical management of mild CAD   ECHOCARDIOGRAM COMPLETE Result  Date: 06/27/2024    ECHOCARDIOGRAM REPORT   Patient Name:   ZAHARI FAZZINO Date of Exam: 06/27/2024 Medical Rec #:  [id number]       Height:       65.0 in Accession #:    [id number]      Weight:       141.1 lb Date of Birth:  1951/09/21      BSA:          1.706 m Patient Age:    72 years        BP:           146/86 mmHg Patient Gender: F               HR:           80 bpm. Exam Location:  [name] Procedure: 2D Echo, 3D Echo, Cardiac Doppler, Color Doppler and Strain Analysis            (Both Spectral and Color Flow Doppler were utilized during  procedure). Indications:    Syncope R55 / Elevated Troponin  History:        Patient has prior history of Echocardiogram examinations, most                 recent 03/05/2024. Signs/Symptoms:Syncope; Risk                 Factors:Hypertension, Dyslipidemia and Tobacco abuse.  Sonographer:    Aida Pizza RCS Referring Phys: [id number] TIMOTHY S OPYD  Sonographer Comments: Global longitudinal strain was attempted. IMPRESSIONS  1. Left ventricular ejection fraction, by estimation, is 45 to 50%. Left ventricular ejection fraction by 3D volume is 51 %. The left ventricle has mildly decreased function. 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 Left ventricular diastolic parameters are consistent with Grade I diastolic dysfunction (impaired relaxation). The average left ventricular global longitudinal strain is -13.4  %. The global longitudinal strain is abnormal.  2. Right ventricular systolic function is normal. The right ventricular size is normal. There is mildly elevated pulmonary artery systolic pressure. The estimated right ventricular systolic pressure is 37.6 mmHg.  3. The mitral valve is normal in structure. No evidence of mitral valve regurgitation. No evidence of mitral stenosis.  4. The aortic valve has an indeterminant number of cusps. Aortic valve  regurgitation is not visualized. No aortic stenosis is present.  5. The inferior vena cava is normal in size with greater than 50% respiratory variability, suggesting right atrial pressure of 3 mmHg. Comparison(s): Prior images reviewed side by side. Changes from prior study are noted. LVEF mildly reduced to 45% now with new RWMA. FINDINGS  Left Ventricle: Left ventricular ejection fraction, by estimation, is 45 to 50%. Left ventricular ejection fraction by 3D volume is 51 %. The left ventricle has mildly decreased function. The left ventricle demonstrates regional wall motion abnormalities. The average left ventricular global longitudinal strain is -13.4 %. Strain was performed and the global longitudinal strain is abnormal. The left ventricular internal cavity size was normal in size. There is no left ventricular hypertrophy. Left ventricular diastolic parameters are consistent with Grade I diastolic dysfunction (impaired relaxation). Normal left ventricular filling pressure.  LV Wall Scoring: The apical lateral segment, mid anterolateral segment, mid inferoseptal segment, apical septal segment, and apex are akinetic. The mid and distal anterior wall, anterior septum, and apical inferior segment are hypokinetic. The inferior wall, posterior wall, basal anterolateral segment, basal anterior segment, and basal inferoseptal segment are normal. Right Ventricle: The right ventricular size is normal. No increase in right ventricular wall thickness. Right ventricular systolic function is normal. There is mildly elevated pulmonary artery systolic pressure. The tricuspid regurgitant velocity is 2.94  m/s, and with an assumed right atrial pressure of 3 mmHg, the estimated right ventricular systolic pressure is 37.6 mmHg. Left Atrium: Left atrial size was normal in size. Right Atrium: Right atrial size was normal in size. Pericardium: There is no evidence of pericardial effusion. Mitral Valve: The mitral valve is normal in  structure. No evidence of mitral valve regurgitation. No evidence of mitral valve stenosis. Tricuspid Valve: The tricuspid valve is normal in structure. Tricuspid valve regurgitation is mild . No evidence of tricuspid stenosis. Aortic Valve: The aortic valve has an indeterminant number of cusps. Aortic valve regurgitation is not visualized. No aortic stenosis is present. Pulmonic Valve: The pulmonic valve was not well visualized. Pulmonic valve regurgitation is not visualized. No evidence of pulmonic stenosis. Aorta:  The aortic root is normal in size and structure. Venous: The inferior vena cava is normal in size with greater than 50% respiratory variability, suggesting right atrial pressure of 3 mmHg. IAS/Shunts: No atrial level shunt detected by color flow Doppler. Additional Comments: 3D was performed not requiring image post processing on an independent workstation and was abnormal.  LEFT VENTRICLE PLAX 2D LVIDd:         4.50 cm         Diastology LVIDs:         2.90 cm         LV e' medial:    6.31 cm/s LV PW:         1.00 cm         LV E/e' medial:  14.4 LV IVS:        0.90 cm         LV e' lateral:   6.42 cm/s LVOT diam:     1.90 cm         LV E/e' lateral: 14.2 LV SV:         49 LV SV Index:   29              2D Longitudinal LVOT Area:     2.84 cm        Strain                                2D Strain GLS   -13.4 %                                Avg:                                 3D Volume EF                                LV 3D EF:    Left                                             ventricul                                             ar                                             ejection                                             fraction                                             by 3D  volume is                                             51 %.                                 3D Volume EF:                                3D EF:        51 %                                 LV EDV:       109 ml                                LV ESV:       53 ml                                LV SV:        56 ml RIGHT VENTRICLE RV S prime:     15.00 cm/s TAPSE (M-mode): 2.2 cm LEFT ATRIUM             Index        RIGHT ATRIUM           Index LA diam:        2.70 cm 1.58 cm/m   RA Area:     13.60 cm LA Vol (A2C):   37.1 ml 21.75 ml/m  RA Volume:   32.20 ml  18.88 ml/m LA Vol (A4C):   45.4 ml 26.62 ml/m LA Biplane Vol: 41.6 ml 24.39 ml/m  AORTIC VALVE LVOT Vmax:   79.30 cm/s LVOT Vmean:  53.300 cm/s LVOT VTI:    0.172 m  AORTA Ao Root diam: 2.90 cm MITRAL VALVE                TRICUSPID VALVE MV Area (PHT): 5.13 cm     TR Peak grad:   34.6 mmHg MV Decel Time: 148 msec     TR Vmax:        294.00 cm/s MV E velocity: 91.10 cm/s MV A velocity: 114.00 cm/s  SHUNTS MV E/A ratio:  0.80         Systemic VTI:  0.17 m                             Systemic Diam: 1.90 cm Vishnu Priya Mallipeddi Electronically signed by Diannah Late Mallipeddi Signature Date/Time: 06/27/2024/10:44:04 AM    Final    DG Chest Portable 1 View Result Date: 06/26/2024 CLINICAL DATA:  Fall hypoxia EXAM: PORTABLE CHEST 1 VIEW COMPARISON:  04/13/2024 FINDINGS: Hardware in the cervical spine. Moderate to large hiatal hernia. No acute airspace disease, pleural effusion or pneumothorax. Stable cardiomediastinal silhouette with aortic atherosclerosis IMPRESSION: No active disease. Moderate to large hiatal hernia. Electronically Signed   By: Luke Bun M.D.   On: 06/26/2024 19:56   CT HEAD WO CONTRAST ( ) Result  Date: 06/26/2024 CLINICAL DATA:  Head trauma, minor (Age >= 65y); Neck trauma (Age >= 65y) Fall EXAM: CT HEAD WITHOUT CONTRAST CT CERVICAL SPINE WITHOUT CONTRAST TECHNIQUE: Multidetector CT imaging of the head and cervical spine was performed following the standard protocol without intravenous contrast. Multiplanar CT image reconstructions of the cervical spine were also generated. RADIATION DOSE REDUCTION: This  exam was performed according to the departmental dose-optimization program which includes automated exposure control, adjustment of the mA and/or kV according to patient size and/or use of iterative reconstruction technique. COMPARISON:  CT head and C-spine 04/13/2024 FINDINGS: CT HEAD FINDINGS Brain: No evidence of large-territorial acute infarction. No parenchymal hemorrhage. No mass lesion. No extra-axial collection. No mass effect or midline shift. No hydrocephalus. Basilar cisterns are patent. Vascular: No hyperdense vessel. Atherosclerotic calcifications are present within the cavernous internal carotid and vertebral arteries. Skull: No acute fracture or focal lesion. Sinuses/Orbits: Left sphenoid sinus mucosal thickening. Left sphenoid wall hypertrophy. Otherwise paranasal sinuses and mastoid air cells are clear. The orbits are unremarkable. Other: None. CT CERVICAL SPINE FINDINGS Alignment: Normal. Skull base and vertebrae: C5-C7 anterior cervical discectomy and fusion. No acute fracture. No aggressive appearing focal osseous lesion or focal pathologic process. Soft tissues and spinal canal: No prevertebral fluid or swelling. No visible canal hematoma. Upper chest: Biapical pleural/pulmonary scarring. Other: Atherosclerotic plaque of the carotid arteries within the neck. IMPRESSION: 1. No acute intracranial abnormality. 2. No acute displaced fracture or traumatic listhesis of the cervical spine. Electronically Signed   By: Morgane  Naveau M.D.   On: 06/26/2024 19:53   CT CERVICAL SPINE WO CONTRAST Result Date: 06/26/2024 CLINICAL DATA:  Head trauma, minor (Age >= 65y); Neck trauma (Age >= 65y) Fall EXAM: CT HEAD WITHOUT CONTRAST CT CERVICAL SPINE WITHOUT CONTRAST TECHNIQUE: Multidetector CT imaging of the head and cervical spine was performed following the standard protocol without intravenous contrast. Multiplanar CT image reconstructions of the cervical spine were also generated. RADIATION DOSE  REDUCTION: This exam was performed according to the departmental dose-optimization program which includes automated exposure control, adjustment of the mA and/or kV according to patient size and/or use of iterative reconstruction technique. COMPARISON:  CT head and C-spine 04/13/2024 FINDINGS: CT HEAD FINDINGS Brain: No evidence of large-territorial acute infarction. No parenchymal hemorrhage. No mass lesion. No extra-axial collection. No mass effect or midline shift. No hydrocephalus. Basilar cisterns are patent. Vascular: No hyperdense vessel. Atherosclerotic calcifications are present within the cavernous internal carotid and vertebral arteries. Skull: No acute fracture or focal lesion. Sinuses/Orbits: Left sphenoid sinus mucosal thickening. Left sphenoid wall hypertrophy. Otherwise paranasal sinuses and mastoid air cells are clear. The orbits are unremarkable. Other: None. CT CERVICAL SPINE FINDINGS Alignment: Normal. Skull base and vertebrae: C5-C7 anterior cervical discectomy and fusion. No acute fracture. No aggressive appearing focal osseous lesion or focal pathologic process. Soft tissues and spinal canal: No prevertebral fluid or swelling. No visible canal hematoma. Upper chest: Biapical pleural/pulmonary scarring. Other: Atherosclerotic plaque of the carotid arteries within the neck. IMPRESSION: 1. No acute intracranial abnormality. 2. No acute displaced fracture or traumatic listhesis of the cervical spine. Electronically Signed   By: Morgane  Naveau M.D.   On: 06/26/2024 19:53     Subjective: No acute issues/events overnight   Discharge Exam: Vitals:   06/29/24 0805 06/29/24 0930  BP: 128/77 132/75  Pulse: 60 75  Resp: 20   Temp: 98.1 F (36.7 C)   SpO2:     Vitals:   06/28/24 2351 06/29/24 0421 06/29/24 0805  06/29/24 0930  BP: 126/73 130/68 128/77 132/75  Pulse: 63 62 60 75  Resp: 18 18 20    Temp: 98.2 F (36.8 C) 98.4 F (36.9 C) 98.1 F (36.7 C)   TempSrc: Oral Oral Oral    SpO2: (!) 86% 95%    Weight:      Height:        General: Pt is alert, awake, not in acute distress Cardiovascular: RRR, S1/S2 +, no rubs, no gallops Respiratory: CTA bilaterally, no wheezing, no rhonchi Abdominal: Soft, NT, ND, bowel sounds + Extremities: no edema, no cyanosis    The results of significant diagnostics from this hospitalization (including imaging, microbiology, ancillary and laboratory) are listed below for reference.     Microbiology: No results found for this or any previous visit (from the past 240 hours).   Labs: BNP (last 3 results) Recent Labs    06/26/24 2009  BNP 56.0   Basic Metabolic Panel: Recent Labs  Lab 06/26/24 2009 06/27/24 0148 06/27/24 0617 06/28/24 0434 06/29/24 0428  NA 139  --  138 137 136  K 3.2*  --  3.1* 3.6 3.5  CL 103  --  105 105 99  CO2 25  --  23 25 27   GLUCOSE 112*  --  103* 109* 101*  BUN 9  --  8 7* 6*  CREATININE 0.96  --  0.70 0.95 0.84  CALCIUM  9.3  --  8.5* 8.6* 8.9  MG  --  1.8  --  1.6*  --    Liver Function Tests: Recent Labs  Lab 06/26/24 2009  AST 19  ALT 11  ALKPHOS 71  BILITOT 1.3*  PROT 7.4  ALBUMIN 4.0   No results for input(s): LIPASE, AMYLASE in the last 168 hours. No results for input(s): AMMONIA in the last 168 hours. CBC: Recent Labs  Lab 06/26/24 2009 06/27/24 0617 06/28/24 0434 06/29/24 0428  WBC 9.2 6.1 8.7 6.9  NEUTROABS 7.3  --   --   --   HGB 14.0 12.6 13.4 14.1  HCT 41.2 39.8 41.1 41.7  MCV 97.6 100.8* 97.6 96.1  PLT 254 194 215 229   Cardiac Enzymes: No results for input(s): CKTOTAL, CKMB, CKMBINDEX, TROPONINI in the last 168 hours. BNP: Invalid input(s): POCBNP CBG: No results for input(s): GLUCAP in the last 168 hours. D-Dimer Recent Labs    06/26/24 2009  DDIMER 0.55*   Hgb A1c No results for input(s): HGBA1C in the last 72 hours. Lipid Profile Recent Labs    06/28/24 0434  CHOL 142  HDL 64  LDLCALC 53  TRIG 125  CHOLHDL 2.2    Thyroid  function studies No results for input(s): TSH, T4TOTAL, T3FREE, THYROIDAB in the last 72 hours.  Invalid input(s): FREET3 Anemia work up No results for input(s): VITAMINB12, FOLATE, FERRITIN, TIBC, IRON, RETICCTPCT in the last 72 hours. Urinalysis    Component Value Date/Time   COLORURINE YELLOW 07/11/2020 2212   APPEARANCEUR Clear 01/22/2022 1411   LABSPEC 1.008 07/11/2020 2212   PHURINE 6.0 07/11/2020 2212   GLUCOSEU Negative 01/22/2022 1411   HGBUR NEGATIVE 07/11/2020 2212   BILIRUBINUR Negative 01/22/2022 1411   KETONESUR NEGATIVE 07/11/2020 2212   PROTEINUR Negative 01/22/2022 1411   PROTEINUR NEGATIVE 07/11/2020 2212   UROBILINOGEN negative 04/11/2014 1713   NITRITE Negative 01/22/2022 1411   NITRITE NEGATIVE 07/11/2020 2212   LEUKOCYTESUR Negative 01/22/2022 1411   LEUKOCYTESUR NEGATIVE 07/11/2020 2212   Sepsis Labs Recent Labs  Lab 06/26/24 2009 06/27/24 9382  06/28/24 0434 06/29/24 0428  WBC 9.2 6.1 8.7 6.9   Microbiology No results found for this or any previous visit (from the past 240 hours).   Time coordinating discharge: Over 30 minutes  SIGNED:   Elsie JAYSON Montclair, DO [facility] 06/29/2024, 3:53 PM Pager   If 7PM-7AM, please contact night-coverage [email or web address]

## 2024-06-29 NOTE — Telephone Encounter (Signed)
 Patient was prescribed this by another office which is why we did not note or see that it was from us  the last time which is technically a breach of her controlled substance contract, if she would like to schedule an appointment to come in to talk about this then we can.

## 2024-06-29 NOTE — Plan of Care (Signed)
  Problem: Education: Goal: Knowledge of General Education information will improve Description: Including pain rating scale, medication(s)/side effects and non-pharmacologic comfort measures Outcome: Completed/Met   Problem: Health Behavior/Discharge Planning: Goal: Ability to manage health-related needs will improve Outcome: Completed/Met   Problem: Clinical Measurements: Goal: Ability to maintain clinical measurements within normal limits will improve Outcome: Completed/Met Goal: Will remain free from infection Outcome: Completed/Met Goal: Diagnostic test results will improve Outcome: Completed/Met Goal: Respiratory complications will improve Outcome: Completed/Met Goal: Cardiovascular complication will be avoided Outcome: Completed/Met   Problem: Activity: Goal: Risk for activity intolerance will decrease Outcome: Completed/Met   Problem: Nutrition: Goal: Adequate nutrition will be maintained Outcome: Completed/Met   Problem: Coping: Goal: Level of anxiety will decrease Outcome: Progressing   Problem: Elimination: Goal: Will not experience complications related to bowel motility Outcome: Progressing Goal: Will not experience complications related to urinary retention Outcome: Completed/Met   Problem: Pain Managment: Goal: General experience of comfort will improve and/or be controlled Outcome: Progressing   Problem: Safety: Goal: Ability to remain free from injury will improve Outcome: Completed/Met   Problem: Skin Integrity: Goal: Risk for impaired skin integrity will decrease Outcome: Completed/Met   Problem: Education: Goal: Understanding of CV disease, CV risk reduction, and recovery process will improve Outcome: Completed/Met Goal: Individualized Educational Video(s) Outcome: Completed/Met   Problem: Activity: Goal: Ability to return to baseline activity level will improve Outcome: Completed/Met   Problem: Cardiovascular: Goal: Ability to achieve  and maintain adequate cardiovascular perfusion will improve Outcome: Completed/Met Goal: Vascular access site(s) Level 0-1 will be maintained Outcome: Completed/Met   Problem: Health Behavior/Discharge Planning: Goal: Ability to safely manage health-related needs after discharge will improve Outcome: Completed/Met

## 2024-06-29 NOTE — Telephone Encounter (Signed)
 Spoke with pt. She is not sure why Dettinger has not continued prescribing her medication. When she broke her hip, PT prescribed one week supply. Pt feels Dettinger should have sent refills in June but did not. Pts contact is due in July.

## 2024-06-29 NOTE — Progress Notes (Signed)
 Rounding Note   Patient Name: Jody Wilson Date of Encounter: 06/29/2024  [facility] Cardiologist: Jody Large, DO   Subjective Doing well today, noted she peed a lot after the lasix  injection.Improvement in her breathing.  Denied angina or DOE when ambulating this morning.   Scheduled Meds:  aspirin  EC  81 mg Oral Daily   buPROPion   150 mg Oral Daily   carvedilol   3.125 mg Oral BID WC   citalopram   40 mg Oral Daily   ezetimibe   10 mg Oral Daily   irbesartan   75 mg Oral Daily   pantoprazole   40 mg Oral BID AC   pravastatin   40 mg Oral QPM   sodium chloride  flush  3 mL Intravenous Q12H   sodium chloride  flush  3 mL Intravenous Q12H   Continuous Infusions:  PRN Meds: acetaminophen  **OR** acetaminophen , ALPRAZolam , ondansetron  **OR** ondansetron  (ZOFRAN ) IV, oxyCODONE , polyethylene glycol, sodium chloride  flush   Vital Signs  Vitals:   06/28/24 2023 06/28/24 2351 06/29/24 0421 06/29/24 0805  BP: 118/63 126/73 130/68 128/77  Pulse: 68 63 62 60  Resp: 16 18 18 20   Temp: 98.5 F (36.9 C) 98.2 F (36.8 C) 98.4 F (36.9 C) 98.1 F (36.7 C)  TempSrc: Oral Oral Oral Oral  SpO2: 95% (!) 86% 95%   Weight:      Height:       No intake or output data in the 24 hours ending 06/29/24 0916    06/27/2024    5:10 PM 06/26/2024    7:13 PM 05/23/2024    9:04 AM  Last 3 Weights  Weight (lbs) 142 lb 10.2 oz 141 lb 1.5 oz 140 lb  Weight (kg) 64.7 kg 64 kg 63.504 kg      Telemetry Sinus Rhythm HR 65 - Personally Reviewed  Physical Exam GEN: No acute distress.   Cardiac: RRR, no murmurs, rubs, or gallops.  Respiratory: Clear to auscultation bilaterally. MS: No edema; No deformity. Neuro:  Nonfocal  Psych: Normal affect   Labs High Sensitivity Troponin:   Recent Labs  Lab 06/26/24 2009 06/26/24 2211 06/27/24 0148 06/27/24 0617  TROPONINIHS 588* 1,116* 1,221* 865*     Chemistry Recent Labs  Lab 06/26/24 2009 06/27/24 0148 06/27/24 0617 06/28/24 0434  06/29/24 0428  NA 139  --  138 137 136  K 3.2*  --  3.1* 3.6 3.5  CL 103  --  105 105 99  CO2 25  --  23 25 27   GLUCOSE 112*  --  103* 109* 101*  BUN 9  --  8 7* 6*  CREATININE 0.96  --  0.70 0.95 0.84  CALCIUM  9.3  --  8.5* 8.6* 8.9  MG  --  1.8  --  1.6*  --   PROT 7.4  --   --   --   --   ALBUMIN 4.0  --   --   --   --   AST 19  --   --   --   --   ALT 11  --   --   --   --   ALKPHOS 71  --   --   --   --   BILITOT 1.3*  --   --   --   --   GFRNONAA >60  --  >60 >60 >60  ANIONGAP 11  --  10 7 10     Lipids  Recent Labs  Lab 06/28/24 0434  CHOL 142  TRIG  125  HDL 64  LDLCALC 53  CHOLHDL 2.2    Hematology Recent Labs  Lab 06/27/24 0617 06/28/24 0434 06/29/24 0428  WBC 6.1 8.7 6.9  RBC 3.95 4.21 4.34  HGB 12.6 13.4 14.1  HCT 39.8 41.1 41.7  MCV 100.8* 97.6 96.1  MCH 31.9 31.8 32.5  MCHC 31.7 32.6 33.8  RDW 13.4 13.2 13.1  PLT 194 215 229   Thyroid  No results for input(s): TSH, FREET4 in the last 168 hours.  BNP Recent Labs  Lab 06/26/24 2009  BNP 56.0    DDimer  Recent Labs  Lab 06/26/24 2009  DDIMER 0.55*     Radiology  CT Angio Chest Pulmonary Embolism (PE) W or WO Contrast Result Date: 06/28/2024 CLINICAL DATA:  Pulmonary embolism (PE) suspected, low to intermediate prob, positive D-dimer EXAM: CT ANGIOGRAPHY CHEST WITH CONTRAST TECHNIQUE: Multidetector CT imaging of the chest was performed using the standard protocol during bolus administration of intravenous contrast. Multiplanar CT image reconstructions and MIPs were obtained to evaluate the vascular anatomy. RADIATION DOSE REDUCTION: This exam was performed according to the departmental dose-optimization program which includes automated exposure control, adjustment of the mA and/or kV according to patient size and/or use of iterative reconstruction technique. CONTRAST:  65mL OMNIPAQUE  IOHEXOL  350 MG/ML SOLN COMPARISON:  Chest radiographs 06/26/2024. Noncontrast chest CT 09/28/2023 and chest CTA  07/02/2022 FINDINGS: Cardiovascular: The pulmonary arteries are well opacified with contrast to the level of the segmental branches. There is no evidence of acute pulmonary embolism. Diffuse atherosclerosis of the aorta, great vessels and coronary arteries. There is limited opacification of the aortic lumen but no evidence of acute systemic arterial abnormality. The heart size is normal. There is no pericardial effusion. Mediastinum/Nodes: There are no enlarged mediastinal, hilar or axillary lymph nodes.Wilson hiatal hernia again noted. The thyroid  gland and trachea appear unremarkable. Lungs/Pleura: New small right-greater-than-left pleural effusions with associated dependent airspace opacities bilaterally, likely atelectasis. Underlying mild centrilobular emphysema with biapical scarring. Upper abdomen: No acute findings. Possible punctate nonobstructing left renal calculi. Previous cholecystectomy. Musculoskeletal/Chest wall: There is no chest wall mass or suspicious osseous finding. Previous lower cervical fusion and spinal augmentation at T11. Mild multilevel spondylosis. Old fracture of the sternal body. Old rib fractures bilaterally. Review of the MIP images confirms the above findings. IMPRESSION: 1. No evidence of acute pulmonary embolism or other acute vascular findings in the chest. 2. New small right-greater-than-left pleural effusions with associated dependent airspace opacities bilaterally, likely atelectasis. 3. Stable Wilson hiatal hernia. 4. Aortic Atherosclerosis (ICD10-I70.0) and Emphysema (ICD10-J43.9). Electronically Signed   By: Jody Wilson M.D.   On: 06/28/2024 15:31   CARDIAC CATHETERIZATION Result Date: 06/27/2024   2nd Diag lesion is 40% stenosed. Mild plaque in the ostium of the diagonal branch but no obstructive disease noted in the LAD, Circumflex or RCA. 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 Medical management of mild  CAD   ECHOCARDIOGRAM COMPLETE Result Date: 06/27/2024    ECHOCARDIOGRAM REPORT   Patient Name:   Jody Wilson Date of Exam: 06/27/2024 Medical Rec #:  [id number]       Height:       65.0 in Accession #:    [id number]      Weight:       141.1 lb Date of Birth:  22-Feb-1951      BSA:          1.706 m Patient Age:    19 years  BP:           146/86 mmHg Patient Gender: F               HR:           80 bpm. Exam Location:  [name] Procedure: 2D Echo, 3D Echo, Cardiac Doppler, Color Doppler and Strain Analysis            (Both Spectral and Color Flow Doppler were utilized during            procedure). Indications:    Syncope R55 / Elevated Troponin  History:        Patient has prior history of Echocardiogram examinations, most                 recent 03/05/2024. Signs/Symptoms:Syncope; Risk                 Factors:Hypertension, Dyslipidemia and Tobacco abuse.  Sonographer:    Aida Pizza RCS Referring Phys: [id number] TIMOTHY S OPYD  Sonographer Comments: Global longitudinal strain was attempted. IMPRESSIONS  1. Left ventricular ejection fraction, by estimation, is 45 to 50%. Left ventricular ejection fraction by 3D volume is 51 %. The left ventricle has mildly decreased function. 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 Left ventricular diastolic parameters are consistent with Grade I diastolic dysfunction (impaired relaxation). The average left ventricular global longitudinal strain is -13.4  %. The global longitudinal strain is abnormal.  2. Right ventricular systolic function is normal. The right ventricular size is normal. There is mildly elevated pulmonary artery systolic pressure. The estimated right ventricular systolic pressure is 37.6 mmHg.  3. The mitral valve is normal in structure. No evidence of mitral valve regurgitation. No evidence of mitral stenosis.  4. The aortic valve has an  indeterminant number of cusps. Aortic valve regurgitation is not visualized. No aortic stenosis is present.  5. The inferior vena cava is normal in size with greater than 50% respiratory variability, suggesting right atrial pressure of 3 mmHg. Comparison(s): Prior images reviewed side by side. Changes from prior study are noted. LVEF mildly reduced to 45% now with new RWMA. FINDINGS  Left Ventricle: Left ventricular ejection fraction, by estimation, is 45 to 50%. Left ventricular ejection fraction by 3D volume is 51 %. The left ventricle has mildly decreased function. The left ventricle demonstrates regional wall motion abnormalities. The average left ventricular global longitudinal strain is -13.4 %. Strain was performed and the global longitudinal strain is abnormal. The left ventricular internal cavity size was normal in size. There is no left ventricular hypertrophy. Left ventricular diastolic parameters are consistent with Grade I diastolic dysfunction (impaired relaxation). Normal left ventricular filling pressure.  LV Wall Scoring: The apical lateral segment, mid anterolateral segment, mid inferoseptal segment, apical septal segment, and apex are akinetic. The mid and distal anterior wall, anterior septum, and apical inferior segment are hypokinetic. The inferior wall, posterior wall, basal anterolateral segment, basal anterior segment, and basal inferoseptal segment are normal. Right Ventricle: The right ventricular size is normal. No increase in right ventricular wall thickness. Right ventricular systolic function is normal. There is mildly elevated pulmonary artery systolic pressure. The tricuspid regurgitant velocity is 2.94  m/s, and with an assumed right atrial pressure of 3 mmHg, the estimated right ventricular systolic pressure is 37.6 mmHg. Left Atrium: Left atrial size was normal in size. Right Atrium: Right atrial size was normal in size. Pericardium: There  is no evidence of pericardial effusion.  Mitral Valve: The mitral valve is normal in structure. No evidence of mitral valve regurgitation. No evidence of mitral valve stenosis. Tricuspid Valve: The tricuspid valve is normal in structure. Tricuspid valve regurgitation is mild . No evidence of tricuspid stenosis. Aortic Valve: The aortic valve has an indeterminant number of cusps. Aortic valve regurgitation is not visualized. No aortic stenosis is present. Pulmonic Valve: The pulmonic valve was not well visualized. Pulmonic valve regurgitation is not visualized. No evidence of pulmonic stenosis. Aorta: The aortic root is normal in size and structure. Venous: The inferior vena cava is normal in size with greater than 50% respiratory variability, suggesting right atrial pressure of 3 mmHg. IAS/Shunts: No atrial level shunt detected by color flow Doppler. Additional Comments: 3D was performed not requiring image post processing on an independent workstation and was abnormal.  LEFT VENTRICLE PLAX 2D LVIDd:         4.50 cm         Diastology LVIDs:         2.90 cm         LV e' medial:    6.31 cm/s LV PW:         1.00 cm         LV E/e' medial:  14.4 LV IVS:        0.90 cm         LV e' lateral:   6.42 cm/s LVOT diam:     1.90 cm         LV E/e' lateral: 14.2 LV SV:         49 LV SV Index:   29              2D Longitudinal LVOT Area:     2.84 cm        Strain                                2D Strain GLS   -13.4 %                                Avg:                                 3D Volume EF                                LV 3D EF:    Left                                             ventricul                                             ar                                             ejection  fraction                                             by 3D                                             volume is                                             51 %.                                 3D Volume EF:                                 3D EF:        51 %                                LV EDV:       109 ml                                LV ESV:       53 ml                                LV SV:        56 ml RIGHT VENTRICLE RV S prime:     15.00 cm/s TAPSE (M-mode): 2.2 cm LEFT ATRIUM             Index        RIGHT ATRIUM           Index LA diam:        2.70 cm 1.58 cm/m   RA Area:     13.60 cm LA Vol (A2C):   37.1 ml 21.75 ml/m  RA Volume:   32.20 ml  18.88 ml/m LA Vol (A4C):   45.4 ml 26.62 ml/m LA Biplane Vol: 41.6 ml 24.39 ml/m  AORTIC VALVE LVOT Vmax:   79.30 cm/s LVOT Vmean:  53.300 cm/s LVOT VTI:    0.172 m  AORTA Ao Root diam: 2.90 cm MITRAL VALVE                TRICUSPID VALVE MV Area (PHT): 5.13 cm     TR Peak grad:   34.6 mmHg MV Decel Time: 148 msec     TR Vmax:        294.00 cm/s MV E velocity: 91.10 cm/s MV A velocity: 114.00 cm/s  SHUNTS MV E/A ratio:  0.80         Systemic VTI:  0.17 m                             Systemic Diam: 1.90 cm Vishnu Priya Mallipeddi Electronically signed by  Vishnu Priya Mallipeddi Signature Date/Time: 06/27/2024/10:44:04 AM    Final    Patient Profile   73 y.o. female with a past medical history of hypertension, hyperlipidemia, carotid artery disease, and h/o chest pain (false positive NST in 07/2020 with cath in 10/21 showing normal coronary arteries) who presented to the [facility] on 7/8 after she synopsized. 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 Does not use oxygen at home. Now off oxygen, was no longer hypoxic with ambulation. Hs troponin 588->1116-> 1221 -> 865 ECG showed no evidence of acute ischemic change LHC 7/9 showed mild CAD with no obstructive disease. LVEDP 21 mmHg.  Echocardiogram LVEF 45-50% with mildly decreased function with regional wall motion  abnormalities. Grade I Diastolic Dysfunction.    She reported significant personal life stress with the death of a sister-in-law several weeks ago, a brother-in-law awaiting renal transplant with other medical conditions, and her husband is currently hospitalized at [facility].  She also has been without her Xanax  prescription for the last week and has been experiencing what sounds to be withdrawal symptoms.  Per chart review she does have some mild emphysema changes on a CT Chest in 2024.  CTA 06/28/24 showed no evidence of PE though did reveal new small R>L pleural effusions with likely atelectasis. This in the setting of mild COPD is most likely contributing to her hypoxia. She was given 1 dose of IV lasix  40 mg. Only of urine output charted, though patient reports good urine output.  Ambulating without oxygen this morning. Patient reports doing well, reports significant improvement in breathing.    - coreg  3.125 mg BID, recent addition this admission - irbesartan  75 mg formulary sub for home olmesartan  - can resume home ARB at discharge - hold off on SGLT2i/spironolactone, can reconsider as outpatient - would send home with Lasix  20mg  daily PRN SOB/swelling - patient states was on hydrochlorothiazide  prior to admission, would hold off resuming at DC   Syncope Could be multifactorial.  Patient also describes inadequate eating and hydration prior to episode when she was focused on her husband who is in the hospital. She has a history of several syncopal episodes in the last year, the two prior resulting in hip fractures each. Per telemetry she has remained in sinus rhythm.  Have arranged 2 week live Zio at discharged to be placed before she goes home, with consideration of ILR if this is unrevealing as outpatient. Could also consider OP neuro evaluation if heart monitor is unrevealing. She was instructed on avoidance of driving x 6 months at discharge per [place] DMV recommendations. She is  agreeable to this plan.    Hypertension BP: 128/77 -coreg  3.125 mg BID -irbesartan  75 mg    Hyperlipidemia  LDL 53    HDL 64 Continue Pravastatin  40 mg Continue zetia  10 mg   Mild Carotid Artery Disease Continue to monitor outpatient -ASA 81  Per primary Anxiety/Depression  2 week follow-up scheduled. This will fall before completion of the Zio, but helpful to reassess for post-hospital visit/GDMT going forward. MD still needs to see, but anticipate DC today.    For questions or updates, please contact [facility] Please consult [email or web address] for contact info under     Signed, Leontine LOISE Salen, PA-C  06/29/2024, 9:16 AM

## 2024-06-29 NOTE — Progress Notes (Signed)
 Mobility Specialist Progress Note;   06/29/24 0921  Mobility  Activity Ambulated with assistance in hallway  Level of Assistance Contact guard assist, steadying assist  Assistive Device None  Distance Ambulated (ft) 200 ft  Activity Response Tolerated well  Mobility Referral Yes  Mobility visit 1 Mobility  Mobility Specialist Start Time (ACUTE ONLY) [id number]  Mobility Specialist Stop Time (ACUTE ONLY) 0926  Mobility Specialist Time Calculation (min) (ACUTE ONLY) 5 min   Pt agreeable to mobility. Required light MinG assistance during ambulation for safety. Ambulated on RA, VSS throughout. No c/o SOB when asked. Pt returned back to bed with all needs met, call bell in reach.  Lauraine Erm Mobility Specialist Please contact via SecureChat or [employer] [phone]

## 2024-06-29 NOTE — Telephone Encounter (Signed)
 Pt was discharged from the hospital today and is requesting a refill on her Xanax  Rx until she is able to come in on 7/18 and see PCP for hospital follow up.   Pt says she is completely out which is one of the reasons that put her in the hospital because she stopped taking them cold [place].   Please advise and let patient know today before we close for the weekend.

## 2024-06-29 NOTE — Telephone Encounter (Signed)
 Pt made aware and understood. She is scheduled for next week. Will watch for cancellations to see if we can get her in sooner. Pt has no further concerns.

## 2024-06-30 DIAGNOSIS — R55 Syncope and collapse: Secondary | ICD-10-CM | POA: Diagnosis not present

## 2024-07-02 ENCOUNTER — Ambulatory Visit: Admitting: Nurse Practitioner

## 2024-07-02 ENCOUNTER — Telehealth: Payer: Self-pay | Admitting: Family Medicine

## 2024-07-02 NOTE — Telephone Encounter (Signed)
 Copied from CRM [id number]. Topic: Clinical - Medical Advice >> Jun 29, 2024 11:06 AM Nathanel BROCKS wrote: Reason for CRM:   Ref:ALPRAZolam  (XANAX ) 0.5 MG tablet 'pt is in the hospital ([facility]) and she has been without this medication and she is  in need of it. The provider at the hosp advised pt to call and see if Dr Dettinger would refill her medication as she is experiencing withdrawals. Please advise.

## 2024-07-02 NOTE — Telephone Encounter (Signed)
 Needs to be seen, looks like she had to prescribe somewhere else on the last time

## 2024-07-02 NOTE — Telephone Encounter (Signed)
 Left detailed message per signed DPR. Encouraged call back or send us  a Mychart message if there are any questions. I explained  that the appointment she has scheduled with Dr. Maryanne will be the best opportunity to discuss anxiety medication.

## 2024-07-03 ENCOUNTER — Other Ambulatory Visit: Payer: Self-pay | Admitting: *Deleted

## 2024-07-03 DIAGNOSIS — E538 Deficiency of other specified B group vitamins: Secondary | ICD-10-CM

## 2024-07-03 DIAGNOSIS — E559 Vitamin D deficiency, unspecified: Secondary | ICD-10-CM

## 2024-07-03 DIAGNOSIS — D751 Secondary polycythemia: Secondary | ICD-10-CM

## 2024-07-03 DIAGNOSIS — R7989 Other specified abnormal findings of blood chemistry: Secondary | ICD-10-CM

## 2024-07-03 DIAGNOSIS — D509 Iron deficiency anemia, unspecified: Secondary | ICD-10-CM

## 2024-07-06 ENCOUNTER — Other Ambulatory Visit: Payer: Self-pay

## 2024-07-06 ENCOUNTER — Encounter: Payer: Self-pay | Admitting: Family Medicine

## 2024-07-06 ENCOUNTER — Ambulatory Visit (INDEPENDENT_AMBULATORY_CARE_PROVIDER_SITE_OTHER): Admitting: Family Medicine

## 2024-07-06 VITALS — BP 150/56 | HR 56 | Ht 65.0 in | Wt 138.0 lb

## 2024-07-06 DIAGNOSIS — Z79899 Other long term (current) drug therapy: Secondary | ICD-10-CM

## 2024-07-06 DIAGNOSIS — R7989 Other specified abnormal findings of blood chemistry: Secondary | ICD-10-CM

## 2024-07-06 DIAGNOSIS — D509 Iron deficiency anemia, unspecified: Secondary | ICD-10-CM | POA: Diagnosis not present

## 2024-07-06 DIAGNOSIS — F419 Anxiety disorder, unspecified: Secondary | ICD-10-CM

## 2024-07-06 DIAGNOSIS — E538 Deficiency of other specified B group vitamins: Secondary | ICD-10-CM | POA: Diagnosis not present

## 2024-07-06 DIAGNOSIS — I5181 Takotsubo syndrome: Secondary | ICD-10-CM | POA: Diagnosis not present

## 2024-07-06 DIAGNOSIS — F32A Depression, unspecified: Secondary | ICD-10-CM | POA: Diagnosis not present

## 2024-07-06 DIAGNOSIS — D751 Secondary polycythemia: Secondary | ICD-10-CM | POA: Diagnosis not present

## 2024-07-06 DIAGNOSIS — E559 Vitamin D deficiency, unspecified: Secondary | ICD-10-CM | POA: Diagnosis not present

## 2024-07-06 MED ORDER — ALPRAZOLAM 0.5 MG PO TABS: 0.5000 mg | ORAL_TABLET | Freq: Two times a day (BID) | ORAL | 2 refills | Status: DC | PRN

## 2024-07-06 NOTE — Progress Notes (Signed)
 BP (!) 150/56   Pulse (!) 56   Ht 5' 5 (1.651 m)   Wt 138 lb (62.6 kg)   SpO2 96%   BMI 22.96 kg/m    Subjective:   Patient ID: Jody Wilson, female    DOB: 02/10/1951, 73 y.o.   MRN: [id number]  HPI: Jody Wilson is a 73 y.o. female presenting on 07/06/2024 for Hospitalization Follow-up and Anxiety   HPI Hospital follow-up Patient is coming in today for hospital follow-up and was in the hospital from 06/26/2024 until 06/29/2024.  She presented the hospital with syncope and then had elevated troponins and had cardiac workup.  Echocardiogram showed 45% ejection fraction and new wall motion abnormalities.  She had a cardiac catheterization that did not show any clear obstruction.  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  She was also found to have acute hypoxic respiratory failure which was resolved upon discharge.  She had a CT for PE and it was negative.  Since leaving the hospital she has not had any further syncope or chest pain or palpitations or shortness of breath.  Relevant past medical, surgical, family and social history reviewed and updated as indicated. Interim medical history since our last visit reviewed. Allergies and medications reviewed and updated.  Review of Systems  Constitutional:  Negative for chills and fever.  HENT:  Negative for ear pain.   Eyes:  Negative for visual disturbance.  Respiratory:  Negative for chest tightness and shortness of breath.   Cardiovascular:  Negative for chest pain and leg swelling.  Genitourinary:  Negative for difficulty urinating and dysuria.  Skin:  Negative for rash.  Neurological:  Negative for dizziness, light-headedness and headaches.  Psychiatric/Behavioral:  Positive for dysphoric mood. Negative for agitation, behavioral problems, self-injury and sleep disturbance. The patient is nervous/anxious.   All other systems reviewed and are negative.   Per HPI unless specifically indicated  above   Allergies as of 07/06/2024       Reactions   Tetracyclines & Related Itching, Swelling        Medication List        Accurate as of July 06, 2024 12:07 PM. If you have any questions, ask your nurse or doctor.          ALPRAZolam  0.5 MG tablet Commonly known as: XANAX  Take 1 tablet (0.5 mg total) by mouth 2 (two) times daily as needed for anxiety.   aspirin  EC 81 MG tablet TAKE 1 PO BID THRU 05/13/24 THEN RESUME 1 PO DAILY   buPROPion  150 MG 24 hr tablet Commonly known as: Wellbutrin  XL Take 1 tablet (150 mg total) by mouth daily.   carvedilol  3.125 MG tablet Commonly known as: COREG  Take 1 tablet (3.125 mg total) by mouth 2 (two) times daily with a meal.   citalopram  40 MG tablet Commonly known as: CELEXA  Take 1 tablet (40 mg total) by mouth daily.   cyanocobalamin  1000 MCG/ML injection Commonly known as: VITAMIN B12 INJECT 1 ML IM EVERY 28 DAYS   docusate sodium  100 MG capsule Commonly known as: COLACE Take 1 capsule (100 mg total) by mouth 2 (two) times daily.   ergocalciferol  1.25 MG (50000 UT) capsule Commonly known as: VITAMIN D2 Take 1 capsule (50,000 Units total) by mouth every 14 (fourteen) days.   ezetimibe  10 MG tablet Commonly known as: ZETIA  Take 10 mg by mouth daily.   feeding supplement Liqd Take 237 mLs by mouth 2 (two) times daily between meals.  folic acid  1 MG tablet Commonly known as: FOLVITE  Take 1 mg by mouth every other day.   furosemide  20 MG tablet Commonly known as: Lasix  Take 1 tablet (20 mg total) by mouth daily as needed (for swelling/shortness of breath).   methocarbamol  500 MG tablet Commonly known as: ROBAXIN  Take 1 tablet (500 mg total) by mouth 3 (three) times daily.   multivitamin with minerals Tabs tablet Take 1 tablet by mouth daily.   olmesartan  20 MG tablet Commonly known as: Benicar  Take 0.5 tablets (10 mg total) by mouth daily.   ondansetron  4 MG tablet Commonly known as: ZOFRAN  Take 1  tablet (4 mg total) by mouth every 8 (eight) hours as needed for nausea or vomiting.   pantoprazole  40 MG tablet Commonly known as: PROTONIX  Take 1 tablet (40 mg total) by mouth 2 (two) times daily before a meal.   polyethylene glycol 17 g packet Commonly known as: MIRALAX  / GLYCOLAX  Take 17 g by mouth daily as needed for mild constipation.   pravastatin  40 MG tablet Commonly known as: PRAVACHOL  Take 1 tablet (40 mg total) by mouth every evening.         Objective:   BP (!) 150/56   Pulse (!) 56   Ht 5' 5 (1.651 m)   Wt 138 lb (62.6 kg)   SpO2 96%   BMI 22.96 kg/m   Wt Readings from Last 3 Encounters:  07/06/24 138 lb (62.6 kg)  06/27/24 142 lb 10.2 oz (64.7 kg)  05/23/24 140 lb (63.5 kg)    Physical Exam Vitals and nursing note reviewed.  Constitutional:      General: She is not in acute distress.    Appearance: She is well-developed. She is not diaphoretic.  Eyes:     Conjunctiva/sclera: Conjunctivae normal.  Cardiovascular:     Rate and Rhythm: Normal rate and regular rhythm.     Heart sounds: Normal heart sounds. No murmur heard. Pulmonary:     Effort: Pulmonary effort is normal. No respiratory distress.     Breath sounds: Normal breath sounds. No wheezing or rhonchi.  Musculoskeletal:        General: No tenderness. Normal range of motion.  Skin:    General: Skin is warm and dry.     Findings: No rash.  Neurological:     Mental Status: She is alert and oriented to person, place, and time.     Coordination: Coordination normal.  Psychiatric:        Behavior: Behavior normal.       Assessment & Plan:   Problem List Items Addressed This Visit       Other   Anxiety and depression   Relevant Medications   ALPRAZolam  (XANAX ) 0.5 MG tablet   Other Visit Diagnoses       Takotsubo cardiomyopathy    -  Primary   Relevant Orders   CBC with Differential/Platelet   CMP14+EGFR       She seems to be doing well, she wears a cardiac monitor and  following up with cardiology as well. Follow up plan: Return in about 3 months (around 10/06/2024), or if symptoms worsen or fail to improve, for Depression recheck.  Counseling provided for all of the vaccine components Orders Placed This Encounter  Procedures   CBC with Differential/Platelet   CMP14+EGFR    Fonda Levins, MD [facility] 07/06/2024, 12:07 PM

## 2024-07-07 LAB — CMP14+EGFR
ALT: 10 IU/L (ref 0–32)
AST: 15 IU/L (ref 0–40)
Albumin: 4.2 g/dL (ref 3.8–4.8)
Alkaline Phosphatase: 80 IU/L (ref 44–121)
BUN/Creatinine Ratio: 11 — ABNORMAL LOW (ref 12–28)
BUN: 10 mg/dL (ref 8–27)
Bilirubin Total: 0.6 mg/dL (ref 0.0–1.2)
CO2: 25 mmol/L (ref 20–29)
Calcium: 10.1 mg/dL (ref 8.7–10.3)
Chloride: 99 mmol/L (ref 96–106)
Creatinine, Ser: 0.95 mg/dL (ref 0.57–1.00)
Globulin, Total: 3.2 g/dL (ref 1.5–4.5)
Glucose: 107 mg/dL — ABNORMAL HIGH (ref 70–99)
Potassium: 4.3 mmol/L (ref 3.5–5.2)
Sodium: 141 mmol/L (ref 134–144)
Total Protein: 7.4 g/dL (ref 6.0–8.5)
eGFR: 64 mL/min/1.73 (ref >59)

## 2024-07-07 LAB — CBC WITH DIFFERENTIAL/PLATELET
Basophils Absolute: 0 x10E3/uL (ref 0.0–0.2)
Basos: 1 %
EOS (ABSOLUTE): 0.1 x10E3/uL (ref 0.0–0.4)
Eos: 2 %
Hematocrit: 44 % (ref 34.0–46.6)
Hemoglobin: 14.2 g/dL (ref 11.1–15.9)
Immature Grans (Abs): 0 x10E3/uL (ref 0.0–0.1)
Immature Granulocytes: 0 %
Lymphocytes Absolute: 1.9 x10E3/uL (ref 0.7–3.1)
Lymphs: 32 %
MCH: 31.9 pg (ref 26.6–33.0)
MCHC: 32.3 g/dL (ref 31.5–35.7)
MCV: 99 fL — ABNORMAL HIGH (ref 79–97)
Monocytes Absolute: 0.5 x10E3/uL (ref 0.1–0.9)
Monocytes: 8 %
Neutrophils Absolute: 3.4 x10E3/uL (ref 1.4–7.0)
Neutrophils: 57 %
Platelets: 343 x10E3/uL (ref 150–450)
RBC: 4.45 x10E6/uL (ref 3.77–5.28)
RDW: 12.5 % (ref 11.7–15.4)
WBC: 6 x10E3/uL (ref 3.4–10.8)

## 2024-07-09 ENCOUNTER — Other Ambulatory Visit (INDEPENDENT_AMBULATORY_CARE_PROVIDER_SITE_OTHER): Payer: Self-pay

## 2024-07-09 ENCOUNTER — Inpatient Hospital Stay: Payer: Medicare Other | Admitting: Physician Assistant

## 2024-07-09 ENCOUNTER — Ambulatory Visit: Admitting: Orthopedic Surgery

## 2024-07-09 DIAGNOSIS — S72002D Fracture of unspecified part of neck of left femur, subsequent encounter for closed fracture with routine healing: Secondary | ICD-10-CM

## 2024-07-09 DIAGNOSIS — W19XXXA Unspecified fall, initial encounter: Secondary | ICD-10-CM | POA: Diagnosis not present

## 2024-07-09 LAB — IRON AND TIBC
Iron Saturation: 17 % (ref 15–55)
Iron: 64 ug/dL (ref 27–139)
Total Iron Binding Capacity: 372 ug/dL (ref 250–450)
UIBC: 308 ug/dL (ref 118–369)

## 2024-07-09 LAB — COMPREHENSIVE METABOLIC PANEL WITH GFR
ALT: 10 IU/L (ref 0–32)
AST: 16 IU/L (ref 0–40)
Albumin: 4.3 g/dL (ref 3.8–4.8)
Alkaline Phosphatase: 81 IU/L (ref 44–121)
BUN/Creatinine Ratio: 13 (ref 12–28)
BUN: 11 mg/dL (ref 8–27)
Bilirubin Total: 0.6 mg/dL (ref 0.0–1.2)
CO2: 23 mmol/L (ref 20–29)
Calcium: 10.1 mg/dL (ref 8.7–10.3)
Chloride: 98 mmol/L (ref 96–106)
Creatinine, Ser: 0.88 mg/dL (ref 0.57–1.00)
Globulin, Total: 3.2 g/dL (ref 1.5–4.5)
Glucose: 103 mg/dL — ABNORMAL HIGH (ref 70–99)
Potassium: 4 mmol/L (ref 3.5–5.2)
Sodium: 140 mmol/L (ref 134–144)
Total Protein: 7.5 g/dL (ref 6.0–8.5)
eGFR: 70 mL/min/1.73 (ref >59)

## 2024-07-09 LAB — FERRITIN: Ferritin: 26 ng/mL (ref 15–150)

## 2024-07-09 LAB — CBC WITH DIFFERENTIAL/PLATELET
Basophils Absolute: 0 x10E3/uL (ref 0.0–0.2)
Basos: 1 %
EOS (ABSOLUTE): 0.1 x10E3/uL (ref 0.0–0.4)
Eos: 2 %
Hematocrit: 44 % (ref 34.0–46.6)
Hemoglobin: 14 g/dL (ref 11.1–15.9)
Immature Grans (Abs): 0 x10E3/uL (ref 0.0–0.1)
Immature Granulocytes: 0 %
Lymphocytes Absolute: 1.7 x10E3/uL (ref 0.7–3.1)
Lymphs: 32 %
MCH: 31.3 pg (ref 26.6–33.0)
MCHC: 31.8 g/dL (ref 31.5–35.7)
MCV: 98 fL — ABNORMAL HIGH (ref 79–97)
Monocytes Absolute: 0.4 x10E3/uL (ref 0.1–0.9)
Monocytes: 7 %
Neutrophils Absolute: 3.1 x10E3/uL (ref 1.4–7.0)
Neutrophils: 58 %
Platelets: 336 x10E3/uL (ref 150–450)
RBC: 4.47 x10E6/uL (ref 3.77–5.28)
RDW: 12.5 % (ref 11.7–15.4)
WBC: 5.3 x10E3/uL (ref 3.4–10.8)

## 2024-07-09 LAB — FOLATE: Folate: 12.3 ng/mL (ref >3.0)

## 2024-07-09 LAB — VITAMIN D 25 HYDROXY (VIT D DEFICIENCY, FRACTURES): Vit D, 25-Hydroxy: 62.2 ng/mL (ref 30.0–100.0)

## 2024-07-09 LAB — METHYLMALONIC ACID, SERUM: Methylmalonic Acid: 168 nmol/L (ref 0–378)

## 2024-07-09 LAB — VITAMIN B12: Vitamin B-12: 391 pg/mL (ref 232–1245)

## 2024-07-09 NOTE — Progress Notes (Signed)
   There were no vitals taken for this visit.  There is no height or weight on file to calculate BMI.  Chief Complaint  Patient presents with   Routine Post Op    LEFT THA DOS 04/13/24    Encounter Diagnoses  Name Primary?   Closed fracture of left hip requiring operative repair with routine healing, subsequent encounter Yes   Fall, initial encounter     DOI/DOS/ Date: 04/13/24  blacked out and fell 06/26/24 woke in [facility] [place] wearing heart monitor

## 2024-07-09 NOTE — Progress Notes (Signed)
   There were no vitals taken for this visit.  There is no height or weight on file to calculate BMI.  Chief Complaint  Patient presents with   Routine Post Op    LEFT THA DOS 04/13/24    Encounter Diagnoses  Name Primary?   Closed fracture of left hip requiring operative repair with routine healing, subsequent encounter Yes   Fall, initial encounter     DOI/DOS/ Date: 04/13/24  blacked out and fell 06/26/24 woke in [facility] [place] wearing heart monitor   Cardiac workup really was negative for any serious cardiac issue but she was placed with a heart monitor and then her husband had a similar thing happened to him his was related to COPD and CHF.  So they both have a heart monitor  The patient walks gingerly but walks well even without her cane but I suggest that she use it and go back to therapy for further strengthening and conditioning  Follow-up 1 month  DG HIP UNILAT WITH PELVIS 2-3 VIEWS LEFT Result Date: 07/09/2024 X-ray report Chief complaint May 17, 2024 left hip fracture left hip bipolar Images AP pelvis AP lateral hip Reading: Both hips look stable Impression: Stable bipolar replacement left hip no sign of problems with the previously placed right

## 2024-07-10 ENCOUNTER — Telehealth: Payer: Self-pay

## 2024-07-10 ENCOUNTER — Other Ambulatory Visit: Payer: Self-pay | Admitting: *Deleted

## 2024-07-10 ENCOUNTER — Inpatient Hospital Stay: Attending: Oncology | Admitting: Oncology

## 2024-07-10 VITALS — BP 147/66 | HR 57 | Temp 98.0°F | Resp 20

## 2024-07-10 DIAGNOSIS — Z87891 Personal history of nicotine dependence: Secondary | ICD-10-CM

## 2024-07-10 DIAGNOSIS — R1110 Vomiting, unspecified: Secondary | ICD-10-CM | POA: Diagnosis not present

## 2024-07-10 DIAGNOSIS — F17200 Nicotine dependence, unspecified, uncomplicated: Secondary | ICD-10-CM | POA: Diagnosis not present

## 2024-07-10 DIAGNOSIS — R55 Syncope and collapse: Secondary | ICD-10-CM | POA: Insufficient documentation

## 2024-07-10 DIAGNOSIS — R7989 Other specified abnormal findings of blood chemistry: Secondary | ICD-10-CM

## 2024-07-10 DIAGNOSIS — D509 Iron deficiency anemia, unspecified: Secondary | ICD-10-CM | POA: Insufficient documentation

## 2024-07-10 DIAGNOSIS — E559 Vitamin D deficiency, unspecified: Secondary | ICD-10-CM | POA: Diagnosis not present

## 2024-07-10 DIAGNOSIS — F32A Depression, unspecified: Secondary | ICD-10-CM | POA: Diagnosis not present

## 2024-07-10 DIAGNOSIS — Z79899 Other long term (current) drug therapy: Secondary | ICD-10-CM | POA: Insufficient documentation

## 2024-07-10 DIAGNOSIS — E538 Deficiency of other specified B group vitamins: Secondary | ICD-10-CM | POA: Diagnosis not present

## 2024-07-10 DIAGNOSIS — R5383 Other fatigue: Secondary | ICD-10-CM | POA: Insufficient documentation

## 2024-07-10 DIAGNOSIS — R42 Dizziness and giddiness: Secondary | ICD-10-CM | POA: Diagnosis not present

## 2024-07-10 DIAGNOSIS — E785 Hyperlipidemia, unspecified: Secondary | ICD-10-CM | POA: Diagnosis not present

## 2024-07-10 DIAGNOSIS — D751 Secondary polycythemia: Secondary | ICD-10-CM

## 2024-07-10 DIAGNOSIS — I252 Old myocardial infarction: Secondary | ICD-10-CM | POA: Diagnosis not present

## 2024-07-10 DIAGNOSIS — I10 Essential (primary) hypertension: Secondary | ICD-10-CM | POA: Insufficient documentation

## 2024-07-10 MED ORDER — ERGOCALCIFEROL 1.25 MG (50000 UT) PO CAPS: 50000.0000 [IU] | ORAL_CAPSULE | ORAL | 1 refills | Status: DC

## 2024-07-10 NOTE — Telephone Encounter (Signed)
 Looks like oncology called this patient not this office will route back to the appropriate office.

## 2024-07-10 NOTE — Assessment & Plan Note (Addendum)
 Recommend low-dose CT screening program.  Orders are placed.  Will reschedule.

## 2024-07-10 NOTE — Assessment & Plan Note (Signed)
 Unclear etiology but she has fallen on several occasions. Most recently she was evaluated in the emergency room after she blacked out while in her yard. Workup showed most likely stress cardiomyopathy cardiac cath without clear obstruction. She has not had any additional falls since recent hospital stay.

## 2024-07-10 NOTE — Progress Notes (Signed)
 [name] OFFICE PROGRESS NOTE  Dettinger, Fonda LABOR, MD  ASSESSMENT & PLAN:  Assessment & Plan Vitamin D  deficiency Continue every other week vitamin D  50,000 units. Iron deficiency anemia, unspecified iron deficiency anemia type Reviewed iron levels.  Iron saturations have dropped to 17%.  Ferritin is 26. She last received IV Feraheme  back in 2020 and 03/17/2020. We discussed giving her 2 doses of IV Feraheme  to see if this helps her energy levels and fatigue. Hemoglobin is stable at 14. Return to clinic in 3 months or so for labs and virtual visit. Low vitamin B12 level B12 level 391.  This is now normal. Folic acid  deficiency Folate level 12.3.  This is now WNL. Syncope, unspecified syncope type Unclear etiology but she has fallen on several occasions. Most recently she was evaluated in the emergency room after she blacked out while in her yard. Workup showed most likely stress cardiomyopathy cardiac cath without clear obstruction. She has not had any additional falls since recent hospital stay. Smoking history Recommend low-dose CT screening program.  Orders are placed.  Will reschedule.  Orders Placed This Encounter  Procedures   Iron and TIBC    Standing Status:   Future    Expected Date:   10/10/2024    Expiration Date:   01/08/2025   Ferritin    Standing Status:   Future    Expected Date:   10/10/2024    Expiration Date:   01/08/2025    Release to patient:   Immediate   Vitamin B12    Standing Status:   Future    Expected Date:   10/10/2024    Expiration Date:   01/08/2025    Release to patient:   Immediate   CBC with Differential/Platelet    Standing Status:   Future    Expected Date:   10/10/2024    Expiration Date:   01/08/2025    Release to patient:   Immediate    INTERVAL HISTORY: Patient returns for erythrocytosis, iron deficiency, low B12 levels and most recently syncopal episodes.  Was recently hospitalized for syncope.  Workup was  essentially unremarkable and thought to be due to stress cardiomyopathy.  Testing at [facility] was essentially negative.  Reports she has fallen on several occasions last couple of years.  She broke her right hip back in 2024 and her left hip earlier this year.  Reports symptoms of severe fatigue and weakness.  She has no energy.  Anxiety and depression that is chronic and stable.  Has had several dizzy spells.  She is unable to tolerate iron supplements orally.   She qualifies for low-dose CT screening but has not had this done yet.  She continues to smoke.  She takes folic acid  and B12 shots.  She is taking vitamin D  50,000 units weekly.  We reviewed iron panel, vitamin B12, vitamin D , folate, MMA, CMP and CBC.  SUMMARY OF HEMATOLOGIC HISTORY: Patient is a 73 year old female with history significant for hypertension, hyperlipidemia, anxiety and depression, syncope, acute respiratory failure with hypoxia, non-STEMI.  She has received IV iron and B12 intermittently.     No results found for: CBC  Vitals:   07/10/24 0957 07/10/24 1000  BP: (!) 147/69 (!) 147/66  Pulse: (!) 57   Resp: 20   Temp: 98 F (36.7 C)   SpO2: 99%    CBC    Component Value Date/Time   WBC 5.3 07/06/2024 1227   WBC 6.9 06/29/2024 0428  RBC 4.47 07/06/2024 1227   RBC 4.34 06/29/2024 0428   HGB 14.0 07/06/2024 1227   HCT 44.0 07/06/2024 1227   PLT 336 07/06/2024 1227   MCV 98 (H) 07/06/2024 1227   MCH 31.3 07/06/2024 1227   MCH 32.5 06/29/2024 0428   MCHC 31.8 07/06/2024 1227   MCHC 33.8 06/29/2024 0428   RDW 12.5 07/06/2024 1227   LYMPHSABS 1.7 07/06/2024 1227   MONOABS 0.3 06/26/2024 2009   EOSABS 0.1 07/06/2024 1227   BASOSABS 0.0 07/06/2024 1227   Review of Systems  Constitutional:  Positive for malaise/fatigue.  Gastrointestinal:  Positive for vomiting.  Neurological:  Positive for dizziness.  Psychiatric/Behavioral:  Positive for depression. The patient is nervous/anxious.     Physical Exam Constitutional:      Appearance: Normal appearance.  Cardiovascular:     Rate and Rhythm: Normal rate and regular rhythm.  Pulmonary:     Effort: Pulmonary effort is normal.     Breath sounds: Normal breath sounds.  Abdominal:     General: Bowel sounds are normal.     Palpations: Abdomen is soft.  Musculoskeletal:        General: No swelling. Normal range of motion.  Neurological:     Mental Status: She is alert and oriented to person, place, and time. Mental status is at baseline.      I spent 30 minutes dedicated to the care of this patient (face-to-face and non-face-to-face) on the date of the encounter to include what is described in the assessment and plan.,  Delon Hope, NP 07/10/2024 3:20 PM

## 2024-07-10 NOTE — Assessment & Plan Note (Signed)
 Reviewed iron levels.  Iron saturations have dropped to 17%.  Ferritin is 26. She last received IV Feraheme  back in 2020 and 03/17/2020. We discussed giving her 2 doses of IV Feraheme  to see if this helps her energy levels and fatigue. Hemoglobin is stable at 14. Return to clinic in 3 months or so for labs and virtual visit.

## 2024-07-10 NOTE — Telephone Encounter (Signed)
 Copied from CRM [id number]. Topic: General - Other >> Jul 10, 2024  3:13 PM Emylou G wrote: Reason for CRM: Patient called. Returned your call - said already on B12 and Vit D.. but unsure why we called.. Pls call her back

## 2024-07-11 ENCOUNTER — Ambulatory Visit: Payer: Self-pay | Admitting: Family Medicine

## 2024-07-11 LAB — TOXASSURE SELECT 13 (MW), URINE

## 2024-07-12 ENCOUNTER — Ambulatory Visit: Payer: Self-pay | Admitting: Family Medicine

## 2024-07-12 ENCOUNTER — Encounter: Payer: Self-pay | Admitting: *Deleted

## 2024-07-12 ENCOUNTER — Ambulatory Visit

## 2024-07-12 DIAGNOSIS — M25552 Pain in left hip: Secondary | ICD-10-CM | POA: Diagnosis not present

## 2024-07-12 DIAGNOSIS — M6281 Muscle weakness (generalized): Secondary | ICD-10-CM

## 2024-07-12 DIAGNOSIS — R262 Difficulty in walking, not elsewhere classified: Secondary | ICD-10-CM | POA: Diagnosis not present

## 2024-07-12 DIAGNOSIS — M25652 Stiffness of left hip, not elsewhere classified: Secondary | ICD-10-CM

## 2024-07-12 NOTE — Therapy (Signed)
 OUTPATIENT PHYSICAL THERAPY LOWER EXTREMITY TREATMENT   Patient Name: Jody Wilson MRN: [id number] DOB:06-30-1951, 73 y.o., female Today's Date: 07/12/2024  END OF SESSION:  PT End of Session - 07/12/24 1304     Visit Number 9    Number of Visits 12    Date for PT Re-Evaluation 08/17/24    PT Start Time 1300    PT Stop Time 1401    PT Time Calculation (min) 61 min    Activity Tolerance Patient tolerated treatment well    Behavior During Therapy WFL for tasks assessed/performed           Past Medical History:  Diagnosis Date   Anemia    Anxiety    Aortic atherosclerosis (HCC)    Carotid artery disease (HCC)    Depression    GERD (gastroesophageal reflux disease)    Hyperlipidemia    Hypertension    Orthostatic hypotension    Osteoporosis    Past Surgical History:  Procedure Laterality Date   ABDOMINAL HYSTERECTOMY     BACK SURGERY     CHOLECYSTECTOMY     FOOT SURGERY     7 in the past   HIP ARTHROPLASTY Right 02/11/2023   Procedure: ARTHROPLASTY BIPOLAR HIP (HEMIARTHROPLASTY);  Surgeon: Onesimo Oneil LABOR, MD;  Location: [facility];  Service: Orthopedics;  Laterality: Right;   HIP ARTHROPLASTY Left 04/13/2024   Procedure: HEMIARTHROPLASTY (BIPOLAR) HIP, DIRECT LATERAL  FOR FRACTURE;  Surgeon: Margrette Taft BRAVO, MD;  Location: [facility];  Service: Orthopedics;  Laterality: Left;   IR GENERIC HISTORICAL  12/09/2016   IR RADIOLOGIST EVAL & MGMT 12/09/2016 [facility]   IR GENERIC HISTORICAL  12/10/2016   IR VERTEBROPLASTY CERV/THOR BX INC UNI/BIL INC/INJECT/IMAGING 12/10/2016 [facility]   LEFT HEART CATH AND CORONARY ANGIOGRAPHY N/A 09/30/2020   Procedure: LEFT HEART CATH AND CORONARY ANGIOGRAPHY;  Surgeon: Elmira Newman PARAS, MD;  Location: [facility];  Service: Cardiovascular;  Laterality: N/A;   LEFT HEART CATH AND CORONARY ANGIOGRAPHY N/A 06/27/2024   Procedure: LEFT HEART CATH AND CORONARY ANGIOGRAPHY;  Surgeon: Verlin Lonni BIRCH, MD;  Location: [facility];  Service: Cardiovascular;  Laterality: N/A;   TRACHEOSTOMY TUBE PLACEMENT  1982   Patient Active Problem List   Diagnosis Date Noted   NSTEMI (non-ST elevated myocardial infarction) (HCC) 06/29/2024   Syncope 06/27/2024   Elevated troponin 06/27/2024   Acute respiratory failure with hypoxia (HCC) 06/27/2024   Abnormal CT of liver 06/26/2024   Incontinence of feces 05/08/2024   Closed fracture of neck of left femur (HCC) 04/13/2024   Hypokalemia 04/13/2024   Closed hip fracture requiring operative repair with routine healing 02/09/2023   Pulmonary nodule 02/09/2023   Hypertriglyceridemia 01/22/2022   Abnormal findings on diagnostic imaging of liver and biliary tract 12/09/2021   Diarrhea 12/09/2021   History of colonic polyps 12/09/2021   Anxiety and depression 06/23/2020   Hiatal hernia 06/23/2020   Thoracic aortic atherosclerosis (HCC) 06/23/2020   Smoking history 04/25/2020   Involuntary muscle jerks while sleeping 09/26/2019   B12 deficiency 09/25/2019   Iron deficiency anemia 09/25/2019   Elevated liver function tests 09/07/2019   Osteoporosis 02/09/2017   History of vertebral fracture 02/09/2017   GERD (gastroesophageal reflux disease) 03/11/2016   Hyperlipidemia with target LDL less than 100 02/19/2015   Hypertension 03/18/2014    PCP: Dettinger, Fonda LABOR, MD  REFERRING PROVIDER: Margrette Taft BRAVO, MD   REFERRING DIAG: Closed fracture of left hip requiring operative repair  with routine healing, subsequent encounter   THERAPY DIAG:  Pain in left hip  Stiffness of left hip, not elsewhere classified  Muscle weakness (generalized)  Difficulty in walking, not elsewhere classified  Rationale for Evaluation and Treatment: Rehabilitation  ONSET DATE: 04/13/24  SUBJECTIVE:   SUBJECTIVE STATEMENT: Patient reports 3/10 left hip pain today.    PERTINENT HISTORY: Hypertension, osteoporosis, anxiety, and depression PAIN:  Are you having pain?  Yes: NPRS scale: 3/10 Pain location: left hip  PRECAUTIONS: Fall  RED FLAGS: None   WEIGHT BEARING RESTRICTIONS: No  FALLS:  Has patient fallen in last 6 months? Yes. Number of falls 2  LIVING ENVIRONMENT: Lives with: lives with their spouse Lives in: House/apartment Stairs: Yes: External: 2 steps; none; step to pattern Has following equipment at home: Single point cane  OCCUPATION: retired  PLOF: Independent  PATIENT GOALS: improved mobility, be able to get into and out of her car, and be able to walk without a cane  NEXT MD VISIT: 06/28/24  OBJECTIVE:  Note: Objective measures were completed at Evaluation unless otherwise noted.  DIAGNOSTIC FINDINGS: 05/17/24 L hip x-ray Impression stable implant left hip status post bipolar replacement for left hip femoral neck fracture  PATIENT SURVEYS:  LEFS  Extreme difficulty/unable (0), Quite a bit of difficulty (1), Moderate difficulty (2), Little difficulty (3), No difficulty (4) Survey date:  05/24/24  Any of your usual work, housework or school activities 2  2. Usual hobbies, recreational or sporting activities 1  3. Getting into/out of the bath 1  4. Walking between rooms 2  5. Putting on socks/shoes 3  6. Squatting  1  7. Lifting an object, like a bag of groceries from the floor 1  8. Performing light activities around your home 2  9. Performing heavy activities around your home 1  10. Getting into/out of a car 1  11. Walking 2 blocks 1  12. Walking 1 mile 1  13. Going up/down 10 stairs (1 flight) 1  14. Standing for 1 hour 1  15.  sitting for 1 hour 3  16. Running on even ground 1  17. Running on uneven ground 1  18. Making sharp turns while running fast 1  19. Hopping  1  20. Rolling over in bed 1  Score total:  27/80     COGNITION: Overall cognitive status: Within functional limits for tasks assessed     SENSATION: Patient reports no numbness or tingling.   PALPATION: TTP: left hip flexors, TFL, IT band,  quadriceps, and gluteals  LOWER EXTREMITY ROM:  Active ROM Right eval Left eval  Hip flexion 93 44; limited by pain  Hip extension    Hip abduction    Hip adduction    Hip internal rotation    Hip external rotation    Knee flexion    Knee extension    Ankle dorsiflexion    Ankle plantarflexion    Ankle inversion    Ankle eversion     (Blank rows = not tested)  LOWER EXTREMITY MMT:  MMT Right eval Left eval  Hip flexion 3+/5 3/5; familiar pain  Hip extension    Hip abduction    Hip adduction    Hip internal rotation    Hip external rotation    Knee flexion 4/5 3/5  Knee extension 4+/5 3+/5; limited by familiar pain  Ankle dorsiflexion    Ankle plantarflexion    Ankle inversion    Ankle eversion     (Blank  rows = not tested)  LOWER EXTREMITY SPECIAL TESTS:  Not tested due to surgical condition  GAIT: Assistive device utilized: Single point cane Level of assistance: Modified independence Comments: antalgic gait pattern with left hip external rotation                                                                                                                                TREATMENT DATE:      07/12/24                                 EXERCISE LOG  Exercise Repetitions and Resistance Comments  Nustep Lvl 3 x 15 mins   Rockerboard 5 mins   Lunges 14 box x 3 mins each   Forward Step Ups 4 box x 20 reps bil   LAQs 2# x 20 reps bil   Seated Marches 2# x 20 reps bil   Seated Ham Curls Red x 20 reps bil    Blank cell = exercise not performed today   Manual Therapy Soft Tissue Mobilization: left quad and IT band, STW/M to left quad and IT band to decrease pain and tone with pt in supine for comfort   06/21/24     EXERCISE LOG  Exercise Repetitions and Resistance Comments  Nustep  L5 x 16 minutes   Cybex Knee Flexion 30# x 4 mins   Cybex Knee Extension 10# x 4 mins   Leg Press 2 plates; seat 7; 4 mins   STS 5 reps , no UE support   Stairs 4 step x 3  reps   Squatting   BUE support   Standing hip ABD     Lower trunk rotation    Bridge     SLR     Supine hip ADD isometric     Supine march          Blank cell = exercise not performed today    06/11/24                                 EXERCISE LOG    LT hip  Exercise Repetitions and Resistance Comments  Nustep  Lvl 4 x 15 mins   Seated SKTC    Forward Step Ups 6 box  2x 15 reps   Rockerboard 5 mins   Lunges bil 14 box x 3 mins each   STS withUE assist x 10 focus on glutes   Standing Hip Abduction    Standing Hip Extensions    Standing Hip Flexion    Balance SLS eachside with toe touch and tandem st    Blank cell = exercise not performed today   Modalities  Date:  Unattended Estim: Hip, IFC 80-150 Hz, 15 mins, Pain and Tone    PATIENT EDUCATION:  Education details:  HEP  Person educated: Patient Education method: Explanation Education comprehension: verbalized understanding  HOME EXERCISE PROGRAM: [id number]  ASSESSMENT:  CLINICAL IMPRESSION: Pt arrives for today's treatment session reporting 3/10 left hip pain.  Since last treatment session pt has had a hospital stay due to syncope episode and cardiac issues.  Due to these issues pt has developed tone in bil quads and has decreased strength bil.  Gentle strengthening exercises and stretches performed today due to pt regression.  STW/M performed to left quad and IT band to decrease pain and tone.  Discussed self massage and use of moist heat at home for quad soreness and tone.   Pt reported 2/10 left hip pain at completion of today's treatment session.   OBJECTIVE IMPAIRMENTS: Abnormal gait, decreased activity tolerance, decreased balance, decreased mobility, difficulty walking, decreased ROM, decreased strength, hypomobility, impaired tone, and pain.   ACTIVITY LIMITATIONS: standing, squatting, sleeping, stairs, transfers, bed mobility, dressing, and locomotion level  PARTICIPATION LIMITATIONS: cleaning, driving,  shopping, and community activity  PERSONAL FACTORS: Past/current experiences, Time since onset of injury/illness/exacerbation, and 3+ comorbidities: Hypertension, osteoporosis, anxiety, and depression are also affecting patient's functional outcome.   REHAB POTENTIAL: Good  CLINICAL DECISION MAKING: Evolving/moderate complexity  EVALUATION COMPLEXITY: Moderate   GOALS: Goals reviewed with patient? Yes  SHORT TERM GOALS: Target date: 06/14/24 Patient will be independent with her initial HEP. Baseline: Goal status: MET  2.  Patient will be able to demonstrate at least 65 degrees of left hip flexion for improved function squatting. Baseline: 94 degrees Goal status: INITIAL  3.  Patient will improve her LEFS score to at least 40/80 for improved perceived function with her daily activities. Baseline: 27/80  Goal status: IN PROGRESS  4.  Patient will be able to safely ambulate without an assistive device for at least 80 feet for improved household mobility. Baseline: 157 ft no AD Goal status: MET  LONG TERM GOALS: Target date: 07/05/24  Patient will be independent with her advanced HEP. Baseline:  Goal status: IN PROGRESS  2.  Patient will be able to ambulate with minimal to no significant gait deviations for improved community mobility. Baseline:  Goal status: IN PROGRESS  3.  Patient will improve her LEFS score to at least 50/80 for improved perceived function with her daily activities. Baseline:  Goal status: IN PROGRESS  4.  Patient will be able to demonstrate at least 85 degrees of left hip flexion for improved function getting into and out of her car. Baseline: 94 degrees Goal status: MET  5.  Patient will be able to navigate at least 3 steps with a reciprocal pattern for improved household mobility. Baseline:  Goal status: MET  PLAN:  PT FREQUENCY: 2x/week  PT DURATION: 6 weeks  PLANNED INTERVENTIONS: 02835- PT Re-evaluation, 97750- Physical Performance  Testing, 97110-Therapeutic exercises, 97530- Therapeutic activity, 97112- Neuromuscular re-education, 97535- Self Care, 02859- Manual therapy, [phone]- Gait training, [phone]- Electrical stimulation (unattended), 97016- Vasopneumatic device, Patient/Family education, Balance training, Stair training, Joint mobilization, Cryotherapy, and Moist heat  PLAN FOR NEXT SESSION: Nustep, gait training, lower extremity strengthening, manual therapy, and modalities as needed   Delon DELENA Gosling, PTA 07/12/2024, 2:18 PM

## 2024-07-13 ENCOUNTER — Ambulatory Visit: Attending: Emergency Medicine | Admitting: Emergency Medicine

## 2024-07-13 ENCOUNTER — Encounter: Payer: Self-pay | Admitting: Emergency Medicine

## 2024-07-13 VITALS — BP 132/80 | HR 60 | Ht 65.5 in | Wt 132.0 lb

## 2024-07-13 DIAGNOSIS — Z720 Tobacco use: Secondary | ICD-10-CM | POA: Diagnosis not present

## 2024-07-13 DIAGNOSIS — I5022 Chronic systolic (congestive) heart failure: Secondary | ICD-10-CM | POA: Diagnosis not present

## 2024-07-13 DIAGNOSIS — E782 Mixed hyperlipidemia: Secondary | ICD-10-CM | POA: Diagnosis not present

## 2024-07-13 DIAGNOSIS — I10 Essential (primary) hypertension: Secondary | ICD-10-CM

## 2024-07-13 DIAGNOSIS — I6523 Occlusion and stenosis of bilateral carotid arteries: Secondary | ICD-10-CM

## 2024-07-13 DIAGNOSIS — R55 Syncope and collapse: Secondary | ICD-10-CM

## 2024-07-13 DIAGNOSIS — I2510 Atherosclerotic heart disease of native coronary artery without angina pectoris: Secondary | ICD-10-CM | POA: Diagnosis not present

## 2024-07-13 DIAGNOSIS — I5181 Takotsubo syndrome: Secondary | ICD-10-CM | POA: Diagnosis not present

## 2024-07-13 LAB — BASIC METABOLIC PANEL WITH GFR
BUN/Creatinine Ratio: 11 — ABNORMAL LOW (ref 12–28)
BUN: 9 mg/dL (ref 8–27)
CO2: 22 mmol/L (ref 20–29)
Calcium: 10.2 mg/dL (ref 8.7–10.3)
Chloride: 104 mmol/L (ref 96–106)
Creatinine, Ser: 0.81 mg/dL (ref 0.57–1.00)
Glucose: 98 mg/dL (ref 70–99)
Potassium: 4.3 mmol/L (ref 3.5–5.2)
Sodium: 141 mmol/L (ref 134–144)
eGFR: 77 mL/min/1.73 (ref >59)

## 2024-07-13 NOTE — Progress Notes (Signed)
 Cardiology Office Note:    Date:  07/13/2024  ID:  Jody Wilson, DOB 03-22-1951, MRN [id number] PCP: Dettinger, Fonda LABOR, MD  [facility] Providers Cardiologist:  Madonna Large, DO       Patient Profile:       Chief Complaint: Hospital follow-up History of Present Illness:  Jody Wilson is a 73 y.o. female with visit-pertinent history of hypertension, hyperlipidemia, carotid artery disease, history of chest pain (false positive NST on 07/2020 with cath in 09/2020 showing normal coronaries)  Patient was examined by Dr. Large in 03/2024 and denied any recent anginal symptoms at that time. Was continued on her current cardiac medications of ASA 81 mg twice daily, Zetia  10 mg daily, HCTZ 12.5 mg daily, Olmesartan  20 mg daily and Pravastatin  40 mg daily.   She presented to the hospital on on 06/26/2024 after having a syncopal episode.  She reported that she remembers getting out of her car but woke up on the ground with EMS present.  She reports not sleeping well and not eating/drinking due to her husband being currently admitted to the hospital.  She also reported having a prior syncopal event on 03/2024 and at that time she suffered a left hip femoral neck fracture and underwent ORIF.  Initial and repeat troponins were elevated at 588, 1116, 1221, and 865.  She was found to be hypoxic in the ED and started on O2.  Chest x-ray showed no disease with moderate to large hiatal hernia.  CT head showed no acute abnormalities.  EKG showed normal sinus rhythm with no acute ST changes.  Echocardiogram showed LVEF 45 to 50% with mildly decreased function and regional wall motion abnormalities, grade 1 diastolic dysfunction.  Left heart cath 7/9 showed mild CAD with no obstructive disease. CTA 06/28/24 showed no evidence of PE though did reveal new small R>L pleural effusions with likely atelectasis. This in the setting of mild COPD is most likely contributing to her hypoxia.  She does report significant  personal life stress with the death of a sister-in-law several weeks ago, a brother-in-law awaiting renal transplant with other medical conditions, and her husband is currently hospitalized at [facility].  She also has been without her Xanax  prescription for the last week and has been experiencing what sounds to be withdrawal symptoms.  She was started on carvedilol  3.125 mg twice daily and Lasix  as needed.  SGLT2i/spironolactone were not added and can be considered outpatient due to syncope.   Discussed the use of AI scribe software for clinical note transcription with the patient, who gave verbal consent to proceed.  History of Present Illness Jody Wilson is a 73 year old female who presents after hospital follow-up  Today she is doing well overall without acute cardiovascular concerns or complaints.  She is without any chest pains and is no longer experiencing any dyspnea.  She has not experienced further syncopal episodes since hospitalization.  She also denies any presyncope, lightheadedness, dizziness, palpitations.  She reports to me that her syncopal episode is likely occurring from her not eating or drinking any fluids due to life stressors.  She is without any chest pains, dyspnea, or leg swelling.  She has not had to take her as needed Lasix  since discharge.  She has significantly reduced her smoking, showing a pack with her husband over 5 days.   Review of systems:  Please see the history of present illness. All other systems are reviewed and otherwise negative.  Studies Reviewed:        Echocardiogram 06/27/2024 1. Left ventricular ejection fraction, by estimation, is 45 to 50%. Left  ventricular ejection fraction by 3D volume is 51 %. The left ventricle has  mildly decreased function. The left ventricle demonstrates regional wall  motion abnormalities in a  pattern consistent with Takostubo cardiomyopathy versus LAD territory  disease. Left ventricular diastolic parameters  are consistent with Grade I  diastolic dysfunction (impaired relaxation). The average left ventricular  global longitudinal strain is -13.4   %. The global longitudinal strain is abnormal.   2. Right ventricular systolic function is normal. The right ventricular  size is normal. There is mildly elevated pulmonary artery systolic  pressure. The estimated right ventricular systolic pressure is 37.6 mmHg.   3. The mitral valve is normal in structure. No evidence of mitral valve  regurgitation. No evidence of mitral stenosis.   4. The aortic valve has an indeterminant number of cusps. Aortic valve  regurgitation is not visualized. No aortic stenosis is present.   5. The inferior vena cava is normal in size with greater than 50%  respiratory variability, suggesting right atrial pressure of 3 mmHg.   Cardiac catheterization 06/27/2024   2nd Diag lesion is 40% stenosed.   Mild plaque in the ostium of the diagonal branch but no obstructive disease noted in the LAD, Circumflex or RCA.  LVEDP=21 mmhg Diagnostic Dominance: Right  Risk Assessment/Calculations:              Physical Exam:   VS:  BP 132/80 (BP Location: Left Arm, Patient Position: Sitting, Cuff Size: Normal)   Pulse 60   Ht 5' 5.5 (1.664 m)   Wt 132 lb (59.9 kg)   BMI 21.63 kg/m    Wt Readings from Last 3 Encounters:  07/13/24 132 lb (59.9 kg)  07/06/24 138 lb (62.6 kg)  06/27/24 142 lb 10.2 oz (64.7 kg)    GEN: Well nourished, well developed in no acute distress NECK: No JVD; No carotid bruits CARDIAC: RRR, no murmurs, rubs, gallops RESPIRATORY:  Clear to auscultation without rales, wheezing or rhonchi  ABDOMEN: Soft, non-tender, non-distended EXTREMITIES:  No edema; No acute deformity      Assessment and Plan:  Stress-induced cardiomyopathy Echocardiogram 02/2024 with LVEF 60 to 65% Echocardiogram 06/2024 with LVEF 45 to 50%, 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  Off O2 at discharge - This occurred during a time of significant life stressors with recent death in the family and husband that was recently hospitalized.  She had little if any food or water for at least 2 days prior to admission - Today she is doing exceptionally well.  She is without any concerning symptoms.  No dyspnea, PND, orthopnea, leg swelling, chest pains.  Ambulates well without shortness of breath.  Feels like stress is under better control - She has not had to use as needed loop diuretic - Will hold off on further GDMT titration at this time given her improvement in symptoms and repeat echocardiogram in 1 month to reevaluate her LV function - Continue carvedilol  3.125 mg twice daily, olmesartan  10 mg daily, Lasix  20 mg as needed - BMET today  Syncope Likely  multifactorial Admitted 06/2024 in the setting of syncope and acute HF/hypoxic respiratory failure.  Was without adequate food/water for several days prior to syncope due to significant stress and focusing on husband who was admitted She also describes several syncopal episodes over the past year with 2 episodes resulting in hip fractures - Live ZIO was placed at discharge that patient is still currently wearing at this time.  There has been no critical events noted - Today she denies any recurrent syncope or presyncope.  Denies any palpitations or tachycardia.  She does feel her prior episodes are in the setting of inadequate hydration/nutrition - Will consider EP referral for ILR if pending ZIO remains unrevealing or if she has further episodes of syncope in the future - She was instructed on avoidance of driving x 6 months at  discharge per [place] DMV recommendations.   Coronary artery disease Cardiac catheterization 06/2024 showed second diagonal lesion 40% stenosis with no obstructive disease noted to LAD, circumflex, RCA - Today patient is without anginal symptoms, no indication for further ischemic evaluation at this time - Continue aspirin  81 mg daily, pravastatin  40 mg daily, ezetimibe  10 mg daily  Hypertension Blood pressure today well-controlled at 132/80 - Continue carvedilol  3.125 mg twice daily and olmesartan  10 mg daily  Hyperlipidemia, LDL goal <70 LDL 53 on 06/2024 and well-controlled - Continue pravastatin  40 mg daily and ezetimibe  10 mg daily  Carotid artery disease Carotid duplex 03/2023 with minimal bilateral carotid stenosis - Continue pravastatin  40 mg daily and ezetimibe  10 mg daily - Encouraged tobacco cessation and heart healthy dieting  Tobacco abuse She does continue to smoke - Reemphasized importance of tobacco cessation      Dispo:  Return in about 3 months (around 10/13/2024).  Signed, Lum LITTIE Louis, NP

## 2024-07-13 NOTE — Patient Instructions (Signed)
 Medication Instructions:  NO CHANGES    Lab Work: BMET TO BE DONE.   Testing/Procedures: Your physician has requested that you have an echocardiogram. Echocardiography is a painless test that uses sound waves to create images of your heart. It provides your doctor with information about the size and shape of your heart and how well your heart's chambers and valves are working. This procedure takes approximately one hour. There are no restrictions for this procedure. Please do NOT wear cologne, perfume, aftershave, or lotions (deodorant is allowed). Please arrive 15 minutes prior to your appointment time.  Please note: We ask at that you not bring children with you during ultrasound (echo/ vascular) testing. Due to room size and safety concerns, children are not allowed in the ultrasound rooms during exams. Our front office staff cannot provide observation of children in our lobby area while testing is being conducted. An adult accompanying a patient to their appointment will only be allowed in the ultrasound room at the discretion of the ultrasound technician under special circumstances. We apologize for any inconvenience.   Follow-Up: At [facility], you and your health needs are our priority.  As part of our continuing mission to provide you with exceptional heart care, our providers are all part of one team.  This team includes your primary Cardiologist (physician) and Advanced Practice Providers or APPs (Physician Assistants and Nurse Practitioners) who all work together to provide you with the care you need, when you need it.  Your next appointment:   3 MONTHS  Provider:   Madonna Large, DO

## 2024-07-16 ENCOUNTER — Ambulatory Visit: Payer: Self-pay | Admitting: Emergency Medicine

## 2024-07-16 ENCOUNTER — Ambulatory Visit

## 2024-07-16 DIAGNOSIS — M25652 Stiffness of left hip, not elsewhere classified: Secondary | ICD-10-CM

## 2024-07-16 DIAGNOSIS — M25552 Pain in left hip: Secondary | ICD-10-CM | POA: Diagnosis not present

## 2024-07-16 DIAGNOSIS — R262 Difficulty in walking, not elsewhere classified: Secondary | ICD-10-CM

## 2024-07-16 DIAGNOSIS — M6281 Muscle weakness (generalized): Secondary | ICD-10-CM | POA: Diagnosis not present

## 2024-07-16 NOTE — Therapy (Addendum)
 OUTPATIENT PHYSICAL THERAPY LOWER EXTREMITY TREATMENT   Patient Name: Jody Wilson MRN: [id number] DOB:06/11/1951, 73 y.o., female Today's Date: 07/16/2024  END OF SESSION:  PT End of Session - 07/16/24 1520     Visit Number 10    Number of Visits 12    Date for PT Re-Evaluation 08/17/24    PT Start Time 1515    Activity Tolerance Patient tolerated treatment well    Behavior During Therapy [facility] for tasks assessed/performed           Past Medical History:  Diagnosis Date   Anemia    Anxiety    Aortic atherosclerosis (HCC)    Carotid artery disease (HCC)    Depression    GERD (gastroesophageal reflux disease)    Hyperlipidemia    Hypertension    Orthostatic hypotension    Osteoporosis    Past Surgical History:  Procedure Laterality Date   ABDOMINAL HYSTERECTOMY     BACK SURGERY     CHOLECYSTECTOMY     FOOT SURGERY     7 in the past   HIP ARTHROPLASTY Right 02/11/2023   Procedure: ARTHROPLASTY BIPOLAR HIP (HEMIARTHROPLASTY);  Surgeon: Onesimo Oneil LABOR, MD;  Location: [facility];  Service: Orthopedics;  Laterality: Right;   HIP ARTHROPLASTY Left 04/13/2024   Procedure: HEMIARTHROPLASTY (BIPOLAR) HIP, DIRECT LATERAL  FOR FRACTURE;  Surgeon: Margrette Taft BRAVO, MD;  Location: [facility];  Service: Orthopedics;  Laterality: Left;   IR GENERIC HISTORICAL  12/09/2016   IR RADIOLOGIST EVAL & MGMT 12/09/2016 [facility]   IR GENERIC HISTORICAL  12/10/2016   IR VERTEBROPLASTY CERV/THOR BX INC UNI/BIL INC/INJECT/IMAGING 12/10/2016 [facility]   LEFT HEART CATH AND CORONARY ANGIOGRAPHY N/A 09/30/2020   Procedure: LEFT HEART CATH AND CORONARY ANGIOGRAPHY;  Surgeon: Elmira Newman PARAS, MD;  Location: [facility];  Service: Cardiovascular;  Laterality: N/A;   LEFT HEART CATH AND CORONARY ANGIOGRAPHY N/A 06/27/2024   Procedure: LEFT HEART CATH AND CORONARY ANGIOGRAPHY;  Surgeon: Verlin Lonni BIRCH, MD;  Location: [facility];  Service: Cardiovascular;  Laterality:  N/A;   TRACHEOSTOMY TUBE PLACEMENT  1982   Patient Active Problem List   Diagnosis Date Noted   NSTEMI (non-ST elevated myocardial infarction) (HCC) 06/29/2024   Syncope 06/27/2024   Elevated troponin 06/27/2024   Acute respiratory failure with hypoxia (HCC) 06/27/2024   Abnormal CT of liver 06/26/2024   Incontinence of feces 05/08/2024   Closed fracture of neck of left femur (HCC) 04/13/2024   Hypokalemia 04/13/2024   Closed hip fracture requiring operative repair with routine healing 02/09/2023   Pulmonary nodule 02/09/2023   Hypertriglyceridemia 01/22/2022   Abnormal findings on diagnostic imaging of liver and biliary tract 12/09/2021   Diarrhea 12/09/2021   History of colonic polyps 12/09/2021   Anxiety and depression 06/23/2020   Hiatal hernia 06/23/2020   Thoracic aortic atherosclerosis (HCC) 06/23/2020   Smoking history 04/25/2020   Involuntary muscle jerks while sleeping 09/26/2019   B12 deficiency 09/25/2019   Iron deficiency anemia 09/25/2019   Elevated liver function tests 09/07/2019   Osteoporosis 02/09/2017   History of vertebral fracture 02/09/2017   GERD (gastroesophageal reflux disease) 03/11/2016   Hyperlipidemia with target LDL less than 100 02/19/2015   Hypertension 03/18/2014    PCP: Dettinger, Fonda LABOR, MD  REFERRING PROVIDER: Margrette Taft BRAVO, MD   REFERRING DIAG: Closed fracture of left hip requiring operative repair with routine healing, subsequent encounter   THERAPY DIAG:  Pain in left hip  Muscle  weakness (generalized)  Stiffness of left hip, not elsewhere classified  Difficulty in walking, not elsewhere classified  Rationale for Evaluation and Treatment: Rehabilitation  ONSET DATE: 04/13/24  SUBJECTIVE:   SUBJECTIVE STATEMENT: Patient reports 3/10 left hip pain today.    PERTINENT HISTORY: Hypertension, osteoporosis, anxiety, and depression PAIN:  Are you having pain? Yes: NPRS scale: 3/10 Pain location: left  hip  PRECAUTIONS: Fall  RED FLAGS: None   WEIGHT BEARING RESTRICTIONS: No  FALLS:  Has patient fallen in last 6 months? Yes. Number of falls 2  LIVING ENVIRONMENT: Lives with: lives with their spouse Lives in: House/apartment Stairs: Yes: External: 2 steps; none; step to pattern Has following equipment at home: Single point cane  OCCUPATION: retired  PLOF: Independent  PATIENT GOALS: improved mobility, be able to get into and out of her car, and be able to walk without a cane  NEXT MD VISIT: 06/28/24  OBJECTIVE:  Note: Objective measures were completed at Evaluation unless otherwise noted.  DIAGNOSTIC FINDINGS: 05/17/24 L hip x-ray Impression stable implant left hip status post bipolar replacement for left hip femoral neck fracture  PATIENT SURVEYS:  LEFS  Extreme difficulty/unable (0), Quite a bit of difficulty (1), Moderate difficulty (2), Little difficulty (3), No difficulty (4) Survey date:  05/24/24  Any of your usual work, housework or school activities 2  2. Usual hobbies, recreational or sporting activities 1  3. Getting into/out of the bath 1  4. Walking between rooms 2  5. Putting on socks/shoes 3  6. Squatting  1  7. Lifting an object, like a bag of groceries from the floor 1  8. Performing light activities around your home 2  9. Performing heavy activities around your home 1  10. Getting into/out of a car 1  11. Walking 2 blocks 1  12. Walking 1 mile 1  13. Going up/down 10 stairs (1 flight) 1  14. Standing for 1 hour 1  15.  sitting for 1 hour 3  16. Running on even ground 1  17. Running on uneven ground 1  18. Making sharp turns while running fast 1  19. Hopping  1  20. Rolling over in bed 1  Score total:  27/80     COGNITION: Overall cognitive status: Within functional limits for tasks assessed     SENSATION: Patient reports no numbness or tingling.   PALPATION: TTP: left hip flexors, TFL, IT band, quadriceps, and gluteals  LOWER EXTREMITY  ROM:  Active ROM Right eval Left eval  Hip flexion 93 44; limited by pain  Hip extension    Hip abduction    Hip adduction    Hip internal rotation    Hip external rotation    Knee flexion    Knee extension    Ankle dorsiflexion    Ankle plantarflexion    Ankle inversion    Ankle eversion     (Blank rows = not tested)  LOWER EXTREMITY MMT:  MMT Right eval Left eval  Hip flexion 3+/5 3/5; familiar pain  Hip extension    Hip abduction    Hip adduction    Hip internal rotation    Hip external rotation    Knee flexion 4/5 3/5  Knee extension 4+/5 3+/5; limited by familiar pain  Ankle dorsiflexion    Ankle plantarflexion    Ankle inversion    Ankle eversion     (Blank rows = not tested)  LOWER EXTREMITY SPECIAL TESTS:  Not tested due to surgical condition  GAIT: Assistive device utilized: Single point cane Level of assistance: Modified independence Comments: antalgic gait pattern with left hip external rotation                                                                                                                                TREATMENT DATE:      07/12/24                                 EXERCISE LOG  Exercise Repetitions and Resistance Comments  Nustep Lvl 3 x 15 mins   Rockerboard 5 mins   Lunges 14 box x 3 mins each   Forward Step Ups 4 box x 20 reps bil   LAQs    Seated Marches    Seated Ham Curls     Blank cell = exercise not performed today   Manual Therapy Soft Tissue Mobilization: left quad and IT band, STW/M to left quad and IT band to decrease pain and tone with pt in supine for comfort   06/21/24     EXERCISE LOG  Exercise Repetitions and Resistance Comments  Nustep  L5 x 16 minutes   Cybex Knee Flexion 30# x 4 mins   Cybex Knee Extension 10# x 4 mins   Leg Press 2 plates; seat 7; 4 mins   STS 5 reps , no UE support   Stairs 4 step x 3 reps   Squatting   BUE support   Standing hip ABD     Lower trunk rotation    Bridge      SLR     Supine hip ADD isometric     Supine march          Blank cell = exercise not performed today    06/11/24                                 EXERCISE LOG    LT hip  Exercise Repetitions and Resistance Comments  Nustep  Lvl 4 x 15 mins   Seated SKTC    Forward Step Ups 6 box  2x 15 reps   Rockerboard 5 mins   Lunges bil 14 box x 3 mins each   STS withUE assist x 10 focus on glutes   Standing Hip Abduction    Standing Hip Extensions    Standing Hip Flexion    Balance SLS eachside with toe touch and tandem st    Blank cell = exercise not performed today   Modalities  Date:  Unattended Estim: Hip, IFC 80-150 Hz, 15 mins, Pain and Tone    PATIENT EDUCATION:  Education details: HEP  Person educated: Patient Education method: Explanation Education comprehension: verbalized understanding  HOME EXERCISE PROGRAM: [id number]  ASSESSMENT:  CLINICAL IMPRESSION: Pt arrives for today's treatment session reporting  3/10 left hip pain.  Pt able to increase LEFS score to 40/80, meeting her STG.  Pt with continued antalgic gait and plans to use her SPC until she returns to her MD in a month.  STW/M performed to left quad to decrease pain and tone.  Pt reported decreased pain at completion of today's treatment session.   07/16/24 PROGRESS REPORT:  Patient is making good progress toward her goals for physical therapy as she has met all of her short term goals for physical therapy. She has also met her long term goals for hip flexion and stair navigation. However, she continues to exhibit gait deviations which limit her functional mobility. Recommend that she continue with skilled physical therapy to address her remaining impairments to return to her prior level of function.   Lacinda Fass, PT, DPT   OBJECTIVE IMPAIRMENTS: Abnormal gait, decreased activity tolerance, decreased balance, decreased mobility, difficulty walking, decreased ROM, decreased strength, hypomobility, impaired tone,  and pain.   ACTIVITY LIMITATIONS: standing, squatting, sleeping, stairs, transfers, bed mobility, dressing, and locomotion level  PARTICIPATION LIMITATIONS: cleaning, driving, shopping, and community activity  PERSONAL FACTORS: Past/current experiences, Time since onset of injury/illness/exacerbation, and 3+ comorbidities: Hypertension, osteoporosis, anxiety, and depression are also affecting patient's functional outcome.   REHAB POTENTIAL: Good  CLINICAL DECISION MAKING: Evolving/moderate complexity  EVALUATION COMPLEXITY: Moderate   GOALS: Goals reviewed with patient? Yes  SHORT TERM GOALS: Target date: 06/14/24 Patient will be independent with her initial HEP. Baseline: Goal status: MET  2.  Patient will be able to demonstrate at least 65 degrees of left hip flexion for improved function squatting. Baseline: 94 degrees Goal status: MET5  3.  Patient will improve her LEFS score to at least 40/80 for improved perceived function with her daily activities. Baseline: 27/80; 7/28: 40/80 Goal status: MET  4.  Patient will be able to safely ambulate without an assistive device for at least 80 feet for improved household mobility. Baseline: 157 ft no AD Goal status: MET  LONG TERM GOALS: Target date: 07/05/24  Patient will be independent with her advanced HEP. Baseline:  Goal status: IN PROGRESS  2.  Patient will be able to ambulate with minimal to no significant gait deviations for improved community mobility. Baseline:  Goal status: IN PROGRESS  3.  Patient will improve her LEFS score to at least 50/80 for improved perceived function with her daily activities. Baseline:  Goal status: IN PROGRESS  4.  Patient will be able to demonstrate at least 85 degrees of left hip flexion for improved function getting into and out of her car. Baseline: 94 degrees Goal status: MET  5.  Patient will be able to navigate at least 3 steps with a reciprocal pattern for improved household  mobility. Baseline:  Goal status: MET  PLAN:  PT FREQUENCY: 2x/week  PT DURATION: 6 weeks  PLANNED INTERVENTIONS: 02835- PT Re-evaluation, 97750- Physical Performance Testing, 97110-Therapeutic exercises, 97530- Therapeutic activity, 97112- Neuromuscular re-education, 97535- Self Care, 02859- Manual therapy, [phone]- Gait training, [phone]- Electrical stimulation (unattended), [id number]- Vasopneumatic device, Patient/Family education, Balance training, Stair training, Joint mobilization, Cryotherapy, and Moist heat  PLAN FOR NEXT SESSION: Nustep, gait training, lower extremity strengthening, manual therapy, and modalities as needed   Delon DELENA Gosling, PTA 07/16/2024, 4:18 PM

## 2024-07-17 ENCOUNTER — Ambulatory Visit

## 2024-07-17 ENCOUNTER — Inpatient Hospital Stay

## 2024-07-17 VITALS — BP 157/80 | HR 55 | Temp 97.8°F | Resp 18

## 2024-07-17 DIAGNOSIS — F17200 Nicotine dependence, unspecified, uncomplicated: Secondary | ICD-10-CM | POA: Diagnosis not present

## 2024-07-17 DIAGNOSIS — I10 Essential (primary) hypertension: Secondary | ICD-10-CM | POA: Diagnosis not present

## 2024-07-17 DIAGNOSIS — R55 Syncope and collapse: Secondary | ICD-10-CM | POA: Diagnosis not present

## 2024-07-17 DIAGNOSIS — R1110 Vomiting, unspecified: Secondary | ICD-10-CM | POA: Diagnosis not present

## 2024-07-17 DIAGNOSIS — Z79899 Other long term (current) drug therapy: Secondary | ICD-10-CM | POA: Diagnosis not present

## 2024-07-17 DIAGNOSIS — R5383 Other fatigue: Secondary | ICD-10-CM | POA: Diagnosis not present

## 2024-07-17 DIAGNOSIS — E538 Deficiency of other specified B group vitamins: Secondary | ICD-10-CM | POA: Diagnosis not present

## 2024-07-17 DIAGNOSIS — R42 Dizziness and giddiness: Secondary | ICD-10-CM | POA: Diagnosis not present

## 2024-07-17 DIAGNOSIS — E785 Hyperlipidemia, unspecified: Secondary | ICD-10-CM | POA: Diagnosis not present

## 2024-07-17 DIAGNOSIS — D509 Iron deficiency anemia, unspecified: Secondary | ICD-10-CM | POA: Diagnosis not present

## 2024-07-17 DIAGNOSIS — E559 Vitamin D deficiency, unspecified: Secondary | ICD-10-CM | POA: Diagnosis not present

## 2024-07-17 DIAGNOSIS — I252 Old myocardial infarction: Secondary | ICD-10-CM | POA: Diagnosis not present

## 2024-07-17 MED ORDER — SODIUM CHLORIDE 0.9 % IV SOLN
510.0000 mg | Freq: Once | INTRAVENOUS | Status: AC
  Administered 2024-07-17: 510 mg via INTRAVENOUS
  Filled 2024-07-17: qty 510

## 2024-07-17 MED ORDER — SODIUM CHLORIDE 0.9 % IV SOLN: Freq: Once | INTRAVENOUS | Status: AC

## 2024-07-17 NOTE — Progress Notes (Signed)
 Patient tolerated iron infusion with no complaints voiced.  Peripheral IV site clean and dry with good blood return noted before and after infusion.  Band aid applied.  VSS with discharge and left in satisfactory condition with no s/s of distress noted.

## 2024-07-17 NOTE — Patient Instructions (Signed)

## 2024-07-18 ENCOUNTER — Ambulatory Visit

## 2024-07-18 DIAGNOSIS — M6281 Muscle weakness (generalized): Secondary | ICD-10-CM

## 2024-07-18 DIAGNOSIS — R262 Difficulty in walking, not elsewhere classified: Secondary | ICD-10-CM

## 2024-07-18 DIAGNOSIS — M25552 Pain in left hip: Secondary | ICD-10-CM | POA: Diagnosis not present

## 2024-07-18 DIAGNOSIS — M25652 Stiffness of left hip, not elsewhere classified: Secondary | ICD-10-CM | POA: Diagnosis not present

## 2024-07-18 NOTE — Therapy (Signed)
 OUTPATIENT PHYSICAL THERAPY LOWER EXTREMITY TREATMENT   Patient Name: Jody Wilson MRN: [id number] DOB:05/03/1951, 73 y.o., female Today's Date: 07/18/2024  END OF SESSION:  PT End of Session - 07/18/24 1436     Visit Number 11    Number of Visits 12    Date for PT Re-Evaluation 08/17/24    PT Start Time 1430    PT Stop Time 1516    PT Time Calculation (min) 46 min    Activity Tolerance Patient tolerated treatment well    Behavior During Therapy WFL for tasks assessed/performed           Past Medical History:  Diagnosis Date   Anemia    Anxiety    Aortic atherosclerosis (HCC)    Carotid artery disease (HCC)    Depression    GERD (gastroesophageal reflux disease)    Hyperlipidemia    Hypertension    Orthostatic hypotension    Osteoporosis    Past Surgical History:  Procedure Laterality Date   ABDOMINAL HYSTERECTOMY     BACK SURGERY     CHOLECYSTECTOMY     FOOT SURGERY     7 in the past   HIP ARTHROPLASTY Right 02/11/2023   Procedure: ARTHROPLASTY BIPOLAR HIP (HEMIARTHROPLASTY);  Surgeon: Onesimo Oneil LABOR, MD;  Location: [facility];  Service: Orthopedics;  Laterality: Right;   HIP ARTHROPLASTY Left 04/13/2024   Procedure: HEMIARTHROPLASTY (BIPOLAR) HIP, DIRECT LATERAL  FOR FRACTURE;  Surgeon: Margrette Taft BRAVO, MD;  Location: [facility];  Service: Orthopedics;  Laterality: Left;   IR GENERIC HISTORICAL  12/09/2016   IR RADIOLOGIST EVAL & MGMT 12/09/2016 [facility]   IR GENERIC HISTORICAL  12/10/2016   IR VERTEBROPLASTY CERV/THOR BX INC UNI/BIL INC/INJECT/IMAGING 12/10/2016 [facility]   LEFT HEART CATH AND CORONARY ANGIOGRAPHY N/A 09/30/2020   Procedure: LEFT HEART CATH AND CORONARY ANGIOGRAPHY;  Surgeon: Elmira Newman PARAS, MD;  Location: [facility];  Service: Cardiovascular;  Laterality: N/A;   LEFT HEART CATH AND CORONARY ANGIOGRAPHY N/A 06/27/2024   Procedure: LEFT HEART CATH AND CORONARY ANGIOGRAPHY;  Surgeon: Verlin Lonni BIRCH, MD;  Location: [facility];  Service: Cardiovascular;  Laterality: N/A;   TRACHEOSTOMY TUBE PLACEMENT  1982   Patient Active Problem List   Diagnosis Date Noted   NSTEMI (non-ST elevated myocardial infarction) (HCC) 06/29/2024   Syncope 06/27/2024   Elevated troponin 06/27/2024   Acute respiratory failure with hypoxia (HCC) 06/27/2024   Abnormal CT of liver 06/26/2024   Incontinence of feces 05/08/2024   Closed fracture of neck of left femur (HCC) 04/13/2024   Hypokalemia 04/13/2024   Closed hip fracture requiring operative repair with routine healing 02/09/2023   Pulmonary nodule 02/09/2023   Hypertriglyceridemia 01/22/2022   Abnormal findings on diagnostic imaging of liver and biliary tract 12/09/2021   Diarrhea 12/09/2021   History of colonic polyps 12/09/2021   Anxiety and depression 06/23/2020   Hiatal hernia 06/23/2020   Thoracic aortic atherosclerosis (HCC) 06/23/2020   Smoking history 04/25/2020   Involuntary muscle jerks while sleeping 09/26/2019   B12 deficiency 09/25/2019   Iron deficiency anemia 09/25/2019   Elevated liver function tests 09/07/2019   Osteoporosis 02/09/2017   History of vertebral fracture 02/09/2017   GERD (gastroesophageal reflux disease) 03/11/2016   Hyperlipidemia with target LDL less than 100 02/19/2015   Hypertension 03/18/2014    PCP: Dettinger, Fonda LABOR, MD  REFERRING PROVIDER: Margrette Taft BRAVO, MD   REFERRING DIAG: Closed fracture of left hip requiring operative repair  with routine healing, subsequent encounter   THERAPY DIAG:  Pain in left hip  Muscle weakness (generalized)  Stiffness of left hip, not elsewhere classified  Difficulty in walking, not elsewhere classified  Rationale for Evaluation and Treatment: Rehabilitation  ONSET DATE: 04/13/24  SUBJECTIVE:   SUBJECTIVE STATEMENT: Patient denies any pain today.   PERTINENT HISTORY: Hypertension, osteoporosis, anxiety, and depression PAIN:  Are you having pain?  No  PRECAUTIONS: Fall  RED FLAGS: None   WEIGHT BEARING RESTRICTIONS: No  FALLS:  Has patient fallen in last 6 months? Yes. Number of falls 2  LIVING ENVIRONMENT: Lives with: lives with their spouse Lives in: House/apartment Stairs: Yes: External: 2 steps; none; step to pattern Has following equipment at home: Single point cane  OCCUPATION: retired  PLOF: Independent  PATIENT GOALS: improved mobility, be able to get into and out of her car, and be able to walk without a cane  NEXT MD VISIT: 06/28/24  OBJECTIVE:  Note: Objective measures were completed at Evaluation unless otherwise noted.  DIAGNOSTIC FINDINGS: 05/17/24 L hip x-ray Impression stable implant left hip status post bipolar replacement for left hip femoral neck fracture  PATIENT SURVEYS:  LEFS  Extreme difficulty/unable (0), Quite a bit of difficulty (1), Moderate difficulty (2), Little difficulty (3), No difficulty (4) Survey date:  05/24/24  Any of your usual work, housework or school activities 2  2. Usual hobbies, recreational or sporting activities 1  3. Getting into/out of the bath 1  4. Walking between rooms 2  5. Putting on socks/shoes 3  6. Squatting  1  7. Lifting an object, like a bag of groceries from the floor 1  8. Performing light activities around your home 2  9. Performing heavy activities around your home 1  10. Getting into/out of a car 1  11. Walking 2 blocks 1  12. Walking 1 mile 1  13. Going up/down 10 stairs (1 flight) 1  14. Standing for 1 hour 1  15.  sitting for 1 hour 3  16. Running on even ground 1  17. Running on uneven ground 1  18. Making sharp turns while running fast 1  19. Hopping  1  20. Rolling over in bed 1  Score total:  27/80     COGNITION: Overall cognitive status: Within functional limits for tasks assessed     SENSATION: Patient reports no numbness or tingling.   PALPATION: TTP: left hip flexors, TFL, IT band, quadriceps, and gluteals  LOWER EXTREMITY  ROM:  Active ROM Right eval Left eval  Hip flexion 93 44; limited by pain  Hip extension    Hip abduction    Hip adduction    Hip internal rotation    Hip external rotation    Knee flexion    Knee extension    Ankle dorsiflexion    Ankle plantarflexion    Ankle inversion    Ankle eversion     (Blank rows = not tested)  LOWER EXTREMITY MMT:  MMT Right eval Left eval  Hip flexion 3+/5 3/5; familiar pain  Hip extension    Hip abduction    Hip adduction    Hip internal rotation    Hip external rotation    Knee flexion 4/5 3/5  Knee extension 4+/5 3+/5; limited by familiar pain  Ankle dorsiflexion    Ankle plantarflexion    Ankle inversion    Ankle eversion     (Blank rows = not tested)  LOWER EXTREMITY SPECIAL TESTS:  Not tested due to surgical condition  GAIT: Assistive device utilized: Single point cane Level of assistance: Modified independence Comments: antalgic gait pattern with left hip external rotation                                                                                                                                TREATMENT DATE:     07/18/24                                 EXERCISE LOG  Exercise Repetitions and Resistance Comments  Nustep Lvl 4 x 15 mins   Cybex Knee Flexion 30# x 4 mins   Cybex Knee Extension 10# x 4 mins   Rockerboard 5 mins   Lunges 14 box x 3 mins each   Forward Step Ups 4 box x 20 reps bil   STS  15 reps    Standing Hip Abduction 2# x 15 reps bil   Standing Ham Curls 2# x 15 reps bil   LAQs    Seated Marches    Seated Ham Curls     Blank cell = exercise not performed today    06/21/24     EXERCISE LOG  Exercise Repetitions and Resistance Comments  Nustep  L5 x 16 minutes   Cybex Knee Flexion 30# x 4 mins   Cybex Knee Extension 10# x 4 mins   Leg Press 2 plates; seat 7; 4 mins   STS 5 reps , no UE support   Stairs 4 step x 3 reps   Squatting   BUE support   Standing hip ABD     Lower trunk rotation     Bridge     SLR     Supine hip ADD isometric     Supine march          Blank cell = exercise not performed today    06/11/24                                 EXERCISE LOG    LT hip  Exercise Repetitions and Resistance Comments  Nustep  Lvl 4 x 15 mins   Seated SKTC    Forward Step Ups 6 box  2x 15 reps   Rockerboard 5 mins   Lunges bil 14 box x 3 mins each   STS withUE assist x 10 focus on glutes   Standing Hip Abduction    Standing Hip Extensions    Standing Hip Flexion    Balance SLS eachside with toe touch and tandem st    Blank cell = exercise not performed today   Modalities  Date:  Unattended Estim: Hip, IFC 80-150 Hz, 15 mins, Pain and Tone    PATIENT EDUCATION:  Education details: HEP  Person educated: Patient Education method:  Explanation Education comprehension: verbalized understanding  HOME EXERCISE PROGRAM: [id number]  ASSESSMENT:  CLINICAL IMPRESSION: Pt arrives for today's treatment session denying any pain.  Pt able to tolerate reintroduction to cybex exercises today with good results.  Pt reports some fatigue, but no pain.  Pt able to perform 12 STS without use of UE support.  Pt introduced to standing hip abduction and ham curls without discomfort.  Pt requiring mins cues to avoid leaning with each rep during abduction.  Pt denied any pain at completion of today's treatment session.   OBJECTIVE IMPAIRMENTS: Abnormal gait, decreased activity tolerance, decreased balance, decreased mobility, difficulty walking, decreased ROM, decreased strength, hypomobility, impaired tone, and pain.   ACTIVITY LIMITATIONS: standing, squatting, sleeping, stairs, transfers, bed mobility, dressing, and locomotion level  PARTICIPATION LIMITATIONS: cleaning, driving, shopping, and community activity  PERSONAL FACTORS: Past/current experiences, Time since onset of injury/illness/exacerbation, and 3+ comorbidities: Hypertension, osteoporosis, anxiety, and depression are also  affecting patient's functional outcome.   REHAB POTENTIAL: Good  CLINICAL DECISION MAKING: Evolving/moderate complexity  EVALUATION COMPLEXITY: Moderate   GOALS: Goals reviewed with patient? Yes  SHORT TERM GOALS: Target date: 06/14/24 Patient will be independent with her initial HEP. Baseline: Goal status: MET  2.  Patient will be able to demonstrate at least 65 degrees of left hip flexion for improved function squatting. Baseline: 94 degrees Goal status: MET  3.  Patient will improve her LEFS score to at least 40/80 for improved perceived function with her daily activities. Baseline: 27/80; 7/28: 40/80 Goal status: MET  4.  Patient will be able to safely ambulate without an assistive device for at least 80 feet for improved household mobility. Baseline: 157 ft no AD Goal status: MET  LONG TERM GOALS: Target date: 07/05/24  Patient will be independent with her advanced HEP. Baseline:  Goal status: IN PROGRESS  2.  Patient will be able to ambulate with minimal to no significant gait deviations for improved community mobility. Baseline:  Goal status: IN PROGRESS  3.  Patient will improve her LEFS score to at least 50/80 for improved perceived function with her daily activities. Baseline:  Goal status: IN PROGRESS  4.  Patient will be able to demonstrate at least 85 degrees of left hip flexion for improved function getting into and out of her car. Baseline: 94 degrees Goal status: MET  5.  Patient will be able to navigate at least 3 steps with a reciprocal pattern for improved household mobility. Baseline:  Goal status: MET  PLAN:  PT FREQUENCY: 2x/week  PT DURATION: 6 weeks  PLANNED INTERVENTIONS: 02835- PT Re-evaluation, 97750- Physical Performance Testing, 97110-Therapeutic exercises, 97530- Therapeutic activity, 97112- Neuromuscular re-education, 97535- Self Care, 02859- Manual therapy, [phone]- Gait training, [phone]- Electrical stimulation (unattended), [id number]-  Vasopneumatic device, Patient/Family education, Balance training, Stair training, Joint mobilization, Cryotherapy, and Moist heat  PLAN FOR NEXT SESSION: Nustep, gait training, lower extremity strengthening, manual therapy, and modalities as needed   Delon DELENA Gosling, PTA 07/18/2024, 4:23 PM

## 2024-07-24 ENCOUNTER — Inpatient Hospital Stay: Attending: Hematology

## 2024-07-24 VITALS — BP 175/83 | HR 53 | Temp 96.6°F | Resp 18

## 2024-07-24 DIAGNOSIS — D509 Iron deficiency anemia, unspecified: Secondary | ICD-10-CM

## 2024-07-24 DIAGNOSIS — E559 Vitamin D deficiency, unspecified: Secondary | ICD-10-CM | POA: Diagnosis not present

## 2024-07-24 DIAGNOSIS — Z79899 Other long term (current) drug therapy: Secondary | ICD-10-CM | POA: Diagnosis not present

## 2024-07-24 MED ORDER — SODIUM CHLORIDE 0.9 % IV SOLN: Freq: Once | INTRAVENOUS | Status: AC

## 2024-07-24 MED ORDER — SODIUM CHLORIDE 0.9 % IV SOLN
510.0000 mg | Freq: Once | INTRAVENOUS | Status: AC
  Administered 2024-07-24: 510 mg via INTRAVENOUS
  Filled 2024-07-24: qty 510

## 2024-07-24 NOTE — Progress Notes (Signed)
 Feraheme iron infusion given per orders. Patient tolerated it well without problems. Vitals stable and discharged home from clinic ambulatory. Follow up as scheduled.

## 2024-07-24 NOTE — Patient Instructions (Signed)

## 2024-07-25 ENCOUNTER — Ambulatory Visit

## 2024-07-26 NOTE — Addendum Note (Signed)
 Encounter addended by: Malvina Pina A on: 07/26/2024 11:00 AM  Actions taken: Imaging Exam ended

## 2024-07-30 ENCOUNTER — Ambulatory Visit: Admitting: Physical Therapy

## 2024-08-07 ENCOUNTER — Other Ambulatory Visit: Payer: Self-pay | Admitting: *Deleted

## 2024-08-08 MED ORDER — CARVEDILOL 3.125 MG PO TABS: 3.1250 mg | ORAL_TABLET | Freq: Two times a day (BID) | ORAL | 2 refills | Status: DC

## 2024-08-13 ENCOUNTER — Encounter: Payer: Self-pay | Admitting: Orthopedic Surgery

## 2024-08-13 ENCOUNTER — Other Ambulatory Visit (INDEPENDENT_AMBULATORY_CARE_PROVIDER_SITE_OTHER)

## 2024-08-13 ENCOUNTER — Ambulatory Visit (INDEPENDENT_AMBULATORY_CARE_PROVIDER_SITE_OTHER): Admitting: Orthopedic Surgery

## 2024-08-13 DIAGNOSIS — S42021K Displaced fracture of shaft of right clavicle, subsequent encounter for fracture with nonunion: Secondary | ICD-10-CM

## 2024-08-13 DIAGNOSIS — S72002D Fracture of unspecified part of neck of left femur, subsequent encounter for closed fracture with routine healing: Secondary | ICD-10-CM | POA: Diagnosis not present

## 2024-08-13 DIAGNOSIS — S42021S Displaced fracture of shaft of right clavicle, sequela: Secondary | ICD-10-CM | POA: Diagnosis not present

## 2024-08-13 DIAGNOSIS — S42021A Displaced fracture of shaft of right clavicle, initial encounter for closed fracture: Secondary | ICD-10-CM

## 2024-08-13 NOTE — Progress Notes (Signed)
   There were no vitals taken for this visit.  There is no height or weight on file to calculate BMI.  Chief Complaint  Patient presents with   Hip Pain    Left- DOS 04/13/24 hurts to sleep on that side and stiff when I first get up but other than that it doesn't bother me. Finished therapy met my goals   Shoulder Pain    Right - clavicle fracture back in 10/11/23 has started bothering me painful to pick up 2 liter drink and hurts with ROM    Encounter Diagnosis  Name Primary?   Closed fracture of left hip requiring operative repair with routine healing, subsequent encounter 06/27/24 Yes    DOI/DOS/ Date: 04/13/24- left hip and R clavicle 10/11/23  Improved - hip Worse - R Shoulder

## 2024-08-13 NOTE — Progress Notes (Signed)
 Chief Complaint  Patient presents with   Hip Pain    Left- DOS 04/13/24 hurts to sleep on that side and stiff when I first get up but other than that it doesn't bother me. Finished therapy met my goals   Shoulder Pain    Right - clavicle fracture back in 10/11/23 has started bothering me painful to pick up 2 liter drink and hurts with ROM   I will address problem #1 first since it is the easier issue to deal with  Her main problem is some stiffness getting out of bed and some pain when she rolls over onto her left side which she has to sleep on because of the right shoulder issue  She has excellent range of motion the hip is stable she feels she no longer needs her cane and that can be discontinued  Regarding the right shoulder she has a clavicle fracture dating back to October 11, 2023 she has noticed increased pain over the fracture site a tingling sensation in the arm and certain movements and difficulty lying on her right side  She says she was not having any issue until the last month or so  Exam shows tenderness at the fracture site prominent lateral end of the medial fragment. Cuff strength is normal 5 out of 5 in abduction and in the scapular plane  Active flexion 110 degrees in the scapular plane  Pain with internal/external rotation of the shoulder and is referred to the chest wall and clavicle  Imaging studies of the clavicle  DG Clavicle Right Result Date: 08/13/2024 X-ray report Chief complaint pain right shoulder with tingling Images [facility] lateral clavicle Reading anterior cervical plate is noted from previous surgery We also see a prominent lateral edge of the medial clavicle fragment and a nonunion with a third fracture fragment.  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  It is okay to stop using the cane. Expect continued improvement over 3 months time  Right clavicle Discussed possible need for ORIF.  She is concerned that her quality of life is not what it was and she is not able to use her right dominant hand as she did previously. Will refer to Dr. Dario as he is the person who would do that type of surgery  See me in 3 months to check on her left hip

## 2024-08-17 ENCOUNTER — Ambulatory Visit (HOSPITAL_COMMUNITY)
Admission: RE | Admit: 2024-08-17 | Discharge: 2024-08-17 | Disposition: A | Discharge status: Home / Self Care | Source: Ambulatory Visit | Attending: Cardiology | Admitting: Cardiology

## 2024-08-17 DIAGNOSIS — I5181 Takotsubo syndrome: Secondary | ICD-10-CM | POA: Diagnosis not present

## 2024-08-17 LAB — ECHOCARDIOGRAM COMPLETE
Area-P 1/2: 3.08 cm2
P 1/2 time: 651 ms
S' Lateral: 2.7 cm

## 2024-08-23 ENCOUNTER — Ambulatory Visit: Admitting: Family Medicine

## 2024-08-26 DIAGNOSIS — R55 Syncope and collapse: Secondary | ICD-10-CM

## 2024-08-30 ENCOUNTER — Ambulatory Visit: Payer: Self-pay | Admitting: Emergency Medicine

## 2024-09-04 ENCOUNTER — Encounter: Payer: Self-pay | Admitting: Orthopedic Surgery

## 2024-09-04 ENCOUNTER — Ambulatory Visit: Admitting: Orthopedic Surgery

## 2024-09-04 DIAGNOSIS — S42021K Displaced fracture of shaft of right clavicle, subsequent encounter for fracture with nonunion: Secondary | ICD-10-CM

## 2024-09-04 NOTE — Progress Notes (Signed)
 Orthopaedic Clinic Return  Assessment: Jody Wilson is a 73 y.o. female with the following: 1.  Right clavicle shaft fracture, nonunion  Plan: Jody Wilson sustained a right clavicle fracture, which has gone on to nonunion.  It is affecting her regular activities.  She does have some pain, primarily with activity.  We discussed proceeding with a fracture nonunion repair, which would consist of removal of scar tissue and bony healing, reduction and placement of a plate.  Risks and benefits have been discussed.  Currently, she states she has several appointments over the next month which she would like to take care of first.  As such, I will see her back in a month to discuss her preferences at that time.  All questions have been answered.  She is amenable to this plan.   Follow-up: Return in about 4 weeks (around 10/02/2024).   Subjective:  Chief Complaint  Patient presents with   Shoulder Pain    R clavicle feels sharp pain in shoulder w/ certain movements.     History of Present Illness: Jody Wilson is a 73 y.o. female who returns to clinic for evaluation of right shoulder pain.  She sustained a right clavicle fracture almost a year ago.  She preferred nonoperative management.  Her pain improved, but she continued to have a deformity.  She is not complaining of progressively worsening pain, and limitations in function.  Unfortunately, she also sustained a left hip fracture, which was treated by Dr. Margrette.  At her last visit, she discussed the right shoulder with Dr. Margrette, who recommended she return to see me for further treatment options.   Review of Systems: No fevers or chills No numbness or tingling No chest pain No shortness of breath No bowel or bladder dysfunction No GI distress No headaches   Objective: There were no vitals taken for this visit.  Physical Exam:  Alert and oriented.  No acute distress.    Evaluation of right shoulder demonstrates a  deformity over the mid clavicle.  There is some motion with compression of the proximal fragment.  Mild tenderness to palpation.  Sensation intact in her hand.  She is able to get her arm beyond 90 degrees of forward flexion, but notes sharp pains in the shoulder.  IMAGING: I personally ordered and reviewed the following images:  No new imaging obtained today.  Jody DELENA Horde, MD 09/04/2024 2:27 PM

## 2024-09-08 DIAGNOSIS — H04123 Dry eye syndrome of bilateral lacrimal glands: Secondary | ICD-10-CM | POA: Diagnosis not present

## 2024-09-08 DIAGNOSIS — H40033 Anatomical narrow angle, bilateral: Secondary | ICD-10-CM | POA: Diagnosis not present

## 2024-09-24 ENCOUNTER — Ambulatory Visit: Attending: Cardiology | Admitting: Cardiology

## 2024-09-24 VITALS — BP 162/84 | HR 69 | Ht 61.0 in | Wt 134.0 lb

## 2024-09-24 DIAGNOSIS — I10 Essential (primary) hypertension: Secondary | ICD-10-CM

## 2024-09-24 DIAGNOSIS — I6523 Occlusion and stenosis of bilateral carotid arteries: Secondary | ICD-10-CM | POA: Diagnosis not present

## 2024-09-24 DIAGNOSIS — I2510 Atherosclerotic heart disease of native coronary artery without angina pectoris: Secondary | ICD-10-CM

## 2024-09-24 DIAGNOSIS — I6521 Occlusion and stenosis of right carotid artery: Secondary | ICD-10-CM

## 2024-09-24 DIAGNOSIS — Z01810 Encounter for preprocedural cardiovascular examination: Secondary | ICD-10-CM | POA: Diagnosis not present

## 2024-09-24 DIAGNOSIS — I700 Atherosclerosis of aorta: Secondary | ICD-10-CM | POA: Diagnosis not present

## 2024-09-24 DIAGNOSIS — F17210 Nicotine dependence, cigarettes, uncomplicated: Secondary | ICD-10-CM | POA: Diagnosis not present

## 2024-09-24 DIAGNOSIS — E782 Mixed hyperlipidemia: Secondary | ICD-10-CM | POA: Diagnosis not present

## 2024-09-24 MED ORDER — OLMESARTAN MEDOXOMIL 20 MG PO TABS: 20.0000 mg | ORAL_TABLET | Freq: Every day | ORAL | 3 refills | Status: DC

## 2024-09-24 NOTE — Patient Instructions (Signed)
 Medication Instructions:  Please take Benicar  20 mg daily. Continue all other medications as listed.  *If you need a refill on your cardiac medications before your next appointment, please call your pharmacy*  Testing/Procedures: Your physician has requested that you have a carotid duplex in October 2026. This test is an ultrasound of the carotid arteries in your neck. It looks at blood flow through these arteries that supply the brain with blood. Allow one hour for this exam. There are no restrictions or special instructions.   Follow-Up: At [facility], you and your health needs are our priority.  As part of our continuing mission to provide you with exceptional heart care, our providers are all part of one team.  This team includes your primary Cardiologist (physician) and Advanced Practice Providers or APPs (Physician Assistants and Nurse Practitioners) who all work together to provide you with the care you need, when you need it.  Your next appointment:   1 year(s)  Provider:   Madonna Large, DO    We recommend signing up for the patient portal called MyChart.  Sign up information is provided on this After Visit Summary.  MyChart is used to connect with patients for Virtual Visits (Telemedicine).  Patients are able to view lab/test results, encounter notes, upcoming appointments, etc.  Non-urgent messages can be sent to your provider as well.   To learn more about what you can do with MyChart, go to [email or web address].

## 2024-09-24 NOTE — Progress Notes (Signed)
 Cardiology Office Note:  .   ID:  Jody Wilson, DOB 1951/08/21, MRN [id number] PCP:  Dettinger, Fonda LABOR, MD  Former Cardiology Providers: None [facility] Providers Cardiologist:  Madonna Large, DO , [facility] (established care 07/29/2020 ) Electrophysiologist:  None  Click to update primary MD,subspecialty MD or APP then REFRESH:1}    Chief Complaint  Patient presents with   Follow-up    Recent syncope 06/2024     History of Present Illness: .   Jody Wilson is a 73 y.o. Caucasian female whose past medical history and cardiovascular risk factors includes: hypertension, hyperlipidemia, GERD, former smoker, Thoracic aortic atherosclerosis, coronary artery calcification, advanced age, postmenopausal female.   Patient is being followed by the practice given her carotid disease.  In the past carotid duplex criteria had noted right ICA disease to be close to 50-69%.  With up titration of medical therapy disease burden did improve on follow-up studies.    Since last office visit patient was hospitalized in July 2025 for a syncope.  She had an echocardiogram during the hospitalization which noted an LVEF of 45%, new regional wall motion abnormalities noted, cardiac cath without any significant obstructive disease and echocardiogram in hindsight consistent with Takotsubo cardiomyopathy.  She also had acute hypoxic respiratory failure requiring supplemental oxygen and CTA chest was negative for pulmonary embolism.  Since follow-up patient has been seen by APP last visit was on July 13, 2024 no significant changes were made during that office visit.  It was felt that her stress-induced cardiomyopathy was secondary to stress w/ husband's recent illness. She had improved her underlying stressors, has not been requiring diuretics, and therefore GDMT was not uptitrated.  Shared decision was to repeat an echocardiogram to reevaluate LVEF prior to today's visit.  Given her recent syncopal event she  had a cardiac monitor.  Average heart rate was 69 bpm.  No atrial fibrillation during the monitoring period.  No significant ectopy.  Repeat echocardiogram from August 2025 illustrates preserved LVEF at 60 to 65%, no regional wall motion abnormalities, and no significant valvular heart disease with the exception of moderate TR.  Patient presents today for follow-up  Patient presents to the office accompanied by her grandson. She denies anginal chest pain or heart failure symptoms. She has been feeling much better since her recent hospitalization. As mentioned above she had a repeat echocardiogram in August 2025 which notes preserved LVEF. Patient has been compliant with her current medical therapy. She plans to undergo surgical intervention in the near future given her right clavicular fracture.  She is under the care of Dr. Oneil Horde.  Date of the surgery is to be determined.  Review of Systems: .   Review of Systems  Cardiovascular:  Negative for chest pain, claudication, irregular heartbeat, leg swelling, near-syncope, orthopnea, palpitations, paroxysmal nocturnal dyspnea and syncope.  Respiratory:  Negative for shortness of breath.   Hematologic/Lymphatic: Negative for bleeding problem.    Studies Reviewed:   Echocardiogram: 02/2024: 60 to 65%, grade 1 diastolic dysfunction, mild MR, see report for additional details  06/2024: LVEF 45-50%, regional wall motion abnormalities, right ventricular size and function normal.  07/2024: LVEF 60-65%, normal right ventricular size and function, mild MR, moderate TR, estimated right atrial pressure 3 mmHg.  Heart Catheterization: 06/2024   2nd Diag lesion is 40% stenosed.   Mild plaque in the ostium of the diagonal branch but no obstructive disease noted in the LAD, Circumflex or RCA.  LVEDP=21 mmhg  Recommendations: Her LV dysfunction is felt to be due to stress induced cardiomyopathy. Medical management of mild CAD  Carotid  duplex: 06/23/2020:  Right Carotid: RICA are consistent with a 1-39% stenosis. Left Carotid: LICA are consistent with a 1-39% stenosis. Vertebrals: Bilateral vertebral arteries demonstrate antegrade flow.    04/30/2021: Doppler velocity suggests stenosis in the right internal carotid artery (50-69%), lower limit of the spectrum. Doppler velocity suggests stenosis in the left internal carotid artery (1-15%).   Carotid artery duplex 11/03/2021:  Duplex suggests stenosis in the right internal carotid artery (1-15%). Duplex suggests stenosis in the left internal carotid artery (minimal).  Carotid artery duplex 03/22/2023:  Duplex suggests stenosis in the right internal carotid artery (minimal).  Duplex suggests stenosis in the left internal carotid artery (minimal).  Antegrade right vertebral artery flow. Antegrade left vertebral artery  flow.  Compared to 11/03/2021, no significant change.   RADIOLOGY: MR Abdomen w/ and w/o contrast 06/03/2020:  1. Area of concern on prior chest CT represents geographic fat intensification on a background of global mild hepatic steatosis. No suspicious lesion in the liver with lobular contours. Correlate with any clinical or laboratory evidence of liver disease. 2. Small focal outpouching approximately 7 x 7 mm along the distal thoracic aorta may represent a penetrating atherosclerotic ulcer not associated with acute features and unchanged since previous chest CT. Consider CT angiogram follow-up in 1 year with vascular surgery consultation to guide further follow-up. 3. Large hiatal hernia extending into the chest as on the recent CT of the chest. 4. Compression fracture at T11 better demonstrated on recent chest CT. 5. Aortic atherosclerosis.   CT chest lung cancer screening: 04/25/2020: Mild coronary atherosclerosis of the LAD.  Risk Assessment/Calculations:   NA   Labs:       Latest Ref Rng & Units 07/06/2024   12:27 PM 07/06/2024   12:17 PM  06/29/2024    4:28 AM  CBC  WBC 3.4 - 10.8 x10E3/uL 5.3  6.0  6.9   Hemoglobin 11.1 - 15.9 g/dL 85.9  85.7  85.8   Hematocrit 34.0 - 46.6 % 44.0  44.0  41.7   Platelets 150 - 450 x10E3/uL 336  343  229        Latest Ref Rng & Units 07/13/2024   10:05 AM 07/06/2024   12:27 PM 07/06/2024   12:17 PM  BMP  Glucose 70 - 99 mg/dL 98  896  892   BUN 8 - 27 mg/dL 9  11  10    Creatinine 0.57 - 1.00 mg/dL 9.18  9.11  9.04   BUN/Creat Ratio 12 - 28 11  13  11    Sodium 134 - 144 mmol/L 141  140  141   Potassium 3.5 - 5.2 mmol/L 4.3  4.0  4.3   Chloride 96 - 106 mmol/L 104  98  99   CO2 20 - 29 mmol/L 22  23  25    Calcium  8.7 - 10.3 mg/dL 89.7  89.8  89.8       Latest Ref Rng & Units 07/13/2024   10:05 AM 07/06/2024   12:27 PM 07/06/2024   12:17 PM  CMP  Glucose 70 - 99 mg/dL 98  896  892   BUN 8 - 27 mg/dL 9  11  10    Creatinine 0.57 - 1.00 mg/dL 9.18  9.11  9.04   Sodium 134 - 144 mmol/L 141  140  141   Potassium 3.5 - 5.2 mmol/L 4.3  4.0  4.3   Chloride 96 - 106 mmol/L 104  98  99   CO2 20 - 29 mmol/L 22  23  25    Calcium  8.7 - 10.3 mg/dL 89.7  89.8  89.8   Total Protein 6.0 - 8.5 g/dL  7.5  7.4   Total Bilirubin 0.0 - 1.2 mg/dL  0.6  0.6   Alkaline Phos 44 - 121 IU/L  81  80   AST 0 - 40 IU/L  16  15   ALT 0 - 32 IU/L  10  10     Lab Results  Component Value Date   CHOL 142 06/28/2024   HDL 64 06/28/2024   LDLCALC 53 06/28/2024   LDLDIRECT 83 07/07/2021   TRIG 125 06/28/2024   CHOLHDL 2.2 06/28/2024   No results for input(s): LIPOA in the last 8760 hours. No components found for: NTPROBNP No results for input(s): PROBNP in the last 8760 hours. No results for input(s): TSH in the last 8760 hours.  Physical Exam:    Today's Vitals   09/24/24 1344  BP: (!) 162/84  Pulse: 69  SpO2: 96%  Weight: 134 lb (60.8 kg)  Height: 5' 1 (1.549 m)    Body mass index is 25.32 kg/m. Wt Readings from Last 3 Encounters:  09/24/24 134 lb (60.8 kg)  07/13/24 132 lb (59.9  kg)  07/06/24 138 lb (62.6 kg)    Physical Exam  Constitutional: No distress.  Age appropriate, hemodynamically stable, walks with cane  Neck: No JVD present.  Cardiovascular: Normal rate, regular rhythm, S1 normal, S2 normal and intact distal pulses. Exam reveals no gallop, no S3 and no S4.  No murmur heard. Pulses:      Dorsalis pedis pulses are 1+ on the right side and 1+ on the left side.       Posterior tibial pulses are 1+ on the right side and 1+ on the left side.  Pulmonary/Chest: Effort normal and breath sounds normal. No stridor. She has no wheezes. She has no rales.  Right clavicle fracture.   Abdominal: Soft. Bowel sounds are normal. She exhibits no distension. There is no abdominal tenderness.  Musculoskeletal:        General: No edema.     Cervical back: Neck supple.  Neurological: She is alert and oriented to person, place, and time. She has intact cranial nerves (2-12).  Skin: Skin is warm and moist.   Impression & Recommendation(s):  Impression:   ICD-10-CM   1. Coronary artery disease involving native coronary artery of native heart without angina pectoris  I25.10     2. Essential hypertension  I10     3. Mixed hyperlipidemia  E78.2     4. Aortic atherosclerosis  I70.0     5. Atherosclerosis of both carotid arteries  I65.23 VAS US  CAROTID    6. Cigarette smoker  F17.210         Recommendation(s):  Coronary artery disease involving native coronary artery of native heart without angina pectoris Aortic atherosclerosis Denies anginal chest pain or heart failure symptoms. After recent hospitalization for repeat echocardiogram notes resolution of her cardiomyopathy, LVEF is now preserved. Recent left heart catheterization notes no significant obstructive disease. Continue aspirin  81 mg p.o. daily. Continue carvedilol  3.125 mg p.o. twice daily. Continue Zetia  10 mg p.o. daily. Increase Benicar  to 20 mg tablet daily, she was on 10mg  po qday.  Continue  pravastatin  40 mg p.o. daily Reemphasized importance of complete smoking cessation  Preop cardiovascular exam  Patient plans to undergo right clavicle surgery Date of the surgery is to determine. Recent echo and left heart catheterization results reviewed. No additional workup is warranted. From a cardiovascular standpoint patient is optimized and no additional workup is warranted. She can hold aspirin  for at least 7 days prior to surgery if needed, would recommend restarting postsurgery once cleared by her surgeon. If there is change in clinical status between now and the surgery patient is advised to be evaluated. Patient is acceptable for cardiovascular risk for upcoming noncardiac surgery  Essential hypertension Home blood pressures are well-controlled with SBP ranging between 125-130 mmHg. Medications as discussed above  Mixed hyperlipidemia Continue pravastatin  40 mg p.o. nightly and Zetia  10 mg p.o. daily  Atherosclerosis of both carotid arteries History of right ICA stenosis. Follow-up carotid duplex have noted improvement in overall disease burden. Continue antiplatelet and lipid-lowering agent Will order repeat duplex in 09/2025  Cigarette smoker Tobacco cessation counseling: Currently smoking 1 cigarette/day. She is informed of the dangers of tobacco abuse including stroke, cancer, and MI, as well as benefits of tobacco cessation. She is willing to quit at this time. 5 mins were spent counseling patient cessation techniques. We discussed various methods to help quit smoking, including deciding on a date to quit, joining a support group, pharmacological agents- nicotine gum/patch/lozenges.  I will reassess her progress at the next follow-up visit  Orders Placed:  No orders of the defined types were placed in this encounter.    Final Medication List:    Meds ordered this encounter  Medications   olmesartan  (BENICAR ) 20 MG tablet    Sig: Take 1 tablet (20 mg total)  by mouth daily.    Dispense:  90 tablet    Refill:  3    Medications Discontinued During This Encounter  Medication Reason   olmesartan  (BENICAR ) 20 MG tablet Dose change      Current Outpatient Medications:    ALPRAZolam  (XANAX ) 0.5 MG tablet, Take 1 tablet (0.5 mg total) by mouth 2 (two) times daily as needed for anxiety., Disp: 60 tablet, Rfl: 2   aspirin  EC 81 MG tablet, TAKE 1 PO BID THRU 05/13/24 THEN RESUME 1 PO DAILY, Disp: , Rfl:    buPROPion  (WELLBUTRIN  XL) 150 MG 24 hr tablet, Take 1 tablet (150 mg total) by mouth daily., Disp: 30 tablet, Rfl: 2   carvedilol  (COREG ) 3.125 MG tablet, Take 1 tablet (3.125 mg total) by mouth 2 (two) times daily with a meal., Disp: 60 tablet, Rfl: 2   citalopram  (CELEXA ) 40 MG tablet, Take 1 tablet (40 mg total) by mouth daily., Disp: 90 tablet, Rfl: 1   cyanocobalamin  (VITAMIN B12) 1000 MCG/ML injection, INJECT 1 ML IM EVERY 28 DAYS, Disp: 1 mL, Rfl: 6   docusate sodium  (COLACE) 100 MG capsule, Take 1 capsule (100 mg total) by mouth 2 (two) times daily., Disp: , Rfl:    ergocalciferol  (VITAMIN D2) 1.25 MG (50000 UT) capsule, Take 1 capsule (50,000 Units total) by mouth every 14 (fourteen) days., Disp: 12 capsule, Rfl: 1   ezetimibe  (ZETIA ) 10 MG tablet, Take 10 mg by mouth daily., Disp: , Rfl:    feeding supplement (ENSURE ENLIVE / ENSURE PLUS) LIQD, Take 237 mLs by mouth 2 (two) times daily between meals., Disp: , Rfl:    folic acid  (FOLVITE ) 1 MG tablet, Take 1 mg by mouth every other day., Disp: , Rfl:    furosemide  (LASIX ) 20 MG tablet, Take 1 tablet (20 mg total) by mouth  daily as needed (for swelling/shortness of breath)., Disp: 30 tablet, Rfl: 0   methocarbamol  (ROBAXIN ) 500 MG tablet, Take 1 tablet (500 mg total) by mouth 3 (three) times daily., Disp: 60 tablet, Rfl: 1   Multiple Vitamin (MULTIVITAMIN WITH MINERALS) TABS tablet, Take 1 tablet by mouth daily., Disp: , Rfl:    olmesartan  (BENICAR ) 20 MG tablet, Take 1 tablet (20 mg total) by  mouth daily., Disp: 90 tablet, Rfl: 3   ondansetron  (ZOFRAN ) 4 MG tablet, Take 1 tablet (4 mg total) by mouth every 8 (eight) hours as needed for nausea or vomiting., Disp: 16 tablet, Rfl: 0   pantoprazole  (PROTONIX ) 40 MG tablet, Take 1 tablet (40 mg total) by mouth 2 (two) times daily before a meal., Disp: 180 tablet, Rfl: 3   polyethylene glycol (MIRALAX  / GLYCOLAX ) 17 g packet, Take 17 g by mouth daily as needed for mild constipation., Disp: , Rfl:    pravastatin  (PRAVACHOL ) 40 MG tablet, Take 1 tablet (40 mg total) by mouth every evening., Disp: 180 tablet, Rfl: 3  Consent:   NA  Disposition:   November 2026  Her questions and concerns were addressed to her satisfaction. She voices understanding of the recommendations provided during this encounter.    Signed, Madonna Michele HAS, [facility]  A Division of [facility] [address], [place], [place] [address]

## 2024-09-29 ENCOUNTER — Encounter: Payer: Self-pay | Admitting: Cardiology

## 2024-10-01 ENCOUNTER — Ambulatory Visit (HOSPITAL_COMMUNITY)
Admission: RE | Admit: 2024-10-01 | Discharge: 2024-10-01 | Disposition: A | Discharge status: Home / Self Care | Source: Ambulatory Visit | Attending: Physician Assistant | Admitting: Physician Assistant

## 2024-10-01 DIAGNOSIS — Z122 Encounter for screening for malignant neoplasm of respiratory organs: Secondary | ICD-10-CM | POA: Insufficient documentation

## 2024-10-01 DIAGNOSIS — F17210 Nicotine dependence, cigarettes, uncomplicated: Secondary | ICD-10-CM | POA: Diagnosis not present

## 2024-10-01 DIAGNOSIS — Z87891 Personal history of nicotine dependence: Secondary | ICD-10-CM | POA: Insufficient documentation

## 2024-10-02 ENCOUNTER — Other Ambulatory Visit (INDEPENDENT_AMBULATORY_CARE_PROVIDER_SITE_OTHER): Payer: Self-pay

## 2024-10-02 ENCOUNTER — Ambulatory Visit: Admitting: Orthopedic Surgery

## 2024-10-02 ENCOUNTER — Encounter: Payer: Self-pay | Admitting: Orthopedic Surgery

## 2024-10-02 VITALS — BP 135/81 | HR 60 | Ht 61.0 in | Wt 138.0 lb

## 2024-10-02 DIAGNOSIS — S42021K Displaced fracture of shaft of right clavicle, subsequent encounter for fracture with nonunion: Secondary | ICD-10-CM | POA: Diagnosis not present

## 2024-10-02 DIAGNOSIS — M25551 Pain in right hip: Secondary | ICD-10-CM

## 2024-10-02 MED ORDER — TRAMADOL HCL 50 MG PO TABS: 50.0000 mg | ORAL_TABLET | Freq: Four times a day (QID) | ORAL | 0 refills | Status: DC | PRN

## 2024-10-02 NOTE — Patient Instructions (Signed)
 Preoperative Instructions  Your surgery will be at [facility], scheduled with Dr Oneil Horde   The hospital will contact you with a preoperative appointment to discuss Anesthesia. The phone number is [phone]   Please bring your medications with you for the appointment.  They will tell you the arrival time and medication instructions when you have your preoperative evaluation.  Do not wear nail polish the day of your surgery and if you take Phentermine you need to stop this medication ONE WEEK prior to your surgery.    If you take an blood thinning medication, we will need to stop this prior to surgery.  Typically, we stop this medicine at least 5 days prior to surgery.  We will need to confirm this with the doctor who prescribes this medication.  If you are taking medications or an injection for diabetes, or for weight management, this medicine will need to be stopped at least 7 days prior to surgery.     Surgery will be scheduled for 10/29/2024 pending authorization by your insurance company.   Pain medicine policy:  Per [facility] clinic policy, our goal is ensure optimal postoperative pain control with a multimodal pain management strategy.   For all [facility] patients, our goal is to wean post-operative narcotic medications by 6 weeks post-operatively.   If this is not possible due to utilization of pain medication prior to surgery, your [facility] doctor will support your acute post-operative pain control for the first 6 weeks postoperatively, with a plan to transition you back to your primary pain team following that.   Mar[facility]e will work to ensure a Therapist, occupational.

## 2024-10-02 NOTE — Progress Notes (Signed)
 Orthopaedic Clinic Return  Assessment: Jody Wilson is a 73 y.o. female with the following: 1.  Right clavicle shaft fracture, nonunion  Plan: Jody Wilson sustained a right clavicle fracture, which has gone on to nonunion.  She continues to have difficulty with pain in her shoulder.  She is interested in scheduling surgery.  We previously discussed the procedure.  All questions have been answered.  Risks and benefits have been discussed.  She would like to proceed with surgery October 29, 2024.  Regarding her right hip.  Repeat radiographs today demonstrates no change in overall alignment.  This recent pain could represent some irritation of the hip joint.  Anticipate that this will gradually resolve.  The pain is not severe enough at this point that she would consider any type of revision surgery.   Follow-up: Return for After surgery.   Subjective:  Chief Complaint  Patient presents with   Clavicle Injury    Has fracture clavicle right / wants to discuss surgery    Hip Pain    Right hip surgery / Hemiarthroplasty / 02/12/24 when pivoting/ turning has a catch uncomfortable     History of Present Illness: Jody Wilson is a 73 y.o. female who returns to clinic for evaluation of right shoulder pain.  She sustained a right clavicle fracture approximately 1 year ago.  This was treated without surgery.  She continues to have pain.  It affects her sleep.  She has difficulty with overhead motion.  She has noticed some radiating pains proximally and distally from the right shoulder area.  Recently, she started to have more pain in the right hip.  She is complaining of groin pain.  When she twists she notes some sharp pains.  This resolves quickly, but it has been happening more recently.   Review of Systems: No fevers or chills No numbness or tingling No chest pain No shortness of breath No bowel or bladder dysfunction No GI distress No headaches   Objective: BP 135/81    Pulse 60   Ht 5' 1 (1.549 m)   Wt 138 lb (62.6 kg)   BMI 26.07 kg/m   Physical Exam:  Alert and oriented.  No acute distress.    Evaluation of right shoulder demonstrates a deformity over the mid clavicle.  There is some motion with compression of the proximal fragment.  Mild tenderness to palpation.  Sensation intact in her hand.  She is able to get her arm beyond 90 degrees of forward flexion, but notes sharp pains in the shoulder.  Right hip without deformity.  Surgical incisions have healed.  No surrounding erythema or drainage.  She tolerates gentle range of motion of right hip without discomfort.  No pain with internal or external rotation.  IMAGING: I personally ordered and reviewed the following images:  AP pelvis and right hip x-rays were obtained in clinic today.  These are compared to prior x-rays.  Well-positioned left hip hemiarthroplasty, without acute injury.  Additional views of the right hip were available, and demonstrates a well-positioned right hip hemiarthroplasty.  No subsidence.  No fractures.  No acute injuries.  The hip is reduced.  No signs of excessive wear within the right acetabulum.  No subchondral cyst formation.  No subchondral sclerosis.  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  [name], MD 10/02/2024 2:31 PM

## 2024-10-03 ENCOUNTER — Observation Stay (HOSPITAL_COMMUNITY)
Admission: EM | Admit: 2024-10-03 | Discharge: 2024-10-10 | Disposition: A | Discharge status: Home / Self Care | Source: Ambulatory Visit | Attending: Internal Medicine | Admitting: Internal Medicine

## 2024-10-03 ENCOUNTER — Other Ambulatory Visit: Payer: Self-pay

## 2024-10-03 ENCOUNTER — Encounter (HOSPITAL_COMMUNITY): Payer: Self-pay

## 2024-10-03 ENCOUNTER — Telehealth: Payer: Self-pay | Admitting: Orthopedic Surgery

## 2024-10-03 ENCOUNTER — Emergency Department (HOSPITAL_COMMUNITY)

## 2024-10-03 ENCOUNTER — Observation Stay (HOSPITAL_COMMUNITY)

## 2024-10-03 DIAGNOSIS — I10 Essential (primary) hypertension: Secondary | ICD-10-CM | POA: Diagnosis present

## 2024-10-03 DIAGNOSIS — W010XXA Fall on same level from slipping, tripping and stumbling without subsequent striking against object, initial encounter: Secondary | ICD-10-CM | POA: Insufficient documentation

## 2024-10-03 DIAGNOSIS — J9601 Acute respiratory failure with hypoxia: Secondary | ICD-10-CM | POA: Insufficient documentation

## 2024-10-03 DIAGNOSIS — I700 Atherosclerosis of aorta: Secondary | ICD-10-CM | POA: Insufficient documentation

## 2024-10-03 DIAGNOSIS — S42251A Displaced fracture of greater tuberosity of right humerus, initial encounter for closed fracture: Secondary | ICD-10-CM | POA: Diagnosis not present

## 2024-10-03 DIAGNOSIS — K219 Gastro-esophageal reflux disease without esophagitis: Secondary | ICD-10-CM | POA: Insufficient documentation

## 2024-10-03 DIAGNOSIS — S42201A Unspecified fracture of upper end of right humerus, initial encounter for closed fracture: Principal | ICD-10-CM | POA: Insufficient documentation

## 2024-10-03 DIAGNOSIS — M25511 Pain in right shoulder: Secondary | ICD-10-CM | POA: Diagnosis present

## 2024-10-03 DIAGNOSIS — Z87891 Personal history of nicotine dependence: Secondary | ICD-10-CM | POA: Insufficient documentation

## 2024-10-03 DIAGNOSIS — M810 Age-related osteoporosis without current pathological fracture: Secondary | ICD-10-CM | POA: Insufficient documentation

## 2024-10-03 DIAGNOSIS — S32591A Other specified fracture of right pubis, initial encounter for closed fracture: Secondary | ICD-10-CM | POA: Insufficient documentation

## 2024-10-03 DIAGNOSIS — S3210XA Unspecified fracture of sacrum, initial encounter for closed fracture: Secondary | ICD-10-CM | POA: Diagnosis not present

## 2024-10-03 DIAGNOSIS — R7401 Elevation of levels of liver transaminase levels: Secondary | ICD-10-CM | POA: Insufficient documentation

## 2024-10-03 DIAGNOSIS — R7989 Other specified abnormal findings of blood chemistry: Secondary | ICD-10-CM | POA: Insufficient documentation

## 2024-10-03 DIAGNOSIS — E781 Pure hyperglyceridemia: Secondary | ICD-10-CM | POA: Diagnosis not present

## 2024-10-03 DIAGNOSIS — S32511A Fracture of superior rim of right pubis, initial encounter for closed fracture: Secondary | ICD-10-CM | POA: Diagnosis not present

## 2024-10-03 DIAGNOSIS — K449 Diaphragmatic hernia without obstruction or gangrene: Secondary | ICD-10-CM | POA: Insufficient documentation

## 2024-10-03 DIAGNOSIS — F419 Anxiety disorder, unspecified: Secondary | ICD-10-CM | POA: Diagnosis present

## 2024-10-03 DIAGNOSIS — Z96643 Presence of artificial hip joint, bilateral: Secondary | ICD-10-CM | POA: Diagnosis not present

## 2024-10-03 DIAGNOSIS — R296 Repeated falls: Secondary | ICD-10-CM | POA: Insufficient documentation

## 2024-10-03 DIAGNOSIS — S42291A Other displaced fracture of upper end of right humerus, initial encounter for closed fracture: Secondary | ICD-10-CM

## 2024-10-03 DIAGNOSIS — F32A Depression, unspecified: Secondary | ICD-10-CM | POA: Diagnosis not present

## 2024-10-03 DIAGNOSIS — S42021A Displaced fracture of shaft of right clavicle, initial encounter for closed fracture: Secondary | ICD-10-CM | POA: Diagnosis not present

## 2024-10-03 DIAGNOSIS — S129XXA Fracture of neck, unspecified, initial encounter: Secondary | ICD-10-CM | POA: Diagnosis not present

## 2024-10-03 DIAGNOSIS — W19XXXA Unspecified fall, initial encounter: Principal | ICD-10-CM | POA: Insufficient documentation

## 2024-10-03 DIAGNOSIS — Z7982 Long term (current) use of aspirin: Secondary | ICD-10-CM | POA: Insufficient documentation

## 2024-10-03 DIAGNOSIS — E785 Hyperlipidemia, unspecified: Secondary | ICD-10-CM | POA: Diagnosis not present

## 2024-10-03 DIAGNOSIS — Y9301 Activity, walking, marching and hiking: Secondary | ICD-10-CM | POA: Diagnosis not present

## 2024-10-03 DIAGNOSIS — Z79899 Other long term (current) drug therapy: Secondary | ICD-10-CM | POA: Diagnosis not present

## 2024-10-03 DIAGNOSIS — R001 Bradycardia, unspecified: Secondary | ICD-10-CM | POA: Diagnosis not present

## 2024-10-03 DIAGNOSIS — M25551 Pain in right hip: Secondary | ICD-10-CM | POA: Diagnosis present

## 2024-10-03 LAB — CBC WITH DIFFERENTIAL/PLATELET
Abs Immature Granulocytes: 0.05 K/uL (ref 0.00–0.07)
Basophils Absolute: 0 K/uL (ref 0.0–0.1)
Basophils Relative: 0 %
Eosinophils Absolute: 0.1 K/uL (ref 0.0–0.5)
Eosinophils Relative: 1 %
HCT: 43.9 % (ref 36.0–46.0)
Hemoglobin: 14.6 g/dL (ref 12.0–15.0)
Immature Granulocytes: 0 %
Lymphocytes Relative: 13 %
Lymphs Abs: 1.5 K/uL (ref 0.7–4.0)
MCH: 32.4 pg (ref 26.0–34.0)
MCHC: 33.3 g/dL (ref 30.0–36.0)
MCV: 97.6 fL (ref 80.0–100.0)
Monocytes Absolute: 0.6 K/uL (ref 0.1–1.0)
Monocytes Relative: 6 %
Neutro Abs: 8.9 K/uL — ABNORMAL HIGH (ref 1.7–7.7)
Neutrophils Relative %: 80 %
Platelets: 214 K/uL (ref 150–400)
RBC: 4.5 MIL/uL (ref 3.87–5.11)
RDW: 13.9 % (ref 11.5–15.5)
WBC: 11.2 K/uL — ABNORMAL HIGH (ref 4.0–10.5)
nRBC: 0 % (ref 0.0–0.2)

## 2024-10-03 LAB — HEPATIC FUNCTION PANEL
ALT: 18 U/L (ref 0–44)
AST: 22 U/L (ref 15–41)
Albumin: 3.9 g/dL (ref 3.5–5.0)
Alkaline Phosphatase: 71 U/L (ref 38–126)
Bilirubin, Direct: 0.2 mg/dL (ref 0.0–0.2)
Indirect Bilirubin: 0.4 mg/dL (ref 0.3–0.9)
Total Bilirubin: 0.7 mg/dL (ref 0.0–1.2)
Total Protein: 6.5 g/dL (ref 6.5–8.1)

## 2024-10-03 LAB — BASIC METABOLIC PANEL WITH GFR
Anion gap: 8 (ref 5–15)
BUN: 12 mg/dL (ref 8–23)
CO2: 27 mmol/L (ref 22–32)
Calcium: 9.1 mg/dL (ref 8.9–10.3)
Chloride: 105 mmol/L (ref 98–111)
Creatinine, Ser: 0.65 mg/dL (ref 0.44–1.00)
GFR, Estimated: >60 mL/min (ref >60)
Glucose, Bld: 97 mg/dL (ref 70–99)
Potassium: 4.4 mmol/L (ref 3.5–5.1)
Sodium: 141 mmol/L (ref 135–145)

## 2024-10-03 MED ORDER — POLYETHYLENE GLYCOL 3350 17 G PO PACK
17.0000 g | PACK | Freq: Every day | ORAL | Status: DC | PRN
  Administered 2024-10-08 – 2024-10-09 (×2): 17 g via ORAL
  Filled 2024-10-03 (×2): qty 1

## 2024-10-03 MED ORDER — CITALOPRAM HYDROBROMIDE 20 MG PO TABS
40.0000 mg | ORAL_TABLET | Freq: Every day | ORAL | Status: DC
Start: 2024-10-04 — End: 2024-10-08
  Administered 2024-10-04 – 2024-10-08 (×5): 40 mg via ORAL
  Filled 2024-10-03 (×5): qty 2

## 2024-10-03 MED ORDER — IRBESARTAN 150 MG PO TABS
150.0000 mg | ORAL_TABLET | Freq: Every day | ORAL | Status: DC
  Administered 2024-10-04 – 2024-10-10 (×5): 150 mg via ORAL
  Filled 2024-10-03 (×7): qty 1

## 2024-10-03 MED ORDER — BUPROPION HCL ER (XL) 150 MG PO TB24
150.0000 mg | ORAL_TABLET | Freq: Every day | ORAL | Status: DC
  Administered 2024-10-04 – 2024-10-10 (×7): 150 mg via ORAL
  Filled 2024-10-03 (×7): qty 1

## 2024-10-03 MED ORDER — OXYCODONE HCL 5 MG PO TABS
5.0000 mg | ORAL_TABLET | Freq: Once | ORAL | Status: AC
  Administered 2024-10-03: 5 mg via ORAL
  Filled 2024-10-03: qty 1

## 2024-10-03 MED ORDER — ASPIRIN 81 MG PO TBEC
81.0000 mg | DELAYED_RELEASE_TABLET | Freq: Every day | ORAL | Status: DC
  Administered 2024-10-04 – 2024-10-10 (×7): 81 mg via ORAL
  Filled 2024-10-03 (×7): qty 1

## 2024-10-03 MED ORDER — ACETAMINOPHEN 325 MG PO TABS
650.0000 mg | ORAL_TABLET | Freq: Four times a day (QID) | ORAL | Status: DC | PRN
  Administered 2024-10-04 – 2024-10-07 (×3): 650 mg via ORAL
  Filled 2024-10-03 (×3): qty 2

## 2024-10-03 MED ORDER — ONDANSETRON HCL 4 MG/2ML IJ SOLN: 4.0000 mg | Freq: Four times a day (QID) | INTRAMUSCULAR | Status: DC | PRN

## 2024-10-03 MED ORDER — ACETAMINOPHEN 650 MG RE SUPP: 650.0000 mg | Freq: Four times a day (QID) | RECTAL | Status: DC | PRN

## 2024-10-03 MED ORDER — ORAL CARE MOUTH RINSE
15.0000 mL | OROMUCOSAL | Status: DC | PRN
Start: 2024-10-03 — End: 2024-10-10

## 2024-10-03 MED ORDER — CARVEDILOL 3.125 MG PO TABS
3.1250 mg | ORAL_TABLET | Freq: Two times a day (BID) | ORAL | Status: DC
  Filled 2024-10-03: qty 1

## 2024-10-03 MED ORDER — PANTOPRAZOLE SODIUM 40 MG PO TBEC
40.0000 mg | DELAYED_RELEASE_TABLET | Freq: Two times a day (BID) | ORAL | Status: DC
  Administered 2024-10-03 – 2024-10-10 (×14): 40 mg via ORAL
  Filled 2024-10-03 (×14): qty 1

## 2024-10-03 MED ORDER — HYDROCODONE-ACETAMINOPHEN 5-325 MG PO TABS
1.0000 | ORAL_TABLET | Freq: Once | ORAL | Status: AC
  Administered 2024-10-03: 1 via ORAL
  Filled 2024-10-03 (×2): qty 1

## 2024-10-03 MED ORDER — ONDANSETRON HCL 4 MG PO TABS: 4.0000 mg | ORAL_TABLET | Freq: Four times a day (QID) | ORAL | Status: DC | PRN

## 2024-10-03 MED ORDER — OXYCODONE HCL 5 MG PO TABS
5.0000 mg | ORAL_TABLET | ORAL | Status: DC | PRN
  Administered 2024-10-04 – 2024-10-10 (×26): 5 mg via ORAL
  Filled 2024-10-03 (×26): qty 1

## 2024-10-03 MED ORDER — ALPRAZOLAM 0.5 MG PO TABS
0.5000 mg | ORAL_TABLET | Freq: Two times a day (BID) | ORAL | Status: DC | PRN
Start: 2024-10-03 — End: 2024-10-10
  Administered 2024-10-04 – 2024-10-10 (×7): 0.5 mg via ORAL
  Filled 2024-10-03 (×7): qty 1

## 2024-10-03 MED ORDER — KETOROLAC TROMETHAMINE 15 MG/ML IJ SOLN
15.0000 mg | Freq: Four times a day (QID) | INTRAMUSCULAR | Status: AC
  Administered 2024-10-03 (×2): 15 mg via INTRAVENOUS
  Filled 2024-10-03 (×2): qty 1

## 2024-10-03 MED ORDER — PRAVASTATIN SODIUM 40 MG PO TABS: 40.0000 mg | ORAL_TABLET | Freq: Every day | ORAL | Status: DC

## 2024-10-03 NOTE — Progress Notes (Signed)
 Orthopedic Tech Progress Note Patient Details:  [name] April 16, 1951 [id number]  Ortho Devices Type of Ortho Device: Sling immobilizer Ortho Device/Splint Location: right Ortho Device/Splint Interventions: Ordered, Application, Adjustment   Post Interventions Patient Tolerated: Well Instructions Provided: Adjustment of device, Care of device  [name] 10/03/2024, 12:15 PM

## 2024-10-03 NOTE — Plan of Care (Signed)
  Problem: Education: Goal: Knowledge of General Education information will improve Description: Including pain rating scale, medication(s)/side effects and non-pharmacologic comfort measures Outcome: Progressing   Problem: Nutrition: Goal: Adequate nutrition will be maintained Outcome: Progressing   Problem: Pain Managment: Goal: General experience of comfort will improve and/or be controlled Outcome: Progressing

## 2024-10-03 NOTE — Telephone Encounter (Signed)
 Dr. Onesimo pt - spoke w/the pt's grandson Ester [phone], he stated that the pt was visiting at [facility] and fell, thinks she messed up her rt clavicle more, is in the ED right now.  FYI

## 2024-10-03 NOTE — Consult Note (Signed)
 Reason for Consult:Polytrauma Referring Physician: Maude Galloway Time called: 1348 Time at bedside: 1355   Jody Wilson is an 73 y.o. female.  HPI: Jody Wilson tripped and fell while visiting a pt at [facility]. She had immediate right shoulder pain and right hip pain. She was brought to the ED where x-rays showed a humerus and a pubic ramus fx and orthopedic surgery was consulted. She lives at home with her husband and does not use any assistive devices to ambulate. She is RHD.  Past Medical History:  Diagnosis Date   Anemia    Anxiety    Aortic atherosclerosis    Carotid artery disease    Depression    GERD (gastroesophageal reflux disease)    Hyperlipidemia    Hypertension    Orthostatic hypotension    Osteoporosis     Past Surgical History:  Procedure Laterality Date   ABDOMINAL HYSTERECTOMY     BACK SURGERY     CHOLECYSTECTOMY     FOOT SURGERY     7 in the past   HIP ARTHROPLASTY Right 02/11/2023   Procedure: ARTHROPLASTY BIPOLAR HIP (HEMIARTHROPLASTY);  Surgeon: Onesimo Oneil LABOR, MD;  Location: [facility];  Service: Orthopedics;  Laterality: Right;   HIP ARTHROPLASTY Left 04/13/2024   Procedure: HEMIARTHROPLASTY (BIPOLAR) HIP, DIRECT LATERAL  FOR FRACTURE;  Surgeon: Margrette Taft BRAVO, MD;  Location: [facility];  Service: Orthopedics;  Laterality: Left;   IR GENERIC HISTORICAL  12/09/2016   IR RADIOLOGIST EVAL & MGMT 12/09/2016 [facility]   IR GENERIC HISTORICAL  12/10/2016   IR VERTEBROPLASTY CERV/THOR BX INC UNI/BIL INC/INJECT/IMAGING 12/10/2016 [facility]   LEFT HEART CATH AND CORONARY ANGIOGRAPHY N/A 09/30/2020   Procedure: LEFT HEART CATH AND CORONARY ANGIOGRAPHY;  Surgeon: Elmira Newman PARAS, MD;  Location: [facility];  Service: Cardiovascular;  Laterality: N/A;   LEFT HEART CATH AND CORONARY ANGIOGRAPHY N/A 06/27/2024   Procedure: LEFT HEART CATH AND CORONARY ANGIOGRAPHY;  Surgeon: Verlin Lonni BIRCH, MD;  Location: [facility];  Service: Cardiovascular;   Laterality: N/A;   TRACHEOSTOMY TUBE PLACEMENT  1982    Family History  Problem Relation Age of Onset   Heart disease Mother    Hypertension Mother    Hyperlipidemia Mother    Diabetes Mother    Congestive Heart Failure Mother    Aneurysm Paternal Uncle    Heart attack Maternal Grandmother    Heart attack Maternal Grandfather    Hypertension Son    Other Son 48       Kienbock's Disease   Liver disease Son 83       Gilbert's Disease   Lupus Half-Sister    Atrial fibrillation Half-Sister    Breast cancer Niece     Social History:  reports that she quit smoking about 19 months ago. Her smoking use included cigarettes. She started smoking about 26 years ago. She has a 6.3 pack-year smoking history. She has never been exposed to tobacco smoke. She has never used smokeless tobacco. She reports that she does not currently use alcohol. She reports that she does not use drugs.  Allergies:  Allergies  Allergen Reactions   Tetracyclines & Related Itching and Swelling    Medications: I have reviewed the patient's current medications.  Results for orders placed or performed during the hospital encounter of 10/03/24 (from the past 48 hours)  Basic metabolic panel     Status: None   Collection Time: 10/03/24  1:18 PM  Result Value Ref Range  Sodium 141 135 - 145 mmol/L   Potassium 4.4 3.5 - 5.1 mmol/L   Chloride 105 98 - 111 mmol/L   CO2 27 22 - 32 mmol/L   Glucose, Bld 97 70 - 99 mg/dL    Comment: Glucose reference range applies only to samples taken after fasting for at least 8 hours.   BUN 12 8 - 23 mg/dL   Creatinine, Ser 9.34 0.44 - 1.00 mg/dL   Calcium  9.1 8.9 - 10.3 mg/dL   GFR, Estimated >39 >39 mL/min    Comment: (NOTE) Calculated using the CKD-EPI Creatinine Equation (2021)    Anion gap 8 5 - 15    Comment: Performed at [facility], [address] [place], [place] [address]  CBC with Differential     Status: Abnormal   Collection Time: 10/03/24   1:18 PM  Result Value Ref Range   WBC 11.2 (H) 4.0 - 10.5 K/uL   RBC 4.50 3.87 - 5.11 MIL/uL   Hemoglobin 14.6 12.0 - 15.0 g/dL   HCT 56.0 63.9 - 53.9 %   MCV 97.6 80.0 - 100.0 fL   MCH 32.4 26.0 - 34.0 pg   MCHC 33.3 30.0 - 36.0 g/dL   RDW 86.0 88.4 - 84.4 %   Platelets 214 150 - 400 K/uL   nRBC 0.0 0.0 - 0.2 %   Neutrophils Relative % 80 %   Neutro Abs 8.9 (H) 1.7 - 7.7 K/uL   Lymphocytes Relative 13 %   Lymphs Abs 1.5 0.7 - 4.0 K/uL   Monocytes Relative 6 %   Monocytes Absolute 0.6 0.1 - 1.0 K/uL   Eosinophils Relative 1 %   Eosinophils Absolute 0.1 0.0 - 0.5 K/uL   Basophils Relative 0 %   Basophils Absolute 0.0 0.0 - 0.1 K/uL   Immature Granulocytes 0 %   Abs Immature Granulocytes 0.05 0.00 - 0.07 K/uL    Comment: Performed at [facility], [address], [place], [place] [address]    DG Chest 2 View Result Date: 10/03/2024 CLINICAL DATA:  Status post fall. EXAM: CHEST - 2 VIEW COMPARISON:  None Available. FINDINGS: The heart size and mediastinal contours are within normal limits. No acute infiltrate, pleural effusion or pneumothorax is identified. There is a large hiatal hernia. Acute fracture deformity is seen involving the head and neck of the proximal right humerus. There is a chronic fracture deformity of the mid right clavicle. Postoperative changes are noted within the cervical spine. IMPRESSION: 1. Acute fracture of the proximal right humerus. 2. Large hiatal hernia. Electronically Signed   By: Suzen Dials M.D.   On: 10/03/2024 11:30   DG Humerus Right Result Date: 10/03/2024 CLINICAL DATA:  Status post fall. EXAM: RIGHT HUMERUS - 2+ VIEW COMPARISON:  None Available. FINDINGS: There is an acute fracture deformity involving the head and neck of the proximal right humerus. There is no evidence of dislocation. A chronic fracture of the mid right clavicle is noted. A chronic fracture of the right radial head is also suspected. Soft tissues are  unremarkable. IMPRESSION: Acute fracture of the proximal right humerus. Electronically Signed   By: Suzen Dials M.D.   On: 10/03/2024 11:29   DG Hip Unilat W or Wo Pelvis 2-3 Views Right Result Date: 10/03/2024 CLINICAL DATA:  Status post fall. EXAM: DG HIP (WITH OR WITHOUT PELVIS) 2-3V*R* COMPARISON:  October 02, 2024 FINDINGS: Intact bilateral hip replacements are seen. Acute fracture is suspected along the right inferior pubic  ramus. This represents a new finding when compared to the prior study a right superior pubic ramus fracture cannot be excluded. There is no evidence of dislocation. IMPRESSION: 1. Acute fracture of the right inferior pubic ramus. Correlation with nonemergent pelvis CT is recommended. 2. Intact bilateral hip replacements. Electronically Signed   By: Suzen Dials M.D.   On: 10/03/2024 11:27   DG Shoulder Right Result Date: 10/03/2024 CLINICAL DATA:  Status post fall. EXAM: RIGHT SHOULDER - 2+ VIEW COMPARISON:  August 13, 2024 FINDINGS: An acute, comminuted fracture deformity is seen involving the head and neck of the proximal right humerus. There is no evidence of dislocation. A chronic deformity of the mid right clavicle is noted. Postoperative changes are seen within the cervical spine. Soft tissues are unremarkable. IMPRESSION: Acute fracture of the proximal right humerus. Electronically Signed   By: Suzen Dials M.D.   On: 10/03/2024 11:24   DG HIP UNILAT W OR W/O PELVIS 2-3 VIEWS RIGHT Result Date: 10/02/2024 [facility] pelvis and right hip x-rays were obtained in clinic today.  These are compared to prior x-rays.  Well-positioned left hip hemiarthroplasty, without acute injury.  Additional views of the right hip were available, and demonstrates a well-positioned right hip hemiarthroplasty.  No subsidence.  No fractures.  No acute injuries.  The hip is reduced.  No signs of excessive wear within the right acetabulum.  No subchondral cyst formation.  No subchondral  sclerosis.  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    Review of Systems  HENT:  Negative for ear discharge, ear pain, hearing loss and tinnitus.   Eyes:  Negative for photophobia and pain.  Respiratory:  Negative for cough and shortness of breath.   Cardiovascular:  Negative for chest pain.  Gastrointestinal:  Negative for abdominal pain, nausea and vomiting.  Genitourinary:  Negative for dysuria, flank pain, frequency and urgency.  Musculoskeletal:  Positive for arthralgias (Right shoulder, right hip). Negative for back pain, myalgias and neck pain.  Neurological:  Negative for dizziness and headaches.  Hematological:  Does not bruise/bleed easily.  Psychiatric/Behavioral:  The patient is not nervous/anxious.    Blood pressure 127/76, pulse (!) 55, temperature 97.6 F (36.4 C), temperature source Oral, resp. rate 17, SpO2 94%. Physical Exam Constitutional:      General: She is not in acute distress.    Appearance: She is well-developed. She is not diaphoretic.  HENT:     Head: Normocephalic and atraumatic.  Eyes:     General: No scleral icterus.       Right eye: No discharge.        Left eye: No discharge.     Conjunctiva/sclera: Conjunctivae normal.  Cardiovascular:     Rate and Rhythm: Normal rate and regular rhythm.  Pulmonary:     Effort: Pulmonary effort is normal. No respiratory distress.  Musculoskeletal:     Cervical back: Normal range of motion.     Comments: Right shoulder, elbow, wrist, digits- no skin wounds, sling in place, no instability, no blocks to motion  Sens  Ax/R/M/U intact  Mot   Ax/ R/ PIN/ M/ AIN/ U intact  Rad 2+  Pelvis--no traumatic wounds or rash, no ecchymosis, stable to manual stress, severe pain with lateral compression, no pain with AP compression  Skin:    General: Skin is warm and dry.  Neurological:     Mental Status: She is alert.  Psychiatric:        Mood and Affect: Mood  normal.         Behavior: Behavior normal.     Assessment/Plan: Right humerus fx -- Plan non-operative management with sling and NWB.  Right inferior pubic ramus fx -- Plan non-operative management with WBAT RLE. Will get CT of both to better define fxs. May f/u with Dr. Beverley in 2 weeks but already a pt of Dr. Onesimo so will likely f/u with him.    Ozell DOROTHA Ned, PA-C Orthopedic Surgery [phone] 10/03/2024, 2:33 PM

## 2024-10-03 NOTE — ED Notes (Addendum)
 Patient verbalized wanting to be discharged today with multiple factures. Attempted to ambulate patient per Dr. Laurice.  After applying the sling, patient was unable to move to sit on the side of bed with assist x1. Patient stated that she was having too much pain and difficulty moving her left leg. Patient went on to state, I can't do this and was assisted back into the stretcher. Dr. Laurice notified of the outcome of this RN's attempt to ambulate patient.  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

## 2024-10-03 NOTE — ED Provider Notes (Signed)
 [place] EMERGENCY DEPARTMENT AT [facility] Provider Note   CSN: [id number] Arrival date & time: 10/03/24  1000     Patient presents with: Jody Wilson is a 73 y.o. female.   72 year old female with prior medical history as detailed below presents for evaluation.  Patient was here with her husband.  Her husband is currently having outpatient studies done at radiology.  She was walking down the hallway and lost her balance and fell.  She landed hard on her right shoulder and right hip.  She complains of pain to the right shoulder and right hip.  She was unable to ambulate after the fall.  The history is provided by the patient and medical records.       Prior to Admission medications   Medication Sig Start Date End Date Taking? Authorizing Provider  ALPRAZolam  (XANAX ) 0.5 MG tablet Take 1 tablet (0.5 mg total) by mouth 2 (two) times daily as needed for anxiety. 07/06/24   Dettinger, Fonda LABOR, MD  aspirin  EC 81 MG tablet TAKE 1 PO BID THRU 05/13/24 THEN RESUME 1 PO DAILY 04/16/24   Johnson, Clanford L, MD  buPROPion  (WELLBUTRIN  XL) 150 MG 24 hr tablet Take 1 tablet (150 mg total) by mouth daily. 06/04/24   Dettinger, Fonda LABOR, MD  carvedilol  (COREG ) 3.125 MG tablet Take 1 tablet (3.125 mg total) by mouth 2 (two) times daily with a meal. 08/08/24   Dettinger, Fonda LABOR, MD  citalopram  (CELEXA ) 40 MG tablet Take 1 tablet (40 mg total) by mouth daily. 06/04/24   Dettinger, Fonda LABOR, MD  cyanocobalamin  (VITAMIN B12) 1000 MCG/ML injection INJECT 1 ML IM EVERY 28 DAYS 10/31/23   Rogers Hai, MD  docusate sodium  (COLACE) 100 MG capsule Take 1 capsule (100 mg total) by mouth 2 (two) times daily. 04/16/24   Johnson, Clanford L, MD  ergocalciferol  (VITAMIN D2) 1.25 MG (50000 UT) capsule Take 1 capsule (50,000 Units total) by mouth every 14 (fourteen) days. 07/10/24   Geofm Delon BRAVO, NP  ezetimibe  (ZETIA ) 10 MG tablet Take 10 mg by mouth daily.    [provider]  feeding supplement (ENSURE ENLIVE / ENSURE PLUS) LIQD Take 237 mLs by mouth 2 (two) times daily between meals. 04/17/24   Johnson, Clanford L, MD  folic acid  (FOLVITE ) 1 MG tablet Take 1 mg by mouth every other day.    [provider]  furosemide  (LASIX ) 20 MG tablet Take 1 tablet (20 mg total) by mouth daily as needed (for swelling/shortness of breath). 06/29/24 06/29/25  Lue Elsie BROCKS, MD  methocarbamol  (ROBAXIN ) 500 MG tablet Take 1 tablet (500 mg total) by mouth 3 (three) times daily. 05/17/24   Margrette Taft BRAVO, MD  Multiple Vitamin (MULTIVITAMIN WITH MINERALS) TABS tablet Take 1 tablet by mouth daily. 04/17/24   Johnson, Clanford L, MD  olmesartan  (BENICAR ) 20 MG tablet Take 1 tablet (20 mg total) by mouth daily. 09/24/24   Tolia, Sunit, DO  ondansetron  (ZOFRAN ) 4 MG tablet Take 1 tablet (4 mg total) by mouth every 8 (eight) hours as needed for nausea or vomiting. 05/08/24   Francesca Elsie CROME, MD  pantoprazole  (PROTONIX ) 40 MG tablet Take 1 tablet (40 mg total) by mouth 2 (two) times daily before a meal. 06/04/24   Dettinger, Fonda LABOR, MD  polyethylene glycol (MIRALAX  / GLYCOLAX ) 17 g packet Take 17 g by mouth daily as needed for mild constipation. 04/16/24   Vicci Afton CROME, MD  pravastatin  (  PRAVACHOL ) 40 MG tablet Take 1 tablet (40 mg total) by mouth every evening. 06/04/24   Dettinger, Fonda LABOR, MD  traMADol  (ULTRAM ) 50 MG tablet Take 1 tablet (50 mg total) by mouth every 6 (six) hours as needed. 10/02/24   Onesimo Oneil LABOR, MD    Allergies: Tetracyclines & related    Review of Systems  All other systems reviewed and are negative.   Updated Vital Signs BP (!) 116/57 (BP Location: Left Arm)   Pulse (!) 51   Temp 97.6 F (36.4 C) (Oral)   Resp (!) 23   SpO2 93%   Physical Exam Vitals and nursing note reviewed.  Constitutional:      General: She is not in acute distress.    Appearance: Normal appearance. She is well-developed.  HENT:     Head: Normocephalic  and atraumatic.  Eyes:     Conjunctiva/sclera: Conjunctivae normal.     Pupils: Pupils are equal, round, and reactive to light.  Cardiovascular:     Rate and Rhythm: Normal rate and regular rhythm.     Heart sounds: Normal heart sounds.  Pulmonary:     Effort: Pulmonary effort is normal. No respiratory distress.     Breath sounds: Normal breath sounds.  Abdominal:     General: There is no distension.     Palpations: Abdomen is soft.     Tenderness: There is no abdominal tenderness.  Musculoskeletal:        General: No deformity. Normal range of motion.     Cervical back: Normal range of motion and neck supple.     Comments: Tender to the anterior and lateral aspects of the right shoulder.  Limited active range of motion of the right shoulder secondary to pain.  She reports pain in the right groin.  Active range of motion of the right hip is limited secondary to pain in the right groin.  Skin:    General: Skin is warm and dry.  Neurological:     General: No focal deficit present.     Mental Status: She is alert and oriented to person, place, and time.     (all labs ordered are listed, but only abnormal results are displayed) Labs Reviewed - No data to display  EKG: None  Radiology: DG HIP UNILAT W OR W/O PELVIS 2-3 VIEWS RIGHT Result Date: 10/02/2024 [facility] pelvis and right hip x-rays were obtained in clinic today.  These are compared to prior x-rays.  Well-positioned left hip hemiarthroplasty, without acute injury.  Additional views of the right hip were available, and demonstrates a well-positioned right hip hemiarthroplasty.  No subsidence.  No fractures.  No acute injuries.  The hip is reduced.  No signs of excessive wear within the right acetabulum.  No subchondral cyst formation.  No subchondral sclerosis.  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     Procedures   Medications Ordered in the ED  HYDROcodone -acetaminophen  (NORCO/VICODIN)  5-325 MG per tablet 1 tablet (has no administration in time range)                                    Medical Decision Making Presents after a level fall here in the hospital.  She injured her right shoulder and right hip with the fall.  Imaging reveals acute right proximal humerus fracture and right inferior pubic rami fracture.  Patient is unable to ambulate after the  fall secondary to pain.  Orthopedics -Dr. Beverley -is aware of case and will consult.  Dr. Celinda is aware of case and will evaluate for admission.  Amount and/or Complexity of Data Reviewed Labs: ordered. Radiology: ordered.  Risk Prescription drug management. Decision regarding hospitalization.        Final diagnoses:  Fall, initial encounter  Closed fracture of proximal end of right humerus, unspecified fracture morphology, initial encounter  Closed fracture of right inferior pubic ramus, initial encounter [facility])    ED Discharge Orders     None          Laurice Maude BROCKS, MD 10/03/24 1401

## 2024-10-03 NOTE — Progress Notes (Signed)
 Plan of Care Note    Patient: Jody Wilson MRN: [id number]   DOA: 10/03/2024  This case was discussed with Dr. Laurice.  He is waiting for orthopedic surgery's input and lab results.  He will notify us  once this is available for the patient's admission.  Author: Alm Dorn Castor, MD 10/03/2024  Check [email or web address] for on-call coverage.  Nursing staff, Please call [facility] Admits & Consults System-Wide number on Amion as soon as patient's arrival, so appropriate admitting provider can evaluate the pt.

## 2024-10-03 NOTE — H&P (Signed)
 History and Physical    Patient: Jody Wilson FMW:[id number] DOB: 21-Oct-1951 DOA: 10/03/2024 DOS: the patient was seen and examined on 10/03/2024 PCP: Dettinger, Fonda LABOR, MD  Patient coming from: Home  Chief Complaint:  Chief Complaint  Patient presents with   Fall   HPI: Jody Wilson is a 73 y.o. female with medical history significant of iron deficiency anemia, depression, aortic atherosclerosis, hyperlipidemia, carotid artery disease, hypertension, orthostatic hypotension, pulmonary nodule, history of smoking in remission, GERD, B12 deficiency, osteoporosis, history of vertebral fracture, history of syncopal episode, right clavicular fracture who is scheduled for right clavicle fracture repair surgery on October 29, 2024 who this morning was accompanying her husband to get a PET scan when she lost her balance, then fell landing on her right shoulder and right hip developing immediate pain. She denied fever, chills, rhinorrhea, sore throat, wheezing or hemoptysis. No chest pain, palpitations, diaphoresis, PND, orthopnea or recent pitting edema of the lower extremities. No abdominal pain, nausea, emesis, diarrhea, constipation, melena or hematochezia. No flank pain, dysuria, frequency or hematuria. No polyuria, polydipsia, polyphagia or blurred vision.   Lab work: CBC showed a white count 11.2, hemoglobin 14.6 g/dL and platelets 785.  BMP and hepatic function panel were normal.  Imaging: Right shoulder and right humerus x-rays showing acute fracture of the proximal right humerus.  Pelvis x-ray showing acute fracture of the right inferior pubic ramus.  Nonemergent pelvis CT recommended.  And intact bilateral hip replacements.  2 view chest radiograph showing normal heart size and mediastinal contours.  No infiltrate effusion or pneumothorax.  There is a large hiatal hernia.   ED course: Initial vital signs were temperature 97.6 F, pulse 51, respiration 23, BP 116/57 mmHg and O2 sat 93%  on room air.  The patient received a tablet of Norco 5/325 mg p.o. in the emergency department.  Review of Systems: As mentioned in the history of present illness. All other systems reviewed and are negative. Past Medical History:  Diagnosis Date   Anemia    Anxiety    Aortic atherosclerosis    Carotid artery disease    Depression    GERD (gastroesophageal reflux disease)    Hyperlipidemia    Hypertension    Orthostatic hypotension    Osteoporosis    Past Surgical History:  Procedure Laterality Date   ABDOMINAL HYSTERECTOMY     BACK SURGERY     CHOLECYSTECTOMY     FOOT SURGERY     7 in the past   HIP ARTHROPLASTY Right 02/11/2023   Procedure: ARTHROPLASTY BIPOLAR HIP (HEMIARTHROPLASTY);  Surgeon: Onesimo Oneil LABOR, MD;  Location: [facility];  Service: Orthopedics;  Laterality: Right;   HIP ARTHROPLASTY Left 04/13/2024   Procedure: HEMIARTHROPLASTY (BIPOLAR) HIP, DIRECT LATERAL  FOR FRACTURE;  Surgeon: Margrette Taft BRAVO, MD;  Location: [facility];  Service: Orthopedics;  Laterality: Left;   IR GENERIC HISTORICAL  12/09/2016   IR RADIOLOGIST EVAL & MGMT 12/09/2016 [facility]   IR GENERIC HISTORICAL  12/10/2016   IR VERTEBROPLASTY CERV/THOR BX INC UNI/BIL INC/INJECT/IMAGING 12/10/2016 [facility]   LEFT HEART CATH AND CORONARY ANGIOGRAPHY N/A 09/30/2020   Procedure: LEFT HEART CATH AND CORONARY ANGIOGRAPHY;  Surgeon: Elmira Newman PARAS, MD;  Location: [facility];  Service: Cardiovascular;  Laterality: N/A;   LEFT HEART CATH AND CORONARY ANGIOGRAPHY N/A 06/27/2024   Procedure: LEFT HEART CATH AND CORONARY ANGIOGRAPHY;  Surgeon: Verlin Lonni BIRCH, MD;  Location: [facility];  Service: Cardiovascular;  Laterality:  N/A;   TRACHEOSTOMY TUBE PLACEMENT  1982   Social History:  reports that she quit smoking about 19 months ago. Her smoking use included cigarettes. She started smoking about 26 years ago. She has a 6.3 pack-year smoking history. She has never been exposed to  tobacco smoke. She has never used smokeless tobacco. She reports that she does not currently use alcohol. She reports that she does not use drugs.  Allergies  Allergen Reactions   Tetracyclines & Related Itching and Swelling    Family History  Problem Relation Age of Onset   Heart disease Mother    Hypertension Mother    Hyperlipidemia Mother    Diabetes Mother    Congestive Heart Failure Mother    Aneurysm Paternal Uncle    Heart attack Maternal Grandmother    Heart attack Maternal Grandfather    Hypertension Son    Other Son 67       Kienbock's Disease   Liver disease Son 87       Gilbert's Disease   Lupus Half-Sister    Atrial fibrillation Half-Sister    Breast cancer Niece     Prior to Admission medications   Medication Sig Start Date End Date Taking? Authorizing Provider  ALPRAZolam  (XANAX ) 0.5 MG tablet Take 1 tablet (0.5 mg total) by mouth 2 (two) times daily as needed for anxiety. 07/06/24   Dettinger, Fonda LABOR, MD  aspirin  EC 81 MG tablet TAKE 1 PO BID THRU 05/13/24 THEN RESUME 1 PO DAILY 04/16/24   Johnson, Clanford L, MD  buPROPion  (WELLBUTRIN  XL) 150 MG 24 hr tablet Take 1 tablet (150 mg total) by mouth daily. 06/04/24   Dettinger, Fonda LABOR, MD  carvedilol  (COREG ) 3.125 MG tablet Take 1 tablet (3.125 mg total) by mouth 2 (two) times daily with a meal. 08/08/24   Dettinger, Fonda LABOR, MD  citalopram  (CELEXA ) 40 MG tablet Take 1 tablet (40 mg total) by mouth daily. 06/04/24   Dettinger, Fonda LABOR, MD  cyanocobalamin  (VITAMIN B12) 1000 MCG/ML injection INJECT 1 ML IM EVERY 28 DAYS 10/31/23   Rogers Hai, MD  docusate sodium  (COLACE) 100 MG capsule Take 1 capsule (100 mg total) by mouth 2 (two) times daily. 04/16/24   Johnson, Clanford L, MD  ergocalciferol  (VITAMIN D2) 1.25 MG (50000 UT) capsule Take 1 capsule (50,000 Units total) by mouth every 14 (fourteen) days. 07/10/24   Geofm Delon BRAVO, NP  ezetimibe  (ZETIA ) 10 MG tablet Take 10 mg by mouth daily.    [provider]  feeding supplement (ENSURE ENLIVE / ENSURE PLUS) LIQD Take 237 mLs by mouth 2 (two) times daily between meals. 04/17/24   Johnson, Clanford L, MD  folic acid  (FOLVITE ) 1 MG tablet Take 1 mg by mouth every other day.    [provider]  furosemide  (LASIX ) 20 MG tablet Take 1 tablet (20 mg total) by mouth daily as needed (for swelling/shortness of breath). 06/29/24 06/29/25  Lue Elsie BROCKS, MD  methocarbamol  (ROBAXIN ) 500 MG tablet Take 1 tablet (500 mg total) by mouth 3 (three) times daily. 05/17/24   Margrette Taft BRAVO, MD  Multiple Vitamin (MULTIVITAMIN WITH MINERALS) TABS tablet Take 1 tablet by mouth daily. 04/17/24   Johnson, Clanford L, MD  olmesartan  (BENICAR ) 20 MG tablet Take 1 tablet (20 mg total) by mouth daily. 09/24/24   Tolia, Sunit, DO  ondansetron  (ZOFRAN ) 4 MG tablet Take 1 tablet (4 mg total) by mouth every 8 (eight) hours as needed for nausea or vomiting.  05/08/24   Francesca Elsie CROME, MD  pantoprazole  (PROTONIX ) 40 MG tablet Take 1 tablet (40 mg total) by mouth 2 (two) times daily before a meal. 06/04/24   Dettinger, Fonda LABOR, MD  polyethylene glycol (MIRALAX  / GLYCOLAX ) 17 g packet Take 17 g by mouth daily as needed for mild constipation. 04/16/24   Johnson, Clanford L, MD  pravastatin  (PRAVACHOL ) 40 MG tablet Take 1 tablet (40 mg total) by mouth every evening. 06/04/24   Dettinger, Fonda LABOR, MD  traMADol  (ULTRAM ) 50 MG tablet Take 1 tablet (50 mg total) by mouth every 6 (six) hours as needed. 10/02/24   Onesimo Oneil LABOR, MD    Physical Exam: Vitals:   10/03/24 1200 10/03/24 1300 10/03/24 1310 10/03/24 1415  BP: (!) 150/73 123/70  127/76  Pulse: (!) 51 (!) 57 (!) 52 (!) 55  Resp: 19 13 18 17   Temp:      TempSrc:      SpO2: 91% (!) 87% 92% 94%   Physical Exam Vitals and nursing note reviewed.  Constitutional:      General: She is awake. She is not in acute distress.    Appearance: Normal appearance. She is ill-appearing.  HENT:     Head:  Normocephalic.     Nose: No rhinorrhea.     Mouth/Throat:     Mouth: Mucous membranes are moist.  Eyes:     General: No scleral icterus.    Pupils: Pupils are equal, round, and reactive to light.  Neck:     Vascular: No JVD.  Cardiovascular:     Rate and Rhythm: Normal rate and regular rhythm.     Heart sounds: S1 normal and S2 normal.  Pulmonary:     Effort: Pulmonary effort is normal.     Breath sounds: Normal breath sounds. No wheezing, rhonchi or rales.  Abdominal:     General: Bowel sounds are normal. There is no distension.     Palpations: Abdomen is soft.     Tenderness: There is no abdominal tenderness. There is no right CVA tenderness or left CVA tenderness.  Musculoskeletal:     Right shoulder: Tenderness present. Decreased range of motion.     Right upper arm: Edema and tenderness present.     Cervical back: Neck supple.     Right lower leg: No edema.     Left lower leg: No edema.     Comments: Right arm sling in place.  Skin:    General: Skin is warm and dry.  Neurological:     General: No focal deficit present.     Mental Status: She is alert and oriented to person, place, and time.  Psychiatric:        Mood and Affect: Mood normal.        Behavior: Behavior normal. Behavior is cooperative.     Data Reviewed:  Results are pending, will review when available. EKG: Vent. rate 50 BPM PR interval 137 ms QRS duration 89 ms QT/QTcB 451/412 ms P-R-T axes 77 71 70 Sinus rhythm  Assessment and Plan: Principal Problem:   Closed fracture of proximal end of right humerus And:   Closed fracture of right inferior pubic ramus (HCC)  Admit to telemetry/inpatient. Ice area as needed. Analgesics as needed. Antiemetics as needed. Consult TOC team. Consult nutritional services. PT evaluation after surgery. Orthopedic surgery evaluation appreciated.  Active Problems:    Osteoporosis Patient to follow-up with PCP for long-term treatment. Continue vitamin D   supplementation at home. Would  benefit from calcium  supplementation.    Acute respiratory failure with hypoxia (HCC) Likely due to medication. Already resolved and off oxygen. Will continue to monitor clinically.    Sinus bradycardia  Will hold carvedilol .    Hypertension Continue irbesartan  150 mg p.o. daily. Hold carvedilol  due to low heart rate.    Anxiety and depression Continue escitalopram 40 mg p.o. daily. Continue bupropion  150 mg p.o. daily. Continue alprazolam  0.5 mg p.o. twice daily as needed.    Hiatal hernia Associated with:   GERD (gastroesophageal reflux disease) Continue pantoprazole  40 mg p.o. twice daily.    Hyperlipidemia with target LDL less than 100   Thoracic aortic atherosclerosis   Hypertriglyceridemia Continue pravastatin  40 mg p.o. daily. No longer taking ezetimibe  10 mg p.o. daily.    Advance Care Planning:   Code Status: Full Code   Consults: Orthopedic surgery Laurier Chancy, MD)  Family Communication:   Severity of Illness: The appropriate patient status for this patient is OBSERVATION. Observation status is judged to be reasonable and necessary in order to provide the required intensity of service to ensure the patient's safety. The patient's presenting symptoms, physical exam findings, and initial radiographic and laboratory data in the context of their medical condition is felt to place them at decreased risk for further clinical deterioration. Furthermore, it is anticipated that the patient will be medically stable for discharge from the hospital within 2 midnights of admission.   Author: Alm Dorn Castor, MD 10/03/2024 2:19 PM  For on call review [email or web address].   This document was prepared using Dragon voice recognition software and may contain some unintended transcription errors.

## 2024-10-03 NOTE — ED Triage Notes (Addendum)
 Patient arrived from Radiology via wheelchair after a fall she sustained while accompanying her husband to a PET scan.

## 2024-10-04 DIAGNOSIS — S42201A Unspecified fracture of upper end of right humerus, initial encounter for closed fracture: Secondary | ICD-10-CM | POA: Diagnosis not present

## 2024-10-04 LAB — CBC
HCT: 40.4 % (ref 36.0–46.0)
Hemoglobin: 13.2 g/dL (ref 12.0–15.0)
MCH: 32.4 pg (ref 26.0–34.0)
MCHC: 32.7 g/dL (ref 30.0–36.0)
MCV: 99 fL (ref 80.0–100.0)
Platelets: 178 K/uL (ref 150–400)
RBC: 4.08 MIL/uL (ref 3.87–5.11)
RDW: 13.7 % (ref 11.5–15.5)
WBC: 6.5 K/uL (ref 4.0–10.5)
nRBC: 0 % (ref 0.0–0.2)

## 2024-10-04 LAB — COMPREHENSIVE METABOLIC PANEL WITH GFR
ALT: 81 U/L — ABNORMAL HIGH (ref 0–44)
AST: 111 U/L — ABNORMAL HIGH (ref 15–41)
Albumin: 3.5 g/dL (ref 3.5–5.0)
Alkaline Phosphatase: 108 U/L (ref 38–126)
Anion gap: 9 (ref 5–15)
BUN: 16 mg/dL (ref 8–23)
CO2: 27 mmol/L (ref 22–32)
Calcium: 9 mg/dL (ref 8.9–10.3)
Chloride: 102 mmol/L (ref 98–111)
Creatinine, Ser: 0.77 mg/dL (ref 0.44–1.00)
GFR, Estimated: >60 mL/min (ref >60)
Glucose, Bld: 98 mg/dL (ref 70–99)
Potassium: 4.4 mmol/L (ref 3.5–5.1)
Sodium: 138 mmol/L (ref 135–145)
Total Bilirubin: 1 mg/dL (ref 0.0–1.2)
Total Protein: 6 g/dL — ABNORMAL LOW (ref 6.5–8.1)

## 2024-10-04 NOTE — Care Management Obs Status (Signed)
 MEDICARE OBSERVATION STATUS NOTIFICATION   Patient Details  Name: Jody Wilson MRN: [id number] Date of Birth: 05-21-1951   Medicare Observation Status Notification Given:  Yes    Jody CHRISTELLA Eva, LCSW 10/04/2024, 2:21 PM

## 2024-10-04 NOTE — Progress Notes (Signed)
 PROGRESS NOTE    BERLINE SEMRAD  FMW:[id number] DOB: February 14, 1951 DOA: 10/03/2024 PCP: Dettinger, Fonda LABOR, MD   Brief Narrative:  73 y.o. female with medical history significant of iron deficiency anemia, depression, aortic atherosclerosis, hyperlipidemia, carotid artery disease, hypertension, orthostatic hypotension, pulmonary nodule, history of smoking in remission, GERD, B12 deficiency, osteoporosis, vertebral fracture, right clavicular fracture who is scheduled for right clavicle fracture repair surgery on October 29, 2024 presented with a fall and right shoulder and right hip pain.  On presentation, imaging revealed acute fracture of the right proximal humerus along with acute fracture of the right inferior pubic ramus.  Orthopedics was consulted.  Assessment & Plan:   Closed fracture of proximal end of right humerus Acute closed fracture of the right superior and inferior pubic rami along with nondisplaced fracture of the right sacral alla Fall -Orthopedics evaluation appreciated: Recommended nonoperative management with a sling and nonweightbearing for right humerus fracture and nonoperative management with weightbearing as tolerated for right lower extremity.  Outpatient follow-up with orthopedic/Dr. Onesimo - Precautions.  Pain management.  PT eval.  Osteoporosis - Outpatient follow-up with PCP.  Continue vitamin D  supplementation on discharge  Transient hypoxia -Possibly from pain.  Briefly required 2 L oxygen briefly decreased.  Since then she has been on room air.  Acute respiratory failure with hypoxia has been ruled out.  Leukocytosis - Resolved  Elevated LFTs -Questionable cause.  Hold statin.  Repeat a.m. labs.  Sinus bradycardia - Currently rate controlled and heart rate in the 60s.  Coreg  on hold  Hypertension Hyperlipidemia/hypertriglyceridemia Thoracic aortic atherosclerosis- Blood pressure stable.  Continue irbesartan .  Coreg  plan as above - Statin plan as  above.  Not on ezetimibe  anymore as an outpatient  Anxiety/depression - Continue citalopram  and bupropion .  Continue alprazolam  as needed  GERD Hiatal hernia -Continue PPI  DVT prophylaxis: Start SCDs Code Status: Full Family Communication: None at bedside Disposition Plan: Status is: Observation The patient will require care spanning > 2 midnights and should be moved to inpatient because: Of severity of illness.  Need for PT eval    Consultants: Orthopedics  Procedures: None  Antimicrobials: None   Subjective: Patient seen and examined at bedside.  Complains of hip and right shoulder pain.  No fever, vomiting, abdominal pain reported.  Objective: Vitals:   10/03/24 1943 10/03/24 2354 10/04/24 0416 10/04/24 0753  BP: 119/65 129/66 133/66 (!) 140/74  Pulse: 72 64 65 72  Resp: 17 15 15    Temp: 97.7 F (36.5 C) 98.5 F (36.9 C) 97.7 F (36.5 C) 98.4 F (36.9 C)  TempSrc: Oral  Oral Oral  SpO2: 95% 96% 95% 95%  Weight:      Height:        Intake/Output Summary (Last 24 hours) at 10/04/2024 1015 Last data filed at 10/04/2024 0900 Gross per 24 hour  Intake 240 ml  Output --  Net 240 ml   Filed Weights   10/03/24 1711  Weight: 62.6 kg    Examination:  General exam: Appears calm and comfortable.  Elderly female lying in bed. Respiratory system: Bilateral decreased breath sounds at bases, no wheezing Cardiovascular system: S1 & S2 heard, Rate controlled Gastrointestinal system: Abdomen is nondistended, soft and nontender. Normal bowel sounds heard. Extremities: No cyanosis, clubbing, edema.  Right upper extremity is in a sling. Central nervous system: Alert and oriented. No focal neurological deficits. Moving extremities Skin: No rashes, lesions or ulcers Psychiatry: Flat affect.  Not agitated.  Data Reviewed: I have  personally reviewed following labs and imaging studies  CBC: Recent Labs  Lab 10/03/24 1318 10/04/24 0413  WBC 11.2* 6.5  NEUTROABS  8.9*  --   HGB 14.6 13.2  HCT 43.9 40.4  MCV 97.6 99.0  PLT 214 178   Basic Metabolic Panel: Recent Labs  Lab 10/03/24 1318 10/04/24 0413  NA 141 138  K 4.4 4.4  CL 105 102  CO2 27 27  GLUCOSE 97 98  BUN 12 16  CREATININE 0.65 0.77  CALCIUM  9.1 9.0   GFR: Estimated Creatinine Clearance: 53.9 mL/min (by C-G formula based on SCr of 0.77 mg/dL). Liver Function Tests: Recent Labs  Lab 10/03/24 1318 10/04/24 0413  AST 22 111*  ALT 18 81*  ALKPHOS 71 108  BILITOT 0.7 1.0  PROT 6.5 6.0*  ALBUMIN 3.9 3.5   No results for input(s): LIPASE, AMYLASE in the last 168 hours. No results for input(s): AMMONIA in the last 168 hours. Coagulation Profile: No results for input(s): INR, PROTIME in the last 168 hours. Cardiac Enzymes: No results for input(s): CKTOTAL, CKMB, CKMBINDEX, TROPONINI in the last 168 hours. BNP (last 3 results) No results for input(s): PROBNP in the last 8760 hours. HbA1C: No results for input(s): HGBA1C in the last 72 hours. CBG: No results for input(s): GLUCAP in the last 168 hours. Lipid Profile: No results for input(s): CHOL, HDL, LDLCALC, TRIG, CHOLHDL, LDLDIRECT in the last 72 hours. Thyroid  Function Tests: No results for input(s): TSH, T4TOTAL, FREET4, T3FREE, THYROIDAB in the last 72 hours. Anemia Panel: No results for input(s): VITAMINB12, FOLATE, FERRITIN, TIBC, IRON, RETICCTPCT in the last 72 hours. Sepsis Labs: No results for input(s): PROCALCITON, LATICACIDVEN in the last 168 hours.  No results found for this or any previous visit (from the past 240 hours).       Radiology Studies: CT PELVIS WO CONTRAST Result Date: 10/03/2024 CLINICAL DATA:  Pain after fall. EXAM: CT PELVIS WITHOUT CONTRAST TECHNIQUE: Multidetector CT imaging of the pelvis was performed following the standard protocol without intravenous contrast. RADIATION DOSE REDUCTION: This exam was performed according  to the departmental dose-optimization program which includes automated exposure control, adjustment of the mA and/or kV according to patient size and/or use of iterative reconstruction technique. COMPARISON:  Same-day radiograph of the right hip dated 10/03/2024. FINDINGS: Bones/Joint/Cartilage Acute fracture of the right inferior pubic ramus with full shaft width of displacement in the transverse plane measuring 6 mm (series 2, image 105). Acute mildly displaced fracture of the right superior pubic ramus (series 7, images 52-53). Nondisplaced fracture of the right sacral ala with fracture margins extending to the right sacroiliac joint and cortical buckling anteriorly (series 2, images 26-34). No evidence of significant diastasis. Bilateral hip arthroplasties demonstrate normal alignment. Hardware is intact. Diffuse osseous demineralization. Degenerative changes of the visualized lower lumbar spine, most pronounced at L4-L5. Ligaments Ligaments are suboptimally evaluated by CT. Muscles and Tendons Soft tissue and intramuscular edema with hemorrhage surrounding the right superior and inferior pubic rami fractures. Soft tissue No loculated fluid collection. Aortoiliac atherosclerotic calcification. Descending and sigmoid colonic diverticulosis. IMPRESSION: 1. Acute fracture of the right inferior pubic ramus with full shaft width of displacement in the transverse plane measuring 6 mm. 2. Acute mildly displaced fracture of the right superior pubic ramus. 3. Nondisplaced fracture of the right sacral ala with fracture margins extending to the right sacroiliac joint. No evidence of significant diastasis. 4. Bilateral hip arthroplasties demonstrate normal alignment. Hardware is intact. 5. Soft tissue and intramuscular edema  with hemorrhage surrounding the right superior and inferior pubic rami fractures. Electronically Signed   By: Harrietta Sherry M.D.   On: 10/03/2024 19:14   CT SHOULDER RIGHT WO CONTRAST Result Date:  10/03/2024 CLINICAL DATA:  Right proximal humerus fracture after fall. EXAM: CT OF THE UPPER RIGHT EXTREMITY WITHOUT CONTRAST TECHNIQUE: Multidetector CT imaging of the upper right extremity was performed according to the standard protocol. RADIATION DOSE REDUCTION: This exam was performed according to the departmental dose-optimization program which includes automated exposure control, adjustment of the mA and/or kV according to patient size and/or use of iterative reconstruction technique. COMPARISON:  Radiograph dated 10/03/2024. FINDINGS: Bones/Joint/Cartilage Acute comminuted fracture of the greater tuberosity of the right proximal humerus with a minimally distracted cortical fracture fragment along the posterior margin of the humeral head. No dislocation. Mild glenohumeral joint osteoarthritis. 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 The acromioclavicular joint is anatomically aligned. Ligaments Suboptimally assessed by CT. Soft tissues/Muscles and Tendons Rotator cuff is suboptimally evaluated by CT. Soft tissue edema surrounding the proximal humeral fracture. Other: No enlarged lymph nodes identified in the field of view. Visualized portions of the right lung are clear. IMPRESSION: 1. Acute comminuted fracture of the greater tuberosity of the right proximal humerus with a minimally distracted cortical fracture fragment along the posterior margin of the humeral head. No dislocation. 2. 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 Electronically Signed   By: Harrietta Sherry M.D.   On: 10/03/2024 19:02   DG Chest 2 View Result Date: 10/03/2024 CLINICAL DATA:  Status post fall. EXAM: CHEST - 2 VIEW COMPARISON:  None Available. FINDINGS: The heart size and mediastinal contours are within normal limits. No acute infiltrate, pleural effusion or pneumothorax is identified. There is a large hiatal hernia. Acute fracture deformity is seen involving the head and neck of the  proximal right humerus. There is a chronic fracture deformity of the mid right clavicle. Postoperative changes are noted within the cervical spine. IMPRESSION: 1. Acute fracture of the proximal right humerus. 2. Large hiatal hernia. Electronically Signed   By: Suzen Dials M.D.   On: 10/03/2024 11:30   DG Humerus Right Result Date: 10/03/2024 CLINICAL DATA:  Status post fall. EXAM: RIGHT HUMERUS - 2+ VIEW COMPARISON:  None Available. FINDINGS: There is an acute fracture deformity involving the head and neck of the proximal right humerus. There is no evidence of dislocation. A chronic fracture of the mid right clavicle is noted. A chronic fracture of the right radial head is also suspected. Soft tissues are unremarkable. IMPRESSION: Acute fracture of the proximal right humerus. Electronically Signed   By: Suzen Dials M.D.   On: 10/03/2024 11:29   DG Hip Unilat W or Wo Pelvis 2-3 Views Right Result Date: 10/03/2024 CLINICAL DATA:  Status post fall. EXAM: DG HIP (WITH OR WITHOUT PELVIS) 2-3V*R* COMPARISON:  October 02, 2024 FINDINGS: Intact bilateral hip replacements are seen. Acute fracture is suspected along the right inferior pubic ramus. This represents a new finding when compared to the prior study a right superior pubic ramus fracture cannot be excluded. There is no evidence of dislocation. IMPRESSION: 1. Acute fracture of the right inferior pubic ramus. Correlation with nonemergent pelvis CT is recommended. 2. Intact bilateral hip replacements. Electronically Signed   By: Suzen Dials M.D.   On: 10/03/2024 11:27   DG Shoulder Right Result Date: 10/03/2024 CLINICAL DATA:  Status post fall. EXAM: RIGHT SHOULDER - 2+ VIEW COMPARISON:  August 13, 2024  FINDINGS: An acute, comminuted fracture deformity is seen involving the head and neck of the proximal right humerus. There is no evidence of dislocation. A chronic deformity of the mid right clavicle is noted. Postoperative changes are seen  within the cervical spine. Soft tissues are unremarkable. IMPRESSION: Acute fracture of the proximal right humerus. Electronically Signed   By: Suzen Dials M.D.   On: 10/03/2024 11:24   DG HIP UNILAT W OR W/O PELVIS 2-3 VIEWS RIGHT Result Date: 10/02/2024 AP pelvis and right hip x-rays were obtained in clinic today.  These are compared to prior x-rays.  Well-positioned left hip hemiarthroplasty, without acute injury.  Additional views of the right hip were available, and demonstrates a well-positioned right hip hemiarthroplasty.  No subsidence.  No fractures.  No acute injuries.  The hip is reduced.  No signs of excessive wear within the right acetabulum.  No subchondral cyst formation.  No subchondral sclerosis.  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        Scheduled Meds:  aspirin  EC  81 mg Oral Daily   buPROPion   150 mg Oral Daily   citalopram   40 mg Oral Daily   irbesartan   150 mg Oral Daily   pantoprazole   40 mg Oral BID   Continuous Infusions:        Sophie Mao, MD [facility] 10/04/2024, 10:15 AM

## 2024-10-05 DIAGNOSIS — S42201A Unspecified fracture of upper end of right humerus, initial encounter for closed fracture: Secondary | ICD-10-CM | POA: Diagnosis not present

## 2024-10-05 LAB — CBC WITH DIFFERENTIAL/PLATELET
Abs Immature Granulocytes: 0.04 K/uL (ref 0.00–0.07)
Basophils Absolute: 0 K/uL (ref 0.0–0.1)
Basophils Relative: 0 %
Eosinophils Absolute: 0.2 K/uL (ref 0.0–0.5)
Eosinophils Relative: 3 %
HCT: 39.6 % (ref 36.0–46.0)
Hemoglobin: 12.8 g/dL (ref 12.0–15.0)
Immature Granulocytes: 1 %
Lymphocytes Relative: 19 %
Lymphs Abs: 1.4 K/uL (ref 0.7–4.0)
MCH: 31.8 pg (ref 26.0–34.0)
MCHC: 32.3 g/dL (ref 30.0–36.0)
MCV: 98.3 fL (ref 80.0–100.0)
Monocytes Absolute: 0.5 K/uL (ref 0.1–1.0)
Monocytes Relative: 8 %
Neutro Abs: 4.8 K/uL (ref 1.7–7.7)
Neutrophils Relative %: 69 %
Platelets: 186 K/uL (ref 150–400)
RBC: 4.03 MIL/uL (ref 3.87–5.11)
RDW: 13.6 % (ref 11.5–15.5)
WBC: 7 K/uL (ref 4.0–10.5)
nRBC: 0 % (ref 0.0–0.2)

## 2024-10-05 LAB — COMPREHENSIVE METABOLIC PANEL WITH GFR
ALT: 89 U/L — ABNORMAL HIGH (ref 0–44)
AST: 58 U/L — ABNORMAL HIGH (ref 15–41)
Albumin: 3.5 g/dL (ref 3.5–5.0)
Alkaline Phosphatase: 150 U/L — ABNORMAL HIGH (ref 38–126)
Anion gap: 9 (ref 5–15)
BUN: 11 mg/dL (ref 8–23)
CO2: 28 mmol/L (ref 22–32)
Calcium: 9 mg/dL (ref 8.9–10.3)
Chloride: 101 mmol/L (ref 98–111)
Creatinine, Ser: 0.67 mg/dL (ref 0.44–1.00)
GFR, Estimated: >60 mL/min (ref >60)
Glucose, Bld: 97 mg/dL (ref 70–99)
Potassium: 3.9 mmol/L (ref 3.5–5.1)
Sodium: 139 mmol/L (ref 135–145)
Total Bilirubin: 1 mg/dL (ref 0.0–1.2)
Total Protein: 5.9 g/dL — ABNORMAL LOW (ref 6.5–8.1)

## 2024-10-05 LAB — MAGNESIUM: Magnesium: 1.9 mg/dL (ref 1.7–2.4)

## 2024-10-05 MED ORDER — METHOCARBAMOL 500 MG PO TABS
500.0000 mg | ORAL_TABLET | Freq: Four times a day (QID) | ORAL | Status: DC | PRN
  Administered 2024-10-05 – 2024-10-10 (×12): 500 mg via ORAL
  Filled 2024-10-05 (×12): qty 1

## 2024-10-05 NOTE — Evaluation (Addendum)
 Physical Therapy Evaluation Patient Details Name: Jody Wilson MRN: [id number] DOB: 1951/10/08 Today's Date: 10/05/2024  History of Present Illness  73 yo female who  tripped and fell while in radiology [facility]. She had immediate right shoulder pain and right hip pain. She was brought to the ED where x-rays showed a R humerus and a R pubic ramus fx and orthopedic surgery was consulted.  PMH: L hip fx 01/2023 s/p hemiarthroplasty, R THA 2* fx 2025, GAD, depression, tobacco use, orthostatic hypotension  Clinical Impression  Pt admitted with above diagnosis.  Pt is independent at her baseline. Endorses multiple  falls.  Pt requires +2 max/total assist for supine<>sit and sit<>stand transitions. Pt is significantly limited by pain however puts forth good effort as able. Pt was premedicated prior to PT. Pt SpO2= 80% on RA while sitting EOB, replaced at 2L then incr to 3L to get sats to 88% at rest. RN updated Patient will benefit from continued inpatient follow up therapy, <3 hours/day   Pt currently with functional limitations due to the deficits listed below (see PT Problem List). Pt will benefit from acute skilled PT to increase their independence and safety with mobility to allow discharge.           If plan is discharge home, recommend the following: A lot of help with walking and/or transfers;Two people to help with walking and/or transfers;Assist for transportation;Help with stairs or ramp for entrance   Can travel by private vehicle   No    Equipment Recommendations None recommended by PT  Recommendations for Other Services       Functional Status Assessment Patient has had a recent decline in their functional status and demonstrates the ability to make significant improvements in function in a reasonable and predictable amount of time.     Precautions / Restrictions Precautions Precautions: Fall Required Braces or Orthoses: Sling Restrictions Weight Bearing Restrictions Per  Provider Order: Yes RUE Weight Bearing Per Provider Order: Non weight bearing RLE Weight Bearing Per Provider Order: Weight bearing as tolerated      Mobility  Bed Mobility Overal bed mobility: Needs Assistance Bed Mobility: Supine to Sit, Sit to Supine     Supine to sit: Max assist, Total assist, +2 for physical assistance, +2 for safety/equipment Sit to supine: Max assist, Total assist, +2 for physical assistance, +2 for safety/equipment   General bed mobility comments: bed pad used to pivot hips, assist to elevate/lower trunk and progress bil LEs on/off bed;    Transfers Overall transfer level: Needs assistance Equipment used: Rolling walker (2 wheels) Transfers: Sit to/from Stand Sit to Stand: Mod assist, Max assist, +2 safety/equipment, +2 physical assistance           General transfer comment: L Hand hold only on RW; cues for hand placement and foot positon. assist to rise and stabilize, incr time needed d/t pain; good pt effort within limits of pain    Ambulation/Gait                  Stairs            Wheelchair Mobility     Tilt Bed    Modified Rankin (Stroke Patients Only)       Balance Overall balance assessment: Needs assistance Sitting-balance support: Feet supported, L upper extremity supported Sitting balance-Leahy Scale: Poor     Standing balance support: Reliant on assistive device for balance, During functional activity Standing balance-Leahy Scale: Zero Standing balance comment: reliant on L  UE and external assist, posterior lean- improved with time                             Pertinent Vitals/Pain Pain Assessment Pain Assessment: 0-10 Pain Score: 5  Pain Location: right shoulder Pain Descriptors / Indicators: Aching, Sore Pain Intervention(s): Monitored during session, Premedicated before session, Repositioned, Limited activity within patient's tolerance    Home Living Family/patient expects to be discharged  to:: Private residence Living Arrangements: Spouse/significant other Available Help at Discharge: Family;Available PRN/intermittently Type of Home: House Home Access: Stairs to enter Entrance Stairs-Rails: None Entrance Stairs-Number of Steps: 1   Home Layout: One level Home Equipment: Agricultural consultant (2 wheels);Hand held shower head;Adaptive equipment      Prior Function Prior Level of Function : Independent/Modified Independent;Driving             Mobility Comments: Tourist information centre manager without AD ADLs Comments: independent     Extremity/Trunk Assessment   Upper Extremity Assessment Upper Extremity Assessment: Defer to OT evaluation;Right hand dominant;RUE deficits/detail RUE: Unable to fully assess due to immobilization;Shoulder pain at rest    Lower Extremity Assessment Lower Extremity Assessment: RLE deficits/detail;LLE deficits/detail RLE Deficits / Details: AAROM limited by pain, strength 2/5, further MMT limited by pain LLE Deficits / Details: AAROM WFL, strength at least  3/5, further MMT limited by pain       Communication   Communication Communication: No apparent difficulties    Cognition Arousal: Alert Behavior During Therapy: WFL for tasks assessed/performed   PT - Cognitive impairments: No apparent impairments                         Following commands: Intact       Cueing Cueing Techniques: Verbal cues     General Comments      Exercises     Assessment/Plan    PT Assessment Patient needs continued PT services  PT Problem List Decreased strength;Decreased balance;Decreased activity tolerance;Decreased mobility;Decreased knowledge of use of DME;Pain;Cardiopulmonary status limiting activity       PT Treatment Interventions DME instruction;Therapeutic exercise;Gait training;Functional mobility training;Therapeutic activities;Patient/family education    PT Goals (Current goals can be found in the Care Plan section)  Acute Rehab  PT Goals Patient Stated Goal: have less pain PT Goal Formulation: With patient Time For Goal Achievement: 10/19/24 Potential to Achieve Goals: Good    Frequency Min 3X/week     Co-evaluation               AM-PAC PT 6 Clicks Mobility  Outcome Measure Help needed turning from your back to your side while in a flat bed without using bedrails?: Total Help needed moving from lying on your back to sitting on the side of a flat bed without using bedrails?: Total Help needed moving to and from a bed to a chair (including a wheelchair)?: Total Help needed standing up from a chair using your arms (e.g., wheelchair or bedside chair)?: Total Help needed to walk in hospital room?: Total Help needed climbing 3-5 steps with a railing? : Total 6 Click Score: 6    End of Session Equipment Utilized During Treatment: Gait belt (sling R - adjusted to proper position) Activity Tolerance: Patient limited by pain Patient left: in bed;with call bell/phone within reach;with bed alarm set Nurse Communication: Mobility status PT Visit Diagnosis: Other abnormalities of gait and mobility (R26.89);Repeated falls (R29.6)    Time: 8756-8688 PT  Time Calculation (min) (ACUTE ONLY): 28 min   Charges:   PT Evaluation $PT Eval Low Complexity: 1 Low PT Treatments $Therapeutic Activity: 8-22 mins PT General Charges $$ ACUTE PT VISIT: 1 Visit         [place], PT  Acute Rehab Dept [facility]) [phone]  10/05/2024   [facility] 10/05/2024, 1:45 PM

## 2024-10-05 NOTE — Progress Notes (Signed)
 PROGRESS NOTE    Jody Wilson  FMW:[id number] DOB: [birth year] DOA: 10/03/2024 PCP: Dettinger, Fonda LABOR, MD   Brief Narrative:  73 y.o. female with medical history significant of iron deficiency anemia, depression, aortic atherosclerosis, hyperlipidemia, carotid artery disease, hypertension, orthostatic hypotension, pulmonary nodule, history of smoking in remission, GERD, B12 deficiency, osteoporosis, vertebral fracture, right clavicular fracture who is scheduled for right clavicle fracture repair surgery on October 29, 2024 presented with a fall and right shoulder and right hip pain.  On presentation, imaging revealed acute fracture of the right proximal humerus along with acute fracture of the right inferior pubic ramus.  Orthopedics was consulted: Recommended conservative management.  Assessment & Plan:   Closed fracture of proximal end of right humerus Acute closed fracture of the right superior and inferior pubic rami along with nondisplaced fracture of the right sacral alla Fall -Orthopedics evaluation appreciated: Recommended nonoperative management with a sling and nonweightbearing for right humerus fracture and nonoperative management with weightbearing as tolerated for right lower extremity.  Outpatient follow-up with orthopedic/Dr. Onesimo - Follow precautions.  Pain management.  PT eval pending.  Osteoporosis - Outpatient follow-up with PCP.  Continue vitamin D  supplementation on discharge  Transient hypoxia -Possibly from pain.  Briefly required 2 L oxygen briefly decreased.  Since then she has been on room air.  Acute respiratory failure with hypoxia has been ruled out.  Leukocytosis - Resolved  Elevated LFTs -Questionable cause.  Hold statin.  LFTs improving.  Monitor.  Sinus bradycardia - Currently rate controlled and heart rate in the 60s.  Coreg  on hold  Hypertension Hyperlipidemia/hypertriglyceridemia Thoracic aortic atherosclerosis- Blood pressure stable.   Continue irbesartan .  Coreg  plan as above - Statin plan as above.  Not on ezetimibe  anymore as an outpatient  Anxiety/depression - Continue citalopram  and bupropion .  Continue alprazolam  as needed  GERD Hiatal hernia -Continue PPI  DVT prophylaxis: SCDs Code Status: Full Family Communication: None at bedside Disposition Plan: Status is: Observation The patient will require care spanning > 2 midnights and should be moved to inpatient because: Of severity of illness.  Need for PT eval    Consultants: Orthopedics  Procedures: None  Antimicrobials: None   Subjective: Patient seen and examined at bedside.  Denies worsening shortness breath, vomiting or abdominal pain.  Continues to have intermittent right shoulder and hip pain. Objective: Vitals:   10/04/24 1311 10/04/24 1952 10/05/24 0509 10/05/24 1317  BP: 139/81 130/67 122/73 (!) 99/53  Pulse: 73 65 65 73  Resp: 20   20  Temp: 97.8 F (36.6 C) 97.9 F (36.6 C) 97.8 F (36.6 C) 98.4 F (36.9 C)  TempSrc: Oral Oral Oral Oral  SpO2: 92% 96% 93% 93%  Weight:      Height:        Intake/Output Summary (Last 24 hours) at 10/05/2024 1349 Last data filed at 10/05/2024 0900 Gross per 24 hour  Intake 320 ml  Output 500 ml  Net -180 ml   Filed Weights   10/03/24 1711  Weight: 62.6 kg    Examination:  General: On room air.  No distress ENT/neck: No thyromegaly.  JVD is not elevated  respiratory: Decreased breath sounds at bases bilaterally with some crackles; no wheezing  CVS: S1-S2 heard, rate controlled currently Abdominal: Soft, nontender, slightly distended; no organomegaly, bowel sounds are heard Extremities: Trace lower extremity edema; no cyanosis.  CNS: Awake and alert.  No focal neurologic deficit.  Moves extremities Lymph: No obvious lymphadenopathy Skin: No obvious ecchymosis/lesions  psych: Affect is mostly flat.  Not agitated currently.   Musculoskeletal: Right upper extremity is in a sling.   Data  Reviewed: I have personally reviewed following labs and imaging studies  CBC: Recent Labs  Lab 10/03/24 1318 10/04/24 0413 10/05/24 0410  WBC 11.2* 6.5 7.0  NEUTROABS 8.9*  --  4.8  HGB 14.6 13.2 12.8  HCT 43.9 40.4 39.6  MCV 97.6 99.0 98.3  PLT 214 178 186   Basic Metabolic Panel: Recent Labs  Lab 10/03/24 1318 10/04/24 0413 10/05/24 0410  NA 141 138 139  K 4.4 4.4 3.9  CL 105 102 101  CO2 27 27 28   GLUCOSE 97 98 97  BUN 12 16 11   CREATININE 0.65 0.77 0.67  CALCIUM  9.1 9.0 9.0  MG  --   --  1.9   GFR: Estimated Creatinine Clearance: 53.9 mL/min (by C-G formula based on SCr of 0.67 mg/dL). Liver Function Tests: Recent Labs  Lab 10/03/24 1318 10/04/24 0413 10/05/24 0410  AST 22 111* 58*  ALT 18 81* 89*  ALKPHOS 71 108 150*  BILITOT 0.7 1.0 1.0  PROT 6.5 6.0* 5.9*  ALBUMIN 3.9 3.5 3.5   No results for input(s): LIPASE, AMYLASE in the last 168 hours. No results for input(s): AMMONIA in the last 168 hours. Coagulation Profile: No results for input(s): INR, PROTIME in the last 168 hours. Cardiac Enzymes: No results for input(s): CKTOTAL, CKMB, CKMBINDEX, TROPONINI in the last 168 hours. BNP (last 3 results) No results for input(s): PROBNP in the last 8760 hours. HbA1C: No results for input(s): HGBA1C in the last 72 hours. CBG: No results for input(s): GLUCAP in the last 168 hours. Lipid Profile: No results for input(s): CHOL, HDL, LDLCALC, TRIG, CHOLHDL, LDLDIRECT in the last 72 hours. Thyroid  Function Tests: No results for input(s): TSH, T4TOTAL, FREET4, T3FREE, THYROIDAB in the last 72 hours. Anemia Panel: No results for input(s): VITAMINB12, FOLATE, FERRITIN, TIBC, IRON, RETICCTPCT in the last 72 hours. Sepsis Labs: No results for input(s): PROCALCITON, LATICACIDVEN in the last 168 hours.  No results found for this or any previous visit (from the past 240 hours).       Radiology  Studies: CT PELVIS WO CONTRAST Result Date: 10/03/2024 CLINICAL DATA:  Pain after fall. EXAM: CT PELVIS WITHOUT CONTRAST TECHNIQUE: Multidetector CT imaging of the pelvis was performed following the standard protocol without intravenous contrast. RADIATION DOSE REDUCTION: This exam was performed according to the departmental dose-optimization program which includes automated exposure control, adjustment of the mA and/or kV according to patient size and/or use of iterative reconstruction technique. COMPARISON:  Same-day radiograph of the right hip dated 10/03/2024. FINDINGS: Bones/Joint/Cartilage Acute fracture of the right inferior pubic ramus with full shaft width of displacement in the transverse plane measuring 6 mm (series 2, image 105). Acute mildly displaced fracture of the right superior pubic ramus (series 7, images 52-53). Nondisplaced fracture of the right sacral ala with fracture margins extending to the right sacroiliac joint and cortical buckling anteriorly (series 2, images 26-34). No evidence of significant diastasis. Bilateral hip arthroplasties demonstrate normal alignment. Hardware is intact. Diffuse osseous demineralization. Degenerative changes of the visualized lower lumbar spine, most pronounced at L4-L5. Ligaments Ligaments are suboptimally evaluated by CT. Muscles and Tendons Soft tissue and intramuscular edema with hemorrhage surrounding the right superior and inferior pubic rami fractures. Soft tissue No loculated fluid collection. Aortoiliac atherosclerotic calcification. Descending and sigmoid colonic diverticulosis. IMPRESSION: 1. Acute fracture of the right inferior pubic ramus with full  shaft width of displacement in the transverse plane measuring 6 mm. 2. Acute mildly displaced fracture of the right superior pubic ramus. 3. Nondisplaced fracture of the right sacral ala with fracture margins extending to the right sacroiliac joint. No evidence of significant diastasis. 4. Bilateral  hip arthroplasties demonstrate normal alignment. Hardware is intact. 5. Soft tissue and intramuscular edema with hemorrhage surrounding the right superior and inferior pubic rami fractures. Electronically Signed   By: Harrietta Sherry M.D.   On: 10/03/2024 19:14   CT SHOULDER RIGHT WO CONTRAST Result Date: 10/03/2024 CLINICAL DATA:  Right proximal humerus fracture after fall. EXAM: CT OF THE UPPER RIGHT EXTREMITY WITHOUT CONTRAST TECHNIQUE: Multidetector CT imaging of the upper right extremity was performed according to the standard protocol. RADIATION DOSE REDUCTION: This exam was performed according to the departmental dose-optimization program which includes automated exposure control, adjustment of the mA and/or kV according to patient size and/or use of iterative reconstruction technique. COMPARISON:  Radiograph dated 10/03/2024. FINDINGS: Bones/Joint/Cartilage Acute comminuted fracture of the greater tuberosity of the right proximal humerus with a minimally distracted cortical fracture fragment along the posterior margin of the humeral head. No dislocation. Mild glenohumeral joint osteoarthritis. 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 The acromioclavicular joint is anatomically aligned. Ligaments Suboptimally assessed by CT. Soft tissues/Muscles and Tendons Rotator cuff is suboptimally evaluated by CT. Soft tissue edema surrounding the proximal humeral fracture. Other: No enlarged lymph nodes identified in the field of view. Visualized portions of the right lung are clear. IMPRESSION: 1. Acute comminuted fracture of the greater tuberosity of the right proximal humerus with a minimally distracted cortical fracture fragment along the posterior margin of the humeral head. No dislocation. 2. 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 Electronically Signed   By: Harrietta Sherry M.D.   On: 10/03/2024 19:02        Scheduled Meds:  aspirin  EC  81 mg Oral Daily    buPROPion   150 mg Oral Daily   citalopram   40 mg Oral Daily   irbesartan   150 mg Oral Daily   pantoprazole   40 mg Oral BID   Continuous Infusions:        Sophie Mao, MD [facility] 10/05/2024, 1:49 PM

## 2024-10-05 NOTE — Plan of Care (Signed)
  Problem: Education: Goal: Knowledge of General Education information will improve Description: Including pain rating scale, medication(s)/side effects and non-pharmacologic comfort measures Outcome: Progressing   Problem: Clinical Measurements: Goal: Ability to maintain clinical measurements within normal limits will improve Outcome: Progressing   Problem: Coping: Goal: Level of anxiety will decrease Outcome: Progressing   Problem: Pain Managment: Goal: General experience of comfort will improve and/or be controlled Outcome: Progressing   Problem: Safety: Goal: Ability to remain free from injury will improve Outcome: Progressing

## 2024-10-06 DIAGNOSIS — S42201A Unspecified fracture of upper end of right humerus, initial encounter for closed fracture: Secondary | ICD-10-CM | POA: Diagnosis not present

## 2024-10-06 NOTE — Plan of Care (Signed)

## 2024-10-06 NOTE — Progress Notes (Signed)
 Patient transferred to room # 1332. Report called to [place], [place]

## 2024-10-06 NOTE — Evaluation (Signed)
 Occupational Therapy Evaluation Patient Details Name: Jody Wilson MRN: [id number] DOB: [birth year] Today's Date: 10/06/2024   History of Present Illness   73 yo female who  tripped and fell while in radiology [facility]. She had immediate right shoulder pain and right hip pain. She was brought to the ED where x-rays showed a humerus and a pubic ramus fx and orthopedic surgery was consulted.  PMH: L hip fx 01/2023 s/p hemiarthroplasty, R THA, GAD, depression, tobacco use, orthostatic hypotension     Clinical Impressions PTA, pt was living at home and reports she was independent with ADL/IADL and functional mobility. Pt received sitting on BSC and reporting 10/10 pain level. RN arrived to session to assist with transfer back to bed. Pt significantly limited by pain this session and required maxA+2 with use of sara stedy to transfer from [facility] to EOB. She required maxA for pericare. She required maxA+2 sit>supine transfer. Patient will benefit from continued inpatient follow up therapy, <3 hours/day. Will continue to follow acutely and progress appropriately.       If plan is discharge home, recommend the following:   A lot of help with walking and/or transfers;A lot of help with bathing/dressing/bathroom;Assist for transportation     Functional Status Assessment   Patient has had a recent decline in their functional status and demonstrates the ability to make significant improvements in function in a reasonable and predictable amount of time.     Equipment Recommendations   BSC/3in1     Recommendations for Other Services         Precautions/Restrictions   Precautions Precautions: Fall Required Braces or Orthoses: Sling Restrictions Weight Bearing Restrictions Per Provider Order: Yes RUE Weight Bearing Per Provider Order: Non weight bearing RLE Weight Bearing Per Provider Order: Weight bearing as tolerated     Mobility Bed Mobility Overal bed mobility: Needs  Assistance Bed Mobility: Sit to Supine       Sit to supine: Max assist, +2 for physical assistance, +2 for safety/equipment   General bed mobility comments: assist at trunk and BLE to return pt to supine position    Transfers Overall transfer level: Needs assistance Equipment used: Rolling walker (2 wheels) Transfers: Sit to/from Stand Sit to Stand: Max assist, +2 safety/equipment, +2 physical assistance           General transfer comment: increased time for foot placement, pt requiring maxA+2 to powerup into standing Transfer via Lift Equipment: Stedy    Balance Overall balance assessment: Needs assistance Sitting-balance support: Feet supported, Bilateral upper extremity supported Sitting balance-Leahy Scale: Poor     Standing balance support: Reliant on assistive device for balance, During functional activity Standing balance-Leahy Scale: Zero Standing balance comment: reliant on UEs and external assist, posterior lean- improved with time                           ADL either performed or assessed with clinical judgement   ADL Overall ADL's : Needs assistance/impaired Eating/Feeding: Set up;Sitting   Grooming: Minimal assistance;Sitting   Upper Body Bathing: Minimal assistance   Lower Body Bathing: Moderate assistance   Upper Body Dressing : Moderate assistance   Lower Body Dressing: Maximal assistance   Toilet Transfer: Maximal assistance;+2 for physical assistance;+2 for safety/equipment Toilet Transfer Details (indicate cue type and reason): with use of sara stedy to get off BSC, pt voided, RN aware Toileting- Clothing Manipulation and Hygiene: Maximal assistance;Sitting/lateral lean       Functional  mobility during ADLs: Maximal assistance;+2 for physical assistance;+2 for safety/equipment General ADL Comments: pt significantly limited by pain this session, requiring use of sara stedy +2 assist to stand from [facility]     Vision          Perception         Praxis         Pertinent Vitals/Pain Pain Assessment Pain Assessment: 0-10 Pain Score: 10-Worst pain ever Pain Location: right shoulder R pubis Pain Descriptors / Indicators: Aching, Sore Pain Intervention(s): Monitored during session, Limited activity within patient's tolerance     Extremity/Trunk Assessment Upper Extremity Assessment Upper Extremity Assessment: RUE deficits/detail RUE Deficits / Details: limited ROM secondary to pain. able to move digits RUE: Unable to fully assess due to pain   Lower Extremity Assessment Lower Extremity Assessment: Defer to PT evaluation   Cervical / Trunk Assessment Cervical / Trunk Assessment: Kyphotic   Communication Communication Communication: No apparent difficulties   Cognition Arousal: Alert Behavior During Therapy: WFL for tasks assessed/performed Cognition: No apparent impairments             OT - Cognition Comments: good recall of precautions                 Following commands: Intact       Cueing  General Comments   Cueing Techniques: Verbal cues  vss on RA   Exercises     Shoulder Instructions      Home Living Family/patient expects to be discharged to:: Private residence Living Arrangements: Spouse/significant other Available Help at Discharge: Family;Available PRN/intermittently Type of Home: House Home Access: Stairs to enter [employer] of Steps: 1 Entrance Stairs-Rails: None Home Layout: One level     Bathroom Shower/Tub: Chief Strategy Officer: Standard Bathroom Accessibility: Yes   Home Equipment: Agricultural consultant (2 wheels);Hand held shower head;Adaptive equipment Adaptive Equipment: Reacher;Sock aid        Prior Functioning/Environment Prior Level of Function : Independent/Modified Independent;Driving             Mobility Comments: Tourist information centre manager without AD ADLs Comments: independent    OT Problem List: Decreased  strength;Decreased range of motion;Impaired balance (sitting and/or standing);Decreased activity tolerance;Pain;Impaired UE functional use   OT Treatment/Interventions: Self-care/ADL training;Therapeutic exercise;DME and/or AE instruction;Therapeutic activities;Patient/family education;Balance training      OT Goals(Current goals can be found in the care plan section)   Acute Rehab OT Goals Patient Stated Goal: to manage pain OT Goal Formulation: With patient Time For Goal Achievement: 10/20/24 Potential to Achieve Goals: Good ADL Goals Pt Will Perform Grooming: with set-up;standing Pt Will Perform Upper Body Dressing: with contact guard assist;sitting Pt Will Transfer to Toilet: with min assist;stand pivot transfer;bedside commode   OT Frequency:  Min 2X/week    Co-evaluation              AM-PAC OT 6 Clicks Daily Activity     Outcome Measure Help from another person eating meals?: A Little Help from another person taking care of personal grooming?: A Little Help from another person toileting, which includes using toliet, bedpan, or urinal?: A Lot Help from another person bathing (including washing, rinsing, drying)?: A Lot Help from another person to put on and taking off regular upper body clothing?: A Lot Help from another person to put on and taking off regular lower body clothing?: A Lot 6 Click Score: 14   End of Session Equipment Utilized During Treatment: Other (comment) (sara stedy) Nurse Communication: Mobility  status  Activity Tolerance: Patient limited by pain;Patient tolerated treatment well Patient left: in bed;with call bell/phone within reach;with nursing/sitter in room  OT Visit Diagnosis: Other abnormalities of gait and mobility (R26.89);Muscle weakness (generalized) (M62.81);History of falling (Z91.81);Pain Pain - Right/Left: Right Pain - part of body: Shoulder;Arm;Hip                Time: 8988-8964 OT Time Calculation (min): 24 min Charges:  OT  General Charges $OT Visit: 1 Visit OT Evaluation $OT Eval Moderate Complexity: 1 Mod OT Treatments $Self Care/Home Management : 8-22 mins  Verneita OTR/L Acute Rehabilitation Services Office: [phone]   Verneita ONEIDA Moose 10/06/2024, 10:47 AM

## 2024-10-06 NOTE — Plan of Care (Signed)

## 2024-10-06 NOTE — Progress Notes (Signed)
 PROGRESS NOTE    Jody Wilson  FMW:[id number] DOB: 1951/02/17 DOA: 10/03/2024 PCP: Dettinger, Fonda LABOR, MD   Brief Narrative:  73 y.o. female with medical history significant of iron deficiency anemia, depression, aortic atherosclerosis, hyperlipidemia, carotid artery disease, hypertension, orthostatic hypotension, pulmonary nodule, history of smoking in remission, GERD, B12 deficiency, osteoporosis, vertebral fracture, right clavicular fracture who is scheduled for right clavicle fracture repair surgery on October 29, 2024 presented with a fall and right shoulder and right hip pain.  On presentation, imaging revealed acute fracture of the right proximal humerus along with acute fracture of the right inferior pubic ramus.  Orthopedics was consulted: Recommended conservative management.  Assessment & Plan:   Closed fracture of proximal end of right humerus Acute closed fracture of the right superior and inferior pubic rami along with nondisplaced fracture of the right sacral alla Fall -Orthopedics evaluation appreciated: Recommended nonoperative management with a sling and nonweightbearing for right humerus fracture and nonoperative management with weightbearing as tolerated for right lower extremity.  Outpatient follow-up with orthopedic/Dr. Onesimo - Fall precautions.  Pain management.  PT recommended SNF.  TOC consulted.  Currently medically stable for discharge to SNF  Osteoporosis - Outpatient follow-up with PCP.  Continue vitamin D  supplementation on discharge  Transient hypoxia -Possibly from pain.  Briefly required 2 L oxygen briefly decreased.   - Resolved  Elevated LFTs -Questionable cause.  Hold statin.  LFTs improving.  Monitor.  Sinus bradycardia - Currently rate controlled and heart rate in the 60s.  Coreg  on hold  Hypertension Hyperlipidemia/hypertriglyceridemia Thoracic aortic atherosclerosis- Blood pressure stable.  Continue irbesartan .  Coreg  plan as above -  Statin plan as above.  Not on ezetimibe  anymore as an outpatient  Anxiety/depression - Continue citalopram  and bupropion .  Continue alprazolam  as needed  GERD Hiatal hernia -Continue PPI  DVT prophylaxis: SCDs Code Status: Full Family Communication: None at bedside Disposition Plan: Status is: Observation The patient will require care spanning > 2 midnights and should be moved to inpatient because: Of severity of illness.  Need for SNF placement    Consultants: Orthopedics  Procedures: None  Antimicrobials: None   Subjective: Patient seen and examined at bedside.  Continues to have right shoulder and hip pain intermittently.  No fever or vomiting reported.   Objective: Vitals:   10/05/24 1317 10/05/24 2117 10/06/24 0135 10/06/24 0625  BP: (!) 99/53 124/67 110/63 (!) 106/55  Pulse: 73 84 76 70  Resp: 20 18 16 16   Temp: 98.4 F (36.9 C) 98.3 F (36.8 C) 97.9 F (36.6 C) 98.2 F (36.8 C)  TempSrc: Oral Oral Oral Oral  SpO2: 93% 91% 97% 94%  Weight:      Height:        Intake/Output Summary (Last 24 hours) at 10/06/2024 0850 Last data filed at 10/06/2024 0600 Gross per 24 hour  Intake 450 ml  Output 450 ml  Net 0 ml   Filed Weights   10/03/24 1711  Weight: 62.6 kg    Examination:  General: Currently on room air.  No acute distress respiratory: Bilateral decreased breath sounds at bases with scattered crackles CVS: Rate mostly controlled; S1 and S2 are heard Abdominal: Soft, nontender, distended mildly; no organomegaly, bowel sounds are heard normally Extremities: No clubbing; mild lower extremity edema present.  Right upper extremity is in a sling.  Data Reviewed: I have personally reviewed following labs and imaging studies  CBC: Recent Labs  Lab 10/03/24 1318 10/04/24 0413 10/05/24 0410  WBC 11.2*  6.5 7.0  NEUTROABS 8.9*  --  4.8  HGB 14.6 13.2 12.8  HCT 43.9 40.4 39.6  MCV 97.6 99.0 98.3  PLT 214 178 186   Basic Metabolic Panel: Recent  Labs  Lab 10/03/24 1318 10/04/24 0413 10/05/24 0410  NA 141 138 139  K 4.4 4.4 3.9  CL 105 102 101  CO2 27 27 28   GLUCOSE 97 98 97  BUN 12 16 11   CREATININE 0.65 0.77 0.67  CALCIUM  9.1 9.0 9.0  MG  --   --  1.9   GFR: Estimated Creatinine Clearance: 53.9 mL/min (by C-G formula based on SCr of 0.67 mg/dL). Liver Function Tests: Recent Labs  Lab 10/03/24 1318 10/04/24 0413 10/05/24 0410  AST 22 111* 58*  ALT 18 81* 89*  ALKPHOS 71 108 150*  BILITOT 0.7 1.0 1.0  PROT 6.5 6.0* 5.9*  ALBUMIN 3.9 3.5 3.5   No results for input(s): LIPASE, AMYLASE in the last 168 hours. No results for input(s): AMMONIA in the last 168 hours. Coagulation Profile: No results for input(s): INR, PROTIME in the last 168 hours. Cardiac Enzymes: No results for input(s): CKTOTAL, CKMB, CKMBINDEX, TROPONINI in the last 168 hours. BNP (last 3 results) No results for input(s): PROBNP in the last 8760 hours. HbA1C: No results for input(s): HGBA1C in the last 72 hours. CBG: No results for input(s): GLUCAP in the last 168 hours. Lipid Profile: No results for input(s): CHOL, HDL, LDLCALC, TRIG, CHOLHDL, LDLDIRECT in the last 72 hours. Thyroid  Function Tests: No results for input(s): TSH, T4TOTAL, FREET4, T3FREE, THYROIDAB in the last 72 hours. Anemia Panel: No results for input(s): VITAMINB12, FOLATE, FERRITIN, TIBC, IRON, RETICCTPCT in the last 72 hours. Sepsis Labs: No results for input(s): PROCALCITON, LATICACIDVEN in the last 168 hours.  No results found for this or any previous visit (from the past 240 hours).       Radiology Studies: No results found.       Scheduled Meds:  aspirin  EC  81 mg Oral Daily   buPROPion   150 mg Oral Daily   citalopram   40 mg Oral Daily   irbesartan   150 mg Oral Daily   pantoprazole   40 mg Oral BID   Continuous Infusions:        Sophie Mao, MD [facility] 10/06/2024,  8:50 AM

## 2024-10-07 DIAGNOSIS — S42201A Unspecified fracture of upper end of right humerus, initial encounter for closed fracture: Secondary | ICD-10-CM | POA: Diagnosis not present

## 2024-10-07 NOTE — NC FL2 (Signed)
 [place]  MEDICAID [id number] LEVEL OF CARE FORM     IDENTIFICATION  Patient Name: Jody Wilson Birthdate: [birth year] Sex: female Admission Date (Current Location): 10/03/2024  [facility] and [place] Number:  Producer, television/film/video and Address:  [facility],  [address] [place], [place] [address]      Provider Number: [id number]  Attending Physician Name and Address:  Cheryle Page, MD  Relative Name and Phone Number:  Nadia Viar (son) [phone] / TEMILOLUWA RECCHIA (son) [phone]    Current Level of Care: Hospital Recommended Level of Care: Skilled Nursing Facility Prior Approval Number:    Date Approved/Denied:   PASRR Number: [id number]  Discharge Plan: SNF    Current Diagnoses: Patient Active Problem List   Diagnosis Date Noted   Closed fracture of proximal end of right humerus 10/03/2024   Closed fracture of right inferior pubic ramus (HCC) 10/03/2024   Sinus bradycardia 10/03/2024   NSTEMI (non-ST elevated myocardial infarction) (HCC) 06/29/2024   Syncope 06/27/2024   Elevated troponin 06/27/2024   Acute respiratory failure with hypoxia (HCC) 06/27/2024   Abnormal CT of liver 06/26/2024   Incontinence of feces 05/08/2024   Closed fracture of neck of left femur (HCC) 04/13/2024   Hypokalemia 04/13/2024   Closed hip fracture requiring operative repair with routine healing 02/09/2023   Pulmonary nodule 02/09/2023   Hypertriglyceridemia 01/22/2022   Abnormal findings on diagnostic imaging of liver and biliary tract 12/09/2021   Diarrhea 12/09/2021   History of colonic polyps 12/09/2021   Anxiety and depression 06/23/2020   Hiatal hernia 06/23/2020   Thoracic aortic atherosclerosis 06/23/2020   Smoking history 04/25/2020   Involuntary muscle jerks while sleeping 09/26/2019   B12 deficiency 09/25/2019   Iron deficiency anemia 09/25/2019   Elevated liver function tests 09/07/2019   Osteoporosis 02/09/2017   History of vertebral fracture 02/09/2017   GERD  (gastroesophageal reflux disease) 03/11/2016   Hyperlipidemia with target LDL less than 100 02/19/2015   Hypertension 03/18/2014    Orientation RESPIRATION BLADDER Height & Weight     Self, Time, Situation  O2 (2LNC) External catheter Weight: 62.6 kg Height:  5' 1 (154.9 cm)  BEHAVIORAL SYMPTOMS/MOOD NEUROLOGICAL BOWEL NUTRITION STATUS      Continent Diet (heart healthy)  AMBULATORY STATUS COMMUNICATION OF NEEDS Skin   Extensive Assist Verbally Normal                       Personal Care Assistance Level of Assistance  Bathing, Feeding, Dressing   Feeding assistance: Independent Dressing Assistance: Maximum assistance     Functional Limitations Info  Sight, Hearing, Speech Sight Info: Impaired (glasses) Hearing Info: Adequate Speech Info: Adequate    SPECIAL CARE FACTORS FREQUENCY  PT (By licensed PT), OT (By licensed OT)     PT Frequency: 5x/week OT Frequency: 5x/week            Contractures Contractures Info: Not present    Additional Factors Info  Code Status, Allergies Code Status Info: Full Code Allergies Info: Tetracyclines & Related           Current Medications (10/07/2024):  This is the current hospital active medication list Current Facility-Administered Medications  Medication Dose Route Frequency Provider Last Rate Last Admin   acetaminophen  (TYLENOL ) tablet 650 mg  650 mg Oral Q6H PRN Celinda Alm Lot, MD   650 mg at 10/07/24 9045   Or   acetaminophen  (TYLENOL ) suppository 650 mg  650 mg Rectal Q6H PRN Celinda Alm Lot,  MD       ALPRAZolam  (XANAX ) tablet 0.5 mg  0.5 mg Oral BID PRN Celinda Alm Lot, MD   0.5 mg at 10/05/24 2155   aspirin  EC tablet 81 mg  81 mg Oral Daily Celinda Alm Lot, MD   81 mg at 10/07/24 9045   buPROPion  (WELLBUTRIN  XL) 24 hr tablet 150 mg  150 mg Oral Daily Celinda Alm Lot, MD   150 mg at 10/07/24 9045   citalopram  (CELEXA ) tablet 40 mg  40 mg Oral Daily Celinda Alm Lot, MD   40 mg at 10/07/24  1025   irbesartan  (AVAPRO ) tablet 150 mg  150 mg Oral Daily Celinda Alm Lot, MD   150 mg at 10/05/24 9171   methocarbamol  (ROBAXIN ) tablet 500 mg  500 mg Oral Q6H PRN Cheryle Page, MD   500 mg at 10/07/24 1453   ondansetron  (ZOFRAN ) tablet 4 mg  4 mg Oral Q6H PRN Celinda Alm Lot, MD       Or   ondansetron  (ZOFRAN ) injection 4 mg  4 mg Intravenous Q6H PRN Celinda Alm Lot, MD       Oral care mouth rinse  15 mL Mouth Rinse PRN Celinda Alm Lot, MD       oxyCODONE  (Oxy IR/ROXICODONE ) immediate release tablet 5 mg  5 mg Oral Q4H PRN Celinda Alm Lot, MD   5 mg at 10/07/24 1453   pantoprazole  (PROTONIX ) EC tablet 40 mg  40 mg Oral BID Celinda Alm Lot, MD   40 mg at 10/07/24 9044   polyethylene glycol (MIRALAX  / GLYCOLAX ) packet 17 g  17 g Oral Daily PRN Celinda Alm Lot, MD         Discharge Medications: Please see discharge summary for a list of discharge medications.  Relevant Imaging Results:  Relevant Lab Results:   Additional Information SSN [id number]  Sonda Manuella Quill, RN

## 2024-10-07 NOTE — Plan of Care (Signed)

## 2024-10-07 NOTE — Progress Notes (Signed)
 PROGRESS NOTE    Jody Wilson  FMW:[id number] DOB: [birth year] DOA: 10/03/2024 PCP: Dettinger, Fonda LABOR, MD   Brief Narrative:  73 y.o. female with medical history significant of iron deficiency anemia, depression, aortic atherosclerosis, hyperlipidemia, carotid artery disease, hypertension, orthostatic hypotension, pulmonary nodule, history of smoking in remission, GERD, B12 deficiency, osteoporosis, vertebral fracture, right clavicular fracture who is scheduled for right clavicle fracture repair surgery on October 29, 2024 presented with a fall and right shoulder and right hip pain.  On presentation, imaging revealed acute fracture of the right proximal humerus along with acute fracture of the right inferior pubic ramus.  Orthopedics was consulted: Recommended conservative management.  Assessment & Plan:   Closed fracture of proximal end of right humerus Acute closed fracture of the right superior and inferior pubic rami along with nondisplaced fracture of the right sacral alla Fall -Orthopedics evaluation appreciated: Recommended nonoperative management with a sling and nonweightbearing for right humerus fracture and nonoperative management with weightbearing as tolerated for right lower extremity.  Outpatient follow-up with orthopedic/Dr. Onesimo - Fall precautions.  Pain management.  PT recommended SNF.  TOC consulted.  Currently medically stable for discharge to SNF  Osteoporosis - Outpatient follow-up with PCP.  Continue vitamin D  supplementation on discharge  Transient hypoxia -Possibly from pain.  Briefly required 2 L oxygen briefly decreased.   - Resolved  Elevated LFTs -Questionable cause.  Hold statin.  LFTs improving.  Monitor.  Sinus bradycardia - Currently rate controlled and heart rate in the 60s.  Coreg  on hold  Hypertension Hyperlipidemia/hypertriglyceridemia Thoracic aortic atherosclerosis- Blood pressure stable.  Continue irbesartan .  Coreg  plan as above -  Statin plan as above.  Not on ezetimibe  anymore as an outpatient  Anxiety/depression - Continue citalopram  and bupropion .  Continue alprazolam  as needed  GERD Hiatal hernia -Continue PPI  DVT prophylaxis: SCDs Code Status: Full Family Communication: None at bedside Disposition Plan: Status is: Observation The patient will require care spanning > 2 midnights and should be moved to inpatient because: Of severity of illness.  Need for SNF placement    Consultants: Orthopedics  Procedures: None  Antimicrobials: None   Subjective: Patient seen and examined at bedside.  Denies worsening shortness of breath, abdominal pain or vomiting.   Objective: Vitals:   10/06/24 1513 10/06/24 1900 10/06/24 2123 10/07/24 0450  BP: (!) 102/52 91/77 (!) 117/56 (!) 105/57  Pulse: 69 70 78 62  Resp: 16 20 17 17   Temp: 98.2 F (36.8 C) 97.9 F (36.6 C) (!) 97.4 F (36.3 C) 98.1 F (36.7 C)  TempSrc: Oral Oral Oral Oral  SpO2: 94% 96% 94% 94%  Weight:      Height:        Intake/Output Summary (Last 24 hours) at 10/07/2024 0800 Last data filed at 10/07/2024 0600 Gross per 24 hour  Intake 540 ml  Output 0 ml  Net 540 ml   Filed Weights   10/03/24 1711  Weight: 62.6 kg    Examination:  General: No acute distress.  On room air currently respiratory: Decreased breath sounds at bases bilaterally with some crackles CVS: S1 and S2 heard; rate mostly controlled  abdominal: Soft, nontender, still slightly distended; no organomegaly, normal bowel sounds heard.   Extremities: Trace lower extremity edema present; no cyanosis.  Right upper extremity is in a sling.  Data Reviewed: I have personally reviewed following labs and imaging studies  CBC: Recent Labs  Lab 10/03/24 1318 10/04/24 0413 10/05/24 0410  WBC 11.2* 6.5 7.0  NEUTROABS 8.9*  --  4.8  HGB 14.6 13.2 12.8  HCT 43.9 40.4 39.6  MCV 97.6 99.0 98.3  PLT 214 178 186   Basic Metabolic Panel: Recent Labs  Lab  10/03/24 1318 10/04/24 0413 10/05/24 0410  NA 141 138 139  K 4.4 4.4 3.9  CL 105 102 101  CO2 27 27 28   GLUCOSE 97 98 97  BUN 12 16 11   CREATININE 0.65 0.77 0.67  CALCIUM  9.1 9.0 9.0  MG  --   --  1.9   GFR: Estimated Creatinine Clearance: 53.9 mL/min (by C-G formula based on SCr of 0.67 mg/dL). Liver Function Tests: Recent Labs  Lab 10/03/24 1318 10/04/24 0413 10/05/24 0410  AST 22 111* 58*  ALT 18 81* 89*  ALKPHOS 71 108 150*  BILITOT 0.7 1.0 1.0  PROT 6.5 6.0* 5.9*  ALBUMIN 3.9 3.5 3.5   No results for input(s): LIPASE, AMYLASE in the last 168 hours. No results for input(s): AMMONIA in the last 168 hours. Coagulation Profile: No results for input(s): INR, PROTIME in the last 168 hours. Cardiac Enzymes: No results for input(s): CKTOTAL, CKMB, CKMBINDEX, TROPONINI in the last 168 hours. BNP (last 3 results) No results for input(s): PROBNP in the last 8760 hours. HbA1C: No results for input(s): HGBA1C in the last 72 hours. CBG: No results for input(s): GLUCAP in the last 168 hours. Lipid Profile: No results for input(s): CHOL, HDL, LDLCALC, TRIG, CHOLHDL, LDLDIRECT in the last 72 hours. Thyroid  Function Tests: No results for input(s): TSH, T4TOTAL, FREET4, T3FREE, THYROIDAB in the last 72 hours. Anemia Panel: No results for input(s): VITAMINB12, FOLATE, FERRITIN, TIBC, IRON, RETICCTPCT in the last 72 hours. Sepsis Labs: No results for input(s): PROCALCITON, LATICACIDVEN in the last 168 hours.  No results found for this or any previous visit (from the past 240 hours).       Radiology Studies: No results found.       Scheduled Meds:  aspirin  EC  81 mg Oral Daily   buPROPion   150 mg Oral Daily   citalopram   40 mg Oral Daily   irbesartan   150 mg Oral Daily   pantoprazole   40 mg Oral BID   Continuous Infusions:        Sophie Mao, MD [facility] 10/07/2024, 8:00 AM

## 2024-10-07 NOTE — TOC Initial Note (Signed)
 Transition of Care [facility]) - Initial/Assessment Note    Patient Details  Name: Jody Wilson MRN: [id number] Date of Birth: [birth year]  [facility] CM/SW Contact:    Sonda Manuella Quill, RN Phone Number: 10/07/2024, 1:54 PM  Clinical Narrative:                 IP CM for SNF; spoke w/ pt in room; pt said she lives at home w/ her husband; she plans to return after recc d/c to SNF; pt identified POC sons Lianne Carreto [phone]) and HILLARY Blush [phone]); pt verified insurance/PCP; she denied SDOH risks; pt has glasses and upper partial denture; she said she is continent of bowel/bladder, and she can feed herself; pt said she has dry skin; pt has cane, crutches, walker, and shower chair; she does not have HH services or home oxygen; pt agreed to recc SNF; explained SNF process; pt verbalized understanding and said she does not want facility in [place] or [place] because she lives in [place], [place]; pt said she has been to [facility] in the past; pt also said she does not want bed request to [facility]; [facility] completed and sent for co-sign; faxed out for bed offers; ins auth needed.  Expected Discharge Plan: Skilled Nursing Facility Barriers to Discharge: Continued Medical Work up   Patient Goals and CMS Choice Patient states their goals for this hospitalization and ongoing recovery are:: SNF CMS Medicare.gov Compare Post Acute Care list provided to:: Patient   [place] ownership interest in [facility].provided to:: Patient    Expected Discharge Plan and Services   Discharge Planning Services: CM Consult   Living arrangements for the past 2 months: Single Family Home                 DME Arranged: N/A DME Agency: NA       HH Arranged: NA HH Agency: NA        Prior Living Arrangements/Services Living arrangements for the past 2 months: Single Family Home Lives with:: Spouse Patient language and need for interpreter reviewed:: Yes Do  you feel safe going back to the place where you live?: Yes      Need for Family Participation in Patient Care: Yes (Comment) Care giver support system in place?: Yes (comment) Current home services: DME (cane, crutches, walker, shower chair) Criminal Activity/Legal Involvement Pertinent to Current Situation/Hospitalization: No - Comment as needed  Activities of Daily Living   ADL Screening (condition at time of admission) Independently performs ADLs?: Yes (appropriate for developmental age) Is the patient deaf or have difficulty hearing?: No Does the patient have difficulty seeing, even when wearing glasses/contacts?: No Does the patient have difficulty concentrating, remembering, or making decisions?: No  Permission Sought/Granted Permission sought to share information with : Case Manager Permission granted to share information with : Yes, Verbal Permission Granted  Share Information with NAME: Case Manager     Permission granted to share info w Relationship: Alexzandra Bilton (son) [phone] / SHIRLEEN MCFAUL [facility]     Emotional Assessment Appearance:: Appears stated age Attitude/Demeanor/Rapport: Gracious Affect (typically observed): Accepting Orientation: : Oriented to Self, Oriented to Place, Oriented to  Time, Oriented to Situation Alcohol / Substance Use: Not Applicable Psych Involvement: No (comment)  Admission diagnosis:  Fall, initial encounter [W19.XXXA] Closed fracture of proximal end of right humerus [S42.201A] Closed fracture of right inferior pubic ramus, initial encounter (HCC) [S32.591A] Closed fracture of proximal end of right humerus, unspecified fracture  morphology, initial encounter [S42.201A] Patient Active Problem List   Diagnosis Date Noted   Closed fracture of proximal end of right humerus 10/03/2024   Closed fracture of right inferior pubic ramus (HCC) 10/03/2024   Sinus bradycardia 10/03/2024   NSTEMI (non-ST elevated myocardial infarction) (HCC)  06/29/2024   Syncope 06/27/2024   Elevated troponin 06/27/2024   Acute respiratory failure with hypoxia (HCC) 06/27/2024   Abnormal CT of liver 06/26/2024   Incontinence of feces 05/08/2024   Closed fracture of neck of left femur (HCC) 04/13/2024   Hypokalemia 04/13/2024   Closed hip fracture requiring operative repair with routine healing 02/09/2023   Pulmonary nodule 02/09/2023   Hypertriglyceridemia 01/22/2022   Abnormal findings on diagnostic imaging of liver and biliary tract 12/09/2021   Diarrhea 12/09/2021   History of colonic polyps 12/09/2021   Anxiety and depression 06/23/2020   Hiatal hernia 06/23/2020   Thoracic aortic atherosclerosis 06/23/2020   Smoking history 04/25/2020   Involuntary muscle jerks while sleeping 09/26/2019   B12 deficiency 09/25/2019   Iron deficiency anemia 09/25/2019   Elevated liver function tests 09/07/2019   Osteoporosis 02/09/2017   History of vertebral fracture 02/09/2017   GERD (gastroesophageal reflux disease) 03/11/2016   Hyperlipidemia with target LDL less than 100 02/19/2015   Hypertension 03/18/2014   PCP:  Dettinger, Fonda LABOR, MD Pharmacy:   [facility] [place], [place] - [address] [place] [address] Phone: [phone] Fax: [phone]     Social Drivers of Health (SDOH) Social History: SDOH Screenings   Food Insecurity: No Food Insecurity (10/07/2024)  Housing: Low Risk  (10/07/2024)  Transportation Needs: No Transportation Needs (10/07/2024)  Utilities: Not At Risk (10/07/2024)  Alcohol Screen: Low Risk  (12/27/2023)  Depression (PHQ2-9): Low Risk  (07/24/2024)  Recent Concern: Depression (PHQ2-9) - High Risk (07/06/2024)  Financial Resource Strain: Low Risk  (12/27/2023)  Physical Activity: Inactive (12/27/2023)  Social Connections: Socially Integrated (10/03/2024)  Stress: No Stress Concern Present (12/27/2023)  Tobacco Use: Medium Risk (10/03/2024)  Health Literacy: Adequate  Health Literacy (12/27/2023)   SDOH Interventions: Food Insecurity Interventions: Intervention Not Indicated, Inpatient TOC Housing Interventions: Intervention Not Indicated, Inpatient TOC Transportation Interventions: Intervention Not Indicated, Inpatient TOC Utilities Interventions: Intervention Not Indicated, Inpatient TOC   Readmission Risk Interventions    04/13/2024    9:01 AM  Readmission Risk Prevention Plan  Medication Screening Complete  Transportation Screening Complete

## 2024-10-08 DIAGNOSIS — S42201A Unspecified fracture of upper end of right humerus, initial encounter for closed fracture: Secondary | ICD-10-CM | POA: Diagnosis not present

## 2024-10-08 MED ORDER — CITALOPRAM HYDROBROMIDE 20 MG PO TABS
20.0000 mg | ORAL_TABLET | Freq: Every day | ORAL | Status: DC
  Administered 2024-10-09 – 2024-10-10 (×2): 20 mg via ORAL
  Filled 2024-10-08 (×2): qty 1

## 2024-10-08 NOTE — Plan of Care (Signed)
   Problem: Activity: Goal: Risk for activity intolerance will decrease Outcome: Progressing   Problem: Pain Managment: Goal: General experience of comfort will improve and/or be controlled Outcome: Progressing   Problem: Safety: Goal: Ability to remain free from injury will improve Outcome: Progressing

## 2024-10-08 NOTE — Progress Notes (Signed)
 Occupational Therapy Treatment Patient Details Name: Jody Wilson MRN: [id number] DOB: 05-Dec-1951 Today's Date: 10/08/2024   History of present illness 73 yo female who  tripped and fell while in radiology [facility]. She had immediate right shoulder pain and right hip pain. She was brought to the ED where x-rays showed a humerus and a pubic ramus fx and orthopedic surgery was consulted.  PMH: L hip fx 01/2023 s/p hemiarthroplasty, R THA, GAD, depression, tobacco use, orthostatic hypotension   OT comments  Pt put forth good efforts throughout, pre-medication received prior to session with pt reporting 7/10 pain. Pt transfers from recliner > BSC MAX A +1 with SPT, STS using RW and use of LUE with steadying assist on RW 2/2 RUE NWB in sling throughout. Requires +2 for pericare and clothing management (MAX A +1 for standing). Pt with poor standing tolerance due to pain. TOTAL A +2 to return to supine, edu on breathing strategies for pain reduction with pt verbalizing understanding. OT will follow acutely, discharge recommendation appropriate.       If plan is discharge home, recommend the following:  A lot of help with walking and/or transfers;A lot of help with bathing/dressing/bathroom;Assist for transportation   Equipment Recommendations  Wheelchair (measurements OT);BSC/3in1    Recommendations for Other Services      Precautions / Restrictions Precautions Precautions: Fall Recall of Precautions/Restrictions: Intact Restrictions Weight Bearing Restrictions Per Provider Order: Yes RUE Weight Bearing Per Provider Order: Non weight bearing RLE Weight Bearing Per Provider Order: Weight bearing as tolerated       Mobility Bed Mobility Overal bed mobility: Needs Assistance Bed Mobility: Sit to Supine       Sit to supine: Total assist, +2 for physical assistance, +2 for safety/equipment, HOB elevated, Used rails   General bed mobility comments: cues for relaxation, pt tensing in pain with  guarding. edu provided on breathing techniques to reduce pain    Transfers Overall transfer level: Needs assistance Equipment used: Rolling walker (2 wheels), None Transfers: Sit to/from Stand, Bed to chair/wheelchair/BSC Sit to Stand: Max assist, +2 physical assistance, +2 safety/equipment Stand pivot transfers: Max assist         General transfer comment: SPT recliner > BSC. STS with RW (LUE placement only due to RUE NWB in sling)     Balance Overall balance assessment: Needs assistance Sitting-balance support: Single extremity supported, Feet supported, No upper extremity supported Sitting balance-Leahy Scale: Fair     Standing balance support: Reliant on assistive device for balance, During functional activity Standing balance-Leahy Scale: Poor Standing balance comment: reliant on L UE and external assist for static stand and pivot                           ADL either performed or assessed with clinical judgement   ADL Overall ADL's : Needs assistance/impaired                         Toilet Transfer: Maximal assistance;+2 for physical assistance;+2 for safety/equipment;Cueing for safety;Cueing for sequencing;Stand-pivot;BSC/3in1 Toilet Transfer Details (indicate cue type and reason): SPT recliner > BSC, pt with minimal tolerance to take steps with R hip pain. pt able to assist with shuffling BLE and hand placement on BSC. RUE in sling with NWB maintained. BSC and bed switched with pt standing as pt requests to return to bed Toileting- Clothing Manipulation and Hygiene: Maximal assistance;Sit to/from stand;+2 for safety/equipment;+2 for physical  assistance Toileting - Clothing Manipulation Details (indicate cue type and reason): maxA to stand with +2 assisting with pericare     Functional mobility during ADLs: Maximal assistance;+2 for physical assistance;+2 for safety/equipment General ADL Comments: stedy would benefit for future transfers (could not  locate lift)     Communication Communication Communication: No apparent difficulties   Cognition Arousal: Alert Behavior During Therapy: WFL for tasks assessed/performed Cognition: No apparent impairments             OT - Cognition Comments: good recall of precautions                 Following commands: Intact        Cueing   Cueing Techniques: Verbal cues        General Comments SpO2 on 2L on arrival, decreased to 85% after transfer. SpO2 on 4L with O2 93% end of session. RN aware.    Pertinent Vitals/ Pain       Pain Assessment Pain Assessment: 0-10 Pain Score: 7  Pain Location: right shoulder R hip Pain Descriptors / Indicators: Aching, Sore Pain Intervention(s): Limited activity within patient's tolerance, Monitored during session, Premedicated before session, Repositioned, Relaxation   Frequency  Min 2X/week        Progress Toward Goals  OT Goals(current goals can now be found in the care plan section)  Progress towards OT goals: Progressing toward goals  Acute Rehab OT Goals OT Goal Formulation: With patient Time For Goal Achievement: 10/20/24 Potential to Achieve Goals: Good ADL Goals Pt Will Perform Grooming: with set-up;standing Pt Will Perform Upper Body Dressing: with contact guard assist;sitting Pt Will Transfer to Toilet: with min assist;stand pivot transfer;bedside commode  Plan      AM-PAC OT 6 Clicks Daily Activity     Outcome Measure   Help from another person eating meals?: A Little Help from another person taking care of personal grooming?: A Little Help from another person toileting, which includes using toliet, bedpan, or urinal?: A Lot Help from another person bathing (including washing, rinsing, drying)?: A Lot Help from another person to put on and taking off regular upper body clothing?: A Lot Help from another person to put on and taking off regular lower body clothing?: A Lot 6 Click Score: 14    End of Session  Equipment Utilized During Treatment: Gait belt;Rolling walker (2 wheels);Oxygen  OT Visit Diagnosis: Other abnormalities of gait and mobility (R26.89);Muscle weakness (generalized) (M62.81);History of falling (Z91.81);Pain Pain - Right/Left: Right Pain - part of body: Shoulder;Arm;Hip   Activity Tolerance Patient limited by pain;Patient tolerated treatment well   Patient Left in bed;with call bell/phone within reach;with bed alarm set   Nurse Communication Mobility status        Time: 8675-8642 OT Time Calculation (min): 33 min  Charges: OT General Charges $OT Visit: 1 Visit OT Treatments $Self Care/Home Management : 23-37 mins Hy Swiatek L. Axyl Sitzman, OTR/L  10/08/24, 2:37 PM

## 2024-10-08 NOTE — Progress Notes (Signed)
 PROGRESS NOTE    Jody Wilson  FMW:[id number] DOB: [birth year] DOA: 10/03/2024 PCP: Dettinger, Fonda LABOR, MD   Brief Narrative:  73 y.o. female with medical history significant of iron deficiency anemia, depression, aortic atherosclerosis, hyperlipidemia, carotid artery disease, hypertension, orthostatic hypotension, pulmonary nodule, history of smoking in remission, GERD, B12 deficiency, osteoporosis, vertebral fracture, right clavicular fracture who is scheduled for right clavicle fracture repair surgery on October 29, 2024 presented with a fall and right shoulder and right hip pain.  On presentation, imaging revealed acute fracture of the right proximal humerus along with acute fracture of the right inferior pubic ramus.  Orthopedics was consulted: Recommended conservative management.  Assessment & Plan:   Closed fracture of proximal end of right humerus Acute closed fracture of the right superior and inferior pubic rami along with nondisplaced fracture of the right sacral alla Fall -Orthopedics evaluation appreciated: Recommended nonoperative management with a sling and nonweightbearing for right humerus fracture and nonoperative management with weightbearing as tolerated for right lower extremity.  Outpatient follow-up with orthopedic/Dr. Onesimo - Fall precautions.  Pain management.  PT recommended SNF.  TOC consulted.  Currently medically stable for discharge to SNF  Osteoporosis - Outpatient follow-up with PCP.  Continue vitamin D  supplementation on discharge  Acute respiratory failure with hypoxia - Still requiring oxygen supplementation via nasal cannula at 2 to 3 L/min with intermittent tachypnea.  Possibly from pain and atelectasis.  Wean off as able.  Incentive spirometry.  Elevated LFTs -Questionable cause.  Hold statin.  LFTs improving.  Monitor.  Sinus bradycardia - Currently rate controlled and heart rate in the 60s.  Coreg  on  hold  Hypertension Hyperlipidemia/hypertriglyceridemia Thoracic aortic atherosclerosis- Blood pressure stable.  Continue irbesartan .  Coreg  plan as above - Statin plan as above.  Not on ezetimibe  anymore as an outpatient  Anxiety/depression - Continue citalopram  and bupropion .  Continue alprazolam  as needed  GERD Hiatal hernia -Continue PPI  DVT prophylaxis: SCDs Code Status: Full Family Communication: None at bedside Disposition Plan: Status is: Observation The patient will require care spanning > 2 midnights and should be moved to inpatient because: Of severity of illness.  Need for SNF placement    Consultants: Orthopedics  Procedures: None  Antimicrobials: None   Subjective: Patient seen and examined at bedside.  No vomiting, abdominal pain or chest pain reported.   Objective: Vitals:   10/07/24 1409 10/07/24 2134 10/08/24 0525 10/08/24 0721  BP: (!) 111/47 (!) 111/50 (!) 112/54 (!) 115/54  Pulse: (!) 59 69 76 71  Resp: 16 18 16 18   Temp: 97.7 F (36.5 C) 99.3 F (37.4 C) 98.5 F (36.9 C) 98.7 F (37.1 C)  TempSrc: Oral Oral Oral Oral  SpO2: 96% 94% 95% 91%  Weight:      Height:        Intake/Output Summary (Last 24 hours) at 10/08/2024 0754 Last data filed at 10/08/2024 0600 Gross per 24 hour  Intake 480 ml  Output 1100 ml  Net -620 ml   Filed Weights   10/03/24 1711  Weight: 62.6 kg    Examination:  General: Remains on room air and in no distress respiratory: Bilateral decreased breath sounds at bases with scattered crackles CVS: Currently rate controlled; S1-S2 heard normally  abdominal: Soft, nontender, distended slightly; no organomegaly, sounds are normally heard.   Extremities: No clubbing.  Mild lower extremity edema present.  Right upper extremity remains in a sling  Data Reviewed: I have personally reviewed following labs and imaging studies  CBC: Recent Labs  Lab 10/03/24 1318 10/04/24 0413 10/05/24 0410  WBC 11.2* 6.5 7.0   NEUTROABS 8.9*  --  4.8  HGB 14.6 13.2 12.8  HCT 43.9 40.4 39.6  MCV 97.6 99.0 98.3  PLT 214 178 186   Basic Metabolic Panel: Recent Labs  Lab 10/03/24 1318 10/04/24 0413 10/05/24 0410  NA 141 138 139  K 4.4 4.4 3.9  CL 105 102 101  CO2 27 27 28   GLUCOSE 97 98 97  BUN 12 16 11   CREATININE 0.65 0.77 0.67  CALCIUM  9.1 9.0 9.0  MG  --   --  1.9   GFR: Estimated Creatinine Clearance: 53.9 mL/min (by C-G formula based on SCr of 0.67 mg/dL). Liver Function Tests: Recent Labs  Lab 10/03/24 1318 10/04/24 0413 10/05/24 0410  AST 22 111* 58*  ALT 18 81* 89*  ALKPHOS 71 108 150*  BILITOT 0.7 1.0 1.0  PROT 6.5 6.0* 5.9*  ALBUMIN 3.9 3.5 3.5   No results for input(s): LIPASE, AMYLASE in the last 168 hours. No results for input(s): AMMONIA in the last 168 hours. Coagulation Profile: No results for input(s): INR, PROTIME in the last 168 hours. Cardiac Enzymes: No results for input(s): CKTOTAL, CKMB, CKMBINDEX, TROPONINI in the last 168 hours. BNP (last 3 results) No results for input(s): PROBNP in the last 8760 hours. HbA1C: No results for input(s): HGBA1C in the last 72 hours. CBG: No results for input(s): GLUCAP in the last 168 hours. Lipid Profile: No results for input(s): CHOL, HDL, LDLCALC, TRIG, CHOLHDL, LDLDIRECT in the last 72 hours. Thyroid  Function Tests: No results for input(s): TSH, T4TOTAL, FREET4, T3FREE, THYROIDAB in the last 72 hours. Anemia Panel: No results for input(s): VITAMINB12, FOLATE, FERRITIN, TIBC, IRON, RETICCTPCT in the last 72 hours. Sepsis Labs: No results for input(s): PROCALCITON, LATICACIDVEN in the last 168 hours.  No results found for this or any previous visit (from the past 240 hours).       Radiology Studies: No results found.       Scheduled Meds:  aspirin  EC  81 mg Oral Daily   buPROPion   150 mg Oral Daily   citalopram   40 mg Oral Daily   irbesartan   150  mg Oral Daily   pantoprazole   40 mg Oral BID   Continuous Infusions:        Sophie Mao, MD [facility] 10/08/2024, 7:54 AM

## 2024-10-08 NOTE — Progress Notes (Signed)
 Physical Therapy Treatment Patient Details Name: Jody Wilson MRN: [id number] DOB: Jul 18, 1951 Today's Date: 10/08/2024   History of Present Illness 73 yo female who  tripped and fell while in radiology [facility]. She had immediate right shoulder pain and right hip pain. She was brought to the ED where x-rays showed a humerus and a pubic ramus fx and orthopedic surgery was consulted.  PMH: L hip fx 01/2023 s/p hemiarthroplasty, R THA, GAD, depression, tobacco use, orthostatic hypotension    PT Comments  Pt progressing toward goals, improved effort and ability to assist with sit to stand and pivot transfers; pain remains limiting factor, SpO2=93% on 2L. D/c plan remains appropriate.    If plan is discharge home, recommend the following: Assist for transportation;Help with stairs or ramp for entrance;Two people to help with walking and/or transfers;Two people to help with bathing/dressing/bathroom   Can travel by private vehicle     No  Equipment Recommendations  None recommended by PT    Recommendations for Other Services       Precautions / Restrictions Precautions Precautions: Fall Recall of Precautions/Restrictions: Intact Restrictions RUE Weight Bearing Per Provider Order: Non weight bearing RLE Weight Bearing Per Provider Order: Weight bearing as tolerated     Mobility  Bed Mobility Overal bed mobility: Needs Assistance Bed Mobility: Supine to Sit     Supine to sit: Max assist, Total assist, +2 for physical assistance, +2 for safety/equipment     General bed mobility comments: assist at trunk and BLE, incr time d/t pain    Transfers Overall transfer level: Needs assistance Equipment used: Rolling walker (2 wheels) Transfers: Sit to/from Stand Sit to Stand: Mod assist, Max assist, +2 physical assistance, +2 safety/equipment           General transfer comment: cues for hand placement and foot position; assist to rise - L hand drive on RW only; assist to balance adn  maneuver RW for stand pivot    Ambulation/Gait                   Stairs             Wheelchair Mobility     Tilt Bed    Modified Rankin (Stroke Patients Only)       Balance Overall balance assessment: Needs assistance Sitting-balance support: Single extremity supported, Feet supported, No upper extremity supported Sitting balance-Leahy Scale: Fair     Standing balance support: Reliant on assistive device for balance, During functional activity Standing balance-Leahy Scale: Poor Standing balance comment: reliant on L UE and external assist for static stand and pivot                            Communication Communication Communication: No apparent difficulties  Cognition Arousal: Alert Behavior During Therapy: WFL for tasks assessed/performed   PT - Cognitive impairments: No apparent impairments                         Following commands: Intact      Cueing Cueing Techniques: Verbal cues  Exercises      General Comments        Pertinent Vitals/Pain Pain Assessment Pain Assessment: 0-10 Pain Score: 10-Worst pain ever Pain Location: right shoulder R hip Pain Descriptors / Indicators: Aching, Sore Pain Intervention(s): Limited activity within patient's tolerance, Monitored during session, Premedicated before session, Repositioned    Home Living  Prior Function            PT Goals (current goals can now be found in the care plan section) Acute Rehab PT Goals Patient Stated Goal: have less pain PT Goal Formulation: With patient Time For Goal Achievement: 10/19/24 Potential to Achieve Goals: Good Progress towards PT goals: Progressing toward goals    Frequency    Min 3X/week      PT Plan      Co-evaluation              AM-PAC PT 6 Clicks Mobility   Outcome Measure  Help needed turning from your back to your side while in a flat bed without using bedrails?:  Total Help needed moving from lying on your back to sitting on the side of a flat bed without using bedrails?: Total Help needed moving to and from a bed to a chair (including a wheelchair)?: Total Help needed standing up from a chair using your arms (e.g., wheelchair or bedside chair)?: Total Help needed to walk in hospital room?: Total Help needed climbing 3-5 steps with a railing? : Total 6 Click Score: 6    End of Session Equipment Utilized During Treatment: Other (comment) (sling) Activity Tolerance: Patient tolerated treatment well;Patient limited by pain Patient left: in chair;with call bell/phone within reach;with chair alarm set Nurse Communication: Mobility status PT Visit Diagnosis: Other abnormalities of gait and mobility (R26.89);Repeated falls (R29.6)     Time: 8782-8759 PT Time Calculation (min) (ACUTE ONLY): 23 min  Charges:    $Therapeutic Activity: 23-37 mins PT General Charges $$ ACUTE PT VISIT: 1 Visit                     Moyses Pavey, PT  Acute Rehab Dept [facility]) [phone]  10/08/2024    [facility] 10/08/2024, 2:15 PM

## 2024-10-08 NOTE — TOC Progression Note (Signed)
 Transition of Care [facility]) - Progression Note    Patient Details  Name: Jody Wilson MRN: [id number] Date of Birth: 02-17-1951  [facility] CM/SW Contact  Heather DELENA Saltness, LCSW Phone Number: 10/08/2024, 11:47 AM  Clinical Narrative:    CSW met with pt at bedside to discuss bed availability for SNF rehab and obtain pt's preferred facility. CSW provided pt with list of facilities with available beds, including name of facility, location, and Medicare Star-Ratings. Pt reports she would like to review the bed offers with family prior to making final decision. TOC will follow up with pt tomorrow for facility choice.   Medicare Star-Ratings  [facility] [address] [place], [place] [address] [phone] Overall rating ??  [facility] [address] [place], [place] [address] [phone] Overall rating ? Much below average  [facility] [address] [place], [place] [address] [phone] Overall rating ????? Much above average  [facility] and [facility] [address] [place], [place] [address] [phone] Overall rating ????? Much Above average  [facility] [address] [place], [place] [address] [phone] Overall rating ??? Average  [facility] and [facility] [address] [place], [place] [address] [phone] Rating: ?  Expected Discharge Plan: Skilled Nursing Facility Barriers to Discharge: Continued Medical Work up  Expected Discharge Plan and Services   Discharge Planning Services: CM Consult   Living arrangements for the past 2 months: Single Family Home                 DME Arranged: N/A DME Agency: NA       HH Arranged: NA HH Agency: NA         Social Drivers of Health (SDOH) Interventions SDOH Screenings   Food Insecurity: No Food Insecurity (10/07/2024)  Housing: Low Risk   (10/07/2024)  Transportation Needs: No Transportation Needs (10/07/2024)  Utilities: Not At Risk (10/07/2024)  Alcohol Screen: Low Risk  (12/27/2023)  Depression (PHQ2-9): Low Risk  (07/24/2024)  Recent Concern: Depression (PHQ2-9) - High Risk (07/06/2024)  Financial Resource Strain: Low Risk  (12/27/2023)  Physical Activity: Inactive (12/27/2023)  Social Connections: Socially Integrated (10/03/2024)  Stress: No Stress Concern Present (12/27/2023)  Tobacco Use: Medium Risk (10/03/2024)  Health Literacy: Adequate Health Literacy (12/27/2023)    Readmission Risk Interventions    04/13/2024    9:01 AM  Readmission Risk Prevention Plan  Medication Screening Complete  Transportation Screening Complete   Signed: Heather Saltness, MSW, LCSW Clinical Social Worker Inpatient Care Management 10/08/2024 2:33 PM

## 2024-10-09 ENCOUNTER — Inpatient Hospital Stay: Admitting: Oncology

## 2024-10-09 DIAGNOSIS — S42201A Unspecified fracture of upper end of right humerus, initial encounter for closed fracture: Secondary | ICD-10-CM | POA: Diagnosis not present

## 2024-10-09 NOTE — TOC Progression Note (Signed)
 Transition of Care [facility]) - Progression Note    Patient Details  Name: Jody Wilson MRN: [id number] Date of Birth: 02-16-1951  Transition of Care [facility]) CM/SW Contact  NORMAN ASPEN, LCSW Phone Number: 10/09/2024, 2:49 PM  Clinical Narrative:     Pt has accepted SNF bed at [facility] and facility has confirmed bed ready tomorrow.  Have begun insurance authorization.  MD/RN aware.  Expected Discharge Plan: Skilled Nursing Facility Barriers to Discharge: Continued Medical Work up               Expected Discharge Plan and Services   Discharge Planning Services: CM Consult   Living arrangements for the past 2 months: Single Family Home                 DME Arranged: N/A DME Agency: NA       HH Arranged: NA HH Agency: NA         Social Drivers of Health (SDOH) Interventions SDOH Screenings   Food Insecurity: No Food Insecurity (10/07/2024)  Housing: Low Risk  (10/07/2024)  Transportation Needs: No Transportation Needs (10/07/2024)  Utilities: Not At Risk (10/07/2024)  Alcohol Screen: Low Risk  (12/27/2023)  Depression (PHQ2-9): Low Risk  (07/24/2024)  Recent Concern: Depression (PHQ2-9) - High Risk (07/06/2024)  Financial Resource Strain: Low Risk  (12/27/2023)  Physical Activity: Inactive (12/27/2023)  Social Connections: Socially Integrated (10/03/2024)  Stress: No Stress Concern Present (12/27/2023)  Tobacco Use: Medium Risk (10/03/2024)  Health Literacy: Adequate Health Literacy (12/27/2023)    Readmission Risk Interventions    04/13/2024    9:01 AM  Readmission Risk Prevention Plan  Medication Screening Complete  Transportation Screening Complete

## 2024-10-09 NOTE — Progress Notes (Signed)
 PROGRESS NOTE    Jody Wilson  FMW:[id number] DOB: [birth year] DOA: 10/03/2024 PCP: Dettinger, Fonda LABOR, MD   Brief Narrative:  73 y.o. female with medical history significant of iron deficiency anemia, depression, aortic atherosclerosis, hyperlipidemia, carotid artery disease, hypertension, orthostatic hypotension, pulmonary nodule, history of smoking in remission, GERD, B12 deficiency, osteoporosis, vertebral fracture, right clavicular fracture who is scheduled for right clavicle fracture repair surgery on October 29, 2024 presented with a fall and right shoulder and right hip pain.  On presentation, imaging revealed acute fracture of the right proximal humerus along with acute fracture of the right inferior pubic ramus.  Orthopedics was consulted: Recommended conservative management.  PT recommended SNF placement.  TOC following.  Currently medically stable for discharge to SNF.  Assessment & Plan:   Closed fracture of proximal end of right humerus Acute closed fracture of the right superior and inferior pubic rami along with nondisplaced fracture of the right sacral alla Fall -Orthopedics evaluation appreciated: Recommended nonoperative management with a sling and nonweightbearing for right humerus fracture and nonoperative management with weightbearing as tolerated for right lower extremity.  Outpatient follow-up with orthopedic/Dr. Onesimo - Fall precautions.  Pain management.  PT recommended SNF.  TOC consulted.  Currently medically stable for discharge to SNF  Osteoporosis - Outpatient follow-up with PCP.  Continue vitamin D  supplementation on discharge  Acute respiratory failure with hypoxia - Still requiring oxygen supplementation via nasal cannula at 2 to 3 L/min with intermittent tachypnea.  Possibly from pain and atelectasis.  Wean off as able.  Incentive spirometry.  Elevated LFTs -Questionable cause.  Hold statin.  LFTs improving.  Monitor intermittently.  Sinus  bradycardia - Currently rate controlled and heart rate in the 60s.  Coreg  on hold  Hypertension Hyperlipidemia/hypertriglyceridemia Thoracic aortic atherosclerosis- Blood pressure stable.  Continue irbesartan .  Coreg  plan as above - Statin plan as above.  Not on ezetimibe  anymore as an outpatient  Anxiety/depression - Continue citalopram  and bupropion .  Continue alprazolam  as needed  GERD Hiatal hernia -Continue PPI  DVT prophylaxis: SCDs Code Status: Full Family Communication: None at bedside Disposition Plan: Status is: Observation The patient will require care spanning > 2 midnights and should be moved to inpatient because: Of severity of illness.  Need for SNF placement    Consultants: Orthopedics  Procedures: None  Antimicrobials: None   Subjective: Patient seen and examined at bedside.  Has intermittent right shoulder and hip pain.  No fever, vomiting, abdominal pain reported.   Objective: Vitals:   10/08/24 0525 10/08/24 0721 10/08/24 2047 10/09/24 0630  BP: (!) 112/54 (!) 115/54 112/66 106/63  Pulse: 76 71 67 62  Resp: 16 18 18 18   Temp: 98.5 F (36.9 C) 98.7 F (37.1 C) 98.4 F (36.9 C) 97.7 F (36.5 C)  TempSrc: Oral Oral    SpO2: 95% 91% 96% 93%  Weight:      Height:        Intake/Output Summary (Last 24 hours) at 10/09/2024 0817 Last data filed at 10/09/2024 0600 Gross per 24 hour  Intake 480 ml  Output --  Net 480 ml   Filed Weights   10/03/24 1711  Weight: 62.6 kg    Examination:  General: Currently in no distress.  On room air. respiratory: Decreased breath sounds at bases bilaterally, no wheezing CVS: S1-S2 heard; rate mostly controlled  abdominal: Soft, nontender, remains  slightly distended; no organomegaly, bowel sounds are normally heard Extremities: Trace lower extremity edema present.  No cyanosis.  Right  upper extremity is in a sling  Data Reviewed: I have personally reviewed following labs and imaging  studies  CBC: Recent Labs  Lab 10/03/24 1318 10/04/24 0413 10/05/24 0410  WBC 11.2* 6.5 7.0  NEUTROABS 8.9*  --  4.8  HGB 14.6 13.2 12.8  HCT 43.9 40.4 39.6  MCV 97.6 99.0 98.3  PLT 214 178 186   Basic Metabolic Panel: Recent Labs  Lab 10/03/24 1318 10/04/24 0413 10/05/24 0410  NA 141 138 139  K 4.4 4.4 3.9  CL 105 102 101  CO2 27 27 28   GLUCOSE 97 98 97  BUN 12 16 11   CREATININE 0.65 0.77 0.67  CALCIUM  9.1 9.0 9.0  MG  --   --  1.9   GFR: Estimated Creatinine Clearance: 53.9 mL/min (by C-G formula based on SCr of 0.67 mg/dL). Liver Function Tests: Recent Labs  Lab 10/03/24 1318 10/04/24 0413 10/05/24 0410  AST 22 111* 58*  ALT 18 81* 89*  ALKPHOS 71 108 150*  BILITOT 0.7 1.0 1.0  PROT 6.5 6.0* 5.9*  ALBUMIN 3.9 3.5 3.5   No results for input(s): LIPASE, AMYLASE in the last 168 hours. No results for input(s): AMMONIA in the last 168 hours. Coagulation Profile: No results for input(s): INR, PROTIME in the last 168 hours. Cardiac Enzymes: No results for input(s): CKTOTAL, CKMB, CKMBINDEX, TROPONINI in the last 168 hours. BNP (last 3 results) No results for input(s): PROBNP in the last 8760 hours. HbA1C: No results for input(s): HGBA1C in the last 72 hours. CBG: No results for input(s): GLUCAP in the last 168 hours. Lipid Profile: No results for input(s): CHOL, HDL, LDLCALC, TRIG, CHOLHDL, LDLDIRECT in the last 72 hours. Thyroid  Function Tests: No results for input(s): TSH, T4TOTAL, FREET4, T3FREE, THYROIDAB in the last 72 hours. Anemia Panel: No results for input(s): VITAMINB12, FOLATE, FERRITIN, TIBC, IRON, RETICCTPCT in the last 72 hours. Sepsis Labs: No results for input(s): PROCALCITON, LATICACIDVEN in the last 168 hours.  No results found for this or any previous visit (from the past 240 hours).       Radiology Studies: No results found.       Scheduled Meds:  aspirin  EC   81 mg Oral Daily   buPROPion   150 mg Oral Daily   citalopram   20 mg Oral Daily   irbesartan   150 mg Oral Daily   pantoprazole   40 mg Oral BID   Continuous Infusions:        Sophie Mao, MD [facility] 10/09/2024, 8:17 AM

## 2024-10-09 NOTE — Progress Notes (Signed)
 Occupational Therapy Treatment Patient Details Name: Jody Wilson MRN: [id number] DOB: 01/14/1951 Today's Date: 10/09/2024   History of present illness 73 yo female who  tripped and fell while in radiology [facility]. She had immediate right shoulder pain and right hip pain. She was brought to the ED where x-rays showed a humerus and a pubic ramus fx and orthopedic surgery was consulted.  PMH: L hip fx 01/2023 s/p hemiarthroplasty, R THA, GAD, depression, tobacco use, orthostatic hypotension   OT comments  Pt making progress towards goals, limited by 6 to 7/10 pain throughout despite medication. Pt requires MAX A +2 for bed mobility, cues for relaxation and breathing techniques due to R hip discomfort, and MOD A +2 to perform STS using STEDY for transfers bed > BSC > recliner. Pt tolerates standing for 3 mins for TOTAL A pericare after continent urine void. OT educates on compensatory strategies for UB dressing & and hand/wrist AROM to tolerance given RUE NWB in sling with pt verbalizing understanding. SpO2 on 3L via [place] 93%> throughout. Discharge recommendation appropriate, OT will follow acutely. Patient will benefit from continued inpatient follow up therapy, <3 hours/day       If plan is discharge home, recommend the following:  A lot of help with walking and/or transfers;A lot of help with bathing/dressing/bathroom;Assist for transportation   Equipment Recommendations  Wheelchair (measurements OT);BSC/3in1    Recommendations for Other Services      Precautions / Restrictions Precautions Precautions: Fall Recall of Precautions/Restrictions: Intact Restrictions Weight Bearing Restrictions Per Provider Order: No RUE Weight Bearing Per Provider Order: Non weight bearing RLE Weight Bearing Per Provider Order: Weight bearing as tolerated       Mobility Bed Mobility Overal bed mobility: Needs Assistance Bed Mobility: Sit to Supine     Supine to sit: Max assist, +2 for physical  assistance, +2 for safety/equipment, Used rails, HOB elevated     General bed mobility comments: pt able to initiate BLE movement but ultimately requires MAX A +2 to transition from supine to sitting EOB    Transfers Overall transfer level: Needs assistance Equipment used: Ambulation equipment used Transfers: Sit to/from Stand, Bed to chair/wheelchair/BSC Sit to Stand: Mod assist, +2 physical assistance Stand pivot transfers: Mod assist, +2 physical assistance         General transfer comment: STS using stedy transfer from bed > BSC > reclienr Transfer via Lift Equipment: Stedy   Balance Overall balance assessment: Needs assistance Sitting-balance support: Single extremity supported, Feet supported, No upper extremity supported Sitting balance-Leahy Scale: Fair     Standing balance support: Reliant on assistive device for balance, During functional activity Standing balance-Leahy Scale: Poor Standing balance comment: reliant on LUE and minimal standing tolerance due to pain in R pelvic area                           ADL either performed or assessed with clinical judgement   ADL Overall ADL's : Needs assistance/impaired                   Upper Body Dressing Details (indicate cue type and reason): edu on compensatory UB dressing strategies and rec for button up shirts at STR. discused AROM of hand/wrist to prevent stiffness     Toilet Transfer: Moderate assistance;Cueing for sequencing;Cueing for safety;+2 for physical assistance;BSC/3in1 (STEDY) Toilet Transfer Details (indicate cue type and reason): STEDY used for STS using LUE to pull up (RUE NWB in  sling), MOD A +2 to rise from elevated bed and cues to control descent to lower BSC. Toileting- Clothing Manipulation and Hygiene: Maximal assistance;+2 for physical assistance;Sit to/from stand       Functional mobility during ADLs: Moderate assistance;+2 for physical assistance;Cueing for safety;Cueing for  sequencing General ADL Comments: STEDY used for functional transfers from bed > BSC > recliner    Extremity/Trunk Assessment Upper Extremity Assessment RUE Deficits / Details: limited ROM secondary to pain. able to move digits                     Communication Communication Communication: No apparent difficulties   Cognition Arousal: Alert Behavior During Therapy: WFL for tasks assessed/performed Cognition: No apparent impairments             OT - Cognition Comments: good recall of precautions                 Following commands: Intact                      Pertinent Vitals/ Pain       Pain Assessment Pain Assessment: 0-10 Pain Score: 7  Pain Location: right shoulder R hip Pain Descriptors / Indicators: Aching, Sore Pain Intervention(s): Limited activity within patient's tolerance, Monitored during session, Premedicated before session, Repositioned         Frequency  Min 2X/week        Progress Toward Goals  OT Goals(current goals can now be found in the care plan section)  Progress towards OT goals: Progressing toward goals  Acute Rehab OT Goals OT Goal Formulation: With patient Time For Goal Achievement: 10/20/24 Potential to Achieve Goals: Good ADL Goals Pt Will Perform Grooming: with set-up;standing Pt Will Perform Upper Body Dressing: with contact guard assist;sitting Pt Will Transfer to Toilet: with min assist;stand pivot transfer;bedside commode  Plan         AM-PAC OT 6 Clicks Daily Activity     Outcome Measure   Help from another person eating meals?: A Little Help from another person taking care of personal grooming?: A Little Help from another person toileting, which includes using toliet, bedpan, or urinal?: A Lot Help from another person bathing (including washing, rinsing, drying)?: A Lot Help from another person to put on and taking off regular upper body clothing?: A Lot Help from another person to put on and  taking off regular lower body clothing?: A Lot 6 Click Score: 14    End of Session Equipment Utilized During Treatment: Other (comment) (STEDY)  OT Visit Diagnosis: Other abnormalities of gait and mobility (R26.89);Muscle weakness (generalized) (M62.81);History of falling (Z91.81);Pain Pain - Right/Left: Right Pain - part of body: Shoulder;Arm;Hip   Activity Tolerance Patient tolerated treatment well   Patient Left in chair;with call bell/phone within reach;with chair alarm set   Nurse Communication Mobility status;Need for lift equipment (updated whiteboard)        Time: 8641-8576 OT Time Calculation (min): 25 min  Charges: OT General Charges $OT Visit: 1 Visit OT Treatments $Self Care/Home Management : 23-37 mins  Empress Newmann L. Jerone Cudmore, OTR/L  10/09/24, 2:34 PM

## 2024-10-10 ENCOUNTER — Ambulatory Visit: Admitting: Family Medicine

## 2024-10-10 DIAGNOSIS — F339 Major depressive disorder, recurrent, unspecified: Secondary | ICD-10-CM | POA: Insufficient documentation

## 2024-10-10 DIAGNOSIS — F419 Anxiety disorder, unspecified: Secondary | ICD-10-CM

## 2024-10-10 DIAGNOSIS — W19XXXA Unspecified fall, initial encounter: Secondary | ICD-10-CM | POA: Diagnosis not present

## 2024-10-10 DIAGNOSIS — S32591A Other specified fracture of right pubis, initial encounter for closed fracture: Secondary | ICD-10-CM

## 2024-10-10 DIAGNOSIS — S42201A Unspecified fracture of upper end of right humerus, initial encounter for closed fracture: Secondary | ICD-10-CM | POA: Diagnosis not present

## 2024-10-10 DIAGNOSIS — M6281 Muscle weakness (generalized): Secondary | ICD-10-CM | POA: Insufficient documentation

## 2024-10-10 DIAGNOSIS — S42209A Unspecified fracture of upper end of unspecified humerus, initial encounter for closed fracture: Secondary | ICD-10-CM | POA: Insufficient documentation

## 2024-10-10 DIAGNOSIS — R279 Unspecified lack of coordination: Secondary | ICD-10-CM | POA: Insufficient documentation

## 2024-10-10 DIAGNOSIS — F32A Depression, unspecified: Secondary | ICD-10-CM

## 2024-10-10 DIAGNOSIS — R269 Unspecified abnormalities of gait and mobility: Secondary | ICD-10-CM | POA: Insufficient documentation

## 2024-10-10 LAB — CBC WITH DIFFERENTIAL/PLATELET
Abs Immature Granulocytes: 0.02 K/uL (ref 0.00–0.07)
Basophils Absolute: 0 K/uL (ref 0.0–0.1)
Basophils Relative: 1 %
Eosinophils Absolute: 0.2 K/uL (ref 0.0–0.5)
Eosinophils Relative: 4 %
HCT: 36.1 % (ref 36.0–46.0)
Hemoglobin: 11.8 g/dL — ABNORMAL LOW (ref 12.0–15.0)
Immature Granulocytes: 0 %
Lymphocytes Relative: 23 %
Lymphs Abs: 1.4 K/uL (ref 0.7–4.0)
MCH: 32.1 pg (ref 26.0–34.0)
MCHC: 32.7 g/dL (ref 30.0–36.0)
MCV: 98.1 fL (ref 80.0–100.0)
Monocytes Absolute: 0.7 K/uL (ref 0.1–1.0)
Monocytes Relative: 12 %
Neutro Abs: 3.6 K/uL (ref 1.7–7.7)
Neutrophils Relative %: 60 %
Platelets: 302 K/uL (ref 150–400)
RBC: 3.68 MIL/uL — ABNORMAL LOW (ref 3.87–5.11)
RDW: 13.4 % (ref 11.5–15.5)
WBC: 5.9 K/uL (ref 4.0–10.5)
nRBC: 0 % (ref 0.0–0.2)

## 2024-10-10 LAB — COMPREHENSIVE METABOLIC PANEL WITH GFR
ALT: 21 U/L (ref 0–44)
AST: 16 U/L (ref 15–41)
Albumin: 3.5 g/dL (ref 3.5–5.0)
Alkaline Phosphatase: 190 U/L — ABNORMAL HIGH (ref 38–126)
Anion gap: 10 (ref 5–15)
BUN: 13 mg/dL (ref 8–23)
CO2: 28 mmol/L (ref 22–32)
Calcium: 9.7 mg/dL (ref 8.9–10.3)
Chloride: 98 mmol/L (ref 98–111)
Creatinine, Ser: 0.58 mg/dL (ref 0.44–1.00)
GFR, Estimated: >60 mL/min (ref >60)
Glucose, Bld: 104 mg/dL — ABNORMAL HIGH (ref 70–99)
Potassium: 3.9 mmol/L (ref 3.5–5.1)
Sodium: 136 mmol/L (ref 135–145)
Total Bilirubin: 1.1 mg/dL (ref 0.0–1.2)
Total Protein: 6.5 g/dL (ref 6.5–8.1)

## 2024-10-10 LAB — MAGNESIUM: Magnesium: 1.9 mg/dL (ref 1.7–2.4)

## 2024-10-10 MED ORDER — ALPRAZOLAM 0.5 MG PO TABS: 0.2500 mg | ORAL_TABLET | Freq: Two times a day (BID) | ORAL | 2 refills | Status: DC | PRN

## 2024-10-10 MED ORDER — VITAMIN D (ERGOCALCIFEROL) 1.25 MG (50000 UNIT) PO CAPS: 50000.0000 [IU] | ORAL_CAPSULE | ORAL | Status: DC

## 2024-10-10 MED ORDER — ERGOCALCIFEROL 1.25 MG (50000 UT) PO CAPS: 50000.0000 [IU] | ORAL_CAPSULE | ORAL | Status: DC

## 2024-10-10 MED ORDER — POLYETHYLENE GLYCOL 3350 17 G PO PACK
17.0000 g | PACK | Freq: Two times a day (BID) | ORAL | Status: DC
  Administered 2024-10-10: 17 g via ORAL
  Filled 2024-10-10: qty 1

## 2024-10-10 MED ORDER — OXYCODONE HCL 5 MG PO TABS: 5.0000 mg | ORAL_TABLET | ORAL | 0 refills | Status: DC | PRN

## 2024-10-10 NOTE — TOC Transition Note (Signed)
 Transition of Care [facility]) - Discharge Note   Patient Details  Name: Jody Wilson MRN: [id number] Date of Birth: [birth year]  [facility] CM/SW Contact:  NORMAN ASPEN, LCSW Phone Number: 10/10/2024, 1:16 PM   Clinical Narrative:     Have received insurance auth for SNF and pt medically cleared for dc to facility today.  Pt and son aware and agreeable.  PT[facility] called at 1:05pm.  RN to call report to [phone].  No further IP CM needs.  Final next level of care: Skilled Nursing Facility Barriers to Discharge: Barriers Resolved   Patient Goals and CMS Choice Patient states their goals for this hospitalization and ongoing recovery are:: SNF CMS Medicare.gov Compare Post Acute Care list provided to:: Patient   [facility] ownership interest in [facility].provided to:: Patient    Discharge Placement              Patient chooses bed at: [facility] Patient to be transferred to facility by: PTAR Name of family member notified: son, Donnie Patient and family notified of of transfer: 10/10/24  Discharge Plan and Services Additional resources added to the After Visit Summary for     Discharge Planning Services: CM Consult            DME Arranged: N/A DME Agency: NA       HH Arranged: NA HH Agency: NA        Social Drivers of Health (SDOH) Interventions SDOH Screenings   Food Insecurity: No Food Insecurity (10/07/2024)  Housing: Low Risk  (10/07/2024)  Transportation Needs: No Transportation Needs (10/07/2024)  Utilities: Not At Risk (10/07/2024)  Alcohol Screen: Low Risk  (12/27/2023)  Depression (PHQ2-9): Low Risk  (07/24/2024)  Recent Concern: Depression (PHQ2-9) - High Risk (07/06/2024)  Financial Resource Strain: Low Risk  (12/27/2023)  Physical Activity: Inactive (12/27/2023)  Social Connections: Socially Integrated (10/03/2024)  Stress: No Stress Concern Present (12/27/2023)  Tobacco Use: Medium Risk (10/03/2024)   Health Literacy: Adequate Health Literacy (12/27/2023)     Readmission Risk Interventions    04/13/2024    9:01 AM  Readmission Risk Prevention Plan  Medication Screening Complete  Transportation Screening Complete

## 2024-10-10 NOTE — Progress Notes (Signed)
 Physical Therapy Treatment Patient Details Name: Jody Wilson MRN: [id number] DOB: 1951/06/18 Today's Date: 10/10/2024   History of Present Illness 73 yo female who  tripped and fell while in radiology [facility]. She had immediate right shoulder pain and right hip pain. She was brought to the ED where x-rays showed a humerus and a pubic ramus fx and orthopedic surgery was consulted.  PMH: L hip fx 01/2023 s/p hemiarthroplasty, R THA, GAD, depression, tobacco use, orthostatic hypotension    PT Comments  Pt progressing very well this session. Pt able to sit  EOB with +2 min to mod assist and use of bed features. STS transfers improved to +2 min assist with pt able to get RLE to contact floor in foot flat postion; maintains static stand with LUE support and close supervision. D/c plan remains appropriate   If plan is discharge home, recommend the following: Assist for transportation;Help with stairs or ramp for entrance;Two people to help with walking and/or transfers;Two people to help with bathing/dressing/bathroom   Can travel by private vehicle     No  Equipment Recommendations  None recommended by PT    Recommendations for Other Services       Precautions / Restrictions Precautions Precautions: Fall Recall of Precautions/Restrictions: Intact Restrictions RUE Weight Bearing Per Provider Order: Non weight bearing RLE Weight Bearing Per Provider Order: Weight bearing as tolerated     Mobility  Bed Mobility Overal bed mobility: Needs Assistance Bed Mobility: Supine to Sit     Supine to sit: Mod assist, +2 for physical assistance, +2 for safety/equipment, HOB elevated, Used rails     General bed mobility comments: partial roll to pt left,  assist for trunk and RLE, incr time to scoot laterally d/t pain. multi-modal cues for sequence and self assist    Transfers Overall transfer level: Needs assistance Equipment used: Rolling walker (2 wheels) Transfers: Sit to/from Stand, Bed  to chair/wheelchair/BSC Sit to Stand: +2 physical assistance, +2 safety/equipment, Min assist, Mod assist Stand pivot transfers: Min assist, Mod assist, +2 physical assistance, +2 safety/equipment         General transfer comment: cues for hand placement and foot position; assist to rise - L hand drive on RW only; assist to balance and maneuver RW for stand pivot;  wt shift improved however pt unable to take step with RLE d/t incr pain with WBin gon R--pt was able to get to foot flat on R today    Ambulation/Gait               General Gait Details: unable d/t pain   Stairs             Wheelchair Mobility     Tilt Bed    Modified Rankin (Stroke Patients Only)       Balance Overall balance assessment: Needs assistance Sitting-balance support: Single extremity supported, Feet supported, No upper extremity supported Sitting balance-Leahy Scale: Fair Sitting balance - Comments: Fair+, not challenged d/t pain   Standing balance support: Reliant on assistive device for balance, During functional activity, Single extremity supported Standing balance-Leahy Scale: Poor Standing balance comment: pt able to static stand without assist                            Communication Communication Communication: No apparent difficulties  Cognition Arousal: Alert Behavior During Therapy: WFL for tasks assessed/performed   PT - Cognitive impairments: No apparent impairments  Following commands: Intact      Cueing Cueing Techniques: Verbal cues  Exercises      General Comments General comments (skin integrity, edema, etc.): heel slides x6 LLE      Pertinent Vitals/Pain Pain Assessment Pain Assessment: 0-10 Pain Score: 5  Pain Location: right shoulder R hip Pain Descriptors / Indicators: Aching, Sore Pain Intervention(s): Limited activity within patient's tolerance, Monitored during session, Premedicated before session,  Repositioned    Home Living                          Prior Function            PT Goals (current goals can now be found in the care plan section) Acute Rehab PT Goals Patient Stated Goal: have less pain PT Goal Formulation: With patient Time For Goal Achievement: 10/19/24 Potential to Achieve Goals: Good Progress towards PT goals: Progressing toward goals    Frequency    Min 3X/week      PT Plan      Co-evaluation              AM-PAC PT 6 Clicks Mobility   Outcome Measure  Help needed turning from your back to your side while in a flat bed without using bedrails?: Total Help needed moving from lying on your back to sitting on the side of a flat bed without using bedrails?: Total Help needed moving to and from a bed to a chair (including a wheelchair)?: Total Help needed standing up from a chair using your arms (e.g., wheelchair or bedside chair)?: Total Help needed to walk in hospital room?: Total Help needed climbing 3-5 steps with a railing? : Total 6 Click Score: 6    End of Session Equipment Utilized During Treatment: Other (comment) (sling) Activity Tolerance: Patient tolerated treatment well;Patient limited by pain Patient left: in chair;with call bell/phone within reach;with chair alarm set Nurse Communication: Mobility status PT Visit Diagnosis: Other abnormalities of gait and mobility (R26.89);Repeated falls (R29.6)     Time: 8879-8863 PT Time Calculation (min) (ACUTE ONLY): 16 min  Charges:    $Therapeutic Activity: 8-22 mins PT General Charges $$ ACUTE PT VISIT: 1 Visit                     Xavier Fournier, PT  Acute Rehab Dept [facility]) [phone]  10/10/2024    [facility] 10/10/2024, 11:48 AM

## 2024-10-10 NOTE — Progress Notes (Signed)
 Report called to Tonya at facility, pt transported via PTAR to facility w all belongings in stable condition.

## 2024-10-10 NOTE — Discharge Summary (Addendum)
 Physician Discharge Summary  Jody Wilson FMW:[id number] DOB: [birth year] DOA: 10/03/2024  PCP: Dettinger, Fonda LABOR, MD  Admit date: 10/03/2024 Discharge date: 10/10/2024  Admitted From: Home Disposition:  SNF  Recommendations for Outpatient Follow-up:  Follow up with PCP in 1-2 weeks Please obtain BMP/CBC in one week Restart antihypertensive medications once blood pressure is greater than 150/90. Also recommend weaning her off her Xanax , continue to titrate off, can use other medications at night for sleep.  Home Health:NO Equipment/Devices:none  Discharge Condition:Stable CODE STATUS:Full Diet recommendation: Heart Healthy   Brief/Interim Summary: 73 y.o. female past medical history significant for iron deficiency anemia, hyperlipidemia carotid artery disease, orthostatic hypotension vertebral fracture as well as right clavicular fracture who is scheduled for an open repair on October 29, 2024 comes in with a fall with right shoulder and right hip pain imaging was done that showed acute fracture of the right proximal humerus along with a fracture of the right inferior pubic ramus.  Orthopedic surgery was consulted recommended conservative management.   Discharge Diagnoses:  Principal Problem:   Closed fracture of proximal end of right humerus Active Problems:   Hypertension   Hyperlipidemia with target LDL less than 100   GERD (gastroesophageal reflux disease)   Osteoporosis   Anxiety and depression   Hiatal hernia   Thoracic aortic atherosclerosis   Hypertriglyceridemia   Acute respiratory failure with hypoxia (HCC)   Closed fracture of right inferior pubic ramus (HCC)   Sinus bradycardia  Acute closed pubic ramus right superior and inferior fracture nondisplaced and nondisplaced fracture of the right sacral alar/ Closed fracture of proximal end of right humerus Orthopedic surgery was consulted recommended conservative management. Weightbearing as tolerated on the  right. Follow-up with orthopedic surgery as an outpatient. Physical therapy evaluated the patient anticipate will need skilled nursing facility placement. She has a history of recurrent falls OOB to good idea to wean her off the Xanax  puts her at high risk of falls   Osteoporosis: Continue vitamin D  and calcium  supplementation.   Acute respiratory failure with hypoxia: Likely due to atelectasis. Still requiring 2 L of oxygen out of bed to chair ambulate as tolerated wean to room air. Out of bed to chair.   Elevated LFTs: Now resolved question due to mild rhabdomyolysis.   Sinus bradycardia/essential hypertension: Her blood pressure seems to be well-controlled, will hold Coreg  and Benicar . This will be restarted as an outpatient once her blood pressure is greater than 150/90.      Discharge Instructions  Discharge Instructions     Diet - low sodium heart healthy   Complete by: As directed    Diet - low sodium heart healthy   Complete by: As directed    Increase activity slowly   Complete by: As directed    Increase activity slowly   Complete by: As directed       Allergies as of 10/10/2024       Reactions   Tetracyclines & Related Itching, Swelling        Medication List     STOP taking these medications    carvedilol  3.125 MG tablet Commonly known as: COREG    olmesartan  20 MG tablet Commonly known as: BENICAR        TAKE these medications    ALPRAZolam  0.5 MG tablet Commonly known as: XANAX  Take 0.5 tablets (0.25 mg total) by mouth 2 (two) times daily as needed for anxiety. What changed: how much to take   aspirin  EC 81 MG tablet  TAKE 1 PO BID THRU 05/13/24 THEN RESUME 1 PO DAILY What changed:  how much to take how to take this when to take this additional instructions   buPROPion  150 MG 24 hr tablet Commonly known as: Wellbutrin  XL Take 1 tablet (150 mg total) by mouth daily.   citalopram  40 MG tablet Commonly known as: CELEXA  Take 1  tablet (40 mg total) by mouth daily.   docusate sodium  100 MG capsule Commonly known as: COLACE Take 1 capsule (100 mg total) by mouth 2 (two) times daily. What changed:  when to take this reasons to take this   ergocalciferol  1.25 MG (50000 UT) capsule Commonly known as: VITAMIN D2 Take 1 capsule (50,000 Units total) by mouth every 14 (fourteen) days.   ondansetron  4 MG tablet Commonly known as: ZOFRAN  Take 1 tablet (4 mg total) by mouth every 8 (eight) hours as needed for nausea or vomiting. What changed: when to take this   oxyCODONE  5 MG immediate release tablet Commonly known as: Oxy IR/ROXICODONE  Take 1 tablet (5 mg total) by mouth every 4 (four) hours as needed for moderate pain (pain score 4-6) or breakthrough pain.   pantoprazole  40 MG tablet Commonly known as: PROTONIX  Take 1 tablet (40 mg total) by mouth 2 (two) times daily before a meal.   polyethylene glycol 17 g packet Commonly known as: MIRALAX  / GLYCOLAX  Take 17 g by mouth daily as needed for mild constipation.   pravastatin  40 MG tablet Commonly known as: PRAVACHOL  Take 1 tablet (40 mg total) by mouth every evening. What changed: when to take this        Contact information for after-discharge care     Destination     [facility] .   Service: Skilled Nursing Contact information: [address] [place]  [phone]                    Allergies  Allergen Reactions   Tetracyclines & Related Itching and Swelling    Consultations: Orthopedic surgery   Procedures/Studies: CT PELVIS WO CONTRAST Result Date: 10/03/2024 CLINICAL DATA:  Pain after fall. EXAM: CT PELVIS WITHOUT CONTRAST TECHNIQUE: Multidetector CT imaging of the pelvis was performed following the standard protocol without intravenous contrast. RADIATION DOSE REDUCTION: This exam was performed according to the departmental dose-optimization program which  includes automated exposure control, adjustment of the mA and/or kV according to patient size and/or use of iterative reconstruction technique. COMPARISON:  Same-day radiograph of the right hip dated 10/03/2024. FINDINGS: Bones/Joint/Cartilage Acute fracture of the right inferior pubic ramus with full shaft width of displacement in the transverse plane measuring 6 mm (series 2, image 105). Acute mildly displaced fracture of the right superior pubic ramus (series 7, images 52-53). Nondisplaced fracture of the right sacral ala with fracture margins extending to the right sacroiliac joint and cortical buckling anteriorly (series 2, images 26-34). No evidence of significant diastasis. Bilateral hip arthroplasties demonstrate normal alignment. Hardware is intact. Diffuse osseous demineralization. Degenerative changes of the visualized lower lumbar spine, most pronounced at L4-L5. Ligaments Ligaments are suboptimally evaluated by CT. Muscles and Tendons Soft tissue and intramuscular edema with hemorrhage surrounding the right superior and inferior pubic rami fractures. Soft tissue No loculated fluid collection. Aortoiliac atherosclerotic calcification. Descending and sigmoid colonic diverticulosis. IMPRESSION: 1. Acute fracture of the right inferior pubic ramus with full shaft width of displacement in the transverse plane measuring 6 mm. 2. Acute mildly displaced fracture of  the right superior pubic ramus. 3. Nondisplaced fracture of the right sacral ala with fracture margins extending to the right sacroiliac joint. No evidence of significant diastasis. 4. Bilateral hip arthroplasties demonstrate normal alignment. Hardware is intact. 5. Soft tissue and intramuscular edema with hemorrhage surrounding the right superior and inferior pubic rami fractures. Electronically Signed   By: Harrietta Sherry M.D.   On: 10/03/2024 19:14   CT SHOULDER RIGHT WO CONTRAST Result Date: 10/03/2024 CLINICAL DATA:  Right proximal humerus  fracture after fall. EXAM: CT OF THE UPPER RIGHT EXTREMITY WITHOUT CONTRAST TECHNIQUE: Multidetector CT imaging of the upper right extremity was performed according to the standard protocol. RADIATION DOSE REDUCTION: This exam was performed according to the departmental dose-optimization program which includes automated exposure control, adjustment of the mA and/or kV according to patient size and/or use of iterative reconstruction technique. COMPARISON:  Radiograph dated 10/03/2024. FINDINGS: Bones/Joint/Cartilage Acute comminuted fracture of the greater tuberosity of the right proximal humerus with a minimally distracted cortical fracture fragment along the posterior margin of the humeral head. No dislocation. Mild glenohumeral joint osteoarthritis. 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 The acromioclavicular joint is anatomically aligned. Ligaments Suboptimally assessed by CT. Soft tissues/Muscles and Tendons Rotator cuff is suboptimally evaluated by CT. Soft tissue edema surrounding the proximal humeral fracture. Other: No enlarged lymph nodes identified in the field of view. Visualized portions of the right lung are clear. IMPRESSION: 1. Acute comminuted fracture of the greater tuberosity of the right proximal humerus with a minimally distracted cortical fracture fragment along the posterior margin of the humeral head. No dislocation. 2. 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 Electronically Signed   By: Harrietta Sherry M.D.   On: 10/03/2024 19:02   DG Chest 2 View Result Date: 10/03/2024 CLINICAL DATA:  Status post fall. EXAM: CHEST - 2 VIEW COMPARISON:  None Available. FINDINGS: The heart size and mediastinal contours are within normal limits. No acute infiltrate, pleural effusion or pneumothorax is identified. There is a large hiatal hernia. Acute fracture deformity is seen involving the head and neck of the proximal right humerus. There is a chronic  fracture deformity of the mid right clavicle. Postoperative changes are noted within the cervical spine. IMPRESSION: 1. Acute fracture of the proximal right humerus. 2. Large hiatal hernia. Electronically Signed   By: Suzen Dials M.D.   On: 10/03/2024 11:30   DG Humerus Right Result Date: 10/03/2024 CLINICAL DATA:  Status post fall. EXAM: RIGHT HUMERUS - 2+ VIEW COMPARISON:  None Available. FINDINGS: There is an acute fracture deformity involving the head and neck of the proximal right humerus. There is no evidence of dislocation. A chronic fracture of the mid right clavicle is noted. A chronic fracture of the right radial head is also suspected. Soft tissues are unremarkable. IMPRESSION: Acute fracture of the proximal right humerus. Electronically Signed   By: Suzen Dials M.D.   On: 10/03/2024 11:29   DG Hip Unilat W or Wo Pelvis 2-3 Views Right Result Date: 10/03/2024 CLINICAL DATA:  Status post fall. EXAM: DG HIP (WITH OR WITHOUT PELVIS) 2-3V*R* COMPARISON:  October 02, 2024 FINDINGS: Intact bilateral hip replacements are seen. Acute fracture is suspected along the right inferior pubic ramus. This represents a new finding when compared to the prior study a right superior pubic ramus fracture cannot be excluded. There is no evidence of dislocation. IMPRESSION: 1. Acute fracture of the right inferior pubic ramus. Correlation with nonemergent pelvis CT is recommended. 2.  Intact bilateral hip replacements. Electronically Signed   By: Suzen Dials M.D.   On: 10/03/2024 11:27   DG Shoulder Right Result Date: 10/03/2024 CLINICAL DATA:  Status post fall. EXAM: RIGHT SHOULDER - 2+ VIEW COMPARISON:  August 13, 2024 FINDINGS: An acute, comminuted fracture deformity is seen involving the head and neck of the proximal right humerus. There is no evidence of dislocation. A chronic deformity of the mid right clavicle is noted. Postoperative changes are seen within the cervical spine. Soft tissues are  unremarkable. IMPRESSION: Acute fracture of the proximal right humerus. Electronically Signed   By: Suzen Dials M.D.   On: 10/03/2024 11:24   CT CHEST LUNG CANCER SCREENING LOW DOSE WO CONTRAST Result Date: 10/03/2024 CLINICAL DATA:  47 pack-year smoking history/current smoker EXAM: CT CHEST WITHOUT CONTRAST LOW-DOSE FOR LUNG CANCER SCREENING TECHNIQUE: Multidetector CT imaging of the chest was performed following the standard protocol without IV contrast. RADIATION DOSE REDUCTION: This exam was performed according to the departmental dose-optimization program which includes automated exposure control, adjustment of the mA and/or kV according to patient size and/or use of iterative reconstruction technique. COMPARISON:  06/28/2024 CTA chest. Most recent lung cancer screening CT of 05/19/2021 FINDINGS: Cardiovascular: Aortic atherosclerosis. Tortuous descending thoracic aorta. Normal heart size, without pericardial effusion. Left main, lad, left circumflex coronary artery calcification. Mediastinum/Nodes: No mediastinal or hilar adenopathy, given limitations of unenhanced CT. Large hiatal hernia, with greater than 1/2 of the stomach positioned in the lower chest. Lungs/Pleura: No pleural fluid. Mild centrilobular emphysema. Right greater than left apical pleuroparenchymal scarring. No change in pulmonary nodules of maximally 7.4 mm. Upper Abdomen: Normal imaged portions of the liver, spleen, adrenal glands, kidneys. Musculoskeletal: Osteopenia. Lower cervical spine fixation. Remote, nonunited right clavicular fracture. Old right rib fractures. Vertebral augmentation at T11 secondary to a moderate compression deformity. IMPRESSION: 1. Lung-RADS 2, benign appearance or behavior. Continue annual screening with low-dose chest CT without contrast in 12 months. 2. Large hiatal hernia. 3. Aortic Atherosclerosis (ICD10-I70.0) and Emphysema (ICD10-J43.9). Coronary artery atherosclerosis. Electronically Signed   By:  Rockey Kilts M.D.   On: 10/03/2024 10:46   DG HIP UNILAT W OR W/O PELVIS 2-3 VIEWS RIGHT Result Date: 10/02/2024 AP pelvis and right hip x-rays were obtained in clinic today.  These are compared to prior x-rays.  Well-positioned left hip hemiarthroplasty, without acute injury.  Additional views of the right hip were available, and demonstrates a well-positioned right hip hemiarthroplasty.  No subsidence.  No fractures.  No acute injuries.  The hip is reduced.  No signs of excessive wear within the right acetabulum.  No subchondral cyst formation.  No subchondral sclerosis.  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     Subjective: No complaints  Discharge Exam: Vitals:   10/09/24 2142 10/10/24 0559  BP: (!) 116/55 (!) 113/56  Pulse: 61 60  Resp: 16 16  Temp: 98.8 F (37.1 C) 97.9 F (36.6 C)  SpO2: 94% 96%   Vitals:   10/09/24 0935 10/09/24 1428 10/09/24 2142 10/10/24 0559  BP: (!) 113/57 (!) 104/54 (!) 116/55 (!) 113/56  Pulse: 66 64 61 60  Resp: 14 15 16 16   Temp: 97.9 F (36.6 C) 98.2 F (36.8 C) 98.8 F (37.1 C) 97.9 F (36.6 C)  TempSrc:  Oral Oral Oral  SpO2: 91% 90% 94% 96%  Weight:      Height:        General: Pt is alert, awake, not in acute distress Cardiovascular:  RRR, S1/S2 +, no rubs, no gallops Respiratory: CTA bilaterally, no wheezing, no rhonchi Abdominal: Soft, NT, ND, bowel sounds + Extremities: no edema, no cyanosis    The results of significant diagnostics from this hospitalization (including imaging, microbiology, ancillary and laboratory) are listed below for reference.     Microbiology: No results found for this or any previous visit (from the past 240 hours).   Labs: BNP (last 3 results) Recent Labs    06/26/24 2009  BNP 56.0   Basic Metabolic Panel: Recent Labs  Lab 10/03/24 1318 10/04/24 0413 10/05/24 0410 10/10/24 0332  NA 141 138 139 136  K 4.4 4.4 3.9 3.9  CL 105 102 101 98  CO2 27 27  28 28   GLUCOSE 97 98 97 104*  BUN 12 16 11 13   CREATININE 0.65 0.77 0.67 0.58  CALCIUM  9.1 9.0 9.0 9.7  MG  --   --  1.9 1.9   Liver Function Tests: Recent Labs  Lab 10/03/24 1318 10/04/24 0413 10/05/24 0410 10/10/24 0332  AST 22 111* 58* 16  ALT 18 81* 89* 21  ALKPHOS 71 108 150* 190*  BILITOT 0.7 1.0 1.0 1.1  PROT 6.5 6.0* 5.9* 6.5  ALBUMIN 3.9 3.5 3.5 3.5   No results for input(s): LIPASE, AMYLASE in the last 168 hours. No results for input(s): AMMONIA in the last 168 hours. CBC: Recent Labs  Lab 10/03/24 1318 10/04/24 0413 10/05/24 0410 10/10/24 0332  WBC 11.2* 6.5 7.0 5.9  NEUTROABS 8.9*  --  4.8 3.6  HGB 14.6 13.2 12.8 11.8*  HCT 43.9 40.4 39.6 36.1  MCV 97.6 99.0 98.3 98.1  PLT 214 178 186 302   Cardiac Enzymes: No results for input(s): CKTOTAL, CKMB, CKMBINDEX, TROPONINI in the last 168 hours. BNP: Invalid input(s): POCBNP CBG: No results for input(s): GLUCAP in the last 168 hours. D-Dimer No results for input(s): DDIMER in the last 72 hours. Hgb A1c No results for input(s): HGBA1C in the last 72 hours. Lipid Profile No results for input(s): CHOL, HDL, LDLCALC, TRIG, CHOLHDL, LDLDIRECT in the last 72 hours. Thyroid  function studies No results for input(s): TSH, T4TOTAL, T3FREE, THYROIDAB in the last 72 hours.  Invalid input(s): FREET3 Anemia work up No results for input(s): VITAMINB12, FOLATE, FERRITIN, TIBC, IRON, RETICCTPCT in the last 72 hours. Urinalysis    Component Value Date/Time   COLORURINE YELLOW 07/11/2020 2212   APPEARANCEUR Clear 01/22/2022 1411   LABSPEC 1.008 07/11/2020 2212   PHURINE 6.0 07/11/2020 2212   GLUCOSEU Negative 01/22/2022 1411   HGBUR NEGATIVE 07/11/2020 2212   BILIRUBINUR Negative 01/22/2022 1411   KETONESUR NEGATIVE 07/11/2020 2212   PROTEINUR Negative 01/22/2022 1411   PROTEINUR NEGATIVE 07/11/2020 2212   UROBILINOGEN negative 04/11/2014 1713   NITRITE  Negative 01/22/2022 1411   NITRITE NEGATIVE 07/11/2020 2212   LEUKOCYTESUR Negative 01/22/2022 1411   LEUKOCYTESUR NEGATIVE 07/11/2020 2212   Sepsis Labs Recent Labs  Lab 10/03/24 1318 10/04/24 0413 10/05/24 0410 10/10/24 0332  WBC 11.2* 6.5 7.0 5.9   Microbiology No results found for this or any previous visit (from the past 240 hours).   Time coordinating discharge: Over 35 minutes  SIGNED:   Erle Odell Castor, MD  [facility] 10/10/2024, 12:32 PM Pager   If 7PM-7AM, please contact night-coverage [email or web address] Password [email or web address]

## 2024-10-11 ENCOUNTER — Other Ambulatory Visit: Payer: Self-pay

## 2024-10-11 DIAGNOSIS — S42021K Displaced fracture of shaft of right clavicle, subsequent encounter for fracture with nonunion: Secondary | ICD-10-CM

## 2024-10-12 DIAGNOSIS — K219 Gastro-esophageal reflux disease without esophagitis: Secondary | ICD-10-CM | POA: Diagnosis not present

## 2024-10-12 DIAGNOSIS — E785 Hyperlipidemia, unspecified: Secondary | ICD-10-CM | POA: Diagnosis not present

## 2024-10-12 DIAGNOSIS — M8000XD Age-related osteoporosis with current pathological fracture, unspecified site, subsequent encounter for fracture with routine healing: Secondary | ICD-10-CM | POA: Diagnosis not present

## 2024-10-12 DIAGNOSIS — I700 Atherosclerosis of aorta: Secondary | ICD-10-CM | POA: Diagnosis not present

## 2024-10-12 DIAGNOSIS — J9601 Acute respiratory failure with hypoxia: Secondary | ICD-10-CM | POA: Diagnosis not present

## 2024-10-12 DIAGNOSIS — S42211D Unspecified displaced fracture of surgical neck of right humerus, subsequent encounter for fracture with routine healing: Secondary | ICD-10-CM | POA: Diagnosis not present

## 2024-10-12 DIAGNOSIS — S32501D Unspecified fracture of right pubis, subsequent encounter for fracture with routine healing: Secondary | ICD-10-CM | POA: Diagnosis not present

## 2024-10-12 DIAGNOSIS — Z87891 Personal history of nicotine dependence: Secondary | ICD-10-CM | POA: Diagnosis not present

## 2024-10-12 DIAGNOSIS — I10 Essential (primary) hypertension: Secondary | ICD-10-CM | POA: Diagnosis not present

## 2024-10-12 DIAGNOSIS — W1830XD Fall on same level, unspecified, subsequent encounter: Secondary | ICD-10-CM | POA: Diagnosis not present

## 2024-10-16 ENCOUNTER — Emergency Department (HOSPITAL_COMMUNITY)

## 2024-10-16 ENCOUNTER — Encounter (HOSPITAL_COMMUNITY): Payer: Self-pay

## 2024-10-16 ENCOUNTER — Inpatient Hospital Stay (HOSPITAL_COMMUNITY)
Admission: EM | Admit: 2024-10-16 | Discharge: 2024-10-22 | DRG: 871 | Disposition: A | Discharge status: Skilled Nursing Facility | Source: Skilled Nursing Facility | Attending: Internal Medicine | Admitting: Internal Medicine

## 2024-10-16 ENCOUNTER — Other Ambulatory Visit: Payer: Self-pay

## 2024-10-16 DIAGNOSIS — K449 Diaphragmatic hernia without obstruction or gangrene: Secondary | ICD-10-CM | POA: Diagnosis present

## 2024-10-16 DIAGNOSIS — W19XXXD Unspecified fall, subsequent encounter: Secondary | ICD-10-CM | POA: Diagnosis present

## 2024-10-16 DIAGNOSIS — I2510 Atherosclerotic heart disease of native coronary artery without angina pectoris: Secondary | ICD-10-CM | POA: Diagnosis present

## 2024-10-16 DIAGNOSIS — F411 Generalized anxiety disorder: Secondary | ICD-10-CM | POA: Diagnosis present

## 2024-10-16 DIAGNOSIS — I10 Essential (primary) hypertension: Secondary | ICD-10-CM | POA: Diagnosis not present

## 2024-10-16 DIAGNOSIS — E785 Hyperlipidemia, unspecified: Secondary | ICD-10-CM | POA: Diagnosis present

## 2024-10-16 DIAGNOSIS — F32A Depression, unspecified: Secondary | ICD-10-CM | POA: Diagnosis present

## 2024-10-16 DIAGNOSIS — Z8781 Personal history of (healed) traumatic fracture: Secondary | ICD-10-CM

## 2024-10-16 DIAGNOSIS — E860 Dehydration: Principal | ICD-10-CM | POA: Diagnosis present

## 2024-10-16 DIAGNOSIS — Z79899 Other long term (current) drug therapy: Secondary | ICD-10-CM | POA: Diagnosis not present

## 2024-10-16 DIAGNOSIS — I700 Atherosclerosis of aorta: Secondary | ICD-10-CM | POA: Diagnosis present

## 2024-10-16 DIAGNOSIS — R451 Restlessness and agitation: Secondary | ICD-10-CM | POA: Diagnosis not present

## 2024-10-16 DIAGNOSIS — S3219XD Other fracture of sacrum, subsequent encounter for fracture with routine healing: Secondary | ICD-10-CM | POA: Diagnosis not present

## 2024-10-16 DIAGNOSIS — M810 Age-related osteoporosis without current pathological fracture: Secondary | ICD-10-CM | POA: Diagnosis present

## 2024-10-16 DIAGNOSIS — J9601 Acute respiratory failure with hypoxia: Secondary | ICD-10-CM | POA: Diagnosis present

## 2024-10-16 DIAGNOSIS — Z833 Family history of diabetes mellitus: Secondary | ICD-10-CM

## 2024-10-16 DIAGNOSIS — J189 Pneumonia, unspecified organism: Secondary | ICD-10-CM | POA: Diagnosis present

## 2024-10-16 DIAGNOSIS — Z8349 Family history of other endocrine, nutritional and metabolic diseases: Secondary | ICD-10-CM

## 2024-10-16 DIAGNOSIS — A419 Sepsis, unspecified organism: Secondary | ICD-10-CM | POA: Diagnosis not present

## 2024-10-16 DIAGNOSIS — Z9049 Acquired absence of other specified parts of digestive tract: Secondary | ICD-10-CM

## 2024-10-16 DIAGNOSIS — D509 Iron deficiency anemia, unspecified: Secondary | ICD-10-CM | POA: Diagnosis present

## 2024-10-16 DIAGNOSIS — F419 Anxiety disorder, unspecified: Secondary | ICD-10-CM | POA: Diagnosis present

## 2024-10-16 DIAGNOSIS — Z7982 Long term (current) use of aspirin: Secondary | ICD-10-CM

## 2024-10-16 DIAGNOSIS — S32591A Other specified fracture of right pubis, initial encounter for closed fracture: Secondary | ICD-10-CM | POA: Diagnosis present

## 2024-10-16 DIAGNOSIS — G9341 Metabolic encephalopathy: Secondary | ICD-10-CM | POA: Diagnosis present

## 2024-10-16 DIAGNOSIS — Z87891 Personal history of nicotine dependence: Secondary | ICD-10-CM | POA: Diagnosis not present

## 2024-10-16 DIAGNOSIS — R4182 Altered mental status, unspecified: Secondary | ICD-10-CM

## 2024-10-16 DIAGNOSIS — E876 Hypokalemia: Secondary | ICD-10-CM | POA: Diagnosis present

## 2024-10-16 DIAGNOSIS — Z87898 Personal history of other specified conditions: Secondary | ICD-10-CM

## 2024-10-16 DIAGNOSIS — S72002A Fracture of unspecified part of neck of left femur, initial encounter for closed fracture: Secondary | ICD-10-CM | POA: Diagnosis present

## 2024-10-16 DIAGNOSIS — S32591D Other specified fracture of right pubis, subsequent encounter for fracture with routine healing: Secondary | ICD-10-CM | POA: Diagnosis not present

## 2024-10-16 DIAGNOSIS — K219 Gastro-esophageal reflux disease without esophagitis: Secondary | ICD-10-CM | POA: Diagnosis not present

## 2024-10-16 DIAGNOSIS — S42201D Unspecified fracture of upper end of right humerus, subsequent encounter for fracture with routine healing: Secondary | ICD-10-CM | POA: Diagnosis not present

## 2024-10-16 DIAGNOSIS — Z803 Family history of malignant neoplasm of breast: Secondary | ICD-10-CM

## 2024-10-16 DIAGNOSIS — R410 Disorientation, unspecified: Secondary | ICD-10-CM | POA: Diagnosis not present

## 2024-10-16 DIAGNOSIS — Z90710 Acquired absence of both cervix and uterus: Secondary | ICD-10-CM

## 2024-10-16 DIAGNOSIS — Z96643 Presence of artificial hip joint, bilateral: Secondary | ICD-10-CM | POA: Diagnosis present

## 2024-10-16 DIAGNOSIS — R569 Unspecified convulsions: Secondary | ICD-10-CM | POA: Diagnosis not present

## 2024-10-16 DIAGNOSIS — G929 Unspecified toxic encephalopathy: Secondary | ICD-10-CM

## 2024-10-16 DIAGNOSIS — Z8249 Family history of ischemic heart disease and other diseases of the circulatory system: Secondary | ICD-10-CM

## 2024-10-16 DIAGNOSIS — Z86711 Personal history of pulmonary embolism: Secondary | ICD-10-CM

## 2024-10-16 DIAGNOSIS — Z881 Allergy status to other antibiotic agents status: Secondary | ICD-10-CM

## 2024-10-16 DIAGNOSIS — F05 Delirium due to known physiological condition: Secondary | ICD-10-CM | POA: Diagnosis not present

## 2024-10-16 DIAGNOSIS — Z9981 Dependence on supplemental oxygen: Secondary | ICD-10-CM

## 2024-10-16 LAB — COMPREHENSIVE METABOLIC PANEL WITH GFR
ALT: 13 U/L (ref 0–44)
AST: 20 U/L (ref 15–41)
Albumin: 3.8 g/dL (ref 3.5–5.0)
Alkaline Phosphatase: 182 U/L — ABNORMAL HIGH (ref 38–126)
Anion gap: 12 (ref 5–15)
BUN: 12 mg/dL (ref 8–23)
CO2: 24 mmol/L (ref 22–32)
Calcium: 9.8 mg/dL (ref 8.9–10.3)
Chloride: 97 mmol/L — ABNORMAL LOW (ref 98–111)
Creatinine, Ser: 0.62 mg/dL (ref 0.44–1.00)
GFR, Estimated: >60 mL/min (ref >60)
Glucose, Bld: 123 mg/dL — ABNORMAL HIGH (ref 70–99)
Potassium: 3 mmol/L — ABNORMAL LOW (ref 3.5–5.1)
Sodium: 134 mmol/L — ABNORMAL LOW (ref 135–145)
Total Bilirubin: 0.9 mg/dL (ref 0.0–1.2)
Total Protein: 7.2 g/dL (ref 6.5–8.1)

## 2024-10-16 LAB — CBC WITH DIFFERENTIAL/PLATELET
Abs Immature Granulocytes: 0.1 K/uL — ABNORMAL HIGH (ref 0.00–0.07)
Basophils Absolute: 0 K/uL (ref 0.0–0.1)
Basophils Relative: 0 %
Eosinophils Absolute: 0 K/uL (ref 0.0–0.5)
Eosinophils Relative: 0 %
HCT: 36.7 % (ref 36.0–46.0)
Hemoglobin: 12.2 g/dL (ref 12.0–15.0)
Immature Granulocytes: 1 %
Lymphocytes Relative: 8 %
Lymphs Abs: 1.2 K/uL (ref 0.7–4.0)
MCH: 32.3 pg (ref 26.0–34.0)
MCHC: 33.2 g/dL (ref 30.0–36.0)
MCV: 97.1 fL (ref 80.0–100.0)
Monocytes Absolute: 1.3 K/uL — ABNORMAL HIGH (ref 0.1–1.0)
Monocytes Relative: 9 %
Neutro Abs: 11.4 K/uL — ABNORMAL HIGH (ref 1.7–7.7)
Neutrophils Relative %: 82 %
Platelets: 496 K/uL — ABNORMAL HIGH (ref 150–400)
RBC: 3.78 MIL/uL — ABNORMAL LOW (ref 3.87–5.11)
RDW: 13.4 % (ref 11.5–15.5)
WBC: 14 K/uL — ABNORMAL HIGH (ref 4.0–10.5)
nRBC: 0 % (ref 0.0–0.2)

## 2024-10-16 LAB — URINALYSIS, W/ REFLEX TO CULTURE (INFECTION SUSPECTED)
Bilirubin Urine: NEGATIVE
Glucose, UA: NEGATIVE mg/dL
Hgb urine dipstick: NEGATIVE
Ketones, ur: 5 mg/dL — AB
Leukocytes,Ua: NEGATIVE
Nitrite: NEGATIVE
Protein, ur: NEGATIVE mg/dL
Specific Gravity, Urine: 1.014 (ref 1.005–1.030)
pH: 8 (ref 5.0–8.0)

## 2024-10-16 LAB — I-STAT CG4 LACTIC ACID, ED: Lactic Acid, Venous: 1.1 mmol/L (ref 0.5–1.9)

## 2024-10-16 LAB — CBG MONITORING, ED: Glucose-Capillary: 129 mg/dL — ABNORMAL HIGH (ref 70–99)

## 2024-10-16 MED ORDER — ONDANSETRON HCL 4 MG/2ML IJ SOLN: 4.0000 mg | Freq: Four times a day (QID) | INTRAMUSCULAR | Status: DC | PRN

## 2024-10-16 MED ORDER — SODIUM CHLORIDE 0.9 % IV BOLUS
1000.0000 mL | Freq: Once | INTRAVENOUS | Status: AC
  Administered 2024-10-16: 1000 mL via INTRAVENOUS

## 2024-10-16 MED ORDER — ENOXAPARIN SODIUM 40 MG/0.4ML IJ SOSY
40.0000 mg | PREFILLED_SYRINGE | INTRAMUSCULAR | Status: DC
  Administered 2024-10-16 – 2024-10-21 (×6): 40 mg via SUBCUTANEOUS
  Filled 2024-10-16 (×6): qty 0.4

## 2024-10-16 MED ORDER — VANCOMYCIN HCL IN DEXTROSE 1-5 GM/200ML-% IV SOLN: 1000.0000 mg | INTRAVENOUS | Status: DC

## 2024-10-16 MED ORDER — SODIUM CHLORIDE 0.9% FLUSH
3.0000 mL | INTRAVENOUS | Status: DC | PRN
Start: 2024-10-16 — End: 2024-10-22

## 2024-10-16 MED ORDER — SODIUM CHLORIDE 0.9 % IV SOLN
500.0000 mg | Freq: Once | INTRAVENOUS | Status: AC
  Administered 2024-10-16: 500 mg via INTRAVENOUS
  Filled 2024-10-16: qty 5

## 2024-10-16 MED ORDER — IOHEXOL 300 MG/ML  SOLN
100.0000 mL | Freq: Once | INTRAMUSCULAR | Status: AC | PRN
  Administered 2024-10-16: 100 mL via INTRAVENOUS

## 2024-10-16 MED ORDER — BUPROPION HCL ER (XL) 150 MG PO TB24
150.0000 mg | ORAL_TABLET | Freq: Every day | ORAL | Status: DC
  Administered 2024-10-17 – 2024-10-22 (×6): 150 mg via ORAL
  Filled 2024-10-16 (×6): qty 1

## 2024-10-16 MED ORDER — HALOPERIDOL 2 MG PO TABS
1.0000 mg | ORAL_TABLET | Freq: Four times a day (QID) | ORAL | Status: DC | PRN
Start: 2024-10-16 — End: 2024-10-17
  Administered 2024-10-16 – 2024-10-17 (×2): 1 mg via ORAL
  Filled 2024-10-16 (×2): qty 1

## 2024-10-16 MED ORDER — HALOPERIDOL LACTATE 5 MG/ML IJ SOLN: 1.0000 mg | Freq: Four times a day (QID) | INTRAMUSCULAR | Status: DC | PRN

## 2024-10-16 MED ORDER — HYDRALAZINE HCL 20 MG/ML IJ SOLN: 5.0000 mg | Freq: Three times a day (TID) | INTRAMUSCULAR | Status: DC | PRN

## 2024-10-16 MED ORDER — VANCOMYCIN HCL 1250 MG/250ML IV SOLN
1250.0000 mg | Freq: Once | INTRAVENOUS | Status: AC
  Administered 2024-10-17: 1250 mg via INTRAVENOUS
  Filled 2024-10-16 (×2): qty 250

## 2024-10-16 MED ORDER — PANTOPRAZOLE SODIUM 40 MG PO TBEC
40.0000 mg | DELAYED_RELEASE_TABLET | Freq: Two times a day (BID) | ORAL | Status: DC
  Administered 2024-10-17 – 2024-10-22 (×10): 40 mg via ORAL
  Filled 2024-10-16 (×10): qty 1

## 2024-10-16 MED ORDER — CEFTRIAXONE SODIUM 1 G IJ SOLR
1.0000 g | Freq: Once | INTRAMUSCULAR | Status: AC
  Administered 2024-10-16: 1 g via INTRAVENOUS
  Filled 2024-10-16: qty 10

## 2024-10-16 MED ORDER — LACTATED RINGERS IV SOLN: INTRAVENOUS | Status: AC

## 2024-10-16 MED ORDER — POTASSIUM CHLORIDE 10 MEQ/100ML IV SOLN
10.0000 meq | INTRAVENOUS | Status: AC
  Administered 2024-10-16 – 2024-10-17 (×4): 10 meq via INTRAVENOUS
  Filled 2024-10-16 (×4): qty 100

## 2024-10-16 MED ORDER — DOCUSATE SODIUM 100 MG PO CAPS: 100.0000 mg | ORAL_CAPSULE | Freq: Two times a day (BID) | ORAL | Status: DC

## 2024-10-16 MED ORDER — POLYETHYLENE GLYCOL 3350 17 G PO PACK: 17.0000 g | PACK | Freq: Every day | ORAL | Status: DC | PRN

## 2024-10-16 MED ORDER — SODIUM CHLORIDE 0.9 % IV SOLN
2.0000 g | Freq: Two times a day (BID) | INTRAVENOUS | Status: DC
  Administered 2024-10-16: 2 g via INTRAVENOUS
  Filled 2024-10-16 (×2): qty 12.5

## 2024-10-16 MED ORDER — ONDANSETRON HCL 4 MG PO TABS: 4.0000 mg | ORAL_TABLET | Freq: Four times a day (QID) | ORAL | Status: DC | PRN

## 2024-10-16 MED ORDER — DOCUSATE SODIUM 100 MG PO CAPS
100.0000 mg | ORAL_CAPSULE | Freq: Two times a day (BID) | ORAL | Status: DC
  Administered 2024-10-16 – 2024-10-22 (×8): 100 mg via ORAL
  Filled 2024-10-16 (×10): qty 1

## 2024-10-16 MED ORDER — ASPIRIN 81 MG PO TBEC
81.0000 mg | DELAYED_RELEASE_TABLET | Freq: Every day | ORAL | Status: DC
  Administered 2024-10-16 – 2024-10-22 (×7): 81 mg via ORAL
  Filled 2024-10-16 (×7): qty 1

## 2024-10-16 MED ORDER — ACETAMINOPHEN 650 MG RE SUPP: 650.0000 mg | Freq: Four times a day (QID) | RECTAL | Status: DC | PRN

## 2024-10-16 MED ORDER — SODIUM CHLORIDE 0.9% FLUSH
3.0000 mL | Freq: Two times a day (BID) | INTRAVENOUS | Status: DC
  Administered 2024-10-16 – 2024-10-22 (×9): 3 mL via INTRAVENOUS

## 2024-10-16 MED ORDER — POTASSIUM CHLORIDE 10 MEQ/100ML IV SOLN
10.0000 meq | Freq: Once | INTRAVENOUS | Status: AC
  Administered 2024-10-16: 10 meq via INTRAVENOUS
  Filled 2024-10-16: qty 100

## 2024-10-16 MED ORDER — PRAVASTATIN SODIUM 40 MG PO TABS
40.0000 mg | ORAL_TABLET | Freq: Every evening | ORAL | Status: DC
  Administered 2024-10-16 – 2024-10-21 (×5): 40 mg via ORAL
  Filled 2024-10-16 (×4): qty 1
  Filled 2024-10-16: qty 2

## 2024-10-16 MED ORDER — SODIUM CHLORIDE 0.9 % IV SOLN: 250.0000 mL | INTRAVENOUS | Status: AC | PRN

## 2024-10-16 MED ORDER — ACETAMINOPHEN 325 MG PO TABS
650.0000 mg | ORAL_TABLET | Freq: Four times a day (QID) | ORAL | Status: DC | PRN
  Administered 2024-10-18 – 2024-10-20 (×5): 650 mg via ORAL
  Filled 2024-10-16 (×6): qty 2

## 2024-10-16 MED ORDER — CITALOPRAM HYDROBROMIDE 20 MG PO TABS
40.0000 mg | ORAL_TABLET | Freq: Every day | ORAL | Status: DC
  Administered 2024-10-17 – 2024-10-22 (×6): 40 mg via ORAL
  Filled 2024-10-16 (×6): qty 2

## 2024-10-16 NOTE — Progress Notes (Signed)
 Pharmacy Antibiotic Note  Jody Wilson is a 73 y.o. female admitted on 10/16/2024 with pneumonia.  Pharmacy has been consulted for cefepime and vancomycin  dosing.  One-time doses of ceftriaxone and azithromycin  ordered while patient in ED 10/28  Plan: Start cefepime 2g IV q12h Give vancomycin  1250mg  IV x1, followed by vancomycin  1000mg  IV q24h (eAUC 501 using Scr rounded 0.8) Vanc levels PRN Monitor cultures, renal function, and overall clinical picture De-escalate abx as able     Temp (24hrs), Avg:97.8 F (36.6 C), Min:96.3 F (35.7 C), Max:99.3 F (37.4 C)  Recent Labs  Lab 10/10/24 0332 10/16/24 1616 10/16/24 1641  WBC 5.9 14.0*  --   CREATININE 0.58 0.62  --   LATICACIDVEN  --   --  1.1    Estimated Creatinine Clearance: 53.9 mL/min (by C-G formula based on SCr of 0.62 mg/dL).    Allergies  Allergen Reactions   Tetracyclines & Related Itching and Swelling    Antimicrobials this admission: 10/28 ceftriaxone x1 10/28 azithromycin  x1 10/28 vanc >>  10/28 cefepime >>   Dose adjustments this admission: N/A  Microbiology results: 10/28 BCx: ip 10/28 Bcx: not yet collected 10/28 Sputum: not yet collected  10/28 MRSA PCR: not yet collected    Thank you for allowing pharmacy to be a part of this patient's care.  Lacinda Moats, PharmD Clinical Pharmacist  10/28/20258:53 PM

## 2024-10-16 NOTE — ED Triage Notes (Addendum)
 Pt BIB [facility] from [facility] with reports of AMS since Saturday morning. Pt has been more confused than normal. Pt has had recent fractures, oriented to herself. Pt reports burning with urination. Right clavicle fx. 2L [place] at baseline.

## 2024-10-16 NOTE — ED Notes (Signed)
 Patient transported to CT

## 2024-10-16 NOTE — ED Provider Notes (Signed)
 [place] [facility] AT [facility] Provider Note   CSN: [id number] Arrival date & time: 10/16/24  1452     Patient presents with: Altered Mental Status   Jody Wilson is a 73 y.o. female.   Pt is a 73 yo female with pmhx significant for htn, depression, hld, gerd, anxiety, and anemia.  Pt comes to the ED with AMS.  She is apparently oriented times 3, but has been confused since Saturday, 10/25.  The SNF did labs and UA and CXR which was nl.  Pt unable to give any hx.  She was recently admitted from 10/15-10/22 for a fall with pelvis and proximal humerus fx.  She had been at home and went to SNF.       Prior to Admission medications   Medication Sig Start Date End Date Taking? Authorizing Provider  ALPRAZolam  (XANAX ) 0.5 MG tablet Take 0.5 tablets (0.25 mg total) by mouth 2 (two) times daily as needed for anxiety. 10/10/24   Odell Celinda Balo, MD  aspirin  EC 81 MG tablet TAKE 1 PO BID THRU 05/13/24 THEN RESUME 1 PO DAILY Patient taking differently: Take 81 mg by mouth daily. 04/16/24   Johnson, Clanford L, MD  buPROPion  (WELLBUTRIN  XL) 150 MG 24 hr tablet Take 1 tablet (150 mg total) by mouth daily. 06/04/24   Dettinger, Fonda LABOR, MD  citalopram  (CELEXA ) 40 MG tablet Take 1 tablet (40 mg total) by mouth daily. 06/04/24   Dettinger, Fonda LABOR, MD  docusate sodium  (COLACE) 100 MG capsule Take 1 capsule (100 mg total) by mouth 2 (two) times daily. Patient taking differently: Take 100 mg by mouth daily as needed for mild constipation or moderate constipation. 04/16/24   Johnson, Clanford L, MD  ergocalciferol  (VITAMIN D2) 1.25 MG (50000 UT) capsule Take 1 capsule (50,000 Units total) by mouth every 14 (fourteen) days. 07/10/24   Geofm Delon BRAVO, NP  ondansetron  (ZOFRAN ) 4 MG tablet Take 1 tablet (4 mg total) by mouth every 8 (eight) hours as needed for nausea or vomiting. Patient taking differently: Take 4 mg by mouth daily as needed for nausea or vomiting. 05/08/24    Francesca Elsie CROME, MD  oxyCODONE  (OXY IR/ROXICODONE ) 5 MG immediate release tablet Take 1 tablet (5 mg total) by mouth every 4 (four) hours as needed for moderate pain (pain score 4-6) or breakthrough pain. 10/10/24   Odell Celinda Balo, MD  pantoprazole  (PROTONIX ) 40 MG tablet Take 1 tablet (40 mg total) by mouth 2 (two) times daily before a meal. 06/04/24   Dettinger, Fonda LABOR, MD  polyethylene glycol (MIRALAX  / GLYCOLAX ) 17 g packet Take 17 g by mouth daily as needed for mild constipation. 04/16/24   Johnson, Clanford L, MD  pravastatin  (PRAVACHOL ) 40 MG tablet Take 1 tablet (40 mg total) by mouth every evening. Patient taking differently: Take 40 mg by mouth daily. 06/04/24   Dettinger, Fonda LABOR, MD    Allergies: Tetracyclines & related    Review of Systems  Unable to perform ROS: Mental status change  All other systems reviewed and are negative.   Updated Vital Signs BP 136/76 (BP Location: Left Arm)   Pulse 90   Temp 99.3 F (37.4 C) (Rectal)   Resp (!) 22   SpO2 95%   Physical Exam Vitals and nursing note reviewed.  Constitutional:      Appearance: Normal appearance.  HENT:     Head: Normocephalic and atraumatic.     Right Ear: External ear normal.  Left Ear: External ear normal.     Nose: Nose normal.     Mouth/Throat:     Mouth: Mucous membranes are dry.  Eyes:     Extraocular Movements: Extraocular movements intact.     Conjunctiva/sclera: Conjunctivae normal.     Pupils: Pupils are equal, round, and reactive to light.  Cardiovascular:     Rate and Rhythm: Normal rate and regular rhythm.     Pulses: Normal pulses.     Heart sounds: Normal heart sounds.  Pulmonary:     Effort: Pulmonary effort is normal.     Breath sounds: Normal breath sounds.  Abdominal:     General: Abdomen is flat. Bowel sounds are normal.     Palpations: Abdomen is soft.  Musculoskeletal:        General: Normal range of motion.     Cervical back: Normal range of motion and neck  supple.  Skin:    General: Skin is warm.     Capillary Refill: Capillary refill takes less than 2 seconds.  Neurological:     General: No focal deficit present.     Mental Status: She is alert. She is disoriented.  Psychiatric:        Attention and Perception: She perceives auditory and visual hallucinations.        Behavior: Behavior is agitated and actively hallucinating.     (all labs ordered are listed, but only abnormal results are displayed) Labs Reviewed  CBC WITH DIFFERENTIAL/PLATELET - Abnormal; Notable for the following components:      Result Value   WBC 14.0 (*)    RBC 3.78 (*)    Platelets 496 (*)    Neutro Abs 11.4 (*)    Monocytes Absolute 1.3 (*)    Abs Immature Granulocytes 0.10 (*)    All other components within normal limits  COMPREHENSIVE METABOLIC PANEL WITH GFR - Abnormal; Notable for the following components:   Sodium 134 (*)    Potassium 3.0 (*)    Chloride 97 (*)    Glucose, Bld 123 (*)    Alkaline Phosphatase 182 (*)    All other components within normal limits  URINALYSIS, W/ REFLEX TO CULTURE (INFECTION SUSPECTED) - Abnormal; Notable for the following components:   Color, Urine AMBER (*)    APPearance CLOUDY (*)    Ketones, ur 5 (*)    Bacteria, UA FEW (*)    All other components within normal limits  CBG MONITORING, ED - Abnormal; Notable for the following components:   Glucose-Capillary 129 (*)    All other components within normal limits  CULTURE, BLOOD (ROUTINE X 2)  CULTURE, BLOOD (ROUTINE X 2)  RESPIRATORY PANEL BY PCR  RESP PANEL BY RT-PCR (RSV, FLU A&B, COVID)  RVPGX2  MAGNESIUM   I-STAT CG4 LACTIC ACID, ED    EKG: None  Radiology: CT CHEST ABDOMEN PELVIS W CONTRAST Result Date: 10/16/2024 CLINICAL DATA:  Sepsis. EXAM: CT CHEST, ABDOMEN, AND PELVIS WITH CONTRAST TECHNIQUE: Multidetector CT imaging of the chest, abdomen and pelvis was performed following the standard protocol during bolus administration of intravenous contrast.  RADIATION DOSE REDUCTION: This exam was performed according to the departmental dose-optimization program which includes automated exposure control, adjustment of the mA and/or kV according to patient size and/or use of iterative reconstruction technique. CONTRAST:  OMNIPAQUE  IOHEXOL  300 MG/ML  SOLN COMPARISON:  Chest radiograph dated 10/16/2024 and pelvic CT dated 10/03/2024. FINDINGS: CT CHEST FINDINGS Cardiovascular: There is no cardiomegaly or pericardial effusion. There is  coronary vascular calcification. Moderate calcified and noncalcified plaque of the thoracic aorta. No aneurysmal dilatation or dissection. The origins of the great vessels of the aortic arch and the central pulmonary arteries are patent. Mediastinum/Nodes: No hilar or mediastinal adenopathy. There is a large hiatal hernia containing the majority of the stomach and a segment of the transverse colon. The esophagus is grossly unremarkable. No mediastinal fluid collection. Lungs/Pleura: Background of emphysema. There is partial compressive atelectasis of the lower lobes secondary to a large hiatal hernia. Faint cluster of nodular density in the right upper lobe likely post inflammatory or atypical infection. No consolidative changes. There is no pleural effusion pneumothorax. The central airways are patent. Musculoskeletal: No acute osseous pathology. Osteopenia with degenerative changes of the spine. Old sternal fracture. T12 vertebroplasty. CT ABDOMEN PELVIS FINDINGS No intra-abdominal free air or free fluid. Hepatobiliary: The liver is unremarkable. There is mild dilatation, post cholecystectomy. No retained calcified stone noted in the central CBD. Pancreas: Unremarkable. No pancreatic ductal dilatation or surrounding inflammatory changes. Spleen: Normal in size without focal abnormality. Adrenals/Urinary Tract: The adrenal glands unremarkable. There is no hydronephrosis on either side. There is symmetric enhancement and excretion of  contrast by both kidneys. The visualized ureters appear unremarkable the urinary bladder is poorly visualized due to streak artifact caused by bilateral total hip arthroplasties. Stomach/Bowel: There is no bowel obstruction or active inflammation. The appendix is normal. Vascular/Lymphatic: Advanced aortoiliac atherosclerotic disease. The IVC is unremarkable. No portal venous gas. There is no adenopathy. Reproductive: Evaluation of the pelvic structures is very limited due to streak artifact caused by bilateral hip arthroplasties. Other: None Musculoskeletal: Bilateral total hip arthroplasties. Displaced fractures of the right anterior and inferior pubic rami as well as insufficiency fractures of bilateral sacral ala. There is osteopenia with degenerative changes of the spine. IMPRESSION: 1. A cluster of nodular density in the right upper lobe, likely post inflammatory versus atypical pneumonia. Clinical correlation is recommended. 2. Large hiatal hernia containing the majority of the stomach and a segment of the transverse colon. No bowel obstruction. Normal appendix. 3. Displaced fractures of the right anterior and inferior pubic rami as well as insufficiency fractures of bilateral sacral ala. 4.  Aortic Atherosclerosis (ICD10-I70.0). Electronically Signed   By: Vanetta Chou M.D.   On: 10/16/2024 19:35   CT Head Wo Contrast Result Date: 10/16/2024 EXAM: CT HEAD WITHOUT CONTRAST 10/16/2024 07:08:51 PM TECHNIQUE: CT of the head was performed without the administration of intravenous contrast. Automated exposure control, iterative reconstruction, and/or weight based adjustment of the mA/kV was utilized to reduce the radiation dose to as low as reasonably achievable. COMPARISON: 06/26/2024 CLINICAL HISTORY: Mental status change, unknown cause. Pt BIB [facility] from [facility] with reports of AMS since Saturday morning. Pt has been more confused than normal. Pt has had recent fractures, oriented to  herself. Pt reports burning with urination. Right clavicle fx. FINDINGS: BRAIN AND VENTRICLES: No acute hemorrhage. No evidence of acute infarct. No hydrocephalus. No extra-axial collection. No mass effect or midline shift. Calcific atherosclerosis. ORBITS: No acute abnormality. SINUSES: Left sphenoid sinus mucosal thickening. SOFT TISSUES AND SKULL: No acute soft tissue abnormality. No skull fracture. LIMITATIONS/ARTIFACTS: Motion limited study. IMPRESSION: 1. Motion degraded exam. 2. No acute intracranial abnormality. 3. Chronic left sphenoid sinus disease. Electronically signed by: Morene Hoard MD 10/16/2024 07:31 PM EDT RP Workstation: [id number]   DG Chest Port 1 View Result Date: 10/16/2024 EXAM: 1 VIEW(S) XRAY OF THE CHEST 10/16/2024 03:29:00 PM COMPARISON: 10/03/2024 CLINICAL  HISTORY: [id number] AMS (altered mental status) [id number]. Per chart: Pt BIB [facility] from [facility] with reports of AMS since Saturday morning. Pt has been more confused than normal. Pt has had recent fractures, oriented to herself. Pt reports burning with urination. Right clavicle fx. 2L [employer] at ; baseline. FINDINGS: LUNGS AND PLEURA: No focal pulmonary opacity. No pulmonary edema. No pleural effusion. No pneumothorax. HEART AND MEDIASTINUM: No acute abnormality of the cardiac and mediastinal silhouettes. Large hiatal hernia is again noted. BONES AND SOFT TISSUES: No acute osseous abnormality. IMPRESSION: 1. No acute cardiopulmonary abnormality. 2. Large hiatal hernia. Electronically signed by: Lynwood Seip MD 10/16/2024 03:51 PM EDT RP Workstation: [id number]     Procedures   Medications Ordered in the ED  sodium chloride  0.9 % bolus 1,000 mL (has no administration in time range)  cefTRIAXone (ROCEPHIN) 1 g in sodium chloride  0.9 % 100 mL IVPB (has no administration in time range)  azithromycin  (ZITHROMAX ) 500 mg in sodium chloride  0.9 % 250 mL IVPB (has no administration in time range)  potassium chloride  10  mEq in 100 mL IVPB (has no administration in time range)  potassium chloride  10 mEq in 100 mL IVPB (has no administration in time range)  aspirin  EC tablet 81 mg (has no administration in time range)  buPROPion  (WELLBUTRIN  XL) 24 hr tablet 150 mg (has no administration in time range)  citalopram  (CELEXA ) tablet 40 mg (has no administration in time range)  docusate sodium  (COLACE) capsule 100 mg (has no administration in time range)  pantoprazole  (PROTONIX ) EC tablet 40 mg (has no administration in time range)  polyethylene glycol (MIRALAX  / GLYCOLAX ) packet 17 g (has no administration in time range)  pravastatin  (PRAVACHOL ) tablet 40 mg (has no administration in time range)  sodium chloride  0.9 % bolus 1,000 mL (0 mLs Intravenous Stopped 10/16/24 1827)  iohexol  (OMNIPAQUE ) 300 MG/ML solution 100 mL (100 mLs Intravenous Contrast Given 10/16/24 1849)                                    Medical Decision Making Amount and/or Complexity of Data Reviewed Labs: ordered. Radiology: ordered.  Risk Prescription drug management. Decision regarding hospitalization.   This patient presents to the ED for concern of ams, this involves an extensive number of treatment options, and is a complaint that carries with it a high risk of complications and morbidity.  The differential diagnosis includes infection, trauma, electrolyte abn   Co morbidities that complicate the patient evaluation  htn, depression, hld, gerd, anxiety, and anemia   Additional history obtained:  Additional history obtained from epic chart review External records from outside source obtained and reviewed including EMS report/sons   Lab Tests:  I Ordered, and personally interpreted labs.  The pertinent results include:  cbc with wbc elevated at 14; cmp nl other than k low at 3, lactic nl   Imaging Studies ordered:  I ordered imaging studies including cxr and ct chest/abd/pelvis I independently visualized and  interpreted imaging which showed  CXR:  No acute cardiopulmonary abnormality.  2. Large hiatal hernia.  CT head: Motion degraded exam.  2. No acute intracranial abnormality.  3. Chronic left sphenoid sinus disease.  CT chest/abd/pelvis: 1. A cluster of nodular density in the right upper lobe, likely post  inflammatory versus atypical pneumonia. Clinical correlation is  recommended.  2. Large hiatal hernia containing the majority of the stomach and  a  segment of the transverse colon. No bowel obstruction. Normal  appendix.  3. Displaced fractures of the right anterior and inferior pubic rami  as well as insufficiency fractures of bilateral sacral ala.  4.  Aortic Atherosclerosis (ICD10-I70.0).   I agree with the radiologist interpretation   Cardiac Monitoring:  The patient was maintained on a cardiac monitor.  I personally viewed and interpreted the cardiac monitored which showed an underlying rhythm of: st   Medicines ordered and prescription drug management:  I ordered medication including ivfs  for sx  Reevaluation of the patient after these medicines showed that the patient improved I have reviewed the patients home medicines and have made adjustments as needed   Test Considered:  ct   Critical Interventions:  Ivfs/abx   Consultations Obtained:  I requested consultation with the hospitalists (Dr. Lee),  and discussed lab and imaging findings as well as pertinent plan - she will admit   Problem List / ED Course:  Encephalopathy:  pt's sons said she was alert and oriented when she went to the facility.  She is now talking to her dead mother and very confused.  Possibly due to RUL abn vs medications.  She is given iv abx and ivfs.  She will need admission.   Reevaluation:  After the interventions noted above, I reevaluated the patient and found that they have :improved   Social Determinants of Health:  Lives at home   Dispostion:  After  consideration of the diagnostic results and the patients response to treatment, I feel that the patent would benefit from admission.       Final diagnoses:  Dehydration  Hypokalemia  Metabolic encephalopathy  Community acquired pneumonia of right upper lobe of lung    ED Discharge Orders     None          Dean Clarity, MD 10/16/24 2037

## 2024-10-16 NOTE — H&P (Addendum)
 History and Physical    Jody Wilson FMW:[id number] DOB: 1951-08-10 DOA: 10/16/2024  PCP: Dettinger, Fonda LABOR, MD   Patient coming from: Home   Chief Complaint:  Chief Complaint  Patient presents with   Altered Mental Status   ED TRIAGE note:Pt BIB [facility] from [facility] with reports of AMS since Saturday morning. Pt has been more confused than normal. Pt has had recent fractures, oriented to herself. Pt reports burning with urination. Right clavicle fx. 2L [place] at baseline.   HPI:  Jody Wilson is a 73 y.o. female with medical history significant of closed fracture of the right humerus and pubic ramus 10/15 orthopedics recommended conservative management/pain management/PT therapy followed by SNP placement on 10's 22, chronic osteoporosis, anxiety, depression, GERD, sinus bradycardia, essential hypertension iron deficiency anemia, chronic orthostatic hypotension, history of vertebral fracture presented to emergency department with complaining of development of confusion since Saturday and dysuria.  Patient is oriented to self only per nursing home report.  During the hospitalization last time patient developed atelectasis and hypoxic respiratory failure required oxygen initially.  Family members reported that patient has been hallucinating and seeing the dead mother in the room.  In the ED patient is alert however she is not completely oriented.  She is answering question.  During my evaluation the bedside patient is alert oriented to self.  She is endorsing productive cough and stating that feeling chest congestion.  Denies any sinus pain, pressure, sore throat, runny nose, shortness of breath wheezing and palpitation.  No other complaint at this time.   ED Course:  At presentation to ED patient was hypothermic initially 96 improved to 99, tachycardic tachypneic and borderline hypertensive.  Currently requiring 2 L of nasal cannula oxygen.  CT chest abdomen pelvis  showing atypical pneumonia. IMPRESSION: 1. A cluster of nodular density in the right upper lobe, likely post inflammatory versus atypical pneumonia. Clinical correlation is recommended. 2. Large hiatal hernia containing the majority of the stomach and a segment of the transverse colon. No bowel obstruction. Normal appendix. 3. Displaced fractures of the right anterior and inferior pubic rami as well as insufficiency fractures of bilateral sacral ala. 4.  Aortic Atherosclerosis (ICD10-I70.0).  CT head no acute intracranial abnormality. Chest x-ray no acute disease process.  Large hiatal hernia.  Lab, no evidence of UTI.  Normal lactic acid level.  Blood cultures are in process.  CBC showed leukocytosis 14 otherwise unremarkable.  CMP showing low sodium 134, low potassium 3, and elevated alkaline phosphatase 182.  In the ED patient received azithromycin , ceftriaxone, IV KCl 10 mEq and 2 L of NS bolus.  Hospitalist has been consulted for new recomment of oxygen in the setting of pneumonia, hypokalemia and altered mental status in the setting of pneumonia.  Significant labs in the ED: Lab Orders         Culture, blood (Routine X 2) w Reflex to ID Panel         Respiratory (~20 pathogens) panel by PCR         Culture, blood (routine x 2) Call MD if unable to obtain prior to antibiotics being given         Expectorated Sputum Assessment w Gram Stain, Rflx to Resp Cult         MRSA Next Gen by PCR, Nasal         CBC with Differential         Comprehensive metabolic panel  Urinalysis, w/ Reflex to Culture (Infection Suspected) -Urine, Catheterized         Magnesium          Strep pneumoniae urinary antigen         Legionella Pneumophila Serogp 1 Ur Ag         Procalcitonin         Ammonia         TSH         CBC         Comprehensive metabolic panel         CBG monitoring, ED         I-Stat CG4 Lactic Acid, ED ([facility] and [facility] ONLY)       Review of Systems:  Review of Systems   Constitutional:  Negative for chills, fever, malaise/fatigue and weight loss.  Eyes:  Negative for blurred vision.  Respiratory:  Negative for cough, sputum production and shortness of breath.   Cardiovascular:  Negative for chest pain, palpitations and leg swelling.  Gastrointestinal:  Negative for abdominal pain, diarrhea, heartburn, nausea and vomiting.  Genitourinary:  Negative for dysuria, frequency, hematuria and urgency.  Neurological:  Negative for dizziness and headaches.  Psychiatric/Behavioral:  The patient is not nervous/anxious.     Past Medical History:  Diagnosis Date   Anemia    Anxiety    Aortic atherosclerosis    B12 deficiency 09/25/2019   Carotid artery disease    Depression    GERD (gastroesophageal reflux disease)    Hyperlipidemia    Hypertension    Orthostatic hypotension    Osteoporosis    Pulmonary nodule 02/09/2023   Incidental finding on chest x-ray 02/09/2023- Nodule in the RIGHT upper lobe measuring approximately 7 mm may be new from previous chest radiographs though could be a portion of scarring that is seen in the upper chest. Consider short interval PA and lateral chest radiograph when the patient is able. If persistent CT may be warranted.  - F/u as outpatient      Past Surgical History:  Procedure Laterality Date   ABDOMINAL HYSTERECTOMY     BACK SURGERY     CHOLECYSTECTOMY     FOOT SURGERY     7 in the past   HIP ARTHROPLASTY Right 02/11/2023   Procedure: ARTHROPLASTY BIPOLAR HIP (HEMIARTHROPLASTY);  Surgeon: Onesimo Oneil LABOR, MD;  Location: [facility];  Service: Orthopedics;  Laterality: Right;   HIP ARTHROPLASTY Left 04/13/2024   Procedure: HEMIARTHROPLASTY (BIPOLAR) HIP, DIRECT LATERAL  FOR FRACTURE;  Surgeon: Margrette Taft BRAVO, MD;  Location: [facility];  Service: Orthopedics;  Laterality: Left;   IR GENERIC HISTORICAL  12/09/2016   IR RADIOLOGIST EVAL & MGMT 12/09/2016 [facility]   IR GENERIC HISTORICAL  12/10/2016   IR VERTEBROPLASTY  CERV/THOR BX INC UNI/BIL INC/INJECT/IMAGING 12/10/2016 [facility]   LEFT HEART CATH AND CORONARY ANGIOGRAPHY N/A 09/30/2020   Procedure: LEFT HEART CATH AND CORONARY ANGIOGRAPHY;  Surgeon: Elmira Newman PARAS, MD;  Location: [facility];  Service: Cardiovascular;  Laterality: N/A;   LEFT HEART CATH AND CORONARY ANGIOGRAPHY N/A 06/27/2024   Procedure: LEFT HEART CATH AND CORONARY ANGIOGRAPHY;  Surgeon: Verlin Lonni BIRCH, MD;  Location: [facility];  Service: Cardiovascular;  Laterality: N/A;   TRACHEOSTOMY TUBE PLACEMENT  1982     reports that she quit smoking about 19 months ago. Her smoking use included cigarettes. She started smoking about 26 years ago. She has a 6.3 pack-year smoking history. She has never been  exposed to tobacco smoke. She has never used smokeless tobacco. She reports that she does not currently use alcohol. She reports that she does not use drugs.  Allergies  Allergen Reactions   Tetracyclines & Related Itching and Swelling    Family History  Problem Relation Age of Onset   Heart disease Mother    Hypertension Mother    Hyperlipidemia Mother    Diabetes Mother    Congestive Heart Failure Mother    Aneurysm Paternal Uncle    Heart attack Maternal Grandmother    Heart attack Maternal Grandfather    Hypertension Son    Other Son 45       Kienbock's Disease   Liver disease Son 41       Gilbert's Disease   Lupus Half-Sister    Atrial fibrillation Half-Sister    Breast cancer Niece     Prior to Admission medications   Medication Sig Start Date End Date Taking? Authorizing Provider  ALPRAZolam  (XANAX ) 0.5 MG tablet Take 0.5 tablets (0.25 mg total) by mouth 2 (two) times daily as needed for anxiety. 10/10/24   Odell Celinda Balo, MD  aspirin  EC 81 MG tablet TAKE 1 PO BID THRU 05/13/24 THEN RESUME 1 PO DAILY Patient taking differently: Take 81 mg by mouth daily. 04/16/24   Johnson, Clanford L, MD  buPROPion  (WELLBUTRIN  XL) 150 MG 24 hr tablet  Take 1 tablet (150 mg total) by mouth daily. 06/04/24   Dettinger, Fonda LABOR, MD  citalopram  (CELEXA ) 40 MG tablet Take 1 tablet (40 mg total) by mouth daily. 06/04/24   Dettinger, Fonda LABOR, MD  docusate sodium  (COLACE) 100 MG capsule Take 1 capsule (100 mg total) by mouth 2 (two) times daily. Patient taking differently: Take 100 mg by mouth daily as needed for mild constipation or moderate constipation. 04/16/24   Johnson, Clanford L, MD  ergocalciferol  (VITAMIN D2) 1.25 MG (50000 UT) capsule Take 1 capsule (50,000 Units total) by mouth every 14 (fourteen) days. 07/10/24   Geofm Delon BRAVO, NP  ondansetron  (ZOFRAN ) 4 MG tablet Take 1 tablet (4 mg total) by mouth every 8 (eight) hours as needed for nausea or vomiting. Patient taking differently: Take 4 mg by mouth daily as needed for nausea or vomiting. 05/08/24   Francesca Elsie CROME, MD  oxyCODONE  (OXY IR/ROXICODONE ) 5 MG immediate release tablet Take 1 tablet (5 mg total) by mouth every 4 (four) hours as needed for moderate pain (pain score 4-6) or breakthrough pain. 10/10/24   Odell Celinda Balo, MD  pantoprazole  (PROTONIX ) 40 MG tablet Take 1 tablet (40 mg total) by mouth 2 (two) times daily before a meal. 06/04/24   Dettinger, Fonda LABOR, MD  polyethylene glycol (MIRALAX  / GLYCOLAX ) 17 g packet Take 17 g by mouth daily as needed for mild constipation. 04/16/24   Johnson, Clanford L, MD  pravastatin  (PRAVACHOL ) 40 MG tablet Take 1 tablet (40 mg total) by mouth every evening. Patient taking differently: Take 40 mg by mouth daily. 06/04/24   Dettinger, Fonda LABOR, MD     Physical Exam: Vitals:   10/16/24 2045 10/16/24 2100 10/16/24 2115 10/16/24 2150  BP: (!) 156/105 (!) 156/92 (!) 156/132   Pulse:  98 90   Resp: (!) 28 (!) 24 20   Temp:    98 F (36.7 C)  TempSrc:    Oral  SpO2:  95% 96%     Physical Exam Constitutional:      Appearance: She is not ill-appearing.  HENT:  Nose: Nose normal.     Mouth/Throat:     Pharynx: Oropharynx is  clear.  Eyes:     Pupils: Pupils are equal, round, and reactive to light.  Cardiovascular:     Rate and Rhythm: Normal rate and regular rhythm.     Pulses: Normal pulses.     Heart sounds: Normal heart sounds.  Pulmonary:     Effort: Pulmonary effort is normal.     Breath sounds: Normal breath sounds.  Abdominal:     Palpations: Abdomen is soft.  Musculoskeletal:     Cervical back: Neck supple.     Right lower leg: No edema.     Left lower leg: No edema.  Skin:    Capillary Refill: Capillary refill takes less than 2 seconds.  Neurological:     Mental Status: She is alert.     Comments: Alert oriented to self.  Psychiatric:     Comments: Unable to assess due to confusion      Labs on Admission: I have personally reviewed following labs and imaging studies  CBC: Recent Labs  Lab 10/10/24 0332 10/16/24 1616  WBC 5.9 14.0*  NEUTROABS 3.6 11.4*  HGB 11.8* 12.2  HCT 36.1 36.7  MCV 98.1 97.1  PLT 302 496*   Basic Metabolic Panel: Recent Labs  Lab 10/10/24 0332 10/16/24 1616  NA 136 134*  K 3.9 3.0*  CL 98 97*  CO2 28 24  GLUCOSE 104* 123*  BUN 13 12  CREATININE 0.58 0.62  CALCIUM  9.7 9.8  MG 1.9  --    GFR: Estimated Creatinine Clearance: 53.9 mL/min (by C-G formula based on SCr of 0.62 mg/dL). Liver Function Tests: Recent Labs  Lab 10/10/24 0332 10/16/24 1616  AST 16 20  ALT 21 13  ALKPHOS 190* 182*  BILITOT 1.1 0.9  PROT 6.5 7.2  ALBUMIN 3.5 3.8   No results for input(s): LIPASE, AMYLASE in the last 168 hours. No results for input(s): AMMONIA in the last 168 hours. Coagulation Profile: No results for input(s): INR, PROTIME in the last 168 hours. Cardiac Enzymes: No results for input(s): CKTOTAL, CKMB, CKMBINDEX, TROPONINI, TROPONINIHS in the last 168 hours. BNP (last 3 results) Recent Labs    06/26/24 2009  BNP 56.0   HbA1C: No results for input(s): HGBA1C in the last 72 hours. CBG: Recent Labs  Lab 10/16/24 1543   GLUCAP 129*   Lipid Profile: No results for input(s): CHOL, HDL, LDLCALC, TRIG, CHOLHDL, LDLDIRECT in the last 72 hours. Thyroid  Function Tests: No results for input(s): TSH, T4TOTAL, FREET4, T3FREE, THYROIDAB in the last 72 hours. Anemia Panel: No results for input(s): VITAMINB12, FOLATE, FERRITIN, TIBC, IRON, RETICCTPCT in the last 72 hours. Urine analysis:    Component Value Date/Time   COLORURINE AMBER (A) 10/16/2024 1807   APPEARANCEUR CLOUDY (A) 10/16/2024 1807   APPEARANCEUR Clear 01/22/2022 1411   LABSPEC 1.014 10/16/2024 1807   PHURINE 8.0 10/16/2024 1807   GLUCOSEU NEGATIVE 10/16/2024 1807   HGBUR NEGATIVE 10/16/2024 1807   BILIRUBINUR NEGATIVE 10/16/2024 1807   BILIRUBINUR Negative 01/22/2022 1411   KETONESUR 5 (A) 10/16/2024 1807   PROTEINUR NEGATIVE 10/16/2024 1807   UROBILINOGEN negative 04/11/2014 1713   NITRITE NEGATIVE 10/16/2024 1807   LEUKOCYTESUR NEGATIVE 10/16/2024 1807    Radiological Exams on Admission: I have personally reviewed images CT CHEST ABDOMEN PELVIS W CONTRAST Result Date: 10/16/2024 CLINICAL DATA:  Sepsis. EXAM: CT CHEST, ABDOMEN, AND PELVIS WITH CONTRAST TECHNIQUE: Multidetector CT imaging of the chest, abdomen  and pelvis was performed following the standard protocol during bolus administration of intravenous contrast. RADIATION DOSE REDUCTION: This exam was performed according to the departmental dose-optimization program which includes automated exposure control, adjustment of the mA and/or kV according to patient size and/or use of iterative reconstruction technique. CONTRAST:  OMNIPAQUE  IOHEXOL  300 MG/ML  SOLN COMPARISON:  Chest radiograph dated 10/16/2024 and pelvic CT dated 10/03/2024. FINDINGS: CT CHEST FINDINGS Cardiovascular: There is no cardiomegaly or pericardial effusion. There is coronary vascular calcification. Moderate calcified and noncalcified plaque of the thoracic aorta. No aneurysmal  dilatation or dissection. The origins of the great vessels of the aortic arch and the central pulmonary arteries are patent. Mediastinum/Nodes: No hilar or mediastinal adenopathy. There is a large hiatal hernia containing the majority of the stomach and a segment of the transverse colon. The esophagus is grossly unremarkable. No mediastinal fluid collection. Lungs/Pleura: Background of emphysema. There is partial compressive atelectasis of the lower lobes secondary to a large hiatal hernia. Faint cluster of nodular density in the right upper lobe likely post inflammatory or atypical infection. No consolidative changes. There is no pleural effusion pneumothorax. The central airways are patent. Musculoskeletal: No acute osseous pathology. Osteopenia with degenerative changes of the spine. Old sternal fracture. T12 vertebroplasty. CT ABDOMEN PELVIS FINDINGS No intra-abdominal free air or free fluid. Hepatobiliary: The liver is unremarkable. There is mild dilatation, post cholecystectomy. No retained calcified stone noted in the central CBD. Pancreas: Unremarkable. No pancreatic ductal dilatation or surrounding inflammatory changes. Spleen: Normal in size without focal abnormality. Adrenals/Urinary Tract: The adrenal glands unremarkable. There is no hydronephrosis on either side. There is symmetric enhancement and excretion of contrast by both kidneys. The visualized ureters appear unremarkable the urinary bladder is poorly visualized due to streak artifact caused by bilateral total hip arthroplasties. Stomach/Bowel: There is no bowel obstruction or active inflammation. The appendix is normal. Vascular/Lymphatic: Advanced aortoiliac atherosclerotic disease. The IVC is unremarkable. No portal venous gas. There is no adenopathy. Reproductive: Evaluation of the pelvic structures is very limited due to streak artifact caused by bilateral hip arthroplasties. Other: None Musculoskeletal: Bilateral total hip arthroplasties.  Displaced fractures of the right anterior and inferior pubic rami as well as insufficiency fractures of bilateral sacral ala. There is osteopenia with degenerative changes of the spine. IMPRESSION: 1. A cluster of nodular density in the right upper lobe, likely post inflammatory versus atypical pneumonia. Clinical correlation is recommended. 2. Large hiatal hernia containing the majority of the stomach and a segment of the transverse colon. No bowel obstruction. Normal appendix. 3. Displaced fractures of the right anterior and inferior pubic rami as well as insufficiency fractures of bilateral sacral ala. 4.  Aortic Atherosclerosis (ICD10-I70.0). Electronically Signed   By: Vanetta Chou M.D.   On: 10/16/2024 19:35   CT Head Wo Contrast Result Date: 10/16/2024 EXAM: CT HEAD WITHOUT CONTRAST 10/16/2024 07:08:51 PM TECHNIQUE: CT of the head was performed without the administration of intravenous contrast. Automated exposure control, iterative reconstruction, and/or weight based adjustment of the mA/kV was utilized to reduce the radiation dose to as low as reasonably achievable. COMPARISON: 06/26/2024 CLINICAL HISTORY: Mental status change, unknown cause. Pt BIB [facility] from [facility] with reports of AMS since Saturday morning. Pt has been more confused than normal. Pt has had recent fractures, oriented to herself. Pt reports burning with urination. Right clavicle fx. FINDINGS: BRAIN AND VENTRICLES: No acute hemorrhage. No evidence of acute infarct. No hydrocephalus. No extra-axial collection. No mass effect or  midline shift. Calcific atherosclerosis. ORBITS: No acute abnormality. SINUSES: Left sphenoid sinus mucosal thickening. SOFT TISSUES AND SKULL: No acute soft tissue abnormality. No skull fracture. LIMITATIONS/ARTIFACTS: Motion limited study. IMPRESSION: 1. Motion degraded exam. 2. No acute intracranial abnormality. 3. Chronic left sphenoid sinus disease. Electronically signed by: Morene Hoard MD 10/16/2024 07:31 PM EDT RP Workstation: [id number]   DG Chest Port 1 View Result Date: 10/16/2024 EXAM: 1 VIEW(S) XRAY OF THE CHEST 10/16/2024 03:29:00 PM COMPARISON: 10/03/2024 CLINICAL HISTORY: [id number] AMS (altered mental status) [id number]. Per chart: Pt BIB [facility] from [facility] with reports of AMS since Saturday morning. Pt has been more confused than normal. Pt has had recent fractures, oriented to herself. Pt reports burning with urination. Right clavicle fx. 2L [place] at ; baseline. FINDINGS: LUNGS AND PLEURA: No focal pulmonary opacity. No pulmonary edema. No pleural effusion. No pneumothorax. HEART AND MEDIASTINUM: No acute abnormality of the cardiac and mediastinal silhouettes. Large hiatal hernia is again noted. BONES AND SOFT TISSUES: No acute osseous abnormality. IMPRESSION: 1. No acute cardiopulmonary abnormality. 2. Large hiatal hernia. Electronically signed by: Lynwood Seip MD 10/16/2024 03:51 PM EDT RP Workstation: [id number]      Assessment/Plan: Principal Problem:   Community acquired pneumonia Active Problems:   Altered mental status, unspecified   Essential hypertension   Hyperlipidemia   GERD (gastroesophageal reflux disease)   Iron deficiency anemia   Anxiety and depression   Closed fracture of neck of left femur (HCC)   Closed fracture of right inferior pubic ramus (HCC)   History of bradycardia   Sepsis (HCC)    Assessment and Plan: Community-acquired pneumonia Sepsis in the setting of pneumonia -Presented emergency department from nursing with complaining of confusion patient is alert oriented to self only and also hallucinating.  At presentation to ED patient is tachycardic.  Normal lactic acid level.  CBC showing leukocytosis.  UA no evidence of UTI.  Pending respiratory panel, sputum culture, blood culture, urine Legionella antigen and urine strep antigen and procalcitonin level. - Chest x-ray unremarkable.  CTA chest abdomen pelvis showed  concern for atypical pneumonia. - In the ED patient received 2 L of NS bolus, azithromycin  and ceftriaxone. - Given patient recently being admitted high risk for for MRSA and Pseudomonas associated pneumonia. - Starting broad-spectrum antibiotic coverage vancomycin  and cefepime.  Need to follow-up with culture results for appropriate antibiotic guidance. - Continue maintenance fluid LR 100 cc/h. -Continue cardiac monitoring   Altered mental status in terms of confusion in the setting of pneum onia and polypharmacy -Presented emergency department for hallucination and alert oriented to self.  Concern for altered mental status in terms of confusion in setting of pneumonia and polypharmacy patient is taking Xanax  Wellbutrin  and Celexa . -CT head unremarkable. -Checking ammonia and TSH level - Holding Xanax .  Continue other antianxiety medication include Wellbutrin  and Celexa . -Continue delirium precaution.   Essential hypertension History of bradycardia-resolved -At home patient is not any blood pressure regimen currently because when patient was recently admitted she became bradycardic and hypotensive.  Generalized anxiety disorder History of depression -Continue Celexa  and Wellbutrin .  Holding Xanax  in the setting of confusion.  Iron deficiency anemia Stable H&H.  Continue to monitor   Recent fracture of the right pubic rami, nondisplaced right sacral alar and closed fracture of the proximal right humerus - Currently on conservative management.  Continue PT therapy while patient inpatient.    DVT prophylaxis:  Lovenox  SCD and TED hose Code Status:  Full Code Diet:  Heart healthy diet Family Communication:   Family was present at bedside, at the time of interview. Opportunity was given to ask question and all questions were answered satisfactorily.  Disposition Plan: Continue to monitor improvement of confusion.  Need to follow-up with culture results. Consults: None indicated at  this time Admission status:   Inpatient, Telemetry bed  Severity of Illness: The appropriate patient status for this patient is INPATIENT. Inpatient status is judged to be reasonable and necessary in order to provide the required intensity of service to ensure the patient's safety. The patient's presenting symptoms, physical exam findings, and initial radiographic and laboratory data in the context of their chronic comorbidities is felt to place them at high risk for further clinical deterioration. Furthermore, it is not anticipated that the patient will be medically stable for discharge from the hospital within 2 midnights of admission.   * I certify that at the point of admission it is my clinical judgment that the patient will require inpatient hospital care spanning beyond 2 midnights from the point of admission due to high intensity of service, high risk for further deterioration and high frequency of surveillance required.DEWAINE    Wai Minotti, MD [facility]  How to contact the [facility] Attending or Consulting provider 7A - 7P or covering provider during after hours 7P -7A, for this patient.  Check the care team in [facility] and look for a) attending/consulting [facility] provider listed and b) the [facility] team listed Log into [email or web address] and use [place]'s universal password to access. If you do not have the password, please contact the hospital operator. Locate the [facility] provider you are looking for under [facility] and page to a number that you can be directly reached. If you still have difficulty reaching the provider, please page the [facility] (Director on Call) for the Hospitalists listed on amion for assistance.  10/16/2024, 10:06 PM

## 2024-10-17 ENCOUNTER — Inpatient Hospital Stay (HOSPITAL_COMMUNITY)

## 2024-10-17 DIAGNOSIS — F05 Delirium due to known physiological condition: Secondary | ICD-10-CM

## 2024-10-17 DIAGNOSIS — E876 Hypokalemia: Secondary | ICD-10-CM | POA: Diagnosis not present

## 2024-10-17 DIAGNOSIS — F32A Depression, unspecified: Secondary | ICD-10-CM | POA: Diagnosis not present

## 2024-10-17 DIAGNOSIS — J189 Pneumonia, unspecified organism: Secondary | ICD-10-CM | POA: Diagnosis not present

## 2024-10-17 DIAGNOSIS — I10 Essential (primary) hypertension: Secondary | ICD-10-CM | POA: Diagnosis not present

## 2024-10-17 LAB — URINE DRUG SCREEN
Amphetamines: NEGATIVE
Barbiturates: NEGATIVE
Benzodiazepines: NEGATIVE
Cocaine: NEGATIVE
Fentanyl: NEGATIVE
Methadone Scn, Ur: NEGATIVE
Opiates: NEGATIVE
Tetrahydrocannabinol: NEGATIVE

## 2024-10-17 LAB — CBC
HCT: 37.6 % (ref 36.0–46.0)
Hemoglobin: 12 g/dL (ref 12.0–15.0)
MCH: 32.1 pg (ref 26.0–34.0)
MCHC: 31.9 g/dL (ref 30.0–36.0)
MCV: 100.5 fL — ABNORMAL HIGH (ref 80.0–100.0)
Platelets: 513 K/uL — ABNORMAL HIGH (ref 150–400)
RBC: 3.74 MIL/uL — ABNORMAL LOW (ref 3.87–5.11)
RDW: 13.6 % (ref 11.5–15.5)
WBC: 11.9 K/uL — ABNORMAL HIGH (ref 4.0–10.5)
nRBC: 0 % (ref 0.0–0.2)

## 2024-10-17 LAB — COMPREHENSIVE METABOLIC PANEL WITH GFR
ALT: 13 U/L (ref 0–44)
AST: 20 U/L (ref 15–41)
Albumin: 3.5 g/dL (ref 3.5–5.0)
Alkaline Phosphatase: 179 U/L — ABNORMAL HIGH (ref 38–126)
Anion gap: 10 (ref 5–15)
BUN: 10 mg/dL (ref 8–23)
CO2: 24 mmol/L (ref 22–32)
Calcium: 9.1 mg/dL (ref 8.9–10.3)
Chloride: 103 mmol/L (ref 98–111)
Creatinine, Ser: 0.53 mg/dL (ref 0.44–1.00)
GFR, Estimated: >60 mL/min (ref >60)
Glucose, Bld: 94 mg/dL (ref 70–99)
Potassium: 3 mmol/L — ABNORMAL LOW (ref 3.5–5.1)
Sodium: 136 mmol/L (ref 135–145)
Total Bilirubin: 0.8 mg/dL (ref 0.0–1.2)
Total Protein: 6.9 g/dL (ref 6.5–8.1)

## 2024-10-17 LAB — STREP PNEUMONIAE URINARY ANTIGEN: Strep Pneumo Urinary Antigen: NEGATIVE

## 2024-10-17 LAB — RESPIRATORY PANEL BY PCR

## 2024-10-17 LAB — CK: Total CK: 206 U/L (ref 38–234)

## 2024-10-17 LAB — PROCALCITONIN: Procalcitonin: 0.1 ng/mL

## 2024-10-17 LAB — MRSA NEXT GEN BY PCR, NASAL: MRSA by PCR Next Gen: NOT DETECTED

## 2024-10-17 LAB — TSH: TSH: 1.72 u[IU]/mL (ref 0.350–4.500)

## 2024-10-17 LAB — GLUCOSE, CAPILLARY: Glucose-Capillary: 119 mg/dL — ABNORMAL HIGH (ref 70–99)

## 2024-10-17 LAB — ETHANOL: Alcohol, Ethyl (B): <15 mg/dL (ref <15)

## 2024-10-17 LAB — AMMONIA: Ammonia: 28 umol/L (ref 9–35)

## 2024-10-17 LAB — MAGNESIUM: Magnesium: 1.8 mg/dL (ref 1.7–2.4)

## 2024-10-17 MED ORDER — QUETIAPINE FUMARATE 50 MG PO TABS
50.0000 mg | ORAL_TABLET | Freq: Every day | ORAL | Status: DC
  Administered 2024-10-18 – 2024-10-21 (×4): 50 mg via ORAL
  Filled 2024-10-17 (×4): qty 1

## 2024-10-17 MED ORDER — IRBESARTAN 150 MG PO TABS
150.0000 mg | ORAL_TABLET | Freq: Every day | ORAL | Status: DC
  Administered 2024-10-17 – 2024-10-22 (×6): 150 mg via ORAL
  Filled 2024-10-17 (×6): qty 1

## 2024-10-17 MED ORDER — HALOPERIDOL LACTATE 5 MG/ML IJ SOLN
2.0000 mg | Freq: Once | INTRAMUSCULAR | Status: AC
  Administered 2024-10-18: 2 mg via INTRAVENOUS
  Filled 2024-10-17: qty 1

## 2024-10-17 MED ORDER — ACETAMINOPHEN 10 MG/ML IV SOLN
1000.0000 mg | Freq: Three times a day (TID) | INTRAVENOUS | Status: DC
Start: 2024-10-17 — End: 2024-10-17
  Administered 2024-10-17: 1000 mg via INTRAVENOUS
  Filled 2024-10-17 (×2): qty 100

## 2024-10-17 MED ORDER — SODIUM CHLORIDE 0.9 % IV SOLN
500.0000 mg | INTRAVENOUS | Status: DC
  Administered 2024-10-17 – 2024-10-19 (×3): 500 mg via INTRAVENOUS
  Filled 2024-10-17 (×3): qty 5

## 2024-10-17 MED ORDER — ALPRAZOLAM 0.25 MG PO TABS
0.2500 mg | ORAL_TABLET | Freq: Two times a day (BID) | ORAL | Status: DC | PRN
  Administered 2024-10-17: 0.25 mg via ORAL
  Filled 2024-10-17: qty 1

## 2024-10-17 MED ORDER — SODIUM CHLORIDE 0.9 % IV SOLN
2.0000 g | INTRAVENOUS | Status: DC
  Administered 2024-10-17 – 2024-10-21 (×5): 2 g via INTRAVENOUS
  Filled 2024-10-17 (×5): qty 20

## 2024-10-17 MED ORDER — SODIUM CHLORIDE 0.9 % IV SOLN: INTRAVENOUS | Status: DC

## 2024-10-17 MED ORDER — CARVEDILOL 3.125 MG PO TABS
3.1250 mg | ORAL_TABLET | Freq: Two times a day (BID) | ORAL | Status: DC
  Administered 2024-10-17 – 2024-10-19 (×4): 3.125 mg via ORAL
  Filled 2024-10-17 (×4): qty 1

## 2024-10-17 MED ORDER — LORAZEPAM 2 MG/ML IJ SOLN
0.5000 mg | Freq: Once | INTRAMUSCULAR | Status: AC
  Administered 2024-10-17: 0.5 mg via INTRAVENOUS
  Filled 2024-10-17: qty 0.25

## 2024-10-17 MED ORDER — METOPROLOL TARTRATE 5 MG/5ML IV SOLN
5.0000 mg | Freq: Four times a day (QID) | INTRAVENOUS | Status: DC | PRN
  Administered 2024-10-17 (×2): 5 mg via INTRAVENOUS
  Filled 2024-10-17 (×2): qty 5

## 2024-10-17 MED ORDER — ACETAMINOPHEN 10 MG/ML IV SOLN
1000.0000 mg | Freq: Three times a day (TID) | INTRAVENOUS | Status: AC
  Administered 2024-10-18 (×2): 1000 mg via INTRAVENOUS
  Filled 2024-10-17: qty 100

## 2024-10-17 MED ORDER — POTASSIUM CHLORIDE 20 MEQ PO PACK
60.0000 meq | PACK | ORAL | Status: AC
  Administered 2024-10-17: 60 meq via ORAL
  Filled 2024-10-17: qty 3

## 2024-10-17 MED ORDER — OXYCODONE HCL 5 MG PO TABS: 5.0000 mg | ORAL_TABLET | ORAL | Status: DC | PRN

## 2024-10-17 MED ORDER — CARVEDILOL 3.125 MG PO TABS: 3.1250 mg | ORAL_TABLET | Freq: Two times a day (BID) | ORAL | Status: DC

## 2024-10-17 MED ORDER — HYDROMORPHONE HCL 1 MG/ML IJ SOLN
0.5000 mg | INTRAMUSCULAR | Status: DC | PRN
  Administered 2024-10-17 (×2): 0.5 mg via INTRAVENOUS
  Filled 2024-10-17 (×2): qty 0.5

## 2024-10-17 MED ORDER — HALOPERIDOL LACTATE 5 MG/ML IJ SOLN
1.0000 mg | Freq: Four times a day (QID) | INTRAMUSCULAR | Status: DC | PRN
  Administered 2024-10-17: 1 mg via INTRAMUSCULAR
  Filled 2024-10-17: qty 1

## 2024-10-17 MED ORDER — HALOPERIDOL 2 MG PO TABS: 5.0000 mg | ORAL_TABLET | Freq: Four times a day (QID) | ORAL | Status: DC | PRN

## 2024-10-17 MED ORDER — HALOPERIDOL LACTATE 5 MG/ML IJ SOLN
5.0000 mg | Freq: Once | INTRAMUSCULAR | Status: AC
  Administered 2024-10-17: 5 mg via INTRAVENOUS
  Filled 2024-10-17: qty 1

## 2024-10-17 NOTE — TOC Initial Note (Signed)
 Transition of Care [facility]) - Initial/Assessment Note   Patient Details  Name: Jody Wilson MRN: [id number] Date of Birth: [birth year]  Transition of Care [facility]) CM/SW Contact:    Duwaine GORMAN Aran, LCSW Phone Number: 10/17/2024, 1:06 PM  Clinical Narrative: Patient was admitted from [facility]. Patient is currently oriented x1. CSW spoke with son, Latacha Texeira, regarding discharge plan. Per son, he is unsure if the plan is to return to [facility] or not so he will need some time to consider the patient's options. PT consulted. Care management awaiting recommendations.  Expected Discharge Plan: Skilled Nursing Facility Barriers to Discharge: Continued Medical Work up  Expected Discharge Plan and Services In-house Referral: Clinical Social Work Post Acute Care Choice: Skilled Nursing Facility Living arrangements for the past 2 months: Single Family Home          DME Arranged: N/A DME Agency: NA  Prior Living Arrangements/Services Living arrangements for the past 2 months: Single Family Home Patient language and need for interpreter reviewed:: Yes Do you feel safe going back to the place where you live?: Yes      Need for Family Participation in Patient Care: Yes (Comment) (Patient is currently oriented to self only.) Care giver support system in place?: Yes (comment) Current home services: DME (Cane, crutches, walker, shower chair) Criminal Activity/Legal Involvement Pertinent to Current Situation/Hospitalization: No - Comment as needed  Activities of Daily Living ADL Screening (condition at time of admission) Independently performs ADLs?: No Does the patient have a NEW difficulty with bathing/dressing/toileting/self-feeding that is expected to last >3 days?: No Does the patient have a NEW difficulty with getting in/out of bed, walking, or climbing stairs that is expected to last >3 days?: No Does the patient have a NEW difficulty with communication that is expected to last >3  days?: No Is the patient deaf or have difficulty hearing?: No Does the patient have difficulty seeing, even when wearing glasses/contacts?: No Does the patient have difficulty concentrating, remembering, or making decisions?: Yes  Emotional Assessment Orientation: : Oriented to Self Alcohol / Substance Use: Not Applicable Psych Involvement: No (comment)  Admission diagnosis:  Metabolic encephalopathy [G93.41] Dehydration [E86.0] Hypokalemia [E87.6] Community acquired pneumonia [J18.9] Community acquired pneumonia of right upper lobe of lung [J18.9] Patient Active Problem List   Diagnosis Date Noted   Community acquired pneumonia 10/16/2024   Altered mental status, unspecified 10/16/2024   History of bradycardia 10/16/2024   Sepsis (HCC) 10/16/2024   Closed fracture of proximal end of right humerus 10/03/2024   Closed fracture of right inferior pubic ramus (HCC) 10/03/2024   Sinus bradycardia 10/03/2024   NSTEMI (non-ST elevated myocardial infarction) (HCC) 06/29/2024   Syncope 06/27/2024   Elevated troponin 06/27/2024   Acute respiratory failure with hypoxia (HCC) 06/27/2024   Abnormal CT of liver 06/26/2024   Incontinence of feces 05/08/2024   Closed fracture of neck of left femur (HCC) 04/13/2024   Hypokalemia 04/13/2024   Closed hip fracture requiring operative repair with routine healing 02/09/2023   Pulmonary nodule 02/09/2023   Hypertriglyceridemia 01/22/2022   Abnormal findings on diagnostic imaging of liver and biliary tract 12/09/2021   Diarrhea 12/09/2021   History of colonic polyps 12/09/2021   Anxiety and depression 06/23/2020   Hiatal hernia 06/23/2020   Thoracic aortic atherosclerosis 06/23/2020   Smoking history 04/25/2020   Involuntary muscle jerks while sleeping 09/26/2019   B12 deficiency 09/25/2019   Iron deficiency anemia 09/25/2019   Elevated liver function tests 09/07/2019  Osteoporosis 02/09/2017   History of vertebral fracture 02/09/2017   GERD  (gastroesophageal reflux disease) 03/11/2016   Hyperlipidemia 02/19/2015   Essential hypertension 03/18/2014   PCP:  Dettinger, Fonda LABOR, MD Pharmacy:   [facility] [place] [place], [place] - [address] [place] [address] Phone: [phone] Fax: [phone]  Social Drivers of Health (SDOH) Social History: SDOH Screenings   Food Insecurity: No Food Insecurity (10/16/2024)  Housing: Low Risk  (10/16/2024)  Transportation Needs: No Transportation Needs (10/16/2024)  Utilities: Not At Risk (10/16/2024)  Alcohol Screen: Low Risk  (12/27/2023)  Depression (PHQ2-9): Low Risk  (07/24/2024)  Recent Concern: Depression (PHQ2-9) - High Risk (07/06/2024)  Financial Resource Strain: Low Risk  (12/27/2023)  Physical Activity: Inactive (12/27/2023)  Social Connections: Moderately Isolated (10/16/2024)  Stress: No Stress Concern Present (12/27/2023)  Tobacco Use: Medium Risk (10/16/2024)  Health Literacy: Adequate Health Literacy (12/27/2023)   SDOH Interventions:    Readmission Risk Interventions    04/13/2024    9:01 AM  Readmission Risk Prevention Plan  Medication Screening Complete  Transportation Screening Complete

## 2024-10-17 NOTE — Progress Notes (Signed)
 PROGRESS NOTE    Jody Wilson  FMW:[id number] DOB: [birth year] DOA: 10/16/2024 PCP: Dettinger, Fonda LABOR, MD  Brief Narrative: This patient was admitted from [facility] nursing home on 10/16/2024 with complaints of confusion hallucination seeing her dead mother in the room.  She was recently admitted to the hospital on the 15th of this month after a fall and was found to have right humerus closed fracture right pubic ramus fracture Ortho recommended conservative management and she was discharged to skilled nursing facility on the 22nd.  Past medical history significant for anxiety depression GERD sinus bradycardia hypertension orthostatic hypotension iron deficiency anemia history of vertebral fracture.  At the time of admission she also complained of dysuria her UA came back negative for urinary tract infection. At the time of admission she was hypothermic with leukocytosis and normal lactate level. CT head showed no acute abnormalities. CT chest and abdomen and pelvis were done-. A cluster of nodular density in the right upper lobe, likely post inflammatory versus atypical pneumonia. Clinical correlation is recommended. Large hiatal hernia containing the majority of the stomach and a segment of the transverse colon. No bowel obstruction. Normal appendix.Displaced fractures of the right anterior and inferior pubic rami as well as insufficiency fractures of bilateral sacral ala. Normal TSH, normal ammonia  Assessment & Plan:   Principal Problem:   Community acquired pneumonia Active Problems:   Altered mental status, unspecified   Essential hypertension   Hyperlipidemia   GERD (gastroesophageal reflux disease)   Iron deficiency anemia   Anxiety and depression   Closed fracture of neck of left femur (HCC)   Closed fracture of right inferior pubic ramus (HCC)   History of bradycardia   Sepsis (HCC)   #1 acute metabolic encephalopathy in the setting of hypoxia, community-acquired  pneumonia.  At baseline her mental status she is able to have a conversation and follow conversation she is not confused she is able to do her ADLs but currently she is not able to do any of her ADLs by herself.  She continues to talk staff that does not make any sense.  She was found to be hypoxic in the ED and was placed on 2 L of oxygen.  Chest x-ray was concerning for consolidation. On Rocephin and azithromycin .  #2 Sepsis secondary to CAP on admission she was hypothermic tachycardic with leukocytosis and hypoxia.  She was initially placed on vancomycin  and cefepime.  With ongoing change in mental status febrile antibiotics were stopped and she was started on Rocephin and azithromycin .(Cefepime side effects neurotoxicity) MRSA PCR negative.  WBCs trending down.  #3 hypertension review of cardiology notes recommended to restart Coreg  and olmesartan .  Her blood pressure remains elevated.  #4 depression and anxiety continue Celexa  and Wellbutrin  Restart Xanax .  #5 history of recent fracture of the right pubic ramus, nondisplaced right sacral alar and closed fracture of the proximal right humerus Ortho recommended conservative treatment.  #6 hypokalemia replete potassium 3.0 Mag 1.8   Estimated body mass index is 23.63 kg/m as calculated from the following:   Height as of this encounter: 5' 3 (1.6 m).   Weight as of this encounter: 60.5 kg.  DVT prophylaxis: Lovenox  Code Status: Full code Family Communication: None  disposition Plan:  Status is: Inpatient Remains inpatient appropriate because: Acute illness   Consultants: none  Procedures: none Antimicrobials: rocephin and azithro  Subjective: She knows she is at [facility] long hospital but then she started to just say things that does not  make sense  Objective: Vitals:   10/17/24 0910 10/17/24 1018 10/17/24 1026 10/17/24 1100  BP: (!) 150/118 105/75 125/76 (!) 145/92  Pulse:  77 76 95  Resp: 18  (!) 26 (!) 25  Temp:   98.4 F  (36.9 C)   TempSrc:   Oral   SpO2:   97% 94%  Weight:      Height:        Intake/Output Summary (Last 24 hours) at 10/17/2024 1138 Last data filed at 10/17/2024 1000 Gross per 24 hour  Intake 1983.33 ml  Output 650 ml  Net 1333.33 ml   Filed Weights   10/16/24 2303  Weight: 60.5 kg    Examination:  General exam: Appears jittery and confused Respiratory system: Few rhonchi. Respiratory effort normal. Cardiovascular system: Tachycardic  gastrointestinal system: Abdomen is nondistended, soft and nontender. No organomegaly or masses felt. Normal bowel sounds heard. Central nervous system: Alert and oriented. No focal neurological deficits. Extremities: No edema.     Data Reviewed: I have personally reviewed following labs and imaging studies  CBC: Recent Labs  Lab 10/16/24 1616 10/17/24 0404  WBC 14.0* 11.9*  NEUTROABS 11.4*  --   HGB 12.2 12.0  HCT 36.7 37.6  MCV 97.1 100.5*  PLT 496* 513*   Basic Metabolic Panel: Recent Labs  Lab 10/16/24 1616 10/17/24 0404 10/17/24 0945  NA 134* 136  --   K 3.0* 3.0*  --   CL 97* 103  --   CO2 24 24  --   GLUCOSE 123* 94  --   BUN 12 10  --   CREATININE 0.62 0.53  --   CALCIUM  9.8 9.1  --   MG  --   --  1.8   GFR: Estimated Creatinine Clearance: 52.6 mL/min (by C-G formula based on SCr of 0.53 mg/dL). Liver Function Tests: Recent Labs  Lab 10/16/24 1616 10/17/24 0404  AST 20 20  ALT 13 13  ALKPHOS 182* 179*  BILITOT 0.9 0.8  PROT 7.2 6.9  ALBUMIN 3.8 3.5   No results for input(s): LIPASE, AMYLASE in the last 168 hours. Recent Labs  Lab 10/17/24 0404  AMMONIA 28   Coagulation Profile: No results for input(s): INR, PROTIME in the last 168 hours. Cardiac Enzymes: No results for input(s): CKTOTAL, CKMB, CKMBINDEX, TROPONINI in the last 168 hours. BNP (last 3 results) No results for input(s): PROBNP in the last 8760 hours. HbA1C: No results for input(s): HGBA1C in the last 72  hours. CBG: Recent Labs  Lab 10/16/24 1543 10/17/24 1117  GLUCAP 129* 119*   Lipid Profile: No results for input(s): CHOL, HDL, LDLCALC, TRIG, CHOLHDL, LDLDIRECT in the last 72 hours. Thyroid  Function Tests: Recent Labs    10/17/24 0404  TSH 1.720   Anemia Panel: No results for input(s): VITAMINB12, FOLATE, FERRITIN, TIBC, IRON, RETICCTPCT in the last 72 hours. Sepsis Labs: Recent Labs  Lab 10/16/24 1641 10/17/24 0404  PROCALCITON  --  0.10  LATICACIDVEN 1.1  --     Recent Results (from the past 240 hours)  Culture, blood (Routine X 2) w Reflex to ID Panel     Status: None (Preliminary result)   Collection Time: 10/16/24  4:14 PM   Specimen: BLOOD LEFT WRIST  Result Value Ref Range Status   Specimen Description   Final    BLOOD LEFT WRIST Performed at [facility], [address] [place], [place] [address]    Special Requests   Final  BOTTLES DRAWN AEROBIC AND ANAEROBIC Blood Culture adequate volume Performed at [facility], [address] [place], [place] [address]    Culture   Final    NO GROWTH < 12 HOURS Performed at [facility], [address] [place], [place] [address]    Report Status PENDING  Incomplete  Culture, blood (Routine X 2) w Reflex to ID Panel     Status: None (Preliminary result)   Collection Time: 10/16/24  4:16 PM   Specimen: BLOOD RIGHT ARM  Result Value Ref Range Status   Specimen Description   Final    BLOOD RIGHT ARM Performed at [facility], [address] [place], [place] [address]    Special Requests   Final    BOTTLES DRAWN AEROBIC AND ANAEROBIC Blood Culture results may not be optimal due to an inadequate volume of blood received in culture bottles Performed at [facility], [address] [place], [place] [address]    Culture   Final    NO GROWTH < 12 HOURS Performed at [facility], [address], [place], [place] [address]     Report Status PENDING  Incomplete  MRSA Next Gen by PCR, Nasal     Status: None   Collection Time: 10/17/24  1:40 AM   Specimen: Urine, Clean Catch; Nasal Swab  Result Value Ref Range Status   MRSA by PCR Next Gen NOT DETECTED NOT DETECTED Final    Comment: (NOTE) The GeneXpert MRSA Assay (FDA approved for NASAL specimens only), is one component of a comprehensive MRSA colonization surveillance program. It is not intended to diagnose MRSA infection nor to guide or monitor treatment for MRSA infections. Test performance is not FDA approved in patients less than 22 years old. Performed at [facility], [address] [place], [place] [address]   Respiratory (~20 pathogens) panel by PCR     Status: None   Collection Time: 10/17/24  2:00 AM   Specimen: Nasopharyngeal Swab; Respiratory  Result Value Ref Range Status   Adenovirus NOT DETECTED NOT DETECTED Final   Coronavirus 229E NOT DETECTED NOT DETECTED Final    Comment: (NOTE) The Coronavirus on the Respiratory Panel, DOES NOT test for the novel  Coronavirus (2019 nCoV)    Coronavirus HKU1 NOT DETECTED NOT DETECTED Final   Coronavirus NL63 NOT DETECTED NOT DETECTED Final   Coronavirus OC43 NOT DETECTED NOT DETECTED Final   Metapneumovirus NOT DETECTED NOT DETECTED Final   Rhinovirus / Enterovirus NOT DETECTED NOT DETECTED Final   Influenza A NOT DETECTED NOT DETECTED Final   Influenza B NOT DETECTED NOT DETECTED Final   Parainfluenza Virus 1 NOT DETECTED NOT DETECTED Final   Parainfluenza Virus 2 NOT DETECTED NOT DETECTED Final   Parainfluenza Virus 3 NOT DETECTED NOT DETECTED Final   Parainfluenza Virus 4 NOT DETECTED NOT DETECTED Final   Respiratory Syncytial Virus NOT DETECTED NOT DETECTED Final   Bordetella pertussis NOT DETECTED NOT DETECTED Final   Bordetella Parapertussis NOT DETECTED NOT DETECTED Final   Chlamydophila pneumoniae NOT DETECTED NOT DETECTED Final   Mycoplasma pneumoniae NOT DETECTED NOT  DETECTED Final    Comment: Performed at [facility], [address], [place], [place] [address]         Radiology Studies: CT CHEST ABDOMEN PELVIS W CONTRAST Result Date: 10/16/2024 CLINICAL DATA:  Sepsis. EXAM: CT CHEST, ABDOMEN, AND PELVIS WITH CONTRAST TECHNIQUE: Multidetector CT imaging of the chest,  abdomen and pelvis was performed following the standard protocol during bolus administration of intravenous contrast. RADIATION DOSE REDUCTION: This exam was performed according to the departmental dose-optimization program which includes automated exposure control, adjustment of the mA and/or kV according to patient size and/or use of iterative reconstruction technique. CONTRAST:  OMNIPAQUE  IOHEXOL  300 MG/ML  SOLN COMPARISON:  Chest radiograph dated 10/16/2024 and pelvic CT dated 10/03/2024. FINDINGS: CT CHEST FINDINGS Cardiovascular: There is no cardiomegaly or pericardial effusion. There is coronary vascular calcification. Moderate calcified and noncalcified plaque of the thoracic aorta. No aneurysmal dilatation or dissection. The origins of the great vessels of the aortic arch and the central pulmonary arteries are patent. Mediastinum/Nodes: No hilar or mediastinal adenopathy. There is a large hiatal hernia containing the majority of the stomach and a segment of the transverse colon. The esophagus is grossly unremarkable. No mediastinal fluid collection. Lungs/Pleura: Background of emphysema. There is partial compressive atelectasis of the lower lobes secondary to a large hiatal hernia. Faint cluster of nodular density in the right upper lobe likely post inflammatory or atypical infection. No consolidative changes. There is no pleural effusion pneumothorax. The central airways are patent. Musculoskeletal: No acute osseous pathology. Osteopenia with degenerative changes of the spine. Old sternal fracture. T12 vertebroplasty. CT ABDOMEN PELVIS FINDINGS No intra-abdominal free air or free  fluid. Hepatobiliary: The liver is unremarkable. There is mild dilatation, post cholecystectomy. No retained calcified stone noted in the central CBD. Pancreas: Unremarkable. No pancreatic ductal dilatation or surrounding inflammatory changes. Spleen: Normal in size without focal abnormality. Adrenals/Urinary Tract: The adrenal glands unremarkable. There is no hydronephrosis on either side. There is symmetric enhancement and excretion of contrast by both kidneys. The visualized ureters appear unremarkable the urinary bladder is poorly visualized due to streak artifact caused by bilateral total hip arthroplasties. Stomach/Bowel: There is no bowel obstruction or active inflammation. The appendix is normal. Vascular/Lymphatic: Advanced aortoiliac atherosclerotic disease. The IVC is unremarkable. No portal venous gas. There is no adenopathy. Reproductive: Evaluation of the pelvic structures is very limited due to streak artifact caused by bilateral hip arthroplasties. Other: None Musculoskeletal: Bilateral total hip arthroplasties. Displaced fractures of the right anterior and inferior pubic rami as well as insufficiency fractures of bilateral sacral ala. There is osteopenia with degenerative changes of the spine. IMPRESSION: 1. A cluster of nodular density in the right upper lobe, likely post inflammatory versus atypical pneumonia. Clinical correlation is recommended. 2. Large hiatal hernia containing the majority of the stomach and a segment of the transverse colon. No bowel obstruction. Normal appendix. 3. Displaced fractures of the right anterior and inferior pubic rami as well as insufficiency fractures of bilateral sacral ala. 4.  Aortic Atherosclerosis (ICD10-I70.0). Electronically Signed   By: Vanetta Chou M.D.   On: 10/16/2024 19:35   CT Head Wo Contrast Result Date: 10/16/2024 EXAM: CT HEAD WITHOUT CONTRAST 10/16/2024 07:08:51 PM TECHNIQUE: CT of the head was performed without the administration of  intravenous contrast. Automated exposure control, iterative reconstruction, and/or weight based adjustment of the mA/kV was utilized to reduce the radiation dose to as low as reasonably achievable. COMPARISON: 06/26/2024 CLINICAL HISTORY: Mental status change, unknown cause. Pt BIB [facility] from [facility] with reports of AMS since Saturday morning. Pt has been more confused than normal. Pt has had recent fractures, oriented to herself. Pt reports burning with urination. Right clavicle fx. FINDINGS: BRAIN AND VENTRICLES: No acute hemorrhage. No evidence of acute infarct. No hydrocephalus. No extra-axial collection. No mass effect  or midline shift. Calcific atherosclerosis. ORBITS: No acute abnormality. SINUSES: Left sphenoid sinus mucosal thickening. SOFT TISSUES AND SKULL: No acute soft tissue abnormality. No skull fracture. LIMITATIONS/ARTIFACTS: Motion limited study. IMPRESSION: 1. Motion degraded exam. 2. No acute intracranial abnormality. 3. Chronic left sphenoid sinus disease. Electronically signed by: Morene Hoard MD 10/16/2024 07:31 PM EDT RP Workstation: [id number]   DG Chest Port 1 View Result Date: 10/16/2024 EXAM: 1 VIEW(S) XRAY OF THE CHEST 10/16/2024 03:29:00 PM COMPARISON: 10/03/2024 CLINICAL HISTORY: [id number] AMS (altered mental status) [id number]. Per chart: Pt BIB [facility] from [facility] with reports of AMS since Saturday morning. Pt has been more confused than normal. Pt has had recent fractures, oriented to herself. Pt reports burning with urination. Right clavicle fx. 2L [place] at ; baseline. FINDINGS: LUNGS AND PLEURA: No focal pulmonary opacity. No pulmonary edema. No pleural effusion. No pneumothorax. HEART AND MEDIASTINUM: No acute abnormality of the cardiac and mediastinal silhouettes. Large hiatal hernia is again noted. BONES AND SOFT TISSUES: No acute osseous abnormality. IMPRESSION: 1. No acute cardiopulmonary abnormality. 2. Large hiatal hernia. Electronically  signed by: Lynwood Seip MD 10/16/2024 03:51 PM EDT RP Workstation: [id number]    Scheduled Meds:  aspirin  EC  81 mg Oral Daily   buPROPion   150 mg Oral Daily   carvedilol   3.125 mg Oral BID WC   citalopram   40 mg Oral Daily   docusate sodium   100 mg Oral BID   enoxaparin  (LOVENOX ) injection  40 mg Subcutaneous Q24H   irbesartan   150 mg Oral Daily   pantoprazole   40 mg Oral BID AC   pravastatin   40 mg Oral QPM   sodium chloride  flush  3 mL Intravenous Q12H   Continuous Infusions:  sodium chloride      sodium chloride      azithromycin      cefTRIAXone (ROCEPHIN)  IV     lactated ringers  100 mL/hr at 10/17/24 0918     LOS: 1 day    Jody KANDICE Hoots, MD  10/17/2024, 11:38 AM

## 2024-10-17 NOTE — Progress Notes (Addendum)
 PT Cancellation Note  Patient Details Name: Jody Wilson MRN: [id number] DOB: 11/22/1951   Cancelled Treatment:    Reason Eval/Treat Not Completed:  Order received. Chart reviewed. RN recommends (via secure chat) PT be held on today. Will check back another day.    Dannial SQUIBB, PT Acute Rehabilitation  Office: [phone]

## 2024-10-17 NOTE — Progress Notes (Deleted)
 Yellow MEWS implimented. New orders received from MD. 5mg  IV metoprolol already given + 0.5 mg of IV dilaudid  to control pain. For this reason BP/HR improved. RR still slightly elevated, monitoring closely. O2 stable on 2L [place]. Pt still actively hallucinating although can answer orientation questions when asked.    10/17/24 1026  Assess: MEWS Score  Temp 98.4 F (36.9 C)  BP 125/76  MAP (mmHg) 85  Pulse Rate 76  Resp (!) 26  Level of Consciousness Alert  SpO2 97 %  O2 Device Nasal Cannula  O2 Flow Rate (L/min) 2 L/min  Assess: MEWS Score  MEWS Temp 0  MEWS Systolic 0  MEWS Pulse 0  MEWS RR 2  MEWS LOC 0  MEWS Score 2  MEWS Score Color Yellow  Assess: if the MEWS score is Yellow or Red  Were vital signs accurate and taken at a resting state? Yes  Does the patient meet 2 or more of the SIRS criteria? No  MEWS guidelines implemented  Yes, yellow  Treat  MEWS Interventions Considered administering scheduled or prn medications/treatments as ordered  Take Vital Signs  Increase Vital Sign Frequency  Yellow: Q2hr x1, continue Q4hrs until patient remains green for 12hrs  Escalate  MEWS: Escalate Yellow: Discuss with charge nurse and consider notifying provider and/or RRT  Notify: Charge Nurse/RN  Name of Charge Nurse/RN Notified [place], RN  Provider Notification  Provider Name/Title Almarie Hoots  Date Provider Notified 10/17/24  Time Provider Notified 1030  Method of Notification  (secure chat message)  Notification Reason Change in status;Other (Comment) (yellow mews, inc HR and RR)  Provider response See new orders;Evaluate remotely  Date of Provider Response 10/17/24  Time of Provider Response 1035  Assess: SIRS CRITERIA  SIRS Temperature  0  SIRS Respirations  1  SIRS Pulse 0  SIRS WBC 0  SIRS Score Sum  1

## 2024-10-17 NOTE — Consult Note (Addendum)
 NAME:  Jody Wilson, MRN:  [id number], DOB:  07-25-1951, LOS: 1 ADMISSION DATE:  10/16/2024, CONSULTATION DATE:  10/17/2024 REFERRING MD: TRH, CHIEF COMPLAINT: AMS  History of Present Illness:  Jody Wilson is a 73 year old female with past medical history significant for HTN, HLD, bradycardia, iron deficiency anemia, pulmonary embolism, CAD, and anxiety who presented to the ED at [facility] 10/28 from local SNF for complaints of altered mental status with visual hallucinations.  Of note patient was recently admitted at this facility 10/15-10/22 after suffering mechanical fall resulting in right closed humerus and right pubic ramus fracture managed conservatively.  On ED arrival patient was seen mildly hypothermic, tachypneic, tachycardic, and slightly hypertensive.  Was also observed to be quite altered.  Lab work on admission significant for NA 134, K3.0, CL 97, glucose 123, alkaline phosphatase 182, WBC 14.0, platelet 496, neutrophils 11.4, lactic acid within normal at 1.1.  UA significant for few bacteria but negative nitrates and leukocytes.  Imaging including head, chest, abdomen, pelvis relatively negative for any acute findings admission. CT scan reviewed by me: Very minute nodularity in right upper lobe less likely to be pneumonia.  Afternoon 10/29 PCCM consulted for the assistance in working up worsening altered mental status.  Vital signs remained similar to admission with tachypnea, tachycardia, and hypertension seen.  On evaluating the patient she is having hyperactive delirium.  She is intermittently mouthing and saying words.  She is not aware of her surroundings.  She is having intermittent myoclonic jerks of her arms and legs, not seizure-like activity. I spoke to the son.  He said that she was acting confused since Saturday.  She was finally brought to the emergency room yesterday.  She has not been out of the bed at the nursing home because of her fractures.  She was  intermittently getting pain meds over there.  Pertinent  Medical History  HTN, HLD, bradycardia, iron deficiency anemia, pulmonary embolism, CAD, and anxiety  Significant Hospital Events: Including procedures, antibiotic start and stop dates in addition to other pertinent events   10/28 presented from local SNF with altered mental status, workup on admission concerning for sepsis secondary to CAP  Interim History / Subjective:  As above   Objective    Blood pressure (!) 116/103, pulse (!) 130, temperature 98 F (36.7 C), temperature source Axillary, resp. rate (!) 26, height 5' 3 (1.6 m), weight 60.5 kg, SpO2 92%.        Intake/Output Summary (Last 24 hours) at 10/17/2024 1602 Last data filed at 10/17/2024 1200 Gross per 24 hour  Intake 2103.33 ml  Output 650 ml  Net 1453.33 ml   Filed Weights   10/16/24 2303  Weight: 60.5 kg    Examination: General: Elderly lady not oriented to the surrounding. Lungs: clear to auscultation bilaterally.  Heart: regular rate rhythm, no murmur appreciated.  Abdomen: non tender, non distended. Normal BS.  Neuro: Saying stop when given painful stimuli.  Moving all of the extremities.  Having intermittent jerks.  Appears to be in hyperactive delirium.  ?  Upper extremity rigidity.   Resolved problem list   Assessment and Plan  Hyperactive delirium: Possible PNA: Acute hypoxic resp failure: - Delirium likely combination of being in the hospital and nursing home in the same bed along with use of opiates. - Rule out NMS/serotonin syndrome.  Most likely hyperactive delirium. - CPK ordered. - Continue treatment for pneumonia, procalcitonin 0.1.  Finish 5-day course.  On ceftriaxone and azithromycin .  Infectious workup negative so far. - Modify pain regimen with discontinuing opiates if possible.  Multimodal pain regimen with scheduled Tylenol .  IV Tylenol  ordered. - IV Dilaudid  and p.o. Oxy discontinued.  Can give as needed doses of IV  Dilaudid  if severe pain. - spo2 goal >90%.  - if CPK is normal will schedule nighttime seroquel 50mg . Qtc 447.   HTN: - continue coreg  and ARB.   Depression/Anxiety: - Takes Celexa  and as needed Ativan  at home.  In addition is on Wellbutrin . - Stop Ativan .  Very low-dose.  Can exacerbate delirium. - Rule out serotonin syndrome as above.   mechanical fall: Fracture right pubic rami, right sacral ala, proximal right humerus: - Ortho following.  Conservative care.   Labs   CBC: Recent Labs  Lab 10/16/24 1616 10/17/24 0404  WBC 14.0* 11.9*  NEUTROABS 11.4*  --   HGB 12.2 12.0  HCT 36.7 37.6  MCV 97.1 100.5*  PLT 496* 513*    Basic Metabolic Panel: Recent Labs  Lab 10/16/24 1616 10/17/24 0404 10/17/24 0945  NA 134* 136  --   K 3.0* 3.0*  --   CL 97* 103  --   CO2 24 24  --   GLUCOSE 123* 94  --   BUN 12 10  --   CREATININE 0.62 0.53  --   CALCIUM  9.8 9.1  --   MG  --   --  1.8   GFR: Estimated Creatinine Clearance: 52.6 mL/min (by C-G formula based on SCr of 0.53 mg/dL). Recent Labs  Lab 10/16/24 1616 10/16/24 1641 10/17/24 0404  PROCALCITON  --   --  0.10  WBC 14.0*  --  11.9*  LATICACIDVEN  --  1.1  --     Liver Function Tests: Recent Labs  Lab 10/16/24 1616 10/17/24 0404  AST 20 20  ALT 13 13  ALKPHOS 182* 179*  BILITOT 0.9 0.8  PROT 7.2 6.9  ALBUMIN 3.8 3.5   No results for input(s): LIPASE, AMYLASE in the last 168 hours. Recent Labs  Lab 10/17/24 0404  AMMONIA 28    ABG No results found for: PHART, PCO2ART, PO2ART, HCO3, TCO2, ACIDBASEDEF, O2SAT   Coagulation Profile: No results for input(s): INR, PROTIME in the last 168 hours.  Cardiac Enzymes: No results for input(s): CKTOTAL, CKMB, CKMBINDEX, TROPONINI in the last 168 hours.  HbA1C: No results found for: HGBA1C  CBG: Recent Labs  Lab 10/16/24 1543 10/17/24 1117  GLUCAP 129* 119*    Past Medical History:  She,  has a past medical  history of Anemia, Anxiety, Aortic atherosclerosis, B12 deficiency (09/25/2019), Carotid artery disease, Depression, GERD (gastroesophageal reflux disease), Hyperlipidemia, Hypertension, Orthostatic hypotension, Osteoporosis, and Pulmonary nodule (02/09/2023).   Surgical History:   Past Surgical History:  Procedure Laterality Date   ABDOMINAL HYSTERECTOMY     BACK SURGERY     CHOLECYSTECTOMY     FOOT SURGERY     7 in the past   HIP ARTHROPLASTY Right 02/11/2023   Procedure: ARTHROPLASTY BIPOLAR HIP (HEMIARTHROPLASTY);  Surgeon: Onesimo Oneil LABOR, MD;  Location: [facility];  Service: Orthopedics;  Laterality: Right;   HIP ARTHROPLASTY Left 04/13/2024   Procedure: HEMIARTHROPLASTY (BIPOLAR) HIP, DIRECT LATERAL  FOR FRACTURE;  Surgeon: Margrette Taft BRAVO, MD;  Location: [facility];  Service: Orthopedics;  Laterality: Left;   IR GENERIC HISTORICAL  12/09/2016   IR RADIOLOGIST EVAL & MGMT 12/09/2016 [facility]   IR GENERIC HISTORICAL  12/10/2016   IR VERTEBROPLASTY CERV/THOR BX INC  UNI/BIL INC/INJECT/IMAGING 12/10/2016 [facility]   LEFT HEART CATH AND CORONARY ANGIOGRAPHY N/A 09/30/2020   Procedure: LEFT HEART CATH AND CORONARY ANGIOGRAPHY;  Surgeon: Elmira Newman PARAS, MD;  Location: [facility];  Service: Cardiovascular;  Laterality: N/A;   LEFT HEART CATH AND CORONARY ANGIOGRAPHY N/A 06/27/2024   Procedure: LEFT HEART CATH AND CORONARY ANGIOGRAPHY;  Surgeon: Verlin Lonni BIRCH, MD;  Location: [facility];  Service: Cardiovascular;  Laterality: N/A;   TRACHEOSTOMY TUBE PLACEMENT  1982     Social History:   reports that she quit smoking about 19 months ago. Her smoking use included cigarettes. She started smoking about 26 years ago. She has a 6.3 pack-year smoking history. She has never been exposed to tobacco smoke. She has never used smokeless tobacco. She reports that she does not currently use alcohol. She reports that she does not use drugs.   Family History:  Her family  history includes Aneurysm in her paternal uncle; Atrial fibrillation in her half-sister; Breast cancer in her niece; Congestive Heart Failure in her mother; Diabetes in her mother; Heart attack in her maternal grandfather and maternal grandmother; Heart disease in her mother; Hyperlipidemia in her mother; Hypertension in her mother and son; Liver disease (age of onset: 62) in her son; Lupus in her half-sister; Other (age of onset: 46) in her son.   Allergies Allergies  Allergen Reactions   Tetracyclines & Related Itching and Swelling     Home Medications  Prior to Admission medications   Medication Sig Start Date End Date Taking? Authorizing Provider  ALPRAZolam  (XANAX ) 0.25 MG tablet Take 0.25 mg by mouth 2 (two) times daily as needed for anxiety.   Yes [provider]  aspirin  EC 81 MG tablet TAKE 1 PO BID THRU 05/13/24 THEN RESUME 1 PO DAILY Patient taking differently: Take 81 mg by mouth daily. 04/16/24  Yes Johnson, Clanford L, MD  buPROPion  (WELLBUTRIN  XL) 150 MG 24 hr tablet Take 1 tablet (150 mg total) by mouth daily. 06/04/24  Yes Dettinger, Fonda LABOR, MD  citalopram  (CELEXA ) 40 MG tablet Take 1 tablet (40 mg total) by mouth daily. 06/04/24  Yes Dettinger, Fonda LABOR, MD  cyclobenzaprine  (FLEXERIL ) 10 MG tablet Take 10 mg by mouth in the morning and at bedtime.   Yes [provider]  docusate sodium  (COLACE) 100 MG capsule Take 1 capsule (100 mg total) by mouth 2 (two) times daily. 04/16/24  Yes Johnson, Clanford L, MD  ondansetron  (ZOFRAN ) 4 MG tablet Take 1 tablet (4 mg total) by mouth every 8 (eight) hours as needed for nausea or vomiting. 05/08/24  Yes Francesca Elsie CROME, MD  oxyCODONE  (OXY IR/ROXICODONE ) 5 MG immediate release tablet Take 1 tablet (5 mg total) by mouth every 4 (four) hours as needed for moderate pain (pain score 4-6) or breakthrough pain. 10/10/24  Yes Odell Celinda Balo, MD  polyethylene glycol (MIRALAX  / GLYCOLAX ) 17 g packet Take 17 g by mouth daily  as needed for mild constipation. 04/16/24  Yes Johnson, Clanford L, MD  pravastatin  (PRAVACHOL ) 40 MG tablet Take 1 tablet (40 mg total) by mouth every evening. Patient taking differently: Take 40 mg by mouth daily. 06/04/24  Yes Dettinger, Fonda LABOR, MD  ALPRAZolam  (XANAX ) 0.5 MG tablet Take 0.5 tablets (0.25 mg total) by mouth 2 (two) times daily as needed for anxiety. Patient not taking: Reported on 10/17/2024 10/10/24   Odell Celinda Balo, MD  ergocalciferol  (VITAMIN D2) 1.25 MG (50000 UT) capsule Take 1  capsule (50,000 Units total) by mouth every 14 (fourteen) days. Patient not taking: Reported on 10/17/2024 07/10/24   Geofm Delon BRAVO, NP  pantoprazole  (PROTONIX ) 40 MG tablet Take 1 tablet (40 mg total) by mouth 2 (two) times daily before a meal. Patient not taking: Reported on 10/17/2024 06/04/24   Dettinger, Fonda LABOR, MD   I personally spent a total of 80 minutes in the care of the patient today including preparing to see the patient, getting/reviewing separately obtained history, performing a medically appropriate exam/evaluation, counseling and educating, placing orders, referring and communicating with other health care professionals, documenting clinical information in the EHR, and independently interpreting results.   Sammi JONETTA Fredericks, MD Pulmonary, Critical Care and Sleep Attending.  Pager: [phone]  10/17/2024, 4:26 PM

## 2024-10-17 NOTE — Progress Notes (Signed)
 Patient given 5 mg IV metoprolol, IV dilaudid , and repositioned. Was complaining of severe pain in pelvis/buttocks and had peed in the bed. Seems much more comfortable now. HR/BP have improved. RR still slightly elevated, MD Will aware. Monitoring closely.    10/17/24 0800  Assess: MEWS Score  Temp 98.7 F (37.1 C)  BP (!) 167/113  MAP (mmHg) 129  Pulse Rate (!) 120  ECG Heart Rate (!) 119  Resp 20  Level of Consciousness Alert  SpO2 93 %  O2 Device Nasal Cannula  Patient Activity (if Appropriate) In bed  O2 Flow Rate (L/min) 3 L/min  Assess: MEWS Score  MEWS Temp 0  MEWS Systolic 0  MEWS Pulse 2  MEWS RR 0  MEWS LOC 0  MEWS Score 2  MEWS Score Color Yellow  Assess: if the MEWS score is Yellow or Red  Were vital signs accurate and taken at a resting state? Yes  Does the patient meet 2 or more of the SIRS criteria? No  MEWS guidelines implemented  Yes, yellow  Treat  MEWS Interventions Considered administering scheduled or prn medications/treatments as ordered  Take Vital Signs  Increase Vital Sign Frequency  Yellow: Q2hr x1, continue Q4hrs until patient remains green for 12hrs  Escalate  MEWS: Escalate Yellow: Discuss with charge nurse and consider notifying provider and/or RRT  Notify: Charge Nurse/RN  Name of Charge Nurse/RN Notified Administrator, Civil Service  Provider Notification  Provider Name/Title Almarie Will, MD  Date Provider Notified 10/17/24  Time Provider Notified 0800  Method of Notification  (secure chat)  Notification Reason Change in status  Provider response See new orders;Evaluate remotely  Assess: SIRS CRITERIA  SIRS Temperature  0  SIRS Respirations  0  SIRS Pulse 1  SIRS WBC 0  SIRS Score Sum  1

## 2024-10-18 ENCOUNTER — Inpatient Hospital Stay (HOSPITAL_COMMUNITY)

## 2024-10-18 ENCOUNTER — Inpatient Hospital Stay (HOSPITAL_COMMUNITY)
Admit: 2024-10-18 | Discharge: 2024-10-18 | Disposition: A | Discharge status: Home / Self Care | Attending: Internal Medicine | Admitting: Internal Medicine

## 2024-10-18 DIAGNOSIS — R569 Unspecified convulsions: Secondary | ICD-10-CM

## 2024-10-18 DIAGNOSIS — J189 Pneumonia, unspecified organism: Secondary | ICD-10-CM | POA: Diagnosis not present

## 2024-10-18 DIAGNOSIS — A419 Sepsis, unspecified organism: Secondary | ICD-10-CM | POA: Diagnosis not present

## 2024-10-18 DIAGNOSIS — F05 Delirium due to known physiological condition: Secondary | ICD-10-CM | POA: Diagnosis not present

## 2024-10-18 DIAGNOSIS — F32A Depression, unspecified: Secondary | ICD-10-CM | POA: Diagnosis not present

## 2024-10-18 DIAGNOSIS — R4182 Altered mental status, unspecified: Secondary | ICD-10-CM

## 2024-10-18 DIAGNOSIS — E860 Dehydration: Secondary | ICD-10-CM | POA: Diagnosis not present

## 2024-10-18 DIAGNOSIS — J9601 Acute respiratory failure with hypoxia: Secondary | ICD-10-CM | POA: Diagnosis not present

## 2024-10-18 DIAGNOSIS — G9341 Metabolic encephalopathy: Secondary | ICD-10-CM | POA: Diagnosis not present

## 2024-10-18 DIAGNOSIS — I10 Essential (primary) hypertension: Secondary | ICD-10-CM | POA: Diagnosis not present

## 2024-10-18 LAB — CBC
HCT: 36.4 % (ref 36.0–46.0)
Hemoglobin: 11.8 g/dL — ABNORMAL LOW (ref 12.0–15.0)
MCH: 32.2 pg (ref 26.0–34.0)
MCHC: 32.4 g/dL (ref 30.0–36.0)
MCV: 99.5 fL (ref 80.0–100.0)
Platelets: 634 K/uL — ABNORMAL HIGH (ref 150–400)
RBC: 3.66 MIL/uL — ABNORMAL LOW (ref 3.87–5.11)
RDW: 13.4 % (ref 11.5–15.5)
WBC: 10.7 K/uL — ABNORMAL HIGH (ref 4.0–10.5)
nRBC: 0 % (ref 0.0–0.2)

## 2024-10-18 LAB — COMPREHENSIVE METABOLIC PANEL WITH GFR
ALT: 14 U/L (ref 0–44)
AST: 18 U/L (ref 15–41)
Albumin: 3.6 g/dL (ref 3.5–5.0)
Alkaline Phosphatase: 195 U/L — ABNORMAL HIGH (ref 38–126)
Anion gap: 19 — ABNORMAL HIGH (ref 5–15)
BUN: 16 mg/dL (ref 8–23)
CO2: 16 mmol/L — ABNORMAL LOW (ref 22–32)
Calcium: 9.6 mg/dL (ref 8.9–10.3)
Chloride: 104 mmol/L (ref 98–111)
Creatinine, Ser: 0.53 mg/dL (ref 0.44–1.00)
GFR, Estimated: >60 mL/min (ref >60)
Glucose, Bld: 93 mg/dL (ref 70–99)
Potassium: 3.6 mmol/L (ref 3.5–5.1)
Sodium: 139 mmol/L (ref 135–145)
Total Bilirubin: 0.7 mg/dL (ref 0.0–1.2)
Total Protein: 6.9 g/dL (ref 6.5–8.1)

## 2024-10-18 LAB — LEGIONELLA PNEUMOPHILA SEROGP 1 UR AG: L. pneumophila Serogp 1 Ur Ag: NEGATIVE

## 2024-10-18 LAB — MAGNESIUM: Magnesium: 1.9 mg/dL (ref 1.7–2.4)

## 2024-10-18 MED ORDER — KETOROLAC TROMETHAMINE 15 MG/ML IJ SOLN
15.0000 mg | Freq: Three times a day (TID) | INTRAMUSCULAR | Status: AC | PRN
  Administered 2024-10-18 – 2024-10-19 (×4): 15 mg via INTRAVENOUS
  Filled 2024-10-18 (×4): qty 1

## 2024-10-18 MED ORDER — GADOBUTROL 1 MMOL/ML IV SOLN
6.0000 mL | Freq: Once | INTRAVENOUS | Status: AC | PRN
  Administered 2024-10-18: 6 mL via INTRAVENOUS

## 2024-10-18 MED ORDER — TRAMADOL HCL 50 MG PO TABS
50.0000 mg | ORAL_TABLET | Freq: Once | ORAL | Status: AC
  Administered 2024-10-18: 50 mg via ORAL
  Filled 2024-10-18: qty 1

## 2024-10-18 MED ORDER — MAGNESIUM SULFATE IN D5W 1-5 GM/100ML-% IV SOLN
1.0000 g | Freq: Once | INTRAVENOUS | Status: AC
  Administered 2024-10-18: 1 g via INTRAVENOUS
  Filled 2024-10-18: qty 100

## 2024-10-18 MED ORDER — POTASSIUM CHLORIDE CRYS ER 20 MEQ PO TBCR
40.0000 meq | EXTENDED_RELEASE_TABLET | Freq: Once | ORAL | Status: AC
  Administered 2024-10-18: 40 meq via ORAL
  Filled 2024-10-18: qty 2

## 2024-10-18 NOTE — Progress Notes (Signed)
 EEG complete - results pending

## 2024-10-18 NOTE — Evaluation (Signed)
 Physical Therapy Evaluation Patient Details Name: Jody Wilson MRN: [id number] DOB: [birth year] Today's Date: 10/18/2024  History of Present Illness  Pt is 73 yo female admitted on 10/16/24 from SNF with c/o confusion/hallucination.  Admitted with acute metabolic encephalopathy and sepsis in setting of hypoxia and CAP. Pt with admission on 10/15 with fall and R humerus fx and R pubic ramus fx - conservative management at that time but pt does report to have f/u about R UE surgery.  Last admission was NWB R UE in sling and WBAT R LE. Other hx includes but not limited to HTN, HLD, bradycardia, iron deficiency anemia, pulmonary embolism, CAD, and anxiety  Clinical Impression  Pt admitted with above diagnosis. At baseline, pt independent but after recent fall with pelvic and humerus fx was at SNF working on transfers.  Today, pt requiring mod A x 2 for multiple transfers. Unable to tolerate sitting in chair due to burning buttock pain.  Increased time for session for ADL management and multiple transfers.  Pt currently with functional limitations due to the deficits listed below (see PT Problem List). Pt will benefit from acute skilled PT to increase their independence and safety with mobility to allow discharge.  Recommend ongoing Patient will benefit from continued inpatient follow up therapy, <3 hours/day at d/c.          If plan is discharge home, recommend the following: Assist for transportation;Help with stairs or ramp for entrance;Two people to help with walking and/or transfers;Two people to help with bathing/dressing/bathroom   Can travel by private vehicle   No    Equipment Recommendations None recommended by PT  Recommendations for Other Services       Functional Status Assessment Patient has had a recent decline in their functional status and demonstrates the ability to make significant improvements in function in a reasonable and predictable amount of time.     Precautions /  Restrictions Precautions Precautions: Fall Required Braces or Orthoses: Sling Restrictions Weight Bearing Restrictions Per Provider Order: Yes RUE Weight Bearing Per Provider Order: Non weight bearing RLE Weight Bearing Per Provider Order: Weight bearing as tolerated      Mobility  Bed Mobility Overal bed mobility: Needs Assistance Bed Mobility: Supine to Sit, Sit to Supine     Supine to sit: Mod assist, +2 for physical assistance Sit to supine: Mod assist, +2 for physical assistance        Transfers Overall transfer level: Needs assistance Equipment used: Rolling walker (2 wheels) Transfers: Sit to/from Stand, Bed to chair/wheelchair/BSC Sit to Stand: Min assist, Mod assist, +2 physical assistance Stand pivot transfers: Mod assist, +2 safety/equipment         General transfer comment: Performed STS x 5 during session; pivoted to chair but pain increased despite pillows and repositioning so used BSC then back to bed    Ambulation/Gait               General Gait Details: unable d/t pain  Stairs            Wheelchair Mobility     Tilt Bed    Modified Rankin (Stroke Patients Only)       Balance Overall balance assessment: Needs assistance Sitting-balance support: Single extremity supported, Feet supported Sitting balance-Leahy Scale: Poor Sitting balance - Comments: Needs UE support   Standing balance support: During functional activity, Single extremity supported Standing balance-Leahy Scale: Poor Standing balance comment: Reliant on therapist support  Pertinent Vitals/Pain Pain Assessment Pain Assessment: Faces Pain Score: 9  Pain Location: buttock Pain Descriptors / Indicators: Burning Pain Intervention(s): Limited activity within patient's tolerance, Monitored during session, Repositioned, Other (comment) (Upon sitting reports buttock burning, tried repositioning with pillows and reclined in chair  but pain increasing, returned to bed and symptoms improved; also applied barrier cream to bottom)    Home Living Family/patient expects to be discharged to:: Skilled nursing facility   Available Help at Discharge: Family;Available PRN/intermittently Type of Home: House Home Access: Stairs to enter Entrance Stairs-Rails: None Entrance Stairs-Number of Steps: 1   Home Layout: One level Home Equipment: Agricultural Consultant (2 wheels);Hand held shower head;Adaptive equipment      Prior Function Prior Level of Function : Independent/Modified Independent;Driving             Mobility Comments: Community ambulator without AD - prior to recent admission, after hospitalization went to [facility] 10/10/24 and was working on transfers with therapy ADLs Comments: independent adls and iadls     Extremity/Trunk Assessment   Upper Extremity Assessment Upper Extremity Assessment: RUE deficits/detail;LUE deficits/detail RUE Deficits / Details: Pt with R UE humeral fx last admission, at that time was in sling but reports sling at SNF. Called ortho tech for sling.  Pt guarding UE during session. LUE Deficits / Details: generalized weakness    Lower Extremity Assessment Lower Extremity Assessment: LLE deficits/detail;RLE deficits/detail RLE Deficits / Details: AAROM limited by pain, strength 2/5, further MMT limited by pain LLE Deficits / Details: AAROM WFL, strength at least  3/5, further MMT limited by pain    Cervical / Trunk Assessment Cervical / Trunk Assessment: Kyphotic  Communication   Communication Communication: No apparent difficulties    Cognition Arousal: Alert Behavior During Therapy: WFL for tasks assessed/performed   PT - Cognitive impairments: Memory, Orientation                       PT - Cognition Comments: Still with some confusion with date/time but is realizing she was confused and asking when she came in and what day it is now Following commands: Intact        Cueing       General Comments      Exercises     Assessment/Plan    PT Assessment Patient needs continued PT services  PT Problem List Decreased strength;Decreased balance;Decreased activity tolerance;Decreased mobility;Decreased knowledge of use of DME;Pain;Cardiopulmonary status limiting activity       PT Treatment Interventions DME instruction;Therapeutic exercise;Gait training;Functional mobility training;Therapeutic activities;Patient/family education;Balance training    PT Goals (Current goals can be found in the Care Plan section)  Acute Rehab PT Goals Patient Stated Goal: have less pain PT Goal Formulation: With patient Time For Goal Achievement: 11/02/24 Potential to Achieve Goals: Good    Frequency Min 2X/week     Co-evaluation               AM-PAC PT 6 Clicks Mobility  Outcome Measure Help needed turning from your back to your side while in a flat bed without using bedrails?: A Lot Help needed moving from lying on your back to sitting on the side of a flat bed without using bedrails?: A Lot Help needed moving to and from a bed to a chair (including a wheelchair)?: Total Help needed standing up from a chair using your arms (e.g., wheelchair or bedside chair)?: Total Help needed to walk in hospital room?: Total Help needed climbing 3-5 steps with  a railing? : Total 6 Click Score: 8    End of Session Equipment Utilized During Treatment: Gait belt;Other (comment) (called ortho tech for new sling -stabilized arm during session) Activity Tolerance: Patient limited by pain Patient left: with call bell/phone within reach;in bed;with bed alarm set Nurse Communication: Mobility status PT Visit Diagnosis: Other abnormalities of gait and mobility (R26.89);Repeated falls (R29.6)    Time: 8766-8685 PT Time Calculation (min) (ACUTE ONLY): 41 min   Charges:   PT Evaluation $PT Eval Low Complexity: 1 Low PT Treatments $Therapeutic Activity: 23-37 mins PT  General Charges $$ ACUTE PT VISIT: 1 Visit         Benjiman, PT Acute Rehab [facility] [phone]   Benjiman VEAR Mulberry 10/18/2024, 2:48 PM

## 2024-10-18 NOTE — Procedures (Signed)
 Patient Name: Jody Wilson  MRN: [id number]  Epilepsy Attending: Arlin MALVA Krebs  Referring Physician/Provider: Will Almarie MATSU, MD  Date: 10/18/2024 Duration: 22.43 mins  Patient history: 72yo f with ams. EEG to evaluate for seizure  Level of alertness: Awake, asleep  AEDs during EEG study: None  Technical aspects: This EEG study was done with scalp electrodes positioned according to the 10-20 International system of electrode placement. Electrical activity was reviewed with band pass filter of 1-70Hz , sensitivity of 7 uV/mm, display speed of 70mm/sec with a 60Hz  notched filter applied as appropriate. EEG data were recorded continuously and digitally stored.  Video monitoring was available and reviewed as appropriate.  Description: The posterior dominant rhythm consists of 8 Hz activity of moderate voltage (25-35 uV) seen predominantly in posterior head regions, symmetric and reactive to eye opening and eye closing. Sleep was characterized by vertex waves, sleep spindles (12 to 14 Hz), maximal frontocentral region. Physiologic photic driving was not seen during photic stimulation. Hyperventilation was not performed.     IMPRESSION: This study is within normal limits. No seizures or epileptiform discharges were seen throughout the recording.  A normal interictal EEG does not exclude the diagnosis of epilepsy.   Stephaniemarie Stoffel O Maija Biggers

## 2024-10-18 NOTE — Progress Notes (Signed)
 PROGRESS NOTE    Jody Wilson  FMW:[id number] DOB: 02-Jan-1951 DOA: 10/16/2024 PCP: Dettinger, Fonda LABOR, MD  Brief Narrative: This patient was admitted from [facility] nursing home on 10/16/2024 with complaints of confusion hallucination seeing her dead mother in the room.  She was recently admitted to the hospital on the 15th of this month after a fall and was found to have right humerus closed fracture right pubic ramus fracture Ortho recommended conservative management and she was discharged to skilled nursing facility on the 22nd.  Past medical history significant for anxiety depression GERD sinus bradycardia hypertension orthostatic hypotension iron deficiency anemia history of vertebral fracture.  At the time of admission she also complained of dysuria her UA came back negative for urinary tract infection. At the time of admission she was hypothermic with leukocytosis and normal lactate level. CT head showed no acute abnormalities. CT chest and abdomen and pelvis were done-. A cluster of nodular density in the right upper lobe, likely post inflammatory versus atypical pneumonia. Clinical correlation is recommended. Large hiatal hernia containing the majority of the stomach and a segment of the transverse colon. No bowel obstruction. Normal appendix.Displaced fractures of the right anterior and inferior pubic rami as well as insufficiency fractures of bilateral sacral ala. Normal TSH, normal ammonia  Assessment & Plan:   Principal Problem:   Community acquired pneumonia Active Problems:   Altered mental status, unspecified   Essential hypertension   Hyperlipidemia   GERD (gastroesophageal reflux disease)   Iron deficiency anemia   Anxiety and depression   Closed fracture of neck of left femur (HCC)   Closed fracture of right inferior pubic ramus (HCC)   History of bradycardia   Sepsis (HCC)   #1 acute metabolic encephalopathy in the setting of hypoxia, community-acquired  pneumonia.  At baseline her mental status she is able to have a conversation and follow conversation she is not confused she is able to do her ADLs but currently she is not able to do any of her ADLs by herself.  She continues to talk staff that does not make any sense.  She was found to be hypoxic in the ED and was placed on 2 L of oxygen.  Chest x-ray was concerning for consolidation. On Rocephin and azithromycin .  #2 Sepsis secondary to CAP on admission she was hypothermic tachycardic with leukocytosis and hypoxia.  She was initially placed on vancomycin  and cefepime.  With ongoing change in mental status febrile antibiotics were stopped and she was started on Rocephin and azithromycin .(Cefepime side effects neurotoxicity) MRSA PCR negative.  WBCs trending down.  #3 hypertension review of cardiology notes recommended to restart Coreg  and olmesartan .  Her blood pressure remains elevated.  #4 depression and anxiety continue Celexa  and Wellbutrin  Restart Xanax .  #5 history of recent fracture of the right pubic ramus, nondisplaced right sacral alar and closed fracture of the proximal right humerus Ortho recommended conservative treatment.  #6 hypokalemia replete potassium 3.0 Mag 1.8 Bmp pending   Estimated body mass index is 23.63 kg/m as calculated from the following:   Height as of this encounter: 5' 3 (1.6 m).   Weight as of this encounter: 60.5 kg.  DVT prophylaxis: Lovenox  Code Status: Full code Family Communication: None  disposition Plan:  Status is: Inpatient Remains inpatient appropriate because: Acute illness   Consultants: none  Procedures: none Antimicrobials: rocephin and azithro  Subjective:  Patient received Haldol overnight and was mood restful No acute intracranial abnormality.  Multifocal T2 hyperintense  white matter signal most consistent with chronic small vessel disease. Objective: Vitals:   10/18/24 0200 10/18/24 0300 10/18/24 0819 10/18/24 1229  BP:  123/69  (!) 149/86 131/73  Pulse: 76  83   Resp: (!) 22     Temp:  98 F (36.7 C)  98.3 F (36.8 C)  TempSrc:  Axillary  Oral  SpO2:      Weight:      Height:        Intake/Output Summary (Last 24 hours) at 10/18/2024 1252 Last data filed at 10/18/2024 9177 Gross per 24 hour  Intake 530.1 ml  Output --  Net 530.1 ml   Filed Weights   10/16/24 2303  Weight: 60.5 kg    Examination:  General exam: Appears jittery and confused Respiratory system: Few rhonchi. Respiratory effort normal. Cardiovascular system: Tachycardic  gastrointestinal system: Abdomen is nondistended, soft and nontender. No organomegaly or masses felt. Normal bowel sounds heard. Central nervous system: Alert and oriented. No focal neurological deficits. Extremities: No edema.     Data Reviewed: I have personally reviewed following labs and imaging studies  CBC: Recent Labs  Lab 10/16/24 1616 10/17/24 0404  WBC 14.0* 11.9*  NEUTROABS 11.4*  --   HGB 12.2 12.0  HCT 36.7 37.6  MCV 97.1 100.5*  PLT 496* 513*   Basic Metabolic Panel: Recent Labs  Lab 10/16/24 1616 10/17/24 0404 10/17/24 0945 10/18/24 0425  NA 134* 136  --   --   K 3.0* 3.0*  --   --   CL 97* 103  --   --   CO2 24 24  --   --   GLUCOSE 123* 94  --   --   BUN 12 10  --   --   CREATININE 0.62 0.53  --   --   CALCIUM  9.8 9.1  --   --   MG  --   --  1.8 1.9   GFR: Estimated Creatinine Clearance: 52.6 mL/min (by C-G formula based on SCr of 0.53 mg/dL). Liver Function Tests: Recent Labs  Lab 10/16/24 1616 10/17/24 0404  AST 20 20  ALT 13 13  ALKPHOS 182* 179*  BILITOT 0.9 0.8  PROT 7.2 6.9  ALBUMIN 3.8 3.5   No results for input(s): LIPASE, AMYLASE in the last 168 hours. Recent Labs  Lab 10/17/24 0404  AMMONIA 28   Coagulation Profile: No results for input(s): INR, PROTIME in the last 168 hours. Cardiac Enzymes: Recent Labs  Lab 10/17/24 1640  CKTOTAL 206   BNP (last 3 results) No results for  input(s): PROBNP in the last 8760 hours. HbA1C: No results for input(s): HGBA1C in the last 72 hours. CBG: Recent Labs  Lab 10/16/24 1543 10/17/24 1117  GLUCAP 129* 119*   Lipid Profile: No results for input(s): CHOL, HDL, LDLCALC, TRIG, CHOLHDL, LDLDIRECT in the last 72 hours. Thyroid  Function Tests: Recent Labs    10/17/24 0404  TSH 1.720   Anemia Panel: No results for input(s): VITAMINB12, FOLATE, FERRITIN, TIBC, IRON, RETICCTPCT in the last 72 hours. Sepsis Labs: Recent Labs  Lab 10/16/24 1641 10/17/24 0404  PROCALCITON  --  0.10  LATICACIDVEN 1.1  --     Recent Results (from the past 240 hours)  Culture, blood (Routine X 2) w Reflex to ID Panel     Status: None (Preliminary result)   Collection Time: 10/16/24  4:14 PM   Specimen: BLOOD LEFT WRIST  Result Value Ref Range Status   Specimen Description  Final    BLOOD LEFT WRIST Performed at [facility], [address] [place], [place] [address]    Special Requests   Final    BOTTLES DRAWN AEROBIC AND ANAEROBIC Blood Culture adequate volume Performed at [facility], [address] [place], [place] [address]    Culture   Final    NO GROWTH 2 DAYS Performed at [facility], [address], [place], [place] [address]    Report Status PENDING  Incomplete  Culture, blood (Routine X 2) w Reflex to ID Panel     Status: None (Preliminary result)   Collection Time: 10/16/24  4:16 PM   Specimen: BLOOD RIGHT ARM  Result Value Ref Range Status   Specimen Description   Final    BLOOD RIGHT ARM Performed at [facility], [address] [place], [place] [address]    Special Requests   Final    BOTTLES DRAWN AEROBIC AND ANAEROBIC Blood Culture results may not be optimal due to an inadequate volume of blood received in culture bottles Performed at [facility], [address], [place], [place] [address]    Culture   Final    NO  GROWTH 2 DAYS Performed at [facility], [address], [place], [place] [address]    Report Status PENDING  Incomplete  MRSA Next Gen by PCR, Nasal     Status: None   Collection Time: 10/17/24  1:40 AM   Specimen: Urine, Clean Catch; Nasal Swab  Result Value Ref Range Status   MRSA by PCR Next Gen NOT DETECTED NOT DETECTED Final    Comment: (NOTE) The GeneXpert MRSA Assay (FDA approved for NASAL specimens only), is one component of a comprehensive MRSA colonization surveillance program. It is not intended to diagnose MRSA infection nor to guide or monitor treatment for MRSA infections. Test performance is not FDA approved in patients less than 58 years old. Performed at [facility], [address] [place], [place] [address]   Respiratory (~20 pathogens) panel by PCR     Status: None   Collection Time: 10/17/24  2:00 AM   Specimen: Nasopharyngeal Swab; Respiratory  Result Value Ref Range Status   Adenovirus NOT DETECTED NOT DETECTED Final   Coronavirus 229E NOT DETECTED NOT DETECTED Final    Comment: (NOTE) The Coronavirus on the Respiratory Panel, DOES NOT test for the novel  Coronavirus (2019 nCoV)    Coronavirus HKU1 NOT DETECTED NOT DETECTED Final   Coronavirus NL63 NOT DETECTED NOT DETECTED Final   Coronavirus OC43 NOT DETECTED NOT DETECTED Final   Metapneumovirus NOT DETECTED NOT DETECTED Final   Rhinovirus / Enterovirus NOT DETECTED NOT DETECTED Final   Influenza A NOT DETECTED NOT DETECTED Final   Influenza B NOT DETECTED NOT DETECTED Final   Parainfluenza Virus 1 NOT DETECTED NOT DETECTED Final   Parainfluenza Virus 2 NOT DETECTED NOT DETECTED Final   Parainfluenza Virus 3 NOT DETECTED NOT DETECTED Final   Parainfluenza Virus 4 NOT DETECTED NOT DETECTED Final   Respiratory Syncytial Virus NOT DETECTED NOT DETECTED Final   Bordetella pertussis NOT DETECTED NOT DETECTED Final   Bordetella Parapertussis NOT DETECTED NOT DETECTED Final    Chlamydophila pneumoniae NOT DETECTED NOT DETECTED Final   Mycoplasma pneumoniae NOT DETECTED NOT DETECTED Final    Comment: Performed at [facility], [address] [place], [place] [address]         Radiology  Studies: MR BRAIN W WO CONTRAST Result Date: 10/18/2024 EXAM: MRI BRAIN WITH AND WITHOUT CONTRAST 10/18/2024 12:24:58 PM TECHNIQUE: Multiplanar multisequence MRI of the head/brain was performed with and without the administration of 6 mL gadobutrol  (GADAVIST ) 1 MMOL/ML injection. COMPARISON: 09/07/2022 CLINICAL HISTORY: Mental status change, unknown cause. FINDINGS: BRAIN AND VENTRICLES: No acute infarct. No acute intracranial hemorrhage. No mass effect or midline shift. No hydrocephalus. The sella is unremarkable. Normal flow voids. No mass or abnormal enhancement. Multifocal hyperintense T2-weighted signal within the cerebral white matter, most commonly due to chronic small vessel disease. ORBITS: No acute abnormality. SINUSES: No acute abnormality. BONES AND SOFT TISSUES: Normal bone marrow signal and enhancement. No acute soft tissue abnormality. IMPRESSION: 1. No acute intracranial abnormality. 2. Multifocal T2 hyperintense white matter signal most consistent with chronic small vessel disease. Electronically signed by: Franky Stanford MD 10/18/2024 12:46 PM EDT RP Workstation: [id number]   DG Chest 1 View Result Date: 10/17/2024 EXAM: 1 VIEW XRAY OF THE CHEST 10/17/2024 11:31:00 AM COMPARISON: None available. CLINICAL HISTORY: SOB (shortness of breath) [id number]. FINDINGS: LUNGS AND PLEURA: Chronic coarsened interstitial markings without pulmonary edema. No pleural effusion. No pneumothorax. HEART AND MEDIASTINUM: Atherosclerotic plaque. Large Hiatal hernia. 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 Cervical spine surgical hardware noted. IMPRESSION: 1. No acute cardiopulmonary findings. 2. Large hiatal hernia. Electronically signed by: Norleen Kil MD 10/17/2024  12:56 PM EDT RP Workstation: [id number]   CT CHEST ABDOMEN PELVIS W CONTRAST Result Date: 10/16/2024 CLINICAL DATA:  Sepsis. EXAM: CT CHEST, ABDOMEN, AND PELVIS WITH CONTRAST TECHNIQUE: Multidetector CT imaging of the chest, abdomen and pelvis was performed following the standard protocol during bolus administration of intravenous contrast. RADIATION DOSE REDUCTION: This exam was performed according to the departmental dose-optimization program which includes automated exposure control, adjustment of the mA and/or kV according to patient size and/or use of iterative reconstruction technique. CONTRAST:  OMNIPAQUE  IOHEXOL  300 MG/ML  SOLN COMPARISON:  Chest radiograph dated 10/16/2024 and pelvic CT dated 10/03/2024. FINDINGS: CT CHEST FINDINGS Cardiovascular: There is no cardiomegaly or pericardial effusion. There is coronary vascular calcification. Moderate calcified and noncalcified plaque of the thoracic aorta. No aneurysmal dilatation or dissection. The origins of the great vessels of the aortic arch and the central pulmonary arteries are patent. Mediastinum/Nodes: No hilar or mediastinal adenopathy. There is a large hiatal hernia containing the majority of the stomach and a segment of the transverse colon. The esophagus is grossly unremarkable. No mediastinal fluid collection. Lungs/Pleura: Background of emphysema. There is partial compressive atelectasis of the lower lobes secondary to a large hiatal hernia. Faint cluster of nodular density in the right upper lobe likely post inflammatory or atypical infection. No consolidative changes. There is no pleural effusion pneumothorax. The central airways are patent. Musculoskeletal: No acute osseous pathology. Osteopenia with degenerative changes of the spine. Old sternal fracture. T12 vertebroplasty. CT ABDOMEN PELVIS FINDINGS No intra-abdominal free air or free fluid. Hepatobiliary: The liver is unremarkable. There is mild dilatation, post cholecystectomy.  No retained calcified stone noted in the central CBD. Pancreas: Unremarkable. No pancreatic ductal dilatation or surrounding inflammatory changes. Spleen: Normal in size without focal abnormality. Adrenals/Urinary Tract: The adrenal glands unremarkable. There is no hydronephrosis on either side. There is symmetric enhancement and excretion of contrast by both kidneys. The visualized ureters appear unremarkable the urinary bladder is poorly visualized due to streak artifact caused by bilateral total hip arthroplasties. Stomach/Bowel: There is no bowel obstruction or active inflammation. The appendix is normal. Vascular/Lymphatic:  Advanced aortoiliac atherosclerotic disease. The IVC is unremarkable. No portal venous gas. There is no adenopathy. Reproductive: Evaluation of the pelvic structures is very limited due to streak artifact caused by bilateral hip arthroplasties. Other: None Musculoskeletal: Bilateral total hip arthroplasties. Displaced fractures of the right anterior and inferior pubic rami as well as insufficiency fractures of bilateral sacral ala. There is osteopenia with degenerative changes of the spine. IMPRESSION: 1. A cluster of nodular density in the right upper lobe, likely post inflammatory versus atypical pneumonia. Clinical correlation is recommended. 2. Large hiatal hernia containing the majority of the stomach and a segment of the transverse colon. No bowel obstruction. Normal appendix. 3. Displaced fractures of the right anterior and inferior pubic rami as well as insufficiency fractures of bilateral sacral ala. 4.  Aortic Atherosclerosis (ICD10-I70.0). Electronically Signed   By: Vanetta Chou M.D.   On: 10/16/2024 19:35   CT Head Wo Contrast Result Date: 10/16/2024 EXAM: CT HEAD WITHOUT CONTRAST 10/16/2024 07:08:51 PM TECHNIQUE: CT of the head was performed without the administration of intravenous contrast. Automated exposure control, iterative reconstruction, and/or weight based  adjustment of the mA/kV was utilized to reduce the radiation dose to as low as reasonably achievable. COMPARISON: 06/26/2024 CLINICAL HISTORY: Mental status change, unknown cause. Pt BIB [facility] from [facility] with reports of AMS since Saturday morning. Pt has been more confused than normal. Pt has had recent fractures, oriented to herself. Pt reports burning with urination. Right clavicle fx. FINDINGS: BRAIN AND VENTRICLES: No acute hemorrhage. No evidence of acute infarct. No hydrocephalus. No extra-axial collection. No mass effect or midline shift. Calcific atherosclerosis. ORBITS: No acute abnormality. SINUSES: Left sphenoid sinus mucosal thickening. SOFT TISSUES AND SKULL: No acute soft tissue abnormality. No skull fracture. LIMITATIONS/ARTIFACTS: Motion limited study. IMPRESSION: 1. Motion degraded exam. 2. No acute intracranial abnormality. 3. Chronic left sphenoid sinus disease. Electronically signed by: Morene Hoard MD 10/16/2024 07:31 PM EDT RP Workstation: [id number]   DG Chest Port 1 View Result Date: 10/16/2024 EXAM: 1 VIEW(S) XRAY OF THE CHEST 10/16/2024 03:29:00 PM COMPARISON: 10/03/2024 CLINICAL HISTORY: [id number] AMS (altered mental status) [id number]. Per chart: Pt BIB [facility] from [facility] with reports of AMS since Saturday morning. Pt has been more confused than normal. Pt has had recent fractures, oriented to herself. Pt reports burning with urination. Right clavicle fx. 2L [place] at ; baseline. FINDINGS: LUNGS AND PLEURA: No focal pulmonary opacity. No pulmonary edema. No pleural effusion. No pneumothorax. HEART AND MEDIASTINUM: No acute abnormality of the cardiac and mediastinal silhouettes. Large hiatal hernia is again noted. BONES AND SOFT TISSUES: No acute osseous abnormality. IMPRESSION: 1. No acute cardiopulmonary abnormality. 2. Large hiatal hernia. Electronically signed by: Lynwood Seip MD 10/16/2024 03:51 PM EDT RP Workstation: [id number]    Scheduled Meds:   aspirin  EC  81 mg Oral Daily   buPROPion   150 mg Oral Daily   carvedilol   3.125 mg Oral BID WC   citalopram   40 mg Oral Daily   docusate sodium   100 mg Oral BID   enoxaparin  (LOVENOX ) injection  40 mg Subcutaneous Q24H   irbesartan   150 mg Oral Daily   pantoprazole   40 mg Oral BID AC   pravastatin   40 mg Oral QPM   QUEtiapine  50 mg Oral QHS   sodium chloride  flush  3 mL Intravenous Q12H   Continuous Infusions:  acetaminophen  1,000 mg (10/18/24 0204)   azithromycin  Stopped (10/18/24 0043)   cefTRIAXone (ROCEPHIN)  IV Stopped (10/17/24  2202)     LOS: 2 days    Almarie KANDICE Hoots, MD  10/18/2024, 12:52 PM

## 2024-10-18 NOTE — Consult Note (Addendum)
 NAME:  Jody Wilson, MRN:  [id number], DOB:  [birth year], LOS: 2 ADMISSION DATE:  10/16/2024, CONSULTATION DATE:  10/17/2024 REFERRING MD: TRH, CHIEF COMPLAINT: AMS  History of Present Illness:  Jody Wilson is a 73 year old female with past medical history significant for HTN, HLD, bradycardia, iron deficiency anemia, pulmonary embolism, CAD, and anxiety who presented to the ED at [facility] 10/28 from local SNF for complaints of altered mental status with visual hallucinations.  Of note patient was recently admitted at this facility 10/15-10/22 after suffering mechanical fall resulting in right closed humerus and right pubic ramus fracture managed conservatively.  On ED arrival patient was seen mildly hypothermic, tachypneic, tachycardic, and slightly hypertensive.  Was also observed to be quite altered.  Lab work on admission significant for NA 134, K3.0, CL 97, glucose 123, alkaline phosphatase 182, WBC 14.0, platelet 496, neutrophils 11.4, lactic acid within normal at 1.1.  UA significant for few bacteria but negative nitrates and leukocytes.  Imaging including head, chest, abdomen, pelvis relatively negative for any acute findings admission. CT scan reviewed by me: Very minute nodularity in right upper lobe less likely to be pneumonia.  Afternoon 10/29 PCCM consulted for the assistance in working up worsening altered mental status.  Vital signs remained similar to admission with tachypnea, tachycardia, and hypertension seen.  On evaluating the patient she is having hyperactive delirium.  She is intermittently mouthing and saying words.  She is not aware of her surroundings.  She is having intermittent myoclonic jerks of her arms and legs, not seizure-like activity. I spoke to the son.  He said that she was acting confused since Saturday.  She was finally brought to the emergency room yesterday.  She has not been out of the bed at the nursing home because of her fractures.  She was  intermittently getting pain meds over there.  Pertinent  Medical History  HTN, HLD, bradycardia, iron deficiency anemia, pulmonary embolism, CAD, and anxiety  Significant Hospital Events: Including procedures, antibiotic start and stop dates in addition to other pertinent events   10/28 presented from local SNF with altered mental status, workup on admission concerning for sepsis secondary to CAP  Interim History / Subjective:  Overnight he did get a dose of Haldol because of agitation.  She also received her Seroquel.  Currently the patient is sleeping in a bed and not having the jerking movement like yesterday.  Objective    Blood pressure (!) 149/86, pulse 83, temperature 98 F (36.7 C), temperature source Axillary, resp. rate (!) 22, height 5' 3 (1.6 m), weight 60.5 kg, SpO2 92%.        Intake/Output Summary (Last 24 hours) at 10/18/2024 1039 Last data filed at 10/18/2024 9177 Gross per 24 hour  Intake 650.1 ml  Output --  Net 650.1 ml   Filed Weights   10/16/24 2303  Weight: 60.5 kg    Examination: General: Elderly lady sleeping in her bed. Lungs: clear to auscultation bilaterally.  Heart: regular rate rhythm, no murmur appreciated.  Abdomen: non tender, non distended. Normal BS.  Neuro: Sleeping.  I did not try to wake her up.   Resolved problem list   Assessment and Plan  Hyperactive delirium: Possible PNA: Acute hypoxic resp failure: - Delirium likely combination of being in the hospital and nursing home in the same bed along with use of opiates. - NMS/serotonin syndrome less likely with normal CPK. - Continue treatment for pneumonia, procalcitonin 0.1.  Finish 5-day course.  On  ceftriaxone and azithromycin .  Infectious workup negative so far. - Modify pain regimen with discontinuing opiates. Multimodal pain regimen with scheduled Tylenol .  IV Tylenol  ordered. - IV Dilaudid  and p.o. Oxy discontinued.  Can give as needed doses of IV Dilaudid  if severe pain. -  spo2 goal >90%.  - Started on Seroquel 50 mg nightly. - Delirium precautions.   HTN: - continue coreg  and ARB.   Depression/Anxiety: - Takes Celexa  and as needed Ativan  at home.  In addition is on Wellbutrin . - Stop Ativan .  Very low-dose.  Can exacerbate delirium. - Rule out serotonin syndrome as above.   mechanical fall: Fracture right pubic rami, right sacral ala, proximal right humerus: - Ortho following.  Conservative care.  No critical care needs currently.  We will sign off.  Please reach out with any questions.   Labs   CBC: Recent Labs  Lab 10/16/24 1616 10/17/24 0404  WBC 14.0* 11.9*  NEUTROABS 11.4*  --   HGB 12.2 12.0  HCT 36.7 37.6  MCV 97.1 100.5*  PLT 496* 513*    Basic Metabolic Panel: Recent Labs  Lab 10/16/24 1616 10/17/24 0404 10/17/24 0945 10/18/24 0425  NA 134* 136  --   --   K 3.0* 3.0*  --   --   CL 97* 103  --   --   CO2 24 24  --   --   GLUCOSE 123* 94  --   --   BUN 12 10  --   --   CREATININE 0.62 0.53  --   --   CALCIUM  9.8 9.1  --   --   MG  --   --  1.8 1.9   GFR: Estimated Creatinine Clearance: 52.6 mL/min (by C-G formula based on SCr of 0.53 mg/dL). Recent Labs  Lab 10/16/24 1616 10/16/24 1641 10/17/24 0404  PROCALCITON  --   --  0.10  WBC 14.0*  --  11.9*  LATICACIDVEN  --  1.1  --     Liver Function Tests: Recent Labs  Lab 10/16/24 1616 10/17/24 0404  AST 20 20  ALT 13 13  ALKPHOS 182* 179*  BILITOT 0.9 0.8  PROT 7.2 6.9  ALBUMIN 3.8 3.5   No results for input(s): LIPASE, AMYLASE in the last 168 hours. Recent Labs  Lab 10/17/24 0404  AMMONIA 28    ABG    Component Value Date/Time   PHART 7.34 (L) 10/17/2024 1550   PCO2ART 34 10/17/2024 1550   PO2ART 73 (L) 10/17/2024 1550   HCO3 18.3 (L) 10/17/2024 1550   ACIDBASEDEF 6.6 (H) 10/17/2024 1550   O2SAT 96.6 10/17/2024 1550     Coagulation Profile: No results for input(s): INR, PROTIME in the last 168 hours.  Cardiac Enzymes: Recent  Labs  Lab 10/17/24 1640  CKTOTAL 206    HbA1C: No results found for: HGBA1C  CBG: Recent Labs  Lab 10/16/24 1543 10/17/24 1117  GLUCAP 129* 119*    Past Medical History:  She,  has a past medical history of Anemia, Anxiety, Aortic atherosclerosis, B12 deficiency (09/25/2019), Carotid artery disease, Depression, GERD (gastroesophageal reflux disease), Hyperlipidemia, Hypertension, Orthostatic hypotension, Osteoporosis, and Pulmonary nodule (02/09/2023).   Surgical History:   Past Surgical History:  Procedure Laterality Date   ABDOMINAL HYSTERECTOMY     BACK SURGERY     CHOLECYSTECTOMY     FOOT SURGERY     7 in the past   HIP ARTHROPLASTY Right 02/11/2023   Procedure: ARTHROPLASTY BIPOLAR HIP (HEMIARTHROPLASTY);  Surgeon: Onesimo Oneil LABOR, MD;  Location: [facility];  Service: Orthopedics;  Laterality: Right;   HIP ARTHROPLASTY Left 04/13/2024   Procedure: HEMIARTHROPLASTY (BIPOLAR) HIP, DIRECT LATERAL  FOR FRACTURE;  Surgeon: Margrette Taft BRAVO, MD;  Location: [facility];  Service: Orthopedics;  Laterality: Left;   IR GENERIC HISTORICAL  12/09/2016   IR RADIOLOGIST EVAL & MGMT 12/09/2016 [facility]   IR GENERIC HISTORICAL  12/10/2016   IR VERTEBROPLASTY CERV/THOR BX INC UNI/BIL INC/INJECT/IMAGING 12/10/2016 [facility]   LEFT HEART CATH AND CORONARY ANGIOGRAPHY N/A 09/30/2020   Procedure: LEFT HEART CATH AND CORONARY ANGIOGRAPHY;  Surgeon: Elmira Newman PARAS, MD;  Location: [facility];  Service: Cardiovascular;  Laterality: N/A;   LEFT HEART CATH AND CORONARY ANGIOGRAPHY N/A 06/27/2024   Procedure: LEFT HEART CATH AND CORONARY ANGIOGRAPHY;  Surgeon: Verlin Lonni BIRCH, MD;  Location: [facility];  Service: Cardiovascular;  Laterality: N/A;   TRACHEOSTOMY TUBE PLACEMENT  1982     Social History:   reports that she quit smoking about 20 months ago. Her smoking use included cigarettes. She started smoking about 26 years ago. She has a 6.3 pack-year smoking  history. She has never been exposed to tobacco smoke. She has never used smokeless tobacco. She reports that she does not currently use alcohol. She reports that she does not use drugs.   Family History:  Her family history includes Aneurysm in her paternal uncle; Atrial fibrillation in her half-sister; Breast cancer in her niece; Congestive Heart Failure in her mother; Diabetes in her mother; Heart attack in her maternal grandfather and maternal grandmother; Heart disease in her mother; Hyperlipidemia in her mother; Hypertension in her mother and son; Liver disease (age of onset: 64) in her son; Lupus in her half-sister; Other (age of onset: 35) in her son.   Allergies Allergies  Allergen Reactions   Tetracyclines & Related Itching and Swelling     Home Medications  Prior to Admission medications   Medication Sig Start Date End Date Taking? Authorizing Provider  ALPRAZolam  (XANAX ) 0.25 MG tablet Take 0.25 mg by mouth 2 (two) times daily as needed for anxiety.   Yes [provider]  aspirin  EC 81 MG tablet TAKE 1 PO BID THRU 05/13/24 THEN RESUME 1 PO DAILY Patient taking differently: Take 81 mg by mouth daily. 04/16/24  Yes Johnson, Clanford L, MD  buPROPion  (WELLBUTRIN  XL) 150 MG 24 hr tablet Take 1 tablet (150 mg total) by mouth daily. 06/04/24  Yes Dettinger, Fonda LABOR, MD  citalopram  (CELEXA ) 40 MG tablet Take 1 tablet (40 mg total) by mouth daily. 06/04/24  Yes Dettinger, Fonda LABOR, MD  cyclobenzaprine  (FLEXERIL ) 10 MG tablet Take 10 mg by mouth in the morning and at bedtime.   Yes [provider]  docusate sodium  (COLACE) 100 MG capsule Take 1 capsule (100 mg total) by mouth 2 (two) times daily. 04/16/24  Yes Johnson, Clanford L, MD  ondansetron  (ZOFRAN ) 4 MG tablet Take 1 tablet (4 mg total) by mouth every 8 (eight) hours as needed for nausea or vomiting. 05/08/24  Yes Francesca Elsie CROME, MD  oxyCODONE  (OXY IR/ROXICODONE ) 5 MG immediate release tablet Take 1 tablet (5 mg  total) by mouth every 4 (four) hours as needed for moderate pain (pain score 4-6) or breakthrough pain. 10/10/24  Yes Odell Celinda Balo, MD  polyethylene glycol (MIRALAX  / GLYCOLAX ) 17 g packet Take 17 g by mouth daily as needed for mild constipation. 04/16/24  Yes Johnson, Clanford L,  MD  pravastatin  (PRAVACHOL ) 40 MG tablet Take 1 tablet (40 mg total) by mouth every evening. Patient taking differently: Take 40 mg by mouth daily. 06/04/24  Yes Dettinger, Fonda LABOR, MD  ALPRAZolam  (XANAX ) 0.5 MG tablet Take 0.5 tablets (0.25 mg total) by mouth 2 (two) times daily as needed for anxiety. Patient not taking: Reported on 10/17/2024 10/10/24   Odell Celinda Balo, MD  ergocalciferol  (VITAMIN D2) 1.25 MG (50000 UT) capsule Take 1 capsule (50,000 Units total) by mouth every 14 (fourteen) days. Patient not taking: Reported on 10/17/2024 07/10/24   Geofm Delon BRAVO, NP  pantoprazole  (PROTONIX ) 40 MG tablet Take 1 tablet (40 mg total) by mouth 2 (two) times daily before a meal. Patient not taking: Reported on 10/17/2024 06/04/24   Dettinger, Fonda LABOR, MD   I personally spent a total of 45 minutes in the care of the patient today including preparing to see the patient, getting/reviewing separately obtained history, performing a medically appropriate exam/evaluation, counseling and educating, placing orders, referring and communicating with other health care professionals, documenting clinical information in the EHR, and independently interpreting results.   Sammi JONETTA Fredericks, MD Pulmonary, Critical Care and Sleep Attending.  Pager: [phone]  10/18/2024, 10:39 AM

## 2024-10-18 NOTE — Progress Notes (Signed)
 Orthopedic Tech Progress Note Patient Details:  Jody Wilson 1951/12/01 [id number]  Ortho Devices Type of Ortho Device: Shoulder immobilizer Ortho Device/Splint Location: right Ortho Device/Splint Interventions: Ordered, Application, Adjustment   Post Interventions Patient Tolerated: Well Instructions Provided: Adjustment of device, Care of device  [name] 10/18/2024, 1:28 PM

## 2024-10-19 DIAGNOSIS — J189 Pneumonia, unspecified organism: Secondary | ICD-10-CM | POA: Diagnosis not present

## 2024-10-19 LAB — COMPREHENSIVE METABOLIC PANEL WITH GFR
ALT: 9 U/L (ref 0–44)
AST: 13 U/L — ABNORMAL LOW (ref 15–41)
Albumin: 3.1 g/dL — ABNORMAL LOW (ref 3.5–5.0)
Alkaline Phosphatase: 172 U/L — ABNORMAL HIGH (ref 38–126)
Anion gap: 18 — ABNORMAL HIGH (ref 5–15)
BUN: 16 mg/dL (ref 8–23)
CO2: 18 mmol/L — ABNORMAL LOW (ref 22–32)
Calcium: 8.9 mg/dL (ref 8.9–10.3)
Chloride: 104 mmol/L (ref 98–111)
Creatinine, Ser: 0.58 mg/dL (ref 0.44–1.00)
GFR, Estimated: >60 mL/min (ref >60)
Glucose, Bld: 76 mg/dL (ref 70–99)
Potassium: 2.9 mmol/L — ABNORMAL LOW (ref 3.5–5.1)
Sodium: 141 mmol/L (ref 135–145)
Total Bilirubin: 0.5 mg/dL (ref 0.0–1.2)
Total Protein: 6 g/dL — ABNORMAL LOW (ref 6.5–8.1)

## 2024-10-19 LAB — CBC
HCT: 32.5 % — ABNORMAL LOW (ref 36.0–46.0)
Hemoglobin: 10.8 g/dL — ABNORMAL LOW (ref 12.0–15.0)
MCH: 33.5 pg (ref 26.0–34.0)
MCHC: 33.2 g/dL (ref 30.0–36.0)
MCV: 100.9 fL — ABNORMAL HIGH (ref 80.0–100.0)
Platelets: 531 K/uL — ABNORMAL HIGH (ref 150–400)
RBC: 3.22 MIL/uL — ABNORMAL LOW (ref 3.87–5.11)
RDW: 13.5 % (ref 11.5–15.5)
WBC: 7.8 K/uL (ref 4.0–10.5)
nRBC: 0 % (ref 0.0–0.2)

## 2024-10-19 MED ORDER — POTASSIUM CHLORIDE 10 MEQ/100ML IV SOLN
10.0000 meq | INTRAVENOUS | Status: AC
  Administered 2024-10-19: 10 meq via INTRAVENOUS
  Filled 2024-10-19: qty 100

## 2024-10-19 MED ORDER — ENSURE PLUS HIGH PROTEIN PO LIQD
237.0000 mL | Freq: Two times a day (BID) | ORAL | Status: DC
  Administered 2024-10-19 – 2024-10-22 (×5): 237 mL via ORAL

## 2024-10-19 MED ORDER — ALUM & MAG HYDROXIDE-SIMETH 200-200-20 MG/5ML PO SUSP
30.0000 mL | Freq: Four times a day (QID) | ORAL | Status: DC | PRN
  Administered 2024-10-19: 30 mL via ORAL
  Filled 2024-10-19: qty 30

## 2024-10-19 MED ORDER — CARVEDILOL 12.5 MG PO TABS
12.5000 mg | ORAL_TABLET | Freq: Two times a day (BID) | ORAL | Status: DC
Start: 2024-10-19 — End: 2024-10-22
  Administered 2024-10-19 – 2024-10-22 (×6): 12.5 mg via ORAL
  Filled 2024-10-19 (×6): qty 1

## 2024-10-19 MED ORDER — POTASSIUM CHLORIDE CRYS ER 20 MEQ PO TBCR
40.0000 meq | EXTENDED_RELEASE_TABLET | Freq: Two times a day (BID) | ORAL | Status: AC
  Administered 2024-10-19 (×2): 40 meq via ORAL
  Filled 2024-10-19 (×2): qty 2

## 2024-10-19 MED ORDER — POTASSIUM CHLORIDE 10 MEQ/100ML IV SOLN
10.0000 meq | INTRAVENOUS | Status: AC
  Administered 2024-10-19 (×2): 10 meq via INTRAVENOUS
  Filled 2024-10-19 (×2): qty 100

## 2024-10-19 NOTE — Progress Notes (Signed)
 PROGRESS NOTE    Jody Wilson  FMW:[id number] DOB: 01-04-1951 DOA: 10/16/2024 PCP: Dettinger, Fonda LABOR, MD  Brief Narrative: This patient was admitted from [facility] nursing home on 10/16/2024 with complaints of confusion hallucination seeing her dead mother in the room.  She was recently admitted to the hospital on the 15th of this month after a fall and was found to have right humerus closed fracture right pubic ramus fracture Ortho recommended conservative management and she was discharged to skilled nursing facility on the 22nd.  Past medical history significant for anxiety depression GERD sinus bradycardia hypertension orthostatic hypotension iron deficiency anemia history of vertebral fracture.  At the time of admission she also complained of dysuria her UA came back negative for urinary tract infection. At the time of admission she was hypothermic with leukocytosis and normal lactate level. CT head showed no acute abnormalities. CT chest and abdomen and pelvis were done-. A cluster of nodular density in the right upper lobe, likely post inflammatory versus atypical pneumonia. Clinical correlation is recommended. Large hiatal hernia containing the majority of the stomach and a segment of the transverse colon. No bowel obstruction. Normal appendix.Displaced fractures of the right anterior and inferior pubic rami as well as insufficiency fractures of bilateral sacral ala. Normal TSH, normal ammonia  Assessment & Plan:   Principal Problem:   Community acquired pneumonia Active Problems:   Altered mental status, unspecified   Essential hypertension   Hyperlipidemia   GERD (gastroesophageal reflux disease)   Iron deficiency anemia   Anxiety and depression   Closed fracture of neck of left femur (HCC)   Closed fracture of right inferior pubic ramus (HCC)   History of bradycardia   Sepsis (HCC)   Dehydration   #1 acute metabolic encephalopathy in the setting of hypoxia,  community-acquired pneumonia.  At baseline her mental status she is able to have a conversation and follow conversation she is not confused she is able to do her ADLs.  Her mental status is improved with stopping narcotics avoiding Ativan  excetra. She was found to be hypoxic in the ED and was placed on 2 L of oxygen.  Chest x-ray was concerning for consolidation. On Rocephin and azithromycin .  #2 Sepsis secondary to CAP on admission she was hypothermic tachycardic with leukocytosis and hypoxia.  She was initially placed on vancomycin  and cefepime.  With ongoing change in mental status febrile antibiotics were stopped and she was started on Rocephin and azithromycin .(Cefepime side effects neurotoxicity) MRSA PCR negative.  WBCs trending down.  #3 hypertension review of cardiology notes recommended to restart Coreg  and olmesartan .  Her blood pressure remains elevated.  #4 depression and anxiety continue Celexa  and Wellbutrin  Restart Xanax .  #5 history of recent fracture of the right pubic ramus, nondisplaced right sacral alar and closed fracture of the proximal right humerus Ortho recommended conservative treatment.  Tylenol  and Toradol  for pain control.  #6 hypokalemia potassium 2.9 mag 1.9 replete potassium.  Recheck labs in AM.  Estimated body mass index is 23.63 kg/m as calculated from the following:   Height as of this encounter: 5' 3 (1.6 m).   Weight as of this encounter: 60.5 kg.  DVT prophylaxis: Lovenox  Code Status: Full code Family Communication: None  disposition Plan:  Status is: Inpatient Remains inpatient appropriate because: Acute illness   Consultants: none  Procedures: none Antimicrobials: rocephin and azithro  Subjective:  Much awake and alert and answering questions appropriately following commands EEG negative for active seizures potassium 2.9  Objective: Vitals:   10/18/24 1718 10/18/24 1801 10/18/24 1930 10/19/24 0736  BP: 112/62 120/67 112/65 (!) 192/165   Pulse: 75     Resp:  (!) 22    Temp:  97.9 F (36.6 C) 97.9 F (36.6 C)   TempSrc:  Oral Oral   SpO2:      Weight:      Height:        Intake/Output Summary (Last 24 hours) at 10/19/2024 1150 Last data filed at 10/19/2024 0800 Gross per 24 hour  Intake 604.76 ml  Output 600 ml  Net 4.76 ml   Filed Weights   10/16/24 2303  Weight: 60.5 kg    Examination:  General exam: Appears jittery and confused Respiratory system: Few rhonchi. Respiratory effort normal. Cardiovascular system: Tachycardic  gastrointestinal system: Abdomen is nondistended, soft and nontender. No organomegaly or masses felt. Normal bowel sounds heard. Central nervous system: Alert and oriented. No focal neurological deficits. Extremities: No edema.     Data Reviewed: I have personally reviewed following labs and imaging studies  CBC: Recent Labs  Lab 10/16/24 1616 10/17/24 0404 10/18/24 1319 10/19/24 0405  WBC 14.0* 11.9* 10.7* 7.8  NEUTROABS 11.4*  --   --   --   HGB 12.2 12.0 11.8* 10.8*  HCT 36.7 37.6 36.4 32.5*  MCV 97.1 100.5* 99.5 100.9*  PLT 496* 513* 634* 531*   Basic Metabolic Panel: Recent Labs  Lab 10/16/24 1616 10/17/24 0404 10/17/24 0945 10/18/24 0425 10/18/24 1319 10/19/24 0405  NA 134* 136  --   --  139 141  K 3.0* 3.0*  --   --  3.6 2.9*  CL 97* 103  --   --  104 104  CO2 24 24  --   --  16* 18*  GLUCOSE 123* 94  --   --  93 76  BUN 12 10  --   --  16 16  CREATININE 0.62 0.53  --   --  0.53 0.58  CALCIUM  9.8 9.1  --   --  9.6 8.9  MG  --   --  1.8 1.9  --   --    GFR: Estimated Creatinine Clearance: 52.6 mL/min (by C-G formula based on SCr of 0.58 mg/dL). Liver Function Tests: Recent Labs  Lab 10/16/24 1616 10/17/24 0404 10/18/24 1319 10/19/24 0405  AST 20 20 18  13*  ALT 13 13 14 9   ALKPHOS 182* 179* 195* 172*  BILITOT 0.9 0.8 0.7 0.5  PROT 7.2 6.9 6.9 6.0*  ALBUMIN 3.8 3.5 3.6 3.1*   No results for input(s): LIPASE, AMYLASE in the last 168  hours. Recent Labs  Lab 10/17/24 0404  AMMONIA 28   Coagulation Profile: No results for input(s): INR, PROTIME in the last 168 hours. Cardiac Enzymes: Recent Labs  Lab 10/17/24 1640  CKTOTAL 206   BNP (last 3 results) No results for input(s): PROBNP in the last 8760 hours. HbA1C: No results for input(s): HGBA1C in the last 72 hours. CBG: Recent Labs  Lab 10/16/24 1543 10/17/24 1117  GLUCAP 129* 119*   Lipid Profile: No results for input(s): CHOL, HDL, LDLCALC, TRIG, CHOLHDL, LDLDIRECT in the last 72 hours. Thyroid  Function Tests: Recent Labs    10/17/24 0404  TSH 1.720   Anemia Panel: No results for input(s): VITAMINB12, FOLATE, FERRITIN, TIBC, IRON, RETICCTPCT in the last 72 hours. Sepsis Labs: Recent Labs  Lab 10/16/24 1641 10/17/24 0404  PROCALCITON  --  0.10  LATICACIDVEN 1.1  --  Recent Results (from the past 240 hours)  Culture, blood (Routine X 2) w Reflex to ID Panel     Status: None (Preliminary result)   Collection Time: 10/16/24  4:14 PM   Specimen: BLOOD LEFT WRIST  Result Value Ref Range Status   Specimen Description   Final    BLOOD LEFT WRIST Performed at [facility], [address] [place], [place] [address]    Special Requests   Final    BOTTLES DRAWN AEROBIC AND ANAEROBIC Blood Culture adequate volume Performed at [facility], [address], [place], [place] [address]    Culture   Final    NO GROWTH 3 DAYS Performed at [facility], [address], [place], [place] [address]    Report Status PENDING  Incomplete  Culture, blood (Routine X 2) w Reflex to ID Panel     Status: None (Preliminary result)   Collection Time: 10/16/24  4:16 PM   Specimen: BLOOD RIGHT ARM  Result Value Ref Range Status   Specimen Description   Final    BLOOD RIGHT ARM Performed at [facility], [address], [place], [place] [address]    Special Requests   Final    BOTTLES DRAWN  AEROBIC AND ANAEROBIC Blood Culture results may not be optimal due to an inadequate volume of blood received in culture bottles Performed at [facility], [address] [place], [place] [address]    Culture   Final    NO GROWTH 3 DAYS Performed at [facility], [address], [place], [place] [address]    Report Status PENDING  Incomplete  MRSA Next Gen by PCR, Nasal     Status: None   Collection Time: 10/17/24  1:40 AM   Specimen: Urine, Clean Catch; Nasal Swab  Result Value Ref Range Status   MRSA by PCR Next Gen NOT DETECTED NOT DETECTED Final    Comment: (NOTE) The GeneXpert MRSA Assay (FDA approved for NASAL specimens only), is one component of a comprehensive MRSA colonization surveillance program. It is not intended to diagnose MRSA infection nor to guide or monitor treatment for MRSA infections. Test performance is not FDA approved in patients less than 76 years old. Performed at [facility], [address], [place], [place] [address]   Respiratory (~20 pathogens) panel by PCR     Status: None   Collection Time: 10/17/24  2:00 AM   Specimen: Nasopharyngeal Swab; Respiratory  Result Value Ref Range Status   Adenovirus NOT DETECTED NOT DETECTED Final   Coronavirus 229E NOT DETECTED NOT DETECTED Final    Comment: (NOTE) The Coronavirus on the Respiratory Panel, DOES NOT test for the novel  Coronavirus (2019 nCoV)    Coronavirus HKU1 NOT DETECTED NOT DETECTED Final   Coronavirus NL63 NOT DETECTED NOT DETECTED Final   Coronavirus OC43 NOT DETECTED NOT DETECTED Final   Metapneumovirus NOT DETECTED NOT DETECTED Final   Rhinovirus / Enterovirus NOT DETECTED NOT DETECTED Final   Influenza A NOT DETECTED NOT DETECTED Final   Influenza B NOT DETECTED NOT DETECTED Final   Parainfluenza Virus 1 NOT DETECTED NOT DETECTED Final   Parainfluenza Virus 2 NOT DETECTED NOT DETECTED Final   Parainfluenza Virus 3 NOT DETECTED NOT DETECTED  Final   Parainfluenza Virus 4 NOT DETECTED NOT DETECTED Final   Respiratory Syncytial Virus NOT DETECTED NOT DETECTED Final   Bordetella pertussis NOT DETECTED NOT DETECTED Final  Bordetella Parapertussis NOT DETECTED NOT DETECTED Final   Chlamydophila pneumoniae NOT DETECTED NOT DETECTED Final   Mycoplasma pneumoniae NOT DETECTED NOT DETECTED Final    Comment: Performed at [facility], [address] [place], [place] [address]         Radiology Studies: EEG adult Result Date: 10/18/2024 Shelton Arlin KIDD, MD     10/18/2024  4:57 PM Patient Name: CECYLIA BRAZILL MRN: [id number] Epilepsy Attending: Arlin KIDD Shelton Referring Physician/Provider: Will Almarie MATSU, MD Date: 10/18/2024 Duration: 22.43 mins Patient history: 72yo f with ams. EEG to evaluate for seizure Level of alertness: Awake, asleep AEDs during EEG study: None Technical aspects: This EEG study was done with scalp electrodes positioned according to the 10-20 International system of electrode placement. Electrical activity was reviewed with band pass filter of 1-70Hz , sensitivity of 7 uV/mm, display speed of 2mm/sec with a 60Hz  notched filter applied as appropriate. EEG data were recorded continuously and digitally stored.  Video monitoring was available and reviewed as appropriate. Description: The posterior dominant rhythm consists of 8 Hz activity of moderate voltage (25-35 uV) seen predominantly in posterior head regions, symmetric and reactive to eye opening and eye closing. Sleep was characterized by vertex waves, sleep spindles (12 to 14 Hz), maximal frontocentral region. Physiologic photic driving was not seen during photic stimulation. Hyperventilation was not performed.   IMPRESSION: This study is within normal limits. No seizures or epileptiform discharges were seen throughout the recording. A normal interictal EEG does not exclude the diagnosis of epilepsy. Arlin KIDD Shelton   MR BRAIN W WO CONTRAST Result Date:  10/18/2024 EXAM: MRI BRAIN WITH AND WITHOUT CONTRAST 10/18/2024 12:24:58 PM TECHNIQUE: Multiplanar multisequence MRI of the head/brain was performed with and without the administration of 6 mL gadobutrol  (GADAVIST ) 1 MMOL/ML injection. COMPARISON: 09/07/2022 CLINICAL HISTORY: Mental status change, unknown cause. FINDINGS: BRAIN AND VENTRICLES: No acute infarct. No acute intracranial hemorrhage. No mass effect or midline shift. No hydrocephalus. The sella is unremarkable. Normal flow voids. No mass or abnormal enhancement. Multifocal hyperintense T2-weighted signal within the cerebral white matter, most commonly due to chronic small vessel disease. ORBITS: No acute abnormality. SINUSES: No acute abnormality. BONES AND SOFT TISSUES: Normal bone marrow signal and enhancement. No acute soft tissue abnormality. IMPRESSION: 1. No acute intracranial abnormality. 2. Multifocal T2 hyperintense white matter signal most consistent with chronic small vessel disease. Electronically signed by: Franky Stanford MD 10/18/2024 12:46 PM EDT RP Workstation: [id number]    Scheduled Meds:  aspirin  EC  81 mg Oral Daily   buPROPion   150 mg Oral Daily   carvedilol   12.5 mg Oral BID WC   citalopram   40 mg Oral Daily   docusate sodium   100 mg Oral BID   enoxaparin  (LOVENOX ) injection  40 mg Subcutaneous Q24H   feeding supplement  237 mL Oral BID BM   irbesartan   150 mg Oral Daily   pantoprazole   40 mg Oral BID AC   potassium chloride   40 mEq Oral BID   pravastatin   40 mg Oral QPM   QUEtiapine  50 mg Oral QHS   sodium chloride  flush  3 mL Intravenous Q12H   Continuous Infusions:  azithromycin  500 mg (10/18/24 2146)   cefTRIAXone (ROCEPHIN)  IV 2 g (10/18/24 2146)   potassium chloride  10 mEq (10/19/24 1028)     LOS: 3 days    Almarie MATSU Will, MD  10/19/2024, 11:50 AM

## 2024-10-19 NOTE — Evaluation (Signed)
 Occupational Therapy Evaluation Patient Details Name: Jody Wilson MRN: [id number] DOB: 09-10-1951 Today's Date: 10/19/2024   History of Present Illness   Patient is a 73 year old female who was participating in short term rehab for recent right pubic rami Fx and right proximal humerus Fx (surgery anticipated in Nov) in local SNF who was transported to the ED with AMS, including hallucinations reported by family.  Dx with sepsis in setting of PNA.  PMHx includes HTN, HLD, aortic atherosclerosis, carotid artery disease (cath), recent Hx of smoking, anemia, cholecystectomy, hysterectomy, osteoporosis, bilateral hip hemiarthroplasty (right, 2/24; left, 4/25), foot surgery x7, anxiety, depression     Clinical Impressions PTA, patient was undergoing rehab at [facility] for fractures sustained 10/03/24.  Prior to that admission, patient was living at home with spouse and independent with all ADL and IADL activities, including driving.  Patient has experienced a significant decline in self-care due to prior injuries as well as current illness, with self-care tasks requiring up to MaxA that patient previously completed independently.  During evaluation, patient's blood pressure showed a significant increase s/p patient's transition to EOB so that OOB activities were abandoned and RN was notified.  Patient will benefit from continued inpatient follow up therapy, < 3 hours/day, and patient's intent is to resume short term rehab with ultimate goal of returning home.  Acute OT will continue to follow patient while in the hospital to address performance deficits described herein.  Thank you for allowing us  to participate in the care of this patient.      If plan is discharge home, recommend the following:   A lot of help with walking and/or transfers;A lot of help with bathing/dressing/bathroom;Assist for transportation;Assistance with cooking/housework     Functional Status Assessment   Patient has  had a recent decline in their functional status and demonstrates the ability to make significant improvements in function in a reasonable and predictable amount of time.     Equipment Recommendations   Tub/shower bench;BSC/3in1      Precautions/Restrictions   Precautions Precautions: Fall Recall of Precautions/Restrictions: Intact Required Braces or Orthoses: Sling (RUE) Restrictions Weight Bearing Restrictions Per Provider Order: Yes RUE Weight Bearing Per Provider Order: Non weight bearing RLE Weight Bearing Per Provider Order: Weight bearing as tolerated     Mobility Bed Mobility Overal bed mobility: Needs Assistance Bed Mobility: Supine to Sit, Sit to Supine Supine to sit: Mod assist Sit to supine: Min assist General bed mobility comments: Patient pulled self up to sitting; assist to bring hips perpendicular to EOB and anteriorly to facilitate feet on floor.  Assist to bring legs back to bed and for scoot to [facility]    Transfers General transfer comment: OOB activities not attempted due to hypertension      Balance Overall balance assessment: Mild deficits observed, not formally tested Sitting-balance support: Feet supported Sitting balance-Leahy Scale: Fair Sitting balance - Comments: Patient initially used LUE for support at EOB; then unsupported as time passed   Standing balance comment: Not tested due to hypertension     ADL either performed or assessed with clinical judgement   ADL Overall ADL's : Needs assistance/impaired Eating/Feeding: Set up;Sitting;Bed level   Grooming: Minimal assistance;Sitting   Upper Body Bathing: Minimal assistance;Sitting   Lower Body Bathing: Moderate assistance;Sitting/lateral leans   Upper Body Dressing : Maximal assistance;Sitting   Lower Body Dressing: Maximal assistance;Sitting/lateral leans   Toilet Transfer: Moderate assistance;BSC/3in1;Stand-pivot   Toileting- Clothing Manipulation and Hygiene: Maximal  assistance;+2 for physical assistance;Sit  to/from stand       Vision Baseline Vision/History: 0 No visual deficits;1 Wears glasses Ability to See in Adequate Light: 0 Adequate Patient Visual Report: No change from baseline Vision Assessment?: No apparent visual deficits            Pertinent Vitals/Pain Pain Assessment Pain Assessment: No/denies pain     Extremity/Trunk Assessment Upper Extremity Assessment Upper Extremity Assessment: Right hand dominant;RUE deficits/detail;LUE deficits/detail RUE Deficits / Details: Right proximal humeral Fx; NWB with sling RUE: Unable to fully assess due to immobilization LUE Deficits / Details: AROM WFL; gross strength 4-/5 LUE Sensation: WNL LUE Coordination: WNL   Lower Extremity Assessment Lower Extremity Assessment: Defer to PT evaluation   Cervical / Trunk Assessment Cervical / Trunk Assessment: Normal   Communication Communication Communication: No apparent difficulties   Cognition Arousal: Alert Behavior During Therapy: WFL for tasks assessed/performed Cognition: No apparent impairments OT - Cognition Comments: Patient appears to be at reported baseline Following commands: Intact       General Comments   BP 146/56 upon arrival supine; after transitioning to EOB, BP was 171/98, 169/104, and 178/105.  RN notified           Home Living Family/patient expects to be discharged to:: Private residence Living Arrangements: Spouse/significant other Available Help at Discharge: Family;Available PRN/intermittently Type of Home: House Home Access: Stairs to enter [employer] of Steps: 1 Entrance Stairs-Rails: None Home Layout: One level   Bathroom Shower/Tub: Engineer, [employer] Systems: Standard Bathroom Accessibility: Yes   Home Equipment: Rollator (4 wheels);Cane - single point;Hand held shower head;Adaptive equipment;Shower Environmental Education Officer Equipment: Reacher;Sock aid Additional Comments: Patient endorsed  difficulty with tub transfers even prior to fall & Fx      Prior Functioning/Environment Prior Level of Function : Independent/Modified Independent;Driving   Mobility Comments: Independent with household & community mobility ADLs Comments: Independent with self-care, cooking, housekeeping    OT Problem List: Decreased strength;Decreased activity tolerance;Impaired balance (sitting and/or standing)   OT Treatment/Interventions: Self-care/ADL training;Therapeutic exercise;Energy conservation;Therapeutic activities;Patient/family education      OT Goals(Current goals can be found in the care plan section)   Acute Rehab OT Goals Patient Stated Goal: Get better and return to rehab OT Goal Formulation: With patient Time For Goal Achievement: 11/02/24 Potential to Achieve Goals: Good ADL Goals Pt Will Perform Grooming: with modified independence;sitting (while demonstrating compensatory one-handed strategies) Pt Will Perform Upper Body Dressing: with min assist;sitting (while demonstrating compensatory one-handed strategies) Pt Will Transfer to Toilet: bedside commode;stand pivot transfer;with contact guard assist Pt Will Perform Toileting - Clothing Manipulation and hygiene: with min assist;with adaptive equipment;sit to/from stand (while demonstrating compensatory one-handed strategies)   OT Frequency:  Min 2X/week       AM-PAC OT 6 Clicks Daily Activity     Outcome Measure Help from another person eating meals?: A Little Help from another person taking care of personal grooming?: A Little Help from another person toileting, which includes using toliet, bedpan, or urinal?: A Lot Help from another person bathing (including washing, rinsing, drying)?: A Lot Help from another person to put on and taking off regular upper body clothing?: A Little Help from another person to put on and taking off regular lower body clothing?: A Lot 6 Click Score: 15   End of Session Nurse  Communication: Other (comment) (BP status)  Activity Tolerance: Treatment limited secondary to medical complications (Comment) (Hyptertension) Patient left: in bed;with call bell/phone within reach;with bed alarm set  OT Visit Diagnosis: Unsteadiness  on feet (R26.81);Muscle weakness (generalized) (M62.81);History of falling (Z91.81)                Time: 8754-8685 OT Time Calculation (min): 29 min Charges:  OT General Charges $OT Visit: 1 Visit OT Evaluation $OT Eval Moderate Complexity: 1 Mod OT Treatments $Self Care/Home Management : 8-22 mins  Alfie Rideaux B. Solveig Fangman, MS, OTR/L 10/19/2024, 2:06 PM

## 2024-10-19 NOTE — TOC Progression Note (Signed)
 Transition of Care [facility]) - Progression Note   Patient Details  Name: Jody Wilson MRN: [id number] Date of Birth: 12-10-1951  Transition of [facility]) CM/SW Contact  Duwaine GORMAN Aran, LCSW Phone Number: 10/19/2024, 2:23 PM  Clinical Narrative: PT/OT evaluations recommended SNF. CSW spoke with son, [name], and he requested the referral be faxed to [facility] and [facility] as these would be closer for family to visit. FL2 done; PASRR confirmed. Initial referral faxed out. Care management awaiting bed offers.  Expected Discharge Plan: Skilled Nursing Facility Barriers to Discharge: Continued Medical Work up  Expected Discharge Plan and Services In-house Referral: Clinical Social Work Post Acute Care Choice: Skilled Nursing Facility Living arrangements for the past 2 months: Single Family Home              DME Arranged: N/A DME Agency: NA  Social Drivers of Health (SDOH) Interventions SDOH Screenings   Food Insecurity: No Food Insecurity (10/16/2024)  Housing: Low Risk  (10/16/2024)  Transportation Needs: No Transportation Needs (10/16/2024)  Utilities: Not At Risk (10/16/2024)  Alcohol Screen: Low Risk  (12/27/2023)  Depression (PHQ2-9): Low Risk  (07/24/2024)  Recent Concern: Depression (PHQ2-9) - High Risk (07/06/2024)  Financial Resource Strain: Low Risk  (12/27/2023)  Physical Activity: Inactive (12/27/2023)  Social Connections: Moderately Isolated (10/16/2024)  Stress: No Stress Concern Present (12/27/2023)  Tobacco Use: Medium Risk (10/16/2024)  Health Literacy: Adequate Health Literacy (12/27/2023)   Readmission Risk Interventions    04/13/2024    9:01 AM  Readmission Risk Prevention Plan  Medication Screening Complete  Transportation Screening Complete

## 2024-10-19 NOTE — [place] FL2 (Signed)
 [place]  MEDICAID FL2 LEVEL OF CARE FORM     IDENTIFICATION  Patient Name: Jody Wilson Birthdate: 30-May-1951 Sex: female Admission Date (Current Location): 10/16/2024  [facility] and [place] Number:  Producer, Television/film/video and Address:  [facility],  [address], [place] [address]      Provider Number: [id number]  Attending Physician Name and Address:  Will Almarie MATSU, MD  Relative Name and Phone Number:  Toneshia Coello (son) Ph: [phone]    Current Level of Care: Hospital Recommended Level of Care: Skilled Nursing Facility Prior Approval Number:    Date Approved/Denied:   PASRR Number: [id number]  Discharge Plan: SNF    Current Diagnoses: Patient Active Problem List   Diagnosis Date Noted   Dehydration 10/18/2024   Community acquired pneumonia 10/16/2024   Altered mental status, unspecified 10/16/2024   History of bradycardia 10/16/2024   Sepsis (HCC) 10/16/2024   Closed fracture of proximal end of right humerus 10/03/2024   Closed fracture of right inferior pubic ramus (HCC) 10/03/2024   Sinus bradycardia 10/03/2024   NSTEMI (non-ST elevated myocardial infarction) (HCC) 06/29/2024   Syncope 06/27/2024   Elevated troponin 06/27/2024   Acute respiratory failure with hypoxia (HCC) 06/27/2024   Abnormal CT of liver 06/26/2024   Incontinence of feces 05/08/2024   Closed fracture of neck of left femur (HCC) 04/13/2024   Hypokalemia 04/13/2024   Closed hip fracture requiring operative repair with routine healing 02/09/2023   Pulmonary nodule 02/09/2023   Hypertriglyceridemia 01/22/2022   Abnormal findings on diagnostic imaging of liver and biliary tract 12/09/2021   Diarrhea 12/09/2021   History of colonic polyps 12/09/2021   Anxiety and depression 06/23/2020   Hiatal hernia 06/23/2020   Thoracic aortic atherosclerosis 06/23/2020   Smoking history 04/25/2020   Involuntary muscle jerks while sleeping 09/26/2019   B12 deficiency  09/25/2019   Iron deficiency anemia 09/25/2019   Elevated liver function tests 09/07/2019   Osteoporosis 02/09/2017   History of vertebral fracture 02/09/2017   GERD (gastroesophageal reflux disease) 03/11/2016   Hyperlipidemia 02/19/2015   Essential hypertension 03/18/2014    Orientation RESPIRATION BLADDER Height & Weight     Self, Time, Situation, Place  O2 (2L/min) Continent Weight: 133 lb 6.1 oz (60.5 kg) Height:  5' 3 (160 cm)  BEHAVIORAL SYMPTOMS/MOOD NEUROLOGICAL BOWEL NUTRITION STATUS      Continent Diet (Heart healthy diet)  AMBULATORY STATUS COMMUNICATION OF NEEDS Skin   Extensive Assist Verbally Other (Comment) (Erythema: groin, buttocks; Ecchymosis: back, bilateral shoulders, arms, & legs)                       Personal Care Assistance Level of Assistance  Bathing, Feeding, Dressing Bathing Assistance: Limited assistance Feeding assistance: Independent Dressing Assistance: Limited assistance     Functional Limitations Info  Sight, Hearing, Speech Sight Info: Impaired Hearing Info: Adequate Speech Info: Adequate    SPECIAL CARE FACTORS FREQUENCY  PT (By licensed PT), OT (By licensed OT)     PT Frequency: 5x's/week OT Frequency: 5x's/week            Contractures Contractures Info: Not present    Additional Factors Info  Code Status, Allergies Code Status Info: Full Allergies Info: Tetracyclines & Related           Current Medications (10/19/2024):  This is the current hospital active medication list Current Facility-Administered Medications  Medication Dose Route Frequency Provider Last Rate Last Admin   acetaminophen  (TYLENOL ) tablet 650 mg  650 mg Oral Q6H PRN Sundil, Subrina, MD   650 mg at 10/19/24 0815   Or   acetaminophen  (TYLENOL ) suppository 650 mg  650 mg Rectal Q6H PRN Sundil, Subrina, MD       aspirin  EC tablet 81 mg  81 mg Oral Daily Sundil, Subrina, MD   81 mg at 10/19/24 0739   azithromycin  (ZITHROMAX ) 500 mg in sodium  chloride 0.9 % 250 mL IVPB  500 mg Intravenous Q24H Will Almarie MATSU, MD 250 mL/hr at 10/18/24 2146 500 mg at 10/18/24 2146   buPROPion  (WELLBUTRIN  XL) 24 hr tablet 150 mg  150 mg Oral Daily Sundil, Subrina, MD   150 mg at 10/19/24 9261   carvedilol  (COREG ) tablet 12.5 mg  12.5 mg Oral BID WC Mathews, Elizabeth G, MD       cefTRIAXone (ROCEPHIN) 2 g in sodium chloride  0.9 % 100 mL IVPB  2 g Intravenous Q24H Will Almarie MATSU, MD 200 mL/hr at 10/18/24 2146 2 g at 10/18/24 2146   citalopram  (CELEXA ) tablet 40 mg  40 mg Oral Daily Sundil, Subrina, MD   40 mg at 10/19/24 0735   docusate sodium  (COLACE) capsule 100 mg  100 mg Oral BID Sundil, Subrina, MD   100 mg at 10/19/24 9261   enoxaparin  (LOVENOX ) injection 40 mg  40 mg Subcutaneous Q24H Sundil, Subrina, MD   40 mg at 10/18/24 2139   feeding supplement (ENSURE PLUS HIGH PROTEIN) liquid 237 mL  237 mL Oral BID BM Will Almarie MATSU, MD   237 mL at 10/19/24 1413   haloperidol (HALDOL) tablet 5 mg  5 mg Oral Q6H PRN Will Almarie MATSU, MD       Or   haloperidol lactate (HALDOL) injection 1 mg  1 mg Intramuscular Q6H PRN Mathews, Elizabeth G, MD   1 mg at 10/17/24 2135   hydrALAZINE  (APRESOLINE ) injection 5 mg  5 mg Intravenous Q8H PRN Sundil, Subrina, MD       irbesartan  (AVAPRO ) tablet 150 mg  150 mg Oral Daily Mathews, Elizabeth G, MD   150 mg at 10/19/24 0743   ketorolac  (TORADOL ) 15 MG/ML injection 15 mg  15 mg Intravenous Q8H PRN Mathews, Elizabeth G, MD   15 mg at 10/19/24 1413   metoprolol tartrate (LOPRESSOR) injection 5 mg  5 mg Intravenous Q6H PRN Mathews, Elizabeth G, MD   5 mg at 10/17/24 1511   ondansetron  (ZOFRAN ) tablet 4 mg  4 mg Oral Q6H PRN Sundil, Subrina, MD       Or   ondansetron  (ZOFRAN ) injection 4 mg  4 mg Intravenous Q6H PRN Sundil, Subrina, MD       pantoprazole  (PROTONIX ) EC tablet 40 mg  40 mg Oral BID AC Sundil, Subrina, MD   40 mg at 10/19/24 0736   polyethylene glycol (MIRALAX  / GLYCOLAX ) packet 17 g  17 g  Oral Daily PRN Sundil, Subrina, MD       potassium chloride  10 mEq in 100 mL IVPB  10 mEq Intravenous Q1 Hr x 3 Utomwen, Adesuwa, RPH 100 mL/hr at 10/19/24 1403 10 mEq at 10/19/24 1403   potassium chloride  SA (KLOR-CON  M) CR tablet 40 mEq  40 mEq Oral BID Will Almarie MATSU, MD   40 mEq at 10/19/24 1026   pravastatin  (PRAVACHOL ) tablet 40 mg  40 mg Oral QPM Sundil, Subrina, MD   40 mg at 10/18/24 1839   QUEtiapine (SEROQUEL) tablet 50 mg  50 mg Oral QHS Will Almarie MATSU, MD  50 mg at 10/18/24 2140   sodium chloride  flush (NS) 0.9 % injection 3 mL  3 mL Intravenous Q12H Sundil, Subrina, MD   3 mL at 10/19/24 0744   sodium chloride  flush (NS) 0.9 % injection 3 mL  3 mL Intravenous PRN Sundil, Subrina, MD         Discharge Medications: Please see discharge summary for a list of discharge medications.  Relevant Imaging Results:  Relevant Lab Results:   Additional Information SSN: [id number]  Duwaine GORMAN Aran, LCSW

## 2024-10-20 DIAGNOSIS — E860 Dehydration: Secondary | ICD-10-CM | POA: Diagnosis not present

## 2024-10-20 LAB — BASIC METABOLIC PANEL WITH GFR
Anion gap: 9 (ref 5–15)
BUN: 17 mg/dL (ref 8–23)
CO2: 25 mmol/L (ref 22–32)
Calcium: 9.4 mg/dL (ref 8.9–10.3)
Chloride: 106 mmol/L (ref 98–111)
Creatinine, Ser: 0.52 mg/dL (ref 0.44–1.00)
GFR, Estimated: >60 mL/min (ref >60)
Glucose, Bld: 117 mg/dL — ABNORMAL HIGH (ref 70–99)
Potassium: 4.8 mmol/L (ref 3.5–5.1)
Sodium: 140 mmol/L (ref 135–145)

## 2024-10-20 MED ORDER — AZITHROMYCIN 250 MG PO TABS
500.0000 mg | ORAL_TABLET | Freq: Every day | ORAL | Status: AC
  Administered 2024-10-20: 500 mg via ORAL
  Filled 2024-10-20: qty 2

## 2024-10-20 MED ORDER — ACETAMINOPHEN 500 MG PO TABS
1000.0000 mg | ORAL_TABLET | Freq: Three times a day (TID) | ORAL | Status: DC
  Administered 2024-10-20 – 2024-10-22 (×7): 1000 mg via ORAL
  Filled 2024-10-20 (×7): qty 2

## 2024-10-20 MED ORDER — TRAMADOL HCL 50 MG PO TABS
50.0000 mg | ORAL_TABLET | Freq: Four times a day (QID) | ORAL | Status: DC | PRN
  Administered 2024-10-20 – 2024-10-22 (×7): 50 mg via ORAL
  Filled 2024-10-20 (×7): qty 1

## 2024-10-20 NOTE — Progress Notes (Signed)
 PROGRESS NOTE    Jody Wilson  FMW:[id number] DOB: [birth year] DOA: 10/16/2024 PCP: Dettinger, Fonda LABOR, MD  Brief Narrative: This patient was admitted from [facility] nursing home on 10/16/2024 with complaints of confusion hallucination seeing her dead mother in the room.  She was recently admitted to the hospital on the 15th of this month after a fall and was found to have right humerus closed fracture right pubic ramus fracture Ortho recommended conservative management and she was discharged to skilled nursing facility on the 22nd.  Past medical history significant for anxiety depression GERD sinus bradycardia hypertension orthostatic hypotension iron deficiency anemia history of vertebral fracture.  At the time of admission she also complained of dysuria her UA came back negative for urinary tract infection. At the time of admission she was hypothermic with leukocytosis and normal lactate level. CT head showed no acute abnormalities. CT chest and abdomen and pelvis were done-. A cluster of nodular density in the right upper lobe, likely post inflammatory versus atypical pneumonia. Clinical correlation is recommended. Large hiatal hernia containing the majority of the stomach and a segment of the transverse colon. No bowel obstruction. Normal appendix.Displaced fractures of the right anterior and inferior pubic rami as well as insufficiency fractures of bilateral sacral ala. Normal TSH, normal ammonia  Assessment & Plan:   Principal Problem:   Community acquired pneumonia Active Problems:   Altered mental status, unspecified   Essential hypertension   Hyperlipidemia   GERD (gastroesophageal reflux disease)   Iron deficiency anemia   Anxiety and depression   Closed fracture of neck of left femur (HCC)   Closed fracture of right inferior pubic ramus (HCC)   History of bradycardia   Sepsis (HCC)   Dehydration   #1 acute metabolic encephalopathy in the setting of hypoxia,  community-acquired pneumonia.  At baseline her mental status she is able to have a conversation and follow conversation she is not confused she is able to do her ADLs.  Her mental status is improved with stopping narcotics avoiding Ativan  excetra. She was found to be hypoxic in the ED and was placed on 2 L of oxygen.  Chest x-ray was concerning for consolidation. On Rocephin and azithromycin .  #2 Sepsis secondary to CAP on admission she was hypothermic tachycardic with leukocytosis and hypoxia.  She was initially placed on vancomycin  and cefepime.  With ongoing change in mental status febrile antibiotics were stopped and she was started on Rocephin and azithromycin .(Cefepime side effects neurotoxicity) MRSA PCR negative.  WBCs trending down.  #3 hypertension blood pressure improved after restarting Coreg  and olmesartan .    #4 depression and anxiety continue Celexa  and Wellbutrin  Restart Xanax .  #5 history of recent fracture of the right pubic ramus, nondisplaced right sacral alar and closed fracture of the proximal right humerus she has follow-up appointment with Ortho on normal 11th    Tylenol  and Toradol  for pain control. Added Ultram  for pain control  #6 hypokalemia potassium 2.9 mag 1.9 replete potassium.  Recheck labs in AM.  Estimated body mass index is 23.63 kg/m as calculated from the following:   Height as of this encounter: 5' 3 (1.6 m).   Weight as of this encounter: 60.5 kg.  DVT prophylaxis: Lovenox  Code Status: Full code Family Communication: None  disposition Plan:  Status is: Inpatient Remains inpatient appropriate because: Acute illness   Consultants: none  Procedures: none Antimicrobials: rocephin and azithro  Subjective:  Resting in bed reports 8 out of 10 pain without moving backed  off on many of her narcotics due to delirium   Objective: Vitals:   10/19/24 1631 10/20/24 0000 10/20/24 0100 10/20/24 0448  BP: (!) 161/89 (!) 147/94 (!) 144/87 136/66   Pulse: 85 94 80 74  Resp:  (!) 28 20 20   Temp:    98.6 F (37 C)  TempSrc:    Oral  SpO2:  99% 100% 100%  Weight:      Height:        Intake/Output Summary (Last 24 hours) at 10/20/2024 1108 Last data filed at 10/20/2024 1017 Gross per 24 hour  Intake 1221.31 ml  Output 650 ml  Net 571.31 ml   Filed Weights   10/16/24 2303  Weight: 60.5 kg    Examination:  General exam: Appears awake and alert Respiratory system: Few rhonchi. Respiratory effort normal. Cardiovascular system: Tachycardic  gastrointestinal system: Abdomen is nondistended, soft and nontender. No organomegaly or masses felt. Normal bowel sounds heard. Central nervous system: Alert and oriented. No focal neurological deficits. Extremities: No edema.     Data Reviewed: I have personally reviewed following labs and imaging studies  CBC: Recent Labs  Lab 10/16/24 1616 10/17/24 0404 10/18/24 1319 10/19/24 0405  WBC 14.0* 11.9* 10.7* 7.8  NEUTROABS 11.4*  --   --   --   HGB 12.2 12.0 11.8* 10.8*  HCT 36.7 37.6 36.4 32.5*  MCV 97.1 100.5* 99.5 100.9*  PLT 496* 513* 634* 531*   Basic Metabolic Panel: Recent Labs  Lab 10/16/24 1616 10/17/24 0404 10/17/24 0945 10/18/24 0425 10/18/24 1319 10/19/24 0405  NA 134* 136  --   --  139 141  K 3.0* 3.0*  --   --  3.6 2.9*  CL 97* 103  --   --  104 104  CO2 24 24  --   --  16* 18*  GLUCOSE 123* 94  --   --  93 76  BUN 12 10  --   --  16 16  CREATININE 0.62 0.53  --   --  0.53 0.58  CALCIUM  9.8 9.1  --   --  9.6 8.9  MG  --   --  1.8 1.9  --   --    GFR: Estimated Creatinine Clearance: 52.6 mL/min (by C-G formula based on SCr of 0.58 mg/dL). Liver Function Tests: Recent Labs  Lab 10/16/24 1616 10/17/24 0404 10/18/24 1319 10/19/24 0405  AST 20 20 18  13*  ALT 13 13 14 9   ALKPHOS 182* 179* 195* 172*  BILITOT 0.9 0.8 0.7 0.5  PROT 7.2 6.9 6.9 6.0*  ALBUMIN 3.8 3.5 3.6 3.1*   No results for input(s): LIPASE, AMYLASE in the last 168  hours. Recent Labs  Lab 10/17/24 0404  AMMONIA 28   Coagulation Profile: No results for input(s): INR, PROTIME in the last 168 hours. Cardiac Enzymes: Recent Labs  Lab 10/17/24 1640  CKTOTAL 206   BNP (last 3 results) No results for input(s): PROBNP in the last 8760 hours. HbA1C: No results for input(s): HGBA1C in the last 72 hours. CBG: Recent Labs  Lab 10/16/24 1543 10/17/24 1117  GLUCAP 129* 119*   Lipid Profile: No results for input(s): CHOL, HDL, LDLCALC, TRIG, CHOLHDL, LDLDIRECT in the last 72 hours. Thyroid  Function Tests: No results for input(s): TSH, T4TOTAL, FREET4, T3FREE, THYROIDAB in the last 72 hours.  Anemia Panel: No results for input(s): VITAMINB12, FOLATE, FERRITIN, TIBC, IRON, RETICCTPCT in the last 72 hours. Sepsis Labs: Recent Labs  Lab 10/16/24 1641 10/17/24  0404  PROCALCITON  --  0.10  LATICACIDVEN 1.1  --     Recent Results (from the past 240 hours)  Culture, blood (Routine X 2) w Reflex to ID Panel     Status: None (Preliminary result)   Collection Time: 10/16/24  4:14 PM   Specimen: BLOOD LEFT WRIST  Result Value Ref Range Status   Specimen Description   Final    BLOOD LEFT WRIST Performed at [facility], [address] [place], [place] [address]    Special Requests   Final    BOTTLES DRAWN AEROBIC AND ANAEROBIC Blood Culture adequate volume Performed at [facility], [address], [place], [place] [address]    Culture   Final    NO GROWTH 4 DAYS Performed at [facility], [address] [place], [place] [address]    Report Status PENDING  Incomplete  Culture, blood (Routine X 2) w Reflex to ID Panel     Status: None (Preliminary result)   Collection Time: 10/16/24  4:16 PM   Specimen: BLOOD RIGHT ARM  Result Value Ref Range Status   Specimen Description   Final    BLOOD RIGHT ARM Performed at [facility], [address], [place], [place]  [address]    Special Requests   Final    BOTTLES DRAWN AEROBIC AND ANAEROBIC Blood Culture results may not be optimal due to an inadequate volume of blood received in culture bottles Performed at [facility], [address] [place], [place] [address]    Culture   Final    NO GROWTH 4 DAYS Performed at [facility], [address], [place], [place] [address]    Report Status PENDING  Incomplete  MRSA Next Gen by PCR, Nasal     Status: None   Collection Time: 10/17/24  1:40 AM   Specimen: Urine, Clean Catch; Nasal Swab  Result Value Ref Range Status   MRSA by PCR Next Gen NOT DETECTED NOT DETECTED Final    Comment: (NOTE) The GeneXpert MRSA Assay (FDA approved for NASAL specimens only), is one component of a comprehensive MRSA colonization surveillance program. It is not intended to diagnose MRSA infection nor to guide or monitor treatment for MRSA infections. Test performance is not FDA approved in patients less than 28 years old. Performed at [facility], [address], [place], [place] [address]   Respiratory (~20 pathogens) panel by PCR     Status: None   Collection Time: 10/17/24  2:00 AM   Specimen: Nasopharyngeal Swab; Respiratory  Result Value Ref Range Status   Adenovirus NOT DETECTED NOT DETECTED Final   Coronavirus 229E NOT DETECTED NOT DETECTED Final    Comment: (NOTE) The Coronavirus on the Respiratory Panel, DOES NOT test for the novel  Coronavirus (2019 nCoV)    Coronavirus HKU1 NOT DETECTED NOT DETECTED Final   Coronavirus NL63 NOT DETECTED NOT DETECTED Final   Coronavirus OC43 NOT DETECTED NOT DETECTED Final   Metapneumovirus NOT DETECTED NOT DETECTED Final   Rhinovirus / Enterovirus NOT DETECTED NOT DETECTED Final   Influenza A NOT DETECTED NOT DETECTED Final   Influenza B NOT DETECTED NOT DETECTED Final   Parainfluenza Virus 1 NOT DETECTED NOT DETECTED Final   Parainfluenza Virus 2 NOT DETECTED NOT DETECTED Final    Parainfluenza Virus 3 NOT DETECTED NOT DETECTED Final   Parainfluenza Virus 4 NOT DETECTED NOT DETECTED Final   Respiratory Syncytial  Virus NOT DETECTED NOT DETECTED Final   Bordetella pertussis NOT DETECTED NOT DETECTED Final   Bordetella Parapertussis NOT DETECTED NOT DETECTED Final   Chlamydophila pneumoniae NOT DETECTED NOT DETECTED Final   Mycoplasma pneumoniae NOT DETECTED NOT DETECTED Final    Comment: Performed at [facility], [address] [place], [place] [address]         Radiology Studies: EEG adult Result Date: 10/18/2024 Shelton Arlin KIDD, MD     10/18/2024  4:57 PM Patient Name: AKILA BATTA MRN: [id number] Epilepsy Attending: Arlin KIDD Shelton Referring Physician/Provider: Will Almarie MATSU, MD Date: 10/18/2024 Duration: 22.43 mins Patient history: 72yo f with ams. EEG to evaluate for seizure Level of alertness: Awake, asleep AEDs during EEG study: None Technical aspects: This EEG study was done with scalp electrodes positioned according to the 10-20 International system of electrode placement. Electrical activity was reviewed with band pass filter of 1-70Hz , sensitivity of 7 uV/mm, display speed of 23mm/sec with a 60Hz  notched filter applied as appropriate. EEG data were recorded continuously and digitally stored.  Video monitoring was available and reviewed as appropriate. Description: The posterior dominant rhythm consists of 8 Hz activity of moderate voltage (25-35 uV) seen predominantly in posterior head regions, symmetric and reactive to eye opening and eye closing. Sleep was characterized by vertex waves, sleep spindles (12 to 14 Hz), maximal frontocentral region. Physiologic photic driving was not seen during photic stimulation. Hyperventilation was not performed.   IMPRESSION: This study is within normal limits. No seizures or epileptiform discharges were seen throughout the recording. A normal interictal EEG does not exclude the diagnosis of epilepsy.  Arlin KIDD Shelton   MR BRAIN W WO CONTRAST Result Date: 10/18/2024 EXAM: MRI BRAIN WITH AND WITHOUT CONTRAST 10/18/2024 12:24:58 PM TECHNIQUE: Multiplanar multisequence MRI of the head/brain was performed with and without the administration of 6 mL gadobutrol  (GADAVIST ) 1 MMOL/ML injection. COMPARISON: 09/07/2022 CLINICAL HISTORY: Mental status change, unknown cause. FINDINGS: BRAIN AND VENTRICLES: No acute infarct. No acute intracranial hemorrhage. No mass effect or midline shift. No hydrocephalus. The sella is unremarkable. Normal flow voids. No mass or abnormal enhancement. Multifocal hyperintense T2-weighted signal within the cerebral white matter, most commonly due to chronic small vessel disease. ORBITS: No acute abnormality. SINUSES: No acute abnormality. BONES AND SOFT TISSUES: Normal bone marrow signal and enhancement. No acute soft tissue abnormality. IMPRESSION: 1. No acute intracranial abnormality. 2. Multifocal T2 hyperintense white matter signal most consistent with chronic small vessel disease. Electronically signed by: Kevin Herman MD 10/18/2024 12:46 PM EDT RP Workstation: [id number]    Scheduled Meds:  acetaminophen   1,000 mg Oral TID   aspirin  EC  81 mg Oral Daily   azithromycin   500 mg Oral QHS   buPROPion   150 mg Oral Daily   carvedilol   12.5 mg Oral BID WC   citalopram   40 mg Oral Daily   docusate sodium   100 mg Oral BID   enoxaparin  (LOVENOX ) injection  40 mg Subcutaneous Q24H   feeding supplement  237 mL Oral BID BM   irbesartan   150 mg Oral Daily   pantoprazole   40 mg Oral BID AC   pravastatin   40 mg Oral QPM   QUEtiapine  50 mg Oral QHS   sodium chloride  flush  3 mL Intravenous Q12H   Continuous Infusions:  cefTRIAXone (ROCEPHIN)  IV 2 g (10/19/24 2129)     LOS: 4 days    Almarie MATSU Will, MD  10/20/2024, 11:08 AM

## 2024-10-21 DIAGNOSIS — E876 Hypokalemia: Secondary | ICD-10-CM | POA: Diagnosis not present

## 2024-10-21 LAB — CBC
HCT: 40.5 % (ref 36.0–46.0)
Hemoglobin: 12.6 g/dL (ref 12.0–15.0)
MCH: 32.4 pg (ref 26.0–34.0)
MCHC: 31.1 g/dL (ref 30.0–36.0)
MCV: 104.1 fL — ABNORMAL HIGH (ref 80.0–100.0)
Platelets: 490 K/uL — ABNORMAL HIGH (ref 150–400)
RBC: 3.89 MIL/uL (ref 3.87–5.11)
RDW: 13.7 % (ref 11.5–15.5)
WBC: 8.3 K/uL (ref 4.0–10.5)
nRBC: 0 % (ref 0.0–0.2)

## 2024-10-21 LAB — BASIC METABOLIC PANEL WITH GFR
Anion gap: 9 (ref 5–15)
BUN: 12 mg/dL (ref 8–23)
CO2: 23 mmol/L (ref 22–32)
Calcium: 9 mg/dL (ref 8.9–10.3)
Chloride: 103 mmol/L (ref 98–111)
Creatinine, Ser: 0.4 mg/dL — ABNORMAL LOW (ref 0.44–1.00)
GFR, Estimated: >60 mL/min (ref >60)
Glucose, Bld: 107 mg/dL — ABNORMAL HIGH (ref 70–99)
Potassium: 3.9 mmol/L (ref 3.5–5.1)
Sodium: 135 mmol/L (ref 135–145)

## 2024-10-21 LAB — CULTURE, BLOOD (ROUTINE X 2)
Culture: NO GROWTH
Culture: NO GROWTH
Special Requests: ADEQUATE

## 2024-10-21 MED ORDER — CYCLOBENZAPRINE HCL 5 MG PO TABS
5.0000 mg | ORAL_TABLET | Freq: Three times a day (TID) | ORAL | Status: DC
  Administered 2024-10-21 – 2024-10-22 (×5): 5 mg via ORAL
  Filled 2024-10-21 (×5): qty 1

## 2024-10-21 NOTE — Progress Notes (Signed)
 PROGRESS NOTE    Jody Wilson  FMW:[id number] DOB: 06-Jan-1951 DOA: 10/16/2024 PCP: Dettinger, Fonda LABOR, MD  Brief Narrative: This patient was admitted from [facility] nursing home on 10/16/2024 with complaints of confusion hallucination seeing her dead mother in the room.  She was recently admitted to the hospital on the 15th of this month after a fall and was found to have right humerus closed fracture right pubic ramus fracture Ortho recommended conservative management and she was discharged to skilled nursing facility on the 22nd.  Past medical history significant for anxiety depression GERD sinus bradycardia hypertension orthostatic hypotension iron deficiency anemia history of vertebral fracture.  At the time of admission she also complained of dysuria her UA came back negative for urinary tract infection. At the time of admission she was hypothermic with leukocytosis and normal lactate level. CT head showed no acute abnormalities. CT chest and abdomen and pelvis were done-. A cluster of nodular density in the right upper lobe, likely post inflammatory versus atypical pneumonia. Clinical correlation is recommended. Large hiatal hernia containing the majority of the stomach and a segment of the transverse colon. No bowel obstruction. Normal appendix.Displaced fractures of the right anterior and inferior pubic rami as well as insufficiency fractures of bilateral sacral ala. Normal TSH, normal ammonia  Assessment & Plan:   Principal Problem:   Community acquired pneumonia Active Problems:   Altered mental status, unspecified   Essential hypertension   Hyperlipidemia   GERD (gastroesophageal reflux disease)   Iron deficiency anemia   Anxiety and depression   Closed fracture of neck of left femur (HCC)   Closed fracture of right inferior pubic ramus (HCC)   History of bradycardia   Sepsis (HCC)   Dehydration   #1 acute metabolic encephalopathy resolved  in the setting of  hypoxia, community-acquired pneumonia.  At baseline her mental status she is able to have a conversation and follow conversation she is not confused she is able to do her ADLs.  Her mental status is improved with stopping narcotics avoiding Ativan  excetra. She was found to be hypoxic in the ED and was placed on 2 L of oxygen.  Chest x-ray was concerning for consolidation. On Rocephin and azithromycin .  #2 Sepsis secondary to CAP on admission she was hypothermic tachycardic with leukocytosis and hypoxia.  She was initially placed on vancomycin  and cefepime.  With ongoing change in mental status febrile antibiotics were stopped and she was started on Rocephin and azithromycin .(Cefepime side effects neurotoxicity) MRSA PCR negative.  WBCs trending down.  #3 hypertension blood pressure improved after restarting Coreg  and olmesartan .    #4 depression and anxiety continue Celexa  and Wellbutrin  Restart Xanax .  #5 history of recent fracture of the right pubic ramus, nondisplaced right sacral alar and closed fracture of the proximal right humerus she has follow-up appointment with Ortho on normal 11th    Tylenol  and Toradol  for pain control. Added Ultram  for pain control  #6 hypokalemia resolved   Estimated body mass index is 23.63 kg/m as calculated from the following:   Height as of this encounter: 5' 3 (1.6 m).   Weight as of this encounter: 60.5 kg.  DVT prophylaxis: Lovenox  Code Status: Full code Family Communication: None  disposition Plan:  Status is: Inpatient Remains inpatient appropriate because: Acute illness   Consultants: none  Procedures: none Antimicrobials: rocephin and azithro  Subjective:  Has sharp pain across the buttocks with recent pubic rami fracture was started on Ultram  yesterday due to ongoing  complaints of pain and careful about narcotics adding Flexeril  today   Objective: Vitals:   10/20/24 0448 10/20/24 1214 10/20/24 2100 10/21/24 0441  BP: 136/66 (!)  140/88  (!) 155/8  Pulse: 74 73    Resp: 20 18    Temp: 98.6 F (37 C) 98.3 F (36.8 C) 98.4 F (36.9 C) 98.6 F (37 C)  TempSrc: Oral Oral Oral Oral  SpO2: 100% 100% 100% 100%  Weight:      Height:        Intake/Output Summary (Last 24 hours) at 10/21/2024 0918 Last data filed at 10/21/2024 0850 Gross per 24 hour  Intake 503 ml  Output 500 ml  Net 3 ml   Filed Weights   10/16/24 2303  Weight: 60.5 kg    Examination:  General exam: Appears awake and alert Respiratory system: Few rhonchi. Respiratory effort normal. Cardiovascular system: Tachycardic  gastrointestinal system: Abdomen is nondistended, soft and nontender. No organomegaly or masses felt. Normal bowel sounds heard. Central nervous system: Alert and oriented. No focal neurological deficits. Extremities: No edema.     Data Reviewed: I have personally reviewed following labs and imaging studies  CBC: Recent Labs  Lab 10/16/24 1616 10/17/24 0404 10/18/24 1319 10/19/24 0405 10/21/24 0333  WBC 14.0* 11.9* 10.7* 7.8 8.3  NEUTROABS 11.4*  --   --   --   --   HGB 12.2 12.0 11.8* 10.8* 12.6  HCT 36.7 37.6 36.4 32.5* 40.5  MCV 97.1 100.5* 99.5 100.9* 104.1*  PLT 496* 513* 634* 531* 490*   Basic Metabolic Panel: Recent Labs  Lab 10/17/24 0404 10/17/24 0945 10/18/24 0425 10/18/24 1319 10/19/24 0405 10/20/24 1117 10/21/24 0333  NA 136  --   --  139 141 140 135  K 3.0*  --   --  3.6 2.9* 4.8 3.9  CL 103  --   --  104 104 106 103  CO2 24  --   --  16* 18* 25 23  GLUCOSE 94  --   --  93 76 117* 107*  BUN 10  --   --  16 16 17 12   CREATININE 0.53  --   --  0.53 0.58 0.52 0.40*  CALCIUM  9.1  --   --  9.6 8.9 9.4 9.0  MG  --  1.8 1.9  --   --   --   --    GFR: Estimated Creatinine Clearance: 52.6 mL/min (A) (by C-G formula based on SCr of 0.4 mg/dL (L)). Liver Function Tests: Recent Labs  Lab 10/16/24 1616 10/17/24 0404 10/18/24 1319 10/19/24 0405  AST 20 20 18  13*  ALT 13 13 14 9   ALKPHOS  182* 179* 195* 172*  BILITOT 0.9 0.8 0.7 0.5  PROT 7.2 6.9 6.9 6.0*  ALBUMIN 3.8 3.5 3.6 3.1*   No results for input(s): LIPASE, AMYLASE in the last 168 hours. Recent Labs  Lab 10/17/24 0404  AMMONIA 28   Coagulation Profile: No results for input(s): INR, PROTIME in the last 168 hours. Cardiac Enzymes: Recent Labs  Lab 10/17/24 1640  CKTOTAL 206   BNP (last 3 results) No results for input(s): PROBNP in the last 8760 hours. HbA1C: No results for input(s): HGBA1C in the last 72 hours. CBG: Recent Labs  Lab 10/16/24 1543 10/17/24 1117  GLUCAP 129* 119*   Lipid Profile: No results for input(s): CHOL, HDL, LDLCALC, TRIG, CHOLHDL, LDLDIRECT in the last 72 hours. Thyroid  Function Tests: No results for input(s): TSH, T4TOTAL, FREET4, T3FREE,  THYROIDAB in the last 72 hours.  Anemia Panel: No results for input(s): VITAMINB12, FOLATE, FERRITIN, TIBC, IRON, RETICCTPCT in the last 72 hours. Sepsis Labs: Recent Labs  Lab 10/16/24 1641 10/17/24 0404  PROCALCITON  --  0.10  LATICACIDVEN 1.1  --     Recent Results (from the past 240 hours)  Culture, blood (Routine X 2) w Reflex to ID Panel     Status: None (Preliminary result)   Collection Time: 10/16/24  4:14 PM   Specimen: BLOOD LEFT WRIST  Result Value Ref Range Status   Specimen Description   Final    BLOOD LEFT WRIST Performed at [facility], [address], [place], [place] [address]    Special Requests   Final    BOTTLES DRAWN AEROBIC AND ANAEROBIC Blood Culture adequate volume Performed at [facility], [address], [place], [place] [address]    Culture   Final    NO GROWTH 4 DAYS Performed at [facility], [address], [place], [place] [address]    Report Status PENDING  Incomplete  Culture, blood (Routine X 2) w Reflex to ID Panel     Status: None (Preliminary result)   Collection Time: 10/16/24  4:16 PM   Specimen: BLOOD  RIGHT ARM  Result Value Ref Range Status   Specimen Description   Final    BLOOD RIGHT ARM Performed at [facility], [address], [place], [place] [address]    Special Requests   Final    BOTTLES DRAWN AEROBIC AND ANAEROBIC Blood Culture results may not be optimal due to an inadequate volume of blood received in culture bottles Performed at [facility], [address], [place], [place] [address]    Culture   Final    NO GROWTH 4 DAYS Performed at [facility], [address], [place], [place] [address]    Report Status PENDING  Incomplete  MRSA Next Gen by PCR, Nasal     Status: None   Collection Time: 10/17/24  1:40 AM   Specimen: Urine, Clean Catch; Nasal Swab  Result Value Ref Range Status   MRSA by PCR Next Gen NOT DETECTED NOT DETECTED Final    Comment: (NOTE) The GeneXpert MRSA Assay (FDA approved for NASAL specimens only), is one component of a comprehensive MRSA colonization surveillance program. It is not intended to diagnose MRSA infection nor to guide or monitor treatment for MRSA infections. Test performance is not FDA approved in patients less than 13 years old. Performed at [facility], [address] [place], [place] [address]   Respiratory (~20 pathogens) panel by PCR     Status: None   Collection Time: 10/17/24  2:00 AM   Specimen: Nasopharyngeal Swab; Respiratory  Result Value Ref Range Status   Adenovirus NOT DETECTED NOT DETECTED Final   Coronavirus 229E NOT DETECTED NOT DETECTED Final    Comment: (NOTE) The Coronavirus on the Respiratory Panel, DOES NOT test for the novel  Coronavirus (2019 nCoV)    Coronavirus HKU1 NOT DETECTED NOT DETECTED Final   Coronavirus NL63 NOT DETECTED NOT DETECTED Final   Coronavirus OC43 NOT DETECTED NOT DETECTED Final   Metapneumovirus NOT DETECTED NOT DETECTED Final   Rhinovirus / Enterovirus NOT DETECTED NOT DETECTED Final   Influenza A NOT DETECTED NOT DETECTED  Final   Influenza B NOT DETECTED NOT DETECTED Final   Parainfluenza Virus 1 NOT DETECTED NOT DETECTED Final  Parainfluenza Virus 2 NOT DETECTED NOT DETECTED Final   Parainfluenza Virus 3 NOT DETECTED NOT DETECTED Final   Parainfluenza Virus 4 NOT DETECTED NOT DETECTED Final   Respiratory Syncytial Virus NOT DETECTED NOT DETECTED Final   Bordetella pertussis NOT DETECTED NOT DETECTED Final   Bordetella Parapertussis NOT DETECTED NOT DETECTED Final   Chlamydophila pneumoniae NOT DETECTED NOT DETECTED Final   Mycoplasma pneumoniae NOT DETECTED NOT DETECTED Final    Comment: Performed at [facility], [address], [place], [place] [address]         Radiology Studies: No results found.   Scheduled Meds:  acetaminophen   1,000 mg Oral TID   aspirin  EC  81 mg Oral Daily   buPROPion   150 mg Oral Daily   carvedilol   12.5 mg Oral BID WC   citalopram   40 mg Oral Daily   docusate sodium   100 mg Oral BID   enoxaparin  (LOVENOX ) injection  40 mg Subcutaneous Q24H   feeding supplement  237 mL Oral BID BM   irbesartan   150 mg Oral Daily   pantoprazole   40 mg Oral BID AC   pravastatin   40 mg Oral QPM   QUEtiapine  50 mg Oral QHS   sodium chloride  flush  3 mL Intravenous Q12H   Continuous Infusions:  cefTRIAXone (ROCEPHIN)  IV 2 g (10/20/24 2137)     LOS: 5 days    Almarie KANDICE Hoots, MD  10/21/2024, 9:18 AM

## 2024-10-22 DIAGNOSIS — J189 Pneumonia, unspecified organism: Secondary | ICD-10-CM | POA: Diagnosis not present

## 2024-10-22 LAB — BASIC METABOLIC PANEL WITH GFR
Anion gap: 12 (ref 5–15)
BUN: 14 mg/dL (ref 8–23)
CO2: 23 mmol/L (ref 22–32)
Calcium: 9.2 mg/dL (ref 8.9–10.3)
Chloride: 102 mmol/L (ref 98–111)
Creatinine, Ser: 0.47 mg/dL (ref 0.44–1.00)
GFR, Estimated: >60 mL/min (ref >60)
Glucose, Bld: 114 mg/dL — ABNORMAL HIGH (ref 70–99)
Potassium: 3.7 mmol/L (ref 3.5–5.1)
Sodium: 137 mmol/L (ref 135–145)

## 2024-10-22 MED ORDER — ACETAMINOPHEN 500 MG PO TABS: 1000.0000 mg | ORAL_TABLET | Freq: Three times a day (TID) | ORAL | 2 refills | Status: DC

## 2024-10-22 MED ORDER — TRAMADOL HCL 50 MG PO TABS: 50.0000 mg | ORAL_TABLET | Freq: Four times a day (QID) | ORAL | 0 refills | Status: DC | PRN

## 2024-10-22 MED ORDER — ALPRAZOLAM 0.25 MG PO TABS: 0.2500 mg | ORAL_TABLET | Freq: Two times a day (BID) | ORAL | 0 refills | Status: DC | PRN

## 2024-10-22 MED ORDER — CARVEDILOL 12.5 MG PO TABS: 12.5000 mg | ORAL_TABLET | Freq: Two times a day (BID) | ORAL | Status: AC

## 2024-10-22 NOTE — TOC Transition Note (Signed)
 Transition of Care [facility]) - Discharge Note  Patient Details  Name: Jody Wilson MRN: [id number] Date of Birth: [birth year]  Transition of [facility] CM/SW Contact:  Duwaine GORMAN Aran, LCSW Phone Number: 10/22/2024, 2:33 PM  Clinical Narrative: CSW confirmed with Harden in admissions for [facility] that patient can re-admit today. CSW completed insurance authorization on [employer] portal. Plan auth ID is: [id number]. Reference ID # is: [id number]. Patient is approved for 10/22/2024-10/24/2024. The number for report is [phone]. Discharge summary, discharge orders, and SNF transfer report faxed to facility in hub. Medical necessity form done; PTAR scheduled. Discharge packet completed. Son, [name], and patient notified regarding insurance approval and transportation being set up. RN updated. Care management signing off.  Final next level of care: Skilled Nursing Facility Barriers to Discharge: Barriers Resolved  Patient Goals and CMS Choice CMS Medicare.gov Compare Post Acute Care list provided to:: Patient Represenative (must comment) Choice offered to / list presented to : Patient, Adult Children  Discharge Placement Existing PASRR number confirmed : 10/19/24          Patient [facility] bed at: [facility] Patient to be transferred to facility by: PTAR Name of family member notified: [name] (son) Patient and family notified of of transfer: 10/22/24  Discharge Plan and Services Additional resources added to the After Visit Summary for   In-house Referral: Clinical Social Work Post Acute Care Choice: Skilled Nursing Facility          DME Arranged: N/A DME Agency: NA  Social Drivers of Health (SDOH) Interventions SDOH Screenings   Food Insecurity: No Food Insecurity (10/16/2024)  Housing: Low Risk  (10/16/2024)  Transportation Needs: No Transportation Needs (10/16/2024)  Utilities: Not At Risk (10/16/2024)  Alcohol Screen: Low Risk  (12/27/2023)   Depression (PHQ2-9): Low Risk  (07/24/2024)  Recent Concern: Depression (PHQ2-9) - High Risk (07/06/2024)  Financial Resource Strain: Low Risk  (12/27/2023)  Physical Activity: Inactive (12/27/2023)  Social Connections: Moderately Isolated (10/16/2024)  Stress: No Stress Concern Present (12/27/2023)  Tobacco Use: Medium Risk (10/16/2024)  Health Literacy: Adequate Health Literacy (12/27/2023)   Readmission Risk Interventions    10/22/2024    2:33 PM 04/13/2024    9:01 AM  Readmission Risk Prevention Plan  Medication Screening  Complete  Transportation Screening Complete Complete  HRI or Home Care Consult Complete   Social Work Consult for Recovery Care Planning/Counseling Complete   Palliative Care Screening Not Applicable   Medication Review Oceanographer) Complete

## 2024-10-22 NOTE — Progress Notes (Signed)
 Physical Therapy Treatment Patient Details Name: Jody Wilson MRN: [id number] DOB: 1951/10/04 Today's Date: 10/22/2024   History of Present Illness Patient is a 73 year old female who was participating in short term rehab for recent right pubic rami Fx and right proximal humerus Fx (surgery anticipated in Nov) in local SNF who was transported to the ED with AMS, including hallucinations reported by family.  Dx with sepsis in setting of PNA.  PMHx includes HTN, HLD, aortic atherosclerosis, carotid artery disease (cath), recent Hx of smoking, anemia, cholecystectomy, hysterectomy, osteoporosis, bilateral hip hemiarthroplasty (right, 2/24; left, 4/25), foot surgery x7, anxiety, depression    PT Comments   AxO x 3 pleasant and very willing with a little fear of falling.  Used + 2 assist for safety. Assisted OOB to [facility] and amb required + 2 assist. LPT has rec Pt will need ST Rehab at SNF to address mobility and functional decline prior to safely returning home.     If plan is discharge home, recommend the following: Assist for transportation;Help with stairs or ramp for entrance;Two people to help with walking and/or transfers;Two people to help with bathing/dressing/bathroom   Can travel by private vehicle     No  Equipment Recommendations  None recommended by PT    Recommendations for Other Services       Precautions / Restrictions Precautions Precautions: Fall Required Braces or Orthoses: Sling Restrictions Weight Bearing Restrictions Per Provider Order: Yes RUE Weight Bearing Per Provider Order: Non weight bearing RLE Weight Bearing Per Provider Order: Weight bearing as tolerated     Mobility  Bed Mobility Overal bed mobility: Needs Assistance Bed Mobility: Supine to Sit     Supine to sit: Max assist     General bed mobility comments: great difficulty transfering from supine to EOB due to increased c/o R buttock pain and inability to use R UE which is is in a sling  (NWB).  Used bed pad to complete scooting to EOB Max Assist.  Once upright, Pt was able to static sit at Supervision level.    Transfers Overall transfer level: Needs assistance Equipment used: 2 person hand held assist Transfers: Sit to/from Stand, Bed to chair/wheelchair/BSC Sit to Stand: Mod assist, +2 physical assistance, +2 safety/equipment Stand pivot transfers: Mod assist, +2 physical assistance, +2 safety/equipment         General transfer comment: assisted OOB to [facility] for void urgency required + 2 assist for safety with difficulty completing 1/4 turn, unsteady, fearful.  Then assisted off BSC required + 2 assist  for balance and peri care.    Ambulation/Gait Ambulation/Gait assistance: +2 physical assistance, +2 safety/equipment Gait Distance (Feet): 4 Feet Assistive device: 2 person hand held assist Gait Pattern/deviations: Step-through pattern, Decreased step length - right       General Gait Details: Pt was able to amb forward 4 feet with + 2 side by side assist HHA.  Very unsteady gait.  Increased fear of falling.  Increased c/o R buttock pain.  Recliner pulled to Pt from behind.   Stairs             Wheelchair Mobility     Tilt Bed    Modified Rankin (Stroke Patients Only)       Balance  Communication Communication Communication: No apparent difficulties  Cognition Arousal: Alert Behavior During Therapy: WFL for tasks assessed/performed   PT - Cognitive impairments: No apparent impairments                       PT - Cognition Comments: AxO x 3 pleasant and very willing with a little fear of falling.  Used + 2 assist for safety. Following commands: Intact      Cueing Cueing Techniques: Verbal cues  Exercises      General Comments        Pertinent Vitals/Pain Pain Assessment Pain Assessment: 0-10 Pain Score: 10-Worst pain ever Pain Location: c/o 10/10 R buttock  pain burning Pain Descriptors / Indicators: Burning, Grimacing Pain Intervention(s): Monitored during session, Repositioned, Patient requesting pain meds-RN notified    Home Living                          Prior Function            PT Goals (current goals can now be found in the care plan section) Progress towards PT goals: Progressing toward goals    Frequency    Min 2X/week      PT Plan      Co-evaluation              AM-PAC PT 6 Clicks Mobility   Outcome Measure  Help needed turning from your back to your side while in a flat bed without using bedrails?: A Lot Help needed moving from lying on your back to sitting on the side of a flat bed without using bedrails?: A Lot Help needed moving to and from a bed to a chair (including a wheelchair)?: A Lot Help needed standing up from a chair using your arms (e.g., wheelchair or bedside chair)?: A Lot Help needed to walk in hospital room?: A Lot Help needed climbing 3-5 steps with a railing? : Total 6 Click Score: 11    End of Session Equipment Utilized During Treatment: Gait belt Activity Tolerance: Patient limited by pain;Patient limited by fatigue Patient left: in chair;with call bell/phone within reach;with nursing/sitter in room Nurse Communication: Mobility status PT Visit Diagnosis: Other abnormalities of gait and mobility (R26.89);Repeated falls (R29.6)     Time: 8947-8879 PT Time Calculation (min) (ACUTE ONLY): 28 min  Charges:    $Gait Training: 8-22 mins $Therapeutic Activity: 8-22 mins PT General Charges $$ ACUTE PT VISIT: 1 Visit                     Katheryn Leap  PTA Acute  Rehabilitation Services Office M-F          [phone]

## 2024-10-22 NOTE — Discharge Summary (Signed)
 Physician Discharge Summary  Jody Wilson FMW:[id number] DOB: 1951/07/12 DOA: 10/16/2024  PCP: Dettinger, Fonda LABOR, MD  Admit date: 10/16/2024 Discharge date: 10/22/2024  Admitted From: home Disposition:  home  Recommendations for Outpatient Follow-up:  Follow up with PCP in 1-2 weeks Please obtain BMP/CBC in one week Follow-up with Ortho as scheduled on nov11  Home Health: None Equipment/Devices: None  Discharge Condition: Stable CODE STATUS: Full code Diet recommendation cardiac Brief/Interim Summary:   patient was admitted from [facility] nursing home on 10/16/2024 with complaints of confusion hallucination seeing her dead mother in the room.  She was recently admitted to the hospital on the 15th of this month after a fall and was found to have right humerus closed fracture right pubic ramus fracture Ortho recommended conservative management and she was discharged to skilled nursing facility on the 22nd.  Past medical history significant for anxiety depression GERD sinus bradycardia hypertension orthostatic hypotension iron deficiency anemia history of vertebral fracture.  At the time of admission she also complained of dysuria her UA came back negative for urinary tract infection. At the time of admission she was hypothermic with leukocytosis and normal lactate level. CT head showed no acute abnormalities. CT chest and abdomen and pelvis were done-. A cluster of nodular density in the right upper lobe, likely post inflammatory versus atypical pneumonia. Clinical correlation is recommended. Large hiatal hernia containing the majority of the stomach and a segment of the transverse colon. No bowel obstruction. Normal appendix.Displaced fractures of the right anterior and inferior pubic rami as well as insufficiency fractures of bilateral sacral ala. Normal TSH, normal ammonia Discharge Diagnoses:  Principal Problem:   Community acquired pneumonia Active Problems:   Altered  mental status, unspecified   Essential hypertension   Hyperlipidemia   GERD (gastroesophageal reflux disease)   Iron deficiency anemia   Anxiety and depression   Closed fracture of neck of left femur (HCC)   Closed fracture of right inferior pubic ramus (HCC)   History of bradycardia   Sepsis (HCC)   Dehydration    #1 acute metabolic encephalopathy resolved  in the setting of hypoxia, community-acquired pneumonia.  At baseline her mental status she is able to have a conversation and follow conversation she is not confused she is able to do her ADLs.  Her mental status is improved with stopping narcotics avoiding Ativan  excetra. She was found to be hypoxic in the ED and was placed on 2 L of oxygen.  Chest x-ray was concerning for consolidation.  She was treated with Rocephin and azithromycin .   #2 Sepsis secondary to CAP on admission she was hypothermic tachycardic with leukocytosis and hypoxia.  She was initially placed on vancomycin  and cefepime.  With ongoing change in mental status febrile antibiotics were stopped and she was started on Rocephin and azithromycin .(Cefepime side effects neurotoxicity) MRSA PCR negative.  #3 hypertension blood pressure improved after restarting Coreg  If she needs additional agents for control of blood pressure restart her on olmesartan .       #4 depression and anxiety continue Celexa  and Wellbutrin  Was on Xanax  prior to admission continue.   #5 history of recent fracture of the right pubic ramus, nondisplaced right sacral alar and closed fracture of the proximal right humerus she has follow-up appointment with Ortho on normal 11th  Continue Tylenol  1000 mg 3 times daily and Ultram  50 mg as needed.    #6 hypokalemia resolved    Estimated body mass index is 23.63 kg/m as calculated from  the following:   Height as of this encounter: 5' 3 (1.6 m).   Weight as of this encounter: 60.5 kg.  Discharge Instructions  Discharge Instructions     Diet -  low sodium heart healthy   Complete by: As directed    Diet - low sodium heart healthy   Complete by: As directed    Increase activity slowly   Complete by: As directed    Increase activity slowly   Complete by: As directed       Allergies as of 10/22/2024       Reactions   Tetracyclines & Related Itching, Swelling        Medication List     STOP taking these medications    ergocalciferol  1.25 MG (50000 UT) capsule Commonly known as: VITAMIN D2   oxyCODONE  5 MG immediate release tablet Commonly known as: Oxy IR/ROXICODONE    pantoprazole  40 MG tablet Commonly known as: PROTONIX        TAKE these medications    ALPRAZolam  0.25 MG tablet Commonly known as: XANAX  Take 1 tablet (0.25 mg total) by mouth 2 (two) times daily as needed for anxiety. What changed: Another medication with the same name was removed. Continue taking this medication, and follow the directions you see here.   aspirin  EC 81 MG tablet TAKE 1 PO BID THRU 05/13/24 THEN RESUME 1 PO DAILY What changed:  how much to take how to take this when to take this additional instructions   buPROPion  150 MG 24 hr tablet Commonly known as: Wellbutrin  XL Take 1 tablet (150 mg total) by mouth daily.   carvedilol  12.5 MG tablet Commonly known as: COREG  Take 1 tablet (12.5 mg total) by mouth 2 (two) times daily with a meal.   citalopram  40 MG tablet Commonly known as: CELEXA  Take 1 tablet (40 mg total) by mouth daily.   cyclobenzaprine  10 MG tablet Commonly known as: FLEXERIL  Take 10 mg by mouth in the morning and at bedtime.   docusate sodium  100 MG capsule Commonly known as: COLACE Take 1 capsule (100 mg total) by mouth 2 (two) times daily.   ondansetron  4 MG tablet Commonly known as: ZOFRAN  Take 1 tablet (4 mg total) by mouth every 8 (eight) hours as needed for nausea or vomiting.   polyethylene glycol 17 g packet Commonly known as: MIRALAX  / GLYCOLAX  Take 17 g by mouth daily as needed for mild  constipation.   pravastatin  40 MG tablet Commonly known as: PRAVACHOL  Take 1 tablet (40 mg total) by mouth every evening. What changed: when to take this   traMADol  50 MG tablet Commonly known as: ULTRAM  Take 1 tablet (50 mg total) by mouth every 6 (six) hours as needed for severe pain (pain score 7-10).        Contact information for follow-up providers     Dettinger, Fonda LABOR, MD Follow up.   Specialties: Family Medicine, Cardiology Contact information: [address] [place] [address] [phone]              Contact information for after-discharge care     Destination     [facility] .   Service: Skilled Nursing Contact information: [address] [place]  [address] [phone]                    Allergies  Allergen Reactions   Tetracyclines & Related Itching and Swelling    Consultations: pccm   Procedures/Studies:  EEG adult Result Date: 10/18/2024 Shelton Arlin KIDD, MD     10/18/2024  4:57 PM Patient Name: Jody Wilson MRN: [id number] Epilepsy Attending: Arlin KIDD Shelton Referring Physician/Provider: Will Almarie MATSU, MD Date: 10/18/2024 Duration: 22.43 mins Patient history: 72yo f with ams. EEG to evaluate for seizure Level of alertness: Awake, asleep AEDs during EEG study: None Technical aspects: This EEG study was done with scalp electrodes positioned according to the 10-20 International system of electrode placement. Electrical activity was reviewed with band pass filter of 1-70Hz , sensitivity of 7 uV/mm, display speed of 41mm/sec with a 60Hz  notched filter applied as appropriate. EEG data were recorded continuously and digitally stored.  Video monitoring was available and reviewed as appropriate. Description: The posterior dominant rhythm consists of 8 Hz activity of moderate voltage (25-35 uV) seen predominantly in posterior head regions, symmetric and reactive to eye opening  and eye closing. Sleep was characterized by vertex waves, sleep spindles (12 to 14 Hz), maximal frontocentral region. Physiologic photic driving was not seen during photic stimulation. Hyperventilation was not performed.   IMPRESSION: This study is within normal limits. No seizures or epileptiform discharges were seen throughout the recording. A normal interictal EEG does not exclude the diagnosis of epilepsy. Arlin KIDD Shelton   MR BRAIN W WO CONTRAST Result Date: 10/18/2024 EXAM: MRI BRAIN WITH AND WITHOUT CONTRAST 10/18/2024 12:24:58 PM TECHNIQUE: Multiplanar multisequence MRI of the head/brain was performed with and without the administration of 6 mL gadobutrol  (GADAVIST ) 1 MMOL/ML injection. COMPARISON: 09/07/2022 CLINICAL HISTORY: Mental status change, unknown cause. FINDINGS: BRAIN AND VENTRICLES: No acute infarct. No acute intracranial hemorrhage. No mass effect or midline shift. No hydrocephalus. The sella is unremarkable. Normal flow voids. No mass or abnormal enhancement. Multifocal hyperintense T2-weighted signal within the cerebral white matter, most commonly due to chronic small vessel disease. ORBITS: No acute abnormality. SINUSES: No acute abnormality. BONES AND SOFT TISSUES: Normal bone marrow signal and enhancement. No acute soft tissue abnormality. IMPRESSION: 1. No acute intracranial abnormality. 2. Multifocal T2 hyperintense white matter signal most consistent with chronic small vessel disease. Electronically signed by: Franky Stanford MD 10/18/2024 12:46 PM EDT RP Workstation: [id number]   DG Chest 1 View Result Date: 10/17/2024 EXAM: 1 VIEW XRAY OF THE CHEST 10/17/2024 11:31:00 AM COMPARISON: None available. CLINICAL HISTORY: SOB (shortness of breath) [id number]. FINDINGS: LUNGS AND PLEURA: Chronic coarsened interstitial markings without pulmonary edema. No pleural effusion. No pneumothorax. HEART AND MEDIASTINUM: Atherosclerotic plaque. Large Hiatal hernia. BONES AND SOFT TISSUES: Old  nonunionized right clavicular fracture. Cervical spine surgical hardware noted. IMPRESSION: 1. No acute cardiopulmonary findings. 2. Large hiatal hernia. Electronically signed by: Norleen Kil MD 10/17/2024 12:56 PM EDT RP Workstation: [id number]   CT CHEST ABDOMEN PELVIS W CONTRAST Result Date: 10/16/2024 CLINICAL DATA:  Sepsis. EXAM: CT CHEST, ABDOMEN, AND PELVIS WITH CONTRAST TECHNIQUE: Multidetector CT imaging of the chest, abdomen and pelvis was performed following the standard protocol during bolus administration of intravenous contrast. RADIATION DOSE REDUCTION: This exam was performed according to the departmental dose-optimization program which includes automated exposure control, adjustment of the mA and/or kV according to patient size and/or use of iterative reconstruction technique. CONTRAST:  OMNIPAQUE  IOHEXOL  300 MG/ML  SOLN COMPARISON:  Chest radiograph dated 10/16/2024 and pelvic CT dated 10/03/2024. FINDINGS: CT CHEST FINDINGS Cardiovascular: There is no cardiomegaly or pericardial effusion. There is coronary vascular calcification. Moderate calcified and noncalcified plaque of the thoracic aorta. No aneurysmal dilatation or dissection. The origins of the great vessels of the  aortic arch and the central pulmonary arteries are patent. Mediastinum/Nodes: No hilar or mediastinal adenopathy. There is a large hiatal hernia containing the majority of the stomach and a segment of the transverse colon. The esophagus is grossly unremarkable. No mediastinal fluid collection. Lungs/Pleura: Background of emphysema. There is partial compressive atelectasis of the lower lobes secondary to a large hiatal hernia. Faint cluster of nodular density in the right upper lobe likely post inflammatory or atypical infection. No consolidative changes. There is no pleural effusion pneumothorax. The central airways are patent. Musculoskeletal: No acute osseous pathology. Osteopenia with degenerative changes of the  spine. Old sternal fracture. T12 vertebroplasty. CT ABDOMEN PELVIS FINDINGS No intra-abdominal free air or free fluid. Hepatobiliary: The liver is unremarkable. There is mild dilatation, post cholecystectomy. No retained calcified stone noted in the central CBD. Pancreas: Unremarkable. No pancreatic ductal dilatation or surrounding inflammatory changes. Spleen: Normal in size without focal abnormality. Adrenals/Urinary Tract: The adrenal glands unremarkable. There is no hydronephrosis on either side. There is symmetric enhancement and excretion of contrast by both kidneys. The visualized ureters appear unremarkable the urinary bladder is poorly visualized due to streak artifact caused by bilateral total hip arthroplasties. Stomach/Bowel: There is no bowel obstruction or active inflammation. The appendix is normal. Vascular/Lymphatic: Advanced aortoiliac atherosclerotic disease. The IVC is unremarkable. No portal venous gas. There is no adenopathy. Reproductive: Evaluation of the pelvic structures is very limited due to streak artifact caused by bilateral hip arthroplasties. Other: None Musculoskeletal: Bilateral total hip arthroplasties. Displaced fractures of the right anterior and inferior pubic rami as well as insufficiency fractures of bilateral sacral ala. There is osteopenia with degenerative changes of the spine. IMPRESSION: 1. A cluster of nodular density in the right upper lobe, likely post inflammatory versus atypical pneumonia. Clinical correlation is recommended. 2. Large hiatal hernia containing the majority of the stomach and a segment of the transverse colon. No bowel obstruction. Normal appendix. 3. Displaced fractures of the right anterior and inferior pubic rami as well as insufficiency fractures of bilateral sacral ala. 4.  Aortic Atherosclerosis (ICD10-I70.0). Electronically Signed   By: Vanetta Chou M.D.   On: 10/16/2024 19:35   CT Head Wo Contrast Result Date: 10/16/2024 EXAM: CT HEAD  WITHOUT CONTRAST 10/16/2024 07:08:51 PM TECHNIQUE: CT of the head was performed without the administration of intravenous contrast. Automated exposure control, iterative reconstruction, and/or weight based adjustment of the mA/kV was utilized to reduce the radiation dose to as low as reasonably achievable. COMPARISON: 06/26/2024 CLINICAL HISTORY: Mental status change, unknown cause. Pt BIB [facility] from [facility] with reports of AMS since Saturday morning. Pt has been more confused than normal. Pt has had recent fractures, oriented to herself. Pt reports burning with urination. Right clavicle fx. FINDINGS: BRAIN AND VENTRICLES: No acute hemorrhage. No evidence of acute infarct. No hydrocephalus. No extra-axial collection. No mass effect or midline shift. Calcific atherosclerosis. ORBITS: No acute abnormality. SINUSES: Left sphenoid sinus mucosal thickening. SOFT TISSUES AND SKULL: No acute soft tissue abnormality. No skull fracture. LIMITATIONS/ARTIFACTS: Motion limited study. IMPRESSION: 1. Motion degraded exam. 2. No acute intracranial abnormality. 3. Chronic left sphenoid sinus disease. Electronically signed by: Morene Hoard MD 10/16/2024 07:31 PM EDT RP Workstation: [id number]   DG Chest Port 1 View Result Date: 10/16/2024 EXAM: 1 VIEW(S) XRAY OF THE CHEST 10/16/2024 03:29:00 PM COMPARISON: 10/03/2024 CLINICAL HISTORY: [id number] AMS (altered mental status) [id number]. Per chart: Pt BIB [facility] from [facility] with reports of AMS since Saturday morning. Pt  has been more confused than normal. Pt has had recent fractures, oriented to herself. Pt reports burning with urination. Right clavicle fx. 2L [place] at ; baseline. FINDINGS: LUNGS AND PLEURA: No focal pulmonary opacity. No pulmonary edema. No pleural effusion. No pneumothorax. HEART AND MEDIASTINUM: No acute abnormality of the cardiac and mediastinal silhouettes. Large hiatal hernia is again noted. BONES AND SOFT TISSUES: No acute osseous  abnormality. IMPRESSION: 1. No acute cardiopulmonary abnormality. 2. Large hiatal hernia. Electronically signed by: Lynwood Seip MD 10/16/2024 03:51 PM EDT RP Workstation: [id number]   CT PELVIS WO CONTRAST Result Date: 10/03/2024 CLINICAL DATA:  Pain after fall. EXAM: CT PELVIS WITHOUT CONTRAST TECHNIQUE: Multidetector CT imaging of the pelvis was performed following the standard protocol without intravenous contrast. RADIATION DOSE REDUCTION: This exam was performed according to the departmental dose-optimization program which includes automated exposure control, adjustment of the mA and/or kV according to patient size and/or use of iterative reconstruction technique. COMPARISON:  Same-day radiograph of the right hip dated 10/03/2024. FINDINGS: Bones/Joint/Cartilage Acute fracture of the right inferior pubic ramus with full shaft width of displacement in the transverse plane measuring 6 mm (series 2, image 105). Acute mildly displaced fracture of the right superior pubic ramus (series 7, images 52-53). Nondisplaced fracture of the right sacral ala with fracture margins extending to the right sacroiliac joint and cortical buckling anteriorly (series 2, images 26-34). No evidence of significant diastasis. Bilateral hip arthroplasties demonstrate normal alignment. Hardware is intact. Diffuse osseous demineralization. Degenerative changes of the visualized lower lumbar spine, most pronounced at L4-L5. Ligaments Ligaments are suboptimally evaluated by CT. Muscles and Tendons Soft tissue and intramuscular edema with hemorrhage surrounding the right superior and inferior pubic rami fractures. Soft tissue No loculated fluid collection. Aortoiliac atherosclerotic calcification. Descending and sigmoid colonic diverticulosis. IMPRESSION: 1. Acute fracture of the right inferior pubic ramus with full shaft width of displacement in the transverse plane measuring 6 mm. 2. Acute mildly displaced fracture of the right superior  pubic ramus. 3. Nondisplaced fracture of the right sacral ala with fracture margins extending to the right sacroiliac joint. No evidence of significant diastasis. 4. Bilateral hip arthroplasties demonstrate normal alignment. Hardware is intact. 5. Soft tissue and intramuscular edema with hemorrhage surrounding the right superior and inferior pubic rami fractures. Electronically Signed   By: Harrietta Sherry M.D.   On: 10/03/2024 19:14   CT SHOULDER RIGHT WO CONTRAST Result Date: 10/03/2024 CLINICAL DATA:  Right proximal humerus fracture after fall. EXAM: CT OF THE UPPER RIGHT EXTREMITY WITHOUT CONTRAST TECHNIQUE: Multidetector CT imaging of the upper right extremity was performed according to the standard protocol. RADIATION DOSE REDUCTION: This exam was performed according to the departmental dose-optimization program which includes automated exposure control, adjustment of the mA and/or kV according to patient size and/or use of iterative reconstruction technique. COMPARISON:  Radiograph dated 10/03/2024. FINDINGS: Bones/Joint/Cartilage Acute comminuted fracture of the greater tuberosity of the right proximal humerus with a minimally distracted cortical fracture fragment along the posterior margin of the humeral head. No dislocation. Mild glenohumeral joint osteoarthritis. 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 The acromioclavicular joint is anatomically aligned. Ligaments Suboptimally assessed by CT. Soft tissues/Muscles and Tendons Rotator cuff is suboptimally evaluated by CT. Soft tissue edema surrounding the proximal humeral fracture. Other: No enlarged lymph nodes identified in the field of view. Visualized portions of the right lung are clear. IMPRESSION: 1. Acute comminuted fracture of the greater tuberosity of the right proximal humerus with a minimally distracted cortical  fracture fragment along the posterior margin of the humeral head. No dislocation. 2. Ununited  fracture deformity of the right mid clavicle. Electronically Signed   By: Harrietta Sherry M.D.   On: 10/03/2024 19:02   DG Chest 2 View Result Date: 10/03/2024 CLINICAL DATA:  Status post fall. EXAM: CHEST - 2 VIEW COMPARISON:  None Available. FINDINGS: The heart size and mediastinal contours are within normal limits. No acute infiltrate, pleural effusion or pneumothorax is identified. There is a large hiatal hernia. Acute fracture deformity is seen involving the head and neck of the proximal right humerus. There is a chronic fracture deformity of the mid right clavicle. Postoperative changes are noted within the cervical spine. IMPRESSION: 1. Acute fracture of the proximal right humerus. 2. Large hiatal hernia. Electronically Signed   By: Suzen Dials M.D.   On: 10/03/2024 11:30   DG Humerus Right Result Date: 10/03/2024 CLINICAL DATA:  Status post fall. EXAM: RIGHT HUMERUS - 2+ VIEW COMPARISON:  None Available. FINDINGS: There is an acute fracture deformity involving the head and neck of the proximal right humerus. There is no evidence of dislocation. A chronic fracture of the mid right clavicle is noted. A chronic fracture of the right radial head is also suspected. Soft tissues are unremarkable. IMPRESSION: Acute fracture of the proximal right humerus. Electronically Signed   By: Suzen Dials M.D.   On: 10/03/2024 11:29   DG Hip Unilat W or Wo Pelvis 2-3 Views Right Result Date: 10/03/2024 CLINICAL DATA:  Status post fall. EXAM: DG HIP (WITH OR WITHOUT PELVIS) 2-3V*R* COMPARISON:  October 02, 2024 FINDINGS: Intact bilateral hip replacements are seen. Acute fracture is suspected along the right inferior pubic ramus. This represents a new finding when compared to the prior study a right superior pubic ramus fracture cannot be excluded. There is no evidence of dislocation. IMPRESSION: 1. Acute fracture of the right inferior pubic ramus. Correlation with nonemergent pelvis CT is recommended.  2. Intact bilateral hip replacements. Electronically Signed   By: Suzen Dials M.D.   On: 10/03/2024 11:27   DG Shoulder Right Result Date: 10/03/2024 CLINICAL DATA:  Status post fall. EXAM: RIGHT SHOULDER - 2+ VIEW COMPARISON:  August 13, 2024 FINDINGS: An acute, comminuted fracture deformity is seen involving the head and neck of the proximal right humerus. There is no evidence of dislocation. A chronic deformity of the mid right clavicle is noted. Postoperative changes are seen within the cervical spine. Soft tissues are unremarkable. IMPRESSION: Acute fracture of the proximal right humerus. Electronically Signed   By: Suzen Dials M.D.   On: 10/03/2024 11:24   CT CHEST LUNG CANCER SCREENING LOW DOSE WO CONTRAST Result Date: 10/03/2024 CLINICAL DATA:  47 pack-year smoking history/current smoker EXAM: CT CHEST WITHOUT CONTRAST LOW-DOSE FOR LUNG CANCER SCREENING TECHNIQUE: Multidetector CT imaging of the chest was performed following the standard protocol without IV contrast. RADIATION DOSE REDUCTION: This exam was performed according to the departmental dose-optimization program which includes automated exposure control, adjustment of the mA and/or kV according to patient size and/or use of iterative reconstruction technique. COMPARISON:  06/28/2024 CTA chest. Most recent lung cancer screening CT of 05/19/2021 FINDINGS: Cardiovascular: Aortic atherosclerosis. Tortuous descending thoracic aorta. Normal heart size, without pericardial effusion. Left main, lad, left circumflex coronary artery calcification. Mediastinum/Nodes: No mediastinal or hilar adenopathy, given limitations of unenhanced CT. Large hiatal hernia, with greater than 1/2 of the stomach positioned in the lower chest. Lungs/Pleura: No pleural fluid. Mild centrilobular emphysema. Right greater  than left apical pleuroparenchymal scarring. No change in pulmonary nodules of maximally 7.4 mm. Upper Abdomen: Normal imaged portions of the  liver, spleen, adrenal glands, kidneys. Musculoskeletal: Osteopenia. Lower cervical spine fixation. Remote, nonunited right clavicular fracture. Old right rib fractures. Vertebral augmentation at T11 secondary to a moderate compression deformity. IMPRESSION: 1. Lung-RADS 2, benign appearance or behavior. Continue annual screening with low-dose chest CT without contrast in 12 months. 2. Large hiatal hernia. 3. Aortic Atherosclerosis (ICD10-I70.0) and Emphysema (ICD10-J43.9). Coronary artery atherosclerosis. Electronically Signed   By: Rockey Kilts M.D.   On: 10/03/2024 10:46   DG HIP UNILAT W OR W/O PELVIS 2-3 VIEWS RIGHT Result Date: 10/02/2024 AP pelvis and right hip x-rays were obtained in clinic today.  These are compared to prior x-rays.  Well-positioned left hip hemiarthroplasty, without acute injury.  Additional views of the right hip were available, and demonstrates a well-positioned right hip hemiarthroplasty.  No subsidence.  No fractures.  No acute injuries.  The hip is reduced.  No signs of excessive wear within the right acetabulum.  No subchondral cyst formation.  No subchondral sclerosis.  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   (Echo, Carotid, EGD, Colonoscopy, ERCP)    Subjective:  Resting in bed has a lot of pain in her pelvis from the recent fracture breathing okay no new complaints Discharge Exam: Vitals:   10/22/24 0839 10/22/24 1316  BP: (!) 155/102 117/66  Pulse: 94 75  Resp:  20  Temp:  99 F (37.2 C)  SpO2:  95%   Vitals:   10/21/24 2125 10/22/24 0540 10/22/24 0839 10/22/24 1316  BP:  (!) 149/99 (!) 155/102 117/66  Pulse: 98 86 94 75  Resp:  18  20  Temp:  98.3 F (36.8 C)  99 F (37.2 C)  TempSrc:  Oral  Oral  SpO2:  93%  95%  Weight:      Height:        General: Pt is alert, awake, not in acute distress Cardiovascular: RRR, S1/S2 +, no rubs, no gallops Respiratory: CTA bilaterally, no wheezing, no  rhonchi Abdominal: Soft, NT, ND, bowel sounds + Extremities: no edema, no cyanosis    The results of significant diagnostics from this hospitalization (including imaging, microbiology, ancillary and laboratory) are listed below for reference.     Microbiology: Recent Results (from the past 240 hours)  Culture, blood (Routine X 2) w Reflex to ID Panel     Status: None   Collection Time: 10/16/24  4:14 PM   Specimen: BLOOD LEFT WRIST  Result Value Ref Range Status   Specimen Description   Final    BLOOD LEFT WRIST Performed at [facility], [address], [place], [place] [address]    Special Requests   Final    BOTTLES DRAWN AEROBIC AND ANAEROBIC Blood Culture adequate volume Performed at [facility], [address], [place], [place] [address]    Culture   Final    NO GROWTH 5 DAYS Performed at [facility], [address], [place], [place] [address]    Report Status 10/21/2024 FINAL  Final  Culture, blood (Routine X 2) w Reflex to ID Panel     Status: None   Collection Time: 10/16/24  4:16 PM   Specimen: BLOOD RIGHT ARM  Result Value Ref Range Status   Specimen Description   Final    BLOOD RIGHT ARM Performed at [facility], [address] [place], [place] [address]    Special Requests   Final    BOTTLES DRAWN AEROBIC AND ANAEROBIC Blood Culture results may not be optimal due to an inadequate volume of blood received in culture bottles Performed at [facility], [address] [place], [place] [address]    Culture   Final    NO GROWTH 5 DAYS Performed at [facility], [address] [place], [place] [address]    Report Status 10/21/2024 FINAL  Final  MRSA Next Gen by PCR, Nasal     Status: None   Collection Time: 10/17/24  1:40 AM   Specimen: Urine, Clean Catch; Nasal Swab  Result Value Ref Range Status   MRSA by PCR Next Gen NOT DETECTED NOT DETECTED Final    Comment: (NOTE) The GeneXpert MRSA  Assay (FDA approved for NASAL specimens only), is one component of a comprehensive MRSA colonization surveillance program. It is not intended to diagnose MRSA infection nor to guide or monitor treatment for MRSA infections. Test performance is not FDA approved in patients less than 61 years old. Performed at [facility], [address] [place], [place] [address]   Respiratory (~20 pathogens) panel by PCR     Status: None   Collection Time: 10/17/24  2:00 AM   Specimen: Nasopharyngeal Swab; Respiratory  Result Value Ref Range Status   Adenovirus NOT DETECTED NOT DETECTED Final   Coronavirus 229E NOT DETECTED NOT DETECTED Final    Comment: (NOTE) The Coronavirus on the Respiratory Panel, DOES NOT test for the novel  Coronavirus (2019 nCoV)    Coronavirus HKU1 NOT DETECTED NOT DETECTED Final   Coronavirus NL63 NOT DETECTED NOT DETECTED Final   Coronavirus OC43 NOT DETECTED NOT DETECTED Final   Metapneumovirus NOT DETECTED NOT DETECTED Final   Rhinovirus / Enterovirus NOT DETECTED NOT DETECTED Final   Influenza A NOT DETECTED NOT DETECTED Final   Influenza B NOT DETECTED NOT DETECTED Final   Parainfluenza Virus 1 NOT DETECTED NOT DETECTED Final   Parainfluenza Virus 2 NOT DETECTED NOT DETECTED Final   Parainfluenza Virus 3 NOT DETECTED NOT DETECTED Final   Parainfluenza Virus 4 NOT DETECTED NOT DETECTED Final   Respiratory Syncytial Virus NOT DETECTED NOT DETECTED Final   Bordetella pertussis NOT DETECTED NOT DETECTED Final   Bordetella Parapertussis NOT DETECTED NOT DETECTED Final   Chlamydophila pneumoniae NOT DETECTED NOT DETECTED Final   Mycoplasma pneumoniae NOT DETECTED NOT DETECTED Final    Comment: Performed at [facility], [address], [place], [place] [address]     Labs: BNP (last 3 results) Recent Labs    06/26/24 2009  BNP 56.0   Basic Metabolic Panel: Recent Labs  Lab 10/17/24 0945 10/18/24 0425 10/18/24 1319 10/19/24 0405  10/20/24 1117 10/21/24 0333 10/22/24 0338  NA  --   --  139 141 140 135 137  K  --   --  3.6 2.9* 4.8 3.9 3.7  CL  --   --  104 104 106 103 102  CO2  --   --  16* 18* 25 23 23   GLUCOSE  --   --  93 76 117* 107* 114*  BUN  --   --  16 16 17 12 14   CREATININE  --   --  0.53 0.58 0.52 0.40* 0.47  CALCIUM   --   --  9.6 8.9 9.4 9.0 9.2  MG 1.8 1.9  --   --   --   --   --  Liver Function Tests: Recent Labs  Lab 10/16/24 1616 10/17/24 0404 10/18/24 1319 10/19/24 0405  AST 20 20 18  13*  ALT 13 13 14 9   ALKPHOS 182* 179* 195* 172*  BILITOT 0.9 0.8 0.7 0.5  PROT 7.2 6.9 6.9 6.0*  ALBUMIN 3.8 3.5 3.6 3.1*   No results for input(s): LIPASE, AMYLASE in the last 168 hours. Recent Labs  Lab 10/17/24 0404  AMMONIA 28   CBC: Recent Labs  Lab 10/16/24 1616 10/17/24 0404 10/18/24 1319 10/19/24 0405 10/21/24 0333  WBC 14.0* 11.9* 10.7* 7.8 8.3  NEUTROABS 11.4*  --   --   --   --   HGB 12.2 12.0 11.8* 10.8* 12.6  HCT 36.7 37.6 36.4 32.5* 40.5  MCV 97.1 100.5* 99.5 100.9* 104.1*  PLT 496* 513* 634* 531* 490*   Cardiac Enzymes: Recent Labs  Lab 10/17/24 1640  CKTOTAL 206   BNP: Invalid input(s): POCBNP CBG: Recent Labs  Lab 10/16/24 1543 10/17/24 1117  GLUCAP 129* 119*   D-Dimer No results for input(s): DDIMER in the last 72 hours. Hgb A1c No results for input(s): HGBA1C in the last 72 hours. Lipid Profile No results for input(s): CHOL, HDL, LDLCALC, TRIG, CHOLHDL, LDLDIRECT in the last 72 hours. Thyroid  function studies No results for input(s): TSH, T4TOTAL, T3FREE, THYROIDAB in the last 72 hours.  Invalid input(s): FREET3 Anemia work up No results for input(s): VITAMINB12, FOLATE, FERRITIN, TIBC, IRON, RETICCTPCT in the last 72 hours. Urinalysis    Component Value Date/Time   COLORURINE AMBER (A) 10/16/2024 1807   APPEARANCEUR CLOUDY (A) 10/16/2024 1807   APPEARANCEUR Clear 01/22/2022 1411   LABSPEC 1.014  10/16/2024 1807   PHURINE 8.0 10/16/2024 1807   GLUCOSEU NEGATIVE 10/16/2024 1807   HGBUR NEGATIVE 10/16/2024 1807   BILIRUBINUR NEGATIVE 10/16/2024 1807   BILIRUBINUR Negative 01/22/2022 1411   KETONESUR 5 (A) 10/16/2024 1807   PROTEINUR NEGATIVE 10/16/2024 1807   UROBILINOGEN negative 04/11/2014 1713   NITRITE NEGATIVE 10/16/2024 1807   LEUKOCYTESUR NEGATIVE 10/16/2024 1807   Sepsis Labs Recent Labs  Lab 10/17/24 0404 10/18/24 1319 10/19/24 0405 10/21/24 0333  WBC 11.9* 10.7* 7.8 8.3   Microbiology Recent Results (from the past 240 hours)  Culture, blood (Routine X 2) w Reflex to ID Panel     Status: None   Collection Time: 10/16/24  4:14 PM   Specimen: BLOOD LEFT WRIST  Result Value Ref Range Status   Specimen Description   Final    BLOOD LEFT WRIST Performed at [facility], [address], [place], [place] [address]    Special Requests   Final    BOTTLES DRAWN AEROBIC AND ANAEROBIC Blood Culture adequate volume Performed at [facility], [address], [place], [place] [address]    Culture   Final    NO GROWTH 5 DAYS Performed at [facility], [address], [place], [place] [address]    Report Status 10/21/2024 FINAL  Final  Culture, blood (Routine X 2) w Reflex to ID Panel     Status: None   Collection Time: 10/16/24  4:16 PM   Specimen: BLOOD RIGHT ARM  Result Value Ref Range Status   Specimen Description   Final    BLOOD RIGHT ARM Performed at [facility], [address], [place], [place] [address]    Special Requests   Final    BOTTLES DRAWN AEROBIC AND ANAEROBIC Blood Culture results may not be optimal due to an  inadequate volume of blood received in culture bottles Performed at [facility], [address] [place], [place] [address]    Culture   Final    NO GROWTH 5 DAYS Performed at [facility], [address] [place], [place] [address]    Report Status 10/21/2024 FINAL  Final   MRSA Next Gen by PCR, Nasal     Status: None   Collection Time: 10/17/24  1:40 AM   Specimen: Urine, Clean Catch; Nasal Swab  Result Value Ref Range Status   MRSA by PCR Next Gen NOT DETECTED NOT DETECTED Final    Comment: (NOTE) The GeneXpert MRSA Assay (FDA approved for NASAL specimens only), is one component of a comprehensive MRSA colonization surveillance program. It is not intended to diagnose MRSA infection nor to guide or monitor treatment for MRSA infections. Test performance is not FDA approved in patients less than 42 years old. Performed at [facility], [address] [place], [place] [address]   Respiratory (~20 pathogens) panel by PCR     Status: None   Collection Time: 10/17/24  2:00 AM   Specimen: Nasopharyngeal Swab; Respiratory  Result Value Ref Range Status   Adenovirus NOT DETECTED NOT DETECTED Final   Coronavirus 229E NOT DETECTED NOT DETECTED Final    Comment: (NOTE) The Coronavirus on the Respiratory Panel, DOES NOT test for the novel  Coronavirus (2019 nCoV)    Coronavirus HKU1 NOT DETECTED NOT DETECTED Final   Coronavirus NL63 NOT DETECTED NOT DETECTED Final   Coronavirus OC43 NOT DETECTED NOT DETECTED Final   Metapneumovirus NOT DETECTED NOT DETECTED Final   Rhinovirus / Enterovirus NOT DETECTED NOT DETECTED Final   Influenza A NOT DETECTED NOT DETECTED Final   Influenza B NOT DETECTED NOT DETECTED Final   Parainfluenza Virus 1 NOT DETECTED NOT DETECTED Final   Parainfluenza Virus 2 NOT DETECTED NOT DETECTED Final   Parainfluenza Virus 3 NOT DETECTED NOT DETECTED Final   Parainfluenza Virus 4 NOT DETECTED NOT DETECTED Final   Respiratory Syncytial Virus NOT DETECTED NOT DETECTED Final   Bordetella pertussis NOT DETECTED NOT DETECTED Final   Bordetella Parapertussis NOT DETECTED NOT DETECTED Final   Chlamydophila pneumoniae NOT DETECTED NOT DETECTED Final   Mycoplasma pneumoniae NOT DETECTED NOT DETECTED Final    Comment:  Performed at [facility], [address] [place], [place] [address]     Time coordinating discharge: 39 min  SIGNED:   Almarie KANDICE Hoots, MD  [facility] 10/22/2024, 1:20 PM

## 2024-10-24 LAB — BLOOD GAS, ARTERIAL
Acid-base deficit: 6.6 mmol/L — ABNORMAL HIGH (ref 0.0–2.0)
Bicarbonate: 18.3 mmol/L — ABNORMAL LOW (ref 20.0–28.0)
O2 Content: 4 L/min
O2 Saturation: 96.6 %
Patient temperature: 36.7
pCO2 arterial: 34 mmHg (ref 32–48)
pH, Arterial: 7.34 — ABNORMAL LOW (ref 7.35–7.45)
pO2, Arterial: 73 mmHg — ABNORMAL LOW (ref 83–108)

## 2024-10-26 ENCOUNTER — Inpatient Hospital Stay (HOSPITAL_COMMUNITY)

## 2024-10-29 ENCOUNTER — Encounter (HOSPITAL_COMMUNITY): Admission: RE | Payer: Self-pay | Source: Home / Self Care

## 2024-10-29 ENCOUNTER — Emergency Department (HOSPITAL_COMMUNITY)

## 2024-10-29 ENCOUNTER — Ambulatory Visit (HOSPITAL_COMMUNITY): Admission: RE | Admit: 2024-10-29 | Source: Home / Self Care | Admitting: Orthopedic Surgery

## 2024-10-29 ENCOUNTER — Observation Stay (HOSPITAL_COMMUNITY)
Admission: EM | Admit: 2024-10-29 | Discharge: 2024-11-05 | Disposition: A | Discharge status: Skilled Nursing Facility | Source: Skilled Nursing Facility | Attending: Internal Medicine | Admitting: Internal Medicine

## 2024-10-29 ENCOUNTER — Encounter (HOSPITAL_COMMUNITY): Payer: Self-pay

## 2024-10-29 ENCOUNTER — Other Ambulatory Visit: Payer: Self-pay

## 2024-10-29 DIAGNOSIS — R319 Hematuria, unspecified: Secondary | ICD-10-CM | POA: Insufficient documentation

## 2024-10-29 DIAGNOSIS — F419 Anxiety disorder, unspecified: Secondary | ICD-10-CM | POA: Diagnosis present

## 2024-10-29 DIAGNOSIS — F32A Depression, unspecified: Secondary | ICD-10-CM | POA: Insufficient documentation

## 2024-10-29 DIAGNOSIS — X58XXXA Exposure to other specified factors, initial encounter: Secondary | ICD-10-CM | POA: Insufficient documentation

## 2024-10-29 DIAGNOSIS — S32059A Unspecified fracture of fifth lumbar vertebra, initial encounter for closed fracture: Secondary | ICD-10-CM | POA: Insufficient documentation

## 2024-10-29 DIAGNOSIS — S32049A Unspecified fracture of fourth lumbar vertebra, initial encounter for closed fracture: Secondary | ICD-10-CM | POA: Insufficient documentation

## 2024-10-29 DIAGNOSIS — I10 Essential (primary) hypertension: Secondary | ICD-10-CM | POA: Insufficient documentation

## 2024-10-29 DIAGNOSIS — S32591A Other specified fracture of right pubis, initial encounter for closed fracture: Secondary | ICD-10-CM | POA: Diagnosis present

## 2024-10-29 DIAGNOSIS — Z7982 Long term (current) use of aspirin: Secondary | ICD-10-CM | POA: Insufficient documentation

## 2024-10-29 DIAGNOSIS — Z79899 Other long term (current) drug therapy: Secondary | ICD-10-CM | POA: Insufficient documentation

## 2024-10-29 DIAGNOSIS — R410 Disorientation, unspecified: Secondary | ICD-10-CM | POA: Diagnosis not present

## 2024-10-29 DIAGNOSIS — T887XXA Unspecified adverse effect of drug or medicament, initial encounter: Secondary | ICD-10-CM | POA: Insufficient documentation

## 2024-10-29 DIAGNOSIS — R441 Visual hallucinations: Principal | ICD-10-CM | POA: Insufficient documentation

## 2024-10-29 DIAGNOSIS — R4182 Altered mental status, unspecified: Secondary | ICD-10-CM | POA: Diagnosis present

## 2024-10-29 DIAGNOSIS — G929 Unspecified toxic encephalopathy: Secondary | ICD-10-CM | POA: Diagnosis present

## 2024-10-29 LAB — URINALYSIS, ROUTINE W REFLEX MICROSCOPIC
Bacteria, UA: NONE SEEN
Bilirubin Urine: NEGATIVE
Glucose, UA: NEGATIVE mg/dL
Hgb urine dipstick: NEGATIVE
Ketones, ur: NEGATIVE mg/dL
Nitrite: NEGATIVE
Protein, ur: NEGATIVE mg/dL
Specific Gravity, Urine: 1.013 (ref 1.005–1.030)
pH: 6 (ref 5.0–8.0)

## 2024-10-29 LAB — CBC
HCT: 42.6 % (ref 36.0–46.0)
Hemoglobin: 13.7 g/dL (ref 12.0–15.0)
MCH: 32.5 pg (ref 26.0–34.0)
MCHC: 32.2 g/dL (ref 30.0–36.0)
MCV: 101.2 fL — ABNORMAL HIGH (ref 80.0–100.0)
Platelets: 460 K/uL — ABNORMAL HIGH (ref 150–400)
RBC: 4.21 MIL/uL (ref 3.87–5.11)
RDW: 14.3 % (ref 11.5–15.5)
WBC: 11.4 K/uL — ABNORMAL HIGH (ref 4.0–10.5)
nRBC: 0 % (ref 0.0–0.2)

## 2024-10-29 LAB — BASIC METABOLIC PANEL WITH GFR
Anion gap: 11 (ref 5–15)
BUN: 14 mg/dL (ref 8–23)
CO2: 24 mmol/L (ref 22–32)
Calcium: 9.8 mg/dL (ref 8.9–10.3)
Chloride: 103 mmol/L (ref 98–111)
Creatinine, Ser: 0.65 mg/dL (ref 0.44–1.00)
GFR, Estimated: >60 mL/min (ref >60)
Glucose, Bld: 114 mg/dL — ABNORMAL HIGH (ref 70–99)
Potassium: 4.3 mmol/L (ref 3.5–5.1)
Sodium: 138 mmol/L (ref 135–145)

## 2024-10-29 LAB — RESP PANEL BY RT-PCR (RSV, FLU A&B, COVID)  RVPGX2
Influenza A by PCR: NEGATIVE
Influenza B by PCR: NEGATIVE
Resp Syncytial Virus by PCR: NEGATIVE
SARS Coronavirus 2 by RT PCR: NEGATIVE

## 2024-10-29 LAB — AMMONIA: Ammonia: 25 umol/L (ref 9–35)

## 2024-10-29 SURGERY — OPEN REDUCTION INTERNAL FIXATION (ORIF) CLAVICULAR FRACTURE
Anesthesia: Choice | Laterality: Right

## 2024-10-29 MED ORDER — ACETAMINOPHEN 325 MG PO TABS: 650.0000 mg | ORAL_TABLET | Freq: Four times a day (QID) | ORAL | Status: DC | PRN

## 2024-10-29 MED ORDER — ENOXAPARIN SODIUM 40 MG/0.4ML IJ SOSY
40.0000 mg | PREFILLED_SYRINGE | INTRAMUSCULAR | Status: DC
  Administered 2024-10-29 – 2024-11-02 (×5): 40 mg via SUBCUTANEOUS
  Filled 2024-10-29 (×5): qty 0.4

## 2024-10-29 MED ORDER — CYCLOBENZAPRINE HCL 10 MG PO TABS
10.0000 mg | ORAL_TABLET | Freq: Every day | ORAL | Status: DC
  Administered 2024-10-29: 10 mg via ORAL
  Filled 2024-10-29: qty 1

## 2024-10-29 MED ORDER — ACETAMINOPHEN 500 MG PO TABS
1000.0000 mg | ORAL_TABLET | Freq: Three times a day (TID) | ORAL | Status: DC
  Administered 2024-10-29 – 2024-11-05 (×21): 1000 mg via ORAL
  Filled 2024-10-29 (×21): qty 2

## 2024-10-29 MED ORDER — ACETAMINOPHEN 650 MG RE SUPP: 650.0000 mg | Freq: Four times a day (QID) | RECTAL | Status: DC | PRN

## 2024-10-29 MED ORDER — GABAPENTIN 100 MG PO CAPS
100.0000 mg | ORAL_CAPSULE | Freq: Two times a day (BID) | ORAL | Status: DC
  Administered 2024-10-29 (×2): 100 mg via ORAL
  Filled 2024-10-29 (×2): qty 1

## 2024-10-29 MED ORDER — ONDANSETRON HCL 4 MG/2ML IJ SOLN: 4.0000 mg | Freq: Four times a day (QID) | INTRAMUSCULAR | Status: DC | PRN

## 2024-10-29 MED ORDER — CARVEDILOL 12.5 MG PO TABS
12.5000 mg | ORAL_TABLET | Freq: Two times a day (BID) | ORAL | Status: DC
  Administered 2024-10-29 – 2024-11-05 (×14): 12.5 mg via ORAL
  Filled 2024-10-29 (×14): qty 1

## 2024-10-29 MED ORDER — ASPIRIN 81 MG PO TBEC
81.0000 mg | DELAYED_RELEASE_TABLET | Freq: Every day | ORAL | Status: DC
  Administered 2024-10-29 – 2024-11-05 (×8): 81 mg via ORAL
  Filled 2024-10-29 (×8): qty 1

## 2024-10-29 MED ORDER — KETOROLAC TROMETHAMINE 15 MG/ML IJ SOLN
15.0000 mg | Freq: Four times a day (QID) | INTRAMUSCULAR | Status: DC | PRN
  Administered 2024-10-29 – 2024-11-02 (×8): 15 mg via INTRAVENOUS
  Filled 2024-10-29 (×9): qty 1

## 2024-10-29 MED ORDER — CITALOPRAM HYDROBROMIDE 20 MG PO TABS
40.0000 mg | ORAL_TABLET | Freq: Every day | ORAL | Status: DC
  Administered 2024-10-29 – 2024-11-05 (×8): 40 mg via ORAL
  Filled 2024-10-29 (×8): qty 2

## 2024-10-29 MED ORDER — OLANZAPINE 10 MG IM SOLR: 2.5000 mg | Freq: Four times a day (QID) | INTRAMUSCULAR | Status: DC | PRN

## 2024-10-29 MED ORDER — ALPRAZOLAM 0.25 MG PO TABS
0.2500 mg | ORAL_TABLET | Freq: Two times a day (BID) | ORAL | Status: DC | PRN
  Administered 2024-10-29 – 2024-11-05 (×13): 0.25 mg via ORAL
  Filled 2024-10-29 (×13): qty 1

## 2024-10-29 MED ORDER — ONDANSETRON HCL 4 MG PO TABS: 4.0000 mg | ORAL_TABLET | Freq: Four times a day (QID) | ORAL | Status: DC | PRN

## 2024-10-29 MED ORDER — BUPROPION HCL ER (XL) 150 MG PO TB24
150.0000 mg | ORAL_TABLET | Freq: Every day | ORAL | Status: DC
  Administered 2024-10-29 – 2024-11-05 (×8): 150 mg via ORAL
  Filled 2024-10-29 (×8): qty 1

## 2024-10-29 MED ORDER — DOCUSATE SODIUM 100 MG PO CAPS
100.0000 mg | ORAL_CAPSULE | Freq: Two times a day (BID) | ORAL | Status: DC
  Administered 2024-10-29 – 2024-11-05 (×14): 100 mg via ORAL
  Filled 2024-10-29 (×14): qty 1

## 2024-10-29 MED ORDER — POLYETHYLENE GLYCOL 3350 17 G PO PACK
17.0000 g | PACK | Freq: Every day | ORAL | Status: DC | PRN
  Administered 2024-11-02 – 2024-11-05 (×2): 17 g via ORAL
  Filled 2024-10-29 (×3): qty 1

## 2024-10-29 MED ORDER — PRAVASTATIN SODIUM 40 MG PO TABS
40.0000 mg | ORAL_TABLET | Freq: Every day | ORAL | Status: DC
  Administered 2024-10-29 – 2024-11-05 (×8): 40 mg via ORAL
  Filled 2024-10-29 (×8): qty 1

## 2024-10-29 NOTE — ED Triage Notes (Signed)
 Patient BIB EMS for altered mental status, intermittent from [facility] Recent fx right arm and hip, no pain meds currently.  Patient was given 100 mcg Fentanyl  and 4mg  Zofran  prior to arrival.

## 2024-10-29 NOTE — H&P (Signed)
 History and Physical    Jody Wilson FMW:[id number] DOB: [birth year] DOA: 10/29/2024  PCP: Dettinger, Fonda LABOR, MD (Confirm with patient/family/NH records and if not entered, this has to be entered at [facility] point of entry) Patient coming from: SNF  I have personally briefly reviewed patient's old medical records in [facility]  Chief Complaint: See strange things again  HPI: Jody Wilson is a 73 y.o. female with medical history significant of recent closed fracture of right humerus and pubic ramus on October 15 on conservative management/pain management, osteoporosis, anxiety/depression, chronic sciatica pain, HTN, sent from nursing home for evaluation of altered mentations.  Patient was hospitalized last week for similar clinical course of AMS when patient had visual hallucination.  Family at bedside reported that during last admission, polypharmacy was considered to be the main driving force for AMS and especially the narcotics/oxycodone  which was prescribed after the shoulder surgery.  After put on hold oxycodone , patient's mentation significant proved and discharged to nursing home.  Patient was instructed to go back on oxycodone  however started 2 to 3 days ago, patient reinitiated on oxycodone  to address uncontrolled pain, and both of her right shoulder as well as chronic lower back pain.  Started yesterday patient started to see  strange things such as strangers in the room and things floating around, patient denied any cough fever chills dysuria or diarrhea.  ED Course: Afebrile, no tachycardia no hypotension not hypoxic.  CT head negative for acute findings, CT lumbar spine showed chronic fractures of right L4-L5 transverse processes.  Blood work showed WBC 11.4 hemoglobin 13.7 BUN 14 creatinine 0.6 bicarb 24.  Review of Systems: As per HPI otherwise 14 point review of systems negative.    Past Medical History:  Diagnosis Date   Anemia    Anxiety    Aortic  atherosclerosis    B12 deficiency 09/25/2019   Carotid artery disease    Depression    GERD (gastroesophageal reflux disease)    Hyperlipidemia    Hypertension    Orthostatic hypotension    Osteoporosis    Pulmonary nodule 02/09/2023   Incidental finding on chest x-ray 02/09/2023- Nodule in the RIGHT upper lobe measuring approximately 7 mm may be new from previous chest radiographs though could be a portion of scarring that is seen in the upper chest. Consider short interval PA and lateral chest radiograph when the patient is able. If persistent CT may be warranted.  - F/u as outpatient      Past Surgical History:  Procedure Laterality Date   ABDOMINAL HYSTERECTOMY     BACK SURGERY     CHOLECYSTECTOMY     FOOT SURGERY     7 in the past   HIP ARTHROPLASTY Right 02/11/2023   Procedure: ARTHROPLASTY BIPOLAR HIP (HEMIARTHROPLASTY);  Surgeon: Onesimo Oneil LABOR, MD;  Location: [facility];  Service: Orthopedics;  Laterality: Right;   HIP ARTHROPLASTY Left 04/13/2024   Procedure: HEMIARTHROPLASTY (BIPOLAR) HIP, DIRECT LATERAL  FOR FRACTURE;  Surgeon: Margrette Taft BRAVO, MD;  Location: [facility];  Service: Orthopedics;  Laterality: Left;   IR GENERIC HISTORICAL  12/09/2016   IR RADIOLOGIST EVAL & MGMT 12/09/2016 [facility]   IR GENERIC HISTORICAL  12/10/2016   IR VERTEBROPLASTY CERV/THOR BX INC UNI/BIL INC/INJECT/IMAGING 12/10/2016 [facility]   LEFT HEART CATH AND CORONARY ANGIOGRAPHY N/A 09/30/2020   Procedure: LEFT HEART CATH AND CORONARY ANGIOGRAPHY;  Surgeon: Elmira Newman PARAS, MD;  Location: [facility];  Service: Cardiovascular;  Laterality: N/A;  LEFT HEART CATH AND CORONARY ANGIOGRAPHY N/A 06/27/2024   Procedure: LEFT HEART CATH AND CORONARY ANGIOGRAPHY;  Surgeon: Verlin Lonni BIRCH, MD;  Location: [facility];  Service: Cardiovascular;  Laterality: N/A;   TRACHEOSTOMY TUBE PLACEMENT  1982     reports that she quit smoking about 20 months ago. Her smoking use included  cigarettes. She started smoking about 26 years ago. She has a 6.3 pack-year smoking history. She has never been exposed to tobacco smoke. She has never used smokeless tobacco. She reports that she does not currently use alcohol. She reports that she does not use drugs.  Allergies  Allergen Reactions   Tetracyclines & Related Itching and Swelling    Family History  Problem Relation Age of Onset   Heart disease Mother    Hypertension Mother    Hyperlipidemia Mother    Diabetes Mother    Congestive Heart Failure Mother    Aneurysm Paternal Uncle    Heart attack Maternal Grandmother    Heart attack Maternal Grandfather    Hypertension Son    Other Son 87       Kienbock's Disease   Liver disease Son 35       Gilbert's Disease   Lupus Half-Sister    Atrial fibrillation Half-Sister    Breast cancer Niece      Prior to Admission medications   Medication Sig Start Date End Date Taking? Authorizing Provider  ALPRAZolam  (XANAX ) 0.25 MG tablet Take 1 tablet (0.25 mg total) by mouth 2 (two) times daily as needed for anxiety. 10/22/24  Yes Will Almarie MATSU, MD  aspirin  EC 81 MG tablet TAKE 1 PO BID THRU 05/13/24 THEN RESUME 1 PO DAILY Patient taking differently: Take 81 mg by mouth daily. 04/16/24  Yes Johnson, Clanford L, MD  buPROPion  (WELLBUTRIN  XL) 150 MG 24 hr tablet Take 1 tablet (150 mg total) by mouth daily. 06/04/24  Yes Dettinger, Fonda LABOR, MD  carvedilol  (COREG ) 12.5 MG tablet Take 1 tablet (12.5 mg total) by mouth 2 (two) times daily with a meal. 10/22/24  Yes Will Almarie MATSU, MD  citalopram  (CELEXA ) 40 MG tablet Take 1 tablet (40 mg total) by mouth daily. 06/04/24  Yes Dettinger, Fonda LABOR, MD  cyclobenzaprine  (FLEXERIL ) 10 MG tablet Take 10 mg by mouth in the morning and at bedtime.   Yes [provider]  docusate sodium  (COLACE) 100 MG capsule Take 1 capsule (100 mg total) by mouth 2 (two) times daily. 04/16/24  Yes Johnson, Clanford L, MD  gabapentin (NEURONTIN) 100  MG tablet Take 100 mg by mouth 2 (two) times daily.   Yes [provider]  haloperidol lactate (HALDOL) 5 MG/ML injection Inject 5 mg into the muscle once.   Yes [provider]  ondansetron  (ZOFRAN ) 4 MG tablet Take 1 tablet (4 mg total) by mouth every 8 (eight) hours as needed for nausea or vomiting. 05/08/24  Yes Francesca Elsie CROME, MD  polyethylene glycol (MIRALAX  / GLYCOLAX ) 17 g packet Take 17 g by mouth daily as needed for mild constipation. 04/16/24  Yes Johnson, Clanford L, MD  pravastatin  (PRAVACHOL ) 40 MG tablet Take 1 tablet (40 mg total) by mouth every evening. Patient taking differently: Take 40 mg by mouth daily. 06/04/24  Yes Dettinger, Fonda LABOR, MD  traMADol  (ULTRAM ) 50 MG tablet Take 1 tablet (50 mg total) by mouth every 6 (six) hours as needed for severe pain (pain score 7-10). 10/22/24  Yes Will Almarie MATSU, MD  oxyCODONE  (OXY  IR/ROXICODONE ) 5 MG immediate release tablet Take 5 mg by mouth every 8 (eight) hours as needed for severe pain (pain score 7-10) or moderate pain (pain score 4-6). Patient not taking: Reported on 10/29/2024    [provider]    Physical Exam: Vitals:   10/29/24 0829 10/29/24 0900  BP: 138/69 123/76  Pulse: 91 92  Resp: (!) 9 17  Temp: 98.2 F (36.8 C)   TempSrc: Oral   SpO2: 94% 94%    Constitutional: NAD, calm, comfortable Vitals:   10/29/24 0829 10/29/24 0900  BP: 138/69 123/76  Pulse: 91 92  Resp: (!) 9 17  Temp: 98.2 F (36.8 C)   TempSrc: Oral   SpO2: 94% 94%   Eyes: PERRL, lids and conjunctivae normal ENMT: Mucous membranes are moist. Posterior pharynx clear of any exudate or lesions.Normal dentition.  Neck: normal, supple, no masses, no thyromegaly Respiratory: clear to auscultation bilaterally, no wheezing, no crackles. Normal respiratory effort. No accessory muscle use.  Cardiovascular: Regular rate and rhythm, no murmurs / rubs / gallops. No extremity edema. 2+ pedal pulses. No carotid bruits.   Abdomen: no tenderness, no masses palpated. No hepatosplenomegaly. Bowel sounds positive.  Musculoskeletal: no clubbing / cyanosis. No joint deformity upper and lower extremities. Good ROM, no contractures. Normal muscle tone.  Skin: no rashes, lesions, ulcers. No induration Neurologic: CN 2-12 grossly intact. Sensation intact, DTR normal. Strength 5/5 in all 4.  Psychiatric: Normal judgment and insight. Alert and oriented x 3. Normal mood.    Labs on Admission: I have personally reviewed following labs and imaging studies  CBC: Recent Labs  Lab 10/29/24 0847  WBC 11.4*  HGB 13.7  HCT 42.6  MCV 101.2*  PLT 460*   Basic Metabolic Panel: Recent Labs  Lab 10/29/24 0847  NA 138  K 4.3  CL 103  CO2 24  GLUCOSE 114*  BUN 14  CREATININE 0.65  CALCIUM  9.8   GFR: Estimated Creatinine Clearance: 52.6 mL/min (by C-G formula based on SCr of 0.65 mg/dL). Liver Function Tests: No results for input(s): AST, ALT, ALKPHOS, BILITOT, PROT, ALBUMIN in the last 168 hours. No results for input(s): LIPASE, AMYLASE in the last 168 hours. No results for input(s): AMMONIA in the last 168 hours. Coagulation Profile: No results for input(s): INR, PROTIME in the last 168 hours. Cardiac Enzymes: No results for input(s): CKTOTAL, CKMB, CKMBINDEX, TROPONINI in the last 168 hours. BNP (last 3 results) No results for input(s): PROBNP in the last 8760 hours. HbA1C: No results for input(s): HGBA1C in the last 72 hours. CBG: No results for input(s): GLUCAP in the last 168 hours. Lipid Profile: No results for input(s): CHOL, HDL, LDLCALC, TRIG, CHOLHDL, LDLDIRECT in the last 72 hours. Thyroid  Function Tests: No results for input(s): TSH, T4TOTAL, FREET4, T3FREE, THYROIDAB in the last 72 hours. Anemia Panel: No results for input(s): VITAMINB12, FOLATE, FERRITIN, TIBC, IRON, RETICCTPCT in the last 72 hours. Urine analysis:     Component Value Date/Time   COLORURINE YELLOW 10/29/2024 1022   APPEARANCEUR CLEAR 10/29/2024 1022   APPEARANCEUR Clear 01/22/2022 1411   LABSPEC 1.013 10/29/2024 1022   PHURINE 6.0 10/29/2024 1022   GLUCOSEU NEGATIVE 10/29/2024 1022   HGBUR NEGATIVE 10/29/2024 1022   BILIRUBINUR NEGATIVE 10/29/2024 1022   BILIRUBINUR Negative 01/22/2022 1411   KETONESUR NEGATIVE 10/29/2024 1022   PROTEINUR NEGATIVE 10/29/2024 1022   UROBILINOGEN negative 04/11/2014 1713   NITRITE NEGATIVE 10/29/2024 1022   LEUKOCYTESUR SMALL (A) 10/29/2024 1022    Radiological  Exams on Admission: CT Head Wo Contrast Result Date: 10/29/2024 EXAM: CT HEAD WITHOUT CONTRAST 10/29/2024 09:22:52 AM TECHNIQUE: CT of the head was performed without the administration of intravenous contrast. Automated exposure control, iterative reconstruction, and/or weight based adjustment of the mA/kV was utilized to reduce the radiation dose to as low as reasonably achievable. COMPARISON: MRI brain 10/18/2024 and CT head 10/16/2024. CLINICAL HISTORY: Head trauma, minor (Age >= 65y). FINDINGS: LIMITATIONS/ARTIFACTS: Exam is mildly degraded by motion artifact. BRAIN AND VENTRICLES: No acute intracranial hemorrhage. Scattered white matter hypodensities which are nonspecific but most commonly represent chronic microvascular ischemic changes. No evidence of acute infarct. No hydrocephalus. No extra-axial collection. No mass effect or midline shift. ORBITS: No acute abnormality. SINUSES: Partial opacification of the left sphenoid sinus. SOFT TISSUES AND SKULL: No acute soft tissue abnormality. No calvarial fracture. IMPRESSION: 1. No acute intracranial hemorrhage or calvarial fracture. Electronically signed by: prentice bybordi 10/29/2024 10:00 AM EST RP Workstation: [id number]   CT Lumbar Spine Wo Contrast Result Date: 10/29/2024 EXAM: CT OF THE LUMBAR SPINE WITHOUT CONTRAST 10/29/2024 09:22:52 AM TECHNIQUE: CT of the lumbar spine was performed  without the administration of intravenous contrast. Multiplanar reformatted images are provided for review. Automated exposure control, iterative reconstruction, and/or weight based adjustment of the mA/kV was utilized to reduce the radiation dose to as low as reasonably achievable. COMPARISON: CT of the pelvis 10/16/2024 and 10/03/2024. CLINICAL HISTORY: Back trauma, no prior imaging (Age >= 16y). FINDINGS: BONES AND ALIGNMENT: There are 5 lumbar vertebral bodies. The alignment is stable with a mild left convex scoliosis. There is no focal angulation or listhesis. No acute vertebral body fractures are identified. However, there are subacute fractures of the right L4 and bilateral L5 transverse processes, similar to previous examination. There is progressive displacement of subacute fractures of the sacral ala bilaterally, incompletely visualized . No widening of the visualized sacroiliac joints is demonstrated. DEGENERATIVE CHANGES: Multilevel spondylosis with advanced disc space narrowing and endplate osteophytes greatest at L2-L3 and L4-L5. L1-L2: Chronic loss of disc height with vacuum disc phenomenon and disc bulging eccentric to the left. No significant spinal stenosis or foraminal narrowing. L2-L3: Chronic loss of disc height with annular disc bulging and endplate osteophytes asymmetric to the right. Mild facet and ligamentous hypertrophy. Resulting mild spinal stenosis with mild right foraminal narrowing. L3-L4: Preserved disc height at this level with mild disc bulging. No significant spinal stenosis or foraminal narrowing. L4-L5: Chronic loss of disc height with annular disc bulging and endplate osteophytes asymmetric to the left. Moderate facet and ligamentous hypertrophy. Mild spinal stenosis with mild to moderate foraminal narrowing bilaterally. L5-S1: Preserved disc height with mild disc bulging and mild bilateral facet hypertrophy. No significant spinal stenosis or foraminal narrowing. SOFT TISSUES:  Aortic and branch vessel atherosclerosis. No evidence of periarticular hematoma. IMPRESSION: 1. Subacute fractures of the right L4 and bilateral L5 transverse processes, with progressive displacement compared to prior examinations. No evidence of vertebral body fracture. 2. Progressive displacement of previously demonstrated bilateral sacral fractures, incompletely visualized. 3. Multilevel spondylosis as detailed above. No high-grade spinal stenosis or high-grade foraminal narrowing identified. Electronically signed by: Elsie Perone MD 10/29/2024 09:53 AM EST RP Workstation: [id number]   DG Chest Port 1 View Result Date: 10/29/2024 CLINICAL DATA:  Concern for pneumonia. EXAM: PORTABLE CHEST 1 VIEW COMPARISON:  Chest radiograph dated 10/17/2024 FINDINGS: No focal consolidation, pleural effusion, pneumothorax. Stable cardiomegaly. Atherosclerotic calcification of the aorta. Hiatal hernia. No acute osseous pathology. IMPRESSION: 1. No active disease. 2. Cardiomegaly.  3. Hiatal hernia. Electronically Signed   By: Vanetta Chou M.D.   On: 10/29/2024 09:34    EKG: Independently reviewed.  Sinus rhythm, no acute ST-T changes.  Assessment/Plan Principal Problem:   AMS (altered mental status) Active Problems:   Altered mental status, unspecified  (please populate well all problems here in Problem List. (For example, if patient is on BP meds at home and you resume or decide to hold them, it is a problem that needs to be her. Same for CAD, COPD, HLD and so on)  Delirium Visual hallucination - Suspect polypharmacy especially narcotics interaction with SSRI, benzos as well as gabapentin. - Patient and family agreed on that I will hold off narcotics for now. - Will use small dose of Toradol  for severe breakthrough pain for now.  Discussed with patient and family regarding possible long-term pain management plan and safety of long-term use of narcotics such as oxycodone .  Recommend patient continue to  follow-up with orthopedic surgery/pain management to discuss about alternate pain management plan. - As needed Zyprexa for visual hallucination until mentation improves  HTN - Stable, continue Coreg   Anxiety/depression - Continue Celexa  and Wellbutrin  - As needed Xanax   Recent fracture of right pubic ramus, nondisplaced right sacral lateral and closed fracture of proximal right humerus -On loading dose of Tylenol  1000 mg every 8 hours - As needed Toradol  for now. - Appears that patient was also on tramadol , but decided to hold it for now as well.   DVT prophylaxis: Lovenox  Code Status: Full code Family Communication: Son at bedside Disposition Plan: Expect less than 2 midnight hospital stay Consults called: None Admission status: MedSurg observation   Cort ONEIDA Mana MD [facility] Pager [phone]  10/29/2024, 12:10 PM

## 2024-10-29 NOTE — ED Notes (Signed)
 High fall risk, precautions in place, non-skid footwear, armband, door sign and bed alarm.  Patient is confused.  Bed placed in foot up position to discourage getting out of bed.

## 2024-10-29 NOTE — Plan of Care (Signed)
  Problem: Education: Goal: Knowledge of General Education information will improve Description: Including pain rating scale, medication(s)/side effects and non-pharmacologic comfort measures Outcome: Progressing   Problem: Clinical Measurements: Goal: Will remain free from infection Outcome: Progressing   Problem: Nutrition: Goal: Adequate nutrition will be maintained Outcome: Progressing   Problem: Coping: Goal: Level of anxiety will decrease Outcome: Progressing   Problem: Elimination: Goal: Will not experience complications related to bowel motility Outcome: Progressing Goal: Will not experience complications related to urinary retention Outcome: Progressing   Problem: Safety: Goal: Ability to remain free from injury will improve Outcome: Progressing   Problem: Skin Integrity: Goal: Risk for impaired skin integrity will decrease Outcome: Progressing

## 2024-10-29 NOTE — ED Provider Notes (Signed)
 [place] [facility]ERGENC[facility] DEPARTMENT AT [facility] Provider Note   CSN: [id number] Arrival date & time: 10/29/24  9177     Patient presents with: Altered Mental Status   Jody Wilson is a 73 y.o. female.   73 year old female with past medical history of hypertension hyperlipidemia with recent admission for pneumonia and delirium associated with this presenting to the emergency department today with altered mental status.  The patient had a fall a few weeks ago and had a hip fracture and shoulder fracture.  She has been at a skilled nursing facility since then.  She reports that over the past few days she has had some shortness of breath and minimal cough.  Denies any fevers but has had some chills.  She denies any associated urinary symptoms.  She was found to be intermittently confused this morning.  She was alert and oriented x 4 with medics but was apparently trying to talk on the phone that was not there on the way to the emergency department but is now oriented and the confusion has improved.   Altered Mental Status Presenting symptoms: confusion        Prior to Admission medications   Medication Sig Start Date End Date Taking? Authorizing Provider  acetaminophen  (TYLENOL ) 500 MG tablet Take 2 tablets (1,000 mg total) by mouth 3 (three) times daily. 10/22/24 10/22/25  Will Almarie MATSU, MD  ALPRAZolam  (XANAX ) 0.25 MG tablet Take 1 tablet (0.25 mg total) by mouth 2 (two) times daily as needed for anxiety. 10/22/24   Will Almarie MATSU, MD  aspirin  EC 81 MG tablet TAKE 1 PO BID THRU 05/13/24 THEN RESUME 1 PO DAILY Patient taking differently: Take 81 mg by mouth daily. 04/16/24   Johnson, Clanford L, MD  buPROPion  (WELLBUTRIN  XL) 150 MG 24 hr tablet Take 1 tablet (150 mg total) by mouth daily. 06/04/24   Dettinger, Fonda LABOR, MD  carvedilol  (COREG ) 12.5 MG tablet Take 1 tablet (12.5 mg total) by mouth 2 (two) times daily with a meal. 10/22/24   Will Almarie MATSU, MD   citalopram  (CELEXA ) 40 MG tablet Take 1 tablet (40 mg total) by mouth daily. 06/04/24   Dettinger, Fonda LABOR, MD  cyclobenzaprine  (FLEXERIL ) 10 MG tablet Take 10 mg by mouth in the morning and at bedtime.    [provider]  docusate sodium  (COLACE) 100 MG capsule Take 1 capsule (100 mg total) by mouth 2 (two) times daily. 04/16/24   Johnson, Clanford L, MD  ondansetron  (ZOFRAN ) 4 MG tablet Take 1 tablet (4 mg total) by mouth every 8 (eight) hours as needed for nausea or vomiting. 05/08/24   Francesca Elsie CROME, MD  polyethylene glycol (MIRALAX  / GLYCOLAX ) 17 g packet Take 17 g by mouth daily as needed for mild constipation. 04/16/24   Johnson, Clanford L, MD  pravastatin  (PRAVACHOL ) 40 MG tablet Take 1 tablet (40 mg total) by mouth every evening. Patient taking differently: Take 40 mg by mouth daily. 06/04/24   Dettinger, Fonda LABOR, MD  traMADol  (ULTRAM ) 50 MG tablet Take 1 tablet (50 mg total) by mouth every 6 (six) hours as needed for severe pain (pain score 7-10). 10/22/24   Will Almarie MATSU, MD    Allergies: Tetracyclines & related    Review of Systems  Psychiatric/Behavioral:  Positive for confusion.   All other systems reviewed and are negative.   Updated Vital Signs BP 123/76   Pulse 92   Temp 98.2 F (36.8 C) (Oral)   Resp  17   SpO2 94%   Physical Exam Vitals and nursing note reviewed.   Gen: NAD Eyes: PERRL, EOMI HEENT: no oropharyngeal swelling Neck: trachea midline, no meningismus Resp: clear to auscultation bilaterally Card: RRR, no murmurs, rubs, or gallops Abd: nontender, nondistended Extremities: no calf tenderness, no edema Vascular: 2+ radial pulses bilaterally, 2+ DP pulses bilaterally MSK: The patient is tender over the mid and lower lumbar spine with no step-offs or deformities noted Neuro: Alert and oriented x 4, cranial nerves intact, equal strength sensation throughout bilateral upper and lower extremities Skin: Diaphoretic on lower back when  further evaluating for low back pain Psyc: acting appropriately   (all labs ordered are listed, but only abnormal results are displayed) Labs Reviewed  BASIC METABOLIC PANEL WITH GFR - Abnormal; Notable for the following components:      Result Value   Glucose, Bld 114 (*)    All other components within normal limits  CBC - Abnormal; Notable for the following components:   WBC 11.4 (*)    MCV 101.2 (*)    Platelets 460 (*)    All other components within normal limits  URINALYSIS, ROUTINE W REFLEX MICROSCOPIC - Abnormal; Notable for the following components:   Leukocytes,Ua SMALL (*)    All other components within normal limits  RESP PANEL BY RT-PCR (RSV, FLU A&B, COVID)  RVPGX2  AMMONIA    EKG: EKG Interpretation Date/Time:  Monday October 29 2024 08:47:41 EST Ventricular Rate:  91 PR Interval:  128 QRS Duration:  136 QT Interval:  411 QTC Calculation: 506 R Axis:   55  Text Interpretation: Sinus rhythm Non-specific intra-ventricular conduction delay Left bundle branch block Artifact in lead(s) I II III aVR aVL aVF V1 V3 and baseline wander in lead(s) V4 Confirmed by Ula Barter [phone]) on 10/29/2024 8:59:19 AM  Radiology: CT Head Wo Contrast Result Date: 10/29/2024 EXAM: CT HEAD WITHOUT CONTRAST 10/29/2024 09:22:52 AM TECHNIQUE: CT of the head was performed without the administration of intravenous contrast. Automated exposure control, iterative reconstruction, and/or weight based adjustment of the mA/kV was utilized to reduce the radiation dose to as low as reasonably achievable. COMPARISON: MRI brain 10/18/2024 and CT head 10/16/2024. CLINICAL HISTORY: Head trauma, minor (Age >= 65y). FINDINGS: LIMITATIONS/ARTIFACTS: Exam is mildly degraded by motion artifact. BRAIN AND VENTRICLES: No acute intracranial hemorrhage. Scattered white matter hypodensities which are nonspecific but most commonly represent chronic microvascular ischemic changes. No evidence of acute infarct. No  hydrocephalus. No extra-axial collection. No mass effect or midline shift. ORBITS: No acute abnormality. SINUSES: Partial opacification of the left sphenoid sinus. SOFT TISSUES AND SKULL: No acute soft tissue abnormality. No calvarial fracture. IMPRESSION: 1. No acute intracranial hemorrhage or calvarial fracture. Electronically signed by: barter bybordi 10/29/2024 10:00 AM EST RP Workstation: [id number]   CT Lumbar Spine Wo Contrast Result Date: 10/29/2024 EXAM: CT OF THE LUMBAR SPINE WITHOUT CONTRAST 10/29/2024 09:22:52 AM TECHNIQUE: CT of the lumbar spine was performed without the administration of intravenous contrast. Multiplanar reformatted images are provided for review. Automated exposure control, iterative reconstruction, and/or weight based adjustment of the mA/kV was utilized to reduce the radiation dose to as low as reasonably achievable. COMPARISON: CT of the pelvis 10/16/2024 and 10/03/2024. CLINICAL HISTORY: Back trauma, no prior imaging (Age >= 16y). FINDINGS: BONES AND ALIGNMENT: There are 5 lumbar vertebral bodies. The alignment is stable with a mild left convex scoliosis. There is no focal angulation or listhesis. No acute vertebral body fractures are identified. However, there are  subacute fractures of the right L4 and bilateral L5 transverse processes, similar to previous examination. There is progressive displacement of subacute fractures of the sacral ala bilaterally, incompletely visualized . No widening of the visualized sacroiliac joints is demonstrated. DEGENERATIVE CHANGES: Multilevel spondylosis with advanced disc space narrowing and endplate osteophytes greatest at L2-L3 and L4-L5. L1-L2: Chronic loss of disc height with vacuum disc phenomenon and disc bulging eccentric to the left. No significant spinal stenosis or foraminal narrowing. L2-L3: Chronic loss of disc height with annular disc bulging and endplate osteophytes asymmetric to the right. Mild facet and ligamentous  hypertrophy. Resulting mild spinal stenosis with mild right foraminal narrowing. L3-L4: Preserved disc height at this level with mild disc bulging. No significant spinal stenosis or foraminal narrowing. L4-L5: Chronic loss of disc height with annular disc bulging and endplate osteophytes asymmetric to the left. Moderate facet and ligamentous hypertrophy. Mild spinal stenosis with mild to moderate foraminal narrowing bilaterally. L5-S1: Preserved disc height with mild disc bulging and mild bilateral facet hypertrophy. No significant spinal stenosis or foraminal narrowing. SOFT TISSUES: Aortic and branch vessel atherosclerosis. No evidence of periarticular hematoma. IMPRESSION: 1. Subacute fractures of the right L4 and bilateral L5 transverse processes, with progressive displacement compared to prior examinations. No evidence of vertebral body fracture. 2. Progressive displacement of previously demonstrated bilateral sacral fractures, incompletely visualized. 3. Multilevel spondylosis as detailed above. No high-grade spinal stenosis or high-grade foraminal narrowing identified. Electronically signed by: Elsie Perone MD 10/29/2024 09:53 AM EST RP Workstation: [id number]   DG Chest Port 1 View Result Date: 10/29/2024 CLINICAL DATA:  Concern for pneumonia. EXAM: PORTABLE CHEST 1 VIEW COMPARISON:  Chest radiograph dated 10/17/2024 FINDINGS: No focal consolidation, pleural effusion, pneumothorax. Stable cardiomegaly. Atherosclerotic calcification of the aorta. Hiatal hernia. No acute osseous pathology. IMPRESSION: 1. No active disease. 2. Cardiomegaly. 3. Hiatal hernia. Electronically Signed   By: Vanetta Chou M.D.   On: 10/29/2024 09:34     Procedures   Medications Ordered in the ED - No data to display                                  Medical Decision Making 73 year old female with past medical history of hypertension hyperlipidemia with recent admission for delirium and pneumonia presents emergency  department today with confusion.  I will further evaluate the patient here with basic labs to evaluate for electrolyte abnormalities.  Will obtain a urinalysis, chest x-ray, and RSV/COVID/flu swab to evaluate for infectious etiologies.  Will obtain a CT scan of the patient's head and lumbar spine to eval for acute traumatic injuries from her fall yesterday.  I will reevaluate for ultimate disposition.  She does have waxing and waning altered mental status which may be due to delirium.  I will reevaluate for ultimate disposition but she very well may require admission.  The patient's work appears reassuring with no obvious reason for her intermittent confusion.  An ammonia level was added on.  Given her waxing waning mental status calls placed to hospitalist for further observation.  The patient has been on stronger pain medications.  Her CT scan shows some transverse process fractures with mild displacement and the known sacral fractures.  Amount and/or Complexity of Data Reviewed Labs: ordered. Radiology: ordered.  Risk Decision regarding hospitalization.        Final diagnoses:  Delirium    ED Discharge Orders     None  Ula Prentice SAUNDERS, MD 10/29/24 1130

## 2024-10-30 DIAGNOSIS — R441 Visual hallucinations: Secondary | ICD-10-CM | POA: Insufficient documentation

## 2024-10-30 DIAGNOSIS — S32591D Other specified fracture of right pubis, subsequent encounter for fracture with routine healing: Secondary | ICD-10-CM | POA: Diagnosis not present

## 2024-10-30 DIAGNOSIS — G929 Unspecified toxic encephalopathy: Secondary | ICD-10-CM

## 2024-10-30 DIAGNOSIS — Z79899 Other long term (current) drug therapy: Secondary | ICD-10-CM | POA: Diagnosis not present

## 2024-10-30 MED ORDER — CARMEX CLASSIC LIP BALM EX OINT
TOPICAL_OINTMENT | CUTANEOUS | Status: DC | PRN
  Filled 2024-10-30: qty 10

## 2024-10-30 NOTE — Plan of Care (Signed)

## 2024-10-30 NOTE — Care Management Obs Status (Signed)
 MEDICARE OBSERVATION STATUS NOTIFICATION   Patient Details  Name: Jody Wilson MRN: [id number] Date of Birth: 10/06/1951   Medicare Observation Status Notification Given:  Yes    Krissa Utke A Ilea Hilton, LCSW 10/30/2024, 10:41 AM

## 2024-10-30 NOTE — Progress Notes (Signed)
  Progress Note   Patient: Jody Wilson FMW:[id number] DOB: May 21, 1951 DOA: 10/29/2024     0 DOS: the patient was seen and examined on 10/30/2024   Brief hospital course: 73 year old woman with a recent closed fracture right humerus, vertebral and pubic fractures in October, sent from nursing home for evaluation of altered mental status with hallucinations.  This was also seen on previous admission was thought to be secondary to polypharmacy, particularly oxycodone .  Oxycodone  was stopped on that hospitalization, patient's mentation improved and was discharged.  However at the nursing home she was started on oxycodone  for uncontrolled pain, started to hallucinate and was sent to the emergency department.  Admitted for acute toxic encephalopathy.  Consultants None  Procedures/Events   Assessment and Plan: Acute toxic encephalopathy Visual hallucinations Polypharmacy Although the patient is on multiple medications, most likely this is secondary to oxycodone  based on history. Oxycodone  discontinued, however have also discontinued gabapentin, tramadol  and cyclobenzaprine  Mentation substantially improved today  Continue to monitor, if remains stable tomorrow can likely return to SNF. Add oxycodone  to allergy list  Subacute fractures right L4, bilateral L5 transverse processes Subacute bilateral sacral fractures Continue follow-up with orthopedics Continue Tylenol   Anxiety Depression Continue Celexa  and Wellbutrin  Continue Xanax  although if at all possible recommend weaning this medication carefully over time on the day of the direction of her physician     Subjective:  Feels ok today  Physical Exam: Vitals:   10/29/24 1515 10/29/24 2042 10/29/24 2347 10/30/24 0411  BP: 125/84 111/69 (!) 116/95 (!) 161/87  Pulse: 86 72 72 83  Resp: 15 17 18 16   Temp: 97.9 F (36.6 C) 98 F (36.7 C) (!) 97.4 F (36.3 C) (!) 97.4 F (36.3 C)  TempSrc:  Oral Oral Oral  SpO2: 95% 92% 93%  93%  Weight:      Height:       Physical Exam Vitals reviewed.  Constitutional:      General: She is not in acute distress.    Appearance: She is not ill-appearing or toxic-appearing.  Cardiovascular:     Rate and Rhythm: Normal rate and regular rhythm.     Heart sounds: No murmur heard. Pulmonary:     Effort: Pulmonary effort is normal. No respiratory distress.     Breath sounds: No wheezing, rhonchi or rales.  Neurological:     Mental Status: She is alert and oriented to person, place, and time.  Psychiatric:        Mood and Affect: Mood normal.        Behavior: Behavior normal.     Data Reviewed: BMP unremarkable on admission, serum ammonia was within normal limits, CBC with minimal leukocytosis likely trivial, respiratory viral test were negative, urinalysis was unremarkable.   Family Communication: none  Disposition: Status is: Observation      Time spent: 35 minutes  Author: Toribio Door, MD 10/30/2024 4:32 PM  For on call review [email or web address].

## 2024-10-30 NOTE — Hospital Course (Signed)
 73 year old woman with a recent closed fracture right humerus, vertebral and pubic fractures in October, sent from nursing home for evaluation of altered mental status with hallucinations.  This was also seen on previous admission was thought to be secondary to polypharmacy, particularly oxycodone .  Oxycodone  was stopped on that hospitalization, patient's mentation improved and was discharged.  However at the nursing home she was started on oxycodone  for uncontrolled pain, started to hallucinate and was sent to the emergency department.  Admitted for acute toxic encephalopathy.  Consultants None  Procedures/Events

## 2024-10-30 NOTE — Plan of Care (Signed)

## 2024-10-30 NOTE — TOC Initial Note (Signed)
 Transition of Care [facility]) - Initial/Assessment Note    Patient Details  Name: Jody Wilson MRN: [id number] Date of Birth: [birth year]  [facility]) CM/SW Contact:    Heather DELENA Saltness, LCSW Phone Number: 10/30/2024, 10:43 AM  Clinical Narrative:                 Pt admitted to hospital from [facility] for altered mental status. CSW spoke with pt's son, Baker Kogler [phone], via phone call to discuss discharge planning. Pt's son reports pt has been admitted to hospital from [facility] 2x for AMS due to being given narcotics. Pt's son expressed concerns and states pt will not return to [facility] upon discharge. D/C plan is TBD. Pt's son reports needing time to discuss plan with brother. TOC will continue to follow.  Expected Discharge Plan: Home/Self Care Barriers to Discharge: Continued Medical Work up   Patient Goals and CMS Choice Patient states their goals for this hospitalization and ongoing recovery are:: TBD, home or SNF CMS Medicare.gov Compare Post Acute Care list provided to:: Patient Represenative (must comment) Choice offered to / list presented to : Adult Children [place] ownership interest in [facility].provided to:: Adult Children    Expected Discharge Plan and Services In-house Referral: Clinical Social Work Discharge Planning Services: NA Post Acute Care Choice: NA Living arrangements for the past 2 months: Single Family Home                 DME Arranged: N/A DME Agency: NA       HH Arranged: NA HH Agency: NA        Prior Living Arrangements/Services Living arrangements for the past 2 months: Single Family Home Lives with:: Self, Adult Children Patient language and need for interpreter reviewed:: Yes Do you feel safe going back to the place where you live?: Yes      Need for Family Participation in Patient Care: Yes (Comment) Care giver support system in place?: Yes (comment) Current home services:  DME Criminal Activity/Legal Involvement Pertinent to Current Situation/Hospitalization: No - Comment as needed  Activities of Daily Living   ADL Screening (condition at time of admission) Independently performs ADLs?: No Does the patient have a NEW difficulty with bathing/dressing/toileting/self-feeding that is expected to last >3 days?: No Does the patient have a NEW difficulty with getting in/out of bed, walking, or climbing stairs that is expected to last >3 days?: No Does the patient have a NEW difficulty with communication that is expected to last >3 days?: No Is the patient deaf or have difficulty hearing?: No Does the patient have difficulty seeing, even when wearing glasses/contacts?: No Does the patient have difficulty concentrating, remembering, or making decisions?: Yes  Permission Sought/Granted Permission sought to share information with : Family Supports Permission granted to share information with : Yes, Verbal Permission Granted  Share Information with NAME: Lachell Rochette     Permission granted to share info w Relationship: Son  Permission granted to share info w Contact Information: [phone]  Emotional Assessment Appearance:: Appears stated age Attitude/Demeanor/Rapport: Unable to Assess Affect (typically observed): Unable to Assess Orientation: : Oriented to Self, Oriented to Place Alcohol / Substance Use: Not Applicable Psych Involvement: No (comment)  Admission diagnosis:  Delirium [R41.0] AMS (altered mental status) [R41.82] Patient Active Problem List   Diagnosis Date Noted   AMS (altered mental status) 10/29/2024   Dehydration 10/18/2024   Community acquired pneumonia 10/16/2024   Altered mental status,  unspecified 10/16/2024   History of bradycardia 10/16/2024   Sepsis (HCC) 10/16/2024   Closed fracture of proximal end of right humerus 10/03/2024   Closed fracture of right inferior pubic ramus (HCC) 10/03/2024   Sinus bradycardia 10/03/2024    NSTEMI (non-ST elevated myocardial infarction) (HCC) 06/29/2024   Syncope 06/27/2024   Elevated troponin 06/27/2024   Acute respiratory failure with hypoxia (HCC) 06/27/2024   Abnormal CT of liver 06/26/2024   Incontinence of feces 05/08/2024   Closed fracture of neck of left femur (HCC) 04/13/2024   Hypokalemia 04/13/2024   Closed hip fracture requiring operative repair with routine healing 02/09/2023   Pulmonary nodule 02/09/2023   Hypertriglyceridemia 01/22/2022   Abnormal findings on diagnostic imaging of liver and biliary tract 12/09/2021   Diarrhea 12/09/2021   History of colonic polyps 12/09/2021   Anxiety and depression 06/23/2020   Hiatal hernia 06/23/2020   Thoracic aortic atherosclerosis 06/23/2020   Smoking history 04/25/2020   Involuntary muscle jerks while sleeping 09/26/2019   B12 deficiency 09/25/2019   Iron deficiency anemia 09/25/2019   Elevated liver function tests 09/07/2019   Osteoporosis 02/09/2017   History of vertebral fracture 02/09/2017   GERD (gastroesophageal reflux disease) 03/11/2016   Hyperlipidemia 02/19/2015   Essential hypertension 03/18/2014   PCP:  Dettinger, Fonda LABOR, MD Pharmacy:  No Pharmacies Listed    Social Drivers of Health (SDOH) Social History: SDOH Screenings   Food Insecurity: No Food Insecurity (10/29/2024)  Housing: Low Risk  (10/29/2024)  Transportation Needs: No Transportation Needs (10/29/2024)  Utilities: Not At Risk (10/29/2024)  Alcohol Screen: Low Risk  (12/27/2023)  Depression (PHQ2-9): Low Risk  (07/24/2024)  Recent Concern: Depression (PHQ2-9) - High Risk (07/06/2024)  Financial Resource Strain: Low Risk  (12/27/2023)  Physical Activity: Inactive (12/27/2023)  Social Connections: Patient Declined (10/29/2024)  Recent Concern: Social Connections - Moderately Isolated (10/16/2024)  Stress: No Stress Concern Present (12/27/2023)  Tobacco Use: Medium Risk (10/29/2024)  Health Literacy: Adequate Health Literacy (12/27/2023)    SDOH Interventions: None     Readmission Risk Interventions    10/22/2024    2:33 PM 04/13/2024    9:01 AM  Readmission Risk Prevention Plan  Medication Screening  Complete  Transportation Screening Complete Complete  HRI or Home Care Consult Complete   Social Work Consult for Recovery Care Planning/Counseling Complete   Palliative Care Screening Not Applicable   Medication Review Oceanographer) Complete     Signed: Heather Saltness, MSW, LCSW Clinical Social Worker Inpatient Care Management 10/30/2024 10:48 AM

## 2024-10-31 ENCOUNTER — Observation Stay (HOSPITAL_COMMUNITY)

## 2024-10-31 DIAGNOSIS — R410 Disorientation, unspecified: Secondary | ICD-10-CM | POA: Diagnosis not present

## 2024-10-31 MED ORDER — GABAPENTIN 100 MG PO CAPS
100.0000 mg | ORAL_CAPSULE | Freq: Two times a day (BID) | ORAL | Status: DC
  Administered 2024-10-31 – 2024-11-03 (×7): 100 mg via ORAL
  Filled 2024-10-31 (×7): qty 1

## 2024-10-31 MED ORDER — ENSURE PLUS HIGH PROTEIN PO LIQD
237.0000 mL | Freq: Two times a day (BID) | ORAL | Status: DC
  Administered 2024-11-01 – 2024-11-05 (×8): 237 mL via ORAL

## 2024-10-31 MED ORDER — IBUPROFEN 800 MG PO TABS: 800.0000 mg | ORAL_TABLET | Freq: Three times a day (TID) | ORAL | 0 refills | Status: DC | PRN

## 2024-10-31 MED ORDER — ACETAMINOPHEN 500 MG PO TABS: 1000.0000 mg | ORAL_TABLET | Freq: Three times a day (TID) | ORAL | 0 refills | Status: AC

## 2024-10-31 MED ORDER — ALUM & MAG HYDROXIDE-SIMETH 200-200-20 MG/5ML PO SUSP
30.0000 mL | ORAL | Status: DC | PRN
  Administered 2024-10-31 – 2024-11-01 (×3): 30 mL via ORAL
  Filled 2024-10-31 (×3): qty 30

## 2024-10-31 NOTE — Discharge Summary (Addendum)
 Physician Discharge Summary   Patient: Jody Wilson MRN: [id number] DOB: 05-11-1951  Admit date:     10/29/2024  Discharge date: 10/31/24  Discharge Physician: Owen DELENA Lore   PCP: Dettinger, Fonda DELENA, MD   Recommendations at discharge:   Please monitor patient on resumption of Gabapentin. If hallucination reoccurs , stop gabapentin.  Use tylenol  and or Ibuprofen for pain management.  Avoid Opioids.   Discharge Diagnoses: Principal Problem:   Toxic encephalopathy Active Problems:   Anxiety   Closed fracture of right inferior pubic ramus (HCC)   Visual hallucination   Polypharmacy  Resolved Problems:   * No resolved hospital problems. *  Hospital Course: 73 year old woman with a recent closed fracture right humerus, vertebral and pubic fractures in October, sent from nursing home for evaluation of altered mental status with hallucinations. This was also seen on previous admission was thought to be secondary to polypharmacy, particularly oxycodone . Oxycodone  was stopped on that hospitalization, patient's mentation improved and was discharged. However at the nursing home she was started on oxycodone  for uncontrolled pain, started to hallucinate and was sent to the emergency department. Admitted for acute toxic encephalopathy.    Assessment and Plan: 1-Acute toxic encephalopathy Visual  hallucinations Polypharmacy -Patient presents with hallucination and confusion, in the setting of polypharmacy: Oxycodone  and tramadol  and Flexeril  discontinued. Is back to baseline. She still having neuropathic pain, will resume gabapentin at any signs of confusion or hallucination will need to stop gabapentin.  Subacute fracture of right L4, bilateral L5 transverse process Acute bilateral sacral fractures History Right humerus fracture. Repeat X ray .  - Continue follow-up with orthopedic as an outpatient - Tylenol  for pain  Anxiety depression Continue Celexa  and Wellbutrin  On  Xanax  as needed, will recommend to wean this medication over time       Consultants: None Procedures performed: none Disposition: Skilled nursing facility Diet recommendation:  Discharge Diet Orders (From admission, onward)     Start     Ordered   10/31/24 0000  Diet - low sodium heart healthy        10/31/24 1018           Cardiac diet DISCHARGE MEDICATION: Allergies as of 10/31/2024       Reactions   Oxycodone  Other (See Comments)   Encephalopathy and hallucinations   Tetracyclines & Related Itching, Swelling        Medication List     STOP taking these medications    cyclobenzaprine  10 MG tablet Commonly known as: FLEXERIL    haloperidol lactate 5 MG/ML injection Commonly known as: HALDOL   oxyCODONE  5 MG immediate release tablet Commonly known as: Oxy IR/ROXICODONE    traMADol  50 MG tablet Commonly known as: ULTRAM        TAKE these medications    acetaminophen  500 MG tablet Commonly known as: TYLENOL  Take 2 tablets (1,000 mg total) by mouth 3 (three) times daily.   ALPRAZolam  0.25 MG tablet Commonly known as: XANAX  Take 1 tablet (0.25 mg total) by mouth 2 (two) times daily as needed for anxiety.   aspirin  EC 81 MG tablet TAKE 1 PO BID THRU 05/13/24 THEN RESUME 1 PO DAILY What changed:  how much to take how to take this when to take this additional instructions   buPROPion  150 MG 24 hr tablet Commonly known as: Wellbutrin  XL Take 1 tablet (150 mg total) by mouth daily.   carvedilol  12.5 MG tablet Commonly known as: COREG  Take 1 tablet (12.5 mg total) by mouth  2 (two) times daily with a meal.   citalopram  40 MG tablet Commonly known as: CELEXA  Take 1 tablet (40 mg total) by mouth daily.   docusate sodium  100 MG capsule Commonly known as: COLACE Take 1 capsule (100 mg total) by mouth 2 (two) times daily.   gabapentin 100 MG tablet Commonly known as: NEURONTIN Take 100 mg by mouth 2 (two) times daily.   ibuprofen 800 MG  tablet Commonly known as: ADVIL Take 1 tablet (800 mg total) by mouth every 8 (eight) hours as needed.   ondansetron  4 MG tablet Commonly known as: ZOFRAN  Take 1 tablet (4 mg total) by mouth every 8 (eight) hours as needed for nausea or vomiting.   polyethylene glycol 17 g packet Commonly known as: MIRALAX  / GLYCOLAX  Take 17 g by mouth daily as needed for mild constipation.   pravastatin  40 MG tablet Commonly known as: PRAVACHOL  Take 1 tablet (40 mg total) by mouth every evening. What changed: when to take this        Discharge Exam: Filed Weights   10/29/24 1515  Weight: 55.8 kg   General; NAD  Condition at discharge: stable  The results of significant diagnostics from this hospitalization (including imaging, microbiology, ancillary and laboratory) are listed below for reference.   Imaging Studies: CT Head Wo Contrast Result Date: 10/29/2024 EXAM: CT HEAD WITHOUT CONTRAST 10/29/2024 09:22:52 AM TECHNIQUE: CT of the head was performed without the administration of intravenous contrast. Automated exposure control, iterative reconstruction, and/or weight based adjustment of the mA/kV was utilized to reduce the radiation dose to as low as reasonably achievable. COMPARISON: MRI brain 10/18/2024 and CT head 10/16/2024. CLINICAL HISTORY: Head trauma, minor (Age >= 65y). FINDINGS: LIMITATIONS/ARTIFACTS: Exam is mildly degraded by motion artifact. BRAIN AND VENTRICLES: No acute intracranial hemorrhage. Scattered white matter hypodensities which are nonspecific but most commonly represent chronic microvascular ischemic changes. No evidence of acute infarct. No hydrocephalus. No extra-axial collection. No mass effect or midline shift. ORBITS: No acute abnormality. SINUSES: Partial opacification of the left sphenoid sinus. SOFT TISSUES AND SKULL: No acute soft tissue abnormality. No calvarial fracture. IMPRESSION: 1. No acute intracranial hemorrhage or calvarial fracture. Electronically  signed by: prentice bybordi 10/29/2024 10:00 AM EST RP Workstation: [id number]   CT Lumbar Spine Wo Contrast Result Date: 10/29/2024 EXAM: CT OF THE LUMBAR SPINE WITHOUT CONTRAST 10/29/2024 09:22:52 AM TECHNIQUE: CT of the lumbar spine was performed without the administration of intravenous contrast. Multiplanar reformatted images are provided for review. Automated exposure control, iterative reconstruction, and/or weight based adjustment of the mA/kV was utilized to reduce the radiation dose to as low as reasonably achievable. COMPARISON: CT of the pelvis 10/16/2024 and 10/03/2024. CLINICAL HISTORY: Back trauma, no prior imaging (Age >= 16y). FINDINGS: BONES AND ALIGNMENT: There are 5 lumbar vertebral bodies. The alignment is stable with a mild left convex scoliosis. There is no focal angulation or listhesis. No acute vertebral body fractures are identified. However, there are subacute fractures of the right L4 and bilateral L5 transverse processes, similar to previous examination. There is progressive displacement of subacute fractures of the sacral ala bilaterally, incompletely visualized . No widening of the visualized sacroiliac joints is demonstrated. DEGENERATIVE CHANGES: Multilevel spondylosis with advanced disc space narrowing and endplate osteophytes greatest at L2-L3 and L4-L5. L1-L2: Chronic loss of disc height with vacuum disc phenomenon and disc bulging eccentric to the left. No significant spinal stenosis or foraminal narrowing. L2-L3: Chronic loss of disc height with annular disc bulging and endplate osteophytes  asymmetric to the right. Mild facet and ligamentous hypertrophy. Resulting mild spinal stenosis with mild right foraminal narrowing. L3-L4: Preserved disc height at this level with mild disc bulging. No significant spinal stenosis or foraminal narrowing. L4-L5: Chronic loss of disc height with annular disc bulging and endplate osteophytes asymmetric to the left. Moderate facet and  ligamentous hypertrophy. Mild spinal stenosis with mild to moderate foraminal narrowing bilaterally. L5-S1: Preserved disc height with mild disc bulging and mild bilateral facet hypertrophy. No significant spinal stenosis or foraminal narrowing. SOFT TISSUES: Aortic and branch vessel atherosclerosis. No evidence of periarticular hematoma. IMPRESSION: 1. Subacute fractures of the right L4 and bilateral L5 transverse processes, with progressive displacement compared to prior examinations. No evidence of vertebral body fracture. 2. Progressive displacement of previously demonstrated bilateral sacral fractures, incompletely visualized. 3. Multilevel spondylosis as detailed above. No high-grade spinal stenosis or high-grade foraminal narrowing identified. Electronically signed by: Elsie Perone MD 10/29/2024 09:53 AM EST RP Workstation: [id number]   DG Chest Port 1 View Result Date: 10/29/2024 CLINICAL DATA:  Concern for pneumonia. EXAM: PORTABLE CHEST 1 VIEW COMPARISON:  Chest radiograph dated 10/17/2024 FINDINGS: No focal consolidation, pleural effusion, pneumothorax. Stable cardiomegaly. Atherosclerotic calcification of the aorta. Hiatal hernia. No acute osseous pathology. IMPRESSION: 1. No active disease. 2. Cardiomegaly. 3. Hiatal hernia. Electronically Signed   By: Vanetta Chou M.D.   On: 10/29/2024 09:34   EEG adult Result Date: 10/18/2024 Shelton Arlin KIDD, MD     10/18/2024  4:57 PM Patient Name: RETIA CORDLE MRN: [id number] Epilepsy Attending: Arlin KIDD Shelton Referring Physician/Provider: Will Almarie MATSU, MD Date: 10/18/2024 Duration: 22.43 mins Patient history: 72yo f with ams. EEG to evaluate for seizure Level of alertness: Awake, asleep AEDs during EEG study: None Technical aspects: This EEG study was done with scalp electrodes positioned according to the 10-20 International system of electrode placement. Electrical activity was reviewed with band pass filter of 1-70Hz , sensitivity of 7  uV/mm, display speed of 18mm/sec with a 60Hz  notched filter applied as appropriate. EEG data were recorded continuously and digitally stored.  Video monitoring was available and reviewed as appropriate. Description: The posterior dominant rhythm consists of 8 Hz activity of moderate voltage (25-35 uV) seen predominantly in posterior head regions, symmetric and reactive to eye opening and eye closing. Sleep was characterized by vertex waves, sleep spindles (12 to 14 Hz), maximal frontocentral region. Physiologic photic driving was not seen during photic stimulation. Hyperventilation was not performed.   IMPRESSION: This study is within normal limits. No seizures or epileptiform discharges were seen throughout the recording. A normal interictal EEG does not exclude the diagnosis of epilepsy. Arlin KIDD Shelton   MR BRAIN W WO CONTRAST Result Date: 10/18/2024 EXAM: MRI BRAIN WITH AND WITHOUT CONTRAST 10/18/2024 12:24:58 PM TECHNIQUE: Multiplanar multisequence MRI of the head/brain was performed with and without the administration of 6 mL gadobutrol  (GADAVIST ) 1 MMOL/ML injection. COMPARISON: 09/07/2022 CLINICAL HISTORY: Mental status change, unknown cause. FINDINGS: BRAIN AND VENTRICLES: No acute infarct. No acute intracranial hemorrhage. No mass effect or midline shift. No hydrocephalus. The sella is unremarkable. Normal flow voids. No mass or abnormal enhancement. Multifocal hyperintense T2-weighted signal within the cerebral white matter, most commonly due to chronic small vessel disease. ORBITS: No acute abnormality. SINUSES: No acute abnormality. BONES AND SOFT TISSUES: Normal bone marrow signal and enhancement. No acute soft tissue abnormality. IMPRESSION: 1. No acute intracranial abnormality. 2. Multifocal T2 hyperintense white matter signal most consistent with chronic small vessel disease. Electronically signed by:  Franky Stanford MD 10/18/2024 12:46 PM EDT RP Workstation: [id number]   DG Chest 1 View Result  Date: 10/17/2024 EXAM: 1 VIEW XRAY OF THE CHEST 10/17/2024 11:31:00 AM COMPARISON: None available. CLINICAL HISTORY: SOB (shortness of breath) [id number]. FINDINGS: LUNGS AND PLEURA: Chronic coarsened interstitial markings without pulmonary edema. No pleural effusion. No pneumothorax. HEART AND MEDIASTINUM: Atherosclerotic plaque. Large Hiatal hernia. 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 Cervical spine surgical hardware noted. IMPRESSION: 1. No acute cardiopulmonary findings. 2. Large hiatal hernia. Electronically signed by: Norleen Kil MD 10/17/2024 12:56 PM EDT RP Workstation: [id number]   CT CHEST ABDOMEN PELVIS W CONTRAST Result Date: 10/16/2024 CLINICAL DATA:  Sepsis. EXAM: CT CHEST, ABDOMEN, AND PELVIS WITH CONTRAST TECHNIQUE: Multidetector CT imaging of the chest, abdomen and pelvis was performed following the standard protocol during bolus administration of intravenous contrast. RADIATION DOSE REDUCTION: This exam was performed according to the departmental dose-optimization program which includes automated exposure control, adjustment of the mA and/or kV according to patient size and/or use of iterative reconstruction technique. CONTRAST:  OMNIPAQUE  IOHEXOL  300 MG/ML  SOLN COMPARISON:  Chest radiograph dated 10/16/2024 and pelvic CT dated 10/03/2024. FINDINGS: CT CHEST FINDINGS Cardiovascular: There is no cardiomegaly or pericardial effusion. There is coronary vascular calcification. Moderate calcified and noncalcified plaque of the thoracic aorta. No aneurysmal dilatation or dissection. The origins of the great vessels of the aortic arch and the central pulmonary arteries are patent. Mediastinum/Nodes: No hilar or mediastinal adenopathy. There is a large hiatal hernia containing the majority of the stomach and a segment of the transverse colon. The esophagus is grossly unremarkable. No mediastinal fluid collection. Lungs/Pleura: Background of emphysema. There is partial  compressive atelectasis of the lower lobes secondary to a large hiatal hernia. Faint cluster of nodular density in the right upper lobe likely post inflammatory or atypical infection. No consolidative changes. There is no pleural effusion pneumothorax. The central airways are patent. Musculoskeletal: No acute osseous pathology. Osteopenia with degenerative changes of the spine. Old sternal fracture. T12 vertebroplasty. CT ABDOMEN PELVIS FINDINGS No intra-abdominal free air or free fluid. Hepatobiliary: The liver is unremarkable. There is mild dilatation, post cholecystectomy. No retained calcified stone noted in the central CBD. Pancreas: Unremarkable. No pancreatic ductal dilatation or surrounding inflammatory changes. Spleen: Normal in size without focal abnormality. Adrenals/Urinary Tract: The adrenal glands unremarkable. There is no hydronephrosis on either side. There is symmetric enhancement and excretion of contrast by both kidneys. The visualized ureters appear unremarkable the urinary bladder is poorly visualized due to streak artifact caused by bilateral total hip arthroplasties. Stomach/Bowel: There is no bowel obstruction or active inflammation. The appendix is normal. Vascular/Lymphatic: Advanced aortoiliac atherosclerotic disease. The IVC is unremarkable. No portal venous gas. There is no adenopathy. Reproductive: Evaluation of the pelvic structures is very limited due to streak artifact caused by bilateral hip arthroplasties. Other: None Musculoskeletal: Bilateral total hip arthroplasties. Displaced fractures of the right anterior and inferior pubic rami as well as insufficiency fractures of bilateral sacral ala. There is osteopenia with degenerative changes of the spine. IMPRESSION: 1. A cluster of nodular density in the right upper lobe, likely post inflammatory versus atypical pneumonia. Clinical correlation is recommended. 2. Large hiatal hernia containing the majority of the stomach and a segment  of the transverse colon. No bowel obstruction. Normal appendix. 3. Displaced fractures of the right anterior and inferior pubic rami as well as insufficiency fractures of bilateral sacral ala. 4.  Aortic Atherosclerosis (ICD10-I70.0). Electronically Signed   By:  Vanetta Chou M.D.   On: 10/16/2024 19:35   CT Head Wo Contrast Result Date: 10/16/2024 EXAM: CT HEAD WITHOUT CONTRAST 10/16/2024 07:08:51 PM TECHNIQUE: CT of the head was performed without the administration of intravenous contrast. Automated exposure control, iterative reconstruction, and/or weight based adjustment of the mA/kV was utilized to reduce the radiation dose to as low as reasonably achievable. COMPARISON: 06/26/2024 CLINICAL HISTORY: Mental status change, unknown cause. Pt BIB [facility] from [facility] with reports of AMS since Saturday morning. Pt has been more confused than normal. Pt has had recent fractures, oriented to herself. Pt reports burning with urination. Right clavicle fx. FINDINGS: BRAIN AND VENTRICLES: No acute hemorrhage. No evidence of acute infarct. No hydrocephalus. No extra-axial collection. No mass effect or midline shift. Calcific atherosclerosis. ORBITS: No acute abnormality. SINUSES: Left sphenoid sinus mucosal thickening. SOFT TISSUES AND SKULL: No acute soft tissue abnormality. No skull fracture. LIMITATIONS/ARTIFACTS: Motion limited study. IMPRESSION: 1. Motion degraded exam. 2. No acute intracranial abnormality. 3. Chronic left sphenoid sinus disease. Electronically signed by: Morene Hoard MD 10/16/2024 07:31 PM EDT RP Workstation: [id number]   DG Chest Port 1 View Result Date: 10/16/2024 EXAM: 1 VIEW(S) XRAY OF THE CHEST 10/16/2024 03:29:00 PM COMPARISON: 10/03/2024 CLINICAL HISTORY: [id number] AMS (altered mental status) [id number]. Per chart: Pt BIB [facility] from [facility] with reports of AMS since Saturday morning. Pt has been more confused than normal. Pt has had recent fractures,  oriented to herself. Pt reports burning with urination. Right clavicle fx. 2L [place] at ; baseline. FINDINGS: LUNGS AND PLEURA: No focal pulmonary opacity. No pulmonary edema. No pleural effusion. No pneumothorax. HEART AND MEDIASTINUM: No acute abnormality of the cardiac and mediastinal silhouettes. Large hiatal hernia is again noted. BONES AND SOFT TISSUES: No acute osseous abnormality. IMPRESSION: 1. No acute cardiopulmonary abnormality. 2. Large hiatal hernia. Electronically signed by: Lynwood Seip MD 10/16/2024 03:51 PM EDT RP Workstation: [id number]   CT PELVIS WO CONTRAST Result Date: 10/03/2024 CLINICAL DATA:  Pain after fall. EXAM: CT PELVIS WITHOUT CONTRAST TECHNIQUE: Multidetector CT imaging of the pelvis was performed following the standard protocol without intravenous contrast. RADIATION DOSE REDUCTION: This exam was performed according to the departmental dose-optimization program which includes automated exposure control, adjustment of the mA and/or kV according to patient size and/or use of iterative reconstruction technique. COMPARISON:  Same-day radiograph of the right hip dated 10/03/2024. FINDINGS: Bones/Joint/Cartilage Acute fracture of the right inferior pubic ramus with full shaft width of displacement in the transverse plane measuring 6 mm (series 2, image 105). Acute mildly displaced fracture of the right superior pubic ramus (series 7, images 52-53). Nondisplaced fracture of the right sacral ala with fracture margins extending to the right sacroiliac joint and cortical buckling anteriorly (series 2, images 26-34). No evidence of significant diastasis. Bilateral hip arthroplasties demonstrate normal alignment. Hardware is intact. Diffuse osseous demineralization. Degenerative changes of the visualized lower lumbar spine, most pronounced at L4-L5. Ligaments Ligaments are suboptimally evaluated by CT. Muscles and Tendons Soft tissue and intramuscular edema with hemorrhage surrounding the right  superior and inferior pubic rami fractures. Soft tissue No loculated fluid collection. Aortoiliac atherosclerotic calcification. Descending and sigmoid colonic diverticulosis. IMPRESSION: 1. Acute fracture of the right inferior pubic ramus with full shaft width of displacement in the transverse plane measuring 6 mm. 2. Acute mildly displaced fracture of the right superior pubic ramus. 3. Nondisplaced fracture of the right sacral ala with fracture margins extending to the right sacroiliac joint. No evidence  of significant diastasis. 4. Bilateral hip arthroplasties demonstrate normal alignment. Hardware is intact. 5. Soft tissue and intramuscular edema with hemorrhage surrounding the right superior and inferior pubic rami fractures. Electronically Signed   By: Harrietta Sherry M.D.   On: 10/03/2024 19:14   CT SHOULDER RIGHT WO CONTRAST Result Date: 10/03/2024 CLINICAL DATA:  Right proximal humerus fracture after fall. EXAM: CT OF THE UPPER RIGHT EXTREMITY WITHOUT CONTRAST TECHNIQUE: Multidetector CT imaging of the upper right extremity was performed according to the standard protocol. RADIATION DOSE REDUCTION: This exam was performed according to the departmental dose-optimization program which includes automated exposure control, adjustment of the mA and/or kV according to patient size and/or use of iterative reconstruction technique. COMPARISON:  Radiograph dated 10/03/2024. FINDINGS: Bones/Joint/Cartilage Acute comminuted fracture of the greater tuberosity of the right proximal humerus with a minimally distracted cortical fracture fragment along the posterior margin of the humeral head. No dislocation. Mild glenohumeral joint osteoarthritis. 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 The acromioclavicular joint is anatomically aligned. Ligaments Suboptimally assessed by CT. Soft tissues/Muscles and Tendons Rotator cuff is suboptimally evaluated by CT. Soft tissue edema surrounding the  proximal humeral fracture. Other: No enlarged lymph nodes identified in the field of view. Visualized portions of the right lung are clear. IMPRESSION: 1. Acute comminuted fracture of the greater tuberosity of the right proximal humerus with a minimally distracted cortical fracture fragment along the posterior margin of the humeral head. No dislocation. 2. 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 Electronically Signed   By: Harrietta Sherry M.D.   On: 10/03/2024 19:02   DG Chest 2 View Result Date: 10/03/2024 CLINICAL DATA:  Status post fall. EXAM: CHEST - 2 VIEW COMPARISON:  None Available. FINDINGS: The heart size and mediastinal contours are within normal limits. No acute infiltrate, pleural effusion or pneumothorax is identified. There is a large hiatal hernia. Acute fracture deformity is seen involving the head and neck of the proximal right humerus. There is a chronic fracture deformity of the mid right clavicle. Postoperative changes are noted within the cervical spine. IMPRESSION: 1. Acute fracture of the proximal right humerus. 2. Large hiatal hernia. Electronically Signed   By: Suzen Dials M.D.   On: 10/03/2024 11:30   DG Humerus Right Result Date: 10/03/2024 CLINICAL DATA:  Status post fall. EXAM: RIGHT HUMERUS - 2+ VIEW COMPARISON:  None Available. FINDINGS: There is an acute fracture deformity involving the head and neck of the proximal right humerus. There is no evidence of dislocation. A chronic fracture of the mid right clavicle is noted. A chronic fracture of the right radial head is also suspected. Soft tissues are unremarkable. IMPRESSION: Acute fracture of the proximal right humerus. Electronically Signed   By: Suzen Dials M.D.   On: 10/03/2024 11:29   DG Hip Unilat W or Wo Pelvis 2-3 Views Right Result Date: 10/03/2024 CLINICAL DATA:  Status post fall. EXAM: DG HIP (WITH OR WITHOUT PELVIS) 2-3V*R* COMPARISON:  October 02, 2024 FINDINGS: Intact bilateral hip  replacements are seen. Acute fracture is suspected along the right inferior pubic ramus. This represents a new finding when compared to the prior study a right superior pubic ramus fracture cannot be excluded. There is no evidence of dislocation. IMPRESSION: 1. Acute fracture of the right inferior pubic ramus. Correlation with nonemergent pelvis CT is recommended. 2. Intact bilateral hip replacements. Electronically Signed   By: Suzen Dials M.D.   On: 10/03/2024 11:27   DG Shoulder Right Result  Date: 10/03/2024 CLINICAL DATA:  Status post fall. EXAM: RIGHT SHOULDER - 2+ VIEW COMPARISON:  August 13, 2024 FINDINGS: An acute, comminuted fracture deformity is seen involving the head and neck of the proximal right humerus. There is no evidence of dislocation. A chronic deformity of the mid right clavicle is noted. Postoperative changes are seen within the cervical spine. Soft tissues are unremarkable. IMPRESSION: Acute fracture of the proximal right humerus. Electronically Signed   By: Suzen Dials M.D.   On: 10/03/2024 11:24   CT CHEST LUNG CANCER SCREENING LOW DOSE WO CONTRAST Result Date: 10/03/2024 CLINICAL DATA:  47 pack-year smoking history/current smoker EXAM: CT CHEST WITHOUT CONTRAST LOW-DOSE FOR LUNG CANCER SCREENING TECHNIQUE: Multidetector CT imaging of the chest was performed following the standard protocol without IV contrast. RADIATION DOSE REDUCTION: This exam was performed according to the departmental dose-optimization program which includes automated exposure control, adjustment of the mA and/or kV according to patient size and/or use of iterative reconstruction technique. COMPARISON:  06/28/2024 CTA chest. Most recent lung cancer screening CT of 05/19/2021 FINDINGS: Cardiovascular: Aortic atherosclerosis. Tortuous descending thoracic aorta. Normal heart size, without pericardial effusion. Left main, lad, left circumflex coronary artery calcification. Mediastinum/Nodes: No  mediastinal or hilar adenopathy, given limitations of unenhanced CT. Large hiatal hernia, with greater than 1/2 of the stomach positioned in the lower chest. Lungs/Pleura: No pleural fluid. Mild centrilobular emphysema. Right greater than left apical pleuroparenchymal scarring. No change in pulmonary nodules of maximally 7.4 mm. Upper Abdomen: Normal imaged portions of the liver, spleen, adrenal glands, kidneys. Musculoskeletal: Osteopenia. Lower cervical spine fixation. Remote, nonunited right clavicular fracture. Old right rib fractures. Vertebral augmentation at T11 secondary to a moderate compression deformity. IMPRESSION: 1. Lung-RADS 2, benign appearance or behavior. Continue annual screening with low-dose chest CT without contrast in 12 months. 2. Large hiatal hernia. 3. Aortic Atherosclerosis (ICD10-I70.0) and Emphysema (ICD10-J43.9). Coronary artery atherosclerosis. Electronically Signed   By: Rockey Kilts M.D.   On: 10/03/2024 10:46   DG HIP UNILAT W OR W/O PELVIS 2-3 VIEWS RIGHT Result Date: 10/02/2024 AP pelvis and right hip x-rays were obtained in clinic today.  These are compared to prior x-rays.  Well-positioned left hip hemiarthroplasty, without acute injury.  Additional views of the right hip were available, and demonstrates a well-positioned right hip hemiarthroplasty.  No subsidence.  No fractures.  No acute injuries.  The hip is reduced.  No signs of excessive wear within the right acetabulum.  No subchondral cyst formation.  No subchondral sclerosis.  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    Microbiology: Results for orders placed or performed during the hospital encounter of 10/29/24  Resp panel by RT-PCR (RSV, Flu A&B, Covid) Anterior Nasal Swab     Status: None   Collection Time: 10/29/24  8:33 AM   Specimen: Anterior Nasal Swab  Result Value Ref Range Status   SARS Coronavirus 2 by RT PCR NEGATIVE NEGATIVE Final    Comment:  (NOTE) SARS-CoV-2 target nucleic acids are NOT DETECTED.  The SARS-CoV-2 RNA is generally detectable in upper respiratory specimens during the acute phase of infection. The lowest concentration of SARS-CoV-2 viral copies this assay can detect is 138 copies/mL. A negative result does not preclude SARS-Cov-2 infection and should not be used as the sole basis for treatment or other patient management decisions. A negative result may occur with  improper specimen collection/handling, submission of specimen other than nasopharyngeal swab, presence of viral mutation(s) within the areas targeted by this  assay, and inadequate number of viral copies(<138 copies/mL). A negative result must be combined with clinical observations, patient history, and epidemiological information. The expected result is Negative.  Fact Sheet for Patients:  bloggercourse.com  Fact Sheet for Healthcare Providers:  seriousbroker.it  This test is no t yet approved or cleared by the [place]  FDA and  has been authorized for detection and/or diagnosis of SARS-CoV-2 by FDA under an Emergency Use Authorization (EUA). This EUA will remain  in effect (meaning this test can be used) for the duration of the COVID-19 declaration under Section 564(b)(1) of the Act, 21 U.S.C.section 360bbb-3(b)(1), unless the authorization is terminated  or revoked sooner.       Influenza A by PCR NEGATIVE NEGATIVE Final   Influenza B by PCR NEGATIVE NEGATIVE Final    Comment: (NOTE) The Xpert Xpress SARS-CoV-2/FLU/RSV plus assay is intended as an aid in the diagnosis of influenza from Nasopharyngeal swab specimens and should not be used as a sole basis for treatment. Nasal washings and aspirates are unacceptable for Xpert Xpress SARS-CoV-2/FLU/RSV testing.  Fact Sheet for Patients: bloggercourse.com  Fact Sheet for Healthcare  Providers: seriousbroker.it  This test is not yet approved or cleared by the [place]  FDA and has been authorized for detection and/or diagnosis of SARS-CoV-2 by FDA under an Emergency Use Authorization (EUA). This EUA will remain in effect (meaning this test can be used) for the duration of the COVID-19 declaration under Section 564(b)(1) of the Act, 21 U.S.C. section 360bbb-3(b)(1), unless the authorization is terminated or revoked.     Resp Syncytial Virus by PCR NEGATIVE NEGATIVE Final    Comment: (NOTE) Fact Sheet for Patients: bloggercourse.com  Fact Sheet for Healthcare Providers: seriousbroker.it  This test is not yet approved or cleared by the [place]  FDA and has been authorized for detection and/or diagnosis of SARS-CoV-2 by FDA under an Emergency Use Authorization (EUA). This EUA will remain in effect (meaning this test can be used) for the duration of the COVID-19 declaration under Section 564(b)(1) of the Act, 21 U.S.C. section 360bbb-3(b)(1), unless the authorization is terminated or revoked.  Performed at [facility], [address], [place], [place] [address]     Labs: CBC: Recent Labs  Lab 10/29/24 0847  WBC 11.4*  HGB 13.7  HCT 42.6  MCV 101.2*  PLT 460*   Basic Metabolic Panel: Recent Labs  Lab 10/29/24 0847  NA 138  K 4.3  CL 103  CO2 24  GLUCOSE 114*  BUN 14  CREATININE 0.65  CALCIUM  9.8   Liver Function Tests: No results for input(s): AST, ALT, ALKPHOS, BILITOT, PROT, ALBUMIN in the last 168 hours. CBG: No results for input(s): GLUCAP in the last 168 hours.  Discharge time spent: greater than 30 minutes.  Signed: Owen DELENA Lore, MD [facility] 10/31/2024

## 2024-10-31 NOTE — Progress Notes (Signed)
 Mobility Specialist Progress Note:   10/31/24 1204  Mobility  Activity  (Bed Exercises)  Level of Assistance Independent  Range of Motion/Exercises Active  Activity Response Tolerated well  Mobility Referral Yes  Mobility visit 1 Mobility  Mobility Specialist Start Time (ACUTE ONLY) 1045  Mobility Specialist Stop Time (ACUTE ONLY) 1055  Mobility Specialist Time Calculation (min) (ACUTE ONLY) 10 min   Pt was received in bed and agreed to bed exercises: Seated BLE Exercises:  1) Toe Raise/ Heel Raise - 2 x 5 each leg  2) Toe Pump - 2 x 5 each leg Pt stated having pain in right leg but manageable. Returned to bed with all needs met. Call bell in reach.   [employer] - Mobility Specialist

## 2024-10-31 NOTE — Evaluation (Signed)
 Physical Therapy Evaluation Patient Details Name: Jody Wilson MRN: [id number] DOB: 03/18/1951 Today's Date: 10/31/2024  History of Present Illness  Pt is a 73 y/o F admitted on 10/29/24 after presenting for evaluation of AMS & hallucinations. Pt is being treated for acute toxic encephalopathy, polypharmacy. PMH: R humerus fx, vertebral & pubic fx (October 2025), HTN, HLD, bradycardia, iron deficiency anemia, PE, CAD, anxiety  Clinical Impression  Pt seen for PT evaluation with pt agreeable to tx. On this date, pt is able to complete bed mobility with min assist, min assist for transfers. Pt reports she has not f/u with ortho since admission on 10/03/24. Notified nurse of pt's NWB RUE in sling & reached out to ortho re: updated recommendations; ortho ordered imaging. Will continue to follow pt acutely to progress mobility as able.        If plan is discharge home, recommend the following: A little help with walking and/or transfers;A little help with bathing/dressing/bathroom;Assist for transportation;Help with stairs or ramp for entrance;Assistance with cooking/housework   Can travel by private vehicle   Yes    Equipment Recommendations    Recommendations for Other Services  Rehab consult    Functional Status Assessment Patient has had a recent decline in their functional status and demonstrates the ability to make significant improvements in function in a reasonable and predictable amount of time.     Precautions / Restrictions Precautions Precautions: Fall Required Braces or Orthoses: Sling Restrictions Weight Bearing Restrictions Per Provider Order: No RUE Weight Bearing Per Provider Order: Non weight bearing (in sling) RLE Weight Bearing Per Provider Order: Weight bearing as tolerated      Mobility  Bed Mobility Overal bed mobility: Needs Assistance Bed Mobility: Supine to Sit     Supine to sit: Min assist, HOB elevated, Used rails (exit R side of bed, holds to PT's  hand to upright trunk)          Transfers Overall transfer level: Needs assistance Equipment used: 1 person hand held assist Transfers: Sit to/from Stand, Bed to chair/wheelchair/BSC Sit to Stand: Min assist   Step pivot transfers: Min assist (wide BOS, decreased step length BLE, decreased weight shift to RLE in stance 2/2 increased pain)            Ambulation/Gait                  Stairs            Wheelchair Mobility     Tilt Bed    Modified Rankin (Stroke Patients Only)       Balance Overall balance assessment: Needs assistance Sitting-balance support: Feet supported Sitting balance-Leahy Scale: Fair     Standing balance support: No upper extremity supported, During functional activity, Single extremity supported Standing balance-Leahy Scale: Fair                               Pertinent Vitals/Pain Pain Assessment Pain Assessment: Faces Faces Pain Scale: Hurts even more Pain Location: burning pain in buttock sitting in recliner, reports she was told this is nerve pain 2/2 fxs Pain Descriptors / Indicators: Burning, Grimacing Pain Intervention(s):  (placed pillows in recliner with pt reporting improvement; notified nurse of pt requesting gabapentin)    Home Living Family/patient expects to be discharged to:: Skilled nursing facility Living Arrangements: Spouse/significant other Available Help at Discharge: Family;Available PRN/intermittently Type of Home: House Home Access: Stairs to enter Entrance Stairs-Rails: None  Entrance Stairs-Number of Steps: 1   Home Layout: One level Home Equipment: Rollator (4 wheels);Cane - single point;Hand held shower head;Adaptive equipment;Shower seat      Prior Function               Mobility Comments: Prior to fall in October pt was independent without AD, driving, caring for husband (says he's not in good health). Reports she's been in rehab since.       Extremity/Trunk  Assessment   Upper Extremity Assessment Upper Extremity Assessment: Right hand dominant RUE Deficits / Details: bruising noted to R shoulder, no sling in room & pt unsure of where it is, cuing but pt able to maintain NWB throughout mobility, limited shoulder ROM    Lower Extremity Assessment Lower Extremity Assessment:  (RLE limited 2/2 pain)       Communication   Communication Communication: No apparent difficulties    Cognition Arousal: Alert Behavior During Therapy: WFL for tasks assessed/performed   PT - Cognitive impairments: No apparent impairments                       PT - Cognition Comments: AxOx4 Following commands: Intact       Cueing Cueing Techniques: Verbal cues     General Comments General comments (skin integrity, edema, etc.): Pt with incontinet void during transfer, pt reports this is not new. Assisted pt with donning clean gown & doffing soiled socks, pt able to wash lower body.    Exercises     Assessment/Plan    PT Assessment Patient needs continued PT services  PT Problem List Decreased strength;Decreased balance;Decreased activity tolerance;Decreased mobility;Decreased knowledge of use of DME;Pain;Cardiopulmonary status limiting activity;Decreased range of motion;Decreased knowledge of precautions;Impaired sensation;Decreased safety awareness       PT Treatment Interventions DME instruction;Balance training;Neuromuscular re-education;Gait training;Stair training;Functional mobility training;Patient/family education;Therapeutic activities;Therapeutic exercise;Manual techniques    PT Goals (Current goals can be found in the Care Plan section)  Acute Rehab PT Goals Patient Stated Goal: decreased pain, get better overall PT Goal Formulation: With patient Time For Goal Achievement: 11/14/24 Potential to Achieve Goals: Good    Frequency Min 2X/week     Co-evaluation               AM-PAC PT 6 Clicks Mobility  Outcome Measure  Help needed turning from your back to your side while in a flat bed without using bedrails?: None Help needed moving from lying on your back to sitting on the side of a flat bed without using bedrails?: A Little Help needed moving to and from a bed to a chair (including a wheelchair)?: A Little Help needed standing up from a chair using your arms (e.g., wheelchair or bedside chair)?: A Little Help needed to walk in hospital room?: A Lot Help needed climbing 3-5 steps with a railing? : A Lot 6 Click Score: 17    End of Session   Activity Tolerance: Patient tolerated treatment well Patient left: in chair;with chair alarm set;with call bell/phone within reach Nurse Communication: Mobility status;Weight bearing status;Precautions;Patient requests pain meds PT Visit Diagnosis: Muscle weakness (generalized) (M62.81);Other abnormalities of gait and mobility (R26.89);Difficulty in walking, not elsewhere classified (R26.2);Unsteadiness on feet (R26.81);Pain Pain - Right/Left: Right Pain - part of body: Leg;Hip (buttock)    Time: 8796-8782 PT Time Calculation (min) (ACUTE ONLY): 14 min   Charges:   PT Evaluation $PT Eval Low Complexity: 1 Low   PT General Charges $$ ACUTE PT VISIT: 1  Visit         Richerd Pinal, PT, DPT 10/31/24, 12:44 PM   Richerd CHRISTELLA Pinal 10/31/2024, 12:43 PM

## 2024-10-31 NOTE — NC FL2 (Signed)
 [place]  MEDICAID FL2 LEVEL OF CARE FORM     IDENTIFICATION  Patient Name: Jody Wilson Birthdate: [birth year] Sex: female Admission Date (Current Location): 10/29/2024  [facility] and [place] Number:  Erik   Facility and Address:  [facility],  [address], [place] [address]      Provider Number: [id number]  Attending Physician Name and Address:  Madelyne Owen LABOR, MD  Relative Name and Phone Number:  Janaisa, [place])  [phone]    Current Level of Care: Hospital Recommended Level of Care: Skilled Nursing Facility Prior Approval Number:    Date Approved/Denied:   PASRR Number: [id number]  Discharge Plan: SNF    Current Diagnoses: Patient Active Problem List   Diagnosis Date Noted   Visual hallucination 10/30/2024   Polypharmacy 10/30/2024   AMS (altered mental status) 10/29/2024   Dehydration 10/18/2024   Community acquired pneumonia 10/16/2024   Toxic encephalopathy 10/16/2024   History of bradycardia 10/16/2024   Sepsis (HCC) 10/16/2024   Closed fracture of proximal end of right humerus 10/03/2024   Closed fracture of right inferior pubic ramus (HCC) 10/03/2024   Sinus bradycardia 10/03/2024   NSTEMI (non-ST elevated myocardial infarction) (HCC) 06/29/2024   Syncope 06/27/2024   Elevated troponin 06/27/2024   Acute respiratory failure with hypoxia (HCC) 06/27/2024   Abnormal CT of liver 06/26/2024   Incontinence of feces 05/08/2024   Closed fracture of neck of left femur (HCC) 04/13/2024   Hypokalemia 04/13/2024   Closed hip fracture requiring operative repair with routine healing 02/09/2023   Pulmonary nodule 02/09/2023   Hypertriglyceridemia 01/22/2022   Abnormal findings on diagnostic imaging of liver and biliary tract 12/09/2021   Diarrhea 12/09/2021   History of colonic polyps 12/09/2021   Anxiety 06/23/2020   Hiatal hernia 06/23/2020   Thoracic aortic atherosclerosis 06/23/2020   Smoking history 04/25/2020    Involuntary muscle jerks while sleeping 09/26/2019   B12 deficiency 09/25/2019   Iron deficiency anemia 09/25/2019   Elevated liver function tests 09/07/2019   Osteoporosis 02/09/2017   History of vertebral fracture 02/09/2017   GERD (gastroesophageal reflux disease) 03/11/2016   Hyperlipidemia 02/19/2015   Essential hypertension 03/18/2014    Orientation RESPIRATION BLADDER Height & Weight     Self, Time, Situation, Place  Normal Incontinent Weight: 55.8 kg Height:  5' 5 (165.1 cm)  BEHAVIORAL SYMPTOMS/MOOD NEUROLOGICAL BOWEL NUTRITION STATUS      Continent Diet (Regular, Thin Liquids)  AMBULATORY STATUS COMMUNICATION OF NEEDS Skin   Extensive Assist Verbally Normal                       Personal Care Assistance Level of Assistance  Bathing, Feeding Bathing Assistance: Limited assistance Feeding assistance: Independent Dressing Assistance: Limited assistance     Functional Limitations Info  Sight, Hearing, Speech Sight Info: Impaired Financial Trader) Hearing Info: Adequate Speech Info: Adequate    SPECIAL CARE FACTORS FREQUENCY  OT (By licensed OT), PT (By licensed PT)     PT Frequency: 5x/wk OT Frequency: 5x/wk            Contractures Contractures Info: Not present    Additional Factors Info  Code Status, Allergies Code Status Info: FULL Allergies Info: Oxycodone , Tetracyclines & Related           Current Medications (10/31/2024):  This is the current hospital active medication list Current Facility-Administered Medications  Medication Dose Route Frequency Provider Last Rate Last Admin   acetaminophen  (TYLENOL ) tablet 1,000 mg  1,000 mg Oral  TID Laurita Manor T, MD   1,000 mg at 10/31/24 9071   ALPRAZolam  (XANAX ) tablet 0.25 mg  0.25 mg Oral BID PRN Laurita Manor T, MD   0.25 mg at 10/31/24 1228   aspirin  EC tablet 81 mg  81 mg Oral Daily Laurita Manor T, MD   81 mg at 10/31/24 9070   buPROPion  (WELLBUTRIN  XL) 24 hr tablet 150 mg  150 mg Oral Daily Laurita Manor T, MD   150 mg at 10/31/24 9070   carvedilol  (COREG ) tablet 12.5 mg  12.5 mg Oral BID WC Laurita Manor T, MD   12.5 mg at 10/31/24 9070   citalopram  (CELEXA ) tablet 40 mg  40 mg Oral Daily Laurita Manor T, MD   40 mg at 10/31/24 9071   docusate sodium  (COLACE) capsule 100 mg  100 mg Oral BID Laurita Manor T, MD   100 mg at 10/31/24 9071   enoxaparin  (LOVENOX ) injection 40 mg  40 mg Subcutaneous Q24H Laurita Manor T, MD   40 mg at 10/30/24 1636   gabapentin (NEURONTIN) capsule 100 mg  100 mg Oral BID Regalado, Belkys A, MD   100 mg at 10/31/24 1228   ketorolac  (TORADOL ) 15 MG/ML injection 15 mg  15 mg Intravenous Q6H PRN Laurita Manor T, MD   15 mg at 10/31/24 9070   lip balm (CARMEX) ointment   Topical PRN Jadine Toribio SQUIBB, MD       OLANZapine (ZYPREXA) injection 2.5 mg  2.5 mg Intramuscular Q6H PRN Laurita Manor T, MD       ondansetron  (ZOFRAN ) tablet 4 mg  4 mg Oral Q6H PRN Laurita Manor T, MD       Or   ondansetron  (ZOFRAN ) injection 4 mg  4 mg Intravenous Q6H PRN Laurita Manor T, MD       polyethylene glycol (MIRALAX  / GLYCOLAX ) packet 17 g  17 g Oral Daily PRN Laurita Manor T, MD       pravastatin  (PRAVACHOL ) tablet 40 mg  40 mg Oral Daily Zhang, Ping T, MD   40 mg at 10/31/24 9071     Discharge Medications: Please see discharge summary for a list of discharge medications.  Relevant Imaging Results:  Relevant Lab Results:   Additional Information SSN: [id number]  Jon ONEIDA Anon, RN

## 2024-10-31 NOTE — TOC Progression Note (Signed)
 Transition of Care [facility]) - Progression Note    Patient Details  Name: Jody Wilson MRN: [id number] Date of Birth: 1951/10/15  [facility] CM/SW Contact  Jon ONEIDA Anon, RN Phone Number: 10/31/2024, 2:32 PM  Clinical Narrative:    PT recommends SNF at [place]. Referrals sent out for SNF placement. Awaiting bed offers. IP CM continuing to follow.   Expected Discharge Plan: Skilled Nursing Facility Barriers to Discharge: SNF Pending bed offer               Expected Discharge Plan and Services In-house Referral: Clinical Social Work Discharge Planning Services: NA Post Acute Care Choice: NA Living arrangements for the past 2 months: Single Family Home Expected Discharge Date: 10/31/24               DME Arranged: N/A DME Agency: NA       HH Arranged: NA HH Agency: NA         Social Drivers of Health (SDOH) Interventions SDOH Screenings   Food Insecurity: No Food Insecurity (10/29/2024)  Housing: Low Risk  (10/29/2024)  Transportation Needs: No Transportation Needs (10/29/2024)  Utilities: Not At Risk (10/29/2024)  Alcohol Screen: Low Risk  (12/27/2023)  Depression (PHQ2-9): Low Risk  (07/24/2024)  Recent Concern: Depression (PHQ2-9) - High Risk (07/06/2024)  Financial Resource Strain: Low Risk  (12/27/2023)  Physical Activity: Inactive (12/27/2023)  Social Connections: Patient Declined (10/29/2024)  Recent Concern: Social Connections - Moderately Isolated (10/16/2024)  Stress: No Stress Concern Present (12/27/2023)  Tobacco Use: Medium Risk (10/29/2024)  Health Literacy: Adequate Health Literacy (12/27/2023)    Readmission Risk Interventions    10/22/2024    2:33 PM 04/13/2024    9:01 AM  Readmission Risk Prevention Plan  Medication Screening  Complete  Transportation Screening Complete Complete  HRI or Home Care Consult Complete   Social Work Consult for Recovery Care Planning/Counseling Complete   Palliative Care Screening Not Applicable    Medication Review Oceanographer) Complete

## 2024-10-31 NOTE — TOC Progression Note (Addendum)
 Transition of Care [facility]) - Progression Note    Patient Details  Name: Jody Wilson MRN: [id number] Date of Birth: 1951/02/09  Transition of Care [facility]) CM/SW Contact  Heather DELENA Saltness, LCSW Phone Number: 10/31/2024, 12:03 PM  Clinical Narrative:     ADDENDUM  3:33 PM - CSW spoke with pt's son, Chloeanne Poteet, via phone call to discuss discharge planning and recommendation for SNF rehab. Pt's son agreeable to SNF. CSW sent referral to SNF locations. Awaiting bed offers.  Per attending provider, pt is medically stable for discharge today. PT planning to evaluate pt this afternoon. CSW spoke with pt's son, Payden Bonus [phone], via phone call to provide update. CSW will await PT's recommendation.   Expected Discharge Plan: Home/Self Care Barriers to Discharge: Continued Medical Work up  Expected Discharge Plan and Services In-house Referral: Clinical Social Work Discharge Planning Services: NA Post Acute Care Choice: NA Living arrangements for the past 2 months: Single Family Home Expected Discharge Date: 10/31/24               DME Arranged: N/A DME Agency: NA       HH Arranged: NA HH Agency: NA         Social Drivers of Health (SDOH) Interventions SDOH Screenings   Food Insecurity: No Food Insecurity (10/29/2024)  Housing: Low Risk  (10/29/2024)  Transportation Needs: No Transportation Needs (10/29/2024)  Utilities: Not At Risk (10/29/2024)  Alcohol Screen: Low Risk  (12/27/2023)  Depression (PHQ2-9): Low Risk  (07/24/2024)  Recent Concern: Depression (PHQ2-9) - High Risk (07/06/2024)  Financial Resource Strain: Low Risk  (12/27/2023)  Physical Activity: Inactive (12/27/2023)  Social Connections: Patient Declined (10/29/2024)  Recent Concern: Social Connections - Moderately Isolated (10/16/2024)  Stress: No Stress Concern Present (12/27/2023)  Tobacco Use: Medium Risk (10/29/2024)  Health Literacy: Adequate Health Literacy (12/27/2023)    Readmission Risk  Interventions    10/22/2024    2:33 PM 04/13/2024    9:01 AM  Readmission Risk Prevention Plan  Medication Screening  Complete  Transportation Screening Complete Complete  HRI or Home Care Consult Complete   Social Work Consult for Recovery Care Planning/Counseling Complete   Palliative Care Screening Not Applicable   Medication Review Oceanographer) Complete     Signed: Heather Saltness, MSW, LCSW Clinical Social Worker Inpatient Care Management 10/31/2024 12:06 PM

## 2024-11-01 DIAGNOSIS — R410 Disorientation, unspecified: Secondary | ICD-10-CM | POA: Diagnosis not present

## 2024-11-01 MED ORDER — LIDOCAINE 5 % EX PTCH
1.0000 | MEDICATED_PATCH | CUTANEOUS | Status: DC
  Administered 2024-11-01 – 2024-11-05 (×5): 1 via TRANSDERMAL
  Filled 2024-11-01 (×4): qty 1

## 2024-11-01 MED ORDER — LACTATED RINGERS IV SOLN: INTRAVENOUS | Status: AC

## 2024-11-01 MED ORDER — MUSCLE RUB 10-15 % EX CREA
TOPICAL_CREAM | CUTANEOUS | Status: DC | PRN
  Filled 2024-11-01 (×2): qty 85

## 2024-11-01 MED ORDER — VITAMIN B-12 1000 MCG PO TABS
500.0000 ug | ORAL_TABLET | Freq: Every day | ORAL | Status: DC
  Administered 2024-11-01 – 2024-11-05 (×5): 500 ug via ORAL
  Filled 2024-11-01 (×5): qty 1

## 2024-11-01 NOTE — Progress Notes (Signed)
 Orthopedic Tech Progress Note Patient Details:  Jody Wilson [birth year] [id number]  Ortho Devices Type of Ortho Device: Shoulder immobilizer Ortho Device/Splint Location: right Ortho Device/Splint Interventions: Ordered, Application, Adjustment   Post Interventions Patient Tolerated: Well Instructions Provided: Adjustment of device, Care of device  [name] 11/01/2024, 1:24 PM

## 2024-11-01 NOTE — Plan of Care (Signed)

## 2024-11-01 NOTE — Progress Notes (Addendum)
 PROGRESS NOTE    Jody Wilson  FMW:[id number] DOB: Oct 01, 1951 DOA: 10/29/2024 PCP: Dettinger, Fonda LABOR, MD   Brief Narrative: 73 year old woman with a recent closed fracture right humerus, vertebral and pubic fractures in October, sent from nursing home for evaluation of altered mental status with hallucinations. This was also seen on previous admission was thought to be secondary to polypharmacy, particularly oxycodone . Oxycodone  was stopped on that hospitalization, patient's mentation improved and was discharged. However at the nursing home she was started on oxycodone  for uncontrolled pain, started to hallucinate and was sent to the emergency department. Admitted for acute toxic encephalopathy.   Stable for discharge, awaiting placement.   Assessment & Plan:   Principal Problem:   Toxic encephalopathy Active Problems:   Anxiety   Closed fracture of right inferior pubic ramus (HCC)   Visual hallucination   Polypharmacy  1-Acute toxic encephalopathy Visual  hallucinations Polypharmacy -Patient presents with hallucination and confusion, in the setting of polypharmacy: Oxycodone  and tramadol  and Flexeril  discontinued. Is back to baseline. -She still having neuropathic pain, - resume gabapentin at any signs of confusion or hallucination will need to stop gabapentin.  -stable.   Subacute fracture of right L4, bilateral L5 transverse process Acute bilateral sacral fractures History Right humerus fracture. Repeated X ray which was evaluated by ortho Dr Beverley, who recommend continue NWB shoulder and use of Sling.  - Continue follow-up with orthopedic as an outpatient - Tylenol  for pain   Anxiety depression Continue Celexa  and Wellbutrin  On Xanax  as needed, will recommend to wean this medication over time      Estimated body mass index is 20.47 kg/m as calculated from the following:   Height as of this encounter: 5' 5 (1.651 m).   Weight as of this encounter: 55.8  kg.   DVT prophylaxis:  Code Status:  Family Communication: Disposition Plan:  Status is: Observation The patient remains OBS appropriate and will d/c before 2 midnights.    Consultants:  none  Antimicrobials:    Subjective: She is still having chronic numbness tingling in her buttock and legs since after fall. Feels gabapentin helps some.  She has to get out of bed to be able to urinate.   Objective: Vitals:   10/31/24 2120 10/31/24 2208 11/01/24 0508 11/01/24 1221  BP: (!) 96/45 113/63 (!) 117/54 (!) 111/55  Pulse: 69 68 66 70  Resp: 18  18   Temp: 98.2 F (36.8 C)  98.2 F (36.8 C)   TempSrc: Oral     SpO2: 94%  97%   Weight:      Height:       No intake or output data in the 24 hours ending 11/01/24 1404 Filed Weights   10/29/24 1515  Weight: 55.8 kg    Examination:  General exam: Appears calm and comfortable  Respiratory system: Clear to auscultation. Respiratory effort normal. Cardiovascular system: S1 & S2 heard, RRR. No JVD, murmurs, rubs, gallops or clicks. No pedal edema. Gastrointestinal system: Abdomen is nondistended, soft and nontender. No organomegaly or masses felt. Normal bowel sounds heard. Central nervous system: Alert and oriented Extremities: Symmetric 5 x 5 power.   Data Reviewed: I have personally reviewed following labs and imaging studies  CBC: Recent Labs  Lab 10/29/24 0847  WBC 11.4*  HGB 13.7  HCT 42.6  MCV 101.2*  PLT 460*   Basic Metabolic Panel: Recent Labs  Lab 10/29/24 0847  NA 138  K 4.3  CL 103  CO2 24  GLUCOSE 114*  BUN 14  CREATININE 0.65  CALCIUM  9.8   GFR: Estimated Creatinine Clearance: 56 mL/min (by C-G formula based on SCr of 0.65 mg/dL). Liver Function Tests: No results for input(s): AST, ALT, ALKPHOS, BILITOT, PROT, ALBUMIN in the last 168 hours. No results for input(s): LIPASE, AMYLASE in the last 168 hours. Recent Labs  Lab 10/29/24 1148  AMMONIA 25   Coagulation  Profile: No results for input(s): INR, PROTIME in the last 168 hours. Cardiac Enzymes: No results for input(s): CKTOTAL, CKMB, CKMBINDEX, TROPONINI in the last 168 hours. BNP (last 3 results) No results for input(s): PROBNP in the last 8760 hours. HbA1C: No results for input(s): HGBA1C in the last 72 hours. CBG: No results for input(s): GLUCAP in the last 168 hours. Lipid Profile: No results for input(s): CHOL, HDL, LDLCALC, TRIG, CHOLHDL, LDLDIRECT in the last 72 hours. Thyroid  Function Tests: No results for input(s): TSH, T4TOTAL, FREET4, T3FREE, THYROIDAB in the last 72 hours. Anemia Panel: No results for input(s): VITAMINB12, FOLATE, FERRITIN, TIBC, IRON, RETICCTPCT in the last 72 hours. Sepsis Labs: No results for input(s): PROCALCITON, LATICACIDVEN in the last 168 hours.  Recent Results (from the past 240 hours)  Resp panel by RT-PCR (RSV, Flu A&B, Covid) Anterior Nasal Swab     Status: None   Collection Time: 10/29/24  8:33 AM   Specimen: Anterior Nasal Swab  Result Value Ref Range Status   SARS Coronavirus 2 by RT PCR NEGATIVE NEGATIVE Final    Comment: (NOTE) SARS-CoV-2 target nucleic acids are NOT DETECTED.  The SARS-CoV-2 RNA is generally detectable in upper respiratory specimens during the acute phase of infection. The lowest concentration of SARS-CoV-2 viral copies this assay can detect is 138 copies/mL. A negative result does not preclude SARS-Cov-2 infection and should not be used as the sole basis for treatment or other patient management decisions. A negative result may occur with  improper specimen collection/handling, submission of specimen other than nasopharyngeal swab, presence of viral mutation(s) within the areas targeted by this assay, and inadequate number of viral copies(<138 copies/mL). A negative result must be combined with clinical observations, patient history, and  epidemiological information. The expected result is Negative.  Fact Sheet for Patients:  bloggercourse.com  Fact Sheet for Healthcare Providers:  seriousbroker.it  This test is no t yet approved or cleared by the [place]  FDA and  has been authorized for detection and/or diagnosis of SARS-CoV-2 by FDA under an Emergency Use Authorization (EUA). This EUA will remain  in effect (meaning this test can be used) for the duration of the COVID-19 declaration under Section 564(b)(1) of the Act, 21 U.S.C.section 360bbb-3(b)(1), unless the authorization is terminated  or revoked sooner.       Influenza A by PCR NEGATIVE NEGATIVE Final   Influenza B by PCR NEGATIVE NEGATIVE Final    Comment: (NOTE) The Xpert Xpress SARS-CoV-2/FLU/RSV plus assay is intended as an aid in the diagnosis of influenza from Nasopharyngeal swab specimens and should not be used as a sole basis for treatment. Nasal washings and aspirates are unacceptable for Xpert Xpress SARS-CoV-2/FLU/RSV testing.  Fact Sheet for Patients: bloggercourse.com  Fact Sheet for Healthcare Providers: seriousbroker.it  This test is not yet approved or cleared by the [place]  FDA and has been authorized for detection and/or diagnosis of SARS-CoV-2 by FDA under an Emergency Use Authorization (EUA). This EUA will remain in effect (meaning this test can be used) for the duration of the COVID-19 declaration under Section 564(b)(1) of  the Act, 21 U.S.C. section 360bbb-3(b)(1), unless the authorization is terminated or revoked.     Resp Syncytial Virus by PCR NEGATIVE NEGATIVE Final    Comment: (NOTE) Fact Sheet for Patients: bloggercourse.com  Fact Sheet for Healthcare Providers: seriousbroker.it  This test is not yet approved or cleared by the [place]  FDA and has been  authorized for detection and/or diagnosis of SARS-CoV-2 by FDA under an Emergency Use Authorization (EUA). This EUA will remain in effect (meaning this test can be used) for the duration of the COVID-19 declaration under Section 564(b)(1) of the Act, 21 U.S.C. section 360bbb-3(b)(1), unless the authorization is terminated or revoked.  Performed at [facility], [address], [place], [place] [address]          Radiology Studies: DG Humerus Right Result Date: 10/31/2024 EXAM: VIEW(S) XRAY OF THE RIGHT HUMERUS 10/31/2024 01:00:00 PM COMPARISON: 10/03/2024 CLINICAL HISTORY: 809851 Humerus fracture 809851 Humerus fracture 809851 FINDINGS: BONES AND JOINTS: Continue presence of mildly displaced fracture involving proximal right humeral head and neck. Some callus formation is noted. Old distal right clavicular fracture is again noted. No focal osseous lesion. No joint dislocation. SOFT TISSUES: The soft tissues are unremarkable. IMPRESSION: 1. Mildly displaced fracture of the proximal right humeral head and neck with callus formation, compatible with healing phase. 2. Remote distal right clavicular fracture. Electronically signed by: Lynwood Seip MD 10/31/2024 03:11 PM EST RP Workstation: [id number]        Scheduled Meds:  acetaminophen   1,000 mg Oral TID   aspirin  EC  81 mg Oral Daily   buPROPion   150 mg Oral Daily   carvedilol   12.5 mg Oral BID WC   citalopram   40 mg Oral Daily   vitamin B-12  500 mcg Oral Daily   docusate sodium   100 mg Oral BID   enoxaparin  (LOVENOX ) injection  40 mg Subcutaneous Q24H   feeding supplement  237 mL Oral BID BM   gabapentin  100 mg Oral BID   lidocaine   1 patch Transdermal Q24H   pravastatin   40 mg Oral Daily   Continuous Infusions:  lactated ringers  100 mL/hr at 11/01/24 1212     LOS: 0 days    Time spent: 35  minutes    Jody Manternach A Nimra Puccinelli, MD [facility]   If 7PM-7AM, please contact  night-coverage [email or web address]  11/01/2024, 2:04 PM

## 2024-11-01 NOTE — TOC Progression Note (Addendum)
 Transition of Care [facility]) - Progression Note    Patient Details  Name: Jody Wilson MRN: [id number] Date of Birth: 1951-05-30  Transition of Care [facility]) CM/SW Contact  Heather DELENA Saltness, LCSW Phone Number: 11/01/2024, 1:42 PM  Clinical Narrative:     ADDENDUM  1:50 PM - CSW received return phone call from [facility] admissions director, Devere [phone], via phone call. Per Lauraine, they have open beds for SNF rehab. Referral packet faxed to [phone]. TOC will await follow up. Pt's son, Stassi Fadely, made aware.  CSW received secure chat from OT, stating that pt would prefer to go to SNF rehab at [facility] in [place]. CSW attempted to speak with admissions director, Devere Barrack [phone], [phone]. [phone], via phone call. No answer, voicemail left.    Expected Discharge Plan: Skilled Nursing Facility Barriers to Discharge: SNF Pending bed offer  Expected Discharge Plan and Services In-house Referral: Clinical Social Work Discharge Planning Services: NA Post Acute Care Choice: NA Living arrangements for the past 2 months: Single Family Home Expected Discharge Date: 10/31/24               DME Arranged: N/A DME Agency: NA       HH Arranged: NA HH Agency: NA         Social Drivers of Health (SDOH) Interventions SDOH Screenings   Food Insecurity: No Food Insecurity (10/29/2024)  Housing: Low Risk  (10/29/2024)  Transportation Needs: No Transportation Needs (10/29/2024)  Utilities: Not At Risk (10/29/2024)  Alcohol Screen: Low Risk  (12/27/2023)  Depression (PHQ2-9): Low Risk  (07/24/2024)  Recent Concern: Depression (PHQ2-9) - High Risk (07/06/2024)  Financial Resource Strain: Low Risk  (12/27/2023)  Physical Activity: Inactive (12/27/2023)  Social Connections: Patient Declined (10/29/2024)  Recent Concern: Social Connections - Moderately Isolated (10/16/2024)  Stress: No Stress Concern Present (12/27/2023)  Tobacco Use: Medium Risk (10/29/2024)  Health  Literacy: Adequate Health Literacy (12/27/2023)    Readmission Risk Interventions    10/22/2024    2:33 PM 04/13/2024    9:01 AM  Readmission Risk Prevention Plan  Medication Screening  Complete  Transportation Screening Complete Complete  HRI or Home Care Consult Complete   Social Work Consult for Recovery Care Planning/Counseling Complete   Palliative Care Screening Not Applicable   Medication Review Oceanographer) Complete     Signed: Heather Saltness, MSW, LCSW Clinical Social Worker Inpatient Care Management 11/01/2024 1:46 PM

## 2024-11-01 NOTE — Evaluation (Signed)
 Occupational Therapy Evaluation Patient Details Name: Jody Wilson MRN: [id number] DOB: Sep 05, 1951 Today's Date: 11/01/2024   History of Present Illness   Pt is a 73 y/o F admitted on 10/29/24 after presenting for evaluation of AMS & hallucinations. Pt is being treated for acute toxic encephalopathy, polypharmacy. PMH: R humerus fx, vertebral & pubic fx (October 2025), HTN, HLD, bradycardia, iron deficiency anemia, PE, CAD, anxiety     Clinical Impressions Pt is typically independent and drives. She lives with her husband with sons nearby. Pt currently needs moderate assistance to come to EOB, CGA to stand from elevated bed and min assist to transfer toward her L side. She requires set up to max assist for ADLs. Pt experiencing increased pain in her buttocks once sitting in chair on pillows she describes as sciatica. RN notified of need for pain medication. Pt without R UE sling, awaiting evaluation of imaging of R shoulder completed 10/31/24 by Dr Beverley. Remains NWB on R UE. Patient will benefit from continued inpatient follow up therapy, <3 hours/day.     If plan is discharge home, recommend the following:   A little help with walking and/or transfers;A lot of help with bathing/dressing/bathroom;Assistance with cooking/housework;Assistance with feeding;Assist for transportation;Help with stairs or ramp for entrance     Functional Status Assessment   Patient has had a recent decline in their functional status and demonstrates the ability to make significant improvements in function in a reasonable and predictable amount of time.     Equipment Recommendations   BSC/3in1;Tub/shower seat;Wheelchair (measurements OT);Wheelchair cushion (measurements OT)     Recommendations for Other Services         Precautions/Restrictions   Precautions Precautions: Fall Recall of Precautions/Restrictions: Intact Required Braces or Orthoses: Sling Restrictions Weight Bearing  Restrictions Per Provider Order: Yes RUE Weight Bearing Per Provider Order: Non weight bearing RLE Weight Bearing Per Provider Order: Weight bearing as tolerated Other Position/Activity Restrictions: R UE sling has been misplaced     Mobility Bed Mobility Overal bed mobility: Needs Assistance Bed Mobility: Supine to Sit     Supine to sit: Min assist, HOB elevated Sit to supine: Mod assist   General bed mobility comments: assisted for hips toward EOB with bed pad and to raise trunk    Transfers Overall transfer level: Needs assistance Equipment used: Rolling walker (2 wheels) Transfers: Sit to/from Stand, Bed to chair/wheelchair/BSC Sit to Stand: Contact guard assist, From elevated surface     Step pivot transfers: Min assist     General transfer comment: used L hand on RW with OT guiding walker as pt took slow, pivotal steps toward L to chair      Balance Overall balance assessment: Needs assistance   Sitting balance-Leahy Scale: Fair     Standing balance support: No upper extremity supported, During functional activity, Single extremity supported Standing balance-Leahy Scale: Fair Standing balance comment: can release walker in static standing, needs min assist for transfers                           ADL either performed or assessed with clinical judgement   ADL Overall ADL's : Needs assistance/impaired Eating/Feeding: Set up;Sitting   Grooming: Brushing hair;Sitting;Set up   Upper Body Bathing: Moderate assistance;Sitting   Lower Body Bathing: Maximal assistance;Sit to/from stand   Upper Body Dressing : Moderate assistance;Sitting   Lower Body Dressing: Maximal assistance;Sit to/from stand   Toilet Transfer: Minimal assistance;Stand-pivot;Rolling walker (2 wheels)  Toileting- Clothing Manipulation and Hygiene: Maximal assistance;Sit to/from stand               Vision Baseline Vision/History: 1 Wears glasses Ability to See in Adequate  Light: 0 Adequate Patient Visual Report: No change from baseline       Perception         Praxis         Pertinent Vitals/Pain Pain Assessment Pain Assessment: Faces Faces Pain Scale: Hurts even more Pain Location: buttocks Pain Descriptors / Indicators: Burning, Grimacing Pain Intervention(s): Monitored during session, Repositioned, Patient requesting pain meds-RN notified     Extremity/Trunk Assessment Upper Extremity Assessment Upper Extremity Assessment: Right hand dominant;RUE deficits/detail RUE Deficits / Details: bruising noted to R shoulder, no sling in room & pt unsure of where it is, pt able to maintain NWB throughout mobility, full AROM elbow to hand RUE: Unable to fully assess due to immobilization RUE Coordination: decreased gross motor   Lower Extremity Assessment Lower Extremity Assessment: Defer to PT evaluation   Cervical / Trunk Assessment Cervical / Trunk Assessment: Normal   Communication Communication Communication: No apparent difficulties   Cognition Arousal: Alert Behavior During Therapy: WFL for tasks assessed/performed Cognition: No apparent impairments                               Following commands: Intact       Cueing  General Comments   Cueing Techniques: Verbal cues      Exercises     Shoulder Instructions      Home Living Family/patient expects to be discharged to:: Skilled nursing facility Living Arrangements: Spouse/significant other Available Help at Discharge: Family;Available PRN/intermittently Type of Home: House Home Access: Stairs to enter Entrance Stairs-Number of Steps: 1 Entrance Stairs-Rails: None Home Layout: One level     Bathroom Shower/Tub: Chief Strategy Officer: Standard     Home Equipment: Rollator (4 wheels);Cane - single point;Hand held shower head;Adaptive equipment;Shower seat Adaptive Equipment: Reacher;Sock aid        Prior Functioning/Environment Prior  Level of Function : Independent/Modified Independent;Driving             Mobility Comments: Prior to fall in October pt was independent without AD, driving, caring for husband (says he's not in good health). Reports she's been in rehab since. ADLs Comments: Independent with self-care, cooking, housekeeping prior to fall.    OT Problem List: Decreased strength;Decreased activity tolerance;Impaired balance (sitting and/or standing)   OT Treatment/Interventions: Self-care/ADL training;Therapeutic exercise;Energy conservation;Therapeutic activities;Patient/family education      OT Goals(Current goals can be found in the care plan section)   Acute Rehab OT Goals OT Goal Formulation: With patient Time For Goal Achievement: 11/15/24 Potential to Achieve Goals: Good ADL Goals Pt Will Perform Grooming: with contact guard assist;standing Pt Will Perform Upper Body Dressing: with supervision;sitting Pt Will Transfer to Toilet: with contact guard assist;ambulating;bedside commode Pt Will Perform Toileting - Clothing Manipulation and hygiene: with contact guard assist;sit to/from stand Additional ADL Goal #1: Pt will complete bed mobility mod I in preparation for ADLs using features of hospital bed.   OT Frequency:  Min 2X/week    Co-evaluation              AM-PAC OT 6 Clicks Daily Activity     Outcome Measure Help from another person eating meals?: A Little Help from another person taking care of personal grooming?: A Little Help from  another person toileting, which includes using toliet, bedpan, or urinal?: A Lot Help from another person bathing (including washing, rinsing, drying)?: A Lot Help from another person to put on and taking off regular upper body clothing?: A Lot Help from another person to put on and taking off regular lower body clothing?: A Lot 6 Click Score: 14   End of Session Equipment Utilized During Treatment: Rolling walker (2 wheels);Gait belt Nurse  Communication: Mobility status;Patient requests pain meds  Activity Tolerance: Patient tolerated treatment well Patient left: in chair;with call bell/phone within reach;with nursing/sitter in room  OT Visit Diagnosis: Unsteadiness on feet (R26.81);Muscle weakness (generalized) (M62.81);History of falling (Z91.81);Pain                Time: 9054-8977 OT Time Calculation (min): 37 min Charges:  OT General Charges $OT Visit: 1 Visit OT Evaluation $OT Eval Moderate Complexity: 1 Mod OT Treatments $Therapeutic Activity: 8-22 mins  Mliss HERO, OTR/L Acute Rehabilitation Services Office: [phone]  Kennth Mliss Helling 11/01/2024, 11:20 AM

## 2024-11-01 NOTE — Plan of Care (Signed)

## 2024-11-02 DIAGNOSIS — R410 Disorientation, unspecified: Secondary | ICD-10-CM | POA: Diagnosis not present

## 2024-11-02 LAB — URINALYSIS, ROUTINE W REFLEX MICROSCOPIC
Bacteria, UA: NONE SEEN
Bilirubin Urine: NEGATIVE
Glucose, UA: NEGATIVE mg/dL
Ketones, ur: NEGATIVE mg/dL
Leukocytes,Ua: NEGATIVE
Nitrite: NEGATIVE
Protein, ur: NEGATIVE mg/dL
RBC / HPF: >50 RBC/hpf (ref 0–5)
Specific Gravity, Urine: 1.018 (ref 1.005–1.030)
pH: 6 (ref 5.0–8.0)

## 2024-11-02 LAB — BASIC METABOLIC PANEL WITH GFR
Anion gap: 8 (ref 5–15)
BUN: 22 mg/dL (ref 8–23)
CO2: 26 mmol/L (ref 22–32)
Calcium: 9.4 mg/dL (ref 8.9–10.3)
Chloride: 105 mmol/L (ref 98–111)
Creatinine, Ser: 0.67 mg/dL (ref 0.44–1.00)
GFR, Estimated: >60 mL/min (ref >60)
Glucose, Bld: 97 mg/dL (ref 70–99)
Potassium: 4.1 mmol/L (ref 3.5–5.1)
Sodium: 139 mmol/L (ref 135–145)

## 2024-11-02 MED ORDER — PANTOPRAZOLE SODIUM 40 MG PO TBEC
40.0000 mg | DELAYED_RELEASE_TABLET | Freq: Every day | ORAL | Status: DC
  Administered 2024-11-02 – 2024-11-05 (×4): 40 mg via ORAL
  Filled 2024-11-02 (×4): qty 1

## 2024-11-02 MED ORDER — LACTATED RINGERS IV SOLN: INTRAVENOUS | Status: AC

## 2024-11-02 NOTE — TOC Progression Note (Addendum)
 Transition of Care [facility]) - Progression Note    Patient Details  Name: Jody Wilson MRN: [id number] Date of Birth: 10/22/1951  Transition of Care [facility] [address]) CM/SW Contact  Sonda Manuella Quill, RN Phone Number: 11/02/2024, 9:36 AM  Clinical Narrative:    LVM for Devere Barrack, Admissions Director at [facility] for update on bed status; LVM at [phone], ext [phone]; awaiting return call.  [phone]- return call from Devere; she said records are under review; she will call back w/ update.  -[phone]-  return call from Devere Barrack at [facility]; she said facility is unable to offer bed; will notify pt.  -[phone]- pt and son Amaris Delafuente [phone]) notified [facility] unable to offer bed; list of bed offers given: [facility], [facility], and [facility]; awaiting choice; ins auth needed.   Expected Discharge Plan: Skilled Nursing Facility Barriers to Discharge: SNF Pending bed offer               Expected Discharge Plan and Services In-house Referral: Clinical Social Work Discharge Planning Services: NA Post Acute Care Choice: NA Living arrangements for the past 2 months: Single Family Home Expected Discharge Date: 10/31/24               DME Arranged: N/A DME Agency: NA       HH Arranged: NA HH Agency: NA         Social Drivers of Health (SDOH) Interventions SDOH Screenings   Food Insecurity: No Food Insecurity (10/29/2024)  Housing: Low Risk  (10/29/2024)  Transportation Needs: No Transportation Needs (10/29/2024)  Utilities: Not At Risk (10/29/2024)  Alcohol Screen: Low Risk  (12/27/2023)  Depression (PHQ2-9): Low Risk  (07/24/2024)  Recent Concern: Depression (PHQ2-9) - High Risk (07/06/2024)  Financial Resource Strain: Low Risk  (12/27/2023)  Physical Activity: Inactive (12/27/2023)  Social Connections: Patient Declined (10/29/2024)  Recent Concern: Social Connections - Moderately Isolated (10/16/2024)  Stress: No Stress Concern Present (12/27/2023)  Tobacco Use:  Medium Risk (10/29/2024)  Health Literacy: Adequate Health Literacy (12/27/2023)    Readmission Risk Interventions    10/22/2024    2:33 PM 04/13/2024    9:01 AM  Readmission Risk Prevention Plan  Medication Screening  Complete  Transportation Screening Complete Complete  HRI or Home Care Consult Complete   Social Work Consult for Recovery Care Planning/Counseling Complete   Palliative Care Screening Not Applicable   Medication Review Ocean[employer]) Complete

## 2024-11-02 NOTE — Progress Notes (Signed)
 PROGRESS NOTE    Jody Wilson  FMW:[id number] DOB: 12-Apr-1951 DOA: 10/29/2024 PCP: Dettinger, Fonda LABOR, MD   Brief Narrative: 73 year old woman with a recent closed fracture right humerus, vertebral and pubic fractures in October, sent from nursing home for evaluation of altered mental status with hallucinations. This was also seen on previous admission was thought to be secondary to polypharmacy, particularly oxycodone . Oxycodone  was stopped on that hospitalization, patient's mentation improved and was discharged. However at the nursing home she was started on oxycodone  for uncontrolled pain, started to hallucinate and was sent to the emergency department. Admitted for acute toxic encephalopathy.   Stable for discharge, awaiting placement.   Assessment & Plan:   Principal Problem:   Toxic encephalopathy Active Problems:   Anxiety   Closed fracture of right inferior pubic ramus (HCC)   Visual hallucination   Polypharmacy  1-Acute toxic encephalopathy Visual  hallucinations Polypharmacy -Patient presents with hallucination and confusion, in the setting of polypharmacy: Oxycodone  and tramadol  and Flexeril  discontinued. Is back to baseline. -She still having neuropathic pain, - resume gabapentin at any signs of confusion or hallucination will need to stop gabapentin.  -stable.   Subacute fracture of right L4, bilateral L5 transverse process Acute bilateral sacral fractures History Right humerus fracture. Repeated X ray which was evaluated by ortho Dr Beverley, who recommend continue NWB shoulder and use of Sling.  - Continue follow-up with orthopedic as an outpatient - Tylenol  for pain   Anxiety depression Continue Celexa  and Wellbutrin  On Xanax  as needed, will recommend to wean this medication over time   Microscopic hematuria.  Needs follow up out patient.  Repeat UA out patient.    Estimated body mass index is 20.47 kg/m as calculated from the following:   Height as  of this encounter: 5' 5 (1.651 m).   Weight as of this encounter: 55.8 kg.   DVT prophylaxis: SCD Code Status: Full code Family Communication: Disposition Plan:  Status is: Observation The patient remains OBS appropriate and will d/c before 2 midnights.    Consultants:  none  Antimicrobials:    Subjective: She has been urinating. No gross hematuria per nurse.  Had BM  Objective: Vitals:   11/01/24 0508 11/01/24 1221 11/01/24 1915 11/02/24 0436  BP: (!) 117/54 (!) 111/55 136/89 (!) 160/77  Pulse: 66 70 75 69  Resp: 18  15 15   Temp: 98.2 F (36.8 C) 98.2 F (36.8 C) 98.2 F (36.8 C) 98.2 F (36.8 C)  TempSrc:  Oral Oral Oral  SpO2: 97% 99% 98% 99%  Weight:      Height:        Intake/Output Summary (Last 24 hours) at 11/02/2024 1358 Last data filed at 11/02/2024 0745 Gross per 24 hour  Intake 1090 ml  Output --  Net 1090 ml   Filed Weights   10/29/24 1515  Weight: 55.8 kg    Examination:  General exam: NAD Respiratory system: CTA Cardiovascular system: S 1, S 2 RRR Gastrointestinal system: BS present, soft, nt Central nervous system: alert, oriented  Extremities: Symmetric 5 x 5 power.   Data Reviewed: I have personally reviewed following labs and imaging studies  CBC: Recent Labs  Lab 10/29/24 0847  WBC 11.4*  HGB 13.7  HCT 42.6  MCV 101.2*  PLT 460*   Basic Metabolic Panel: Recent Labs  Lab 10/29/24 0847 11/02/24 0941  NA 138 139  K 4.3 4.1  CL 103 105  CO2 24 26  GLUCOSE 114* 97  BUN  14 22  CREATININE 0.65 0.67  CALCIUM  9.8 9.4   GFR: Estimated Creatinine Clearance: 56 mL/min (by C-G formula based on SCr of 0.67 mg/dL). Liver Function Tests: No results for input(s): AST, ALT, ALKPHOS, BILITOT, PROT, ALBUMIN in the last 168 hours. No results for input(s): LIPASE, AMYLASE in the last 168 hours. Recent Labs  Lab 10/29/24 1148  AMMONIA 25   Coagulation Profile: No results for input(s): INR, PROTIME in  the last 168 hours. Cardiac Enzymes: No results for input(s): CKTOTAL, CKMB, CKMBINDEX, TROPONINI in the last 168 hours. BNP (last 3 results) No results for input(s): PROBNP in the last 8760 hours. HbA1C: No results for input(s): HGBA1C in the last 72 hours. CBG: No results for input(s): GLUCAP in the last 168 hours. Lipid Profile: No results for input(s): CHOL, HDL, LDLCALC, TRIG, CHOLHDL, LDLDIRECT in the last 72 hours. Thyroid  Function Tests: No results for input(s): TSH, T4TOTAL, FREET4, T3FREE, THYROIDAB in the last 72 hours. Anemia Panel: No results for input(s): VITAMINB12, FOLATE, FERRITIN, TIBC, IRON, RETICCTPCT in the last 72 hours. Sepsis Labs: No results for input(s): PROCALCITON, LATICACIDVEN in the last 168 hours.  Recent Results (from the past 240 hours)  Resp panel by RT-PCR (RSV, Flu A&B, Covid) Anterior Nasal Swab     Status: None   Collection Time: 10/29/24  8:33 AM   Specimen: Anterior Nasal Swab  Result Value Ref Range Status   SARS Coronavirus 2 by RT PCR NEGATIVE NEGATIVE Final    Comment: (NOTE) SARS-CoV-2 target nucleic acids are NOT DETECTED.  The SARS-CoV-2 RNA is generally detectable in upper respiratory specimens during the acute phase of infection. The lowest concentration of SARS-CoV-2 viral copies this assay can detect is 138 copies/mL. A negative result does not preclude SARS-Cov-2 infection and should not be used as the sole basis for treatment or other patient management decisions. A negative result may occur with  improper specimen collection/handling, submission of specimen other than nasopharyngeal swab, presence of viral mutation(s) within the areas targeted by this assay, and inadequate number of viral copies(<138 copies/mL). A negative result must be combined with clinical observations, patient history, and epidemiological information. The expected result is Negative.  Fact Sheet for  Patients:  bloggercourse.com  Fact Sheet for Healthcare Providers:  seriousbroker.it  This test is no t yet approved or cleared by the [place]  FDA and  has been authorized for detection and/or diagnosis of SARS-CoV-2 by FDA under an Emergency Use Authorization (EUA). This EUA will remain  in effect (meaning this test can be used) for the duration of the COVID-19 declaration under Section 564(b)(1) of the Act, 21 U.S.C.section 360bbb-3(b)(1), unless the authorization is terminated  or revoked sooner.       Influenza A by PCR NEGATIVE NEGATIVE Final   Influenza B by PCR NEGATIVE NEGATIVE Final    Comment: (NOTE) The Xpert Xpress SARS-CoV-2/FLU/RSV plus assay is intended as an aid in the diagnosis of influenza from Nasopharyngeal swab specimens and should not be used as a sole basis for treatment. Nasal washings and aspirates are unacceptable for Xpert Xpress SARS-CoV-2/FLU/RSV testing.  Fact Sheet for Patients: bloggercourse.com  Fact Sheet for Healthcare Providers: seriousbroker.it  This test is not yet approved or cleared by the [place]  FDA and has been authorized for detection and/or diagnosis of SARS-CoV-2 by FDA under an Emergency Use Authorization (EUA). This EUA will remain in effect (meaning this test can be used) for the duration of the COVID-19 declaration under Section 564(b)(1) of the  Act, 21 U.S.C. section 360bbb-3(b)(1), unless the authorization is terminated or revoked.     Resp Syncytial Virus by PCR NEGATIVE NEGATIVE Final    Comment: (NOTE) Fact Sheet for Patients: bloggercourse.com  Fact Sheet for Healthcare Providers: seriousbroker.it  This test is not yet approved or cleared by the [place]  FDA and has been authorized for detection and/or diagnosis of SARS-CoV-2 by FDA under an Emergency  Use Authorization (EUA). This EUA will remain in effect (meaning this test can be used) for the duration of the COVID-19 declaration under Section 564(b)(1) of the Act, 21 U.S.C. section 360bbb-3(b)(1), unless the authorization is terminated or revoked.  Performed at [facility], [address] [place], [place] [address]          Radiology Studies: No results found.       Scheduled Meds:  acetaminophen   1,000 mg Oral TID   aspirin  EC  81 mg Oral Daily   buPROPion   150 mg Oral Daily   carvedilol   12.5 mg Oral BID WC   citalopram   40 mg Oral Daily   vitamin B-12  500 mcg Oral Daily   docusate sodium   100 mg Oral BID   enoxaparin  (LOVENOX ) injection  40 mg Subcutaneous Q24H   feeding supplement  237 mL Oral BID BM   gabapentin  100 mg Oral BID   lidocaine   1 patch Transdermal Q24H   pravastatin   40 mg Oral Daily   Continuous Infusions:     LOS: 0 days    Time spent: 35  minutes    Aahil Fredin A Carrianne Hyun, MD [facility]   If 7PM-7AM, please contact night-coverage [email or web address]  11/02/2024, 1:58 PM

## 2024-11-02 NOTE — Plan of Care (Signed)

## 2024-11-02 NOTE — Progress Notes (Signed)
 Mobility Specialist Progress Note:   11/02/24 1353  Mobility  Activity  (Bed Exercises)  Level of Assistance Independent  Assistive Device None  Range of Motion/Exercises Active  Activity Response Tolerated well  Mobility Referral Yes  Mobility visit 1 Mobility  Mobility Specialist Start Time (ACUTE ONLY) 1340  Mobility Specialist Stop Time (ACUTE ONLY) 1350  Mobility Specialist Time Calculation (min) (ACUTE ONLY) 10 min   Pt was received in bed and agreed to bed mobility exercises. Seated BLE Exercises:  1) Heel Raise: 1 x 10 each leg  2) Knee Extension: 1 x 10  3) Toe Pumps: 1 x 10  4) Hip Adduction (pillow squeezes): 1 x 10 (1 second hold)  No complaints during ambulation. Returned to bed with all needs met. Call bell in reach.   [employer] - Mobility Specialist

## 2024-11-02 NOTE — Plan of Care (Signed)
  Problem: Education: Goal: Knowledge of General Education information will improve Description: Including pain rating scale, medication(s)/side effects and non-pharmacologic comfort measures Outcome: Progressing   Problem: Health Behavior/Discharge Planning: Goal: Ability to manage health-related needs will improve Outcome: Progressing   Problem: Clinical Measurements: Goal: Will remain free from infection Outcome: Progressing   Problem: Pain Managment: Goal: General experience of comfort will improve and/or be controlled Outcome: Progressing   Problem: Safety: Goal: Ability to remain free from injury will improve Outcome: Progressing   Problem: Skin Integrity: Goal: Risk for impaired skin integrity will decrease Outcome: Progressing

## 2024-11-03 DIAGNOSIS — R410 Disorientation, unspecified: Secondary | ICD-10-CM | POA: Diagnosis not present

## 2024-11-03 MED ORDER — GABAPENTIN 100 MG PO CAPS
100.0000 mg | ORAL_CAPSULE | Freq: Once | ORAL | Status: AC
  Administered 2024-11-03: 100 mg via ORAL
  Filled 2024-11-03: qty 1

## 2024-11-03 MED ORDER — IBUPROFEN 200 MG PO TABS
200.0000 mg | ORAL_TABLET | Freq: Four times a day (QID) | ORAL | Status: DC | PRN
  Administered 2024-11-03 – 2024-11-05 (×4): 200 mg via ORAL
  Filled 2024-11-03 (×4): qty 1

## 2024-11-03 MED ORDER — GABAPENTIN 100 MG PO CAPS
200.0000 mg | ORAL_CAPSULE | Freq: Two times a day (BID) | ORAL | Status: DC
  Administered 2024-11-03 – 2024-11-05 (×4): 200 mg via ORAL
  Filled 2024-11-03 (×4): qty 2

## 2024-11-03 NOTE — Progress Notes (Signed)
 PROGRESS NOTE    Jody Wilson  FMW:[id number] DOB: May 01, 1951 DOA: 10/29/2024 PCP: Dettinger, Fonda LABOR, MD   Brief Narrative: 73 year old woman with a recent closed fracture right humerus, vertebral and pubic fractures in October, sent from nursing home for evaluation of altered mental status with hallucinations. This was also seen on previous admission was thought to be secondary to polypharmacy, particularly oxycodone . Oxycodone  was stopped on that hospitalization, patient's mentation improved and was discharged. However at the nursing home she was started on oxycodone  for uncontrolled pain, started to hallucinate and was sent to the emergency department. Admitted for acute toxic encephalopathy.   Stable for discharge, awaiting placement.   Assessment & Plan:   Principal Problem:   Toxic encephalopathy Active Problems:   Anxiety   Closed fracture of right inferior pubic ramus (HCC)   Visual hallucination   Polypharmacy  1-Acute toxic encephalopathy Visual  hallucinations Polypharmacy -Patient presents with hallucination and confusion, in the setting of polypharmacy: Oxycodone  and tramadol  and Flexeril  discontinued. Is back to baseline. -She has been tolerating Gabapentin.   -stable.   Subacute fracture of right L4, bilateral L5 transverse process Acute bilateral sacral fractures History Right humerus fracture. Repeated X ray which was evaluated by ortho Dr Beverley, who recommend continue NWB shoulder and use of Sling.  - Continue follow-up with orthopedic as an outpatient - Tylenol  for pain, will add Ibuprofen.  Increase Gabapentin to help with pain.  Discussed CT with neurosurgery, she can wear brace. Pain management.   Anxiety depression Continue Celexa  and Wellbutrin  On Xanax  as needed, will recommend to wean this medication over time   Microscopic hematuria.  Needs follow up out patient.  Repeat UA out patient.  Holding lovenox , and Toradol . Per nurse urine was  getting darker yesterday.    Estimated body mass index is 20.47 kg/m as calculated from the following:   Height as of this encounter: 5' 5 (1.651 m).   Weight as of this encounter: 55.8 kg.   DVT prophylaxis: SCD Code Status: Full code Family Communication: Disposition Plan:  Status is: Observation The patient remains OBS appropriate and will d/c before 2 midnights.    Consultants:  none  Antimicrobials:    Subjective: She is not feeling well today. Report having tingling pain buttocks.   Objective: Vitals:   11/02/24 0436 11/02/24 1413 11/02/24 1932 11/03/24 0625  BP: (!) 160/77 (!) 140/74 132/72 (!) 144/85  Pulse: 69 69 64 66  Resp: 15 16 18 20   Temp: 98.2 F (36.8 C) 98.2 F (36.8 C) 98.9 F (37.2 C) 98.2 F (36.8 C)  TempSrc: Oral  Oral Oral  SpO2: 99% 97% 96%   Weight:      Height:        Intake/Output Summary (Last 24 hours) at 11/03/2024 1422 Last data filed at 11/03/2024 9160 Gross per 24 hour  Intake 480 ml  Output 1550 ml  Net -1070 ml   Filed Weights   10/29/24 1515  Weight: 55.8 kg    Examination:  General exam: NAD Respiratory system: CTA Cardiovascular system: S 1, S 2 RRR Gastrointestinal system: BS present, soft, nt Central nervous system: Alert.  Extremities: Symmetric 5 x 5 power.   Data Reviewed: I have personally reviewed following labs and imaging studies  CBC: Recent Labs  Lab 10/29/24 0847  WBC 11.4*  HGB 13.7  HCT 42.6  MCV 101.2*  PLT 460*   Basic Metabolic Panel: Recent Labs  Lab 10/29/24 0847 11/02/24 0941  NA 138  139  K 4.3 4.1  CL 103 105  CO2 24 26  GLUCOSE 114* 97  BUN 14 22  CREATININE 0.65 0.67  CALCIUM  9.8 9.4   GFR: Estimated Creatinine Clearance: 56 mL/min (by C-G formula based on SCr of 0.67 mg/dL). Liver Function Tests: No results for input(s): AST, ALT, ALKPHOS, BILITOT, PROT, ALBUMIN in the last 168 hours. No results for input(s): LIPASE, AMYLASE in the last 168  hours. Recent Labs  Lab 10/29/24 1148  AMMONIA 25   Coagulation Profile: No results for input(s): INR, PROTIME in the last 168 hours. Cardiac Enzymes: No results for input(s): CKTOTAL, CKMB, CKMBINDEX, TROPONINI in the last 168 hours. BNP (last 3 results) No results for input(s): PROBNP in the last 8760 hours. HbA1C: No results for input(s): HGBA1C in the last 72 hours. CBG: No results for input(s): GLUCAP in the last 168 hours. Lipid Profile: No results for input(s): CHOL, HDL, LDLCALC, TRIG, CHOLHDL, LDLDIRECT in the last 72 hours. Thyroid  Function Tests: No results for input(s): TSH, T4TOTAL, FREET4, T3FREE, THYROIDAB in the last 72 hours. Anemia Panel: No results for input(s): VITAMINB12, FOLATE, FERRITIN, TIBC, IRON, RETICCTPCT in the last 72 hours. Sepsis Labs: No results for input(s): PROCALCITON, LATICACIDVEN in the last 168 hours.  Recent Results (from the past 240 hours)  Resp panel by RT-PCR (RSV, Flu A&B, Covid) Anterior Nasal Swab     Status: None   Collection Time: 10/29/24  8:33 AM   Specimen: Anterior Nasal Swab  Result Value Ref Range Status   SARS Coronavirus 2 by RT PCR NEGATIVE NEGATIVE Final    Comment: (NOTE) SARS-CoV-2 target nucleic acids are NOT DETECTED.  The SARS-CoV-2 RNA is generally detectable in upper respiratory specimens during the acute phase of infection. The lowest concentration of SARS-CoV-2 viral copies this assay can detect is 138 copies/mL. A negative result does not preclude SARS-Cov-2 infection and should not be used as the sole basis for treatment or other patient management decisions. A negative result may occur with  improper specimen collection/handling, submission of specimen other than nasopharyngeal swab, presence of viral mutation(s) within the areas targeted by this assay, and inadequate number of viral copies(<138 copies/mL). A negative result must be combined  with clinical observations, patient history, and epidemiological information. The expected result is Negative.  Fact Sheet for Patients:  bloggercourse.com  Fact Sheet for Healthcare Providers:  seriousbroker.it  This test is no t yet approved or cleared by the [place]  FDA and  has been authorized for detection and/or diagnosis of SARS-CoV-2 by FDA under an Emergency Use Authorization (EUA). This EUA will remain  in effect (meaning this test can be used) for the duration of the COVID-19 declaration under Section 564(b)(1) of the Act, 21 U.S.C.section 360bbb-3(b)(1), unless the authorization is terminated  or revoked sooner.       Influenza A by PCR NEGATIVE NEGATIVE Final   Influenza B by PCR NEGATIVE NEGATIVE Final    Comment: (NOTE) The Xpert Xpress SARS-CoV-2/FLU/RSV plus assay is intended as an aid in the diagnosis of influenza from Nasopharyngeal swab specimens and should not be used as a sole basis for treatment. Nasal washings and aspirates are unacceptable for Xpert Xpress SARS-CoV-2/FLU/RSV testing.  Fact Sheet for Patients: bloggercourse.com  Fact Sheet for Healthcare Providers: seriousbroker.it  This test is not yet approved or cleared by the [place]  FDA and has been authorized for detection and/or diagnosis of SARS-CoV-2 by FDA under an Emergency Use Authorization (EUA). This EUA will remain in  effect (meaning this test can be used) for the duration of the COVID-19 declaration under Section 564(b)(1) of the Act, 21 U.S.C. section 360bbb-3(b)(1), unless the authorization is terminated or revoked.     Resp Syncytial Virus by PCR NEGATIVE NEGATIVE Final    Comment: (NOTE) Fact Sheet for Patients: bloggercourse.com  Fact Sheet for Healthcare Providers: seriousbroker.it  This test is not yet approved  or cleared by the [place]  FDA and has been authorized for detection and/or diagnosis of SARS-CoV-2 by FDA under an Emergency Use Authorization (EUA). This EUA will remain in effect (meaning this test can be used) for the duration of the COVID-19 declaration under Section 564(b)(1) of the Act, 21 U.S.C. section 360bbb-3(b)(1), unless the authorization is terminated or revoked.  Performed at [facility], [address] [place], [place] [address]          Radiology Studies: No results found.       Scheduled Meds:  acetaminophen   1,000 mg Oral TID   aspirin  EC  81 mg Oral Daily   buPROPion   150 mg Oral Daily   carvedilol   12.5 mg Oral BID WC   citalopram   40 mg Oral Daily   vitamin B-12  500 mcg Oral Daily   docusate sodium   100 mg Oral BID   feeding supplement  237 mL Oral BID BM   gabapentin  200 mg Oral BID   lidocaine   1 patch Transdermal Q24H   pantoprazole   40 mg Oral Daily   pravastatin   40 mg Oral Daily   Continuous Infusions:  lactated ringers  100 mL/hr at 11/03/24 0403      LOS: 0 days    Time spent: 35  minutes    Jody Wilson A Vivek Grealish, MD [facility]   If 7PM-7AM, please contact night-coverage [email or web address]  11/03/2024, 2:22 PM

## 2024-11-03 NOTE — TOC Progression Note (Signed)
 Transition of Care [facility]) - Progression Note    Patient Details  Name: Jody Wilson MRN: [id number] Date of Birth: November 24, 1951  Transition of Care [facility]) CM/SW Contact  Heather DELENA Saltness, LCSW Phone Number: 11/03/2024, 1:06 PM  Clinical Narrative:    CSW spoke with pt's son, Shakyla Nolley [phone], via phone call to discuss SNF bed offers and obtain facility choice. Pt's son chooses [facility] in [place]. CSW notified Kristin at [facility], who reports pt can admit to facility on Monday 11/17. CSW submitted insurance authorization request, currently pending. TOC will continue to follow.  Medicare Star-Ratings  [facility] [address] [place], [place] [address] [phone] Overall rating ?? Below average  [facility] [address] [place], [place] [address] [phone] Overall rating ? Much below average  [facility] [address] [place], [place] [address] [phone] Overall rating ??? Average   Expected Discharge Plan: Skilled Nursing Facility Barriers to Discharge: SNF Pending bed offer  Expected Discharge Plan and Services In-house Referral: Clinical Social Work Discharge Planning Services: NA Post Acute Care Choice: NA Living arrangements for the past 2 months: Single Family Home Expected Discharge Date: 10/31/24               DME Arranged: N/A DME Agency: NA       HH Arranged: NA HH Agency: NA         Social Drivers of Health (SDOH) Interventions SDOH Screenings   Food Insecurity: No Food Insecurity (10/29/2024)  Housing: Low Risk  (10/29/2024)  Transportation Needs: No Transportation Needs (10/29/2024)  Utilities: Not At Risk (10/29/2024)  Alcohol Screen: Low Risk  (12/27/2023)  Depression (PHQ2-9): Low Risk  (07/24/2024)  Recent Concern: Depression (PHQ2-9) - High Risk (07/06/2024)  Financial Resource Strain: Low Risk  (12/27/2023)  Physical Activity:  Inactive (12/27/2023)  Social Connections: Patient Declined (10/29/2024)  Recent Concern: Social Connections - Moderately Isolated (10/16/2024)  Stress: No Stress Concern Present (12/27/2023)  Tobacco Use: Medium Risk (10/29/2024)  Health Literacy: Adequate Health Literacy (12/27/2023)    Readmission Risk Interventions    10/22/2024    2:33 PM 04/13/2024    9:01 AM  Readmission Risk Prevention Plan  Medication Screening  Complete  Transportation Screening Complete Complete  HRI or Home Care Consult Complete   Social Work Consult for Recovery Care Planning/Counseling Complete   Palliative Care Screening Not Applicable   Medication Review Oceanographer) Complete     Signed: Heather Saltness, MSW, LCSW Clinical Social Worker Inpatient Care Management 11/03/2024 1:10 PM

## 2024-11-03 NOTE — Progress Notes (Signed)
 Orthopedic Tech Progress Note Patient Details:  Jody Wilson 03-26-1951 [id number]  Ortho Devices Type of Ortho Device: Thoracolumbar corset (TLSO) Ortho Device/Splint Location: Explained how to remove and reapply TLSO. Pt. stated understanding. Was working with PT after application as I was leaving room Ortho Device/Splint Interventions: Ordered, Application, Adjustment   Post Interventions Patient Tolerated: Well Instructions Provided: Care of device, Poper ambulation with device, Adjustment of device  Jody Wilson 11/03/2024, 3:03 PM

## 2024-11-03 NOTE — Plan of Care (Signed)

## 2024-11-03 NOTE — Progress Notes (Signed)
 Physical Therapy Treatment Patient Details Name: Jody Wilson MRN: [id number] DOB: [birth year] Today's Date: 11/03/2024   History of Present Illness Pt is a 73 y/o F admitted on 10/29/24 after presenting for evaluation of AMS & hallucinations. Pt is being treated for acute toxic encephalopathy, polypharmacy. Lumbar CT 10/29/24 showed subacute fx of right L4 and bilateral L5 transverse processes.  PMH: R proximal humerus fx, vertebral & pubic rami (sup and inf), sacral ala fx (October 2025), HTN, HLD, bradycardia, iron deficiency anemia, PE, CAD, anxiety    PT Comments  Pt fitted for TLSO by orthotech during PT session.  Pt able to stand at edge of bed with quad cane and take 3 side steps, tolerance limited by pain with weightshifting on to LLE. Pt took several pivotal steps from bed to recliner and performed seated BLE strengthening exercises. Good progress with activity tolerance today.   If plan is discharge home, recommend the following: A little help with walking and/or transfers;A little help with bathing/dressing/bathroom;Assist for transportation;Help with stairs or ramp for entrance;Assistance with cooking/housework   Can travel by private vehicle     Yes  Equipment Recommendations  None recommended by PT    Recommendations for Other Services       Precautions / Restrictions Precautions Precautions: Back Recall of Precautions/Restrictions: Intact Precaution/Restrictions Comments: sling RUE Required Braces or Orthoses: Spinal Brace;Sling Spinal Brace: Thoracolumbosacral orthotic;Applied in sitting position Restrictions Weight Bearing Restrictions Per Provider Order: Yes RUE Weight Bearing Per Provider Order: Non weight bearing RLE Weight Bearing Per Provider Order: Weight bearing as tolerated     Mobility  Bed Mobility   Bed Mobility: Rolling, Sidelying to Sit Rolling: Supervision Sidelying to sit: Min assist, HOB elevated       General bed mobility comments:  assist to raise trunk and pivot hips to EOB    Transfers Overall transfer level: Needs assistance Equipment used: Quad cane Transfers: Sit to/from Stand Sit to Stand: Contact guard assist   Step pivot transfers: Min assist       General transfer comment: min A for balance, bed to recliner transfer with [employer]    Ambulation/Gait Ambulation/Gait assistance: Min assist Gait Distance (Feet): 3 Feet Assistive device: Quad cane Gait Pattern/deviations: Step-through pattern, Decreased step length - right Gait velocity: decr     General Gait Details: 3 side steps at EOB, tolerance limited by increased pain with weightshift onto LLE   Stairs             Wheelchair Mobility     Tilt Bed    Modified Rankin (Stroke Patients Only)       Balance Overall balance assessment: Needs assistance   Sitting balance-Leahy Scale: Fair     Standing balance support: During functional activity, Single extremity supported Standing balance-Leahy Scale: Fair Standing balance comment: can release cane in static standing, needs min assist for transfers                            Communication Communication Communication: No apparent difficulties  Cognition Arousal: Alert Behavior During Therapy: WFL for tasks assessed/performed   PT - Cognitive impairments: No apparent impairments                         Following commands: Intact      Cueing Cueing Techniques: Verbal cues  Exercises General Exercises - Lower Extremity Ankle Circles/Pumps: AROM, Both, 10 reps, Seated Long Arc Quad:  AROM, Both, 10 reps, Seated Hip Flexion/Marching: AROM, Both, 10 reps, Seated    General Comments        Pertinent Vitals/Pain Pain Assessment Pain Score: 7  Pain Location: L groin Pain Descriptors / Indicators: Sore Pain Intervention(s): Limited activity within patient's tolerance, Monitored during session, Repositioned, Premedicated before session    Home Living                           Prior Function            PT Goals (current goals can now be found in the care plan section) Acute Rehab PT Goals Patient Stated Goal: decreased pain, get better overall PT Goal Formulation: With patient Time For Goal Achievement: 11/14/24 Potential to Achieve Goals: Good Progress towards PT goals: Progressing toward goals    Frequency    Min 2X/week      PT Plan      Co-evaluation              AM-PAC PT 6 Clicks Mobility   Outcome Measure  Help needed turning from your back to your side while in a flat bed without using bedrails?: A Little Help needed moving from lying on your back to sitting on the side of a flat bed without using bedrails?: A Little Help needed moving to and from a bed to a chair (including a wheelchair)?: A Little Help needed standing up from a chair using your arms (e.g., wheelchair or bedside chair)?: A Little Help needed to walk in hospital room?: A Lot Help needed climbing 3-5 steps with a railing? : Total 6 Click Score: 15    End of Session Equipment Utilized During Treatment: Gait belt Activity Tolerance: Patient limited by pain;Patient tolerated treatment well Patient left: in chair;with chair alarm set;with call bell/phone within reach Nurse Communication: Mobility status PT Visit Diagnosis: Muscle weakness (generalized) (M62.81);Other abnormalities of gait and mobility (R26.89);Difficulty in walking, not elsewhere classified (R26.2);Unsteadiness on feet (R26.81);Pain     Time: 8556-8491 PT Time Calculation (min) (ACUTE ONLY): 25 min  Charges:    $Gait Training: 8-22 mins $Therapeutic Exercise: 8-22 mins PT General Charges $$ ACUTE PT VISIT: 1 Visit                     Sylvan Delon Copp PT 11/03/2024  Acute Rehabilitation Services  Office [phone]

## 2024-11-04 DIAGNOSIS — R410 Disorientation, unspecified: Secondary | ICD-10-CM | POA: Diagnosis not present

## 2024-11-04 LAB — CBC
HCT: 42.2 % (ref 36.0–46.0)
Hemoglobin: 14 g/dL (ref 12.0–15.0)
MCH: 33.7 pg (ref 26.0–34.0)
MCHC: 33.2 g/dL (ref 30.0–36.0)
MCV: 101.4 fL — ABNORMAL HIGH (ref 80.0–100.0)
Platelets: 238 K/uL (ref 150–400)
RBC: 4.16 MIL/uL (ref 3.87–5.11)
RDW: 13.7 % (ref 11.5–15.5)
WBC: 4.5 K/uL (ref 4.0–10.5)
nRBC: 0 % (ref 0.0–0.2)

## 2024-11-04 LAB — VITAMIN B12: Vitamin B-12: 546 pg/mL (ref 180–914)

## 2024-11-04 MED ORDER — AMLODIPINE BESYLATE 5 MG PO TABS
2.5000 mg | ORAL_TABLET | Freq: Every day | ORAL | Status: DC
  Administered 2024-11-04 – 2024-11-05 (×2): 2.5 mg via ORAL
  Filled 2024-11-04 (×2): qty 1

## 2024-11-04 NOTE — TOC Progression Note (Signed)
 Transition of Care [facility]) - Progression Note    Patient Details  Name: Jody Wilson MRN: [id number] Date of Birth: 1951/09/10  Transition of Care [facility]) CM/SW Contact  Heather DELENA Saltness, LCSW Phone Number: 11/04/2024, 10:30 AM  Clinical Narrative:    Pt's insurance authorization for SNF rehab at [facility] is still pending. Pt to admit to [facility] tomorrow 11/17, pending insurance authorization approval. TOC will continue to follow.   Expected Discharge Plan: Skilled Nursing Facility Barriers to Discharge: SNF Pending bed offer   Expected Discharge Plan and Services In-house Referral: Clinical Social Work Discharge Planning Services: NA Post Acute Care Choice: NA Living arrangements for the past 2 months: Single Family Home Expected Discharge Date: 10/31/24               DME Arranged: N/A DME Agency: NA       HH Arranged: NA HH Agency: NA         Social Drivers of Health (SDOH) Interventions SDOH Screenings   Food Insecurity: No Food Insecurity (10/29/2024)  Housing: Low Risk  (10/29/2024)  Transportation Needs: No Transportation Needs (10/29/2024)  Utilities: Not At Risk (10/29/2024)  Alcohol Screen: Low Risk  (12/27/2023)  Depression (PHQ2-9): Low Risk  (07/24/2024)  Recent Concern: Depression (PHQ2-9) - High Risk (07/06/2024)  Financial Resource Strain: Low Risk  (12/27/2023)  Physical Activity: Inactive (12/27/2023)  Social Connections: Patient Declined (10/29/2024)  Recent Concern: Social Connections - Moderately Isolated (10/16/2024)  Stress: No Stress Concern Present (12/27/2023)  Tobacco Use: Medium Risk (10/29/2024)  Health Literacy: Adequate Health Literacy (12/27/2023)    Readmission Risk Interventions    10/22/2024    2:33 PM 04/13/2024    9:01 AM  Readmission Risk Prevention Plan  Medication Screening  Complete  Transportation Screening Complete Complete  HRI or Home Care Consult Complete   Social Work Consult for Recovery Care  Planning/Counseling Complete   Palliative Care Screening Not Applicable   Medication Review Oceanographer) Complete     Signed: Heather Saltness, MSW, LCSW Clinical Social Worker Inpatient Care Management 11/04/2024 10:31 AM

## 2024-11-04 NOTE — Plan of Care (Signed)
  Problem: Clinical Measurements: Goal: Ability to maintain clinical measurements within normal limits will improve Outcome: Progressing   Problem: Activity: Goal: Risk for activity intolerance will decrease Outcome: Progressing   Problem: Nutrition: Goal: Adequate nutrition will be maintained Outcome: Progressing   Problem: Pain Managment: Goal: General experience of comfort will improve and/or be controlled Outcome: Progressing

## 2024-11-04 NOTE — Progress Notes (Addendum)
 PROGRESS NOTE    Jody Wilson  FMW:[id number] DOB: 04/14/1951 DOA: 10/29/2024 PCP: Dettinger, Fonda LABOR, MD   Brief Narrative: 73 year old woman with a recent closed fracture right humerus, vertebral and pubic fractures in October, sent from nursing home for evaluation of altered mental status with hallucinations. This was also seen on previous admission was thought to be secondary to polypharmacy, particularly oxycodone . Oxycodone  was stopped on that hospitalization, patient's mentation improved and was discharged. However at the nursing home she was started on oxycodone  for uncontrolled pain, started to hallucinate and was sent to the emergency department. Admitted for acute toxic encephalopathy.   Stable for discharge, awaiting placement.   Assessment & Plan:   Principal Problem:   Toxic encephalopathy Active Problems:   Anxiety   Closed fracture of right inferior pubic ramus (HCC)   Visual hallucination   Polypharmacy  1-Acute toxic encephalopathy Visual  hallucinations Polypharmacy -Patient presents with hallucination and confusion, in the setting of polypharmacy: Oxycodone  and tramadol  and Flexeril  discontinued. Is back to baseline. -She has been tolerating Gabapentin.   -stable.   Subacute fracture of right L4, bilateral L5 transverse process Acute bilateral sacral fractures History Right humerus fracture. Repeated X ray which was evaluated by ortho Dr Beverley, who recommend continue NWB shoulder and use of Sling.  - Continue follow-up with orthopedic as an outpatient - Tylenol  for pain, will add Ibuprofen.  Discussed CT with neurosurgery, she can wear brace. Pain management.  Report increased dose of gabapentin is helping with pain.   Anxiety depression Continue Celexa  and Wellbutrin  On Xanax  as needed, will recommend to wean this medication over time   Microscopic hematuria.  Needs follow up out patient.  Repeat UA out patient.  Holding lovenox , and Toradol .  Per nurse urine was getting darker yesterday.   HTN;  BP elevated. Continue coreg .  Start low dose amlodipine.   Estimated body mass index is 20.47 kg/m as calculated from the following:   Height as of this encounter: 5' 5 (1.651 m).   Weight as of this encounter: 55.8 kg.   DVT prophylaxis: SCD Code Status: Full code Family Communication:Son updated 11/15 Disposition Plan:  Status is: Observation The patient remains OBS appropriate and will d/c before 2 midnights.    Consultants:  none  Antimicrobials:    Subjective: Alert, conversant , report gabapentin is helping with neuropathic pain   Objective: Vitals:   11/03/24 0625 11/03/24 2200 11/04/24 0517 11/04/24 1217  BP: (!) 144/85 (!) 164/78 (!) 160/89 (!) 150/91  Pulse: 66 66 66 73  Resp: 20 18 18 20   Temp: 98.2 F (36.8 C) 98.6 F (37 C) 98.4 F (36.9 C) 98.4 F (36.9 C)  TempSrc: Oral Oral Oral Oral  SpO2:  96% 97% 96%  Weight:      Height:        Intake/Output Summary (Last 24 hours) at 11/04/2024 1647 Last data filed at 11/04/2024 1300 Gross per 24 hour  Intake 480 ml  Output 2400 ml  Net -1920 ml   Filed Weights   10/29/24 1515  Weight: 55.8 kg    Examination:  General exam: NAD Respiratory system: CTA Cardiovascular system: S 1, S 2 RRR Gastrointestinal system: BBS present, soft nt Central nervous system: a;ert Extremities: Symmetric 5 x 5 power.   Data Reviewed: I have personally reviewed following labs and imaging studies  CBC: Recent Labs  Lab 10/29/24 0847 11/04/24 0607  WBC 11.4* 4.5  HGB 13.7 14.0  HCT 42.6 42.2  MCV 101.2* 101.4*  PLT 460* 238   Basic Metabolic Panel: Recent Labs  Lab 10/29/24 0847 11/02/24 0941  NA 138 139  K 4.3 4.1  CL 103 105  CO2 24 26  GLUCOSE 114* 97  BUN 14 22  CREATININE 0.65 0.67  CALCIUM  9.8 9.4   GFR: Estimated Creatinine Clearance: 56 mL/min (by C-G formula based on SCr of 0.67 mg/dL). Liver Function Tests: No results for  input(s): AST, ALT, ALKPHOS, BILITOT, PROT, ALBUMIN in the last 168 hours. No results for input(s): LIPASE, AMYLASE in the last 168 hours. Recent Labs  Lab 10/29/24 1148  AMMONIA 25   Coagulation Profile: No results for input(s): INR, PROTIME in the last 168 hours. Cardiac Enzymes: No results for input(s): CKTOTAL, CKMB, CKMBINDEX, TROPONINI in the last 168 hours. BNP (last 3 results) No results for input(s): PROBNP in the last 8760 hours. HbA1C: No results for input(s): HGBA1C in the last 72 hours. CBG: No results for input(s): GLUCAP in the last 168 hours. Lipid Profile: No results for input(s): CHOL, HDL, LDLCALC, TRIG, CHOLHDL, LDLDIRECT in the last 72 hours. Thyroid  Function Tests: No results for input(s): TSH, T4TOTAL, FREET4, T3FREE, THYROIDAB in the last 72 hours. Anemia Panel: Recent Labs    11/04/24 0607  VITAMINB12 546   Sepsis Labs: No results for input(s): PROCALCITON, LATICACIDVEN in the last 168 hours.  Recent Results (from the past 240 hours)  Resp panel by RT-PCR (RSV, Flu A&B, Covid) Anterior Nasal Swab     Status: None   Collection Time: 10/29/24  8:33 AM   Specimen: Anterior Nasal Swab  Result Value Ref Range Status   SARS Coronavirus 2 by RT PCR NEGATIVE NEGATIVE Final    Comment: (NOTE) SARS-CoV-2 target nucleic acids are NOT DETECTED.  The SARS-CoV-2 RNA is generally detectable in upper respiratory specimens during the acute phase of infection. The lowest concentration of SARS-CoV-2 viral copies this assay can detect is 138 copies/mL. A negative result does not preclude SARS-Cov-2 infection and should not be used as the sole basis for treatment or other patient management decisions. A negative result may occur with  improper specimen collection/handling, submission of specimen other than nasopharyngeal swab, presence of viral mutation(s) within the areas targeted by this assay, and  inadequate number of viral copies(<138 copies/mL). A negative result must be combined with clinical observations, patient history, and epidemiological information. The expected result is Negative.  Fact Sheet for Patients:  bloggercourse.com  Fact Sheet for Healthcare Providers:  seriousbroker.it  This test is no t yet approved or cleared by the [place]  FDA and  has been authorized for detection and/or diagnosis of SARS-CoV-2 by FDA under an Emergency Use Authorization (EUA). This EUA will remain  in effect (meaning this test can be used) for the duration of the COVID-19 declaration under Section 564(b)(1) of the Act, 21 U.S.C.section 360bbb-3(b)(1), unless the authorization is terminated  or revoked sooner.       Influenza A by PCR NEGATIVE NEGATIVE Final   Influenza B by PCR NEGATIVE NEGATIVE Final    Comment: (NOTE) The Xpert Xpress SARS-CoV-2/FLU/RSV plus assay is intended as an aid in the diagnosis of influenza from Nasopharyngeal swab specimens and should not be used as a sole basis for treatment. Nasal washings and aspirates are unacceptable for Xpert Xpress SARS-CoV-2/FLU/RSV testing.  Fact Sheet for Patients: bloggercourse.com  Fact Sheet for Healthcare Providers: seriousbroker.it  This test is not yet approved or cleared by the [place]  FDA and has been  authorized for detection and/or diagnosis of SARS-CoV-2 by FDA under an Emergency Use Authorization (EUA). This EUA will remain in effect (meaning this test can be used) for the duration of the COVID-19 declaration under Section 564(b)(1) of the Act, 21 U.S.C. section 360bbb-3(b)(1), unless the authorization is terminated or revoked.     Resp Syncytial Virus by PCR NEGATIVE NEGATIVE Final    Comment: (NOTE) Fact Sheet for Patients: bloggercourse.com  Fact Sheet for Healthcare  Providers: seriousbroker.it  This test is not yet approved or cleared by the [place]  FDA and has been authorized for detection and/or diagnosis of SARS-CoV-2 by FDA under an Emergency Use Authorization (EUA). This EUA will remain in effect (meaning this test can be used) for the duration of the COVID-19 declaration under Section 564(b)(1) of the Act, 21 U.S.C. section 360bbb-3(b)(1), unless the authorization is terminated or revoked.  Performed at [facility], [address], [place], [place] [address]          Radiology Studies: No results found.       Scheduled Meds:  acetaminophen   1,000 mg Oral TID   aspirin  EC  81 mg Oral Daily   buPROPion   150 mg Oral Daily   carvedilol   12.5 mg Oral BID WC   citalopram   40 mg Oral Daily   vitamin B-12  500 mcg Oral Daily   docusate sodium   100 mg Oral BID   feeding supplement  237 mL Oral BID BM   gabapentin  200 mg Oral BID   lidocaine   1 patch Transdermal Q24H   pantoprazole   40 mg Oral Daily   pravastatin   40 mg Oral Daily   Continuous Infusions:      LOS: 0 days    Time spent: 35  minutes    Jamecia Lerman A Yolanda Huffstetler, MD [facility]   If 7PM-7AM, please contact night-coverage [email or web address]  11/04/2024, 4:47 PM

## 2024-11-05 ENCOUNTER — Other Ambulatory Visit: Payer: Self-pay | Admitting: *Deleted

## 2024-11-05 ENCOUNTER — Telehealth: Payer: Self-pay | Admitting: Pharmacist

## 2024-11-05 DIAGNOSIS — R410 Disorientation, unspecified: Secondary | ICD-10-CM | POA: Diagnosis not present

## 2024-11-05 MED ORDER — PANTOPRAZOLE SODIUM 40 MG PO TBEC: 40.0000 mg | DELAYED_RELEASE_TABLET | Freq: Every day | ORAL | 0 refills | Status: DC

## 2024-11-05 MED ORDER — CITALOPRAM HYDROBROMIDE 40 MG PO TABS: 40.0000 mg | ORAL_TABLET | Freq: Every day | ORAL | 0 refills | Status: AC

## 2024-11-05 MED ORDER — BUPROPION HCL ER (XL) 150 MG PO TB24: 150.0000 mg | ORAL_TABLET | Freq: Every day | ORAL | 0 refills | Status: AC

## 2024-11-05 MED ORDER — IBUPROFEN 200 MG PO TABS: 200.0000 mg | ORAL_TABLET | Freq: Four times a day (QID) | ORAL | 0 refills | Status: AC | PRN

## 2024-11-05 MED ORDER — ALPRAZOLAM 0.25 MG PO TABS: 0.2500 mg | ORAL_TABLET | Freq: Two times a day (BID) | ORAL | 0 refills | Status: DC | PRN

## 2024-11-05 MED ORDER — GABAPENTIN 100 MG PO CAPS: 200.0000 mg | ORAL_CAPSULE | Freq: Two times a day (BID) | ORAL | 0 refills | Status: DC

## 2024-11-05 MED ORDER — CYANOCOBALAMIN 500 MCG PO TABS: 500.0000 ug | ORAL_TABLET | Freq: Every day | ORAL | 0 refills | Status: AC

## 2024-11-05 MED ORDER — LIDOCAINE 5 % EX PTCH: 1.0000 | MEDICATED_PATCH | CUTANEOUS | 0 refills | Status: AC

## 2024-11-05 MED ORDER — AMLODIPINE BESYLATE 2.5 MG PO TABS: 2.5000 mg | ORAL_TABLET | Freq: Every day | ORAL | 0 refills | Status: DC

## 2024-11-05 NOTE — Progress Notes (Signed)
 Report is given to RN  Arland in [facility].

## 2024-11-05 NOTE — TOC Transition Note (Signed)
 Transition of Care [facility]) - Discharge Note   Patient Details  Name: Jody Wilson MRN: [id number] Date of Birth: 1951/07/21  Transition of Care [facility]) CM/SW Contact:  Heather DELENA Saltness, LCSW Phone Number: 11/05/2024, 11:47 AM   Clinical Narrative:    Pt discharging to [facility] today for SNF rehab. Pt admitting to room 38. D/C packet placed in pt's chart at RN station. RN to call report to [phone]. PTAR called at 11:56 AM. Pt's son, Jody Wilson [phone], made aware and in agreement with discharge plan. No further TOC needs at this time.   Final next level of care: Skilled Nursing Facility Barriers to Discharge: Barriers Resolved   Patient Goals and CMS Choice Patient states their goals for this hospitalization and ongoing recovery are:: To go to [facility].gov Compare Post Acute Care list provided to:: Patient Represenative (must comment) Choice offered to / list presented to : Adult Children [place] ownership interest in [facility].provided to:: Adult Children    Discharge Placement        Patient chooses bed at: [facility] Patient to be transferred to facility by: PTAR Name of family member notified: Pt's son, Anah Billard Patient and family notified of of transfer: 11/05/24  Discharge Plan and Services Additional resources added to the After Visit Summary for  Follow Up In-house Referral: Clinical Social Work Discharge Planning Services: NA Post Acute Care Choice: NA          DME Arranged: N/A DME Agency: NA       HH Arranged: NA HH Agency: NA        Social Drivers of Health (SDOH) Interventions SDOH Screenings   Food Insecurity: No Food Insecurity (10/29/2024)  Housing: Low Risk  (10/29/2024)  Transportation Needs: No Transportation Needs (10/29/2024)  Utilities: Not At Risk (10/29/2024)  Alcohol Screen: Low Risk  (12/27/2023)  Depression (PHQ2-9): Low Risk  (07/24/2024)  Recent Concern: Depression  (PHQ2-9) - High Risk (07/06/2024)  Financial Resource Strain: Low Risk  (12/27/2023)  Physical Activity: Inactive (12/27/2023)  Social Connections: Patient Declined (10/29/2024)  Recent Concern: Social Connections - Moderately Isolated (10/16/2024)  Stress: No Stress Concern Present (12/27/2023)  Tobacco Use: Medium Risk (10/29/2024)  Health Literacy: Adequate Health Literacy (12/27/2023)     Readmission Risk Interventions    10/22/2024    2:33 PM 04/13/2024    9:01 AM  Readmission Risk Prevention Plan  Medication Screening  Complete  Transportation Screening Complete Complete  HRI or Home Care Consult Complete   Social Work Consult for Recovery Care Planning/Counseling Complete   Palliative Care Screening Not Applicable   Medication Review Oceanographer) Complete     Signed: Heather Saltness, MSW, LCSW Clinical Social Worker Inpatient Care Management 11/05/2024 12:38 PM

## 2024-11-05 NOTE — Progress Notes (Signed)
 Physical Therapy Treatment Patient Details Name: Jody Wilson MRN: [id number] DOB: November 14, 1951 Today's Date: 11/05/2024   History of Present Illness Pt is a 73 y/o F admitted on 10/29/24 after presenting for evaluation of AMS & hallucinations. Pt is being treated for acute toxic encephalopathy, polypharmacy. Lumbar CT 10/29/24 showed subacute fx of right L4 and bilateral L5 transverse processes.  PMH: R proximal humerus fx, vertebral & pubic rami (sup and inf), sacral ala fx (October 2025), HTN, HLD, bradycardia, iron deficiency anemia, PE, CAD, anxiety    PT Comments   Pt admitted with above diagnosis.  Pt currently with functional limitations due to the deficits listed below (see PT Problem List). Pt in bed when PT arrived. Pt agreeable to therapy intervention. Pt reports she is going to another SNF today. Pt required increased time and cues with use of hospital bed features for supine to sit, sit to stand from EOB CGA and from commode min A, gait tasks with quad cane, CGA to min A for safety and stability with increased lateral sway and decreased LLE stance time with reports of L buttock pain. Pt required total A to don TLSO and for adjusting R UE sling appropriately. Pt requested to return to bed and all needs in place with R UE elevated on pillows and nurse present.  Pt will benefit from acute skilled PT to increase their independence and safety with mobility to allow discharge.      If plan is discharge home, recommend the following: A little help with walking and/or transfers;A little help with bathing/dressing/bathroom;Assist for transportation;Help with stairs or ramp for entrance;Assistance with cooking/housework   Can travel by private vehicle     Yes  Equipment Recommendations  None recommended by PT    Recommendations for Other Services Rehab consult     Precautions / Restrictions Precautions Precautions: Back Recall of Precautions/Restrictions:  Intact Precaution/Restrictions Comments: sling RUE Required Braces or Orthoses: Spinal Brace;Sling (R UE sling) Spinal Brace: Thoracolumbosacral orthotic;Applied in sitting position Restrictions Weight Bearing Restrictions Per Provider Order: Yes RUE Weight Bearing Per Provider Order: Non weight bearing RLE Weight Bearing Per Provider Order: Weight bearing as tolerated Other Position/Activity Restrictions: B LE WBAT     Mobility  Bed Mobility Overal bed mobility: Needs Assistance Bed Mobility: Supine to Sit     Supine to sit: Supervision, HOB elevated     General bed mobility comments: min cues for back precuations    Transfers Overall transfer level: Needs assistance Equipment used: Quad cane Transfers: Sit to/from Stand Sit to Stand: Contact guard assist, Min assist           General transfer comment: CGA and cues for sit to stand from EOB and min A from commode    Ambulation/Gait Ambulation/Gait assistance: Min assist, Contact guard assist Gait Distance (Feet): 20 Feet Assistive device: Quad cane Gait Pattern/deviations: Step-through pattern, Decreased step length - right Gait velocity: decreased     General Gait Details: lateral sway noted, pt offloading L LE due to pain in buttock, min cues for safety and quad cane management   Stairs             Wheelchair Mobility     Tilt Bed    Modified Rankin (Stroke Patients Only)       Balance Overall balance assessment: Needs assistance Sitting-balance support: Feet supported Sitting balance-Leahy Scale: Fair     Standing balance support: During functional activity, Single extremity supported Standing balance-Leahy Scale: Fair  Communication Communication Communication: No apparent difficulties  Cognition Arousal: Alert Behavior During Therapy: WFL for tasks assessed/performed   PT - Cognitive impairments: No apparent impairments                          Following commands: Intact      Cueing Cueing Techniques: Verbal cues  Exercises      General Comments General comments (skin integrity, edema, etc.): pt had incontinent episode and required A for LE dressing and hygiene      Pertinent Vitals/Pain Pain Assessment Pain Assessment: 0-10 Pain Score: 7  Pain Location: L groin, L buttock Pain Descriptors / Indicators: Sore, Tightness, Guarding Pain Intervention(s): Limited activity within patient's tolerance, Monitored during session, Premedicated before session    Home Living                          Prior Function            PT Goals (current goals can now be found in the care plan section) Acute Rehab PT Goals Patient Stated Goal: decreased pain, get better overall PT Goal Formulation: With patient Time For Goal Achievement: 11/14/24 Potential to Achieve Goals: Good Progress towards PT goals: Progressing toward goals    Frequency    Min 2X/week      PT Plan      Co-evaluation              AM-PAC PT 6 Clicks Mobility   Outcome Measure  Help needed turning from your back to your side while in a flat bed without using bedrails?: A Little Help needed moving from lying on your back to sitting on the side of a flat bed without using bedrails?: A Little Help needed moving to and from a bed to a chair (including a wheelchair)?: A Little Help needed standing up from a chair using your arms (e.g., wheelchair or bedside chair)?: A Little Help needed to walk in hospital room?: A Little Help needed climbing 3-5 steps with a railing? : Total 6 Click Score: 16    End of Session Equipment Utilized During Treatment: Gait belt Activity Tolerance: Patient limited by pain Patient left: with call bell/phone within reach;in bed;with nursing/sitter in room Nurse Communication: Mobility status PT Visit Diagnosis: Muscle weakness (generalized) (M62.81);Other abnormalities of gait and mobility  (R26.89);Difficulty in walking, not elsewhere classified (R26.2);Unsteadiness on feet (R26.81);Pain Pain - Right/Left: Right Pain - part of body: Leg;Hip (buttock)     Time: 8897-8871 PT Time Calculation (min) (ACUTE ONLY): 26 min  Charges:    $Gait Training: 8-22 mins PT General Charges $$ ACUTE PT VISIT: 1 Visit                     Jody Wilson, PT Acute Rehab    Jody Wilson Jody Wilson 11/05/2024, 1:18 PM

## 2024-11-05 NOTE — Telephone Encounter (Signed)
   This patient is appearing on a report for being at risk of failing the adherence measure for hypertension (ACEi/ARB) medications this calendar year.   Medication: olmesartan  Last fill date: 11/05/24 for 90 day supply  Insurance report was not up to date. No action needed at this time.    [place] Dattero Donalee Gaumond, PharmD, BCACP, CPP Clinical Pharmacist, [facility]

## 2024-11-05 NOTE — TOC Progression Note (Signed)
 Transition of Care [facility]) - Progression Note    Patient Details  Name: Jody Wilson MRN: [id number] Date of Birth: 11-02-1951  Transition of Care [facility]) CM/SW Contact  Heather DELENA Saltness, LCSW Phone Number: 11/05/2024, 9:52 AM  Clinical Narrative:    Pt's insurance authorization for SNF rehab at [facility] has been approved. Auth ID: [id number]. CSW notified Allean at [facility]. TOC will await follow up.   Expected Discharge Plan: Skilled Nursing Facility Barriers to Discharge: SNF Pending bed offer   Expected Discharge Plan and Services In-house Referral: Clinical Social Work Discharge Planning Services: NA Post Acute Care Choice: NA Living arrangements for the past 2 months: Single Family Home Expected Discharge Date: 10/31/24               DME Arranged: N/A DME Agency: NA       HH Arranged: NA HH Agency: NA         Social Drivers of Health (SDOH) Interventions SDOH Screenings   Food Insecurity: No Food Insecurity (10/29/2024)  Housing: Low Risk  (10/29/2024)  Transportation Needs: No Transportation Needs (10/29/2024)  Utilities: Not At Risk (10/29/2024)  Alcohol Screen: Low Risk  (12/27/2023)  Depression (PHQ2-9): Low Risk  (07/24/2024)  Recent Concern: Depression (PHQ2-9) - High Risk (07/06/2024)  Financial Resource Strain: Low Risk  (12/27/2023)  Physical Activity: Inactive (12/27/2023)  Social Connections: Patient Declined (10/29/2024)  Recent Concern: Social Connections - Moderately Isolated (10/16/2024)  Stress: No Stress Concern Present (12/27/2023)  Tobacco Use: Medium Risk (10/29/2024)  Health Literacy: Adequate Health Literacy (12/27/2023)    Readmission Risk Interventions    10/22/2024    2:33 PM 04/13/2024    9:01 AM  Readmission Risk Prevention Plan  Medication Screening  Complete  Transportation Screening Complete Complete  HRI or Home Care Consult Complete   Social Work Consult for Recovery Care Planning/Counseling Complete   Palliative Care  Screening Not Applicable   Medication Review [employer]) Complete     Signed: Heather Saltness, MSW, LCSW Clinical Social Worker Inpatient Care Management 11/05/2024 9:53 AM

## 2024-11-05 NOTE — Discharge Summary (Signed)
 Physician Discharge Summary   Patient: Jody Wilson MRN: [id number] DOB: 1951-07-05  Admit date:     10/29/2024  Discharge date: 11/05/24  Discharge Physician: Owen DELENA Lore   PCP: Dettinger, Fonda DELENA, MD   Recommendations at discharge:  Use tylenol  and or Ibuprofen for pain management.  Avoid Opioids.  Needs to follow up with Ortho in 1--2 weeks.   Discharge Diagnoses: Principal Problem:   Toxic encephalopathy Active Problems:   Anxiety   Closed fracture of right inferior pubic ramus (HCC)   Visual hallucination   Polypharmacy  Resolved Problems:   * No resolved hospital problems. *  Hospital Course: 73 year old woman with a recent closed fracture right humerus, vertebral and pubic fractures in October, sent from nursing home for evaluation of altered mental status with hallucinations. This was also seen on previous admission was thought to be secondary to polypharmacy, particularly oxycodone . Oxycodone  was stopped on that hospitalization, patient's mentation improved and was discharged. However at the nursing home she was started on oxycodone  for uncontrolled pain, started to hallucinate and was sent to the emergency department. Admitted for acute toxic encephalopathy.    Assessment and Plan: 1-Acute toxic encephalopathy Visual  hallucinations Polypharmacy -Patient presents with hallucination and confusion, in the setting of polypharmacy: Oxycodone  and tramadol  and Flexeril  discontinued. Is back to baseline. -She has been tolerating Gabapentin, without confusion.   Subacute fracture of right L4, bilateral L5 transverse process Acute bilateral sacral fractures History Right humerus fracture. Repeated X ray which was evaluated by ortho Dr Beverley, who recommend continue NWB shoulder and use of Sling.  - Continue follow-up with orthopedic as an outpatient - Tylenol  for pain, gabapentin. Ibuprofen.  -Discussed CT with neurosurgery, she can wear brace. Pain management.    Anxiety depression Continue Celexa  and Wellbutrin  On Xanax  as needed, will recommend to wean this medication over time  Microscopic hematuria.  Needs follow up out patient.  Repeat UA out patient.  Holding lovenox , and Toradol .    HTN;  BP elevated. Continue coreg .  Started  low dose amlodipine.       Consultants: None Procedures performed: none Disposition: Skilled nursing facility Diet recommendation:  Discharge Diet Orders (From admission, onward)     Start     Ordered   10/31/24 0000  Diet - low sodium heart healthy        10/31/24 1018           Cardiac diet DISCHARGE MEDICATION: Allergies as of 11/05/2024       Reactions   Oxycodone  Other (See Comments)   Encephalopathy and hallucinations   Tetracyclines & Related Itching, Swelling        Medication List     STOP taking these medications    cyclobenzaprine  10 MG tablet Commonly known as: FLEXERIL    gabapentin 100 MG tablet Commonly known as: NEURONTIN Replaced by: gabapentin 100 MG capsule   haloperidol lactate 5 MG/ML injection Commonly known as: HALDOL   oxyCODONE  5 MG immediate release tablet Commonly known as: Oxy IR/ROXICODONE    traMADol  50 MG tablet Commonly known as: ULTRAM        TAKE these medications    acetaminophen  500 MG tablet Commonly known as: TYLENOL  Take 2 tablets (1,000 mg total) by mouth 3 (three) times daily.   ALPRAZolam  0.25 MG tablet Commonly known as: XANAX  Take 1 tablet (0.25 mg total) by mouth 2 (two) times daily as needed for anxiety.   amLODipine 2.5 MG tablet Commonly known as: NORVASC Take 1 tablet (  2.5 mg total) by mouth daily. Start taking on: November 06, 2024   aspirin  EC 81 MG tablet TAKE 1 PO BID THRU 05/13/24 THEN RESUME 1 PO DAILY What changed:  how much to take how to take this when to take this additional instructions   buPROPion  150 MG 24 hr tablet Commonly known as: Wellbutrin  XL Take 1 tablet (150 mg total) by mouth daily.    carvedilol  12.5 MG tablet Commonly known as: COREG  Take 1 tablet (12.5 mg total) by mouth 2 (two) times daily with a meal.   citalopram  40 MG tablet Commonly known as: CELEXA  Take 1 tablet (40 mg total) by mouth daily.   cyanocobalamin  500 MCG tablet Commonly known as: VITAMIN B12 Take 1 tablet (500 mcg total) by mouth daily. Start taking on: November 06, 2024   docusate sodium  100 MG capsule Commonly known as: COLACE Take 1 capsule (100 mg total) by mouth 2 (two) times daily.   gabapentin 100 MG capsule Commonly known as: NEURONTIN Take 2 capsules (200 mg total) by mouth 2 (two) times daily. Replaces: gabapentin 100 MG tablet   ibuprofen 200 MG tablet Commonly known as: ADVIL Take 1 tablet (200 mg total) by mouth every 6 (six) hours as needed for moderate pain (pain score 4-6).   lidocaine  5 % Commonly known as: LIDODERM  Place 1 patch onto the skin daily. Remove & Discard patch within 12 hours or as directed by MD   ondansetron  4 MG tablet Commonly known as: ZOFRAN  Take 1 tablet (4 mg total) by mouth every 8 (eight) hours as needed for nausea or vomiting.   pantoprazole  40 MG tablet Commonly known as: PROTONIX  Take 1 tablet (40 mg total) by mouth daily. Start taking on: November 06, 2024   polyethylene glycol 17 g packet Commonly known as: MIRALAX  / GLYCOLAX  Take 17 g by mouth daily as needed for mild constipation.   pravastatin  40 MG tablet Commonly known as: PRAVACHOL  Take 1 tablet (40 mg total) by mouth every evening. What changed: when to take this        Contact information for follow-up providers     Dettinger, Fonda LABOR, MD Follow up in 1 week(s).   Specialties: Family Medicine, Cardiology Contact information: [address] [place] [address] [phone]              Contact information for after-discharge care     Destination     [facility] .   Service: Skilled Nursing Contact  information: [address] [place]  [address] [phone]                    Discharge Exam: Jody Wilson   10/29/24 1515  Weight: 55.8 kg   General; NAD  Condition at discharge: stable  The results of significant diagnostics from this hospitalization (including imaging, microbiology, ancillary and laboratory) are listed below for reference.   Imaging Studies: DG Humerus Right Result Date: 10/31/2024 EXAM: VIEW(S) XRAY OF THE RIGHT HUMERUS 10/31/2024 01:00:00 PM COMPARISON: 10/03/2024 CLINICAL HISTORY: [id number] Humerus fracture [id number] Humerus fracture [id number] FINDINGS: BONES AND JOINTS: Continue presence of mildly displaced fracture involving proximal right humeral head and neck. Some callus formation is noted. Old distal right clavicular fracture is again noted. No focal osseous lesion. No joint dislocation. SOFT TISSUES: The soft tissues are unremarkable. IMPRESSION: 1. Mildly displaced fracture of the proximal right humeral head and neck with callus formation, compatible with healing phase. 2. Remote  distal right clavicular fracture. Electronically signed by: Lynwood Seip MD 10/31/2024 03:11 PM EST RP Workstation: [id number]   CT Head Wo Contrast Result Date: 10/29/2024 EXAM: CT HEAD WITHOUT CONTRAST 10/29/2024 09:22:52 AM TECHNIQUE: CT of the head was performed without the administration of intravenous contrast. Automated exposure control, iterative reconstruction, and/or weight based adjustment of the mA/kV was utilized to reduce the radiation dose to as low as reasonably achievable. COMPARISON: MRI brain 10/18/2024 and CT head 10/16/2024. CLINICAL HISTORY: Head trauma, minor (Age >= 65y). FINDINGS: LIMITATIONS/ARTIFACTS: Exam is mildly degraded by motion artifact. BRAIN AND VENTRICLES: No acute intracranial hemorrhage. Scattered white matter hypodensities which are nonspecific but most commonly represent chronic microvascular ischemic changes. No evidence of acute  infarct. No hydrocephalus. No extra-axial collection. No mass effect or midline shift. ORBITS: No acute abnormality. SINUSES: Partial opacification of the left sphenoid sinus. SOFT TISSUES AND SKULL: No acute soft tissue abnormality. No calvarial fracture. IMPRESSION: 1. No acute intracranial hemorrhage or calvarial fracture. Electronically signed by: prentice bybordi 10/29/2024 10:00 AM EST RP Workstation: [id number]   CT Lumbar Spine Wo Contrast Result Date: 10/29/2024 EXAM: CT OF THE LUMBAR SPINE WITHOUT CONTRAST 10/29/2024 09:22:52 AM TECHNIQUE: CT of the lumbar spine was performed without the administration of intravenous contrast. Multiplanar reformatted images are provided for review. Automated exposure control, iterative reconstruction, and/or weight based adjustment of the mA/kV was utilized to reduce the radiation dose to as low as reasonably achievable. COMPARISON: CT of the pelvis 10/16/2024 and 10/03/2024. CLINICAL HISTORY: Back trauma, no prior imaging (Age >= 16y). FINDINGS: BONES AND ALIGNMENT: There are 5 lumbar vertebral bodies. The alignment is stable with a mild left convex scoliosis. There is no focal angulation or listhesis. No acute vertebral body fractures are identified. However, there are subacute fractures of the right L4 and bilateral L5 transverse processes, similar to previous examination. There is progressive displacement of subacute fractures of the sacral ala bilaterally, incompletely visualized . No widening of the visualized sacroiliac joints is demonstrated. DEGENERATIVE CHANGES: Multilevel spondylosis with advanced disc space narrowing and endplate osteophytes greatest at L2-L3 and L4-L5. L1-L2: Chronic loss of disc height with vacuum disc phenomenon and disc bulging eccentric to the left. No significant spinal stenosis or foraminal narrowing. L2-L3: Chronic loss of disc height with annular disc bulging and endplate osteophytes asymmetric to the right. Mild facet and  ligamentous hypertrophy. Resulting mild spinal stenosis with mild right foraminal narrowing. L3-L4: Preserved disc height at this level with mild disc bulging. No significant spinal stenosis or foraminal narrowing. L4-L5: Chronic loss of disc height with annular disc bulging and endplate osteophytes asymmetric to the left. Moderate facet and ligamentous hypertrophy. Mild spinal stenosis with mild to moderate foraminal narrowing bilaterally. L5-S1: Preserved disc height with mild disc bulging and mild bilateral facet hypertrophy. No significant spinal stenosis or foraminal narrowing. SOFT TISSUES: Aortic and branch vessel atherosclerosis. No evidence of periarticular hematoma. IMPRESSION: 1. Subacute fractures of the right L4 and bilateral L5 transverse processes, with progressive displacement compared to prior examinations. No evidence of vertebral body fracture. 2. Progressive displacement of previously demonstrated bilateral sacral fractures, incompletely visualized. 3. Multilevel spondylosis as detailed above. No high-grade spinal stenosis or high-grade foraminal narrowing identified. Electronically signed by: Elsie Perone MD 10/29/2024 09:53 AM EST RP Workstation: [id number]   DG Chest Port 1 View Result Date: 10/29/2024 CLINICAL DATA:  Concern for pneumonia. EXAM: PORTABLE CHEST 1 VIEW COMPARISON:  Chest radiograph dated 10/17/2024 FINDINGS: No focal consolidation, pleural effusion, pneumothorax. Stable cardiomegaly. Atherosclerotic calcification  of the aorta. Hiatal hernia. No acute osseous pathology. IMPRESSION: 1. No active disease. 2. Cardiomegaly. 3. Hiatal hernia. Electronically Signed   By: Vanetta Chou M.D.   On: 10/29/2024 09:34   EEG adult Result Date: 10/18/2024 Shelton Arlin KIDD, MD     10/18/2024  4:57 PM Patient Name: FERRIN LIEBIG MRN: [id number] Epilepsy Attending: Arlin KIDD Shelton Referring Physician/Provider: Will Almarie MATSU, MD Date: 10/18/2024 Duration: 22.43 mins Patient  history: 72yo f with ams. EEG to evaluate for seizure Level of alertness: Awake, asleep AEDs during EEG study: None Technical aspects: This EEG study was done with scalp electrodes positioned according to the 10-20 International system of electrode placement. Electrical activity was reviewed with band pass filter of 1-70Hz , sensitivity of 7 uV/mm, display speed of 38mm/sec with a 60Hz  notched filter applied as appropriate. EEG data were recorded continuously and digitally stored.  Video monitoring was available and reviewed as appropriate. Description: The posterior dominant rhythm consists of 8 Hz activity of moderate voltage (25-35 uV) seen predominantly in posterior head regions, symmetric and reactive to eye opening and eye closing. Sleep was characterized by vertex waves, sleep spindles (12 to 14 Hz), maximal frontocentral region. Physiologic photic driving was not seen during photic stimulation. Hyperventilation was not performed.   IMPRESSION: This study is within normal limits. No seizures or epileptiform discharges were seen throughout the recording. A normal interictal EEG does not exclude the diagnosis of epilepsy. Arlin KIDD Shelton   MR BRAIN W WO CONTRAST Result Date: 10/18/2024 EXAM: MRI BRAIN WITH AND WITHOUT CONTRAST 10/18/2024 12:24:58 PM TECHNIQUE: Multiplanar multisequence MRI of the head/brain was performed with and without the administration of 6 mL gadobutrol  (GADAVIST ) 1 MMOL/ML injection. COMPARISON: 09/07/2022 CLINICAL HISTORY: Mental status change, unknown cause. FINDINGS: BRAIN AND VENTRICLES: No acute infarct. No acute intracranial hemorrhage. No mass effect or midline shift. No hydrocephalus. The sella is unremarkable. Normal flow voids. No mass or abnormal enhancement. Multifocal hyperintense T2-weighted signal within the cerebral white matter, most commonly due to chronic small vessel disease. ORBITS: No acute abnormality. SINUSES: No acute abnormality. BONES AND SOFT TISSUES:  Normal bone marrow signal and enhancement. No acute soft tissue abnormality. IMPRESSION: 1. No acute intracranial abnormality. 2. Multifocal T2 hyperintense white matter signal most consistent with chronic small vessel disease. Electronically signed by: Franky Stanford MD 10/18/2024 12:46 PM EDT RP Workstation: [id number]   DG Chest 1 View Result Date: 10/17/2024 EXAM: 1 VIEW XRAY OF THE CHEST 10/17/2024 11:31:00 AM COMPARISON: None available. CLINICAL HISTORY: SOB (shortness of breath) [id number]. FINDINGS: LUNGS AND PLEURA: Chronic coarsened interstitial markings without pulmonary edema. No pleural effusion. No pneumothorax. HEART AND MEDIASTINUM: Atherosclerotic plaque. Large Hiatal hernia. 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 Cervical spine surgical hardware noted. IMPRESSION: 1. No acute cardiopulmonary findings. 2. Large hiatal hernia. Electronically signed by: Norleen Kil MD 10/17/2024 12:56 PM EDT RP Workstation: [id number]   CT CHEST ABDOMEN PELVIS W CONTRAST Result Date: 10/16/2024 CLINICAL DATA:  Sepsis. EXAM: CT CHEST, ABDOMEN, AND PELVIS WITH CONTRAST TECHNIQUE: Multidetector CT imaging of the chest, abdomen and pelvis was performed following the standard protocol during bolus administration of intravenous contrast. RADIATION DOSE REDUCTION: This exam was performed according to the departmental dose-optimization program which includes automated exposure control, adjustment of the mA and/or kV according to patient size and/or use of iterative reconstruction technique. CONTRAST:  OMNIPAQUE  IOHEXOL  300 MG/ML  SOLN COMPARISON:  Chest radiograph dated 10/16/2024 and pelvic CT dated 10/03/2024. FINDINGS: CT CHEST FINDINGS  Cardiovascular: There is no cardiomegaly or pericardial effusion. There is coronary vascular calcification. Moderate calcified and noncalcified plaque of the thoracic aorta. No aneurysmal dilatation or dissection. The origins of the great vessels of the  aortic arch and the central pulmonary arteries are patent. Mediastinum/Nodes: No hilar or mediastinal adenopathy. There is a large hiatal hernia containing the majority of the stomach and a segment of the transverse colon. The esophagus is grossly unremarkable. No mediastinal fluid collection. Lungs/Pleura: Background of emphysema. There is partial compressive atelectasis of the lower lobes secondary to a large hiatal hernia. Faint cluster of nodular density in the right upper lobe likely post inflammatory or atypical infection. No consolidative changes. There is no pleural effusion pneumothorax. The central airways are patent. Musculoskeletal: No acute osseous pathology. Osteopenia with degenerative changes of the spine. Old sternal fracture. T12 vertebroplasty. CT ABDOMEN PELVIS FINDINGS No intra-abdominal free air or free fluid. Hepatobiliary: The liver is unremarkable. There is mild dilatation, post cholecystectomy. No retained calcified stone noted in the central CBD. Pancreas: Unremarkable. No pancreatic ductal dilatation or surrounding inflammatory changes. Spleen: Normal in size without focal abnormality. Adrenals/Urinary Tract: The adrenal glands unremarkable. There is no hydronephrosis on either side. There is symmetric enhancement and excretion of contrast by both kidneys. The visualized ureters appear unremarkable the urinary bladder is poorly visualized due to streak artifact caused by bilateral total hip arthroplasties. Stomach/Bowel: There is no bowel obstruction or active inflammation. The appendix is normal. Vascular/Lymphatic: Advanced aortoiliac atherosclerotic disease. The IVC is unremarkable. No portal venous gas. There is no adenopathy. Reproductive: Evaluation of the pelvic structures is very limited due to streak artifact caused by bilateral hip arthroplasties. Other: None Musculoskeletal: Bilateral total hip arthroplasties. Displaced fractures of the right anterior and inferior pubic rami as  well as insufficiency fractures of bilateral sacral ala. There is osteopenia with degenerative changes of the spine. IMPRESSION: 1. A cluster of nodular density in the right upper lobe, likely post inflammatory versus atypical pneumonia. Clinical correlation is recommended. 2. Large hiatal hernia containing the majority of the stomach and a segment of the transverse colon. No bowel obstruction. Normal appendix. 3. Displaced fractures of the right anterior and inferior pubic rami as well as insufficiency fractures of bilateral sacral ala. 4.  Aortic Atherosclerosis (ICD10-I70.0). Electronically Signed   By: Vanetta Chou M.D.   On: 10/16/2024 19:35   CT Head Wo Contrast Result Date: 10/16/2024 EXAM: CT HEAD WITHOUT CONTRAST 10/16/2024 07:08:51 PM TECHNIQUE: CT of the head was performed without the administration of intravenous contrast. Automated exposure control, iterative reconstruction, and/or weight based adjustment of the mA/kV was utilized to reduce the radiation dose to as low as reasonably achievable. COMPARISON: 06/26/2024 CLINICAL HISTORY: Mental status change, unknown cause. Pt BIB [facility] from [facility] with reports of AMS since Saturday morning. Pt has been more confused than normal. Pt has had recent fractures, oriented to herself. Pt reports burning with urination. Right clavicle fx. FINDINGS: BRAIN AND VENTRICLES: No acute hemorrhage. No evidence of acute infarct. No hydrocephalus. No extra-axial collection. No mass effect or midline shift. Calcific atherosclerosis. ORBITS: No acute abnormality. SINUSES: Left sphenoid sinus mucosal thickening. SOFT TISSUES AND SKULL: No acute soft tissue abnormality. No skull fracture. LIMITATIONS/ARTIFACTS: Motion limited study. IMPRESSION: 1. Motion degraded exam. 2. No acute intracranial abnormality. 3. Chronic left sphenoid sinus disease. Electronically signed by: Morene Hoard MD 10/16/2024 07:31 PM EDT RP Workstation: [id number]   DG  Chest Port 1 View Result Date: 10/16/2024 EXAM: 1  VIEW(S) XRAY OF THE CHEST 10/16/2024 03:29:00 PM COMPARISON: 10/03/2024 CLINICAL HISTORY: [id number] AMS (altered mental status) [id number]. Per chart: Pt BIB [facility] from [facility] with reports of AMS since Saturday morning. Pt has been more confused than normal. Pt has had recent fractures, oriented to herself. Pt reports burning with urination. Right clavicle fx. 2L [place] at ; baseline. FINDINGS: LUNGS AND PLEURA: No focal pulmonary opacity. No pulmonary edema. No pleural effusion. No pneumothorax. HEART AND MEDIASTINUM: No acute abnormality of the cardiac and mediastinal silhouettes. Large hiatal hernia is again noted. BONES AND SOFT TISSUES: No acute osseous abnormality. IMPRESSION: 1. No acute cardiopulmonary abnormality. 2. Large hiatal hernia. Electronically signed by: Lynwood Seip MD 10/16/2024 03:51 PM EDT RP Workstation: [id number]    Microbiology: Results for orders placed or performed during the hospital encounter of 10/29/24  Resp panel by RT-PCR (RSV, Flu A&B, Covid) Anterior Nasal Swab     Status: None   Collection Time: 10/29/24  8:33 AM   Specimen: Anterior Nasal Swab  Result Value Ref Range Status   SARS Coronavirus 2 by RT PCR NEGATIVE NEGATIVE Final    Comment: (NOTE) SARS-CoV-2 target nucleic acids are NOT DETECTED.  The SARS-CoV-2 RNA is generally detectable in upper respiratory specimens during the acute phase of infection. The lowest concentration of SARS-CoV-2 viral copies this assay can detect is 138 copies/mL. A negative result does not preclude SARS-Cov-2 infection and should not be used as the sole basis for treatment or other patient management decisions. A negative result may occur with  improper specimen collection/handling, submission of specimen other than nasopharyngeal swab, presence of viral mutation(s) within the areas targeted by this assay, and inadequate number of viral copies(<138 copies/mL). A  negative result must be combined with clinical observations, patient history, and epidemiological information. The expected result is Negative.  Fact Sheet for Patients:  bloggercourse.com  Fact Sheet for Healthcare Providers:  seriousbroker.it  This test is no t yet approved or cleared by the [place]  FDA and  has been authorized for detection and/or diagnosis of SARS-CoV-2 by FDA under an Emergency Use Authorization (EUA). This EUA will remain  in effect (meaning this test can be used) for the duration of the COVID-19 declaration under Section 564(b)(1) of the Act, 21 U.S.C.section 360bbb-3(b)(1), unless the authorization is terminated  or revoked sooner.       Influenza A by PCR NEGATIVE NEGATIVE Final   Influenza B by PCR NEGATIVE NEGATIVE Final    Comment: (NOTE) The Xpert Xpress SARS-CoV-2/FLU/RSV plus assay is intended as an aid in the diagnosis of influenza from Nasopharyngeal swab specimens and should not be used as a sole basis for treatment. Nasal washings and aspirates are unacceptable for Xpert Xpress SARS-CoV-2/FLU/RSV testing.  Fact Sheet for Patients: bloggercourse.com  Fact Sheet for Healthcare Providers: seriousbroker.it  This test is not yet approved or cleared by the [place]  FDA and has been authorized for detection and/or diagnosis of SARS-CoV-2 by FDA under an Emergency Use Authorization (EUA). This EUA will remain in effect (meaning this test can be used) for the duration of the COVID-19 declaration under Section 564(b)(1) of the Act, 21 U.S.C. section 360bbb-3(b)(1), unless the authorization is terminated or revoked.     Resp Syncytial Virus by PCR NEGATIVE NEGATIVE Final    Comment: (NOTE) Fact Sheet for Patients: bloggercourse.com  Fact Sheet for Healthcare  Providers: seriousbroker.it  This test is not yet approved or cleared by the [place]  FDA and has been authorized  for detection and/or diagnosis of SARS-CoV-2 by FDA under an Emergency Use Authorization (EUA). This EUA will remain in effect (meaning this test can be used) for the duration of the COVID-19 declaration under Section 564(b)(1) of the Act, 21 U.S.C. section 360bbb-3(b)(1), unless the authorization is terminated or revoked.  Performed at [facility], [address] [place], [place] [address]     Labs: CBC: Recent Labs  Lab 11/04/24 0607  WBC 4.5  HGB 14.0  HCT 42.2  MCV 101.4*  PLT 238   Basic Metabolic Panel: Recent Labs  Lab 11/02/24 0941  NA 139  K 4.1  CL 105  CO2 26  GLUCOSE 97  BUN 22  CREATININE 0.67  CALCIUM  9.4   Liver Function Tests: No results for input(s): AST, ALT, ALKPHOS, BILITOT, PROT, ALBUMIN in the last 168 hours. CBG: No results for input(s): GLUCAP in the last 168 hours.  Discharge time spent: greater than 30 minutes.  Signed: Owen DELENA Lore, MD [facility] Hospitalists 11/05/2024

## 2024-11-07 ENCOUNTER — Other Ambulatory Visit: Payer: Self-pay | Admitting: *Deleted

## 2024-11-07 DIAGNOSIS — Z122 Encounter for screening for malignant neoplasm of respiratory organs: Secondary | ICD-10-CM

## 2024-11-07 DIAGNOSIS — Z87891 Personal history of nicotine dependence: Secondary | ICD-10-CM

## 2024-11-07 DIAGNOSIS — F17210 Nicotine dependence, cigarettes, uncomplicated: Secondary | ICD-10-CM

## 2024-11-17 ENCOUNTER — Emergency Department (HOSPITAL_COMMUNITY)

## 2024-11-17 ENCOUNTER — Encounter (HOSPITAL_COMMUNITY): Payer: Self-pay | Admitting: Emergency Medicine

## 2024-11-17 ENCOUNTER — Emergency Department (HOSPITAL_COMMUNITY)
Admission: EM | Admit: 2024-11-17 | Discharge: 2024-11-17 | Disposition: A | Discharge status: Nursing Home (Includes ICF) | Attending: Emergency Medicine | Admitting: Emergency Medicine

## 2024-11-17 ENCOUNTER — Other Ambulatory Visit: Payer: Self-pay

## 2024-11-17 DIAGNOSIS — G259 Extrapyramidal and movement disorder, unspecified: Secondary | ICD-10-CM | POA: Diagnosis not present

## 2024-11-17 DIAGNOSIS — Z79899 Other long term (current) drug therapy: Secondary | ICD-10-CM | POA: Insufficient documentation

## 2024-11-17 DIAGNOSIS — R110 Nausea: Secondary | ICD-10-CM | POA: Insufficient documentation

## 2024-11-17 DIAGNOSIS — Z7982 Long term (current) use of aspirin: Secondary | ICD-10-CM | POA: Diagnosis not present

## 2024-11-17 DIAGNOSIS — R251 Tremor, unspecified: Secondary | ICD-10-CM | POA: Diagnosis present

## 2024-11-17 LAB — URINALYSIS, ROUTINE W REFLEX MICROSCOPIC
Bilirubin Urine: NEGATIVE
Glucose, UA: NEGATIVE mg/dL
Hgb urine dipstick: NEGATIVE
Ketones, ur: NEGATIVE mg/dL
Leukocytes,Ua: NEGATIVE
Nitrite: NEGATIVE
Protein, ur: NEGATIVE mg/dL
Specific Gravity, Urine: 1.015 (ref 1.005–1.030)
pH: 7.5 (ref 5.0–8.0)

## 2024-11-17 LAB — CBC
HCT: 41.4 % (ref 36.0–46.0)
Hemoglobin: 13.7 g/dL (ref 12.0–15.0)
MCH: 33.8 pg (ref 26.0–34.0)
MCHC: 33.1 g/dL (ref 30.0–36.0)
MCV: 102.2 fL — ABNORMAL HIGH (ref 80.0–100.0)
Platelets: 343 K/uL (ref 150–400)
RBC: 4.05 MIL/uL (ref 3.87–5.11)
RDW: 12.9 % (ref 11.5–15.5)
WBC: 7.2 K/uL (ref 4.0–10.5)
nRBC: 0 % (ref 0.0–0.2)

## 2024-11-17 LAB — BASIC METABOLIC PANEL WITH GFR
Anion gap: 11 (ref 5–15)
BUN: 16 mg/dL (ref 8–23)
CO2: 24 mmol/L (ref 22–32)
Calcium: 9.2 mg/dL (ref 8.9–10.3)
Chloride: 103 mmol/L (ref 98–111)
Creatinine, Ser: 0.65 mg/dL (ref 0.44–1.00)
GFR, Estimated: >60 mL/min (ref >60)
Glucose, Bld: 124 mg/dL — ABNORMAL HIGH (ref 70–99)
Potassium: 4.3 mmol/L (ref 3.5–5.1)
Sodium: 138 mmol/L (ref 135–145)

## 2024-11-17 LAB — TROPONIN I (HIGH SENSITIVITY)
Troponin I (High Sensitivity): 3 ng/L (ref <18)
Troponin I (High Sensitivity): 3 ng/L (ref <18)

## 2024-11-17 MED ORDER — SODIUM CHLORIDE 0.9 % IV BOLUS
500.0000 mL | Freq: Once | INTRAVENOUS | Status: AC
  Administered 2024-11-17: 500 mL via INTRAVENOUS

## 2024-11-17 MED ORDER — DIPHENHYDRAMINE HCL 50 MG/ML IJ SOLN
12.5000 mg | Freq: Once | INTRAMUSCULAR | Status: AC
  Administered 2024-11-17: 12.5 mg via INTRAVENOUS
  Filled 2024-11-17: qty 1

## 2024-11-17 NOTE — ED Notes (Signed)
 PTARR Called 3rd in line

## 2024-11-17 NOTE — ED Provider Notes (Signed)
 [place] [facility]ERGENCY DEPARTMENT AT [facility] Provider Note   CSN: [id number] Arrival date & time: 11/17/24  1357     Patient presents with: tremors, Chest Pain, and Nausea   Jody Wilson is a 73 y.o. female.   Patient to ED for evaluation of sudden, involuntary spastic movements of the arms and legs that started yesterday. No prolonged movements or seizure activity. She is aware and awake at the time of movements. She states she has had this in the past and she associates it with being under stress. Her husband is currently in ICU s/p surgery and she is in a rehab facility recovering from multiple fractures. No fever, vomiting, SOB.   The history is provided by the patient and a relative. No language interpreter was used.  Chest Pain      Prior to Admission medications   Medication Sig Start Date End Date Taking? Authorizing Provider  acetaminophen  (TYLENOL ) 500 MG tablet Take 2 tablets (1,000 mg total) by mouth 3 (three) times daily. 10/31/24   Regalado, Belkys A, MD  ALPRAZolam  (XANAX ) 0.25 MG tablet Take 1 tablet (0.25 mg total) by mouth 2 (two) times daily as needed for anxiety. 11/05/24   Regalado, Belkys A, MD  amLODipine  (NORVASC ) 2.5 MG tablet Take 1 tablet (2.5 mg total) by mouth daily. 11/06/24   Regalado, Owen A, MD  aspirin  EC 81 MG tablet TAKE 1 PO BID THRU 05/13/24 THEN RESUME 1 PO DAILY Patient taking differently: Take 81 mg by mouth daily. 04/16/24   Johnson, Clanford L, MD  buPROPion  (WELLBUTRIN  XL) 150 MG 24 hr tablet Take 1 tablet (150 mg total) by mouth daily. 11/05/24   Regalado, Owen A, MD  carvedilol  (COREG ) 12.5 MG tablet Take 1 tablet (12.5 mg total) by mouth 2 (two) times daily with a meal. 10/22/24   Will Almarie MATSU, MD  citalopram  (CELEXA ) 40 MG tablet Take 1 tablet (40 mg total) by mouth daily. 11/05/24   Regalado, Belkys A, MD  cyanocobalamin  (VITAMIN B12) 500 MCG tablet Take 1 tablet (500 mcg total) by mouth daily. 11/06/24 12/06/24   Regalado, Belkys A, MD  docusate sodium  (COLACE) 100 MG capsule Take 1 capsule (100 mg total) by mouth 2 (two) times daily. 04/16/24   Johnson, Clanford L, MD  gabapentin  (NEURONTIN ) 100 MG capsule Take 2 capsules (200 mg total) by mouth 2 (two) times daily. 11/05/24 12/05/24  Regalado, Belkys A, MD  ibuprofen  (ADVIL ) 200 MG tablet Take 1 tablet (200 mg total) by mouth every 6 (six) hours as needed for moderate pain (pain score 4-6). 11/05/24   Regalado, Belkys A, MD  lidocaine  (LIDODERM ) 5 % Place 1 patch onto the skin daily. Remove & Discard patch within 12 hours or as directed by MD 11/05/24   Regalado, Belkys A, MD  ondansetron  (ZOFRAN ) 4 MG tablet Take 1 tablet (4 mg total) by mouth every 8 (eight) hours as needed for nausea or vomiting. 05/08/24   Francesca Elsie CROME, MD  pantoprazole  (PROTONIX ) 40 MG tablet Take 1 tablet (40 mg total) by mouth daily. 11/06/24   Regalado, Belkys A, MD  polyethylene glycol (MIRALAX  / GLYCOLAX ) 17 g packet Take 17 g by mouth daily as needed for mild constipation. 04/16/24   Johnson, Clanford L, MD  pravastatin  (PRAVACHOL ) 40 MG tablet Take 1 tablet (40 mg total) by mouth every evening. Patient taking differently: Take 40 mg by mouth daily. 06/04/24   Dettinger, Fonda LABOR, MD    Allergies: Oxycodone  and  Tetracyclines & related    Review of Systems  Cardiovascular:  Positive for chest pain.    Updated Vital Signs BP (!) 121/58 (BP Location: Left Arm)   Pulse 76   Temp 97.6 F (36.4 C)   Resp 17   Ht 5' 5 (1.651 m)   Wt 58.1 kg   SpO2 95%   BMI 21.30 kg/m   Physical Exam  (all labs ordered are listed, but only abnormal results are displayed) Labs Reviewed  BASIC METABOLIC PANEL WITH GFR - Abnormal; Notable for the following components:      Result Value   Glucose, Bld 124 (*)    All other components within normal limits  CBC - Abnormal; Notable for the following components:   MCV 102.2 (*)    All other components within normal limits   URINALYSIS, ROUTINE W REFLEX MICROSCOPIC  TROPONIN I (HIGH SENSITIVITY)  TROPONIN I (HIGH SENSITIVITY)    EKG: EKG Interpretation Date/Time:  Saturday November 17 2024 14:15:53 EST Ventricular Rate:  70 PR Interval:  111 QRS Duration:  79 QT Interval:  388 QTC Calculation: 419 R Axis:   59  Text Interpretation: Sinus rhythm Borderline short PR interval Confirmed by Cottie Cough [phone]) on 11/17/2024 4:05:26 PM  Radiology: [facility] Chest Portable 1 View Result Date: 11/17/2024 CLINICAL DATA:  Chest pain EXAM: PORTABLE CHEST 1 VIEW COMPARISON:  Chest x-ray 10/29/2024 FINDINGS: The heart size and mediastinal contours are within normal limits. Both lungs are clear. Large hiatal hernia is again seen. Cervical spinal fusion plate is present. The visualized skeletal structures are otherwise unremarkable. IMPRESSION: No active disease. Large hiatal hernia. Electronically Signed   By: Greig Pique M.D.   On: 11/17/2024 15:27     Procedures   Medications Ordered in the ED  sodium chloride  0.9 % bolus 500 mL (0 mLs Intravenous Stopped 11/17/24 1930)  diphenhydrAMINE  (BENADRYL ) injection 12.5 mg (12.5 mg Intravenous Given 11/17/24 1929)    Clinical Course as of 11/17/24 2011  Sat Nov 17, 2024  2007 Patient with chronic recurrent symptoms of an involuntary jerking movement that she has had for years, worse recently. Her labs are reassuring. No evidence to suggest acute neurologic event, or infection. She states benadryl  has helped in the past including given by EMS on the way here tonight. Discussed importance of follow up with PCP for ongoing management. Discussed with dr. Cottie. She is felt appropriate for discharge home. PTAR to return here to facility due to back compression fractures, does not have her brace.  [SU]    Clinical Course User Index [SU] Odell Balls, PA-C                                 Medical Decision Making Amount and/or Complexity of Data Reviewed Labs:  ordered. Radiology: ordered.  Risk Prescription drug management.        Final diagnoses:  Movement disorder    ED Discharge Orders     None          Odell Balls RIGGERS 11/17/24 2011    Cottie Cough PARAS, MD 11/17/24 2045

## 2024-11-17 NOTE — Discharge Instructions (Signed)
 As we discussed, you can take Benadryl  25 mg every 6 hours if this helps alleviate your symptoms of jerking movements.   Please plan to follow up with your doctor for further outpatient evaluation of ongoing symptoms.

## 2024-11-17 NOTE — ED Triage Notes (Addendum)
 PT BIB [facility] from [facility] for tremors x 2 days with hx of the same.  Has been tx as APS with Benadryl  in the past.  EMS tx with benadryl  and tremors have resolved.   Also complains of nausea and L. Side dull 7/10 CP radiating to flank. Holding breath makes it worse.  Similar to panic attack symptoms but not resolving like normal.   Pt is in a sling. ENdorses right humerus and pelvic fx and compression fx in back from a fall at [facility] on 10/14 while taking husband to hospital.  Husband is in ICU right now so she is anxious about that.  EMS gave 243 of ASA, 0.4 nitroglycerin and 4 mg of Zofran .  Also 50 mg of Benadryl .   18G L. AC   CBG 104 Slightly HTN.  Other VSS

## 2024-11-17 NOTE — ED Notes (Signed)
 Inadvertently completed 2 view chest xray.  It was not actually performed.  Portable chest ordered in its place

## 2024-11-19 ENCOUNTER — Ambulatory Visit: Admitting: Orthopedic Surgery

## 2024-11-19 ENCOUNTER — Encounter: Payer: Self-pay | Admitting: Orthopedic Surgery

## 2024-11-19 ENCOUNTER — Other Ambulatory Visit: Payer: Self-pay

## 2024-11-19 DIAGNOSIS — S32591A Other specified fracture of right pubis, initial encounter for closed fracture: Secondary | ICD-10-CM | POA: Diagnosis not present

## 2024-11-19 DIAGNOSIS — S42294A Other nondisplaced fracture of upper end of right humerus, initial encounter for closed fracture: Secondary | ICD-10-CM

## 2024-11-19 DIAGNOSIS — S3210XA Unspecified fracture of sacrum, initial encounter for closed fracture: Secondary | ICD-10-CM | POA: Diagnosis not present

## 2024-11-19 DIAGNOSIS — S32009A Unspecified fracture of unspecified lumbar vertebra, initial encounter for closed fracture: Secondary | ICD-10-CM

## 2024-11-19 NOTE — Progress Notes (Addendum)
 Chief Complaint  Patient presents with   Injury    Fall October 15 Proximal humerus fracture Sacral fracture Anzures process fracture lumbar    73 year old female status post bilateral hip fractures with sequential bipolar replacements the most recent 1 June 27, 2024  The patient was at [facility] with her husband who needed heart surgery and fell and fractured her right proximal humerus, L4 transverse process, L5 transverse process bilateral, bilateral sacral fractures, right inferior pubic ramus fracture right superior pubic ramus fracture  She is currently at [facility].  She was at another rehab facility but she had 3 episodes of opioid related confusion had to be readmitted to the hospital so she was moved to [facility]  She was seen by orthopedics at [facility] they recommended she see her private orthopedist  Show she is here for follow-up with the injuries as stated  Repeat imaging of the right shoulder DG Shoulder Right Result Date: 11/19/2024 Fracture care follow-up proximal humerus fracture right shoulder Callus is seen around the proximal humerus fracture which was noted several weeks before.  It is minimally displaced with slight angulation.  The patient is noted to have a previous cervical fusion Impression healed right proximal humerus fracture     Review of imaging, the most significant and related.  I agree with the reports as dictated,  The superior and inferior pubic rami fractures can be treated nonoperatively as it does not meet the criteria for surgical intervention  The proximal humerus can be treated nonoperatively as it is minimally displaced  The transverse process fractures in the lumbar spine can be treated nonoperatively as they are minimally displaced  The sacral fractures can be treated nonoperatively as they do not meet criteria for surgical intervention  CT scan October 03, 2024 IMPRESSION: 1. Acute fracture of the right inferior pubic  ramus with full shaft width of displacement in the transverse plane measuring 6 mm. 2. Acute mildly displaced fracture of the right superior pubic ramus. 3. Nondisplaced fracture of the right sacral ala with fracture margins extending to the right sacroiliac joint. No evidence of significant diastasis. 4. Bilateral hip arthroplasties demonstrate normal alignment. Hardware is intact. 5. Soft tissue and intramuscular edema with hemorrhage surrounding the right superior and inferior pubic rami fractures.  CT scan lumbar October 29, 2024 IMPRESSION: 1. Subacute fractures of the right L4 and bilateral L5 transverse processes, with progressive displacement compared to prior examinations. No evidence of vertebral body fracture. 2. Progressive displacement of previously demonstrated bilateral sacral fractures, incompletely visualized. 3. Multilevel spondylosis as detailed above. No high-grade spinal stenosis or high-grade foraminal narrowing identified.   Electronically signed by: Elsie Perone MD 10/29/2024 09:53 AM EST RP Workstation: [id number]  CT scan October 15 shoulder  IMPRESSION: 1. Acute comminuted fracture of the greater tuberosity of the right proximal humerus with a minimally distracted cortical fracture fragment along the posterior margin of the humeral head. No dislocation. 2. 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     Electronically Signed   By: Harrietta Sherry M.D.   On: 10/03/2024 19:02  I have also reviewed the following images and the corresponding reports:  October 15 right shoulder  October 15 right humerus  October 15 pelvis and right hip  October 15 CT shoulder right  October 15 CT pelvis  October 10 CT lumbar,  November 12 right humerus  Encounter Diagnoses  Name Primary?   Other closed nondisplaced fracture of proximal end of right humerus, initial  encounter Yes   Closed fracture of transverse process of lumbar vertebra, initial  encounter (HCC) right L4, bilateral L5    Closed fracture of ramus of right pubis, initial encounter (HCC)    Closed fracture of sacrum, unspecified portion of sacrum, initial encounter (HCC)       Assessment and Plan:   L4 transverse process fracture Bilateral L5 transverse process fracture Bilateral sacral fractures Right superior and inferior pubic ramus fracture Right proximal humerus fracture  The patient is in a brace for her back that can be removed  The sling can be removed and she can start physical therapy  She can start weightbearing as tolerated with the hemiwalker that she is using   Note she also has significant and severe degenerative disc disease in her lumbar spine  Recheck 4 weeks no x-rays needed  Recommend ibuprofen  800 mg 3 times a day along with Tylenol  for pain

## 2024-11-19 NOTE — Progress Notes (Signed)
    11/19/2024   Chief Complaint  Patient presents with   Post-op Follow-up    Left hip 06/27/24    No diagnosis found.  What pharmacy do you use ? ________[facility] ___________________  DOI/DOS/ Date: 06/27/24  Did you get better, worse or no change (Answer below)   Unchanged Hard to tell how hip is feeling/ had new injury / fell when at [facility] hospital with her husband 10/03/24 pelvic fractures humerus fractures and ? Vertebral fractures

## 2024-11-20 ENCOUNTER — Telehealth: Payer: Self-pay | Admitting: Orthopedic Surgery

## 2024-11-20 NOTE — Telephone Encounter (Signed)
 WBAT I called [facility] to advise.

## 2024-11-20 NOTE — Telephone Encounter (Signed)
 Dr. Areatha pt - spoke w/Eveyln PT at [facility] [phone], she needs weight bearing status for the right upper extremity.  She said to call back and ask for PT.

## 2024-11-29 ENCOUNTER — Ambulatory Visit: Admitting: Physical Therapy

## 2024-12-03 ENCOUNTER — Telehealth: Payer: Self-pay | Admitting: Orthopedic Surgery

## 2024-12-03 DIAGNOSIS — S32009A Unspecified fracture of unspecified lumbar vertebra, initial encounter for closed fracture: Secondary | ICD-10-CM

## 2024-12-03 DIAGNOSIS — S42294A Other nondisplaced fracture of upper end of right humerus, initial encounter for closed fracture: Secondary | ICD-10-CM

## 2024-12-03 DIAGNOSIS — S32591A Other specified fracture of right pubis, initial encounter for closed fracture: Secondary | ICD-10-CM

## 2024-12-03 NOTE — Telephone Encounter (Signed)
 Dr. Areatha pt - Heather w/[facility] in [place] [phone] ext [phone] or just put the referral in, lvm stating they wanted to see if they can get a referral sent over.  She originally had an order from [facility], but only for working on the pubis area.  They want to see if they can get a referral for this and the shoulder added.

## 2024-12-03 NOTE — Telephone Encounter (Signed)
 Yes, sent per [facility]

## 2024-12-06 ENCOUNTER — Ambulatory Visit: Admitting: Physical Therapy

## 2024-12-07 ENCOUNTER — Encounter: Payer: Self-pay | Admitting: Family Medicine

## 2024-12-07 ENCOUNTER — Ambulatory Visit: Admitting: Family Medicine

## 2024-12-07 VITALS — BP 137/84 | HR 64 | Ht 65.0 in | Wt 128.0 lb

## 2024-12-07 DIAGNOSIS — E781 Pure hyperglyceridemia: Secondary | ICD-10-CM

## 2024-12-07 DIAGNOSIS — F419 Anxiety disorder, unspecified: Secondary | ICD-10-CM

## 2024-12-07 DIAGNOSIS — E782 Mixed hyperlipidemia: Secondary | ICD-10-CM

## 2024-12-07 DIAGNOSIS — F32A Depression, unspecified: Secondary | ICD-10-CM | POA: Diagnosis not present

## 2024-12-07 DIAGNOSIS — Z23 Encounter for immunization: Secondary | ICD-10-CM

## 2024-12-07 DIAGNOSIS — Z8781 Personal history of (healed) traumatic fracture: Secondary | ICD-10-CM

## 2024-12-07 DIAGNOSIS — J439 Emphysema, unspecified: Secondary | ICD-10-CM

## 2024-12-07 DIAGNOSIS — I10 Essential (primary) hypertension: Secondary | ICD-10-CM | POA: Diagnosis not present

## 2024-12-07 LAB — CBC WITH DIFF/PLATELET
Basophils Absolute: 0 x10E3/uL (ref 0.0–0.2)
Basos: 0 %
EOS (ABSOLUTE): 0.2 x10E3/uL (ref 0.0–0.4)
Eos: 2 %
Hematocrit: 42.1 % (ref 34.0–46.6)
Hemoglobin: 13.8 g/dL (ref 11.1–15.9)
Immature Grans (Abs): 0 x10E3/uL (ref 0.0–0.1)
Immature Granulocytes: 0 %
Lymphocytes Absolute: 2.4 x10E3/uL (ref 0.7–3.1)
Lymphs: 30 %
MCH: 34.2 pg — ABNORMAL HIGH (ref 26.6–33.0)
MCHC: 32.8 g/dL (ref 31.5–35.7)
MCV: 105 fL — ABNORMAL HIGH (ref 79–97)
Monocytes Absolute: 0.6 x10E3/uL (ref 0.1–0.9)
Monocytes: 7 %
Neutrophils Absolute: 4.7 x10E3/uL (ref 1.4–7.0)
Neutrophils: 61 %
Platelets: 325 x10E3/uL (ref 150–450)
RBC: 4.03 x10E6/uL (ref 3.77–5.28)
RDW: 11.9 % (ref 11.7–15.4)
WBC: 7.9 x10E3/uL (ref 3.4–10.8)

## 2024-12-07 LAB — CMP14+EGFR
ALT: 18 IU/L (ref 0–32)
AST: 9 IU/L (ref 0–40)
Albumin: 4.2 g/dL (ref 3.8–4.8)
Alkaline Phosphatase: 157 IU/L — ABNORMAL HIGH (ref 49–135)
BUN/Creatinine Ratio: 20 (ref 12–28)
BUN: 17 mg/dL (ref 8–27)
Bilirubin Total: 0.3 mg/dL (ref 0.0–1.2)
CO2: 26 mmol/L (ref 20–29)
Calcium: 9.9 mg/dL (ref 8.7–10.3)
Chloride: 104 mmol/L (ref 96–106)
Creatinine, Ser: 0.83 mg/dL (ref 0.57–1.00)
Globulin, Total: 2.5 g/dL (ref 1.5–4.5)
Glucose: 94 mg/dL (ref 70–99)
Potassium: 4.9 mmol/L (ref 3.5–5.2)
Sodium: 139 mmol/L (ref 134–144)
Total Protein: 6.7 g/dL (ref 6.0–8.5)
eGFR: 75 mL/min/1.73

## 2024-12-07 LAB — LIPID PANEL
Chol/HDL Ratio: 2.2 ratio (ref 0.0–4.4)
Cholesterol, Total: 154 mg/dL (ref 100–199)
HDL: 69 mg/dL
LDL Chol Calc (NIH): 70 mg/dL (ref 0–99)
Triglycerides: 76 mg/dL (ref 0–149)
VLDL Cholesterol Cal: 15 mg/dL (ref 5–40)

## 2024-12-07 MED ORDER — BUPROPION HCL ER (XL) 300 MG PO TB24: 300.0000 mg | ORAL_TABLET | Freq: Every day | ORAL | 1 refills | Status: AC

## 2024-12-07 MED ORDER — PANTOPRAZOLE SODIUM 40 MG PO TBEC: 40.0000 mg | DELAYED_RELEASE_TABLET | Freq: Every day | ORAL | 1 refills | Status: AC

## 2024-12-07 MED ORDER — ALPRAZOLAM 0.5 MG PO TABS: 0.5000 mg | ORAL_TABLET | Freq: Two times a day (BID) | ORAL | 1 refills | Status: AC | PRN

## 2024-12-07 MED ORDER — CITALOPRAM HYDROBROMIDE 40 MG PO TABS: 40.0000 mg | ORAL_TABLET | Freq: Every day | ORAL | 1 refills | Status: AC

## 2024-12-07 MED ORDER — GABAPENTIN 100 MG PO CAPS: 200.0000 mg | ORAL_CAPSULE | Freq: Two times a day (BID) | ORAL | 1 refills | Status: AC

## 2024-12-07 MED ORDER — JOURNAVX 50 MG PO TABS: 50.0000 mg | ORAL_TABLET | Freq: Two times a day (BID) | ORAL | 1 refills | Status: AC | PRN

## 2024-12-07 NOTE — Progress Notes (Addendum)
 "  BP 137/84   Pulse 64   Ht 5' 5 (1.651 m)   Wt 128 lb (58.1 kg)   SpO2 97%   BMI 21.30 kg/m    Subjective:   Patient ID: Jody Wilson, female    DOB: [birth year], 73 y.o.   MRN: [id number]  HPI: Jody Wilson is a 73 y.o. female presenting on 12/07/2024 for Medical Management of Chronic Issues and Hypertension   Discussed the use of AI scribe software for clinical note transcription with the patient, who gave verbal consent to proceed.  History of Present Illness   Jody Wilson is a 73 year old female who presents for a recheck of her blood pressure and pain management. She is accompanied by her 23 year old grandson, who is her driver.  Hypotension and associated symptoms - Blood pressure this morning was 107/47 mmHg prior to medication, lower than usual for her. - Recent blood pressure readings have been lower, with a previous diastolic value of 111 mmHg. - Experiences lightheadedness and dizziness, resulting in a fall with subsequent injuries.  Musculoskeletal pain and fractures - Sustained two pelvic fractures, a tailbone fracture, and a humerus fracture from a fall. - Humerus fracture has healed; persistent severe pain in shoulders and pelvic region. - Pain is exacerbated by sitting, lying down, and ambulation. - Describes pelvic region as feeling 'three times the size it should be.'  Functional impairment and incontinence - Requires pull-ups due to urinary incontinence following fractures. - Difficulty with bowel control. - Significant pain and functional limitations affecting mobility and daily activities.  Adverse reactions to pain management - Received oxycodone  during hospitalization, resulting in hallucinations and hyperactive delirium. - Oxycodone  use led to two emergency room visits for adverse effects. - Current pain regimen includes ibuprofen , which does not provide relief.  Medication management and withdrawal symptoms - Currently taking Xanax  0.5  mg twice daily; received a lower dose at [facility], resulting in withdrawal symptoms. - On Wellbutrin ; believes it was omitted from medication pack at [facility], contributing to depressive symptoms.  Rehabilitation and hospitalization course - Hospitalized for 20 days in intensive care following fall and fractures. - Received physical therapy at [facility] and further rehabilitation at [facility].  Fall risk and safety concerns - Concerned about risk of further falls due to hypotension and pain management regimen. - History of adverse reactions to opiates increases concern for safe pain control.          Relevant past medical, surgical, family and social history reviewed and updated as indicated. Interim medical history since our last visit reviewed. Allergies and medications reviewed and updated.  Review of Systems  Constitutional:  Negative for chills and fever.  HENT:  Negative for congestion, ear discharge and ear pain.   Eyes:  Negative for redness and visual disturbance.  Respiratory:  Negative for chest tightness and shortness of breath.   Cardiovascular:  Negative for chest pain and leg swelling.  Genitourinary:  Negative for difficulty urinating and dysuria.  Musculoskeletal:  Positive for arthralgias, back pain, gait problem and myalgias.  Skin:  Negative for rash.  Neurological:  Negative for dizziness, light-headedness and headaches.  Psychiatric/Behavioral:  Negative for agitation and behavioral problems.   All other systems reviewed and are negative.   Per HPI unless specifically indicated above   Allergies as of 12/07/2024       Reactions   Oxycodone  Other (See Comments)   Encephalopathy and hallucinations   Tetracyclines & Related Itching, Swelling  Medication List        Accurate as of December 07, 2024  4:25 PM. If you have any questions, ask your nurse or doctor.          STOP taking these medications    amLODipine  2.5 MG  tablet Commonly known as: NORVASC  Stopped by: Fonda Levins, MD       TAKE these medications    acetaminophen  500 MG tablet Commonly known as: TYLENOL  Take 2 tablets (1,000 mg total) by mouth 3 (three) times daily.   ALPRAZolam  0.5 MG tablet Commonly known as: XANAX  Take 1 tablet (0.5 mg total) by mouth 2 (two) times daily as needed for anxiety. What changed:  medication strength how much to take Changed by: Fonda Levins, MD   aspirin  EC 81 MG tablet TAKE 1 PO BID THRU 05/13/24 THEN RESUME 1 PO DAILY What changed:  how much to take how to take this when to take this additional instructions   buPROPion  300 MG 24 hr tablet Commonly known as: Wellbutrin  XL Take 1 tablet (300 mg total) by mouth daily. What changed:  medication strength how much to take Changed by: Fonda Levins, MD   carvedilol  12.5 MG tablet Commonly known as: COREG  Take 1 tablet (12.5 mg total) by mouth 2 (two) times daily with a meal.   citalopram  40 MG tablet Commonly known as: CELEXA  Take 1 tablet (40 mg total) by mouth daily.   diphenhydrAMINE  25 mg capsule Commonly known as: BENADRYL  Take 25 mg by mouth every 8 (eight) hours as needed.   docusate sodium  100 MG capsule Commonly known as: COLACE Take 1 capsule (100 mg total) by mouth 2 (two) times daily.   gabapentin  100 MG capsule Commonly known as: NEURONTIN  Take 2 capsules (200 mg total) by mouth 2 (two) times daily.   ibuprofen  200 MG tablet Commonly known as: ADVIL  Take 1 tablet (200 mg total) by mouth every 6 (six) hours as needed for moderate pain (pain score 4-6).   Journavx 50 MG Tabs Generic drug: Suzetrigine Take 50 mg by mouth 2 (two) times daily as needed. Started by: Fonda Levins, MD   lidocaine  5 % Commonly known as: LIDODERM  Place 1 patch onto the skin daily. Remove & Discard patch within 12 hours or as directed by MD   ondansetron  4 MG tablet Commonly known as: ZOFRAN  Take 1 tablet (4 mg total) by  mouth every 8 (eight) hours as needed for nausea or vomiting.   pantoprazole  40 MG tablet Commonly known as: PROTONIX  Take 1 tablet (40 mg total) by mouth daily.   polyethylene glycol 17 g packet Commonly known as: MIRALAX  / GLYCOLAX  Take 17 g by mouth daily as needed for mild constipation.   pravastatin  40 MG tablet Commonly known as: PRAVACHOL  Take 1 tablet (40 mg total) by mouth every evening. What changed: when to take this   sucralfate 1 g tablet Commonly known as: CARAFATE Take 1 g by mouth 4 (four) times daily.         Objective:   BP 137/84   Pulse 64   Ht 5' 5 (1.651 m)   Wt 128 lb (58.1 kg)   SpO2 97%   BMI 21.30 kg/m   Wt Readings from Last 3 Encounters:  12/07/24 128 lb (58.1 kg)  11/17/24 128 lb (58.1 kg)  10/29/24 123 lb 0.3 oz (55.8 kg)    Physical Exam Vitals and nursing note reviewed.  Constitutional:      Appearance: Normal appearance.  Musculoskeletal:        General: No swelling.  Neurological:     Mental Status: She is alert.    Physical Exam   VITALS: BP- 137/84 CHEST: Lungs clear to auscultation bilaterally. CARDIOVASCULAR: Heart regular rate and rhythm.         Assessment & Plan:   Problem List Items Addressed This Visit       Cardiovascular and Mediastinum   Essential hypertension - Primary   Relevant Orders   CBC With Diff/Platelet   CMP14+EGFR   Lipid panel     Other   Hyperlipidemia   Relevant Orders   CBC With Diff/Platelet   CMP14+EGFR   Lipid panel   Anxiety   Relevant Medications   ALPRAZolam  (XANAX ) 0.5 MG tablet   buPROPion  (WELLBUTRIN  XL) 300 MG 24 hr tablet   citalopram  (CELEXA ) 40 MG tablet   Hypertriglyceridemia   Relevant Orders   CBC With Diff/Platelet   CMP14+EGFR   Lipid panel   Other Visit Diagnoses       Emphysema lung (HCC)       Relevant Medications   diphenhydrAMINE  (BENADRYL ) 25 mg capsule     Anxiety and depression       Relevant Medications   ALPRAZolam  (XANAX ) 0.5 MG tablet    buPROPion  (WELLBUTRIN  XL) 300 MG 24 hr tablet   citalopram  (CELEXA ) 40 MG tablet     History of pelvic fracture       Relevant Medications   Suzetrigine (JOURNAVX) 50 MG TABS          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 Previous oxycodone  use caused delirium. Current ibuprofen  and Tylenol  ineffective. Gabapentin  not suitable for bone pain. Exploring non-opiate options. - Continue ibuprofen  and Tylenol . - Investigate non-opiate pain management. - Encouraged physical therapy for strength and fall prevention.  Major depressive disorder, recurrent Recurrent depression with recent exacerbation. Current low-dose Wellbutrin  insufficient. - Increased Wellbutrin  to 300 mg daily.  Essential hypertension Low blood pressure readings. Amlodipine  may contribute to hypotension. - Discontinued amlodipine . - Monitor blood pressure, consider restarting if needed.  Urinary and fecal incontinence Recent need for pull-ups. - Monitor symptoms and adjust management.       Will try journavx for pain, patient is already taking Tylenol  ibuprofen  is not helping sufficiently and opiates made her have hallucinations twice within the past month  Follow up plan: Return in about 2 months (around 02/07/2025), or if symptoms worsen or fail to improve, for Recheck anxiety and depression and pain.  Counseling provided for all of the vaccine components Orders Placed This Encounter  Procedures   CBC With Diff/Platelet   CMP14+EGFR   Lipid panel    Fonda Levins, MD [facility] 12/07/2024, 4:25 PM     "

## 2024-12-10 ENCOUNTER — Encounter (HOSPITAL_COMMUNITY): Payer: Self-pay | Admitting: Oncology

## 2024-12-10 ENCOUNTER — Telehealth: Payer: Self-pay | Admitting: Pharmacy Technician

## 2024-12-10 ENCOUNTER — Other Ambulatory Visit (HOSPITAL_COMMUNITY): Payer: Self-pay

## 2024-12-10 NOTE — Telephone Encounter (Signed)
 Pharmacy Patient Advocate Encounter  Received notification from [facility] MEDICARE that Prior Authorization for Journavx  50MG  tablets has been DENIED.  Full denial letter will be uploaded to the media tab. See denial reason below.    PA #/Case ID/Reference #: [id number]

## 2024-12-10 NOTE — Telephone Encounter (Signed)
 Pharmacy Patient Advocate Encounter   Received notification from Onbase that prior authorization for Journavx  50MG  tablets is required/requested.   Insurance verification completed.   The patient is insured through [employer].   Per test claim: PA required; PA started via CoverMyMeds. KEY [id number] . Waiting for clinical questions to populate.

## 2024-12-11 ENCOUNTER — Ambulatory Visit: Admitting: Physical Therapy

## 2024-12-11 NOTE — Therapy (Deleted)
 " OUTPATIENT PHYSICAL THERAPY LOWER EXTREMITY EVALUATION   Patient Name: Jody Wilson MRN: [id number] DOB:11-Jan-1951, 73 y.o., female Today's Date: 12/11/2024  END OF SESSION:   Past Medical History:  Diagnosis Date   Anemia    Anxiety    Aortic atherosclerosis    B12 deficiency 09/25/2019   Carotid artery disease    Depression    GERD (gastroesophageal reflux disease)    Hyperlipidemia    Hypertension    Orthostatic hypotension    Osteoporosis    Pulmonary nodule 02/09/2023   Incidental finding on chest x-ray 02/09/2023- Nodule in the RIGHT upper lobe measuring approximately 7 mm may be new from previous chest radiographs though could be a portion of scarring that is seen in the upper chest. Consider short interval PA and lateral chest radiograph when the patient is able. If persistent CT may be warranted.  - F/u as outpatient     Past Surgical History:  Procedure Laterality Date   ABDOMINAL HYSTERECTOMY     BACK SURGERY     CHOLECYSTECTOMY     FOOT SURGERY     7 in the past   HIP ARTHROPLASTY Right 02/11/2023   Procedure: ARTHROPLASTY BIPOLAR HIP (HEMIARTHROPLASTY);  Surgeon: Onesimo Oneil LABOR, MD;  Location: [facility];  Service: Orthopedics;  Laterality: Right;   HIP ARTHROPLASTY Left 04/13/2024   Procedure: HEMIARTHROPLASTY (BIPOLAR) HIP, DIRECT LATERAL  FOR FRACTURE;  Surgeon: Margrette Taft BRAVO, MD;  Location: [facility];  Service: Orthopedics;  Laterality: Left;   IR GENERIC HISTORICAL  12/09/2016   IR RADIOLOGIST EVAL & MGMT 12/09/2016 [facility]   IR GENERIC HISTORICAL  12/10/2016   IR VERTEBROPLASTY CERV/THOR BX INC UNI/BIL INC/INJECT/IMAGING 12/10/2016 [facility]   LEFT HEART CATH AND CORONARY ANGIOGRAPHY N/A 09/30/2020   Procedure: LEFT HEART CATH AND CORONARY ANGIOGRAPHY;  Surgeon: Elmira Newman PARAS, MD;  [facility];  Service: Cardiovascular;  Laterality: N/A;   LEFT HEART CATH AND CORONARY ANGIOGRAPHY N/A 06/27/2024   Procedure: LEFT HEART  CATH AND CORONARY ANGIOGRAPHY;  Surgeon: Verlin Lonni BIRCH, MD;  Location: [facility];  Service: Cardiovascular;  Laterality: N/A;   TRACHEOSTOMY TUBE PLACEMENT  1982   Patient Active Problem List   Diagnosis Date Noted   Polypharmacy 10/30/2024   Muscle weakness 10/10/2024   Recurrent major depression 10/10/2024   History of non-ST elevation myocardial infarction (NSTEMI) 06/29/2024   Abnormal CT of liver 06/26/2024   Pulmonary nodule 02/09/2023   Hypertriglyceridemia 01/22/2022   Abnormal findings on diagnostic imaging of liver and biliary tract 12/09/2021   History of colonic polyps 12/09/2021   Anxiety 06/23/2020   Hiatal hernia 06/23/2020   Thoracic aortic atherosclerosis 06/23/2020   Involuntary muscle jerks while sleeping 09/26/2019   B12 deficiency 09/25/2019   Iron deficiency anemia 09/25/2019   Osteoporosis 02/09/2017   History of vertebral fracture 02/09/2017   GERD (gastroesophageal reflux disease) 03/11/2016   Hyperlipidemia 02/19/2015   Essential hypertension 03/18/2014    PCP: Dettinger, Fonda LABOR, MD  REFERRING PROVIDER: Dettinger, Fonda LABOR, MD  REFERRING DIAG: S42.294A (ICD-10-CM) - Other closed nondisplaced fracture of proximal end of right humerus, initial encounter S32.009A (ICD-10-CM) - Closed fracture of transverse process of lumbar vertebra, initial encounter (HCC) S32.591A (ICD-10-CM) - Closed fracture of ramus of right pubis, initial encounter (HCC)  THERAPY DIAG:  No diagnosis found.  Rationale for Evaluation and Treatment: Rehabilitation  ONSET DATE: ***  SUBJECTIVE:   SUBJECTIVE STATEMENT: ***  PERTINENT HISTORY: *** PAIN:  Are  you having pain? {OPRCPAIN:27236}  PRECAUTIONS: {Therapy precautions:24002}  RED FLAGS: {PT Red Flags:29287}   WEIGHT BEARING RESTRICTIONS: {Yes ***/No:24003}  FALLS:  Has patient fallen in last 6 months? {fallsyesno:27318}  LIVING ENVIRONMENT: Lives with: {OPRC lives with:25569::lives with  their family} Lives in: {Lives in:25570} Stairs: {opstairs:27293} Has following equipment at home: {Assistive devices:23999}  OCCUPATION: ***  PLOF: {PLOF:24004}  PATIENT GOALS: ***  NEXT MD VISIT: ***  OBJECTIVE:  Note: Objective measures were completed at Evaluation unless otherwise noted.  DIAGNOSTIC FINDINGS: ***  PATIENT SURVEYS:  {rehab surveys:24030}  COGNITION: Overall cognitive status: {cognition:24006}     SENSATION: {sensation:27233}  EDEMA:  {edema:24020}  MUSCLE LENGTH: Hamstrings: Right *** deg; Left *** deg Debby test: Right *** deg; Left *** deg  POSTURE: {posture:25561}  PALPATION: ***  LOWER EXTREMITY ROM:  {AROM/PROM:27142} ROM Right eval Left eval  Hip flexion    Hip extension    Hip abduction    Hip adduction    Hip internal rotation    Hip external rotation    Knee flexion    Knee extension    Ankle dorsiflexion    Ankle plantarflexion    Ankle inversion    Ankle eversion     (Blank rows = not tested)  LOWER EXTREMITY MMT:  MMT Right eval Left eval  Hip flexion    Hip extension    Hip abduction    Hip adduction    Hip internal rotation    Hip external rotation    Knee flexion    Knee extension    Ankle dorsiflexion    Ankle plantarflexion    Ankle inversion    Ankle eversion     (Blank rows = not tested)  LOWER EXTREMITY SPECIAL TESTS:  {LEspecialtests:26242}  FUNCTIONAL TESTS:  {Functional tests:24029}  GAIT: Distance walked: *** Assistive device utilized: {Assistive devices:23999} Level of assistance: {Levels of assistance:24026} Comments: ***                                                                                                                                TREATMENT DATE: ***    PATIENT EDUCATION:  Education details: *** Person educated: {Person educated:25204} Education method: {Education Method:25205} Education comprehension: {Education Comprehension:25206}  HOME EXERCISE  PROGRAM: ***  ASSESSMENT:  CLINICAL IMPRESSION: Patient is a *** y.o. *** who was seen today for physical therapy evaluation and treatment for ***.   OBJECTIVE IMPAIRMENTS: {opptimpairments:25111}.   ACTIVITY LIMITATIONS: {activitylimitations:27494}  PARTICIPATION LIMITATIONS: {participationrestrictions:25113}  PERSONAL FACTORS: {Personal factors:25162} are also affecting patient's functional outcome.   REHAB POTENTIAL: {rehabpotential:25112}  CLINICAL DECISION MAKING: {clinical decision making:25114}  EVALUATION COMPLEXITY: {Evaluation complexity:25115}   GOALS: Goals reviewed with patient? {yes/no:20286}  SHORT TERM GOALS: Target date: *** *** Baseline: Goal status: INITIAL  2.  *** Baseline:  Goal status: INITIAL  3.  *** Baseline:  Goal status: INITIAL  4.  *** Baseline:  Goal status: INITIAL  5.  *** Baseline:  Goal status: INITIAL  6.  *** Baseline:  Goal status: INITIAL  LONG TERM GOALS: Target date: ***  *** Baseline:  Goal status: INITIAL  2.  *** Baseline:  Goal status: INITIAL  3.  *** Baseline:  Goal status: INITIAL  4.  *** Baseline:  Goal status: INITIAL  5.  *** Baseline:  Goal status: INITIAL  6.  *** Baseline:  Goal status: INITIAL   PLAN:  PT FREQUENCY: {rehab frequency:25116}  PT DURATION: {rehab duration:25117}  PLANNED INTERVENTIONS: {rehab planned interventions:25118::97110-Therapeutic exercises,97530- Therapeutic [phone]- Neuromuscular re-education,97535- Self Rjmz,02859- Manual therapy,Patient/Family education}  PLAN FOR NEXT SESSION: ***   Fard Borunda April Ma L Trigg Delarocha, PT 12/11/2024, 7:53 AM  "

## 2024-12-21 ENCOUNTER — Ambulatory Visit: Payer: Self-pay | Admitting: Family Medicine

## 2024-12-24 ENCOUNTER — Ambulatory Visit: Admitting: Orthopedic Surgery

## 2024-12-26 ENCOUNTER — Ambulatory Visit: Attending: Orthopedic Surgery | Admitting: Physical Therapy

## 2024-12-26 DIAGNOSIS — R2689 Other abnormalities of gait and mobility: Secondary | ICD-10-CM | POA: Diagnosis present

## 2024-12-26 DIAGNOSIS — M6281 Muscle weakness (generalized): Secondary | ICD-10-CM | POA: Insufficient documentation

## 2024-12-26 DIAGNOSIS — M5459 Other low back pain: Secondary | ICD-10-CM | POA: Insufficient documentation

## 2024-12-26 DIAGNOSIS — S42294A Other nondisplaced fracture of upper end of right humerus, initial encounter for closed fracture: Secondary | ICD-10-CM | POA: Insufficient documentation

## 2024-12-26 DIAGNOSIS — M25611 Stiffness of right shoulder, not elsewhere classified: Secondary | ICD-10-CM | POA: Insufficient documentation

## 2024-12-26 DIAGNOSIS — S32009A Unspecified fracture of unspecified lumbar vertebra, initial encounter for closed fracture: Secondary | ICD-10-CM | POA: Diagnosis not present

## 2024-12-26 DIAGNOSIS — M25552 Pain in left hip: Secondary | ICD-10-CM | POA: Diagnosis present

## 2024-12-26 DIAGNOSIS — S32591A Other specified fracture of right pubis, initial encounter for closed fracture: Secondary | ICD-10-CM | POA: Diagnosis not present

## 2024-12-26 DIAGNOSIS — G8929 Other chronic pain: Secondary | ICD-10-CM | POA: Diagnosis present

## 2024-12-26 DIAGNOSIS — M25511 Pain in right shoulder: Secondary | ICD-10-CM | POA: Diagnosis present

## 2024-12-26 NOTE — Therapy (Addendum)
 " OUTPATIENT PHYSICAL THERAPY SHOULDER EVALUATION   Patient Name: Jody Wilson MRN: [id number] DOB:07-Aug-1951, 74 y.o., female Today's Date: 12/26/2024  END OF SESSION:  PT End of Session - 12/26/24 1130     Visit Number 1    Number of Visits 12    Date for Recertification  02/06/25    PT Start Time 1100    Activity Tolerance Patient tolerated treatment well    Behavior During Therapy [facility] for tasks assessed/performed          Past Medical History:  Diagnosis Date   Anemia    Anxiety    Aortic atherosclerosis    B12 deficiency 09/25/2019   Carotid artery disease    Depression    GERD (gastroesophageal reflux disease)    Hyperlipidemia    Hypertension    Orthostatic hypotension    Osteoporosis    Pulmonary nodule 02/09/2023   Incidental finding on chest x-ray 02/09/2023- Nodule in the RIGHT upper lobe measuring approximately 7 mm may be new from previous chest radiographs though could be a portion of scarring that is seen in the upper chest. Consider short interval PA and lateral chest radiograph when the patient is able. If persistent CT may be warranted.  - F/u as outpatient     Past Surgical History:  Procedure Laterality Date   ABDOMINAL HYSTERECTOMY     BACK SURGERY     CHOLECYSTECTOMY     FOOT SURGERY     7 in the past   HIP ARTHROPLASTY Right 02/11/2023   Procedure: ARTHROPLASTY BIPOLAR HIP (HEMIARTHROPLASTY);  Surgeon: Onesimo Oneil LABOR, MD;  Location: [facility];  Service: Orthopedics;  Laterality: Right;   HIP ARTHROPLASTY Left 04/13/2024   Procedure: HEMIARTHROPLASTY (BIPOLAR) HIP, DIRECT LATERAL  FOR FRACTURE;  Surgeon: Margrette Taft BRAVO, MD;  Location: [facility];  Service: Orthopedics;  Laterality: Left;   IR GENERIC HISTORICAL  12/09/2016   IR RADIOLOGIST EVAL & MGMT 12/09/2016 [facility]   IR GENERIC HISTORICAL  12/10/2016   IR VERTEBROPLASTY CERV/THOR BX INC UNI/BIL INC/INJECT/IMAGING 12/10/2016 [facility]   LEFT HEART CATH AND CORONARY ANGIOGRAPHY N/A  09/30/2020   Procedure: LEFT HEART CATH AND CORONARY ANGIOGRAPHY;  Surgeon: Elmira Newman PARAS, MD;  [facility];  Service: Cardiovascular;  Laterality: N/A;   LEFT HEART CATH AND CORONARY ANGIOGRAPHY N/A 06/27/2024   Procedure: LEFT HEART CATH AND CORONARY ANGIOGRAPHY;  Surgeon: Verlin Lonni BIRCH, MD;  [facility];  Service: Cardiovascular;  Laterality: N/A;   TRACHEOSTOMY TUBE PLACEMENT  1982   Patient Active Problem List   Diagnosis Date Noted   Polypharmacy 10/30/2024   Muscle weakness 10/10/2024   Recurrent major depression 10/10/2024   History of non-ST elevation myocardial infarction (NSTEMI) 06/29/2024   Abnormal CT of liver 06/26/2024   Pulmonary nodule 02/09/2023   Hypertriglyceridemia 01/22/2022   Abnormal findings on diagnostic imaging of liver and biliary tract 12/09/2021   History of colonic polyps 12/09/2021   Anxiety 06/23/2020   Hiatal hernia 06/23/2020   Thoracic aortic atherosclerosis 06/23/2020   Involuntary muscle jerks while sleeping 09/26/2019   B12 deficiency 09/25/2019   Iron deficiency anemia 09/25/2019   Osteoporosis 02/09/2017   History of vertebral fracture 02/09/2017   GERD (gastroesophageal reflux disease) 03/11/2016   Hyperlipidemia 02/19/2015   Essential hypertension 03/18/2014    REFERRING PROVIDER: Taft Margrette MD  REFERRING DIAG: Fx proximal end of right humerus.  Closed fx of transverse lumbar processes and right pubis.  THERAPY DIAG:  Other low back pain  Chronic right shoulder pain  Stiffness of right shoulder, not elsewhere classified  Other abnormalities of gait and mobility  Rationale for Evaluation and Treatment: Rehabilitation  ONSET DATE: 10/03/24  SUBJECTIVE:                                                                                                                                                                                      SUBJECTIVE STATEMENT: The patient  presents to the clinic with c/o right shoulder and low back pain as the result fall which she states she was walking fast and her feet got tangled up.  This resulted in a right proximal humeral fracture, lumbar transverse process fracture and a right pubis fracture.  Her pain is rated at a 7-8/10.  Her pain is described as an ache and sore.  She states she was in a sling for 7 weeks.  She has progressed to walking with a straight cane.  PERTINENT HISTORY: OP, bilateral hip surgery.  Please see above.    PAIN:  Are you having pain? Yes: NPRS scale: 7-8/10.   Pain location: Low back and right shoulder. Pain description: As above.   Aggravating factors: Walking. Relieving factors: Nothing.  PRECAUTIONS: Other: Please supervise gait.    RED FLAGS: None   WEIGHT BEARING RESTRICTIONS: No  FALLS:  Has patient fallen in last 6 months? Yes. Number of falls 1.    LIVING ENVIRONMENT: Lives with: lives alone Lives in: House/apartment Has following equipment at home: Single point cane  OCCUPATION: Retired.    PLOF: Independent  PATIENT GOALS:  Regain use of right UE and walk better.    NEXT MD VISIT:   OBJECTIVE:  Note: Objective measures were completed at Evaluation unless otherwise noted.  DIAGNOSTIC FINDINGS:  11/19/24:  Callus is seen around the proximal humerus fracture which was noted several weeks before.  It is minimally displaced with slight angulation.  The patient is noted to have a previous cervical fusion   Impression healed right proximal humerus fracture  10/29/24:  IMPRESSION: 1. Subacute fractures of the right L4 and bilateral L5 transverse processes, with progressive displacement compared to prior examinations. No evidence of vertebral body fracture. 2. Progressive displacement of previously demonstrated bilateral sacral fractures, incompletely visualized. 3. Multilevel spondylosis as detailed above. No high-grade spinal stenosis or high-grade foraminal  narrowing identified.  PATIENT SURVEYS:  LEFS:  7/80.  POSTURE: Flexed trunk.  Right ACJ malignment.  Forward head and rounded shoulder.    UPPER EXTREMITY ROM:   In supine:  Gentle P/AROM right shoulder flexion to 95 degrees.  ER is -15 degrees.  UPPER/LOWER EXTREMITY MMT:  Via manual muscle testing she was able to provide a bilateral quad strength of 4+/5.  Right UE NT.  PALPATION:  Very tender to palpation over right proximal humeral/middle deltoid region.  She is also very tender to palpation at L4-S1 and especially on the left in the region of the left SIJ region.   GAIT:     Antalgic gait with patient using a straight cane with a decrease in step and stride length.  FUNCTIONAL TESTS:   TUG:  23 seconds  Five time sit to stand:  30 seconds                                                                                                                             TREATMENT DATE: 12/26/24:  HMP and IFC at 80-150 Hz on 40% scan x 15 minutes to right shoulder f/b gentle PROM x 8 minutes.     PATIENT EDUCATION: Education details:  Person educated:  International aid/development worker:  Education comprehension:   HOME EXERCISE PROGRAM:   ASSESSMENT:  CLINICAL IMPRESSION: The patient presents to [facility] with a diagnosis of right proximal humeral fracture and lumbar transverse fracture ans right pubic fracture. Her right shoulder has a significant loss of motion and function at this time.   She is very tender to palpation over right proximal humeral/middle deltoid region and at L4-S1 and especially on the left in the region of the left SIJ region. Her TUG was performed in 23 seconds and 5 time sit to stand test performed in 30 seconds. Her LEFS score is 7/80.  Patient will benefit from skilled physical therapy intervention to address pain and deficits.     OBJECTIVE IMPAIRMENTS: Abnormal gait, decreased activity tolerance, difficulty walking, decreased ROM, increased muscle spasms, and pain.    ACTIVITY LIMITATIONS: carrying, lifting, bending, sitting, standing, stairs, bed mobility, and locomotion level  PARTICIPATION LIMITATIONS: meal prep, cleaning, laundry, driving, shopping, occupation, and yard work  PERSONAL FACTORS: Time since onset of injury/illness/exacerbation and 1 comorbidity: bilateral hip surgeries are also affecting patient's functional outcome.   REHAB POTENTIAL: Good(-)/Good.  CLINICAL DECISION MAKING: Evolving/moderate complexity  EVALUATION COMPLEXITY: Moderate   GOALS:  SHORT TERM GOALS: Target date: 01/09/25  Ind with an initial HEP. Goal status: INITIAL   LONG TERM GOALS: Target date: 02/06/25  Ind with an advanced HEP.  Goal status: INITIAL  2.  Improve TUG by 5 seconds.  Goal status: INITIAL  3.  Improve  time sit to stand test by 5 seconds.  Goal status: INITIAL  4.  Patient walk without antalgia.  Goal status: INITIAL  5.  Active right shoulder flexion to 125-130 degrees so the patient can easily reach overhead.  Goal status: INITIAL  6.  Active ER to 70 degrees+ to allow for easily donning/doffing of apparel. Goal status: INITIAL   7.  Perform ADL's with pain not > 4/10.    Goal status: INITIAL PLAN:  PT FREQUENCY:  2x/week  PT DURATION: 6 weeks  PLANNED INTERVENTIONS: 97110-Therapeutic exercises, 97530- Therapeutic activity, [id number]- Neuromuscular re-education, 97535- Self Care, [id number]- Manual therapy, [id number]- Electrical stimulation (unattended), Patient/Family education, and Moist heat  PLAN FOR NEXT SESSION: Nustep, pulleys, seated UE Ranger.  P, P/AROM to right shoulder.  Gait training.     Hager Compston, PT 12/26/2024, 12:08 PM  "

## 2024-12-28 ENCOUNTER — Ambulatory Visit: Admitting: *Deleted

## 2024-12-28 ENCOUNTER — Encounter: Payer: Self-pay | Admitting: *Deleted

## 2024-12-28 DIAGNOSIS — M25552 Pain in left hip: Secondary | ICD-10-CM

## 2024-12-28 DIAGNOSIS — M25611 Stiffness of right shoulder, not elsewhere classified: Secondary | ICD-10-CM

## 2024-12-28 DIAGNOSIS — R2689 Other abnormalities of gait and mobility: Secondary | ICD-10-CM

## 2024-12-28 DIAGNOSIS — M5459 Other low back pain: Secondary | ICD-10-CM | POA: Diagnosis not present

## 2024-12-28 DIAGNOSIS — M6281 Muscle weakness (generalized): Secondary | ICD-10-CM

## 2024-12-28 DIAGNOSIS — G8929 Other chronic pain: Secondary | ICD-10-CM

## 2024-12-28 NOTE — Therapy (Signed)
 " OUTPATIENT PHYSICAL THERAPY SHOULDER/ PELVIS   Patient Name: Jody Wilson MRN: [id number] DOB:July 26, 1951, 74 y.o., female Today's Date: 12/28/2024  END OF SESSION:  PT End of Session - 12/28/24 1110     Visit Number 2    Number of Visits 12    Date for Recertification  02/06/25    PT Start Time 1100    PT Stop Time 1147    PT Time Calculation (min) 47 min          Past Medical History:  Diagnosis Date   Anemia    Anxiety    Aortic atherosclerosis    B12 deficiency 09/25/2019   Carotid artery disease    Depression    GERD (gastroesophageal reflux disease)    Hyperlipidemia    Hypertension    Orthostatic hypotension    Osteoporosis    Pulmonary nodule 02/09/2023   Incidental finding on chest x-ray 02/09/2023- Nodule in the RIGHT upper lobe measuring approximately 7 mm may be new from previous chest radiographs though could be a portion of scarring that is seen in the upper chest. Consider short interval PA and lateral chest radiograph when the patient is able. If persistent CT may be warranted.  - F/u as outpatient     Past Surgical History:  Procedure Laterality Date   ABDOMINAL HYSTERECTOMY     BACK SURGERY     CHOLECYSTECTOMY     FOOT SURGERY     7 in the past   HIP ARTHROPLASTY Right 02/11/2023   Procedure: ARTHROPLASTY BIPOLAR HIP (HEMIARTHROPLASTY);  Surgeon: Onesimo Oneil LABOR, MD;  Location: [facility];  Service: Orthopedics;  Laterality: Right;   HIP ARTHROPLASTY Left 04/13/2024   Procedure: HEMIARTHROPLASTY (BIPOLAR) HIP, DIRECT LATERAL  FOR FRACTURE;  Surgeon: Margrette Taft BRAVO, MD;  Location: [facility];  Service: Orthopedics;  Laterality: Left;   IR GENERIC HISTORICAL  12/09/2016   IR RADIOLOGIST EVAL & MGMT 12/09/2016 [facility]   IR GENERIC HISTORICAL  12/10/2016   IR VERTEBROPLASTY CERV/THOR BX INC UNI/BIL INC/INJECT/IMAGING 12/10/2016 [facility]   LEFT HEART CATH AND CORONARY ANGIOGRAPHY N/A 09/30/2020   Procedure: LEFT HEART CATH AND CORONARY  ANGIOGRAPHY;  Surgeon: Elmira Newman PARAS, MD;  [facility]: [facility];  Service: Cardiovascular;  Laterality: N/A;   LEFT HEART CATH AND CORONARY ANGIOGRAPHY N/A 06/27/2024   Procedure: LEFT HEART CATH AND CORONARY ANGIOGRAPHY;  Surgeon: Verlin Lonni BIRCH, MD;  Location: [facility];  Service: Cardiovascular;  Laterality: N/A;   TRACHEOSTOMY TUBE PLACEMENT  1982   Patient Active Problem List   Diagnosis Date Noted   Polypharmacy 10/30/2024   Muscle weakness 10/10/2024   Recurrent major depression 10/10/2024   History of non-ST elevation myocardial infarction (NSTEMI) 06/29/2024   Abnormal CT of liver 06/26/2024   Pulmonary nodule 02/09/2023   Hypertriglyceridemia 01/22/2022   Abnormal findings on diagnostic imaging of liver and biliary tract 12/09/2021   History of colonic polyps 12/09/2021   Anxiety 06/23/2020   Hiatal hernia 06/23/2020   Thoracic aortic atherosclerosis 06/23/2020   Involuntary muscle jerks while sleeping 09/26/2019   B12 deficiency 09/25/2019   Iron deficiency anemia 09/25/2019   Osteoporosis 02/09/2017   History of vertebral fracture 02/09/2017   GERD (gastroesophageal reflux disease) 03/11/2016   Hyperlipidemia 02/19/2015   Essential hypertension 03/18/2014    REFERRING PROVIDER: Taft Margrette MD  REFERRING DIAG: Fx proximal end of right humerus.  Closed fx of transverse lumbar processes and right pubis.    THERAPY DIAG:  Other low back pain  Chronic right shoulder pain  Stiffness of right shoulder, not elsewhere classified  Other abnormalities of gait and mobility  Pain in left hip  Muscle weakness (generalized)  Rationale for Evaluation and Treatment: Rehabilitation  ONSET DATE: 10/03/24  SUBJECTIVE:                                                                                                                                                                                      SUBJECTIVE STATEMENT: The patient  presents to the clinic with c/o right shoulder and low back pain as the result fall which she states she was walking fast and her feet got tangled up.   Husband also passed away   PERTINENT HISTORY: OP, bilateral hip surgery.  Please see above.    PAIN:  Are you having pain? Yes: NPRS scale: 7-8/10.   Pain location: Low back and right shoulder. Pain description: As above.   Aggravating factors: Walking. Relieving factors: Nothing.  PRECAUTIONS: Other: Please supervise gait.    RED FLAGS: None   WEIGHT BEARING RESTRICTIONS: No  FALLS:  Has patient fallen in last 6 months? Yes. Number of falls 1.    LIVING ENVIRONMENT: Lives with: lives alone Lives in: House/apartment Has following equipment at home: Single point cane  OCCUPATION: Retired.    PLOF: Independent  PATIENT GOALS:  Regain use of right UE and walk better.    NEXT MD VISIT:   OBJECTIVE:  Note: Objective measures were completed at Evaluation unless otherwise noted.  DIAGNOSTIC FINDINGS:  11/19/24:  Callus is seen around the proximal humerus fracture which was noted several weeks before.  It is minimally displaced with slight angulation.  The patient is noted to have a previous cervical fusion   Impression healed right proximal humerus fracture  10/29/24:  IMPRESSION: 1. Subacute fractures of the right L4 and bilateral L5 transverse processes, with progressive displacement compared to prior examinations. No evidence of vertebral body fracture. 2. Progressive displacement of previously demonstrated bilateral sacral fractures, incompletely visualized. 3. Multilevel spondylosis as detailed above. No high-grade spinal stenosis or high-grade foraminal narrowing identified.  PATIENT SURVEYS:  LEFS:  7/80.  POSTURE: Flexed trunk.  Right ACJ malignment.  Forward head and rounded shoulder.    UPPER EXTREMITY ROM:   In supine:  Gentle P/AROM right shoulder flexion to 95 degrees.  ER is -15 degrees.     UPPER/LOWER EXTREMITY MMT:  Via manual muscle testing she was able to provide a bilateral quad strength of 4+/5.  Right UE NT.  PALPATION:  Very tender to palpation over right proximal humeral/middle deltoid region.  She is alos very tender to  palpation at L4-S1 and especially on the left in the region of the left SIJ region.   GAIT:     Antalgic gait with patient using a straight cane with a decrease in step and stride length.  FUNCTIONAL TESTS:   TUG:  23 seconds  Five time sit to stand:  30 seconds                                                                                                                             TREATMENT DATE: 12/28/24: Nustep L3  12 mins  Pulleys  x 3 mins Seated ranger x 3 mins Seated Punch 2x10  Supine elevation Seated marching  2 x 20 LAQs 2# 2x10 pause at top  HEP for table top UE exs and seated LE exs  Manual PROM and AAROM for elevation and ER  RT shldr             PATIENT EDUCATION: Education details:  Person educated:  International aid/development worker:  Education comprehension:   HOME EXERCISE PROGRAM:   ASSESSMENT:  CLINICAL IMPRESSION: The patient presents to OPPT  doing fair with RT shldr and pelvic pain and ambulating with SPC. Rx focused on RT shldr therex and manual P/AROM as well as LE act.'s and did well. RT shldr fairly limited in ROM due to tightness/pain.     OBJECTIVE IMPAIRMENTS: Abnormal gait, decreased activity tolerance, difficulty walking, decreased ROM, increased muscle spasms, and pain.   ACTIVITY LIMITATIONS: carrying, lifting, bending, sitting, standing, stairs, bed mobility, and locomotion level  PARTICIPATION LIMITATIONS: meal prep, cleaning, laundry, driving, shopping, occupation, and yard work  PERSONAL FACTORS: Time since onset of injury/illness/exacerbation and 1 comorbidity: bilateral hip surgeries are also affecting patient's functional outcome.   REHAB POTENTIAL: Good(-)/Good.  CLINICAL DECISION  MAKING: Evolving/moderate complexity  EVALUATION COMPLEXITY: Moderate   GOALS:  SHORT TERM GOALS: Target date: 01/09/25  Ind with an initial HEP. Goal status: INITIAL   LONG TERM GOALS: Target date: 02/06/25  Ind with an advanced HEP.  Goal status: INITIAL  2.  Improve TUG by 5 seconds.  Goal status: INITIAL  3.  Improve  time sit to stand test by 5 seconds.  Goal status: INITIAL  4.  Patient walk without antalgia.  Goal status: INITIAL  5.  Active right shoulder flexion to 125-130 degrees so the patient can easily reach overhead.  Goal status: INITIAL  6.  Active ER to 70 degrees+ to allow for easily donning/doffing of apparel. Goal status: INITIAL   7.  Perform ADL's with pain not > 4/10.    Goal status: INITIAL PLAN:  PT FREQUENCY: 2x/week  PT DURATION: 6 weeks  PLANNED INTERVENTIONS: 97110-Therapeutic exercises, 97530- Therapeutic activity, [id number]- Neuromuscular re-education, 97535- Self Care, 02859- Manual therapy, G0283- Electrical stimulation (unattended), Patient/Family education, and Moist heat  PLAN FOR NEXT SESSION: Nustep, pulleys, seated UE Ranger.  P, P/AROM to right shoulder.  Gait training.     Zebulun Deman,CHRIS, PTA 12/28/2024,  1:23 PM  "

## 2025-01-01 ENCOUNTER — Ambulatory Visit: Admitting: Physical Therapy

## 2025-01-01 DIAGNOSIS — G8929 Other chronic pain: Secondary | ICD-10-CM

## 2025-01-01 DIAGNOSIS — M5459 Other low back pain: Secondary | ICD-10-CM | POA: Diagnosis not present

## 2025-01-01 DIAGNOSIS — R2689 Other abnormalities of gait and mobility: Secondary | ICD-10-CM

## 2025-01-01 DIAGNOSIS — M25611 Stiffness of right shoulder, not elsewhere classified: Secondary | ICD-10-CM

## 2025-01-01 NOTE — Therapy (Signed)
 " OUTPATIENT PHYSICAL THERAPY SHOULDER/ PELVIS   Patient Name: Jody Wilson MRN: [id number] DOB:06-Aug-1951, 74 y.o., female Today's Date: 01/01/2025  END OF SESSION:  PT End of Session - 01/01/25 1317     Visit Number 3    Number of Visits 12    Date for Recertification  02/06/25    PT Start Time 1255    PT Stop Time 1356    PT Time Calculation (min) 61 min    Activity Tolerance Patient tolerated treatment well    Behavior During Therapy [facility] for tasks assessed/performed          Past Medical History:  Diagnosis Date   Anemia    Anxiety    Aortic atherosclerosis    B12 deficiency 09/25/2019   Carotid artery disease    Depression    GERD (gastroesophageal reflux disease)    Hyperlipidemia    Hypertension    Orthostatic hypotension    Osteoporosis    Pulmonary nodule 02/09/2023   Incidental finding on chest x-ray 02/09/2023- Nodule in the RIGHT upper lobe measuring approximately 7 mm may be new from previous chest radiographs though could be a portion of scarring that is seen in the upper chest. Consider short interval PA and lateral chest radiograph when the patient is able. If persistent CT may be warranted.  - F/u as outpatient     Past Surgical History:  Procedure Laterality Date   ABDOMINAL HYSTERECTOMY     BACK SURGERY     CHOLECYSTECTOMY     FOOT SURGERY     7 in the past   HIP ARTHROPLASTY Right 02/11/2023   Procedure: ARTHROPLASTY BIPOLAR HIP (HEMIARTHROPLASTY);  Surgeon: Onesimo Oneil LABOR, MD;  Location: [facility];  Service: Orthopedics;  Laterality: Right;   HIP ARTHROPLASTY Left 04/13/2024   Procedure: HEMIARTHROPLASTY (BIPOLAR) HIP, DIRECT LATERAL  FOR FRACTURE;  Surgeon: Margrette Taft BRAVO, MD;  Location: [facility];  Service: Orthopedics;  Laterality: Left;   IR GENERIC HISTORICAL  12/09/2016   IR RADIOLOGIST EVAL & MGMT 12/09/2016 [facility]   IR GENERIC HISTORICAL  12/10/2016   IR VERTEBROPLASTY CERV/THOR BX INC UNI/BIL INC/INJECT/IMAGING 12/10/2016  [facility]   LEFT HEART CATH AND CORONARY ANGIOGRAPHY N/A 09/30/2020   Procedure: LEFT HEART CATH AND CORONARY ANGIOGRAPHY;  Surgeon: Elmira Newman PARAS, MD;  [facility];  Service: Cardiovascular;  Laterality: N/A;   LEFT HEART CATH AND CORONARY ANGIOGRAPHY N/A 06/27/2024   Procedure: LEFT HEART CATH AND CORONARY ANGIOGRAPHY;  Surgeon: Verlin Lonni BIRCH, MD;  [facility];  Service: Cardiovascular;  Laterality: N/A;   TRACHEOSTOMY TUBE PLACEMENT  1982   Patient Active Problem List   Diagnosis Date Noted   Polypharmacy 10/30/2024   Muscle weakness 10/10/2024   Recurrent major depression 10/10/2024   History of non-ST elevation myocardial infarction (NSTEMI) 06/29/2024   Abnormal CT of liver 06/26/2024   Pulmonary nodule 02/09/2023   Hypertriglyceridemia 01/22/2022   Abnormal findings on diagnostic imaging of liver and biliary tract 12/09/2021   History of colonic polyps 12/09/2021   Anxiety 06/23/2020   Hiatal hernia 06/23/2020   Thoracic aortic atherosclerosis 06/23/2020   Involuntary muscle jerks while sleeping 09/26/2019   B12 deficiency 09/25/2019   Iron deficiency anemia 09/25/2019   Osteoporosis 02/09/2017   History of vertebral fracture 02/09/2017   GERD (gastroesophageal reflux disease) 03/11/2016   Hyperlipidemia 02/19/2015   Essential hypertension 03/18/2014    REFERRING PROVIDER: Taft Margrette MD  REFERRING DIAG: Fx proximal end  of right humerus.  Closed fx of transverse lumbar processes and right pubis.    THERAPY DIAG:  Other low back pain  Chronic right shoulder pain  Stiffness of right shoulder, not elsewhere classified  Other abnormalities of gait and mobility  Rationale for Evaluation and Treatment: Rehabilitation  ONSET DATE: 10/03/24  SUBJECTIVE:                                                                                                                                                                                       SUBJECTIVE STATEMENT: Back hurts a lot today.  PERTINENT HISTORY: OP, bilateral hip surgery.  Please see above.    PAIN:  Are you having pain? Yes: NPRS scale: 7-8/10.   Pain location: Low back and right shoulder. Pain description: As above.   Aggravating factors: Walking. Relieving factors: Nothing.  PRECAUTIONS: Other: Please supervise gait.    RED FLAGS: None   WEIGHT BEARING RESTRICTIONS: No  FALLS:  Has patient fallen in last 6 months? Yes. Number of falls 1.    LIVING ENVIRONMENT: Lives with: lives alone Lives in: House/apartment Has following equipment at home: Single point cane  OCCUPATION: Retired.    PLOF: Independent  PATIENT GOALS:  Regain use of right UE and walk better.    NEXT MD VISIT:   OBJECTIVE:  Note: Objective measures were completed at Evaluation unless otherwise noted.  DIAGNOSTIC FINDINGS:  11/19/24:  Callus is seen around the proximal humerus fracture which was noted several weeks before.  It is minimally displaced with slight angulation.  The patient is noted to have a previous cervical fusion   Impression healed right proximal humerus fracture  10/29/24:  IMPRESSION: 1. Subacute fractures of the right L4 and bilateral L5 transverse processes, with progressive displacement compared to prior examinations. No evidence of vertebral body fracture. 2. Progressive displacement of previously demonstrated bilateral sacral fractures, incompletely visualized. 3. Multilevel spondylosis as detailed above. No high-grade spinal stenosis or high-grade foraminal narrowing identified.  PATIENT SURVEYS:  LEFS:  7/80.  POSTURE: Flexed trunk.  Right ACJ malignment.  Forward head and rounded shoulder.    UPPER EXTREMITY ROM:   In supine:  Gentle P/AROM right shoulder flexion to 95 degrees.  ER is -15 degrees.    UPPER/LOWER EXTREMITY MMT:  Via manual muscle testing she was able to provide a bilateral quad strength of 4+/5.   Right UE NT.  PALPATION:  Very tender to palpation over right proximal humeral/middle deltoid region.  She is also very tender to palpation at L4-S1 and especially on the left in the region of the left SIJ region.   GAIT:  Antalgic gait with patient using a straight cane with a decrease in step and stride length.  FUNCTIONAL TESTS:   TUG:  23 seconds  Five time sit to stand:  30 seconds                                                                                                                             TREATMENT DATE:   01/01/25:  Nustep level 2 x 15 minutes f/b pulleys x 5 minutes f/b seated UE ranger x 6 minutes f/b gentle PROM x 12 minutes to patient's right shoulder f/b HMP x 10 minutes.    12/28/24: Nustep L3  12 mins  Pulleys  x 3 mins Seated ranger x 3 mins Seated Punch 2x10  Supine elevation Seated marching  2 x 20 LAQs 2# 2x10 pause at top  HEP for table top UE exs and seated LE exs  Manual PROM and AAROM for elevation and ER  RT shldr             PATIENT EDUCATION: Education details:  Person educated:  International aid/development worker:  Education comprehension:   HOME EXERCISE PROGRAM:   ASSESSMENT:  CLINICAL IMPRESSION: Patient states she woke up with a lot of back pain today.  She did well with tx today focused on her right shoulder.    OBJECTIVE IMPAIRMENTS: Abnormal gait, decreased activity tolerance, difficulty walking, decreased ROM, increased muscle spasms, and pain.   ACTIVITY LIMITATIONS: carrying, lifting, bending, sitting, standing, stairs, bed mobility, and locomotion level  PARTICIPATION LIMITATIONS: meal prep, cleaning, laundry, driving, shopping, occupation, and yard work  PERSONAL FACTORS: Time since onset of injury/illness/exacerbation and 1 comorbidity: bilateral hip surgeries are also affecting patient's functional outcome.   REHAB POTENTIAL: Good(-)/Good.  CLINICAL DECISION MAKING: Evolving/moderate complexity  EVALUATION COMPLEXITY:  Moderate   GOALS:  SHORT TERM GOALS: Target date: 01/09/25  Ind with an initial HEP. Goal status: INITIAL   LONG TERM GOALS: Target date: 02/06/25  Ind with an advanced HEP.  Goal status: INITIAL  2.  Improve TUG by 5 seconds.  Goal status: INITIAL  3.  Improve  time sit to stand test by 5 seconds.  Goal status: INITIAL  4.  Patient walk without antalgia.  Goal status: INITIAL  5.  Active right shoulder flexion to 125-130 degrees so the patient can easily reach overhead.  Goal status: INITIAL  6.  Active ER to 70 degrees+ to allow for easily donning/doffing of apparel. Goal status: INITIAL   7.  Perform ADL's with pain not > 4/10.    Goal status: INITIAL PLAN:  PT FREQUENCY: 2x/week  PT DURATION: 6 weeks  PLANNED INTERVENTIONS: 97110-Therapeutic exercises, 97530- Therapeutic activity, [id number]- Neuromuscular re-education, 97535- Self Care, 02859- Manual therapy, G0283- Electrical stimulation (unattended), Patient/Family education, and Moist heat  PLAN FOR NEXT SESSION: Nustep, pulleys, seated UE Ranger.  P, P/AROM to right shoulder.  Gait training.     Quency Tober, PT 01/01/2025, 2:09 PM  "

## 2025-01-02 ENCOUNTER — Encounter: Payer: Self-pay | Admitting: Orthopedic Surgery

## 2025-01-02 ENCOUNTER — Ambulatory Visit: Admitting: Orthopedic Surgery

## 2025-01-02 DIAGNOSIS — S42294D Other nondisplaced fracture of upper end of right humerus, subsequent encounter for fracture with routine healing: Secondary | ICD-10-CM

## 2025-01-02 DIAGNOSIS — S32009D Unspecified fracture of unspecified lumbar vertebra, subsequent encounter for fracture with routine healing: Secondary | ICD-10-CM | POA: Diagnosis not present

## 2025-01-02 DIAGNOSIS — S32591D Other specified fracture of right pubis, subsequent encounter for fracture with routine healing: Secondary | ICD-10-CM | POA: Diagnosis not present

## 2025-01-02 DIAGNOSIS — S3210XD Unspecified fracture of sacrum, subsequent encounter for fracture with routine healing: Secondary | ICD-10-CM | POA: Diagnosis not present

## 2025-01-02 MED ORDER — HYDROCODONE-ACETAMINOPHEN 5-325 MG PO TABS: 1.0000 | ORAL_TABLET | Freq: Four times a day (QID) | ORAL | 0 refills | Status: AC | PRN

## 2025-01-02 NOTE — Patient Instructions (Addendum)
 Plan  #1 we will get you something for pain  #2 you will continue therapy and see me in 6 weeks for your right shoulder  #3 will do an MRI of your spine and have you see the back specialist for further evaluation  While we are working on your approval for MRI please go ahead and call to schedule your appointment with [name] within at least one (1) week.   Central Scheduling [phone]   We are referring you to [facility] from [facility] address is [address] [place] The phone number is [phone]  The office will call you with an appointment Dr. Georgina

## 2025-01-02 NOTE — Progress Notes (Signed)
 FOLLOW-UP OFFICE VISIT   Patient: Jody Wilson           Date of Birth: Feb 28, 1951           MRN: [id number] Visit Date: 01/02/2025 Requested by: Dettinger, Fonda LABOR, MD [address] [place],  [place] [address] PCP: Dettinger, Fonda LABOR, MD    Encounter Diagnoses  Name Primary?   Closed fracture of sacrum with routine healing, unspecified portion of sacrum, subsequent encounter Yes   Closed fracture of transverse process of lumbar vertebra with routine healing, subsequent encounter    Other closed nondisplaced fracture of proximal end of right humerus with routine healing, subsequent encounter    Closed fracture of ramus of right pubis with routine healing, subsequent encounter     Chief Complaint  Patient presents with   Shoulder Injury    Right / Oct 15th 2025    Tailbone Pain    Has a lot of pain back/ sacral/ pelvic fractures Oct 03 2024   Back Pain    Lumbar pain Oct 03 2024    [name] comes in for follow-up regarding her right shoulder she had other fractures as noted above  She is complaining of mild shoulder pain but severe back pain.  She says you have to give me something.  She also complains that her perineum area feels full and numb.  She is having difficulty with her bowel movements although she makes a normal stool when she has to go she feels like she cannot hold  Examination shows that she has 80 degrees of abduction in her right shoulder actively with passive range of motion similar  She does not complain of radicular symptoms below the pelvis but she has severe tenderness in her lower back across the belt line    ASSESSMENT AND PLAN Prior CT scan shows the fractures as noted above  Recommend MRI of the lumbar spine  She could have some sacral nerve injuries  Prescription medication for hydrocodone   Consult orthospine or neurosurgery  Follow-up 6 weeks after therapy right shoulder  Meds ordered this encounter  Medications    HYDROcodone -acetaminophen  (NORCO/VICODIN) 5-325 MG tablet    Sig: Take 1 tablet by mouth every 6 (six) hours as needed for moderate pain (pain score 4-6).    Dispense:  30 tablet    Refill:  0

## 2025-01-02 NOTE — Progress Notes (Signed)
" ° ° °  01/02/2025   Chief Complaint  Patient presents with   Shoulder Injury    Right / Oct 15th 2025    Tailbone Pain    Has a lot of pain back/ sacral/ pelvic fractures Oct 03 2024   Back Pain    Lumbar pain Oct 03 2024    No diagnosis found.  What pharmacy do you use ? _____[facility]__  DOI/DOS/ Date:    Did you get better, worse or no change (Answer below)   Improved slightly but having a lot of pain Working with therapy        "

## 2025-01-03 ENCOUNTER — Other Ambulatory Visit: Payer: Self-pay | Admitting: *Deleted

## 2025-01-03 ENCOUNTER — Telehealth: Payer: Self-pay | Admitting: *Deleted

## 2025-01-03 ENCOUNTER — Ambulatory Visit

## 2025-01-03 DIAGNOSIS — G8929 Other chronic pain: Secondary | ICD-10-CM

## 2025-01-03 DIAGNOSIS — M5459 Other low back pain: Secondary | ICD-10-CM | POA: Diagnosis not present

## 2025-01-03 DIAGNOSIS — R2689 Other abnormalities of gait and mobility: Secondary | ICD-10-CM

## 2025-01-03 DIAGNOSIS — D509 Iron deficiency anemia, unspecified: Secondary | ICD-10-CM

## 2025-01-03 DIAGNOSIS — M25611 Stiffness of right shoulder, not elsewhere classified: Secondary | ICD-10-CM

## 2025-01-03 NOTE — Therapy (Signed)
 " OUTPATIENT PHYSICAL THERAPY SHOULDER/ PELVIS   Patient Name: Jody Wilson MRN: [id number] DOB:18-Jan-1951, 74 y.o., female Today's Date: 01/03/2025  END OF SESSION:  PT End of Session - 01/03/25 1604     Visit Number 4    Number of Visits 12    Date for Recertification  02/06/25    PT Start Time 1600    PT Stop Time 1658    PT Time Calculation (min) 58 min    Activity Tolerance Patient tolerated treatment well    Behavior During Therapy [facility] for tasks assessed/performed          Past Medical History:  Diagnosis Date   Anemia    Anxiety    Aortic atherosclerosis    B12 deficiency 09/25/2019   Carotid artery disease    Depression    GERD (gastroesophageal reflux disease)    Hyperlipidemia    Hypertension    Orthostatic hypotension    Osteoporosis    Pulmonary nodule 02/09/2023   Incidental finding on chest x-ray 02/09/2023- Nodule in the RIGHT upper lobe measuring approximately 7 mm may be new from previous chest radiographs though could be a portion of scarring that is seen in the upper chest. Consider short interval PA and lateral chest radiograph when the patient is able. If persistent CT may be warranted.  - F/u as outpatient     Past Surgical History:  Procedure Laterality Date   ABDOMINAL HYSTERECTOMY     BACK SURGERY     CHOLECYSTECTOMY     FOOT SURGERY     7 in the past   HIP ARTHROPLASTY Right 02/11/2023   Procedure: ARTHROPLASTY BIPOLAR HIP (HEMIARTHROPLASTY);  Surgeon: Onesimo Oneil LABOR, MD;  Location: [facility];  Service: Orthopedics;  Laterality: Right;   HIP ARTHROPLASTY Left 04/13/2024   Procedure: HEMIARTHROPLASTY (BIPOLAR) HIP, DIRECT LATERAL  FOR FRACTURE;  Surgeon: Margrette Taft BRAVO, MD;  Location: [facility];  Service: Orthopedics;  Laterality: Left;   IR GENERIC HISTORICAL  12/09/2016   IR RADIOLOGIST EVAL & MGMT 12/09/2016 [facility]   IR GENERIC HISTORICAL  12/10/2016   IR VERTEBROPLASTY CERV/THOR BX INC UNI/BIL INC/INJECT/IMAGING 12/10/2016  [facility]   LEFT HEART CATH AND CORONARY ANGIOGRAPHY N/A 09/30/2020   Procedure: LEFT HEART CATH AND CORONARY ANGIOGRAPHY;  Surgeon: Elmira Newman PARAS, MD;  Location: [facility];  Service: Cardiovascular;  Laterality: N/A;   LEFT HEART CATH AND CORONARY ANGIOGRAPHY N/A 06/27/2024   Procedure: LEFT HEART CATH AND CORONARY ANGIOGRAPHY;  Surgeon: Verlin Lonni BIRCH, MD;  Location: [facility];  Service: Cardiovascular;  Laterality: N/A;   TRACHEOSTOMY TUBE PLACEMENT  1982   Patient Active Problem List   Diagnosis Date Noted   Polypharmacy 10/30/2024   Muscle weakness 10/10/2024   Recurrent major depression 10/10/2024   History of non-ST elevation myocardial infarction (NSTEMI) 06/29/2024   Abnormal CT of liver 06/26/2024   Pulmonary nodule 02/09/2023   Hypertriglyceridemia 01/22/2022   Abnormal findings on diagnostic imaging of liver and biliary tract 12/09/2021   History of colonic polyps 12/09/2021   Anxiety 06/23/2020   Hiatal hernia 06/23/2020   Thoracic aortic atherosclerosis 06/23/2020   Involuntary muscle jerks while sleeping 09/26/2019   B12 deficiency 09/25/2019   Iron deficiency anemia 09/25/2019   Osteoporosis 02/09/2017   History of vertebral fracture 02/09/2017   GERD (gastroesophageal reflux disease) 03/11/2016   Hyperlipidemia 02/19/2015   Essential hypertension 03/18/2014    REFERRING PROVIDER: Taft Margrette MD  REFERRING DIAG: Fx proximal end  of right humerus.  Closed fx of transverse lumbar processes and right pubis.    THERAPY DIAG:  Other low back pain  Chronic right shoulder pain  Stiffness of right shoulder, not elsewhere classified  Other abnormalities of gait and mobility  Rationale for Evaluation and Treatment: Rehabilitation  ONSET DATE: 10/03/24  SUBJECTIVE:                                                                                                                                                                                       SUBJECTIVE STATEMENT: Pt reports 6/10 right shoulder pain and >10/10 low back pain.   PERTINENT HISTORY: OP, bilateral hip surgery.  Please see above.    PAIN:  Are you having pain? Yes: NPRS scale: 6/10.   Pain location: Low back and right shoulder. Pain description: As above.   Aggravating factors: Walking. Relieving factors: Nothing.  PRECAUTIONS: Other: Please supervise gait.    RED FLAGS: None   WEIGHT BEARING RESTRICTIONS: No  FALLS:  Has patient fallen in last 6 months? Yes. Number of falls 1.    LIVING ENVIRONMENT: Lives with: lives alone Lives in: House/apartment Has following equipment at home: Single point cane  OCCUPATION: Retired.    PLOF: Independent  PATIENT GOALS:  Regain use of right UE and walk better.    NEXT MD VISIT:   OBJECTIVE:  Note: Objective measures were completed at Evaluation unless otherwise noted.  DIAGNOSTIC FINDINGS:  11/19/24:  Callus is seen around the proximal humerus fracture which was noted several weeks before.  It is minimally displaced with slight angulation.  The patient is noted to have a previous cervical fusion   Impression healed right proximal humerus fracture  10/29/24:  IMPRESSION: 1. Subacute fractures of the right L4 and bilateral L5 transverse processes, with progressive displacement compared to prior examinations. No evidence of vertebral body fracture. 2. Progressive displacement of previously demonstrated bilateral sacral fractures, incompletely visualized. 3. Multilevel spondylosis as detailed above. No high-grade spinal stenosis or high-grade foraminal narrowing identified.  PATIENT SURVEYS:  LEFS:  7/80.  POSTURE: Flexed trunk.  Right ACJ malignment.  Forward head and rounded shoulder.    UPPER EXTREMITY ROM:   In supine:  Gentle P/AROM right shoulder flexion to 95 degrees.  ER is -15 degrees.    UPPER/LOWER EXTREMITY MMT:  Via manual muscle testing she was able to  provide a bilateral quad strength of 4+/5.  Right UE NT.  PALPATION:  Very tender to palpation over right proximal humeral/middle deltoid region.  She is also very tender to palpation at L4-S1 and especially on the left in the region of the left SIJ region.  GAIT:     Antalgic gait with patient using a straight cane with a decrease in step and stride length.  FUNCTIONAL TESTS:   TUG:  23 seconds  Five time sit to stand:  30 seconds                                                                                                                             TREATMENT DATE:    1/15//26                                  EXERCISE LOG  Exercise Repetitions and Resistance Comments  Nustep Lvl 2 x 15 mins   Pulleys 6 mins   UE Ranger 6 mins   PROM Flex and ER        Blank cell = exercise not performed today   Modalities  Date:  Unattended Estim: Lumbar, IFC 80-150 Hz, 15 mins, Pain and Tone Hot Pack: Lumbar, 15 mins, Pain and Tone    01/01/25:  Nustep level 2 x 15 minutes f/b pulleys x 5 minutes f/b seated UE ranger x 6 minutes f/b gentle PROM x 12 minutes to patient's right shoulder f/b HMP x 10 minutes.    12/28/24: Nustep L3  12 mins  Pulleys  x 3 mins Seated ranger x 3 mins Seated Punch 2x10  Supine elevation Seated marching  2 x 20 LAQs 2# 2x10 pause at top  HEP for table top UE exs and seated LE exs  Manual PROM and AAROM for elevation and ER  RT shldr             PATIENT EDUCATION: Education details:  Person educated:  International aid/development worker:  Education comprehension:   HOME EXERCISE PROGRAM:   ASSESSMENT:  CLINICAL IMPRESSION: Pt arrives for today's treatment session reporting 6/10 right shoulder pain and >10/10 low back pain.  Pt would like to have right shoulder functional so that she can get back taken of.  Normal responses to estim and Mh noted upon removal.  Pt reported decreased right shoulder pain at completion of today's treatment session.    OBJECTIVE IMPAIRMENTS: Abnormal gait, decreased activity tolerance, difficulty walking, decreased ROM, increased muscle spasms, and pain.   ACTIVITY LIMITATIONS: carrying, lifting, bending, sitting, standing, stairs, bed mobility, and locomotion level  PARTICIPATION LIMITATIONS: meal prep, cleaning, laundry, driving, shopping, occupation, and yard work  PERSONAL FACTORS: Time since onset of injury/illness/exacerbation and 1 comorbidity: bilateral hip surgeries are also affecting patient's functional outcome.   REHAB POTENTIAL: Good(-)/Good.  CLINICAL DECISION MAKING: Evolving/moderate complexity  EVALUATION COMPLEXITY: Moderate   GOALS:  SHORT TERM GOALS: Target date: 01/09/25  Ind with an initial HEP. Goal status: INITIAL   LONG TERM GOALS: Target date: 02/06/25  Ind with an advanced HEP.  Goal status: INITIAL  2.  Improve TUG by 5 seconds.  Goal status: INITIAL  3.  Improve  time sit to stand test by 5 seconds.  Goal status: INITIAL  4.  Patient walk without antalgia.  Goal status: INITIAL  5.  Active right shoulder flexion to 125-130 degrees so the patient can easily reach overhead.  Goal status: INITIAL  6.  Active ER to 70 degrees+ to allow for easily donning/doffing of apparel. Goal status: INITIAL   7.  Perform ADL's with pain not > 4/10.    Goal status: INITIAL PLAN:  PT FREQUENCY: 2x/week  PT DURATION: 6 weeks  PLANNED INTERVENTIONS: 97110-Therapeutic exercises, 97530- Therapeutic activity, [id number]- Neuromuscular re-education, 97535- Self Care, [id number]- Manual therapy, G0283- Electrical stimulation (unattended), Patient/Family education, and Moist heat  PLAN FOR NEXT SESSION: Nustep, pulleys, seated UE Ranger.  P, P/AROM to right shoulder.  Gait training.     Delon DELENA Gosling, PTA 01/03/2025, 5:01 PM  "

## 2025-01-03 NOTE — Telephone Encounter (Signed)
 The orders have been signed. They are now in EPIC. I contacted patient. Lab appointment is scheduled. Her appointment for the cancer center is Thursday 012/22/2026. There should be plenty of time to get results prior to visit

## 2025-01-03 NOTE — Telephone Encounter (Signed)
 Contacted [facility] and left a message to please print patients [facility] orders and fax them to [facility] at [phone].    Copied from CRM [id number]. Topic: Clinical - Request for Lab/Test Order >> Jan 03, 2025  9:49 AM Cherylann RAMAN wrote: Reason for CRM: Patient is states that her labs were sent over to the [facility] at [facility] and Ms. Joesph stated that if she had any issues to call the office. Reached out to [facility] and spoke with Rilla and she did not see the labs and she tx'd me to the lab and I spoke with LJ and she states that she did not see any orders. Informed the patient and she states that Ms. Joesph states if orders were not found to have the Lab to contact her at [phone] or at the [facility] Ph:[phone] >> Jan 03, 2025 11:39 AM Susanna ORN wrote: Patient called back to check if lab orders had been placed. Informed her that there's still no orders yet. Called [facility] & spoke with Rilla and she stated they have nothing and she then transferred me to the lab. While speaking with Danielle in the lab, the call dropped. Danielle stated they have no orders for the patient. Patient asked earlier if she comes tomorrow, would the results make it to her doctor before the appt on the 21st. Danielle stated that they would not because the lab is offsite and they wouldn't make it to the lab until around 2 PM or later. Edsel stated that patient could possibly do the labs at the hospital or go to a different lab. Tried contacting patient twice to provide this information but call went straight to the voicemail. Left VM.  >> Jan 03, 2025 11:33 AM Farrel B wrote: Pt. Requesting a call back to update her on those lab order

## 2025-01-04 ENCOUNTER — Other Ambulatory Visit

## 2025-01-04 DIAGNOSIS — D509 Iron deficiency anemia, unspecified: Secondary | ICD-10-CM

## 2025-01-04 LAB — IRON AND TIBC
Iron Saturation: 23 % (ref 15–55)
Iron: 55 ug/dL (ref 27–139)
Total Iron Binding Capacity: 239 ug/dL — ABNORMAL LOW (ref 250–450)
UIBC: 184 ug/dL (ref 118–369)

## 2025-01-04 LAB — CBC WITH DIFFERENTIAL/PLATELET
Basophils Absolute: 0 x10E3/uL (ref 0.0–0.2)
Basos: 1 %
EOS (ABSOLUTE): 0.2 x10E3/uL (ref 0.0–0.4)
Eos: 2 %
Hematocrit: 43.9 % (ref 34.0–46.6)
Hemoglobin: 14.3 g/dL (ref 11.1–15.9)
Immature Grans (Abs): 0 x10E3/uL (ref 0.0–0.1)
Immature Granulocytes: 0 %
Lymphocytes Absolute: 1.8 x10E3/uL (ref 0.7–3.1)
Lymphs: 23 %
MCH: 33.4 pg — ABNORMAL HIGH (ref 26.6–33.0)
MCHC: 32.6 g/dL (ref 31.5–35.7)
MCV: 103 fL — ABNORMAL HIGH (ref 79–97)
Monocytes Absolute: 0.5 x10E3/uL (ref 0.1–0.9)
Monocytes: 6 %
Neutrophils Absolute: 5.2 x10E3/uL (ref 1.4–7.0)
Neutrophils: 68 %
Platelets: 344 x10E3/uL (ref 150–450)
RBC: 4.28 x10E6/uL (ref 3.77–5.28)
RDW: 11.7 % (ref 11.7–15.4)
WBC: 7.7 x10E3/uL (ref 3.4–10.8)

## 2025-01-04 LAB — COMPREHENSIVE METABOLIC PANEL WITH GFR
ALT: 8 IU/L (ref 0–32)
AST: 10 IU/L (ref 0–40)
Albumin: 4.2 g/dL (ref 3.8–4.8)
Alkaline Phosphatase: 160 IU/L — ABNORMAL HIGH (ref 49–135)
BUN/Creatinine Ratio: 20 (ref 12–28)
BUN: 14 mg/dL (ref 8–27)
Bilirubin Total: 0.3 mg/dL (ref 0.0–1.2)
CO2: 23 mmol/L (ref 20–29)
Calcium: 9.8 mg/dL (ref 8.7–10.3)
Chloride: 101 mmol/L (ref 96–106)
Creatinine, Ser: 0.69 mg/dL (ref 0.57–1.00)
Globulin, Total: 2.4 g/dL (ref 1.5–4.5)
Glucose: 92 mg/dL (ref 70–99)
Potassium: 4 mmol/L (ref 3.5–5.2)
Sodium: 140 mmol/L (ref 134–144)
Total Protein: 6.6 g/dL (ref 6.0–8.5)
eGFR: 92 mL/min/1.73

## 2025-01-04 LAB — FERRITIN: Ferritin: 112 ng/mL (ref 15–150)

## 2025-01-07 ENCOUNTER — Other Ambulatory Visit

## 2025-01-09 ENCOUNTER — Ambulatory Visit: Admitting: Physical Therapy

## 2025-01-10 ENCOUNTER — Ambulatory Visit (HOSPITAL_COMMUNITY)
Admission: RE | Admit: 2025-01-10 | Discharge: 2025-01-10 | Disposition: A | Discharge status: Home / Self Care | Source: Ambulatory Visit | Attending: Orthopedic Surgery | Admitting: Orthopedic Surgery

## 2025-01-10 ENCOUNTER — Inpatient Hospital Stay: Attending: Oncology | Admitting: Oncology

## 2025-01-10 VITALS — BP 138/85 | HR 68 | Temp 98.2°F | Resp 18 | Ht 65.0 in | Wt 125.0 lb

## 2025-01-10 DIAGNOSIS — R55 Syncope and collapse: Secondary | ICD-10-CM | POA: Diagnosis not present

## 2025-01-10 DIAGNOSIS — R7989 Other specified abnormal findings of blood chemistry: Secondary | ICD-10-CM | POA: Diagnosis not present

## 2025-01-10 DIAGNOSIS — S32009D Unspecified fracture of unspecified lumbar vertebra, subsequent encounter for fracture with routine healing: Secondary | ICD-10-CM | POA: Insufficient documentation

## 2025-01-10 DIAGNOSIS — E559 Vitamin D deficiency, unspecified: Secondary | ICD-10-CM | POA: Insufficient documentation

## 2025-01-10 DIAGNOSIS — D508 Other iron deficiency anemias: Secondary | ICD-10-CM | POA: Diagnosis not present

## 2025-01-10 DIAGNOSIS — E538 Deficiency of other specified B group vitamins: Secondary | ICD-10-CM

## 2025-01-10 DIAGNOSIS — Z122 Encounter for screening for malignant neoplasm of respiratory organs: Secondary | ICD-10-CM

## 2025-01-10 DIAGNOSIS — Z87891 Personal history of nicotine dependence: Secondary | ICD-10-CM | POA: Diagnosis not present

## 2025-01-10 DIAGNOSIS — D509 Iron deficiency anemia, unspecified: Secondary | ICD-10-CM

## 2025-01-10 DIAGNOSIS — S3210XD Unspecified fracture of sacrum, subsequent encounter for fracture with routine healing: Secondary | ICD-10-CM | POA: Insufficient documentation

## 2025-01-10 DIAGNOSIS — D751 Secondary polycythemia: Secondary | ICD-10-CM

## 2025-01-10 NOTE — Assessment & Plan Note (Addendum)
 Recommend low-dose CT screening program.  Orders are placed.  Will reschedule.

## 2025-01-10 NOTE — Assessment & Plan Note (Deleted)
 Reviewed iron levels.  Iron saturations have dropped to 17%.  Ferritin is 26. She last received IV Feraheme  back in 2020 and 03/17/2020. We discussed giving her 2 doses of IV Feraheme  to see if this helps her energy levels and fatigue. Hemoglobin is stable at 14. Return to clinic in 3 months or so for labs and virtual visit.

## 2025-01-10 NOTE — Assessment & Plan Note (Addendum)
 Reviewed iron levels.  Iron saturations is 23% with a ferritin of 112.  TIBC is low.  Hemoglobin is stable at 14.3.  Differential is unremarkable. She last received IV Feraheme  back in 2020 and 03/17/2020. On previous visits, it was recommended she receive 2 doses of IV Feraheme  but it looks like that was never done. She is unable to tolerate iron tablets. We discussed giving her a dose of IV iron to see if that helps her energy levels. In 6 months with labs a few days before.

## 2025-01-10 NOTE — Progress Notes (Signed)
 "  [facility] OFFICE PROGRESS NOTE  Dettinger, Fonda LABOR, MD  ASSESSMENT & PLAN:  Assessment & Plan Other iron deficiency anemia Reviewed iron levels.  Iron saturations is 23% with a ferritin of 112.  TIBC is low.  Hemoglobin is stable at 14.3.  Differential is unremarkable. She last received IV Feraheme  back in 2020 and 03/17/2020. On previous visits, it was recommended she receive 2 doses of IV Feraheme  but it looks like that was never done. She is unable to tolerate iron tablets. We discussed giving her a dose of IV iron to see if that helps her energy levels. In 6 months with labs a few days before. Vitamin D  deficiency No recent B12 levels. B12 levels from 07/06/2024 were low at 391. Continue B12 supplements.  We will recheck these in 3 months. Syncope, unspecified syncope type No recent syncopal episodes but she has had several falls. She is currently followed closely by orthopedics for several fractures and undergoing physical therapy 2 times per week. She uses a walker for ambulation. Smoking history Patient had low-dose CT scan on 10/01/2024 which was lung RADS 2 benign appearance or behavior.  Recommend follow-up in 12 months.  Will place orders at her next visit.  Orders Placed This Encounter  Procedures   CT CHEST LUNG CANCER SCREENING LOW DOSE WO CONTRAST    Standing Status:   Future    Expected Date:   10/19/2025    Expiration Date:   01/10/2026    Preferred Imaging Location?:   [facility]    Release to patient:   Immediate    Release to patient:   Immediate [1]   CBC with Differential/Platelet    Standing Status:   Future    Expected Date:   07/11/2025    Expiration Date:   10/09/2025    Release to patient:   Immediate   Iron and TIBC    Standing Status:   Future    Expected Date:   07/11/2025    Expiration Date:   10/09/2025   Comprehensive metabolic panel with GFR    Standing Status:   Future    Expected Date:   07/11/2025    Expiration  Date:   10/09/2025    Release to patient:   Immediate   Ferritin    Standing Status:   Future    Expected Date:   07/11/2025    Expiration Date:   10/09/2025    Release to patient:   Immediate   Magnesium     Standing Status:   Future    Expected Date:   07/11/2025    Expiration Date:   10/09/2025   Folate    Standing Status:   Future    Expected Date:   07/11/2025    Expiration Date:   10/09/2025    Release to patient:   Immediate   Vitamin D  25 hydroxy    Standing Status:   Future    Expected Date:   07/11/2025    Expiration Date:   10/09/2025    INTERVAL HISTORY: Patient returns for erythrocytosis, iron deficiency, low B12 levels and several falls resulting in numerous fractures.  She is followed closely by orthopedics.  In the interim, she was hospitalized on several occasions: 10/03/2024 through 10/10/2024 for fall and suffered closed fracture of proximal and right humerus.  Ortho recommended conservative management.  She was discharged to a SNF and received PT.  She was seen again on 10/16/2024 for altered mental status.  She was given IV fluids and electrolyte correction with improvement of her symptoms.  CT chest/abdomen/pelvis showed postinflammatory versus atypical pneumonia and she was treated with antibiotics.  She was admitted again on 10/29/2024 for altered mental status thought to be due to narcotics.  She was evaluated on 11/17/2024 for tremors, chest pain and nausea.  Symptoms resolved.  Patient reports her husband passed away on December 16, 2024.  This has been a huge adjustment for her.  She is followed by orthopedics for closed fracture of sacrum, transverse process of lumbar vertebrae, closed nondisplaced fracture of humerus right side and right pubis.   She is currently receiving physical therapy twice per week.  Reports she recently developed severe low back pain over the past couple of weeks.  She is scheduled for an MRI today.  Reports she gives herself B12 shots  every 28 days at home.  She is currently taking vitamin D  supplements 1/day.  Appetite and energy levels are 50%.  She continues to smoke.   We reviewed iron panel, vitamin B12, vitamin D , folate, MMA, CMP and CBC.  SUMMARY OF HEMATOLOGIC HISTORY: Patient is a 74 year old female with history significant for hypertension, hyperlipidemia, anxiety and depression, syncope, acute respiratory failure with hypoxia, non-STEMI.  She has received IV iron and B12 intermittently.     No results found for: CBC  Vitals:   01/10/25 1132 01/10/25 1133  BP: (!) 149/68 138/85  Pulse: 68   Resp: 18   Temp: 98.2 F (36.8 C)   SpO2: 99%     CBC    Component Value Date/Time   WBC 7.7 01/04/2025 1108   WBC 7.2 11/17/2024 1428   RBC 4.28 01/04/2025 1108   RBC 4.05 11/17/2024 1428   HGB 14.3 01/04/2025 1108   HCT 43.9 01/04/2025 1108   PLT 344 01/04/2025 1108   MCV 103 (H) 01/04/2025 1108   MCH 33.4 (H) 01/04/2025 1108   MCH 33.8 11/17/2024 1428   MCHC 32.6 01/04/2025 1108   MCHC 33.1 11/17/2024 1428   RDW 11.7 01/04/2025 1108   LYMPHSABS 1.8 01/04/2025 1108   MONOABS 1.3 (H) 10/16/2024 1616   EOSABS 0.2 01/04/2025 1108   BASOSABS 0.0 01/04/2025 1108   Review of Systems  Constitutional:  Positive for malaise/fatigue.  Genitourinary:  Positive for dysuria and frequency.  Musculoskeletal:  Positive for back pain.  Psychiatric/Behavioral:  Positive for depression and memory loss. The patient is nervous/anxious.    Physical Exam Constitutional:      Appearance: Normal appearance.  Cardiovascular:     Rate and Rhythm: Normal rate and regular rhythm.  Pulmonary:     Effort: Pulmonary effort is normal.     Breath sounds: Normal breath sounds.  Abdominal:     General: Bowel sounds are normal.     Palpations: Abdomen is soft.  Musculoskeletal:        General: No swelling. Normal range of motion.  Neurological:     Mental Status: She is alert and oriented to person, place, and time.  Mental status is at baseline.      I spent 30 minutes dedicated to the care of this patient (face-to-face and non-face-to-face) on the date of the encounter to include what is described in the assessment and plan.,  Delon Hope, NP 01/11/2025 8:08 AM "

## 2025-01-11 ENCOUNTER — Encounter (HOSPITAL_COMMUNITY): Payer: Self-pay | Admitting: Oncology

## 2025-01-11 ENCOUNTER — Ambulatory Visit: Admitting: Physical Therapy

## 2025-01-11 NOTE — Assessment & Plan Note (Signed)
 No recent B12 levels. B12 levels from 07/06/2024 were low at 391. Continue B12 supplements.  We will recheck these in 3 months.

## 2025-01-11 NOTE — Assessment & Plan Note (Signed)
 No recent syncopal episodes but she has had several falls. She is currently followed closely by orthopedics for several fractures and undergoing physical therapy 2 times per week. She uses a walker for ambulation.

## 2025-01-15 ENCOUNTER — Other Ambulatory Visit: Payer: Self-pay | Admitting: *Deleted

## 2025-01-15 DIAGNOSIS — R7989 Other specified abnormal findings of blood chemistry: Secondary | ICD-10-CM

## 2025-01-15 MED ORDER — CYANOCOBALAMIN 1000 MCG/ML IJ SOLN: 1000.0000 ug | INTRAMUSCULAR | 6 refills | Status: AC

## 2025-01-18 ENCOUNTER — Inpatient Hospital Stay

## 2025-01-25 ENCOUNTER — Inpatient Hospital Stay

## 2025-01-29 ENCOUNTER — Inpatient Hospital Stay

## 2025-02-07 ENCOUNTER — Encounter: Admitting: Orthopedic Surgery

## 2025-02-08 ENCOUNTER — Ambulatory Visit: Admitting: Family Medicine

## 2025-02-13 ENCOUNTER — Ambulatory Visit: Admitting: Orthopedic Surgery

## 2025-07-03 ENCOUNTER — Inpatient Hospital Stay

## 2025-07-10 ENCOUNTER — Inpatient Hospital Stay: Admitting: Oncology
